# Patient Record
Sex: Female | Born: 1980 | Race: Black or African American | Hispanic: No | Smoking: Never smoker
Health system: Southern US, Community
[De-identification: ages and names within clinical notes are randomized; demographics above are authoritative.]

## PROBLEM LIST (undated history)

## (undated) DIAGNOSIS — E0590 Thyrotoxicosis, unspecified without thyrotoxic crisis or storm: Secondary | ICD-10-CM

## (undated) DIAGNOSIS — K219 Gastro-esophageal reflux disease without esophagitis: Secondary | ICD-10-CM

## (undated) DIAGNOSIS — M1990 Unspecified osteoarthritis, unspecified site: Secondary | ICD-10-CM

## (undated) DIAGNOSIS — Z202 Contact with and (suspected) exposure to infections with a predominantly sexual mode of transmission: Secondary | ICD-10-CM

## (undated) DIAGNOSIS — Z789 Other specified health status: Secondary | ICD-10-CM

## (undated) DIAGNOSIS — Z9221 Personal history of antineoplastic chemotherapy: Secondary | ICD-10-CM

## (undated) DIAGNOSIS — F419 Anxiety disorder, unspecified: Secondary | ICD-10-CM

## (undated) DIAGNOSIS — C50919 Malignant neoplasm of unspecified site of unspecified female breast: Secondary | ICD-10-CM

## (undated) DIAGNOSIS — T7840XA Allergy, unspecified, initial encounter: Secondary | ICD-10-CM

## (undated) DIAGNOSIS — Z923 Personal history of irradiation: Secondary | ICD-10-CM

## (undated) HISTORY — DX: Unspecified osteoarthritis, unspecified site: M19.90

## (undated) HISTORY — DX: Allergy, unspecified, initial encounter: T78.40XA

## (undated) HISTORY — DX: Malignant neoplasm of unspecified site of unspecified female breast: C50.919

## (undated) HISTORY — DX: Thyrotoxicosis, unspecified without thyrotoxic crisis or storm: E05.90

## (undated) HISTORY — DX: Anxiety disorder, unspecified: F41.9

## (undated) HISTORY — DX: Gastro-esophageal reflux disease without esophagitis: K21.9

## (undated) HISTORY — PX: TUBAL LIGATION: SHX77

---
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
## 1999-02-06 ENCOUNTER — Emergency Department (HOSPITAL_COMMUNITY): Admission: EM | Admit: 1999-02-06 | Discharge: 1999-02-07 | Payer: Self-pay | Admitting: *Deleted

## 1999-03-28 ENCOUNTER — Emergency Department (HOSPITAL_COMMUNITY): Admission: EM | Admit: 1999-03-28 | Discharge: 1999-03-28 | Payer: Self-pay | Admitting: Emergency Medicine

## 1999-05-09 ENCOUNTER — Emergency Department (HOSPITAL_COMMUNITY): Admission: EM | Admit: 1999-05-09 | Discharge: 1999-05-09 | Payer: Self-pay | Admitting: Emergency Medicine

## 1999-10-31 ENCOUNTER — Inpatient Hospital Stay (HOSPITAL_COMMUNITY): Admission: AD | Admit: 1999-10-31 | Discharge: 1999-10-31 | Payer: Self-pay | Admitting: Obstetrics

## 1999-11-27 ENCOUNTER — Inpatient Hospital Stay (HOSPITAL_COMMUNITY): Admission: AD | Admit: 1999-11-27 | Discharge: 1999-11-27 | Payer: Self-pay | Admitting: *Deleted

## 1999-12-09 ENCOUNTER — Ambulatory Visit (HOSPITAL_COMMUNITY): Admission: RE | Admit: 1999-12-09 | Discharge: 1999-12-09 | Payer: Self-pay | Admitting: *Deleted

## 1999-12-09 ENCOUNTER — Encounter: Payer: Self-pay | Admitting: *Deleted

## 2000-01-06 ENCOUNTER — Inpatient Hospital Stay (HOSPITAL_COMMUNITY): Admission: AD | Admit: 2000-01-06 | Discharge: 2000-01-06 | Payer: Self-pay | Admitting: *Deleted

## 2000-01-10 ENCOUNTER — Inpatient Hospital Stay (HOSPITAL_COMMUNITY): Admission: AD | Admit: 2000-01-10 | Discharge: 2000-01-10 | Payer: Self-pay | Admitting: *Deleted

## 2000-01-20 ENCOUNTER — Observation Stay (HOSPITAL_COMMUNITY): Admission: AD | Admit: 2000-01-20 | Discharge: 2000-01-21 | Payer: Self-pay | Admitting: Obstetrics & Gynecology

## 2000-02-22 ENCOUNTER — Inpatient Hospital Stay (HOSPITAL_COMMUNITY): Admission: AD | Admit: 2000-02-22 | Discharge: 2000-02-22 | Payer: Self-pay | Admitting: *Deleted

## 2000-03-26 ENCOUNTER — Inpatient Hospital Stay (HOSPITAL_COMMUNITY): Admission: AD | Admit: 2000-03-26 | Discharge: 2000-03-28 | Payer: Self-pay | Admitting: *Deleted

## 2000-08-21 ENCOUNTER — Emergency Department (HOSPITAL_COMMUNITY): Admission: EM | Admit: 2000-08-21 | Discharge: 2000-08-21 | Payer: Self-pay | Admitting: Emergency Medicine

## 2000-12-11 ENCOUNTER — Emergency Department (HOSPITAL_COMMUNITY): Admission: EM | Admit: 2000-12-11 | Discharge: 2000-12-12 | Payer: Self-pay | Admitting: Emergency Medicine

## 2001-03-06 ENCOUNTER — Encounter: Payer: Self-pay | Admitting: Emergency Medicine

## 2001-03-06 ENCOUNTER — Emergency Department (HOSPITAL_COMMUNITY): Admission: EM | Admit: 2001-03-06 | Discharge: 2001-03-06 | Payer: Self-pay | Admitting: Emergency Medicine

## 2001-06-12 HISTORY — PX: TUBAL LIGATION: SHX77

## 2001-07-10 ENCOUNTER — Inpatient Hospital Stay (HOSPITAL_COMMUNITY): Admission: AD | Admit: 2001-07-10 | Discharge: 2001-07-10 | Payer: Self-pay | Admitting: *Deleted

## 2001-08-14 ENCOUNTER — Inpatient Hospital Stay (HOSPITAL_COMMUNITY): Admission: AD | Admit: 2001-08-14 | Discharge: 2001-08-14 | Payer: Self-pay | Admitting: *Deleted

## 2001-09-11 ENCOUNTER — Ambulatory Visit (HOSPITAL_COMMUNITY): Admission: RE | Admit: 2001-09-11 | Discharge: 2001-09-11 | Payer: Self-pay | Admitting: *Deleted

## 2001-09-18 ENCOUNTER — Inpatient Hospital Stay (HOSPITAL_COMMUNITY): Admission: AD | Admit: 2001-09-18 | Discharge: 2001-09-18 | Payer: Self-pay | Admitting: Obstetrics and Gynecology

## 2001-10-29 ENCOUNTER — Encounter: Payer: Self-pay | Admitting: *Deleted

## 2001-10-29 ENCOUNTER — Observation Stay (HOSPITAL_COMMUNITY): Admission: AD | Admit: 2001-10-29 | Discharge: 2001-10-30 | Payer: Self-pay | Admitting: *Deleted

## 2001-10-30 ENCOUNTER — Encounter: Payer: Self-pay | Admitting: Family Medicine

## 2001-11-19 ENCOUNTER — Inpatient Hospital Stay (HOSPITAL_COMMUNITY): Admission: AD | Admit: 2001-11-19 | Discharge: 2001-11-19 | Payer: Self-pay | Admitting: *Deleted

## 2001-11-28 ENCOUNTER — Inpatient Hospital Stay (HOSPITAL_COMMUNITY): Admission: AD | Admit: 2001-11-28 | Discharge: 2001-11-28 | Payer: Self-pay | Admitting: *Deleted

## 2001-12-09 ENCOUNTER — Inpatient Hospital Stay (HOSPITAL_COMMUNITY): Admission: AD | Admit: 2001-12-09 | Discharge: 2001-12-09 | Payer: Self-pay | Admitting: Obstetrics and Gynecology

## 2002-01-02 ENCOUNTER — Inpatient Hospital Stay (HOSPITAL_COMMUNITY): Admission: AD | Admit: 2002-01-02 | Discharge: 2002-01-02 | Payer: Self-pay | Admitting: Obstetrics and Gynecology

## 2002-01-12 ENCOUNTER — Inpatient Hospital Stay (HOSPITAL_COMMUNITY): Admission: AD | Admit: 2002-01-12 | Discharge: 2002-01-12 | Payer: Self-pay | Admitting: *Deleted

## 2002-01-24 ENCOUNTER — Inpatient Hospital Stay (HOSPITAL_COMMUNITY): Admission: AD | Admit: 2002-01-24 | Discharge: 2002-01-24 | Payer: Self-pay | Admitting: *Deleted

## 2002-01-28 ENCOUNTER — Inpatient Hospital Stay (HOSPITAL_COMMUNITY): Admission: AD | Admit: 2002-01-28 | Discharge: 2002-01-30 | Payer: Self-pay | Admitting: *Deleted

## 2002-01-28 ENCOUNTER — Encounter (INDEPENDENT_AMBULATORY_CARE_PROVIDER_SITE_OTHER): Payer: Self-pay | Admitting: Specialist

## 2002-06-10 ENCOUNTER — Emergency Department (HOSPITAL_COMMUNITY): Admission: EM | Admit: 2002-06-10 | Discharge: 2002-06-10 | Payer: Self-pay | Admitting: Emergency Medicine

## 2002-06-10 ENCOUNTER — Encounter: Payer: Self-pay | Admitting: Emergency Medicine

## 2002-06-27 ENCOUNTER — Emergency Department (HOSPITAL_COMMUNITY): Admission: EM | Admit: 2002-06-27 | Discharge: 2002-06-27 | Payer: Self-pay | Admitting: Emergency Medicine

## 2003-01-05 ENCOUNTER — Inpatient Hospital Stay (HOSPITAL_COMMUNITY): Admission: AD | Admit: 2003-01-05 | Discharge: 2003-01-05 | Payer: Self-pay | Admitting: Obstetrics & Gynecology

## 2003-05-28 ENCOUNTER — Inpatient Hospital Stay (HOSPITAL_COMMUNITY): Admission: AD | Admit: 2003-05-28 | Discharge: 2003-05-28 | Payer: Self-pay | Admitting: Nephrology

## 2003-06-29 ENCOUNTER — Emergency Department (HOSPITAL_COMMUNITY): Admission: EM | Admit: 2003-06-29 | Discharge: 2003-06-29 | Payer: Self-pay | Admitting: Emergency Medicine

## 2003-07-31 ENCOUNTER — Emergency Department (HOSPITAL_COMMUNITY): Admission: EM | Admit: 2003-07-31 | Discharge: 2003-07-31 | Payer: Self-pay | Admitting: Emergency Medicine

## 2003-09-27 ENCOUNTER — Emergency Department (HOSPITAL_COMMUNITY): Admission: EM | Admit: 2003-09-27 | Discharge: 2003-09-27 | Payer: Self-pay | Admitting: Emergency Medicine

## 2003-12-16 ENCOUNTER — Emergency Department (HOSPITAL_COMMUNITY): Admission: EM | Admit: 2003-12-16 | Discharge: 2003-12-16 | Payer: Self-pay | Admitting: Emergency Medicine

## 2004-03-15 ENCOUNTER — Emergency Department (HOSPITAL_COMMUNITY): Admission: EM | Admit: 2004-03-15 | Discharge: 2004-03-15 | Payer: Self-pay | Admitting: Emergency Medicine

## 2004-07-10 ENCOUNTER — Emergency Department (HOSPITAL_COMMUNITY): Admission: EM | Admit: 2004-07-10 | Discharge: 2004-07-10 | Payer: Self-pay | Admitting: Family Medicine

## 2004-08-06 ENCOUNTER — Emergency Department (HOSPITAL_COMMUNITY): Admission: EM | Admit: 2004-08-06 | Discharge: 2004-08-06 | Payer: Self-pay | Admitting: Emergency Medicine

## 2005-01-10 ENCOUNTER — Emergency Department (HOSPITAL_COMMUNITY): Admission: EM | Admit: 2005-01-10 | Discharge: 2005-01-10 | Payer: Self-pay | Admitting: Emergency Medicine

## 2005-05-31 ENCOUNTER — Emergency Department (HOSPITAL_COMMUNITY): Admission: EM | Admit: 2005-05-31 | Discharge: 2005-05-31 | Payer: Self-pay | Admitting: Emergency Medicine

## 2006-01-20 ENCOUNTER — Emergency Department (HOSPITAL_COMMUNITY): Admission: EM | Admit: 2006-01-20 | Discharge: 2006-01-20 | Payer: Self-pay | Admitting: Emergency Medicine

## 2006-06-25 ENCOUNTER — Emergency Department (HOSPITAL_COMMUNITY): Admission: EM | Admit: 2006-06-25 | Discharge: 2006-06-25 | Payer: Self-pay | Admitting: Family Medicine

## 2006-10-03 ENCOUNTER — Emergency Department (HOSPITAL_COMMUNITY): Admission: EM | Admit: 2006-10-03 | Discharge: 2006-10-03 | Payer: Self-pay | Admitting: Family Medicine

## 2007-09-30 ENCOUNTER — Emergency Department (HOSPITAL_COMMUNITY): Admission: EM | Admit: 2007-09-30 | Discharge: 2007-09-30 | Payer: Self-pay | Admitting: Emergency Medicine

## 2007-10-14 ENCOUNTER — Emergency Department (HOSPITAL_COMMUNITY): Admission: EM | Admit: 2007-10-14 | Discharge: 2007-10-14 | Payer: Self-pay | Admitting: Emergency Medicine

## 2007-12-01 ENCOUNTER — Emergency Department (HOSPITAL_COMMUNITY): Admission: EM | Admit: 2007-12-01 | Discharge: 2007-12-01 | Payer: Self-pay | Admitting: Emergency Medicine

## 2007-12-23 ENCOUNTER — Inpatient Hospital Stay (HOSPITAL_COMMUNITY): Admission: EM | Admit: 2007-12-23 | Discharge: 2007-12-26 | Payer: Self-pay | Admitting: Emergency Medicine

## 2008-03-02 ENCOUNTER — Emergency Department (HOSPITAL_COMMUNITY): Admission: EM | Admit: 2008-03-02 | Discharge: 2008-03-02 | Payer: Self-pay | Admitting: Emergency Medicine

## 2008-07-17 ENCOUNTER — Emergency Department (HOSPITAL_COMMUNITY): Admission: EM | Admit: 2008-07-17 | Discharge: 2008-07-17 | Payer: Self-pay | Admitting: Emergency Medicine

## 2008-08-05 ENCOUNTER — Emergency Department (HOSPITAL_COMMUNITY): Admission: EM | Admit: 2008-08-05 | Discharge: 2008-08-05 | Payer: Self-pay | Admitting: Family Medicine

## 2008-08-10 ENCOUNTER — Emergency Department (HOSPITAL_COMMUNITY): Admission: EM | Admit: 2008-08-10 | Discharge: 2008-08-10 | Payer: Self-pay | Admitting: Emergency Medicine

## 2008-11-12 ENCOUNTER — Emergency Department (HOSPITAL_COMMUNITY): Admission: EM | Admit: 2008-11-12 | Discharge: 2008-11-12 | Payer: Self-pay | Admitting: Emergency Medicine

## 2008-12-22 ENCOUNTER — Emergency Department (HOSPITAL_COMMUNITY): Admission: EM | Admit: 2008-12-22 | Discharge: 2008-12-22 | Payer: Self-pay | Admitting: Emergency Medicine

## 2009-03-16 ENCOUNTER — Emergency Department (HOSPITAL_COMMUNITY): Admission: EM | Admit: 2009-03-16 | Discharge: 2009-03-16 | Payer: Self-pay | Admitting: Family Medicine

## 2009-05-14 ENCOUNTER — Emergency Department (HOSPITAL_COMMUNITY): Admission: EM | Admit: 2009-05-14 | Discharge: 2009-05-14 | Payer: Self-pay | Admitting: Emergency Medicine

## 2009-05-17 ENCOUNTER — Emergency Department (HOSPITAL_COMMUNITY): Admission: EM | Admit: 2009-05-17 | Discharge: 2009-05-17 | Payer: Self-pay | Admitting: Emergency Medicine

## 2009-06-02 ENCOUNTER — Emergency Department (HOSPITAL_COMMUNITY): Admission: EM | Admit: 2009-06-02 | Discharge: 2009-06-02 | Payer: Self-pay | Admitting: Emergency Medicine

## 2009-06-21 ENCOUNTER — Emergency Department (HOSPITAL_COMMUNITY): Admission: EM | Admit: 2009-06-21 | Discharge: 2009-06-21 | Payer: Self-pay | Admitting: Emergency Medicine

## 2009-07-10 ENCOUNTER — Emergency Department (HOSPITAL_COMMUNITY): Admission: EM | Admit: 2009-07-10 | Discharge: 2009-07-11 | Payer: Self-pay | Admitting: Emergency Medicine

## 2009-10-05 ENCOUNTER — Emergency Department (HOSPITAL_COMMUNITY): Admission: EM | Admit: 2009-10-05 | Discharge: 2009-10-05 | Payer: Self-pay | Admitting: Family Medicine

## 2009-10-18 ENCOUNTER — Emergency Department (HOSPITAL_COMMUNITY): Admission: EM | Admit: 2009-10-18 | Discharge: 2009-10-18 | Payer: Self-pay | Admitting: Emergency Medicine

## 2010-02-27 ENCOUNTER — Emergency Department (HOSPITAL_COMMUNITY): Admission: EM | Admit: 2010-02-27 | Discharge: 2010-02-28 | Payer: Self-pay | Admitting: Emergency Medicine

## 2010-06-16 ENCOUNTER — Emergency Department (HOSPITAL_COMMUNITY)
Admission: EM | Admit: 2010-06-16 | Discharge: 2010-06-16 | Payer: Self-pay | Source: Home / Self Care | Admitting: Family Medicine

## 2010-08-02 ENCOUNTER — Emergency Department (HOSPITAL_COMMUNITY)
Admission: EM | Admit: 2010-08-02 | Discharge: 2010-08-02 | Disposition: A | Discharge status: Home / Self Care | Payer: Medicaid Other | Attending: Emergency Medicine | Admitting: Emergency Medicine

## 2010-08-02 ENCOUNTER — Emergency Department (HOSPITAL_COMMUNITY): Payer: Medicaid Other

## 2010-08-02 DIAGNOSIS — K5289 Other specified noninfective gastroenteritis and colitis: Secondary | ICD-10-CM | POA: Insufficient documentation

## 2010-08-02 DIAGNOSIS — R0682 Tachypnea, not elsewhere classified: Secondary | ICD-10-CM | POA: Insufficient documentation

## 2010-08-02 DIAGNOSIS — R109 Unspecified abdominal pain: Secondary | ICD-10-CM | POA: Insufficient documentation

## 2010-08-02 LAB — COMPREHENSIVE METABOLIC PANEL
ALT: 13 U/L (ref 0–35)
AST: 24 U/L (ref 0–37)
Albumin: 4 g/dL (ref 3.5–5.2)
Alkaline Phosphatase: 93 U/L (ref 39–117)
BUN: 13 mg/dL (ref 6–23)
CO2: 22 mEq/L (ref 19–32)
Calcium: 8.9 mg/dL (ref 8.4–10.5)
Chloride: 106 mEq/L (ref 96–112)
Creatinine, Ser: 0.68 mg/dL (ref 0.4–1.2)
GFR calc Af Amer: >60 mL/min (ref >60)
GFR calc non Af Amer: >60 mL/min (ref >60)
Glucose, Bld: 120 mg/dL — ABNORMAL HIGH (ref 70–99)
Potassium: 3.3 mEq/L — ABNORMAL LOW (ref 3.5–5.1)
Sodium: 134 mEq/L — ABNORMAL LOW (ref 135–145)
Total Bilirubin: 0.9 mg/dL (ref 0.3–1.2)
Total Protein: 7.9 g/dL (ref 6.0–8.3)

## 2010-08-02 LAB — URINALYSIS, ROUTINE W REFLEX MICROSCOPIC
Bilirubin Urine: NEGATIVE
Hgb urine dipstick: NEGATIVE
Ketones, ur: NEGATIVE mg/dL
Nitrite: NEGATIVE
Protein, ur: NEGATIVE mg/dL
Specific Gravity, Urine: 1.023 (ref 1.005–1.030)
Urine Glucose, Fasting: NEGATIVE mg/dL
Urobilinogen, UA: 0.2 mg/dL (ref 0.0–1.0)
pH: 7 (ref 5.0–8.0)

## 2010-08-02 LAB — DIFFERENTIAL
Basophils Absolute: 0 10*3/uL (ref 0.0–0.1)
Basophils Relative: 0 % (ref 0–1)
Eosinophils Absolute: 0 10*3/uL (ref 0.0–0.7)
Eosinophils Relative: 0 % (ref 0–5)
Lymphocytes Relative: 7 % — ABNORMAL LOW (ref 12–46)
Lymphs Abs: 0.7 10*3/uL (ref 0.7–4.0)
Monocytes Absolute: 0.4 10*3/uL (ref 0.1–1.0)
Monocytes Relative: 4 % (ref 3–12)
Neutro Abs: 8 10*3/uL — ABNORMAL HIGH (ref 1.7–7.7)
Neutrophils Relative %: 88 % — ABNORMAL HIGH (ref 43–77)

## 2010-08-02 LAB — CBC
HCT: 41 % (ref 36.0–46.0)
Hemoglobin: 13.8 g/dL (ref 12.0–15.0)
MCH: 28.3 pg (ref 26.0–34.0)
MCHC: 33.7 g/dL (ref 30.0–36.0)
MCV: 84.2 fL (ref 78.0–100.0)
Platelets: 184 10*3/uL (ref 150–400)
RBC: 4.87 MIL/uL (ref 3.87–5.11)
RDW: 13.5 % (ref 11.5–15.5)
WBC: 9.1 10*3/uL (ref 4.0–10.5)

## 2010-08-02 LAB — URINE MICROSCOPIC-ADD ON

## 2010-08-02 LAB — POCT PREGNANCY, URINE: Preg Test, Ur: NEGATIVE

## 2010-08-02 LAB — LIPASE, BLOOD: Lipase: 19 U/L (ref 11–59)

## 2010-08-02 MED ORDER — IOHEXOL 300 MG/ML  SOLN
100.0000 mL | Freq: Once | INTRAMUSCULAR | Status: AC | PRN
  Administered 2010-08-02: 100 mL via INTRAVENOUS

## 2010-08-25 LAB — POCT CARDIAC MARKERS
CKMB, poc: <1 ng/mL — ABNORMAL LOW (ref 1.0–8.0)
Myoglobin, poc: 22.3 ng/mL (ref 12–200)
Troponin i, poc: <0.05 ng/mL (ref 0.00–0.09)

## 2010-09-18 LAB — URINALYSIS, ROUTINE W REFLEX MICROSCOPIC
Bilirubin Urine: NEGATIVE
Glucose, UA: NEGATIVE mg/dL
Ketones, ur: NEGATIVE mg/dL
Leukocytes, UA: NEGATIVE
Nitrite: NEGATIVE
Protein, ur: NEGATIVE mg/dL
Specific Gravity, Urine: 1.028 (ref 1.005–1.030)
Urobilinogen, UA: 1 mg/dL (ref 0.0–1.0)
pH: 6.5 (ref 5.0–8.0)

## 2010-09-18 LAB — URINE MICROSCOPIC-ADD ON

## 2010-09-18 LAB — D-DIMER, QUANTITATIVE: D-Dimer, Quant: 0.37 ug/mL-FEU (ref 0.00–0.48)

## 2010-09-19 LAB — DIFFERENTIAL
Basophils Absolute: 0 10*3/uL (ref 0.0–0.1)
Basophils Relative: 1 % (ref 0–1)
Eosinophils Absolute: 0.3 10*3/uL (ref 0.0–0.7)
Eosinophils Relative: 6 % — ABNORMAL HIGH (ref 0–5)
Lymphocytes Relative: 33 % (ref 12–46)
Lymphs Abs: 1.8 10*3/uL (ref 0.7–4.0)
Monocytes Absolute: 0.5 10*3/uL (ref 0.1–1.0)
Monocytes Relative: 10 % (ref 3–12)
Neutro Abs: 2.7 10*3/uL (ref 1.7–7.7)
Neutrophils Relative %: 50 % (ref 43–77)

## 2010-09-19 LAB — POCT I-STAT, CHEM 8
BUN: 8 mg/dL (ref 6–23)
Calcium, Ion: 1.17 mmol/L (ref 1.12–1.32)
Chloride: 103 mEq/L (ref 96–112)
Creatinine, Ser: 0.9 mg/dL (ref 0.4–1.2)
Glucose, Bld: 80 mg/dL (ref 70–99)
HCT: 39 % (ref 36.0–46.0)
Hemoglobin: 13.3 g/dL (ref 12.0–15.0)
Potassium: 3.2 mEq/L — ABNORMAL LOW (ref 3.5–5.1)
Sodium: 141 mEq/L (ref 135–145)
TCO2: 26 mmol/L (ref 0–100)

## 2010-09-19 LAB — WET PREP, GENITAL
Trich, Wet Prep: NONE SEEN
Yeast Wet Prep HPF POC: NONE SEEN

## 2010-09-19 LAB — GC/CHLAMYDIA PROBE AMP, GENITAL
Chlamydia, DNA Probe: NEGATIVE
GC Probe Amp, Genital: NEGATIVE

## 2010-09-19 LAB — URINALYSIS, ROUTINE W REFLEX MICROSCOPIC
Bilirubin Urine: NEGATIVE
Glucose, UA: NEGATIVE mg/dL
Hgb urine dipstick: NEGATIVE
Ketones, ur: NEGATIVE mg/dL
Nitrite: NEGATIVE
Protein, ur: NEGATIVE mg/dL
Specific Gravity, Urine: 1.016 (ref 1.005–1.030)
Urobilinogen, UA: 1 mg/dL (ref 0.0–1.0)
pH: 8 (ref 5.0–8.0)

## 2010-09-19 LAB — CBC
HCT: 37.4 % (ref 36.0–46.0)
Hemoglobin: 12.5 g/dL (ref 12.0–15.0)
MCHC: 33.5 g/dL (ref 30.0–36.0)
MCV: 89.7 fL (ref 78.0–100.0)
Platelets: 182 10*3/uL (ref 150–400)
RBC: 4.17 MIL/uL (ref 3.87–5.11)
RDW: 13 % (ref 11.5–15.5)
WBC: 5.3 10*3/uL (ref 4.0–10.5)

## 2010-09-22 LAB — DIFFERENTIAL
Basophils Absolute: 0 10*3/uL (ref 0.0–0.1)
Basophils Relative: 1 % (ref 0–1)
Eosinophils Absolute: 0.2 10*3/uL (ref 0.0–0.7)
Eosinophils Relative: 4 % (ref 0–5)
Lymphocytes Relative: 29 % (ref 12–46)
Lymphs Abs: 1.3 10*3/uL (ref 0.7–4.0)
Monocytes Absolute: 0.7 10*3/uL (ref 0.1–1.0)
Monocytes Relative: 16 % — ABNORMAL HIGH (ref 3–12)
Neutro Abs: 2.3 10*3/uL (ref 1.7–7.7)
Neutrophils Relative %: 51 % (ref 43–77)

## 2010-09-22 LAB — BASIC METABOLIC PANEL
BUN: 5 mg/dL — ABNORMAL LOW (ref 6–23)
CO2: 26 mEq/L (ref 19–32)
Calcium: 8.9 mg/dL (ref 8.4–10.5)
Chloride: 106 mEq/L (ref 96–112)
Creatinine, Ser: 0.73 mg/dL (ref 0.4–1.2)
GFR calc Af Amer: >60 mL/min (ref >60)
GFR calc non Af Amer: >60 mL/min (ref >60)
Glucose, Bld: 96 mg/dL (ref 70–99)
Potassium: 3.2 mEq/L — ABNORMAL LOW (ref 3.5–5.1)
Sodium: 138 mEq/L (ref 135–145)

## 2010-09-22 LAB — CBC
HCT: 40.9 % (ref 36.0–46.0)
Hemoglobin: 13.7 g/dL (ref 12.0–15.0)
MCHC: 33.5 g/dL (ref 30.0–36.0)
MCV: 88.9 fL (ref 78.0–100.0)
Platelets: 202 10*3/uL (ref 150–400)
RBC: 4.6 MIL/uL (ref 3.87–5.11)
RDW: 13.3 % (ref 11.5–15.5)
WBC: 4.5 10*3/uL (ref 4.0–10.5)

## 2010-09-22 LAB — RETICULOCYTES
RBC.: 4.58 MIL/uL (ref 3.87–5.11)
Retic Count, Absolute: 41.2 10*3/uL (ref 19.0–186.0)
Retic Ct Pct: 0.9 % (ref 0.4–3.1)

## 2010-09-27 LAB — POCT RAPID STREP A (OFFICE): Streptococcus, Group A Screen (Direct): NEGATIVE

## 2010-10-25 NOTE — Discharge Summary (Signed)
NAMEBAYLEA, Laurie Bell            ACCOUNT NO.:  [id number]   MEDICAL RECORD NO.:  [id number]          PATIENT TYPE:  INP   LOCATION:  1517                         FAC[facility]   PHYSICIAN:  Herbie Saxon, MDDATE OF BIRTH:  07-17-1980   DATE OF ADMISSION:  12/23/2007  DATE OF DISCHARGE:  12/26/2007                               DISCHARGE SUMMARY   DISCHARGE DIAGNOSES:  1. Acute pyelonephritis on the left.  2. Acute gastroenteritis, improved.  3. Hypokalemia, resolved.  4. Sickle cell disease.  5. Leukocytosis, resolved.  6. thrombocytopenia.   RADIOLOGY:  The CT of the abdomen and pelvis of December 24, 2007 shows left  pyelonephritis.  There is no renal abscess.  Tiny nodule in the right  medulla, nonspecific small amount of free pelvic fluid.  Renal  ultrasound of December 24, 2007 shows normal renal parenchyma, no  hydronephrosis.   HOSPITAL COURSE:  This 30 year old African American female presented to  the emergency room on December 23, 2007 complaining of abdominal pain,  diarrhea and vomiting.  The vomiting was intractable.  Her urine  microscopy showed 20-50 white blood cells, large leukocytes, with  leukocytosis on admission.  The patient was started on IV Cipro and  Flagyl, and her diarrhea has resolved.  The patient is no longer  nauseous.  She still has bilateral flank tenderness periflank pain that  is also improving on p.r.n. analgesia.  The patient had hypokalemia,  which has been supplemented.  Blood culture so far is negative.  The  patient  has been assigned to follow up Dr. Lovell Sheehan as an outpatient.   DISPOSITION:  Home.   She will follow up with Dr. Della Goo in 5-7 days to have a  repeat urinalysis and repeat urine cultures if necessary at that time.  The patient has been counseled on need to comply strictly with the  antibiotic coverage for the next 1 week.   MEDICATIONS ON DISCHARGE:  Cipro 500 mg b.i.d. 1 week, Flagyl 500 mg  b.i.d. 1 week,  Oxy IR 5 mg q.6 h p.r.n. for pain, Tylenol 650 mg q.6 h  p.r.n. fever.   On examination today she is a young lady in no acute distress.  Temperature is 98, the pulse is 80, respiratory rate is 20, blood  pressure 104/73.  Pupils equal, reactive to light and accommodation.  Head is atraumatic, normocephalic.  Mucous membranes are moist.  Oropharynx and nasopharynx are clear.  Neck is supple.  No jugular  venous distension.  Her chest is clinically  clear.  Abdomen is soft.  There is mild renal angle tenderness on right instead  of left.  No organomegaly.  She is alert, oriented to self, place, and person.  Cranial nerves 2-12  are intact.  Deep tendon reflexes +2 globally.  Power 5 globally.  Sensation normal.  Peripheral pulses are present.  No pedal edema.   LAB TESTS:  Show the hemoglobin is 36.  Chemistry:  Sodium 140,  potassium 3.8, chloride 108, bicarbonate 26, glucose 96, BUN 3,  creatinine 0.74.  WBC is 8, platelet count 169.  Herbie Saxon, MD  Electronically Signed     MIO/MEDQ  D:  12/26/2007  T:  12/26/2007  Job:  [id number]

## 2010-10-25 NOTE — H&P (Signed)
NAMEANSHU, Laurie Bell            ACCOUNT NO.:  [id number]   MEDICAL RECORD NO.:  [id number]          PATIENT TYPE:  EMS   LOCATION:  ED                           FACILITY[facility]  [facility]   PHYSICIAN:  Hind I Elsaid, MD      DATE OF BIRTH:  July 10, 1980   DATE OF ADMISSION:  12/23/2007  DATE OF DISCHARGE:                              HISTORY & PHYSICAL   CHIEF COMPLAINT:  Abdominal pain, diarrhea, and vomiting.   HISTORY OF PRESENT ILLNESS:  This is a 30 year old African American  female who has a history of sickle cell.  The patient had been in her  regular state of health until 4 days ago when she started having lower  abdominal pain associated with diarrhea.  The diarrhea is uncountable  number, mainly watery.  The patient denies any bloody diarrhea or mucus.  Condition also associated with nausea and vomiting.  Vomiting was  intractable.  The patient denies any bloody vomitus.  Condition  associated with generalized body pain.  The patient also noticed, in  effort to control her urine.  Condition also associated with episodes of  dry cough and sore throat.  The patient admitted also headache and  denies body weakness, muscle pain.   PAST MEDICAL HISTORY:  Significant for sickle cell disease.   ALLERGIES:  No known drug allergies.   MEDICATIONS:  None.   PAST SURGICAL HISTORY:  Status post bilateral tubal ligation.   SOCIAL HISTORY:  The patient now works at [employer] and [employer].  She works as a Lawyer.  She has 2 children.  Denies any  smoking.  Denies any IV drug abuse and denies any alcohol.   REVIEW OF SYSTEMS:  As per HPI.   EXAMINATION:  VITAL SIGNS:  Temperature 101.6, blood pressure 101/72,  pulse rate 122, respiratory rate 24, satting 96% on room air.  HEENT:  The patient laying comfortably in bed.  No respiratory distress  or shortness of breath.  Mucosa moist.  There is no evidence of tonsillar hypertrophy or exudate.  NECK:  Supple.  No JVD.  No  lymphadenopathy.  HEART:  S1, S2.  Tachycardic.  LUNGS:  Normal effort with breathing with equal air entry.  ABDOMEN:  Soft.  Nondistended.  Bowel sounds positive.  Tenderness at  the left and upper right quadrant.  There is evidence of right  costovertebral angle tenderness.  LOWER EXTREMITIES:  No lower edema.  Peripheral pulses intact.   Urine microscopy showed 20-50 white blood cells with large leukocytes,  hemoglobin 15.6, hematocrit 46, platelet 139, potassium 3.3, chloride  103, glucose 105, BUN 10, creatinine 0.9.   ASSESSMENT AND PLAN:  1. Acute gastroenteritis.  2. Flu-like symptoms.  3. Acute pyelonephritis.  4. Sickle cell.  5. Hyponatremia.   PLAN:  Admission to the hospital.  Keep the patient on clear liquid  diet.  Start the patient on Cipro and Flagyl.  Stools for ova and  parasite and clostridium difficile.  Will continue with droplet  precautions.  Will get ultrasound of the kidney and bladder.  DVT and GI  prophylaxis.  Hind Bosie Helper, MD  Electronically Signed     HIE/MEDQ  D:  12/23/2007  T:  12/23/2007  Job:  [id number]

## 2010-10-28 NOTE — Discharge Summary (Signed)
[facility]  Patient:    Laurie Bell, Laurie Bell Visit Number: [id number] MRN: [id number]          Service Type: OBS Location: 910D [id number] [id number] Attending Physician:  Michaelle Copas Dictated by:   Vear Clock, M.D. Admit Date:  10/29/2001 Discharge Date: 10/30/2001   CC:         [facility]   Discharge Summary  DISCHARGE DIAGNOSES: 1. Pregnancy at 24-6/7 weeks. 2. Status post fall.  DISCHARGE MEDICATIONS: 1. Ibuprofen 600 mg p.o. q.8h. x6 doses. 2. Flagyl 500 mg p.o. b.i.d. x6 days.  FOLLOWUP:  The patient is to follow up at her previously scheduled appointment at [facility] on Nov 06, 2001 at 1 p.m.  PROCEDURES AND DIAGNOSTIC STUDIES:  OB ultrasound on 10/29/01 and 10/30/01, both showing a single intrauterine pregnancy with fetal movement negative for fetal breathing, cephalic presentation, anterior placenta with no previa, grade I without abruption seen, normal AFI.  Agrees with prior dating.  Prior LVEIF no longer present.  Maternal cervix 3.7 and 4.1 cm transabdominally.  HISTORY OF PRESENT ILLNESS:  In short, this is a 30 year old, G2, P1 at 24-6/7 weeks who presents status post a fall on day prior to admission complaining of lower abdominal pain with questionable contractions.  The patient had been seen at [facility] on the day of admission, given Diflucan for a yeast infection.  PRENATAL LABORATORY DATA:  O+, antibody negative, rubella immune, hepatitis B surface antigen negative, RPR negative, HIV negative, GC negative, chlamydia negative.  The patient was admitted for a rule out preterm labor and wet prep with clue cells and occasional yeast.  HOSPITAL COURSE:  The patient continued to complain of right hip and right lower quadrant pain which was apparently was musculoskeletal in origin.  Her abdomen remained soft.  She had no vaginal bleeding.  Followup DIC panels and ultrasounds were unchanged.   Consequently, the patient was discharged to home with reassurance that she did not have a placental separation.  Additionally, the patient is O+, so she does not have a need for RhoGAM.  The patient was found to have intermittent contractions which were reassuring, however, secondary to the patients pain and her ongoing contractions of approximately one per hour, the patient was sent home with ibuprofen for a total of six doses.  DISCHARGE LABORATORY DATA:  Platelets 190.  PT 13.3, INR 1.0, PTT 29. Fibrinogen 337.  D-dimer 0.98.  Group B strep negative.  White blood cell count 10.2, hemoglobin 10.2, and platelets 210.  GC and claymidia were pending prior to discharge.  The patient was discharged to home after 23 hour observation without further incident. Dictated by:   Vear Clock, M.D. Attending Physician:  Michaelle Copas DD:  10/30/01 TD:  11/02/01 Job: [id number]

## 2010-10-28 NOTE — Op Note (Signed)
   NAME:  Laurie Bell, Laurie Bell                      ACCOUNT NO.:  [id number]   MEDICAL RECORD NO.:  [id number]                   PATIENT TYPE:  INP   LOCATION:  9142                                 FACILITY:  [facility]   PHYSICIAN:  Phil D. Okey Dupre, M.D.                  DATE OF BIRTH:  20-Oct-1980   DATE OF PROCEDURE:  01/29/2002  DATE OF DISCHARGE:  01/30/2002                                 OPERATIVE REPORT   PREOPERATIVE DIAGNOSIS:  Voluntary sterilization for multiparity.   POSTOPERATIVE DIAGNOSIS:  Voluntary sterilization for multiparity.   PROCEDURE:  Bilateral tubal ligation, modified Pomeroy sterilization.   SURGEON:  Javier Glazier. Rose, M.D.   DESCRIPTION OF PROCEDURE:  Under satisfactory epidural anesthesia with the  addition of general, the patient in the dorsal supine position, the abdomen  was prepped and draped in the usual sterile manner and entered through a  semilunar subumbilical incision situated 1 cm below the umbilicus and  extending for a length of 5 cm.  The abdomen was entered by layers.  The  abdomen was entered by layers and on entering the peritoneal cavity, the  right fallopian tube was grasped in the midpoint and a hemostat placed  through the meso beneath the tube.  A #1 plain chromic suture was drawn  through and tied around the distal and proximal end of the tube forming the  loop above the tie of approximately 2 cm.  A second tie was placed over the  aforementioned, and the section of tube above the ties was excised, and the  ends of the tube coagulated with hot cautery.  The same was carried out with  regard to the left fallopian tube, and the incision closed, with continuous  running alternating locked 0 Vicryl on an atraumatic needle to repair the  fascia, the subcutaneous and then as a subcuticular stitch.  Dry sterile  dressing was applied.  The patient tolerated the procedure well and was  transferred to the recovery room in satisfactory condition.                                          Phil D. Okey Dupre, M.D.    PDR/MEDQ  D:  01/29/2002  T:  01/30/2002  Job:  [id number]

## 2010-11-10 ENCOUNTER — Inpatient Hospital Stay (INDEPENDENT_AMBULATORY_CARE_PROVIDER_SITE_OTHER)
Admission: RE | Admit: 2010-11-10 | Discharge: 2010-11-10 | Disposition: A | Discharge status: Home / Self Care | Payer: Medicaid Other | Source: Ambulatory Visit | Attending: Family Medicine | Admitting: Family Medicine

## 2010-11-10 DIAGNOSIS — N760 Acute vaginitis: Secondary | ICD-10-CM

## 2010-11-10 LAB — POCT URINALYSIS DIP (DEVICE)
Bilirubin Urine: NEGATIVE
Glucose, UA: NEGATIVE mg/dL
Ketones, ur: NEGATIVE mg/dL
Nitrite: NEGATIVE
Protein, ur: 30 mg/dL — AB
Specific Gravity, Urine: 1.025 (ref 1.005–1.030)
Urobilinogen, UA: 1 mg/dL (ref 0.0–1.0)
pH: 7 (ref 5.0–8.0)

## 2010-11-10 LAB — WET PREP, GENITAL: Trich, Wet Prep: NONE SEEN

## 2010-11-10 LAB — POCT PREGNANCY, URINE: Preg Test, Ur: NEGATIVE

## 2010-11-11 LAB — GC/CHLAMYDIA PROBE AMP, GENITAL
Chlamydia, DNA Probe: NEGATIVE
GC Probe Amp, Genital: NEGATIVE

## 2011-02-24 ENCOUNTER — Inpatient Hospital Stay (HOSPITAL_COMMUNITY): Payer: Medicaid Other

## 2011-02-24 ENCOUNTER — Inpatient Hospital Stay (HOSPITAL_COMMUNITY)
Admission: AD | Admit: 2011-02-24 | Discharge: 2011-02-24 | Disposition: A | Discharge status: Home / Self Care | Payer: Medicaid Other | Source: Ambulatory Visit | Attending: Obstetrics and Gynecology | Admitting: Obstetrics and Gynecology

## 2011-02-24 ENCOUNTER — Encounter (HOSPITAL_COMMUNITY): Payer: Self-pay | Admitting: *Deleted

## 2011-02-24 DIAGNOSIS — N925 Other specified irregular menstruation: Secondary | ICD-10-CM

## 2011-02-24 DIAGNOSIS — N949 Unspecified condition associated with female genital organs and menstrual cycle: Secondary | ICD-10-CM

## 2011-02-24 DIAGNOSIS — N938 Other specified abnormal uterine and vaginal bleeding: Secondary | ICD-10-CM

## 2011-02-24 HISTORY — DX: Other specified health status: Z78.9

## 2011-02-24 LAB — URINALYSIS, ROUTINE W REFLEX MICROSCOPIC
Glucose, UA: NEGATIVE mg/dL
Ketones, ur: 15 mg/dL — AB
Nitrite: NEGATIVE
Protein, ur: 100 mg/dL — AB
Specific Gravity, Urine: 1.025 (ref 1.005–1.030)
Urobilinogen, UA: 1 mg/dL (ref 0.0–1.0)
pH: 7.5 (ref 5.0–8.0)

## 2011-02-24 LAB — CBC
HCT: 34.8 % — ABNORMAL LOW (ref 36.0–46.0)
Hemoglobin: 11.7 g/dL — ABNORMAL LOW (ref 12.0–15.0)
MCH: 29 pg (ref 26.0–34.0)
MCHC: 33.6 g/dL (ref 30.0–36.0)
MCV: 86.1 fL (ref 78.0–100.0)
Platelets: 235 10*3/uL (ref 150–400)
RBC: 4.04 MIL/uL (ref 3.87–5.11)
RDW: 13.4 % (ref 11.5–15.5)
WBC: 6.1 10*3/uL (ref 4.0–10.5)

## 2011-02-24 LAB — WET PREP, GENITAL
Clue Cells Wet Prep HPF POC: NONE SEEN
Trich, Wet Prep: NONE SEEN
Yeast Wet Prep HPF POC: NONE SEEN

## 2011-02-24 LAB — URINE MICROSCOPIC-ADD ON

## 2011-02-24 LAB — POCT PREGNANCY, URINE: Preg Test, Ur: NEGATIVE

## 2011-02-24 LAB — TSH: TSH: 0.129 u[IU]/mL — ABNORMAL LOW (ref 0.350–4.500)

## 2011-02-24 NOTE — Progress Notes (Signed)
Pt reports off and on vaginal bleeding x1 month.  Now reports lower abdominal cramping and tightness.  States today she is having dark red bleeding.  Has used 9 pads.  States she has been bleeding through clothes past 6 days.  Was seen at doctors office and was given provera, states it stopped for 4 days, but then it came back.

## 2011-02-24 NOTE — Progress Notes (Signed)
Started bleeding dark brown x 1 months, now heavy passing clots, going through 14 pads a day., lower abdominal cramping, denies recent change in birth control.

## 2011-02-24 NOTE — ED Provider Notes (Addendum)
History     Chief Complaint  Patient presents with  . Vaginal Bleeding   HPI Patient seen for vaginal bleeding since July.  Saw her PCP, Dr Logan Bores, about 3 weeks ago, was given progesterone pills, which she took for a week.  This stopped her period, but returned after stopping the medication.  She reports having heavy bleeding, going through 9-10 pads a day.  She woke up this morning with blood in her bed.  Regular periods, 28 days.  Menses normally 7 days.   Had trichamonas before.  No other STDs.  Denies vaginal or cervical procedures.    Past Medical History  Diagnosis Date  . No pertinent past medical history     Past Surgical History  Procedure Date  . Tubal ligation     No family history on file.  History  Substance Use Topics  . Smoking status: Never Smoker   . Smokeless tobacco: Not on file  . Alcohol Use: No    Allergies: No Known Allergies  Prescriptions prior to admission  Medication Sig Dispense Refill  . ibuprofen (ADVIL,MOTRIN) 200 MG tablet Take 400 mg by mouth every 6 (six) hours as needed. Patient took medication for pain.       . megestrol (MEGACE) 40 MG tablet Take 40 mg by mouth daily.          Review of Systems  Constitutional: Positive for malaise/fatigue. Negative for fever, chills and diaphoresis.  Respiratory: Negative for cough.   Genitourinary: Negative for dysuria, urgency and frequency.  Neurological: Negative for tingling and weakness.  Endo/Heme/Allergies: Does not bruise/bleed easily.   Physical Exam   Blood pressure 111/78, pulse 98, temperature 98.9 F (37.2 C), temperature source Oral, resp. rate 18, height 5\' 6"  (1.676 m), weight 62.143 kg (137 lb), last menstrual period 01/24/2011.  Physical Exam  Constitutional: She appears well-developed and well-nourished.  HENT:  Head: Normocephalic and atraumatic.  Eyes: Conjunctivae are normal. Pupils are equal, round, and reactive to light.  Cardiovascular: Normal rate and regular  rhythm.   Respiratory: Effort normal and breath sounds normal.  GI: Soft. Bowel sounds are normal. She exhibits no distension and no mass. There is tenderness. There is no rebound and no guarding.  Genitourinary: There is no rash, tenderness, lesion or injury on the right labia. There is no rash, tenderness, lesion or injury on the left labia. Cervix exhibits discharge. Cervix exhibits no motion tenderness and no friability. Right adnexum displays tenderness. Right adnexum displays no mass and no fullness. Left adnexum displays tenderness. Left adnexum displays no mass and no fullness. There is bleeding around the vagina. No erythema or tenderness around the vagina. No foreign body around the vagina. No signs of injury around the vagina. No vaginal discharge found.   CBC    Component Value Date/Time   WBC 6.1 02/24/2011 1542   RBC 4.04 02/24/2011 1542   HGB 11.7* 02/24/2011 1542   HCT 34.8* 02/24/2011 1542   PLT 235 02/24/2011 1542   MCV 86.1 02/24/2011 1542   MCH 29.0 02/24/2011 1542   MCHC 33.6 02/24/2011 1542   RDW 13.4 02/24/2011 1542   LYMPHSABS 0.7 08/02/2010 0800   MONOABS 0.4 08/02/2010 0800   EOSABS 0.0 08/02/2010 0800   BASOSABS 0.0 08/02/2010 0800    Urine dipstick shows negative for all components.   Wet prep negative. Gc/Chlamydia pending. TSH pending [place] - structurally normal.   MAU Course  Procedures   Assessment and Plan  1.  Dysfunctional Uterine  Bleeding We have ruled out fibroids and other structural reasons for the dysfunctional uterine bleeding, as well as r/o anemia.  Will have pt follow up with primary provider next week to follow up labs.  If pt becomes more symptomatic, then should return to MAU.  Laurie Bell JEHIEL 02/24/2011, 3:51 PM

## 2011-02-24 NOTE — Discharge Instructions (Signed)
Abnormal Uterine Bleeding Abnormal uterine bleeding can have many causes. Some cases are simply treated, while others are more serious. There are several kinds of bleeding that is considered abnormal, including:  Bleeding between periods.   Bleeding after sexual intercourse.   Spotting anytime in the menstrual cycle.   Bleeding heavier or more than normal.   Bleeding after menopause.  CAUSES There are many causes of abnormal uterine bleeding. It can be present in teenagers, pregnant women, women during their reproductive years, and women who have reached menopause. Your caregiver will look for the more common causes depending on your age, signs, symptoms and your particular circumstance. Most cases are not serious and can be treated. Even the more serious causes, like cancer of the female organs, can be treated adequately if found in the early stages. That is why all types of bleeding should be evaluated and treated as soon as possible. DIAGNOSIS Diagnosing the cause may take several kinds of tests. Your caregiver may:  Take a complete history of the type of bleeding.   Perform a complete physical exam and Pap smear.   Take an ultrasound on the abdomen showing a picture of the female organs and the pelvis.   Inject dye into the uterus and Fallopian tubes and X-ray them (hysterosalpingogram).   Place fluid in the uterus and do an ultrasound (sonohysterogrqphy).   Take a CT scan to examine the female organs and pelvis.   Take an MRI to examine the female organs and pelvis. There is no X-ray involved with this procedure.   Look inside the uterus with a telescope that has a light at the end (hysteroscopy).   Scrap the inside of the uterus to get tissue to examine (Dilatation and Curettage, D&C).   Look into the pelvis with a telescope that has a light at the end (laparoscopy). This is done through a very small cut (incision) in the abdomen.  TREATMENT Treatment will depend on the  cause of the abnormal bleeding. It can include:  Doing nothing to allow the problem to take care of itself over time.   Hormone treatment.   Birth control pills.   Treating the medical condition causing the problem.   Laparoscopy.   Major or minor surgery   Destroying the lining of the uterus with electrical currant, laser, freezing or heat (uterine ablation).  HOME CARE INSTRUCTIONS  Follow your caregiver's recommendation on how to treat your problem.   See your caregiver if you missed a menstrual period and think you may be pregnant.   If you are bleeding heavily, count the number of pads/tampons you use and how often you have to change them. Tell this to your caregiver.   Avoid sexual intercourse until the problem is controlled.  SEEK MEDICAL CARE IF:  You have any kind of abnormal bleeding mentioned above.   You feel dizzy at times.   You are 30 years old and have not had a menstrual period yet.  SEEK IMMEDIATE MEDICAL CARE IF:  You pass out.   You are changing pads/tampons every 15 to 30 minutes.   You have belly (abdominal) pain.   You have a temperature of 100 F (37.8 C) or higher.   You become sweaty or weak.   You are passing large blood clots from the vagina.   You start to feel sick to your stomach (nauseous) and throw up (vomit).  Document Released: 05/29/2005 Document Re-Released: 03/26/2009 [employer] Patient Information 2011 [employer]. 

## 2011-02-25 LAB — GC/CHLAMYDIA PROBE AMP, GENITAL
Chlamydia, DNA Probe: NEGATIVE
GC Probe Amp, Genital: NEGATIVE

## 2011-03-07 LAB — URINE MICROSCOPIC-ADD ON

## 2011-03-07 LAB — COMPREHENSIVE METABOLIC PANEL
ALT: 14
AST: 28
Albumin: 3.9
Alkaline Phosphatase: 62
BUN: 15
CO2: 26
Calcium: 9
Chloride: 106
Creatinine, Ser: 0.72
GFR calc Af Amer: >60
GFR calc non Af Amer: >60
Glucose, Bld: 85
Potassium: 3.7
Sodium: 138
Total Bilirubin: 1.3 — ABNORMAL HIGH
Total Protein: 7.2

## 2011-03-07 LAB — DIFFERENTIAL
Basophils Absolute: 0
Basophils Relative: 0
Eosinophils Absolute: 0.2
Eosinophils Relative: 4
Lymphocytes Relative: 16
Lymphs Abs: 0.7
Monocytes Absolute: 0.5
Monocytes Relative: 11
Neutro Abs: 3.1
Neutrophils Relative %: 69

## 2011-03-07 LAB — CBC
HCT: 41
Hemoglobin: 13.8
MCHC: 33.6
MCV: 87.7
Platelets: 160
RBC: 4.68
RDW: 12.9
WBC: 4.5

## 2011-03-07 LAB — URINALYSIS, ROUTINE W REFLEX MICROSCOPIC
Bilirubin Urine: NEGATIVE
Glucose, UA: NEGATIVE
Ketones, ur: NEGATIVE
Leukocytes, UA: NEGATIVE
Nitrite: NEGATIVE
Protein, ur: NEGATIVE
Specific Gravity, Urine: 1.023
Urobilinogen, UA: 1
pH: 6.5

## 2011-03-07 LAB — PREGNANCY, URINE: Preg Test, Ur: NEGATIVE

## 2011-03-07 LAB — LIPASE, BLOOD: Lipase: 22

## 2011-03-09 LAB — PROTIME-INR
INR: 1.2
Prothrombin Time: 16 — ABNORMAL HIGH

## 2011-03-09 LAB — DIFFERENTIAL
Basophils Absolute: 0
Basophils Relative: 0
Eosinophils Absolute: 0
Eosinophils Relative: 0
Lymphocytes Relative: 6 — ABNORMAL LOW
Lymphs Abs: 0.8
Monocytes Absolute: 1.1 — ABNORMAL HIGH
Monocytes Relative: 8
Neutro Abs: 11.9 — ABNORMAL HIGH
Neutrophils Relative %: 86 — ABNORMAL HIGH

## 2011-03-09 LAB — CBC
HCT: 41.3
HCT: 45
Hemoglobin: 13.6
Hemoglobin: 15.1 — ABNORMAL HIGH
MCHC: 33
MCHC: 33.5
MCV: 90.6
MCV: 90.9
Platelets: 162
Platelets: 99 — ABNORMAL LOW
RBC: 4.54
RBC: 4.96
RDW: 12.4
RDW: 12.4
WBC: 12.2 — ABNORMAL HIGH
WBC: 13.8 — ABNORMAL HIGH

## 2011-03-09 LAB — POCT I-STAT, CHEM 8
BUN: 10
Calcium, Ion: 1.18
Chloride: 103
Creatinine, Ser: 0.9
Glucose, Bld: 105 — ABNORMAL HIGH
HCT: 46
Hemoglobin: 15.6 — ABNORMAL HIGH
Potassium: 3.3 — ABNORMAL LOW
Sodium: 139
TCO2: 24

## 2011-03-09 LAB — URINALYSIS, ROUTINE W REFLEX MICROSCOPIC
Bilirubin Urine: NEGATIVE
Glucose, UA: NEGATIVE
Ketones, ur: NEGATIVE
Nitrite: NEGATIVE
Protein, ur: 30 — AB
Specific Gravity, Urine: 1.013
Urobilinogen, UA: 0.2
pH: 6.5

## 2011-03-09 LAB — CULTURE, BLOOD (ROUTINE X 2)
Culture: NO GROWTH
Culture: NO GROWTH

## 2011-03-09 LAB — COMPREHENSIVE METABOLIC PANEL
ALT: 13
AST: 16
Albumin: 3.6
Alkaline Phosphatase: 73
BUN: 5 — ABNORMAL LOW
CO2: 26
Calcium: 8.7
Chloride: 104
Creatinine, Ser: 0.73
GFR calc Af Amer: >60
GFR calc non Af Amer: >60
Glucose, Bld: 120 — ABNORMAL HIGH
Potassium: 3 — ABNORMAL LOW
Sodium: 136
Total Bilirubin: 1.4 — ABNORMAL HIGH
Total Protein: 7.4

## 2011-03-09 LAB — H1N1 SCREEN (PCR): H1N1 Virus Scrn: NOT DETECTED

## 2011-03-09 LAB — PHOSPHORUS: Phosphorus: 2.8

## 2011-03-09 LAB — HEMOGLOBIN A1C
Hgb A1c MFr Bld: 5.2
Mean Plasma Glucose: 108

## 2011-03-09 LAB — AMYLASE: Amylase: 64

## 2011-03-09 LAB — URINE MICROSCOPIC-ADD ON

## 2011-03-09 LAB — MAGNESIUM: Magnesium: 1.8

## 2011-03-09 LAB — APTT: aPTT: 39 — ABNORMAL HIGH

## 2011-03-09 LAB — LIPASE, BLOOD: Lipase: 15

## 2011-03-09 LAB — PREGNANCY, URINE: Preg Test, Ur: NEGATIVE

## 2011-03-10 LAB — BASIC METABOLIC PANEL WITH GFR
BUN: 2 — ABNORMAL LOW
BUN: 3 — ABNORMAL LOW
CO2: 26
CO2: 26
Calcium: 8.7
Calcium: 9
Chloride: 107
Chloride: 108
Creatinine, Ser: 0.7
Creatinine, Ser: 0.74
GFR calc non Af Amer: >60
GFR calc non Af Amer: >60
Glucose, Bld: 123 — ABNORMAL HIGH
Glucose, Bld: 96
Potassium: 3.2 — ABNORMAL LOW
Potassium: 3.8
Sodium: 139
Sodium: 140

## 2011-03-10 LAB — HEMOGLOBIN AND HEMATOCRIT, BLOOD
HCT: 36.8
Hemoglobin: 12.3

## 2011-03-10 LAB — CBC
HCT: 36.2
Hemoglobin: 12.3
MCHC: 34.1
MCV: 89
Platelets: 139 — ABNORMAL LOW
RBC: 4.07
RDW: 12.2
WBC: 8.9

## 2011-05-17 ENCOUNTER — Encounter: Payer: Medicaid Other | Admitting: Obstetrics and Gynecology

## 2011-07-10 ENCOUNTER — Encounter: Payer: Medicaid Other | Admitting: Family

## 2011-07-27 ENCOUNTER — Encounter: Payer: Medicaid Other | Admitting: Physician Assistant

## 2011-08-01 ENCOUNTER — Emergency Department (HOSPITAL_COMMUNITY): Payer: Self-pay

## 2011-08-01 ENCOUNTER — Emergency Department (HOSPITAL_COMMUNITY)
Admission: EM | Admit: 2011-08-01 | Discharge: 2011-08-01 | Disposition: A | Discharge status: Home Health | Payer: Self-pay | Attending: Emergency Medicine | Admitting: Emergency Medicine

## 2011-08-01 ENCOUNTER — Other Ambulatory Visit: Payer: Self-pay

## 2011-08-01 ENCOUNTER — Encounter (HOSPITAL_COMMUNITY): Payer: Self-pay | Admitting: *Deleted

## 2011-08-01 DIAGNOSIS — R52 Pain, unspecified: Secondary | ICD-10-CM | POA: Insufficient documentation

## 2011-08-01 DIAGNOSIS — M549 Dorsalgia, unspecified: Secondary | ICD-10-CM | POA: Insufficient documentation

## 2011-08-01 DIAGNOSIS — R112 Nausea with vomiting, unspecified: Secondary | ICD-10-CM | POA: Insufficient documentation

## 2011-08-01 DIAGNOSIS — R0602 Shortness of breath: Secondary | ICD-10-CM | POA: Insufficient documentation

## 2011-08-01 DIAGNOSIS — R110 Nausea: Secondary | ICD-10-CM | POA: Insufficient documentation

## 2011-08-01 DIAGNOSIS — R197 Diarrhea, unspecified: Secondary | ICD-10-CM | POA: Insufficient documentation

## 2011-08-01 DIAGNOSIS — IMO0001 Reserved for inherently not codable concepts without codable children: Secondary | ICD-10-CM | POA: Insufficient documentation

## 2011-08-01 DIAGNOSIS — M542 Cervicalgia: Secondary | ICD-10-CM | POA: Insufficient documentation

## 2011-08-01 DIAGNOSIS — N390 Urinary tract infection, site not specified: Secondary | ICD-10-CM | POA: Insufficient documentation

## 2011-08-01 DIAGNOSIS — R10816 Epigastric abdominal tenderness: Secondary | ICD-10-CM | POA: Insufficient documentation

## 2011-08-01 DIAGNOSIS — R109 Unspecified abdominal pain: Secondary | ICD-10-CM | POA: Insufficient documentation

## 2011-08-01 DIAGNOSIS — R079 Chest pain, unspecified: Secondary | ICD-10-CM | POA: Insufficient documentation

## 2011-08-01 DIAGNOSIS — R6883 Chills (without fever): Secondary | ICD-10-CM | POA: Insufficient documentation

## 2011-08-01 DIAGNOSIS — R51 Headache: Secondary | ICD-10-CM | POA: Insufficient documentation

## 2011-08-01 LAB — DIFFERENTIAL
Basophils Absolute: 0 10*3/uL (ref 0.0–0.1)
Basophils Relative: 1 % (ref 0–1)
Eosinophils Absolute: 0.1 10*3/uL (ref 0.0–0.7)
Eosinophils Relative: 2 % (ref 0–5)
Lymphocytes Relative: 39 % (ref 12–46)
Lymphs Abs: 1.5 10*3/uL (ref 0.7–4.0)
Monocytes Absolute: 0.5 10*3/uL (ref 0.1–1.0)
Monocytes Relative: 14 % — ABNORMAL HIGH (ref 3–12)
Neutro Abs: 1.7 10*3/uL (ref 1.7–7.7)
Neutrophils Relative %: 44 % (ref 43–77)

## 2011-08-01 LAB — COMPREHENSIVE METABOLIC PANEL
ALT: 8 U/L (ref 0–35)
AST: 16 U/L (ref 0–37)
Albumin: 4.2 g/dL (ref 3.5–5.2)
Alkaline Phosphatase: 89 U/L (ref 39–117)
BUN: 8 mg/dL (ref 6–23)
CO2: 27 mEq/L (ref 19–32)
Calcium: 9.4 mg/dL (ref 8.4–10.5)
Chloride: 103 mEq/L (ref 96–112)
Creatinine, Ser: 0.69 mg/dL (ref 0.50–1.10)
GFR calc Af Amer: >90 mL/min (ref >90)
GFR calc non Af Amer: >90 mL/min (ref >90)
Glucose, Bld: 89 mg/dL (ref 70–99)
Potassium: 3.3 mEq/L — ABNORMAL LOW (ref 3.5–5.1)
Sodium: 139 mEq/L (ref 135–145)
Total Bilirubin: 0.3 mg/dL (ref 0.3–1.2)
Total Protein: 8 g/dL (ref 6.0–8.3)

## 2011-08-01 LAB — CBC
HCT: 39.2 % (ref 36.0–46.0)
Hemoglobin: 13.4 g/dL (ref 12.0–15.0)
MCH: 29.5 pg (ref 26.0–34.0)
MCHC: 34.2 g/dL (ref 30.0–36.0)
MCV: 86.2 fL (ref 78.0–100.0)
Platelets: 235 10*3/uL (ref 150–400)
RBC: 4.55 MIL/uL (ref 3.87–5.11)
RDW: 13 % (ref 11.5–15.5)
WBC: 3.8 10*3/uL — ABNORMAL LOW (ref 4.0–10.5)

## 2011-08-01 LAB — URINE MICROSCOPIC-ADD ON

## 2011-08-01 LAB — URINALYSIS, ROUTINE W REFLEX MICROSCOPIC
Bilirubin Urine: NEGATIVE
Glucose, UA: NEGATIVE mg/dL
Ketones, ur: NEGATIVE mg/dL
Leukocytes, UA: NEGATIVE
Nitrite: POSITIVE — AB
Protein, ur: NEGATIVE mg/dL
Specific Gravity, Urine: 1.024 (ref 1.005–1.030)
Urobilinogen, UA: 1 mg/dL (ref 0.0–1.0)
pH: 6 (ref 5.0–8.0)

## 2011-08-01 LAB — LIPASE, BLOOD: Lipase: 31 U/L (ref 11–59)

## 2011-08-01 LAB — D-DIMER, QUANTITATIVE: D-Dimer, Quant: 0.42 ug/mL-FEU (ref 0.00–0.48)

## 2011-08-01 LAB — POCT PREGNANCY, URINE: Preg Test, Ur: NEGATIVE

## 2011-08-01 MED ORDER — HYDROCODONE-ACETAMINOPHEN 5-325 MG PO TABS: 1.0000 | ORAL_TABLET | Freq: Four times a day (QID) | ORAL | Status: AC | PRN

## 2011-08-01 MED ORDER — NITROFURANTOIN MONOHYD MACRO 100 MG PO CAPS: 100.0000 mg | ORAL_CAPSULE | Freq: Two times a day (BID) | ORAL | Status: AC

## 2011-08-01 MED ORDER — KETOROLAC TROMETHAMINE 60 MG/2ML IM SOLN
60.0000 mg | Freq: Once | INTRAMUSCULAR | Status: AC
  Administered 2011-08-01: 60 mg via INTRAMUSCULAR
  Filled 2011-08-01: qty 2

## 2011-08-01 NOTE — Discharge Instructions (Signed)

## 2011-08-01 NOTE — ED Provider Notes (Signed)
1:58 PM  HPI Patient reports multiple symptoms for 2 weeks. Symptoms include a posterior headache, neck pain, chills, myalgias, SOB, N/V/D and abdominal pain. Reports symptoms worsened in the last 3 days. Reports headache is a squeezing pain. Also reports pain in her frontal sinuses which feels a pressure. Pain is bilateral muscular tenderness. Associated with bilateral muscular back pain. Reports shortness of breath is worse with exertion. Reports abdominal pain is epigastric. States pain feels like a burning pain. Denies history heavy alcohol abuse or drug abuse. Denies history of abdominal surgeries. Denies fever, chest pain, cough, congestion, urinary symptoms or vaginal symptoms.     Past Medical History  Diagnosis Date  . No pertinent past medical history    History   Social History  . Marital Status: Single    Spouse Name: N/A    Number of Children: N/A  . Years of Education: N/A   Occupational History  . Not on file.   Social History Main Topics  . Smoking status: Never Smoker   . Smokeless tobacco: Not on file  . Alcohol Use: No  . Drug Use: No  . Sexually Active: Yes    Birth Control/ Protection: Surgical   Other Topics Concern  . Not on file   Social History Narrative  . No narrative on file   No current facility-administered medications on file prior to encounter.   Current Outpatient Prescriptions on File Prior to Encounter  Medication Sig Dispense Refill  . ibuprofen (ADVIL,MOTRIN) 200 MG tablet Take 400 mg by mouth every 6 (six) hours as needed. Pain.       No Known Allergies  Review of Systems  Constitutional: Negative for fever and chills.  HENT: Positive for ear pain. Negative for congestion, sore throat and neck pain.   Eyes: Negative for blurred vision and photophobia.  Respiratory: Positive for shortness of breath. Negative for cough and wheezing.   Cardiovascular: Negative for chest pain and palpitations.  Gastrointestinal: Positive for nausea,  vomiting, abdominal pain and diarrhea. Negative for heartburn.  Genitourinary: Negative for dysuria, urgency, frequency, hematuria and flank pain.  Musculoskeletal: Positive for myalgias. Negative for back pain.  Skin: Negative for rash.  Neurological: Positive for headaches. Negative for dizziness, tingling and weakness.  All other systems reviewed and are negative.   Physical Exam  Vitals reviewed. Constitutional: She is oriented to person, place, and time. She appears well-developed and well-nourished. No distress.  HENT:  Head: Normocephalic and atraumatic.  Right Ear: External ear normal.  Left Ear: External ear normal.  Nose: Nose normal.  Mouth/Throat: Oropharynx is clear and moist. No oropharyngeal exudate.       Normal TMs bilateral. Canals normal  Eyes: Conjunctivae and EOM are normal. Pupils are equal, round, and reactive to light.  Neck: Normal range of motion. Neck supple.  Cardiovascular: Normal rate, regular rhythm and normal heart sounds.  Exam reveals no gallop and no friction rub.   No murmur heard. Pulmonary/Chest: She has no wheezes. She has no rales. She exhibits no tenderness.  Abdominal: Soft. Bowel sounds are normal. She exhibits no distension and no mass. There is tenderness (epigastric tenderness). There is no rebound and no guarding.       Upper abdomen tender to palpation. Normal bowel sounds, no rigidity, no guarding, negative Murphy's and McBurney's  Neurological: She is alert and oriented to person, place, and time. No cranial nerve deficit. She exhibits normal muscle tone. Coordination normal.  Skin: Skin is warm and dry. She is  not diaphoretic.  Psychiatric: She has a normal mood and affect.    MDM  Results for orders placed during the hospital encounter of 08/01/11  CBC      Component Value Range   WBC 3.8 (*) 4.0 - 10.5 (K/uL)   RBC 4.55  3.87 - 5.11 (MIL/uL)   Hemoglobin 13.4  12.0 - 15.0 (g/dL)   HCT 21.3  08.6 - 57.8 (%)   MCV 86.2  78.0 -  100.0 (fL)   MCH 29.5  26.0 - 34.0 (pg)   MCHC 34.2  30.0 - 36.0 (g/dL)   RDW 46.9  62.9 - 52.8 (%)   Platelets 235  150 - 400 (K/uL)  DIFFERENTIAL      Component Value Range   Neutrophils Relative 44  43 - 77 (%)   Neutro Abs 1.7  1.7 - 7.7 (K/uL)   Lymphocytes Relative 39  12 - 46 (%)   Lymphs Abs 1.5  0.7 - 4.0 (K/uL)   Monocytes Relative 14 (*) 3 - 12 (%)   Monocytes Absolute 0.5  0.1 - 1.0 (K/uL)   Eosinophils Relative 2  0 - 5 (%)   Eosinophils Absolute 0.1  0.0 - 0.7 (K/uL)   Basophils Relative 1  0 - 1 (%)   Basophils Absolute 0.0  0.0 - 0.1 (K/uL)  URINALYSIS, ROUTINE W REFLEX MICROSCOPIC      Component Value Range   Color, Urine YELLOW  YELLOW    APPearance CLOUDY (*) CLEAR    Specific Gravity, Urine 1.024  1.005 - 1.030    pH 6.0  5.0 - 8.0    Glucose, UA NEGATIVE  NEGATIVE (mg/dL)   Hgb urine dipstick MODERATE (*) NEGATIVE    Bilirubin Urine NEGATIVE  NEGATIVE    Ketones, ur NEGATIVE  NEGATIVE (mg/dL)   Protein, ur NEGATIVE  NEGATIVE (mg/dL)   Urobilinogen, UA 1.0  0.0 - 1.0 (mg/dL)   Nitrite POSITIVE (*) NEGATIVE    Leukocytes, UA NEGATIVE  NEGATIVE   D-DIMER, QUANTITATIVE      Component Value Range   D-Dimer, Quant 0.42  0.00 - 0.48 (ug/mL-FEU)  LIPASE, BLOOD      Component Value Range   Lipase 31  11 - 59 (U/L)  COMPREHENSIVE METABOLIC PANEL      Component Value Range   Sodium 139  135 - 145 (mEq/L)   Potassium 3.3 (*) 3.5 - 5.1 (mEq/L)   Chloride 103  96 - 112 (mEq/L)   CO2 27  19 - 32 (mEq/L)   Glucose, Bld 89  70 - 99 (mg/dL)   BUN 8  6 - 23 (mg/dL)   Creatinine, Ser 4.13  0.50 - 1.10 (mg/dL)   Calcium 9.4  8.4 - 24.4 (mg/dL)   Total Protein 8.0  6.0 - 8.3 (g/dL)   Albumin 4.2  3.5 - 5.2 (g/dL)   AST 16  0 - 37 (U/L)   ALT 8  0 - 35 (U/L)   Alkaline Phosphatase 89  39 - 117 (U/L)   Total Bilirubin 0.3  0.3 - 1.2 (mg/dL)   GFR calc non Af Amer >90  >90 (mL/min)   GFR calc Af Amer >90  >90 (mL/min)  POCT PREGNANCY, URINE      Component Value  Range   Preg Test, Ur NEGATIVE  NEGATIVE   URINE MICROSCOPIC-ADD ON      Component Value Range   Squamous Epithelial / LPF FEW (*) RARE    WBC, UA 0-2  <3 (WBC/hpf)  RBC / HPF 3-6  <3 (RBC/hpf)   Bacteria, UA MANY (*) RARE    Urine-Other MUCOUS PRESENT     Dg Chest 2 View  08/01/2011  *RADIOLOGY REPORT*  Clinical Data: Shortness of breath.  Chest pain.  CHEST - 2 VIEW  Comparison: Two-view chest 02/27/2010.  Findings: The heart size is normal.  The lungs are clear.  The visualized soft tissues and bony thorax are unremarkable.  IMPRESSION: Negative chest.  Original Report Authenticated By: Jamesetta Orleans. MATTERN, M.D.     Plan: Send patient to [facility] for evaluation. Ordered basic labs, CXR. Added D-dimer since patient reports SOB with Exertion.   Thomasene Lot, PA-C 08/01/11 1406  3:44 PM Patient's labs have returned. Patient has a urinary tract infection. Otherwise labs are within normal limits. Patient's abdominal pain is nondescript. Initially described epigastric pain but upon recheck patient states pain moves from right to left side. Potentially this could be due to a urinary tract infection. Will treat infection and pain. Discussed this with patient increased and is ready for discharge.  Thomasene Lot, PA-C 08/01/11 1545  Thomasene Lot, PA-C 08/01/11 1546

## 2011-08-01 NOTE — ED Notes (Signed)
Pt states she started to have a headache about 3 days ago with body aches. Pt denies any blurred vision with her headaches or vomiting/diarherra. Pt state she is having body aches, decreased appetite and sob when ambulating. Pt denies any chest pain

## 2011-08-01 NOTE — ED Notes (Signed)
Patient transported to X-ray 

## 2011-08-03 NOTE — ED Provider Notes (Signed)
Medical screening examination/treatment/procedure(s) were performed by non-physician practitioner and as supervising physician I was immediately available for consultation/collaboration.  Cyndra Numbers, MD 08/03/11 1339

## 2011-08-28 ENCOUNTER — Emergency Department (HOSPITAL_COMMUNITY)
Admission: EM | Admit: 2011-08-28 | Discharge: 2011-08-28 | Disposition: A | Discharge status: Home / Self Care | Payer: Self-pay | Attending: Emergency Medicine | Admitting: Emergency Medicine

## 2011-08-28 ENCOUNTER — Encounter (HOSPITAL_COMMUNITY): Payer: Self-pay | Admitting: *Deleted

## 2011-08-28 DIAGNOSIS — R21 Rash and other nonspecific skin eruption: Secondary | ICD-10-CM

## 2011-08-28 DIAGNOSIS — L988 Other specified disorders of the skin and subcutaneous tissue: Secondary | ICD-10-CM | POA: Insufficient documentation

## 2011-08-28 MED ORDER — FAMOTIDINE 40 MG PO TABS: 40.0000 mg | ORAL_TABLET | Freq: Every day | ORAL | Status: DC

## 2011-08-28 MED ORDER — CEPHALEXIN 500 MG PO CAPS: 500.0000 mg | ORAL_CAPSULE | Freq: Four times a day (QID) | ORAL | Status: AC

## 2011-08-28 MED ORDER — DIPHENHYDRAMINE HCL 25 MG PO TABS: 25.0000 mg | ORAL_TABLET | Freq: Four times a day (QID) | ORAL | Status: DC

## 2011-08-28 NOTE — ED Provider Notes (Signed)
History     CSN: [id number]  Arrival date & time 08/28/11  1247   First MD Initiated Contact with Patient 08/28/11 1412      Chief Complaint  Patient presents with  . Allergic Reaction    tattoo    (Consider location/radiation/quality/duration/timing/severity/associated sxs/prior treatment) HPI  31 year old female presents ED with a chief complaints of rash. Patient states she had a tattoo done 9 days ago on her right forearm. Since the tattoo she has noticed rash to the tattoo, more specifically along the orange ink on her tatoo throughout.  Area is itching and burning. She has been using her A&D ointment and antibiotic ointment without relief. She denies fever, throat swelling, nausea, vomiting, diarrhea. She denies similar reaction in the past. She denies any recent medication change, or other environmental exposure.    Past Medical History  Diagnosis Date  . No pertinent past medical history     Past Surgical History  Procedure Date  . Tubal ligation     No family history on file.  History  Substance Use Topics  . Smoking status: Never Smoker   . Smokeless tobacco: Not on file  . Alcohol Use: No    OB History    Grav Para Term Preterm Abortions TAB SAB Ect Mult Living   2 2 2  0 0 0 0 0 0 2      Review of Systems  All other systems reviewed and are negative.    Allergies  Review of patient's allergies indicates no known allergies.  Home Medications   Current Outpatient Rx  Name Route Sig Dispense Refill  . IBUPROFEN 200 MG PO TABS Oral Take 400 mg by mouth every 6 (six) hours as needed. Pain.    Marland Kitchen OVER THE COUNTER MEDICATION  See admin instructions. CB-1 Weight Gainer. Take 2 capsules 3 times daily.    Marland Kitchen ZOLPIDEM TARTRATE 10 MG PO TABS Oral Take 10 mg by mouth at bedtime.      BP 106/67  Pulse 81  Temp(Src) 97.5 F (36.4 C) (Oral)  Resp 20  SpO2 99%  LMP 08/16/2011  Physical Exam  Nursing note and vitals reviewed. Constitutional: She appears  well-developed and well-nourished. No distress.  HENT:  Head: Atraumatic.  Mouth/Throat: Oropharynx is clear and moist. No oropharyngeal exudate.  Eyes: Conjunctivae are normal.  Neck: Neck supple.  Cardiovascular: Normal rate.   Pulmonary/Chest: Effort normal and breath sounds normal. No respiratory distress. She has no wheezes. She has no rales. She exhibits no tenderness.  Musculoskeletal:       Right forearm: The patient has tattoos of roses and vines to her dorsum of the forearm.  Localized maculopapular rash only presents on the orange ink.  Non pustular, non petechiae.    Neurological: She is alert.  Skin: Skin is warm.    ED Course  Procedures (including critical care time)  Labs Reviewed - No data to display No results found.   No diagnosis found.    MDM  Localized skin reaction to orange dye in her tattoo.  No signs of systemic rxn.  Will prescribe keflex, pepcid, and benadryl with f/u instruction.  Instruct pt to avoid future tattoo due to risk of rxn.          Fayrene Helper, PA-C 08/28/11 1423

## 2011-08-28 NOTE — ED Notes (Signed)
Pt reports having a tattoo done x 9 days ago, reports red raised rash on her R arm with the tattoo.  Pt reports applying A&D ointment and abx ointment on it without relief.

## 2011-08-28 NOTE — Discharge Instructions (Signed)

## 2011-08-30 NOTE — ED Provider Notes (Signed)
Medical screening examination/treatment/procedure(s) were performed by non-physician practitioner and as supervising physician I was immediately available for consultation/collaboration.  Amayah Staheli T Ylianna Almanzar, MD 08/30/11 0930 

## 2012-06-12 ENCOUNTER — Encounter (HOSPITAL_COMMUNITY): Payer: Self-pay | Admitting: *Deleted

## 2012-06-12 ENCOUNTER — Emergency Department (INDEPENDENT_AMBULATORY_CARE_PROVIDER_SITE_OTHER): Payer: Self-pay

## 2012-06-12 ENCOUNTER — Emergency Department (INDEPENDENT_AMBULATORY_CARE_PROVIDER_SITE_OTHER)
Admission: EM | Admit: 2012-06-12 | Discharge: 2012-06-12 | Disposition: A | Discharge status: Home / Self Care | Payer: Self-pay | Source: Home / Self Care | Attending: Family Medicine | Admitting: Family Medicine

## 2012-06-12 DIAGNOSIS — J0100 Acute maxillary sinusitis, unspecified: Secondary | ICD-10-CM

## 2012-06-12 MED ORDER — FLUTICASONE PROPIONATE 50 MCG/ACT NA SUSP: 1.0000 | Freq: Two times a day (BID) | NASAL | Status: DC

## 2012-06-12 MED ORDER — AMOXICILLIN-POT CLAVULANATE 875-125 MG PO TABS: 1.0000 | ORAL_TABLET | Freq: Two times a day (BID) | ORAL | Status: DC

## 2012-06-12 NOTE — ED Provider Notes (Signed)
History     CSN: [id number]  Arrival date & time 06/12/12  1312   First MD Initiated Contact with Patient 06/12/12 1440      Chief Complaint  Patient presents with  . URI    (Consider location/radiation/quality/duration/timing/severity/associated sxs/prior treatment) Patient is a 32 y.o. female presenting with URI. The history is provided by the patient.  URI The primary symptoms include headaches and cough. Primary symptoms do not include fever, ear pain, sore throat, swollen glands, nausea or vomiting. The current episode started more than 1 week ago (2 weeks of progressive sx.). This is a new problem. The problem has been gradually worsening.  The onset of the illness is associated with exposure to sick contacts. Symptoms associated with the illness include facial pain, sinus pressure, congestion and rhinorrhea. The illness is not associated with chills.    Past Medical History  Diagnosis Date  . No pertinent past medical history     Past Surgical History  Procedure Date  . Tubal ligation     No family history on file.  History  Substance Use Topics  . Smoking status: Never Smoker   . Smokeless tobacco: Not on file  . Alcohol Use: No    OB History    Grav Para Term Preterm Abortions TAB SAB Ect Mult Living   2 2 2  0 0 0 0 0 0 2      Review of Systems  Constitutional: Negative for fever and chills.  HENT: Positive for congestion, rhinorrhea and sinus pressure. Negative for ear pain and sore throat.   Respiratory: Positive for cough.   Gastrointestinal: Negative for nausea and vomiting.  Neurological: Positive for headaches.    Allergies  Review of patient's allergies indicates no known allergies.  Home Medications   Current Outpatient Rx  Name  Route  Sig  Dispense  Refill  . AMOXICILLIN-POT CLAVULANATE 875-125 MG PO TABS   Oral   Take 1 tablet by mouth 2 (two) times daily.   30 tablet   0   . FAMOTIDINE 40 MG PO TABS   Oral   Take 1 tablet (40 mg  total) by mouth daily.   20 tablet   0   . FLUTICASONE PROPIONATE 50 MCG/ACT NA SUSP   Nasal   Place 1 spray into the nose 2 (two) times daily.   1 g   2   . ZOLPIDEM TARTRATE 10 MG PO TABS   Oral   Take 10 mg by mouth at bedtime.           BP 101/65  Pulse 84  Temp 98 F (36.7 C) (Oral)  Resp 18  SpO2 100%  LMP 06/04/2012  Physical Exam  Nursing note and vitals reviewed. Constitutional: She is oriented to person, place, and time. She appears well-developed and well-nourished. No distress.  HENT:  Head: Normocephalic.  Right Ear: External ear normal.  Left Ear: External ear normal.  Nose: Nose normal.  Mouth/Throat: Oropharynx is clear and moist.  Eyes: Conjunctivae normal are normal. Pupils are equal, round, and reactive to light.  Neck: Normal range of motion. Neck supple.  Cardiovascular: Normal rate, regular rhythm, normal heart sounds and intact distal pulses.   Pulmonary/Chest: Effort normal and breath sounds normal.  Lymphadenopathy:    She has no cervical adenopathy.  Neurological: She is alert and oriented to person, place, and time.  Skin: Skin is warm and dry.    ED Course  Procedures (including critical care time)  Labs  Reviewed - No data to display Dg Sinuses Complete  06/12/2012  *RADIOLOGY REPORT*  Clinical Data:  Facial pain, congestion  PARANASAL SINUSES - COMPLETE 3 + VIEW  Comparison: None.  Findings: The frontal and ethmoid sinuses are well-aerated. Incomplete aeration of the right greater than left maxillary sinuses suggests underlying paranasal sinus disease.  Mastoid air cells are aerated.  No focal calvarial abnormality.  Poor dentition with fractured right mandibular molar and multiple missing teeth.  IMPRESSION:  Opacification of the right greater than left maxillary sinuses consistent with underlying paranasal sinus disease.   Original Report Authenticated By: Malachy Moan, M.D.      1. Sinusitis, acute, maxillary       MDM    X-rays reviewed and report per radiologist.        Linna Hoff, MD 06/12/12 [phone]

## 2012-06-12 NOTE — ED Notes (Signed)
Pt  Reports  Symptoms  Of  Sinus  Drainage   With  Stuffy  /  Congested         X  2  Weeks  The  Symptoms  Have  Been  persistant  And        The   Pt       Reports  Sinus  Drainage  Is  Setting in her  Chest    She is  Masked  And  In a  Private  Room

## 2012-06-12 NOTE — Discharge Instructions (Signed)
Drink plenty of fluids as discussed, use medicine as prescribed, and mucinex for congestion. Return or see your doctor if further problems

## 2013-01-08 DIAGNOSIS — J3489 Other specified disorders of nose and nasal sinuses: Secondary | ICD-10-CM | POA: Insufficient documentation

## 2013-01-08 DIAGNOSIS — R51 Headache: Secondary | ICD-10-CM | POA: Insufficient documentation

## 2013-01-09 ENCOUNTER — Encounter (HOSPITAL_COMMUNITY): Payer: Self-pay | Admitting: *Deleted

## 2013-01-09 ENCOUNTER — Emergency Department (HOSPITAL_COMMUNITY)
Admission: EM | Admit: 2013-01-09 | Discharge: 2013-01-09 | Disposition: A | Discharge status: Home / Self Care | Payer: Self-pay | Attending: Emergency Medicine | Admitting: Emergency Medicine

## 2013-01-09 DIAGNOSIS — R519 Headache, unspecified: Secondary | ICD-10-CM

## 2013-01-09 MED ORDER — KETOROLAC TROMETHAMINE 30 MG/ML IJ SOLN
30.0000 mg | Freq: Once | INTRAMUSCULAR | Status: AC
  Administered 2013-01-09: 30 mg via INTRAMUSCULAR
  Filled 2013-01-09: qty 1

## 2013-01-09 MED ORDER — METOCLOPRAMIDE HCL 5 MG/ML IJ SOLN
10.0000 mg | Freq: Once | INTRAMUSCULAR | Status: AC
  Administered 2013-01-09: 10 mg via INTRAMUSCULAR
  Filled 2013-01-09: qty 2

## 2013-01-09 MED ORDER — DEXAMETHASONE SODIUM PHOSPHATE 10 MG/ML IJ SOLN
10.0000 mg | Freq: Once | INTRAMUSCULAR | Status: AC
  Administered 2013-01-09: 10 mg via INTRAMUSCULAR
  Filled 2013-01-09: qty 1

## 2013-01-09 MED ORDER — FLUTICASONE PROPIONATE 50 MCG/ACT NA SUSP: 2.0000 | Freq: Every day | NASAL | Status: DC

## 2013-01-09 NOTE — ED Provider Notes (Signed)
CSN: [id number]     Arrival date & time 01/08/13  2358 History     First MD Initiated Contact with Patient 01/09/13 0140     Chief Complaint  Patient presents with  . Headache   HPI  History provided by the patient. Patient is a 32 year old female with no significant PMH who presents with complaints of headache. Patient reports having headaches off and on for the past one month or more. Headache generally affects the right side of the head. Pain is also behind the eye described as a throbbing pressure. She occasionally feels this causes watery or blurred vision the right side. Initially she had some improvement of symptoms with ibuprofen however this has not been helping recently. Headache is most common during the afternoon time and may last 2-3 hours. She denies any specific aggravating or alleviating factors. No other associated symptoms. Denies any fever, chills or sweats. No nausea vomiting. No weakness or numbness in extremities. Patient does state that she has issues with seasonal allergies and sinus pressure at times. However she does not feel her headache is constant sinus pressure at this time. She does take Tylenol allergy sinus with good improvement of her congestion.     Past Medical History  Diagnosis Date  . No pertinent past medical history    Past Surgical History  Procedure Laterality Date  . Tubal ligation     No family history on file. History  Substance Use Topics  . Smoking status: Never Smoker   . Smokeless tobacco: Not on file  . Alcohol Use: No   OB History   Grav Para Term Preterm Abortions TAB SAB Ect Mult Living   2 2 2  0 0 0 0 0 0 2     Review of Systems  Constitutional: Negative for fever, chills and diaphoresis.  HENT: Negative for congestion, rhinorrhea and sinus pressure.   Eyes: Negative for photophobia.  Neurological: Positive for headaches. Negative for weakness and numbness.  All other systems reviewed and are negative.    Allergies   Review of patient's allergies indicates no known allergies.  Home Medications   Current Outpatient Rx  Name  Route  Sig  Dispense  Refill  . Chlorphen-Pseudoephed-APAP (TYLENOL ALLERGY SINUS PO)   Oral   Take 1 tablet by mouth 2 (two) times daily.         Marland Kitchen ibuprofen (ADVIL,MOTRIN) 200 MG tablet   Oral   Take 600 mg by mouth every 6 (six) hours as needed for pain.          BP 112/77  Pulse 85  Temp(Src) 98.5 F (36.9 C) (Oral)  Resp 18  SpO2 100%  LMP 12/26/2012 Physical Exam  Nursing note and vitals reviewed. Constitutional: She is oriented to person, place, and time. She appears well-developed and well-nourished. No distress.  HENT:  Head: Normocephalic and atraumatic.  No significant pains tenderness over the maxillary or frontal sinuses.  Eyes: Conjunctivae and EOM are normal. Pupils are equal, round, and reactive to light.  Neck: Normal range of motion. Neck supple.  No meningeal signs  Cardiovascular: Normal rate and regular rhythm.   No murmur heard. Pulmonary/Chest: Effort normal and breath sounds normal. No respiratory distress. She has no wheezes. She has no rales.  Abdominal: Soft. There is no tenderness. There is no rigidity, no rebound, no guarding, no CVA tenderness and no tenderness at McBurney's point.  Musculoskeletal: Normal range of motion.  Neurological: She is alert and oriented to person, place,  and time. She has normal strength. No cranial nerve deficit or sensory deficit. Gait normal.  Skin: Skin is warm and dry. No rash noted.  Psychiatric: She has a normal mood and affect. Her behavior is normal.    ED Course   Procedures    1. Headache     MDM  Patient seen and evaluated. Patient appears well in no acute distress. She has normal nonfocal neuro exam. She has had waxing and waning headaches for one month usually lasting 2-3 hours. No other concerning or red flag symptoms.  Patient having improvement of headache after medications. She  is ready to return home.  Angus Seller, PA-C 01/09/13 [phone]

## 2013-01-09 NOTE — ED Provider Notes (Signed)
Medical screening examination/treatment/procedure(s) were performed by non-physician practitioner and as supervising physician I was immediately available for consultation/collaboration.  Belisa Eichholz, MD 01/09/13 0432 

## 2013-01-09 NOTE — ED Notes (Signed)
Headaches off and on x 1 month; states makes vision blurry at times; meds not helping

## 2013-01-09 NOTE — Discharge Instructions (Signed)
You were seen and treated for your headache symptoms. Please continue to followup with her primary care provider for continued evaluation and treatment.    General Headache Without Cause A headache is pain or discomfort felt around the head or neck area. The specific cause of a headache may not be found. There are many causes and types of headaches. A few common ones are:  Tension headaches.  Migraine headaches.  Cluster headaches.  Chronic daily headaches. HOME CARE INSTRUCTIONS   Keep all follow-up appointments with your caregiver or any specialist referral.  Only take over-the-counter or prescription medicines for pain or discomfort as directed by your caregiver.  Lie down in a dark, quiet room when you have a headache.  Keep a headache journal to find out what may trigger your migraine headaches. For example, write down:  What you eat and drink.  How much sleep you get.  Any change to your diet or medicines.  Try massage or other relaxation techniques.  Put ice packs or heat on the head and neck. Use these 3 to 4 times per day for 15 to 20 minutes each time, or as needed.  Limit stress.  Sit up straight, and do not tense your muscles.  Quit smoking if you smoke.  Limit alcohol use.  Decrease the amount of caffeine you drink, or stop drinking caffeine.  Eat and sleep on a regular schedule.  Get 7 to 9 hours of sleep, or as recommended by your caregiver.  Keep lights dim if bright lights bother you and make your headaches worse. SEEK MEDICAL CARE IF:   You have problems with the medicines you were prescribed.  Your medicines are not working.  You have a change from the usual headache.  You have nausea or vomiting. SEEK IMMEDIATE MEDICAL CARE IF:   Your headache becomes severe.  You have a fever.  You have a stiff neck.  You have loss of vision.  You have muscular weakness or loss of muscle control.  You start losing your balance or have trouble  walking.  You feel faint or pass out.  You have severe symptoms that are different from your first symptoms. MAKE SURE YOU:   Understand these instructions.  Will watch your condition.  Will get help right away if you are not doing well or get worse. Document Released: 05/29/2005 Document Revised: 08/21/2011 Document Reviewed: 06/14/2011 [facility] Patient Information 2014 [place], [place].

## 2013-08-23 ENCOUNTER — Encounter (HOSPITAL_COMMUNITY): Payer: Self-pay | Admitting: Emergency Medicine

## 2013-08-23 ENCOUNTER — Emergency Department (HOSPITAL_COMMUNITY)
Admission: EM | Admit: 2013-08-23 | Discharge: 2013-08-24 | Disposition: A | Discharge status: Home / Self Care | Payer: PRIVATE HEALTH INSURANCE | Attending: Emergency Medicine | Admitting: Emergency Medicine

## 2013-08-23 DIAGNOSIS — J3489 Other specified disorders of nose and nasal sinuses: Secondary | ICD-10-CM | POA: Insufficient documentation

## 2013-08-23 DIAGNOSIS — Z113 Encounter for screening for infections with a predominantly sexual mode of transmission: Secondary | ICD-10-CM | POA: Insufficient documentation

## 2013-08-23 DIAGNOSIS — A599 Trichomoniasis, unspecified: Secondary | ICD-10-CM

## 2013-08-23 DIAGNOSIS — R0989 Other specified symptoms and signs involving the circulatory and respiratory systems: Secondary | ICD-10-CM | POA: Insufficient documentation

## 2013-08-23 DIAGNOSIS — B9789 Other viral agents as the cause of diseases classified elsewhere: Secondary | ICD-10-CM

## 2013-08-23 DIAGNOSIS — A5900 Urogenital trichomoniasis, unspecified: Secondary | ICD-10-CM | POA: Insufficient documentation

## 2013-08-23 DIAGNOSIS — N898 Other specified noninflammatory disorders of vagina: Secondary | ICD-10-CM | POA: Insufficient documentation

## 2013-08-23 DIAGNOSIS — J069 Acute upper respiratory infection, unspecified: Secondary | ICD-10-CM | POA: Insufficient documentation

## 2013-08-23 DIAGNOSIS — R0982 Postnasal drip: Secondary | ICD-10-CM | POA: Insufficient documentation

## 2013-08-23 DIAGNOSIS — J029 Acute pharyngitis, unspecified: Secondary | ICD-10-CM | POA: Insufficient documentation

## 2013-08-23 DIAGNOSIS — R51 Headache: Secondary | ICD-10-CM | POA: Insufficient documentation

## 2013-08-23 DIAGNOSIS — IMO0001 Reserved for inherently not codable concepts without codable children: Secondary | ICD-10-CM | POA: Insufficient documentation

## 2013-08-23 DIAGNOSIS — Z3202 Encounter for pregnancy test, result negative: Secondary | ICD-10-CM | POA: Insufficient documentation

## 2013-08-23 NOTE — ED Notes (Signed)
Pt came in for flu like symptoms of cough,  Headache , and fever but she said she just got a call from her ex and he said you may want to get checked I have Trich,  She said it just so happened she noticed a discharge today

## 2013-08-24 LAB — WET PREP, GENITAL: Yeast Wet Prep HPF POC: NONE SEEN

## 2013-08-24 LAB — URINALYSIS, ROUTINE W REFLEX MICROSCOPIC
Bilirubin Urine: NEGATIVE
Glucose, UA: NEGATIVE mg/dL
Ketones, ur: NEGATIVE mg/dL
Leukocytes, UA: NEGATIVE
Nitrite: NEGATIVE
Protein, ur: NEGATIVE mg/dL
Specific Gravity, Urine: 1.027 (ref 1.005–1.030)
Urobilinogen, UA: 1 mg/dL (ref 0.0–1.0)
pH: 6 (ref 5.0–8.0)

## 2013-08-24 LAB — URINE MICROSCOPIC-ADD ON

## 2013-08-24 LAB — HIV ANTIBODY (ROUTINE TESTING W REFLEX): HIV: NONREACTIVE

## 2013-08-24 LAB — POC URINE PREG, ED: Preg Test, Ur: NEGATIVE

## 2013-08-24 LAB — RPR: RPR Ser Ql: NONREACTIVE

## 2013-08-24 MED ORDER — BENZONATATE 100 MG PO CAPS: 100.0000 mg | ORAL_CAPSULE | Freq: Three times a day (TID) | ORAL | Status: DC

## 2013-08-24 MED ORDER — METRONIDAZOLE 500 MG PO TABS
2000.0000 mg | ORAL_TABLET | Freq: Once | ORAL | Status: AC
  Administered 2013-08-24: 2000 mg via ORAL
  Filled 2013-08-24: qty 4

## 2013-08-24 NOTE — Discharge Instructions (Signed)
1. Medications: Tessalon Perles cough, Mucinex, Tylenol/ibuprofen for body aches, usual home medications 2. Treatment: rest, drink plenty of fluids,  3. Follow Up: Please followup with your primary doctor for discussion of your diagnoses and further evaluation after today's visit; if you do not have a primary care doctor use the resource guide provided to find one;     Read the instructions below on reasons to return to the emergency department and to learn more about your diagnosis.  Use over the counter medications for symptomatic relief as we discussed (musinex as a decongestant, Tylenol for fever/pain, Motrin/Ibuprofen for muscle aches). If prescribed a cough suppressant during your visit, do not operate heavy machinery with in 5 hours of taking this medication. Followup with your primary care doctor in 4 days if your symptoms persist.  Your more than welcome to return to the emergency department if symptoms worsen or become concerning.  Upper Respiratory Infection, Adult  An upper respiratory infection (URI) is also sometimes known as the common cold. Most people improve within 1 week, but symptoms can last up to 2 weeks. A residual cough may last even longer.   URI is most commonly caused by a virus. Viruses are NOT treated with antibiotics. You can easily spread the virus to others by oral contact. This includes kissing, sharing a glass, coughing, or sneezing. Touching your mouth or nose and then touching a surface, which is then touched by another person, can also spread the virus.   TREATMENT  Treatment is directed at relieving symptoms. There is no cure. Antibiotics are not effective, because the infection is caused by a virus, not by bacteria. Treatment may include:  Increased fluid intake. Sports drinks offer valuable electrolytes, sugars, and fluids.  Breathing heated mist or steam (vaporizer or shower).  Eating chicken soup or other clear broths, and maintaining good nutrition.    Getting plenty of rest.  Using gargles or lozenges for comfort.  Controlling fevers with ibuprofen or acetaminophen as directed by your caregiver.  Increasing usage of your inhaler if you have asthma.  Return to work when your temperature has returned to normal.   SEEK MEDICAL CARE IF:  After the first few days, you feel you are getting worse rather than better.  You develop worsening shortness of breath, or brown or red sputum. These may be signs of pneumonia.  You develop yellow or brown nasal discharge or pain in the face, especially when you bend forward. These may be signs of sinusitis.  You develop a fever, swollen neck glands, pain with swallowing, or white areas in the back of your throat. These may be signs of strep throat.    Trichomoniasis Trichomoniasis is an infection, caused by the Trichomonas organism, that affects both women and men. In women, the outer female genitalia and the vagina are affected. In men, the penis is mainly affected, but the prostate and other reproductive organs can also be involved. Trichomoniasis is a sexually transmitted disease (STD) and is most often passed to another person through sexual contact. The majority of people who get trichomoniasis do so from a sexual encounter and are also at risk for other STDs. CAUSES   Sexual intercourse with an infected partner.  It can be present in swimming pools or hot tubs. SYMPTOMS   Abnormal gray-green frothy vaginal discharge in women.  Vaginal itching and irritation in women.  Itching and irritation of the area outside the vagina in women.  Penile discharge with or without pain in males.  Inflammation of the urethra (urethritis), causing painful urination.  Bleeding after sexual intercourse. RELATED COMPLICATIONS  Pelvic inflammatory disease.  Infection of the uterus (endometritis).  Infertility.  Tubal (ectopic) pregnancy.  It can be associated with other STDs, including gonorrhea and  chlamydia, hepatitis B, and HIV. COMPLICATIONS DURING PREGNANCY  Early (premature) delivery.  Premature rupture of the membranes (PROM).  Low birth weight. DIAGNOSIS   Visualization of Trichomonas under the microscope from the vagina discharge.  Ph of the vagina greater than 4.5, tested with a test tape.  Trich Rapid Test.  Culture of the organism, but this is not usually needed.  It may be found on a Pap test.  Having a "strawberry cervix,"which means the cervix looks very red like a strawberry. TREATMENT   You may be given medication to fight the infection. Inform your caregiver if you could be or are pregnant. Some medications used to treat the infection should not be taken during pregnancy.  Over-the-counter medications or creams to decrease itching or irritation may be recommended.  Your sexual partner will need to be treated if infected. HOME CARE INSTRUCTIONS   Take all medication prescribed by your caregiver.  Take over-the-counter medication for itching or irritation as directed by your caregiver.  Do not have sexual intercourse while you have the infection.  Do not douche or wear tampons.  Discuss your infection with your partner, as your partner may have acquired the infection from you. Or, your partner may have been the person who transmitted the infection to you.  Have your sex partner examined and treated if necessary.  Practice safe, informed, and protected sex.  See your caregiver for other STD testing. SEEK MEDICAL CARE IF:   You still have symptoms after you finish the medication.  You have an oral temperature above 102 F (38.9 C).  You develop belly (abdominal) pain.  You have pain when you urinate.  You have bleeding after sexual intercourse.  You develop a rash.  The medication makes you sick or makes you throw up (vomit). Document Released: 11/22/2000 Document Revised: 08/21/2011 Document Reviewed: 12/18/2008 [facility] Patient  Information 2014 [place], [place].     Emergency Department Resource Guide 1) Find a Doctor and Pay Out of Pocket Although you won't have to find out who is covered by your insurance plan, it is a good idea to ask around and get recommendations. You will then need to call the office and see if the doctor you have chosen will accept you as a new patient and what types of options they offer for patients who are self-pay. Some doctors offer discounts or will set up payment plans for their patients who do not have insurance, but you will need to ask so you aren't surprised when you get to your appointment.  2) Contact Your Local Health Department Not all health departments have doctors that can see patients for sick visits, but many do, so it is worth a call to see if yours does. If you don't know where your local health department is, you can check in your phone book. The CDC also has a tool to help you locate your state's health department, and many state websites also have listings of all of their local health departments.  3) Find a [facility] If your illness is not likely to be very severe or complicated, you may want to try a walk in clinic. These are popping up all over the country in pharmacies, drugstores, and shopping centers. They're usually  staffed by nurse practitioners or physician assistants that have been trained to treat common illnesses and complaints. They're usually fairly quick and inexpensive. However, if you have serious medical issues or chronic medical problems, these are probably not your best option.  No Primary Care Doctor: - Call Health Connect at  [phone] - they can help you locate a primary care doctor that  accepts your insurance, provides certain services, etc. - Physician Referral Service- [phone]  Chronic Pain Problems: Organization         Address  Phone   Notes  [facility]  [phone] Patients need to be referred by their  primary care doctor.   Medication Assistance: Organization         Address  Phone   Notes  [facility] Medication [facility] [address] [place], [place] [address] [phone] --Must be a resident of [facility] -- Must have NO insurance coverage whatsoever (no Medicaid/ Medicare, etc.) -- The pt. MUST have a primary care doctor that directs their care regularly and follows them in the community   MedAssist  [phone]   [employer]  [phone]    Agencies that provide inexpensive medical care: Organization         Address  Phone   Notes  [place]  [phone]   [facility]    [phone]   [facility] [place], [place] [address] [phone]   [place] [address] [place] [phone]   Planned Parenthood    [phone]   [facility]    [phone]   [place] and [place] [address], [place] Phone:  [phone], Fax:  [phone] Hours of Operation:  9 am - 6 pm, M-F.  Also accepts Medicaid/Medicare and self-pay.  [facility] for Susan [place], [address], [place] Phone: [phone], Fax: [phone]. Hours of Operation:  8:30 am - 5:30 pm, M-F.  Also accepts Medicaid and self-pay.  [facility] [address], [place] Phone: [phone]   [place], [place], [place] [phone], Ext. [phone] Mondays & Thursdays: 7-9 AM.  First 15 patients are seen on a first come, first serve basis.    [place] Providers:  Organization         Address  Phone   Notes  [facility] [address], [place] [phone] Also accepts self-pay patients.  [facility] [id number] [place], [place]  [phone]   [place], [address], [place] [phone]   [facility] [address] [place] [phone]   Lucianne Lei [address], [place]   [phone] Only accepts [place] Access [place] patients after they have their name applied to their card.   Self-Pay (no insurance) in [facility]:  Organization         Address  Phone   Notes  Sickle Cell Patients, [facility] [place] [phone]   [facility] [place] [phone]   [facility] [place]  [address], [address], [place] [phone]   [facility]/Dr. Osei-Bonsu  [address] [place] or [address], [place] [phone] Phone number for both [place] and [place] locations is the same.  Urgent Medical and [facility] [address] [place] [phone]   [facility] [address], [place] or [address] [phone]   [facility] [address], [place] [phone], phone; [phone], fax Sees patients 1st and 3rd Saturday of every month.  Must not qualify for public or private insurance (i.e. Medicaid, Medicare, [place] Health Choice, Veterans' Benefits) . Household income should be no more than 200% of the poverty level .The clinic cannot treat you if you are pregnant or think you are pregnant . Sexually transmitted diseases are not treated at the clinic.    Dental Care: Organization         Address  Phone  Notes  [facility] of [facility] [phone] Accepts children up to age 88 who are enrolled in [place] or [place]; pregnant women with a Medicaid card; and children who have applied for Medicaid or Mobile Health Choice, but were declined, whose parents can pay a reduced fee  at time of service.  [facility] Department of [facility]  [address], [place] [phone] Accepts children up to age 41 who are enrolled in [place] or [place]; pregnant women with a Medicaid card; and children who have applied for Medicaid or  Health Choice, but were declined, whose parents can pay a reduced fee at time of service.  [place] Adult Dental Access PROGRAM  [place] [phone] Patients are seen by appointment only. Walk-ins are not accepted. [place] will see patients 7 years of age and older. Monday - Tuesday (8am-5pm) Most Wednesdays (8:30-5pm) $30 per visit, cash only  [facility] Adult Dental Access PROGRAM  [address], [facility] [phone] Patients are seen by appointment only. Walk-ins are not accepted. [place] will see patients 63 years of age and older. One Wednesday Evening (Monthly: Volunteer Based).  $30 per visit, cash only  [place]  [phone] for adults; Children under age 92, call Graduate Pediatric Dentistry at [phone]. Children aged 81-14, please call [phone] to request a pediatric application.  Dental services are provided in all areas of dental care including fillings, crowns and bridges, complete and partial dentures, implants, gum treatment, root canals, and extractions. Preventive care is also provided. Treatment is provided to both adults and children. Patients are selected via a lottery and there is often a waiting list.   [facility] [address] [place]  [phone] [email or web address]   [facility] [address] [place], [place] [phone], Ext. [phone] Second and Fourth Thursday of each month, opens at 6:30 AM; Clinic ends at 9 AM.  Patients are seen on a first-come first-served basis, and a limited number are seen during each clinic.   [facility]  [address] [place], [place] [phone]   Eligibility Requirements You must have lived in [place], [place], or [place] counties for at least the last three months.   You cannot be eligible for state or federal sponsored [employer], including [employer], [place], or [employer].   You generally cannot be eligible for healthcare insurance through your  employer.    How to apply: Eligibility screenings are held every Tuesday and Wednesday afternoon from 1:00 pm until 4:00 pm. You do not need an appointment for the interview!  [facility] [address], [place], [place]   [place]  [place] Department  [place]  [phone]    Behavioral Health Resources in the Community: Intensive Outpatient Programs Organization         Address  Phone  Notes  [place]. [address] [place], [place] [phone]   [facility] Outpatient [address], [place], [place]   ADS: Alcohol & Drug Svcs [address], [place], [place]   [place] [address]  [place], [place] or [phone]   Substance Abuse Resources Organization         Address  Phone  Notes  Alcohol and Drug Services  [phone]   [place]  [phone]   [facility]   Chinita Pester  [phone]   Residential & Outpatient Substance Abuse Program  [phone]   Psychological Services Organization         Address  Phone  Notes  [facility] [place]  [place]  [phone]   [place] [address], [place] or [phone]    Mobile Crisis Teams Organization         Address  Phone  Notes  [facility], Mobile Crisis Care Unit  [phone]   Assertive Psychotherapeutic  Services  [address] [place], [place]   [place] Levels [address] [place] [phone]    Self-Help/Support Groups Organization         Address  Phone             Notes  [place]. of [place] - variety of support groups  [place] Call for more information  Narcotics Anonymous (NA), [facility] [address], [employer] [place]  2 meetings at this location   Special educational needs teacher         Address  Phone  Notes  ASAP Residential Treatment [place],    [place]  [phone]   [facility]  [address], [place] [id number], [place], [place]   [place], [place] [phone] Admissions: 8am-3pm M-F  Incentives Substance [place] [address]    [place], [place] [id number]   [facility] [address] [place], [place], [place]   [facility] [address]  [place], [place]   [facility] - Intensive Outpatient [address] Kristeen [address], [place], [place]   [facility] ([place].,  [place], [place] [phone] or [phone]   [facility] Services (RTS) [address] [place], [place] Accepts Medicaid  [facility] [place] [address],  [place] [phone] Substance Abuse/Addiction Treatment   [facility] Organization         Address  Phone  Notes  [facility]  [phone]   Domenic Schwab, PhD [address] [place], [place]   [phone] or [phone]   [place], [place] [phone]   [facility] [address] [place], [place] [phone] Insurance/Medicaid/sponsorship through [employer] and Families [address], [address] [place], [place] [phone] [place], [place] [phone]    Dr. Adele Schilder  [phone]   [facility] [place] Dept. 1) [address] [place] 2) [place] 3)  [place] [address], [place] [phone]  [phone]   [place] [phone] or [phone] (After Hours)

## 2013-08-24 NOTE — ED Provider Notes (Signed)
CSN: [id number]     Arrival date & time 08/23/13  2238 History   First MD Initiated Contact with Patient 08/23/13 2357     Chief Complaint  Patient presents with  . Cough  . Fever  . Vaginal Discharge  . Headache     (Consider location/radiation/quality/duration/timing/severity/associated sxs/prior Treatment) The history is provided by the patient and medical records. No language interpreter was used.    Laurie Bell is a 33 y.o. female  [phone] with no medical Hx presents to the Emergency Department complaining of gradual, persistent, progressively worsening cough, chest congetstion onset 2 days ago. Associated symptoms include rhinorrhea, sore throat, myalgias.  Her cough is productive of yellow sputum.  She has had sick contacts.  She has not attempted any OTC treatments.  Nothing makes it better or worse.  Pt denies fever, chills, headache, neck pain, chest pain, SOB, abd pain, N/V/D, weakness, dysuria, hematuria.  In addition her ex partner reported to her that he has Trich and wants to be checked. She reports a hx of this but no gonorrhea or chlamydia in the past.  She reports noticing vaginal discharge today as well.  LMP: 08/07/13   Past Medical History  Diagnosis Date  . No pertinent past medical history    Past Surgical History  Procedure Laterality Date  . Tubal ligation     History reviewed. No pertinent family history. History  Substance Use Topics  . Smoking status: Never Smoker   . Smokeless tobacco: Not on file  . Alcohol Use: No   OB History   Grav Para Term Preterm Abortions TAB SAB Ect Mult Living   2 2 2  0 0 0 0 0 0 2     Review of Systems  Constitutional: Positive for fatigue. Negative for fever, chills, diaphoresis, appetite change and unexpected weight change.  HENT: Positive for congestion, postnasal drip, rhinorrhea, sinus pressure and sore throat. Negative for ear discharge, ear pain and mouth sores.   Eyes: Negative for visual disturbance.   Respiratory: Positive for cough. Negative for chest tightness, shortness of breath, wheezing and stridor.   Cardiovascular: Negative for chest pain, palpitations and leg swelling.  Gastrointestinal: Negative for nausea, vomiting, abdominal pain, diarrhea and constipation.  Endocrine: Negative for polydipsia, polyphagia and polyuria.  Genitourinary: Positive for vaginal discharge. Negative for dysuria, urgency, frequency and hematuria.  Musculoskeletal: Negative for arthralgias, back pain, myalgias and neck stiffness.  Skin: Negative for rash.  Allergic/Immunologic: Negative for immunocompromised state.  Neurological: Positive for headaches (generalized). Negative for syncope, light-headedness and numbness.  Hematological: Negative for adenopathy. Does not bruise/bleed easily.  Psychiatric/Behavioral: Negative for sleep disturbance. The patient is not nervous/anxious.   All other systems reviewed and are negative.      Allergies  Review of patient's allergies indicates no known allergies.  Home Medications   Current Outpatient Rx  Name  Route  Sig  Dispense  Refill  . Chlorphen-Pseudoephed-APAP (THERAFLU FLU/COLD PO)   Oral   Take 1 Package by mouth 2 (two) times daily as needed (cold symptoms).         . pseudoephedrine-acetaminophen (TYLENOL SINUS) 30-500 MG TABS   Oral   Take 1 tablet by mouth every 4 (four) hours as needed (cold symptoms).         . benzonatate (TESSALON) 100 MG capsule   Oral   Take 1 capsule (100 mg total) by mouth every 8 (eight) hours.   21 capsule   0    BP 118/76  Pulse 98  Temp(Src) 99.1 F (37.3 C) (Oral)  Resp 19  Ht 5\' 6"  (1.676 m)  Wt 162 lb (73.483 kg)  BMI 26.16 kg/m2  SpO2 98%  LMP 08/06/2013 Physical Exam  Nursing note and vitals reviewed. Constitutional: She is oriented to person, place, and time. She appears well-developed and well-nourished. No distress.  Awake, alert, nontoxic appearance  HENT:  Head: Normocephalic and  atraumatic.  Right Ear: Tympanic membrane, external ear and ear canal normal.  Left Ear: Tympanic membrane, external ear and ear canal normal.  Nose: Mucosal edema and rhinorrhea present. No epistaxis. Right sinus exhibits no maxillary sinus tenderness and no frontal sinus tenderness. Left sinus exhibits no maxillary sinus tenderness and no frontal sinus tenderness.  Mouth/Throat: Uvula is midline, oropharynx is clear and moist and mucous membranes are normal. Mucous membranes are not pale and not cyanotic. No oropharyngeal exudate, posterior oropharyngeal edema, posterior oropharyngeal erythema or tonsillar abscesses.  Eyes: Conjunctivae are normal. Pupils are equal, round, and reactive to light. No scleral icterus.  Neck: Normal range of motion and full passive range of motion without pain. Neck supple.  Cardiovascular: Normal rate, regular rhythm, normal heart sounds and intact distal pulses.   No murmur heard. No tachycardia  Pulmonary/Chest: Effort normal and breath sounds normal. No stridor. No respiratory distress. She has no wheezes.  Clear and equal breath sounds without wheezes, Rales or rhonchi  Abdominal: Soft. Bowel sounds are normal. She exhibits no mass. There is no hepatosplenomegaly. There is no tenderness. There is no rebound, no guarding and no CVA tenderness. Hernia confirmed negative in the right inguinal area and confirmed negative in the left inguinal area.  Abdomen soft and nontender  Genitourinary: Uterus normal. Pelvic exam was performed with patient supine. There is no rash, tenderness, lesion or injury on the right labia. There is no rash, tenderness, lesion or injury on the left labia. Uterus is not deviated, not enlarged, not fixed and not tender. Cervix exhibits friability. Cervix exhibits no motion tenderness and no discharge. Right adnexum displays no mass, no tenderness and no fullness. Left adnexum displays no mass, no tenderness and no fullness. No erythema,  tenderness or bleeding around the vagina. No foreign body around the vagina. No signs of injury around the vagina. Vaginal discharge (frothy, green, malodorous, copious) found.  Frothy vaginal discharge Friable cervix No cervical motion tenderness  Musculoskeletal: Normal range of motion. She exhibits no edema.  Lymphadenopathy:    She has no cervical adenopathy.       Right: No inguinal adenopathy present.       Left: No inguinal adenopathy present.  Neurological: She is alert and oriented to person, place, and time. She exhibits normal muscle tone. Coordination normal.  Speech is clear and goal oriented Moves extremities without ataxia  Skin: Skin is warm and dry. No rash noted. She is not diaphoretic. No erythema.  Psychiatric: She has a normal mood and affect.    ED Course  Procedures (including critical care time) Labs Review Labs Reviewed  WET PREP, GENITAL - Abnormal; Notable for the following:    Trich, Wet Prep FEW (*)    Clue Cells Wet Prep HPF POC FEW (*)    WBC, Wet Prep HPF POC FEW (*)    All other components within normal limits  URINALYSIS, ROUTINE W REFLEX MICROSCOPIC - Abnormal; Notable for the following:    APPearance CLOUDY (*)    Hgb urine dipstick MODERATE (*)    All other components within  normal limits  URINE MICROSCOPIC-ADD ON - Abnormal; Notable for the following:    Squamous Epithelial / LPF FEW (*)    All other components within normal limits  GC/CHLAMYDIA PROBE AMP  RPR  HIV ANTIBODY (ROUTINE TESTING)  POC URINE PREG, ED   Imaging Review No results found.   EKG Interpretation None      MDM   Final diagnoses:  Viral URI with cough  Trichomonas infection  Screen for STD (sexually transmitted disease)   Laurie Bell presents with viral URI symptoms and vaginal discharge. Patient reports recent exposure to trichomonas. Pt will in, afebrile with clear and equal breath sounds. No concern for pneumonia, no x-ray indicated at this time.  Patients symptoms are consistent with URI, likely viral etiology. Discussed that antibiotics are not indicated for viral infections. Pt will be discharged with symptomatic treatment.  Verbalizes understanding and is agreeable with plan.   Patient to be discharged with instructions to follow up with OBGYN. Discussed importance of using protection when sexually active. Pt understands that they have GC/Chlamydia cultures pending and that they will need to inform all sexual partners if results return positive. Pt has been treated for Trichomonas. Few white blood cells, will culture gonorrhea and Chlamydia.  Patient also pending HIV and syphilis. Pt not concerning for PID because hemodynamically stable and no cervical motion tenderness on pelvic exam.   UA without evidence of urinary tract infection.  It has been determined that no acute conditions requiring further emergency intervention are present at this time. The patient/guardian have been advised of the diagnosis and plan. We have discussed signs and symptoms that warrant return to the ED, such as changes or worsening in symptoms.   Vital signs are stable at discharge.   BP 118/76  Pulse 98  Temp(Src) 99.1 F (37.3 C) (Oral)  Resp 19  Ht 5\' 6"  (1.676 m)  Wt 162 lb (73.483 kg)  BMI 26.16 kg/m2  SpO2 98%  LMP 08/06/2013  Patient/guardian has voiced understanding and agreed to follow-up with the PCP or specialist.       Abigail Butts, PA-C 08/24/13 0140

## 2013-08-25 LAB — GC/CHLAMYDIA PROBE AMP
CT Probe RNA: NEGATIVE
GC Probe RNA: POSITIVE — AB

## 2013-08-25 NOTE — ED Provider Notes (Signed)
Medical screening examination/treatment/procedure(s) were performed by non-physician practitioner and as supervising physician I was immediately available for consultation/collaboration.   EKG Interpretation None        Mirna Mires, MD 08/25/13 [phone]

## 2013-12-11 ENCOUNTER — Encounter (HOSPITAL_COMMUNITY): Payer: Self-pay | Admitting: Emergency Medicine

## 2013-12-11 ENCOUNTER — Emergency Department (HOSPITAL_COMMUNITY)
Admission: EM | Admit: 2013-12-11 | Discharge: 2013-12-11 | Disposition: A | Discharge status: Home / Self Care | Payer: PRIVATE HEALTH INSURANCE | Attending: Emergency Medicine | Admitting: Emergency Medicine

## 2013-12-11 DIAGNOSIS — A5901 Trichomonal vulvovaginitis: Secondary | ICD-10-CM | POA: Insufficient documentation

## 2013-12-11 DIAGNOSIS — A499 Bacterial infection, unspecified: Secondary | ICD-10-CM | POA: Insufficient documentation

## 2013-12-11 DIAGNOSIS — Z79899 Other long term (current) drug therapy: Secondary | ICD-10-CM | POA: Insufficient documentation

## 2013-12-11 DIAGNOSIS — Z792 Long term (current) use of antibiotics: Secondary | ICD-10-CM | POA: Insufficient documentation

## 2013-12-11 DIAGNOSIS — N760 Acute vaginitis: Secondary | ICD-10-CM | POA: Insufficient documentation

## 2013-12-11 DIAGNOSIS — B9689 Other specified bacterial agents as the cause of diseases classified elsewhere: Secondary | ICD-10-CM | POA: Insufficient documentation

## 2013-12-11 LAB — URINALYSIS, ROUTINE W REFLEX MICROSCOPIC
Bilirubin Urine: NEGATIVE
Glucose, UA: NEGATIVE mg/dL
Hgb urine dipstick: NEGATIVE
Ketones, ur: NEGATIVE mg/dL
Nitrite: NEGATIVE
Protein, ur: NEGATIVE mg/dL
Specific Gravity, Urine: 1.024 (ref 1.005–1.030)
Urobilinogen, UA: 1 mg/dL (ref 0.0–1.0)
pH: 7 (ref 5.0–8.0)

## 2013-12-11 LAB — WET PREP, GENITAL: Yeast Wet Prep HPF POC: NONE SEEN

## 2013-12-11 LAB — URINE MICROSCOPIC-ADD ON

## 2013-12-11 MED ORDER — METRONIDAZOLE 500 MG PO TABS: 500.0000 mg | ORAL_TABLET | Freq: Two times a day (BID) | ORAL | Status: DC

## 2013-12-11 MED ORDER — CEFTRIAXONE SODIUM 250 MG IJ SOLR
250.0000 mg | Freq: Once | INTRAMUSCULAR | Status: AC
  Administered 2013-12-11: 250 mg via INTRAMUSCULAR
  Filled 2013-12-11: qty 250

## 2013-12-11 MED ORDER — FLUCONAZOLE 150 MG PO TABS: 150.0000 mg | ORAL_TABLET | Freq: Once | ORAL | Status: AC

## 2013-12-11 MED ORDER — DOXYCYCLINE HYCLATE 100 MG PO CAPS: 100.0000 mg | ORAL_CAPSULE | Freq: Two times a day (BID) | ORAL | Status: DC

## 2013-12-11 MED ORDER — LIDOCAINE HCL 1 % IJ SOLN
INTRAMUSCULAR | Status: AC
  Administered 2013-12-11: 0.9 mL
  Filled 2013-12-11: qty 20

## 2013-12-11 NOTE — ED Notes (Signed)
Pelvic supplies at bedside. 

## 2013-12-11 NOTE — ED Provider Notes (Signed)
CSN: [id number]     Arrival date & time 12/11/13  2033 History   First MD Initiated Contact with Patient 12/11/13 2043     Chief Complaint  Patient presents with  . Vaginal Discharge     (Consider location/radiation/quality/duration/timing/severity/associated sxs/prior Treatment) Patient is a 33 y.o. female presenting with vaginal discharge. The history is provided by the patient.  Vaginal Discharge Associated symptoms: no abdominal pain, no nausea and no vomiting    patient presents with vaginal discharge her last few days. She states there is an odor to it. She states is like previous bacterial infection she's had. She also states that her urine smells like medicine. No dysuria. No vaginal pain. She states she has had unprotected sex. She denies possibility of pregnancy since she has had her tubes tied. Her last menses was the middle of June and was normal. No fevers.  Past Medical History  Diagnosis Date  . No pertinent past medical history    Past Surgical History  Procedure Laterality Date  . Tubal ligation     History reviewed. No pertinent family history. History  Substance Use Topics  . Smoking status: Never Smoker   . Smokeless tobacco: Not on file  . Alcohol Use: No   OB History   Grav Para Term Preterm Abortions TAB SAB Ect Mult Living   2 2 2  0 0 0 0 0 0 2     Review of Systems  Constitutional: Negative for activity change and appetite change.  Respiratory: Negative for chest tightness.   Cardiovascular: Negative for chest pain and leg swelling.  Gastrointestinal: Negative for nausea, vomiting, abdominal pain and diarrhea.  Genitourinary: Positive for vaginal discharge. Negative for flank pain and vaginal bleeding.  Musculoskeletal: Negative for back pain and neck stiffness.  Neurological: Negative for weakness, numbness and headaches.  Psychiatric/Behavioral: Negative for behavioral problems.      Allergies  Review of patient's allergies indicates no known  allergies.  Home Medications   Prior to Admission medications   Medication Sig Start Date End Date Taking? Authorizing Provider  benzonatate (TESSALON) 100 MG capsule Take 1 capsule (100 mg total) by mouth every 8 (eight) hours. 08/24/13  Yes Hannah Muthersbaugh, PA-C  Multiple Vitamin (MULTIVITAMIN WITH MINERALS) TABS tablet Take 1 tablet by mouth daily.   Yes Historical Provider, MD  diphenhydrAMINE (BENADRYL) 25 MG tablet Take 1 tablet (25 mg total) by mouth every 6 (six) hours. 08/28/11 09/27/11  Domenic Moras, PA-C  doxycycline (VIBRAMYCIN) 100 MG capsule Take 1 capsule (100 mg total) by mouth 2 (two) times daily. 12/11/13   Jasper Riling. Chantia Amalfitano, MD  fluconazole (DIFLUCAN) 150 MG tablet Take 1 tablet (150 mg total) by mouth once. 12/11/13 12/18/13  Jasper Riling. Dawnita Molner, MD  metroNIDAZOLE (FLAGYL) 500 MG tablet Take 1 tablet (500 mg total) by mouth 2 (two) times daily. 12/11/13   Jasper Riling. Romanda Turrubiates, MD   BP 116/70  Pulse 69  Temp(Src) 98.1 F (36.7 C) (Oral)  Resp 16  Ht 5\' 6"  (1.676 m)  Wt 157 lb (71.215 kg)  BMI 25.35 kg/m2  SpO2 100%  LMP 11/22/2013 Physical Exam  Constitutional: She appears well-developed and well-nourished.  HENT:  Head: Normocephalic.  Eyes: Pupils are equal, round, and reactive to light.  Cardiovascular: Normal rate and regular rhythm.   Pulmonary/Chest: Effort normal.  Abdominal: There is no tenderness.  Genitourinary:  White vaginal discharge/cervical discharge. No cervical motion tenderness. No adnexal mass. Irregular surface of cervix around the os.  Neurological: She  is alert.  Skin: Skin is warm. No erythema.    ED Course  Procedures (including critical care time) Labs Review Labs Reviewed  WET PREP, GENITAL - Abnormal; Notable for the following:    Trich, Wet Prep MANY (*)    Clue Cells Wet Prep HPF POC MANY (*)    WBC, Wet Prep HPF POC MANY (*)    All other components within normal limits  URINALYSIS, ROUTINE W REFLEX MICROSCOPIC - Abnormal; Notable  for the following:    Leukocytes, UA MODERATE (*)    All other components within normal limits  URINE MICROSCOPIC-ADD ON - Abnormal; Notable for the following:    Bacteria, UA FEW (*)    All other components within normal limits  GC/CHLAMYDIA PROBE AMP  URINE CULTURE  RPR  HIV ANTIBODY (ROUTINE TESTING)    Imaging Review No results found.   EKG Interpretation None      MDM   Final diagnoses:  Trichomonal vaginitis  Bacterial vaginosis    Patient with vaginal discharge. Has trich and BV. Will treat for STD.  Jasper Riling. Alvino Chapel, MD 12/12/13 [phone]

## 2013-12-11 NOTE — Discharge Instructions (Signed)
Bacterial Vaginosis Bacterial vaginosis is a vaginal infection that occurs when the normal balance of bacteria in the vagina is disrupted. It results from an overgrowth of certain bacteria. This is the most common vaginal infection in women of childbearing age. Treatment is important to prevent complications, especially in pregnant women, as it can cause a premature delivery. CAUSES  Bacterial vaginosis is caused by an increase in harmful bacteria that are normally present in smaller amounts in the vagina. Several different kinds of bacteria can cause bacterial vaginosis. However, the reason that the condition develops is not fully understood. RISK FACTORS Certain activities or behaviors can put you at an increased risk of developing bacterial vaginosis, including:  Having a new sex partner or multiple sex partners.  Douching.  Using an intrauterine device (IUD) for contraception. Women do not get bacterial vaginosis from toilet seats, bedding, swimming pools, or contact with objects around them. SIGNS AND SYMPTOMS  Some women with bacterial vaginosis have no signs or symptoms. Common symptoms include:  Grey vaginal discharge.  A fishlike odor with discharge, especially after sexual intercourse.  Itching or burning of the vagina and vulva.  Burning or pain with urination. DIAGNOSIS  Your health care provider will take a medical history and examine the vagina for signs of bacterial vaginosis. A sample of vaginal fluid may be taken. Your health care provider will look at this sample under a microscope to check for bacteria and abnormal cells. A vaginal pH test may also be done.  TREATMENT  Bacterial vaginosis may be treated with antibiotic medicines. These may be given in the form of a pill or a vaginal cream. A second round of antibiotics may be prescribed if the condition comes back after treatment.  HOME CARE INSTRUCTIONS   Only take over-the-counter or prescription medicines as  directed by your health care provider.  If antibiotic medicine was prescribed, take it as directed. Make sure you finish it even if you start to feel better.  Do not have sex until treatment is completed.  Tell all sexual partners that you have a vaginal infection. They should see their health care provider and be treated if they have problems, such as a mild rash or itching.  Practice safe sex by using condoms and only having one sex partner. SEEK MEDICAL CARE IF:   Your symptoms are not improving after 3 days of treatment.  You have increased discharge or pain.  You have a fever. MAKE SURE YOU:   Understand these instructions.  Will watch your condition.  Will get help right away if you are not doing well or get worse. FOR MORE INFORMATION  Centers for Disease Control and Prevention, Division of STD Prevention: Appraiser[employer]raud.fi American Sexual Health Association (ASHA): www.ashastd.org  Document Released: 05/29/2005 Document Revised: 03/19/2013 Document Reviewed: 01/08/2013 [facility] Patient Information 2015 [place], [place]. This information is not intended to replace advice given to you by your health care provider. Make sure you discuss any questions you have with your health care provider.  Trichomoniasis Trichomoniasis is an infection caused by an organism called Trichomonas. The infection can affect both women and men. In women, the outer female genitalia and the vagina are affected. In men, the penis is mainly affected, but the prostate and other reproductive organs can also be involved. Trichomoniasis is a sexually transmitted infection (STI) and is most often passed to another person through sexual contact.  RISK FACTORS  Having unprotected sexual intercourse.  Having sexual intercourse with an infected partner. SIGNS AND  SYMPTOMS  Symptoms of trichomoniasis in women include:  Abnormal gray-green frothy vaginal discharge.  Itching and irritation of the  vagina.  Itching and irritation of the area outside the vagina. Symptoms of trichomoniasis in men include:   Penile discharge with or without pain.  Pain during urination. This results from inflammation of the urethra. DIAGNOSIS  Trichomoniasis may be found during a Pap test or physical exam. Your health care provider may use one of the following methods to help diagnose this infection:  Examining vaginal discharge under a microscope. For men, urethral discharge would be examined.  Testing the pH of the vagina with a test tape.  Using a vaginal swab test that checks for the Trichomonas organism. A test is available that provides results within a few minutes.  Doing a culture test for the organism. This is not usually needed. TREATMENT   You may be given medicine to fight the infection. Women should inform their health care provider if they could be or are pregnant. Some medicines used to treat the infection should not be taken during pregnancy.  Your health care provider may recommend over-the-counter medicines or creams to decrease itching or irritation.  Your sexual partner will need to be treated if infected. HOME CARE INSTRUCTIONS   Take all medicine prescribed by your health care provider.  Take over-the-counter medicine for itching or irritation as directed by your health care provider.  Do not have sexual intercourse while you have the infection.  Women should not douche or wear tampons while they have the infection.  Discuss your infection with your partner. Your partner may have gotten the infection from you, or you may have gotten it from your partner.  Have your sex partner get examined and treated if necessary.  Practice safe, informed, and protected sex.  See your health care provider for other STI testing. SEEK MEDICAL CARE IF:   You still have symptoms after you finish your medicine.  You develop abdominal pain.  You have pain when you urinate.  You  have bleeding after sexual intercourse.  You develop a rash.  Your medicine makes you sick or makes you throw up (vomit). Document Released: 11/22/2000 Document Revised: 06/03/2013 Document Reviewed: 03/10/2013 [facility] Patient Information 2015 [place], [place]. This information is not intended to replace advice given to you by your health care provider. Make sure you discuss any questions you have with your health care provider.

## 2013-12-11 NOTE — ED Notes (Signed)
Pt complains of vaginal irritation with an odor for one week

## 2013-12-12 LAB — HIV ANTIBODY (ROUTINE TESTING W REFLEX): HIV 1&2 Ab, 4th Generation: NONREACTIVE

## 2013-12-12 LAB — RPR

## 2013-12-13 LAB — GC/CHLAMYDIA PROBE AMP
CT Probe RNA: NEGATIVE
GC Probe RNA: NEGATIVE

## 2013-12-13 LAB — URINE CULTURE: Colony Count: >=100000

## 2013-12-14 ENCOUNTER — Telehealth (HOSPITAL_BASED_OUTPATIENT_CLINIC_OR_DEPARTMENT_OTHER): Payer: Self-pay | Admitting: Emergency Medicine

## 2013-12-14 NOTE — Progress Notes (Signed)
ED Antimicrobial Stewardship Positive Culture Follow Up   Laurie Bell is an 33 y.o. female who presented to [facility] on 12/11/2013 with a chief complaint of  Chief Complaint  Patient presents with  . Vaginal Discharge    Recent Results (from the past 720 hour(s))  GC/CHLAMYDIA PROBE AMP     Status: None   Collection Time    12/11/13 10:13 PM      Result Value Ref Range Status   CT Probe RNA NEGATIVE  NEGATIVE Final   GC Probe RNA NEGATIVE  NEGATIVE Final   Comment: (NOTE)                                                                                               **Normal Reference Range: Negative**          Assay performed using the Gen-Probe APTIMA COMBO2 (R) Assay.     Acceptable specimen types for this assay include APTIMA Swabs (Unisex,     endocervical, urethral, or vaginal), first void urine, and ThinPrep     liquid based cytology samples.     Performed at [facility], [facility]ITAL     Status: Abnormal   Collection Time    12/11/13 10:13 PM      Result Value Ref Range Status   Yeast Wet Prep HPF POC NONE SEEN  NONE SEEN Final   Trich, Wet Prep MANY (*) NONE SEEN Final   Clue Cells Wet Prep HPF POC MANY (*) NONE SEEN Final   WBC, Wet Prep HPF POC MANY (*) NONE SEEN Final  URINE CULTURE     Status: None   Collection Time    12/11/13 10:51 PM      Result Value Ref Range Status   Specimen Description URINE, CLEAN CATCH   Final   Special Requests NONE   Final   Culture  Setup Time     Final   Value: 12/12/2013 04:08     Performed at [facility]     Final   Value: >=100,000 COLONIES/ML     Performed at [facility]   Culture     Final   Value: ESCHERICHIA COLI     Performed at [facility]   Report Status 12/13/2013 FINAL   Final   Organism ID, Bacteria ESCHERICHIA COLI   Final    [x]  Patient discharged originally without antimicrobial agent and treatment is now indicated 33 yo who presented with  vaginal discharge. She tested positive for BV and trich and was sent home on doxy,flagyl and fluconazole. Her urine came back with e.coli.   New antibiotic prescription:  Septra DS 1 PO BID x3 days  ED Provider: Hyman Bible, PA  Onnie Boer, PharmD Pager: [phone] Infectious Diseases Pharmacist Phone# [phone]

## 2013-12-14 NOTE — Telephone Encounter (Signed)
Post ED Visit - Positive Culture Follow-up: Successful Patient Follow-Up  Culture assessed and recommendations reviewed by: []  Wes Gaines, Pharm.D., BCPS []  Heide Guile, Pharm.D., BCPS []  Alycia Rossetti, Pharm.D., BCPS [x]  [place], Pharm.D., BCPS, AAHIVP []  Legrand Como, Pharm.D., BCPS, AAHIVP  Positive urine culture  [x]  Patient discharged without antimicrobial prescription and treatment is now indicated []  Organism is resistant to prescribed ED discharge antimicrobial []  Patient with positive blood cultures  Changes discussed with ED provider: Hyman Bible PA-C New antibiotic prescription: Septra DS 1 PO BID x 3 days    Myrna Blazer 12/14/2013, 12:48 PM

## 2013-12-14 NOTE — Telephone Encounter (Signed)
Rx for Septra DS 1 tab PO BID x 3 days, prescribed by Hyman Bible PA-C, called in by Georgina Peer PFM.

## 2014-02-12 ENCOUNTER — Emergency Department (HOSPITAL_COMMUNITY)
Admission: EM | Admit: 2014-02-12 | Discharge: 2014-02-12 | Disposition: A | Discharge status: Home / Self Care | Payer: PRIVATE HEALTH INSURANCE | Attending: Emergency Medicine | Admitting: Emergency Medicine

## 2014-02-12 ENCOUNTER — Encounter (HOSPITAL_COMMUNITY): Payer: Self-pay | Admitting: Emergency Medicine

## 2014-02-12 DIAGNOSIS — H66009 Acute suppurative otitis media without spontaneous rupture of ear drum, unspecified ear: Secondary | ICD-10-CM | POA: Diagnosis not present

## 2014-02-12 DIAGNOSIS — IMO0001 Reserved for inherently not codable concepts without codable children: Secondary | ICD-10-CM | POA: Diagnosis not present

## 2014-02-12 DIAGNOSIS — H66002 Acute suppurative otitis media without spontaneous rupture of ear drum, left ear: Secondary | ICD-10-CM

## 2014-02-12 DIAGNOSIS — J069 Acute upper respiratory infection, unspecified: Secondary | ICD-10-CM | POA: Diagnosis not present

## 2014-02-12 DIAGNOSIS — J3489 Other specified disorders of nose and nasal sinuses: Secondary | ICD-10-CM | POA: Diagnosis present

## 2014-02-12 MED ORDER — AMOXICILLIN 500 MG PO CAPS: 500.0000 mg | ORAL_CAPSULE | Freq: Two times a day (BID) | ORAL | Status: DC

## 2014-02-12 NOTE — Discharge Instructions (Signed)
Upper Respiratory Infection, Adult An upper respiratory infection (URI) is also sometimes known as the common cold. The upper respiratory tract includes the nose, sinuses, throat, trachea, and bronchi. Bronchi are the airways leading to the lungs. Most people improve within 1 week, but symptoms can last up to 2 weeks. A residual cough may last even longer.  CAUSES Many different viruses can infect the tissues lining the upper respiratory tract. The tissues become irritated and inflamed and often become very moist. Mucus production is also common. A cold is contagious. You can easily spread the virus to others by oral contact. This includes kissing, sharing a glass, coughing, or sneezing. Touching your mouth or nose and then touching a surface, which is then touched by another person, can also spread the virus. SYMPTOMS  Symptoms typically develop 1 to 3 days after you come in contact with a cold virus. Symptoms vary from person to person. They may include:  Runny nose.  Sneezing.  Nasal congestion.  Sinus irritation.  Sore throat.  Loss of voice (laryngitis).  Cough.  Fatigue.  Muscle aches.  Loss of appetite.  Headache.  Low-grade fever. DIAGNOSIS  You might diagnose your own cold based on familiar symptoms, since most people get a cold 2 to 3 times a year. Your caregiver can confirm this based on your exam. Most importantly, your caregiver can check that your symptoms are not due to another disease such as strep throat, sinusitis, pneumonia, asthma, or epiglottitis. Blood tests, throat tests, and X-rays are not necessary to diagnose a common cold, but they may sometimes be helpful in excluding other more serious diseases. Your caregiver will decide if any further tests are required. RISKS AND COMPLICATIONS  You may be at risk for a more severe case of the common cold if you smoke cigarettes, have chronic heart disease (such as heart failure) or lung disease (such as asthma), or if  you have a weakened immune system. The very young and very old are also at risk for more serious infections. Bacterial sinusitis, middle ear infections, and bacterial pneumonia can complicate the common cold. The common cold can worsen asthma and chronic obstructive pulmonary disease (COPD). Sometimes, these complications can require emergency medical care and may be life-threatening. PREVENTION  The best way to protect against getting a cold is to practice good hygiene. Avoid oral or hand contact with people with cold symptoms. Wash your hands often if contact occurs. There is no clear evidence that vitamin C, vitamin E, echinacea, or exercise reduces the chance of developing a cold. However, it is always recommended to get plenty of rest and practice good nutrition. TREATMENT  Treatment is directed at relieving symptoms. There is no cure. Antibiotics are not effective, because the infection is caused by a virus, not by bacteria. Treatment may include:  Increased fluid intake. Sports drinks offer valuable electrolytes, sugars, and fluids.  Breathing heated mist or steam (vaporizer or shower).  Eating chicken soup or other clear broths, and maintaining good nutrition.  Getting plenty of rest.  Using gargles or lozenges for comfort.  Controlling fevers with ibuprofen or acetaminophen as directed by your caregiver.  Increasing usage of your inhaler if you have asthma. Zinc gel and zinc lozenges, taken in the first 24 hours of the common cold, can shorten the duration and lessen the severity of symptoms. Pain medicines may help with fever, muscle aches, and throat pain. A variety of non-prescription medicines are available to treat congestion and runny nose. Your caregiver   can make recommendations and may suggest nasal or lung inhalers for other symptoms.  °HOME CARE INSTRUCTIONS  °· Only take over-the-counter or prescription medicines for pain, discomfort, or fever as directed by your  caregiver. °· Use a warm mist humidifier or inhale steam from a shower to increase air moisture. This may keep secretions moist and make it easier to breathe. °· Drink enough water and fluids to keep your urine clear or pale yellow. °· Rest as needed. °· Return to work when your temperature has returned to normal or as your caregiver advises. You may need to stay home longer to avoid infecting others. You can also use a face mask and careful hand washing to prevent spread of the virus. °SEEK MEDICAL CARE IF:  °· After the first few days, you feel you are getting worse rather than better. °· You need your caregiver's advice about medicines to control symptoms. °· You develop chills, worsening shortness of breath, or brown or red sputum. These may be signs of pneumonia. °· You develop yellow or brown nasal discharge or pain in the face, especially when you bend forward. These may be signs of sinusitis. °· You develop a fever, swollen neck glands, pain with swallowing, or white areas in the back of your throat. These may be signs of strep throat. °SEEK IMMEDIATE MEDICAL CARE IF:  °· You have a fever. °· You develop severe or persistent headache, ear pain, sinus pain, or chest pain. °· You develop wheezing, a prolonged cough, cough up blood, or have a change in your usual mucus (if you have chronic lung disease). °· You develop sore muscles or a stiff neck. °Document Released: 11/22/2000 Document Revised: 08/21/2011 Document Reviewed: 09/03/2013 °[employer]® Patient Information ©2015 [employer]. This information is not intended to replace advice given to you by your health care provider. Make sure you discuss any questions you have with your health care provider. ° °Otitis Media °Otitis media is redness, soreness, and inflammation of the middle ear. Otitis media may be caused by allergies or, most commonly, by infection. Often it occurs as a complication of the common cold. °SIGNS AND SYMPTOMS °Symptoms of otitis media  may include: °· Earache. °· Fever. °· Ringing in your ear. °· Headache. °· Leakage of fluid from the ear. °DIAGNOSIS °To diagnose otitis media, your health care provider will examine your ear with an otoscope. This is an instrument that allows your health care provider to see into your ear in order to examine your eardrum. Your health care provider also will ask you questions about your symptoms. °TREATMENT  °Typically, otitis media resolves on its own within 3-5 days. Your health care provider may prescribe medicine to ease your symptoms of pain. If otitis media does not resolve within 5 days or is recurrent, your health care provider may prescribe antibiotic medicines if he or she suspects that a bacterial infection is the cause. °HOME CARE INSTRUCTIONS  °· If you were prescribed an antibiotic medicine, finish it all even if you start to feel better. °· Take medicines only as directed by your health care provider. °· Keep all follow-up visits as directed by your health care provider. °SEEK MEDICAL CARE IF: °· You have otitis media only in one ear, or bleeding from your nose, or both. °· You notice a lump on your neck. °· You are not getting better in 3-5 days. °· You feel worse instead of better. °SEEK IMMEDIATE MEDICAL CARE IF:  °· You have pain that is not   controlled with medicine. °· You have swelling, redness, or pain around your ear or stiffness in your neck. °· You notice that part of your face is paralyzed. °· You notice that the bone behind your ear (mastoid) is tender when you touch it. °MAKE SURE YOU:  °· Understand these instructions. °· Will watch your condition. °· Will get help right away if you are not doing well or get worse. °Document Released: 03/03/2004 Document Revised: 10/13/2013 Document Reviewed: 12/24/2012 °[employer]® Patient Information ©2015 [employer]. This information is not intended to replace advice given to you by your health care provider. Make sure you discuss any questions you  have with your health care provider. ° °

## 2014-02-12 NOTE — ED Notes (Signed)
Pt reports hx of allergies. Reports sinus congestion started last night pain 8/10. Denies cough, n/v/d.

## 2014-02-14 NOTE — ED Provider Notes (Signed)
CSN: [id number]     Arrival date & time 02/12/14  1540 History   First MD Initiated Contact with Patient 02/12/14 [phone]     Chief Complaint  Patient presents with  . sinus pain      (Consider location/radiation/quality/duration/timing/severity/associated sxs/prior Treatment) Patient is a 33 y.o. female presenting with general illness.  Illness Location:  Sinus, l ear Quality:  Pain, malaise Severity:  Moderate Onset quality:  Gradual Duration:  2 days Timing:  Constant Progression:  Unchanged Chronicity:  New Context:  URI symptoms, no sore throat Relieved by:  Nothing Worsened by:  Nothing Associated symptoms: cough (nonproductive) and myalgias   Associated symptoms: no abdominal pain, no chest pain, no congestion, no diarrhea, no fever, no nausea, no rash, no rhinorrhea, no shortness of breath, no sore throat and no vomiting     Past Medical History  Diagnosis Date  . No pertinent past medical history    Past Surgical History  Procedure Laterality Date  . Tubal ligation     History reviewed. No pertinent family history. History  Substance Use Topics  . Smoking status: Never Smoker   . Smokeless tobacco: Not on file  . Alcohol Use: No   OB History   Grav Para Term Preterm Abortions TAB SAB Ect Mult Living   2 2 2  0 0 0 0 0 0 2     Review of Systems  Constitutional: Negative for fever and chills.  HENT: Negative for congestion, rhinorrhea and sore throat.   Eyes: Negative for photophobia and visual disturbance.  Respiratory: Positive for cough (nonproductive). Negative for shortness of breath.   Cardiovascular: Negative for chest pain and leg swelling.  Gastrointestinal: Negative for nausea, vomiting, abdominal pain, diarrhea and constipation.  Endocrine: Negative for polyphagia and polyuria.  Genitourinary: Negative for dysuria, flank pain, vaginal bleeding, vaginal discharge and enuresis.  Musculoskeletal: Positive for myalgias. Negative for back pain and gait  problem.  Skin: Negative for color change and rash.  Neurological: Negative for dizziness, syncope, light-headedness and numbness.  Hematological: Negative for adenopathy. Does not bruise/bleed easily.  All other systems reviewed and are negative.     Allergies  Review of patient's allergies indicates no known allergies.  Home Medications   Prior to Admission medications   Medication Sig Start Date End Date Taking? Authorizing Provider  amoxicillin (AMOXIL) 500 MG capsule Take 1 capsule (500 mg total) by mouth 2 (two) times daily. 02/12/14   Debby Freiberg, MD   BP 110/73  Pulse 70  Temp(Src) 97.5 F (36.4 C) (Oral)  Resp 18  SpO2 100%  LMP 02/05/2014 Physical Exam  Vitals reviewed. Constitutional: She is oriented to person, place, and time. She appears well-developed and well-nourished.  HENT:  Head: Normocephalic and atraumatic.  Right Ear: External ear normal.  Left Ear: External ear normal. Tympanic membrane is bulging. A middle ear effusion is present.  Eyes: Conjunctivae and EOM are normal. Pupils are equal, round, and reactive to light.  Neck: Normal range of motion. Neck supple.  Cardiovascular: Normal rate, regular rhythm, normal heart sounds and intact distal pulses.   Pulmonary/Chest: Effort normal and breath sounds normal.  Abdominal: Soft. Bowel sounds are normal. There is no tenderness.  Musculoskeletal: Normal range of motion.  Neurological: She is alert and oriented to person, place, and time.  Skin: Skin is warm and dry.    ED Course  Procedures (including critical care time) Labs Review Labs Reviewed - No data to display  Imaging Review No results  found.   EKG Interpretation None      MDM   Final diagnoses:  Upper respiratory infection  Acute suppurative otitis media of left ear without spontaneous rupture of tympanic membrane, recurrence not specified    33 y.o. female  without pertinent PMH presents with signs symptoms consistent with a  URI, L AOM.  Physical exam as above not concerning for PNA, no cough, afebrile here.  Amox prescribed for AOM.  Pt dc home with standard return precautions.    Labs and imaging as above reviewed.   1. Upper respiratory infection   2. Acute suppurative otitis media of left ear without spontaneous rupture of tympanic membrane, recurrence not specified         Debby Freiberg, MD 02/14/14 1431

## 2014-02-23 ENCOUNTER — Emergency Department (HOSPITAL_COMMUNITY): Payer: PRIVATE HEALTH INSURANCE

## 2014-02-23 ENCOUNTER — Encounter (HOSPITAL_COMMUNITY): Payer: Self-pay | Admitting: Emergency Medicine

## 2014-02-23 ENCOUNTER — Emergency Department (HOSPITAL_COMMUNITY)
Admission: EM | Admit: 2014-02-23 | Discharge: 2014-02-24 | Disposition: A | Discharge status: Home / Self Care | Payer: PRIVATE HEALTH INSURANCE | Attending: Emergency Medicine | Admitting: Emergency Medicine

## 2014-02-23 DIAGNOSIS — R519 Headache, unspecified: Secondary | ICD-10-CM

## 2014-02-23 DIAGNOSIS — R05 Cough: Secondary | ICD-10-CM | POA: Diagnosis present

## 2014-02-23 DIAGNOSIS — R51 Headache: Secondary | ICD-10-CM | POA: Insufficient documentation

## 2014-02-23 DIAGNOSIS — J029 Acute pharyngitis, unspecified: Secondary | ICD-10-CM | POA: Diagnosis not present

## 2014-02-23 DIAGNOSIS — R059 Cough, unspecified: Secondary | ICD-10-CM | POA: Insufficient documentation

## 2014-02-23 DIAGNOSIS — J0100 Acute maxillary sinusitis, unspecified: Secondary | ICD-10-CM | POA: Diagnosis not present

## 2014-02-23 NOTE — ED Notes (Signed)
Patient is alert and oriented x3.  She is complaining of a cough and sinus congestion that started  On the 2nd of this month.  Patient was seen here for the same issue on the 2nd and has not gotten Any better.  Currently she rates her pain 9 of 10.

## 2014-02-23 NOTE — ED Provider Notes (Signed)
CSN: [id number]     Arrival date & time 02/23/14  2031 History   First MD Initiated Contact with Patient 02/23/14 2338     Chief Complaint  Patient presents with  . Cough     (Consider location/radiation/quality/duration/timing/severity/associated sxs/prior Treatment) The history is provided by the patient and medical records. No language interpreter was used.    Laurie Bell is a 33 y.o. female  with no major medical problems presents to the Emergency Department complaining of gradual, persistent, cough onset 3 days ago. Associated symptoms include nasal congestion and sinus pressure beginning 2 weeks ago.  Patient has associated frontal headache without vision changes or meningismus. Pt reports she was treated for a sinus infection and ear infection, but has not improved.  Record review shows that pt was given Augmentin. The patient also reports taking a variety of over-the-counter sinus and cold medications without relief. Nothing makes it better and nothing makes it worse.  Pt denies fevers, neck pain, neck stiffness, chest pain, SOB, abd pain, N/V/D, weakness, dizziness, syncope.     Past Medical History  Diagnosis Date  . No pertinent past medical history    Past Surgical History  Procedure Laterality Date  . Tubal ligation     History reviewed. No pertinent family history. History  Substance Use Topics  . Smoking status: Never Smoker   . Smokeless tobacco: Not on file  . Alcohol Use: No   OB History   Grav Para Term Preterm Abortions TAB SAB Ect Mult Living   2 2 2  0 0 0 0 0 0 2     Review of Systems  Constitutional: Negative for fever, diaphoresis, appetite change, fatigue and unexpected weight change.  HENT: Positive for congestion, postnasal drip, rhinorrhea, sinus pressure and sore throat. Negative for mouth sores.   Eyes: Negative for visual disturbance.  Respiratory: Negative for cough, chest tightness, shortness of breath and wheezing.   Cardiovascular:  Negative for chest pain.  Gastrointestinal: Negative for nausea, vomiting, abdominal pain, diarrhea and constipation.  Endocrine: Negative for polydipsia, polyphagia and polyuria.  Genitourinary: Negative for dysuria, urgency, frequency and hematuria.  Musculoskeletal: Negative for back pain and neck stiffness.  Skin: Negative for rash.  Allergic/Immunologic: Negative for immunocompromised state.  Neurological: Positive for headaches. Negative for syncope and light-headedness.  Hematological: Does not bruise/bleed easily.  Psychiatric/Behavioral: Negative for sleep disturbance. The patient is not nervous/anxious.       Allergies  Review of patient's allergies indicates no known allergies.  Home Medications   Prior to Admission medications   Medication Sig Start Date End Date Taking? Authorizing Provider  fluticasone (FLONASE) 50 MCG/ACT nasal spray Place 2 sprays into both nostrils daily. 02/24/14   Ruthann Angulo, PA-C  guaiFENesin (MUCINEX) 600 MG 12 hr tablet Take 2 tablets (1,200 mg total) by mouth 2 (two) times daily. 02/24/14   Maclovia Uher, PA-C  levofloxacin (LEVAQUIN) 500 MG tablet Take 1 tablet (500 mg total) by mouth daily. 02/24/14   Febe Champa, PA-C   BP 115/68  Pulse 77  Temp(Src) 98.2 F (36.8 C) (Oral)  Resp 18  SpO2 100%  LMP 02/05/2014 Physical Exam  Nursing note and vitals reviewed. Constitutional: She is oriented to person, place, and time. She appears well-developed and well-nourished. No distress.  Awake, alert, nontoxic appearance  HENT:  Head: Normocephalic and atraumatic.  Right Ear: Tympanic membrane, external ear and ear canal normal.  Left Ear: Tympanic membrane, external ear and ear canal normal.  Nose: No epistaxis.  Right sinus exhibits maxillary sinus tenderness. Right sinus exhibits no frontal sinus tenderness. Left sinus exhibits maxillary sinus tenderness. Left sinus exhibits no frontal sinus tenderness.  Mouth/Throat: Uvula  is midline, oropharynx is clear and moist and mucous membranes are normal. Mucous membranes are not pale and not cyanotic. No uvula swelling. No oropharyngeal exudate, posterior oropharyngeal edema, posterior oropharyngeal erythema or tonsillar abscesses.  Significant bilateral maxillary sinus tenderness No purulent drainage noted  Eyes: Conjunctivae and EOM are normal. Pupils are equal, round, and reactive to light. No scleral icterus.  No horizontal, vertical or rotational nystagmus  Neck: Normal range of motion and full passive range of motion without pain. Neck supple.  Full active and passive ROM without pain No midline or paraspinal tenderness No nuchal rigidity or meningeal signs  Cardiovascular: Normal rate, regular rhythm, normal heart sounds and intact distal pulses.   No murmur heard. Pulmonary/Chest: Effort normal and breath sounds normal. No stridor. No respiratory distress. She has no wheezes. She has no rales.  Equal chest expansion  Abdominal: Soft. Bowel sounds are normal. She exhibits no mass. There is no tenderness. There is no rebound and no guarding.  Musculoskeletal: Normal range of motion. She exhibits no edema.  Lymphadenopathy:    She has no cervical adenopathy.  Neurological: She is alert and oriented to person, place, and time. She has normal reflexes. No cranial nerve deficit. She exhibits normal muscle tone. Coordination normal.  Mental Status:  Alert, oriented, thought content appropriate. Speech fluent without evidence of aphasia. Able to follow 2 step commands without difficulty.  Cranial Nerves:  II:  Peripheral visual fields grossly normal, pupils equal, round, reactive to light III,IV, VI: ptosis not present, extra-ocular motions intact bilaterally  V,VII: smile symmetric, facial light touch sensation equal VIII: hearing grossly normal bilaterally  IX,X: gag reflex present  XI: bilateral shoulder shrug equal and strong XII: midline tongue extension   Motor:  5/5 in upper and lower extremities bilaterally including strong and equal grip strength and dorsiflexion/plantar flexion Sensory: Pinprick and light touch normal in all extremities.  Deep Tendon Reflexes: 2+ and symmetric  Cerebellar: normal finger-to-nose with bilateral upper extremities Gait: normal gait and balance CV: distal pulses palpable throughout   Skin: Skin is warm and dry. No rash noted. She is not diaphoretic.  Psychiatric: She has a normal mood and affect. Her behavior is normal. Judgment and thought content normal.    ED Course  Procedures (including critical care time) Labs Review Labs Reviewed - No data to display  Imaging Review Dg Chest 2 View  02/24/2014   CLINICAL DATA:  Cough and shortness of breath.  EXAM: CHEST  2 VIEW  COMPARISON:  Chest radiograph performed 08/01/2011  FINDINGS: The lungs are well-aerated and clear. There is no evidence of focal opacification, pleural effusion or pneumothorax.  The heart is normal in size; the mediastinal contour is within normal limits. No acute osseous abnormalities are seen.  IMPRESSION: No acute cardiopulmonary process seen.   Electronically Signed   By: Garald Balding M.D.   On: 02/24/2014 00:59     EKG Interpretation None      MDM   Final diagnoses:  Acute maxillary sinusitis, recurrence not specified  Sinus headache   Laurie Bell presents with maxillary sinus pain for > 2 weeks after taking augmentin without relief. Bilateral maxillary sinus tenderness on exam; no frontal sinus tenderness.  Patient complaining of symptoms of sinusitis.   Pt HA treated and improved while in ED.  Presentation is non concerning for [facility], [facility], Meningitis, or temporal arteritis. Pt is afebrile with no focal neuro deficits, nuchal rigidity, or change in vision.  Severe symptoms have been present for greater than 10 days with reports of purulent nasal discharge and significant maxillary sinus pain on physical exam.  Concern  for acute bacterial rhinosinusitis; failed initial therapy with augmentin.  Patient discharged with Levaquin, flonase and mucinex.  Instructions given for warm saline nasal wash and recommendations for follow-up with primary care physician and/or ENT.  CXR without evidence of pneumonia, pneumothorax or pulmonary edema.  Patient is afebrile and nontoxic her cardiac. No evidence of frontal sinusitis or sepsis.  BP 115/68  Pulse 77  Temp(Src) 98.2 F (36.8 C) (Oral)  Resp 18  SpO2 100%  LMP 02/05/2014   Abigail Butts, PA-C 02/24/14 2671

## 2014-02-24 MED ORDER — LEVOFLOXACIN 500 MG PO TABS
500.0000 mg | ORAL_TABLET | Freq: Once | ORAL | Status: AC
  Administered 2014-02-24: 500 mg via ORAL
  Filled 2014-02-24: qty 1

## 2014-02-24 MED ORDER — KETOROLAC TROMETHAMINE 10 MG PO TABS
10.0000 mg | ORAL_TABLET | Freq: Once | ORAL | Status: AC
  Administered 2014-02-24: 10 mg via ORAL
  Filled 2014-02-24: qty 1

## 2014-02-24 MED ORDER — GUAIFENESIN ER 600 MG PO TB12: 1200.0000 mg | ORAL_TABLET | Freq: Two times a day (BID) | ORAL | Status: DC

## 2014-02-24 MED ORDER — FLUTICASONE PROPIONATE 50 MCG/ACT NA SUSP: 2.0000 | Freq: Every day | NASAL | Status: DC

## 2014-02-24 MED ORDER — LEVOFLOXACIN 500 MG PO TABS: 500.0000 mg | ORAL_TABLET | Freq: Every day | ORAL | Status: DC

## 2014-02-24 NOTE — Discharge Instructions (Signed)
1. Medications: Levaquin, Flonase, Mucinex, usual home medications 2. Treatment: rest, drink plenty of fluids,  3. Follow Up: Please followup with your primary doctor in 3 days for discussion of your diagnoses and further evaluation after today's visit; if you do not have a primary care doctor use the resource guide provided to find one; is a for followup with ear nose and throat within one week.  Sinusitis Sinusitis is redness, soreness, and inflammation of the paranasal sinuses. Paranasal sinuses are air pockets within the bones of your face (beneath the eyes, the middle of the forehead, or above the eyes). In healthy paranasal sinuses, mucus is able to drain out, and air is able to circulate through them by way of your nose. However, when your paranasal sinuses are inflamed, mucus and air can become trapped. This can allow bacteria and other germs to grow and cause infection. Sinusitis can develop quickly and last only a short time (acute) or continue over a long period (chronic). Sinusitis that lasts for more than 12 weeks is considered chronic.  CAUSES  Causes of sinusitis include:  Allergies.  Structural abnormalities, such as displacement of the cartilage that separates your nostrils (deviated septum), which can decrease the air flow through your nose and sinuses and affect sinus drainage.  Functional abnormalities, such as when the small hairs (cilia) that line your sinuses and help remove mucus do not work properly or are not present. SIGNS AND SYMPTOMS  Symptoms of acute and chronic sinusitis are the same. The primary symptoms are pain and pressure around the affected sinuses. Other symptoms include:  Upper toothache.  Earache.  Headache.  Bad breath.  Decreased sense of smell and taste.  A cough, which worsens when you are lying flat.  Fatigue.  Fever.  Thick drainage from your nose, which often is green and may contain pus (purulent).  Swelling and warmth over the  affected sinuses. DIAGNOSIS  Your health care provider will perform a physical exam. During the exam, your health care provider may:  Look in your nose for signs of abnormal growths in your nostrils (nasal polyps).  Tap over the affected sinus to check for signs of infection.  View the inside of your sinuses (endoscopy) using an imaging device that has a light attached (endoscope). If your health care provider suspects that you have chronic sinusitis, one or more of the following tests may be recommended:  Allergy tests.  Nasal culture. A sample of mucus is taken from your nose, sent to a lab, and screened for bacteria.  Nasal cytology. A sample of mucus is taken from your nose and examined by your health care provider to determine if your sinusitis is related to an allergy. TREATMENT  Most cases of acute sinusitis are related to a viral infection and will resolve on their own within 10 days. Sometimes medicines are prescribed to help relieve symptoms (pain medicine, decongestants, nasal steroid sprays, or saline sprays).  However, for sinusitis related to a bacterial infection, your health care provider will prescribe antibiotic medicines. These are medicines that will help kill the bacteria causing the infection.  Rarely, sinusitis is caused by a fungal infection. In theses cases, your health care provider will prescribe antifungal medicine. For some cases of chronic sinusitis, surgery is needed. Generally, these are cases in which sinusitis recurs more than 3 times per year, despite other treatments. HOME CARE INSTRUCTIONS   Drink plenty of water. Water helps thin the mucus so your sinuses can drain more easily.  Use a humidifier.  Inhale steam 3 to 4 times a day (for example, sit in the bathroom with the shower running).  Apply a warm, moist washcloth to your face 3 to 4 times a day, or as directed by your health care provider.  Use saline nasal sprays to help moisten and clean  your sinuses.  Take medicines only as directed by your health care provider.  If you were prescribed either an antibiotic or antifungal medicine, finish it all even if you start to feel better. SEEK IMMEDIATE MEDICAL CARE IF:  You have increasing pain or severe headaches.  You have nausea, vomiting, or drowsiness.  You have swelling around your face.  You have vision problems.  You have a stiff neck.  You have difficulty breathing. MAKE SURE YOU:   Understand these instructions.  Will watch your condition.  Will get help right away if you are not doing well or get worse. Document Released: 05/29/2005 Document Revised: 10/13/2013 Document Reviewed: 06/13/2011 [facility] Patient Information 2015 [place], [place]. This information is not intended to replace advice given to you by your health care provider. Make sure you discuss any questions you have with your health care provider.   Emergency Department Resource Guide 1) Find a Doctor and Pay Out of Pocket Although you won't have to find out who is covered by your insurance plan, it is a good idea to ask around and get recommendations. You will then need to call the office and see if the doctor you have chosen will accept you as a new patient and what types of options they offer for patients who are self-pay. Some doctors offer discounts or will set up payment plans for their patients who do not have insurance, but you will need to ask so you aren't surprised when you get to your appointment.  2) Contact Your Local Health Department Not all health departments have doctors that can see patients for sick visits, but many do, so it is worth a call to see if yours does. If you don't know where your local health department is, you can check in your phone book. The CDC also has a tool to help you locate your state's health department, and many state websites also have listings of all of their local health departments.  3) Find a [facility] If your illness is not likely to be very severe or complicated, you may want to try a walk in clinic. These are popping up all over the country in pharmacies, drugstores, and shopping centers. They're usually staffed by nurse practitioners or physician assistants that have been trained to treat common illnesses and complaints. They're usually fairly quick and inexpensive. However, if you have serious medical issues or chronic medical problems, these are probably not your best option.  No Primary Care Doctor: - Call Health Connect at  [phone] - they can help you locate a primary care doctor that  accepts your insurance, provides certain services, etc. - Physician Referral Service- [phone]  Chronic Pain Problems: Organization         Address  Phone   Notes  [facility]  [phone] Patients need to be referred by their primary care doctor.   Medication Assistance: Organization         Address  Phone   Notes  [facility] Medication [facility] [address] [place], [place] [address] [phone] --Must be a resident of [facility] -- Must have NO insurance  coverage whatsoever (no Medicaid/ Medicare, etc.) -- The pt. MUST have a primary care doctor that directs their care regularly and follows them in the community   MedAssist  [phone]   [employer]  [phone]    Agencies that provide inexpensive medical care: Organization         Address  Phone   Notes  [place]  [phone]   [facility]    [phone]   [facility] [place], [place] [address] [phone]   [place] [address] [place] [phone]   Planned Parenthood    [phone]   [facility]    [phone]   [place] and [place], [place] Phone:  [phone], Fax:  [phone] Hours  of Operation:  9 am - 6 pm, M-F.  Also accepts Medicaid/Medicare and self-pay.  [facility] for [place] [address], [address], [place] Phone: [phone], Fax: [phone]. Hours of Operation:  8:30 am - 5:30 pm, M-F.  Also accepts Medicaid and self-pay.  [facility] [address] [place] Phone: [phone]   [place], [place], [place] [phone], Ext. [phone] Mondays & Thursdays: 7-9 AM.  First 15 patients are seen on a first come, first serve basis.    [place] Providers:  Organization         Address  Phone   Notes  [facility] [address], [place] [phone] Also accepts self-pay patients.  [facility] [address], [place]  [phone]   [place], [address], [place] [phone]   [facility] [address] [place] [phone]   Lucianne Lei [address], [place]   [phone] Only accepts [place] Access [place] patients after they have their name applied to their card.   Self-Pay (no insurance) in [facility]:  Organization         Address  Phone   Notes  Sickle Cell Patients, [facility] [place] [phone]   [place] [facility] Urgent Care [place] [phone]   [facility] [place]  [place], [place], [place] [phone]   [facility]/Dr. Osei-Bonsu  [address] [place] or [address], [place] [phone] Phone number for both [place] and [place] locations is the same.  Urgent Medical and [facility] [address] [place] [phone]   [facility] [address], [place] or [address] [phone]   [facility] [address] [place] [phone], phone; [phone], fax Sees patients 1st and 3rd Saturday of every month.  Must not qualify for public or private insurance (i.e. Medicaid, Medicare, [place] Health Choice, Veterans' Benefits) . Household income should be no more than 200% of the poverty level .The clinic cannot treat you if you are pregnant or think you are pregnant . Sexually transmitted diseases are not treated at the clinic.    Dental Care: Organization         Address  Phone  Notes  [facility] Department  of [facility] [address] [place] [phone] Accepts children up to age 26 who are enrolled in [place] or [place]; pregnant women with a Medicaid card; and children who have applied for Medicaid or [place] Health Choice, but were declined, whose parents can pay a reduced fee at time of service.  [facility] Department of [facility]  [address], [place] [phone] Accepts children up to age 12 who are enrolled in [place] or [place]; pregnant women with a Medicaid card; and children who have applied for Medicaid or [place] Health Choice, but were declined, whose parents can pay a reduced fee at time of service.  [facility] Adult Dental Access PROGRAM  [place] [phone] Patients are seen by appointment only. Walk-ins are not accepted. [place] will see patients 6 years of age and older. Monday - Tuesday (8am-5pm) Most Wednesdays (8:30-5pm) $30 per visit, cash only  [facility] Adult Dental Access PROGRAM  [address], [facility] [phone] Patients are seen by appointment only. Walk-ins are not accepted. [place] will see patients 15 years of age and older. One Wednesday Evening (Monthly: Volunteer Based).  $30 per visit, cash only  [place]  [phone] for adults; Children under age 83, call Graduate  Pediatric Dentistry at [phone]. Children aged 32-14, please call [phone] to request a pediatric application.  Dental services are provided in all areas of dental care including fillings, crowns and bridges, complete and partial dentures, implants, gum treatment, root canals, and extractions. Preventive care is also provided. Treatment is provided to both adults and children. Patients are selected via a lottery and there is often a waiting list.   [facility] [place] [address] [place]  [phone] [email or web address]   [facility] [address] [place], [place] [phone], Ext. [phone] Second and Fourth Thursday of each month, opens at 6:30 AM; Clinic ends at 9 AM.  Patients are seen on a first-come first-served basis, and a limited number are seen during each clinic.   [facility]  [address] Hillard Danker [place], [place] [phone]   Eligibility Requirements You must have lived in [place], [place], or [place] counties for at least the last three months.   You cannot be eligible for state or federal sponsored [employer], including [employer], [place], or [employer].   You generally cannot be eligible for healthcare insurance through your employer.    How to apply: Eligibility screenings are held every Tuesday and Wednesday afternoon from 1:00 pm until 4:00 pm. You do not need an appointment for the interview!  [facility] [address], [place], [place]   [place]  [place] Department  [place]  [phone]    Behavioral Health Resources in the Community: Intensive Outpatient Programs Organization         Address  Phone  Notes  [place] [employer] [address] [place], [place] [phone]   [facility] Outpatient [address] [place], [place]   ADS: Alcohol & Drug Svcs [address] [place], [place]   [place] [address],  [place], [place] or [phone]   Substance Abuse Resources Organization  Address  Phone  Notes  Alcohol and Drug Services  [phone]   [place]  [phone]   [facility] [place]  [phone]   [place]a Pester  [phone]   Residential & Outpatient Substance Abuse Program  [phone]   Psychological Services Organization         Address  Phone  Notes  [facility] [place]  [place]  [phone]   [place] [address], [place] or [phone]    Mobile Crisis Teams Organization         Address  Phone  Notes  [facility]rapeutic Alternatives, Mobile Crisis Care Unit  [phone]   Assertive Psychotherapeutic Services  [address] [place], [place]   [place] [address] [place] [phone]    Self-Help/Support Groups Organization         Address  Phone             Notes  [place]. of [place] - variety of support groups  [facility] Call for more information  Narcotics Anonymous (NA), Caring Services [address], [employer] [place]  2 meetings at this location   Special educational needs teacher         Address  Phone  Notes  ASAP Residential Treatment [place],    [place]  [phone]   [facility]  [address] [place] [address], [place], [place]   [place], [place] [phone] Admissions: 8am-3pm M-F  Incentives Substance [place] [address]    [place], [place] [phone]   [facility] [address] [place], [place], [place]   [facility] [address]  [place], [place]   Insight Programs - Intensive Outpatient [address] Krist[address], [place], [place]   [facility] ([place].) [place].,  [place], [place] [phone] or [phone]   [facility]idential Treatment Services (RTS) [address] [place], [place] Accepts Medicaid  [facility] [place] [address],  [place] [phone] Substance Abuse/Addiction Treatment   [facility] Organization         Address  Phone  Notes  [facility]  [phone]   Domenic Schwab, PhD [address] Arlis Porta [place], [place]   [phone] or [phone]   [place], [place] [phone]   [facility] [address], [place], [place] [phone] Insurance/Medicaid/sponsorship through [facility] [place] and Families [address], [address]                                    [place], [place] [phone] [place] [address], [place] [phone]    Dr. Adele Schilder  [phone]   [facility] Dept. 1) [address] [place] 2) [place] 3)  [place] [address], [place] [phone]  [phone]   [place] [phone] or [phone] (After Hours)

## 2014-02-25 NOTE — ED Provider Notes (Signed)
Medical screening examination/treatment/procedure(s) were performed by non-physician practitioner and as supervising physician I was immediately available for consultation/collaboration.   EKG Interpretation None       Varney Biles, MD 02/25/14 2257

## 2014-04-13 ENCOUNTER — Encounter (HOSPITAL_COMMUNITY): Payer: Self-pay | Admitting: Emergency Medicine

## 2014-06-01 ENCOUNTER — Inpatient Hospital Stay (HOSPITAL_COMMUNITY)
Admission: AD | Admit: 2014-06-01 | Discharge: 2014-06-01 | Disposition: A | Discharge status: Home / Self Care | Payer: PRIVATE HEALTH INSURANCE | Source: Ambulatory Visit | Attending: Obstetrics & Gynecology | Admitting: Obstetrics & Gynecology

## 2014-06-01 ENCOUNTER — Encounter (HOSPITAL_COMMUNITY): Payer: Self-pay | Admitting: *Deleted

## 2014-06-01 DIAGNOSIS — N72 Inflammatory disease of cervix uteri: Secondary | ICD-10-CM | POA: Diagnosis not present

## 2014-06-01 DIAGNOSIS — B9689 Other specified bacterial agents as the cause of diseases classified elsewhere: Secondary | ICD-10-CM | POA: Insufficient documentation

## 2014-06-01 DIAGNOSIS — A499 Bacterial infection, unspecified: Secondary | ICD-10-CM

## 2014-06-01 DIAGNOSIS — M545 Low back pain: Secondary | ICD-10-CM | POA: Diagnosis present

## 2014-06-01 DIAGNOSIS — N760 Acute vaginitis: Secondary | ICD-10-CM | POA: Diagnosis not present

## 2014-06-01 DIAGNOSIS — R109 Unspecified abdominal pain: Secondary | ICD-10-CM | POA: Diagnosis present

## 2014-06-01 LAB — URINALYSIS, ROUTINE W REFLEX MICROSCOPIC
Bilirubin Urine: NEGATIVE
Glucose, UA: NEGATIVE mg/dL
Ketones, ur: NEGATIVE mg/dL
Nitrite: NEGATIVE
Protein, ur: NEGATIVE mg/dL
Specific Gravity, Urine: 1.015 (ref 1.005–1.030)
Urobilinogen, UA: 0.2 mg/dL (ref 0.0–1.0)
pH: 6.5 (ref 5.0–8.0)

## 2014-06-01 LAB — URINE MICROSCOPIC-ADD ON

## 2014-06-01 LAB — WET PREP, GENITAL
Trich, Wet Prep: NONE SEEN
Yeast Wet Prep HPF POC: NONE SEEN

## 2014-06-01 LAB — POCT PREGNANCY, URINE: Preg Test, Ur: NEGATIVE

## 2014-06-01 MED ORDER — CEFTRIAXONE SODIUM 1 G IJ SOLR: 1.0000 g | Freq: Once | INTRAMUSCULAR | Status: DC

## 2014-06-01 MED ORDER — METRONIDAZOLE 500 MG PO TABS: 500.0000 mg | ORAL_TABLET | Freq: Two times a day (BID) | ORAL | Status: DC

## 2014-06-01 MED ORDER — CEFTRIAXONE SODIUM 250 MG IJ SOLR
250.0000 mg | Freq: Once | INTRAMUSCULAR | Status: AC
  Administered 2014-06-01: 250 mg via INTRAMUSCULAR
  Filled 2014-06-01: qty 250

## 2014-06-01 MED ORDER — IBUPROFEN 600 MG PO TABS
600.0000 mg | ORAL_TABLET | Freq: Once | ORAL | Status: AC
  Administered 2014-06-01: 600 mg via ORAL
  Filled 2014-06-01: qty 1

## 2014-06-01 MED ORDER — AZITHROMYCIN 250 MG PO TABS
1000.0000 mg | ORAL_TABLET | Freq: Once | ORAL | Status: AC
  Administered 2014-06-01: 1000 mg via ORAL
  Filled 2014-06-01: qty 4

## 2014-06-01 NOTE — MAU Note (Signed)
RLQ & R back pain for the last 2-3 days,  Pt stopped drinking sodas, drinking only water & cranberry juice.  Used a different soap 2 days ago, now having vaginal irritation.

## 2014-06-01 NOTE — MAU Provider Note (Signed)
History     CSN: [id number]  Arrival date and time: 06/01/14 1031   First Provider Initiated Contact with Patient 06/01/14 1159      Chief Complaint  Patient presents with  . Abdominal Pain  . Back Pain  . vaginal irritation    Abdominal Pain Pertinent negatives include no dysuria, fever, frequency, nausea or vomiting.  Back Pain Associated symptoms include abdominal pain. Pertinent negatives include no dysuria or fever.     [name] is a 33 y.o.female [id number] who presents with abdominal pain and back pain. The pain started 2-3 days ago; the pain is worse on the right side and radiates to her right lower back.  She has not taken anything for pain; she ate eggs and bacon for breakfast.  She also complains of vaginal irritation and vaginal itching.   OB History    Gravida Para Term Preterm AB TAB SAB Ectopic Multiple Living   2 2 2  0 0 0 0 0 0 2      Past Medical History  Diagnosis Date  . No pertinent past medical history     Past Surgical History  Procedure Laterality Date  . Tubal ligation      History reviewed. No pertinent family history.  History  Substance Use Topics  . Smoking status: Never Smoker   . Smokeless tobacco: Not on file  . Alcohol Use: No    Allergies: No Known Allergies  Prescriptions prior to admission  Medication Sig Dispense Refill Last Dose  . pseudoephedrine-acetaminophen (TYLENOL SINUS) 30-500 MG TABS Take 2 tablets by mouth every 4 (four) hours as needed (allergies and congestion).   05/31/2014 at Unknown time  . fluticasone (FLONASE) 50 MCG/ACT nasal spray Place 2 sprays into both nostrils daily. (Patient not taking: Reported on 06/01/2014) 9.9 g 2   . guaiFENesin (MUCINEX) 600 MG 12 hr tablet Take 2 tablets (1,200 mg total) by mouth 2 (two) times daily. (Patient not taking: Reported on 06/01/2014) 20 tablet 0   . levofloxacin (LEVAQUIN) 500 MG tablet Take 1 tablet (500 mg total) by mouth daily. (Patient not taking:  Reported on 06/01/2014) 10 tablet 0 Completed Course at Unknown time   Results for orders placed or performed during the hospital encounter of 06/01/14 (from the past 48 hour(s))  Urinalysis, Routine w reflex microscopic     Status: Abnormal   Collection Time: 06/01/14 10:50 AM  Result Value Ref Range   Color, Urine YELLOW YELLOW   APPearance CLEAR CLEAR   Specific Gravity, Urine 1.015 1.005 - 1.030   pH 6.5 5.0 - 8.0   Glucose, UA NEGATIVE NEGATIVE mg/dL   Hgb urine dipstick SMALL (A) NEGATIVE   Bilirubin Urine NEGATIVE NEGATIVE   Ketones, ur NEGATIVE NEGATIVE mg/dL   Protein, ur NEGATIVE NEGATIVE mg/dL   Urobilinogen, UA 0.2 0.0 - 1.0 mg/dL   Nitrite NEGATIVE NEGATIVE   Leukocytes, UA SMALL (A) NEGATIVE  Urine microscopic-add on     Status: None   Collection Time: 06/01/14 10:50 AM  Result Value Ref Range   Squamous Epithelial / LPF RARE RARE   WBC, UA 0-2 <3 WBC/hpf  Pregnancy, urine POC     Status: None   Collection Time: 06/01/14 10:57 AM  Result Value Ref Range   Preg Test, Ur NEGATIVE NEGATIVE    Comment:        THE SENSITIVITY OF THIS METHODOLOGY IS >24 mIU/mL    Results for orders placed or performed during the hospital encounter  of 06/01/14 (from the past 48 hour(s))  Urinalysis, Routine w reflex microscopic     Status: Abnormal   Collection Time: 06/01/14 10:50 AM  Result Value Ref Range   Color, Urine YELLOW YELLOW   APPearance CLEAR CLEAR   Specific Gravity, Urine 1.015 1.005 - 1.030   pH 6.5 5.0 - 8.0   Glucose, UA NEGATIVE NEGATIVE mg/dL   Hgb urine dipstick SMALL (A) NEGATIVE   Bilirubin Urine NEGATIVE NEGATIVE   Ketones, ur NEGATIVE NEGATIVE mg/dL   Protein, ur NEGATIVE NEGATIVE mg/dL   Urobilinogen, UA 0.2 0.0 - 1.0 mg/dL   Nitrite NEGATIVE NEGATIVE   Leukocytes, UA SMALL (A) NEGATIVE  Urine microscopic-add on     Status: None   Collection Time: 06/01/14 10:50 AM  Result Value Ref Range   Squamous Epithelial / LPF RARE RARE   WBC, UA 0-2 <3  WBC/hpf  Pregnancy, urine POC     Status: None   Collection Time: 06/01/14 10:57 AM  Result Value Ref Range   Preg Test, Ur NEGATIVE NEGATIVE    Comment:        THE SENSITIVITY OF THIS METHODOLOGY IS >24 mIU/mL   Wet prep, genital     Status: Abnormal   Collection Time: 06/01/14 12:05 PM  Result Value Ref Range   Yeast Wet Prep HPF POC NONE SEEN NONE SEEN   Trich, Wet Prep NONE SEEN NONE SEEN   Clue Cells Wet Prep HPF POC FEW (A) NONE SEEN   WBC, Wet Prep HPF POC FEW (A) NONE SEEN    Comment: FEW BACTERIA SEEN    Review of Systems  Constitutional: Negative for fever and chills.  Gastrointestinal: Positive for abdominal pain. Negative for nausea and vomiting.  Genitourinary: Negative for dysuria, urgency and frequency.       + vaginal irritation.  + vaginal itching   Musculoskeletal: Positive for back pain.   Physical Exam   Blood pressure 125/73, pulse 87, temperature 98.6 F (37 C), temperature source Oral, resp. rate 18, height 5\' 5"  (1.651 m), weight 72.757 kg (160 lb 6.4 oz), last menstrual period 05/19/2014.  Physical Exam  Constitutional: She is oriented to person, place, and time. She appears well-developed and well-nourished.  HENT:  Head: Normocephalic.  Eyes: Pupils are equal, round, and reactive to light.  Neck: Neck supple.  Respiratory: Effort normal.  GI: Soft. Normal appearance. She exhibits no distension. There is generalized tenderness.  Genitourinary: Vaginal discharge found.  Speculum exam: Vagina - copious amount of pale yellow discharge, mild odor  Cervix - + contact bleeding, + friability  Bimanual exam: Cervix closed, + mild CMT  Uterus non tender, normal size Adnexa non tender, no masses bilaterally GC/Chlam, wet prep done Chaperone present for exam.   Musculoskeletal: Normal range of motion.  Neurological: She is alert and oriented to person, place, and time.  Skin: Skin is warm.  Psychiatric: Her behavior is normal.    MAU Course   Procedures  None  MDM Discussed treating the patient for cervicitis. The patient has had Trichomonas twice in the last year. Given her history the patient agreed to the treatment.  Azithromycin 1 gram Rocephin 250 mg IM   Assessment and Plan   A:  1. Cervicitis   2. Bacterial vaginosis    P:  Discharge home in stable condition  Partner needs to be evaluated RX: Flagyl  Condoms always GC- pending   Ronnald Ramp, NP 06/01/2014 4:51 PM

## 2014-06-01 NOTE — Discharge Instructions (Signed)
Bacterial Vaginosis Bacterial vaginosis is a vaginal infection that occurs when the normal balance of bacteria in the vagina is disrupted. It results from an overgrowth of certain bacteria. This is the most common vaginal infection in women of childbearing age. Treatment is important to prevent complications, especially in pregnant women, as it can cause a premature delivery. CAUSES  Bacterial vaginosis is caused by an increase in harmful bacteria that are normally present in smaller amounts in the vagina. Several different kinds of bacteria can cause bacterial vaginosis. However, the reason that the condition develops is not fully understood. RISK FACTORS Certain activities or behaviors can put you at an increased risk of developing bacterial vaginosis, including:  Having a new sex partner or multiple sex partners.  Douching.  Using an intrauterine device (IUD) for contraception. Women do not get bacterial vaginosis from toilet seats, bedding, swimming pools, or contact with objects around them. SIGNS AND SYMPTOMS  Some women with bacterial vaginosis have no signs or symptoms. Common symptoms include:  Grey vaginal discharge.  A fishlike odor with discharge, especially after sexual intercourse.  Itching or burning of the vagina and vulva.  Burning or pain with urination. DIAGNOSIS  Your health care provider will take a medical history and examine the vagina for signs of bacterial vaginosis. A sample of vaginal fluid may be taken. Your health care provider will look at this sample under a microscope to check for bacteria and abnormal cells. A vaginal pH test may also be done.  TREATMENT  Bacterial vaginosis may be treated with antibiotic medicines. These may be given in the form of a pill or a vaginal cream. A second round of antibiotics may be prescribed if the condition comes back after treatment.  HOME CARE INSTRUCTIONS   Only take over-the-counter or prescription medicines as  directed by your health care provider.  If antibiotic medicine was prescribed, take it as directed. Make sure you finish it even if you start to feel better.  Do not have sex until treatment is completed.  Tell all sexual partners that you have a vaginal infection. They should see their health care provider and be treated if they have problems, such as a mild rash or itching.  Practice safe sex by using condoms and only having one sex partner. SEEK MEDICAL CARE IF:   Your symptoms are not improving after 3 days of treatment.  You have increased discharge or pain.  You have a fever. MAKE SURE YOU:   Understand these instructions.  Will watch your condition.  Will get help right away if you are not doing well or get worse. FOR MORE INFORMATION  Centers for Disease Control and Prevention, Division of STD Prevention: www.cdc.gov/std American Sexual Health Association (ASHA): www.ashastd.org  Document Released: 05/29/2005 Document Revised: 03/19/2013 Document Reviewed: 01/08/2013 ExitCare Patient Information 2015 [employer]. This information is not intended to replace advice given to you by your health care provider. Make sure you discuss any questions you have with your health care provider.  

## 2014-06-02 LAB — HIV ANTIBODY (ROUTINE TESTING W REFLEX): HIV 1&2 Ab, 4th Generation: NONREACTIVE

## 2014-06-02 LAB — GC/CHLAMYDIA PROBE AMP
CT Probe RNA: NEGATIVE
GC Probe RNA: NEGATIVE

## 2014-11-06 ENCOUNTER — Emergency Department (HOSPITAL_COMMUNITY): Payer: PRIVATE HEALTH INSURANCE

## 2014-11-06 ENCOUNTER — Encounter (HOSPITAL_COMMUNITY): Payer: Self-pay | Admitting: Emergency Medicine

## 2014-11-06 ENCOUNTER — Emergency Department (HOSPITAL_COMMUNITY)
Admission: EM | Admit: 2014-11-06 | Discharge: 2014-11-06 | Disposition: A | Discharge status: Home / Self Care | Payer: PRIVATE HEALTH INSURANCE | Attending: Emergency Medicine | Admitting: Emergency Medicine

## 2014-11-06 DIAGNOSIS — Z3202 Encounter for pregnancy test, result negative: Secondary | ICD-10-CM | POA: Insufficient documentation

## 2014-11-06 DIAGNOSIS — Z792 Long term (current) use of antibiotics: Secondary | ICD-10-CM | POA: Diagnosis not present

## 2014-11-06 DIAGNOSIS — R079 Chest pain, unspecified: Secondary | ICD-10-CM | POA: Diagnosis present

## 2014-11-06 DIAGNOSIS — N390 Urinary tract infection, site not specified: Secondary | ICD-10-CM | POA: Insufficient documentation

## 2014-11-06 DIAGNOSIS — J069 Acute upper respiratory infection, unspecified: Secondary | ICD-10-CM | POA: Diagnosis not present

## 2014-11-06 LAB — URINALYSIS, ROUTINE W REFLEX MICROSCOPIC
Bilirubin Urine: NEGATIVE
Glucose, UA: NEGATIVE mg/dL
Hgb urine dipstick: NEGATIVE
Ketones, ur: NEGATIVE mg/dL
Nitrite: NEGATIVE
Protein, ur: NEGATIVE mg/dL
Specific Gravity, Urine: 1.016 (ref 1.005–1.030)
Urobilinogen, UA: 1 mg/dL (ref 0.0–1.0)
pH: 7.5 (ref 5.0–8.0)

## 2014-11-06 LAB — I-STAT TROPONIN, ED: Troponin i, poc: 0 ng/mL (ref 0.00–0.08)

## 2014-11-06 LAB — URINE MICROSCOPIC-ADD ON

## 2014-11-06 LAB — BASIC METABOLIC PANEL
Anion gap: 8 (ref 5–15)
BUN: 8 mg/dL (ref 6–20)
CO2: 23 mmol/L (ref 22–32)
Calcium: 8.7 mg/dL — ABNORMAL LOW (ref 8.9–10.3)
Chloride: 107 mmol/L (ref 101–111)
Creatinine, Ser: 0.8 mg/dL (ref 0.44–1.00)
GFR calc Af Amer: >60 mL/min (ref >60)
GFR calc non Af Amer: >60 mL/min (ref >60)
Glucose, Bld: 85 mg/dL (ref 65–99)
Potassium: 3.4 mmol/L — ABNORMAL LOW (ref 3.5–5.1)
Sodium: 138 mmol/L (ref 135–145)

## 2014-11-06 LAB — CBC
HCT: 40.3 % (ref 36.0–46.0)
Hemoglobin: 13.5 g/dL (ref 12.0–15.0)
MCH: 29.5 pg (ref 26.0–34.0)
MCHC: 33.5 g/dL (ref 30.0–36.0)
MCV: 88 fL (ref 78.0–100.0)
Platelets: 218 10*3/uL (ref 150–400)
RBC: 4.58 MIL/uL (ref 3.87–5.11)
RDW: 12.7 % (ref 11.5–15.5)
WBC: 8 10*3/uL (ref 4.0–10.5)

## 2014-11-06 LAB — POC URINE PREG, ED: Preg Test, Ur: NEGATIVE

## 2014-11-06 MED ORDER — SULFAMETHOXAZOLE-TRIMETHOPRIM 800-160 MG PO TABS: 1.0000 | ORAL_TABLET | Freq: Two times a day (BID) | ORAL | Status: AC

## 2014-11-06 MED ORDER — GUAIFENESIN 100 MG/5ML PO LIQD: 100.0000 mg | ORAL | Status: DC | PRN

## 2014-11-06 NOTE — ED Provider Notes (Signed)
CSN: [id number]     Arrival date & time 11/06/14  2006 History   First MD Initiated Contact with Patient 11/06/14 2028     Chief Complaint  Patient presents with  . Chest Pain     (Consider location/radiation/quality/duration/timing/severity/associated sxs/prior Treatment) HPI   34 year old female with no significant past medical history who presents for evaluation of flulike symptoms. Patient reports she has had a sinus issue for the past several days along with allergies symptoms. Since yesterday she has been having body aches, pleuritic chest pain, shortness of breath, sore throat, sneezing, nonproductive cough, posttussive emesis. She tries over-the-counter medication including Tylenol Sinus with minimal improvement. Furthermore she also complaining of increased urinary frequency and pain to her right flank centralization. Patient states in the past she had flulike symptoms but subsequently diagnosed with having a kidney infection therefore she was concerned about her symptoms. She also mentioned she traveled to [place] last week. Reports possible sick contacts because she works as a Quarry manager and also work in [employer].   Past Medical History  Diagnosis Date  . No pertinent past medical history    Past Surgical History  Procedure Laterality Date  . Tubal ligation     History reviewed. No pertinent family history. History  Substance Use Topics  . Smoking status: Never Smoker   . Smokeless tobacco: Not on file  . Alcohol Use: No   OB History    Gravida Para Term Preterm AB TAB SAB Ectopic Multiple Living   2 2 2  0 0 0 0 0 0 2     Review of Systems  All other systems reviewed and are negative.     Allergies  Review of patient's allergies indicates no known allergies.  Home Medications   Prior to Admission medications   Medication Sig Start Date End Date Taking? Authorizing Provider  metroNIDAZOLE (FLAGYL) 500 MG tablet Take 1 tablet (500 mg total) by mouth 2 (two) times  daily. 06/01/14   Lezlie Lye, NP  pseudoephedrine-acetaminophen (TYLENOL SINUS) 30-500 MG TABS Take 2 tablets by mouth every 4 (four) hours as needed (allergies and congestion).    Historical Provider, MD   BP 124/81 mmHg  Pulse 85  Temp(Src) 98.2 F (36.8 C) (Oral)  Resp 22  Ht 5\' 5"  (1.651 m)  Wt 168 lb 6.4 oz (76.386 kg)  BMI 28.02 kg/m2  SpO2 100%  LMP 10/12/2014 (Exact Date) Physical Exam  Constitutional: She appears well-developed and well-nourished. No distress.  HENT:  Head: Atraumatic.  Right Ear: External ear normal.  Left Ear: External ear normal.  Nose: Nose normal.  Mouth/Throat: Oropharynx is clear and moist.  Eyes: Conjunctivae are normal.  Neck: Neck supple.  Cardiovascular: Normal rate, regular rhythm and intact distal pulses.   Pulmonary/Chest: Effort normal and breath sounds normal.  Abdominal: Soft. Bowel sounds are normal. There is no tenderness.  Genitourinary:  Mild right flank pain, no CVA tenderness.  Musculoskeletal: She exhibits no edema.  Lymphadenopathy:    She has no cervical adenopathy.  Neurological: She is alert.  Skin: No rash noted.  Psychiatric: She has a normal mood and affect.  Nursing note and vitals reviewed.   ED Course  Procedures (including critical care time)  Patient here with flulike symptoms. Chest x-ray shows no signs of pneumonia. Symptoms is likely to be cardiac in etiology. Her EKG and oriented is unremarkable.  She also mentioned that she had a kidney infection the last time she has similar symptoms. She does endorse  urinary frequency and having right flank pain. Will check UA.  10:58 PM Chest x-ray shows no acute infiltrates concerning for pneumonia. Her urine however this with evidence to suggest UTI. Urine culture sent. Patient will be treated with antibiotic. She also requesting a work note and medication to help with her congestion.  Labs Review Labs Reviewed  BASIC METABOLIC PANEL - Abnormal; Notable for  the following:    Potassium 3.4 (*)    Calcium 8.7 (*)    All other components within normal limits  URINALYSIS, ROUTINE W REFLEX MICROSCOPIC (NOT AT [facility]) - Abnormal; Notable for the following:    APPearance CLOUDY (*)    Leukocytes, UA TRACE (*)    All other components within normal limits  URINE MICROSCOPIC-ADD ON - Abnormal; Notable for the following:    Squamous Epithelial / LPF FEW (*)    All other components within normal limits  URINE CULTURE  CBC  I-STAT TROPOININ, ED  POC URINE PREG, ED    Imaging Review Dg Chest 2 View  11/06/2014   CLINICAL DATA:  Nonradiating midline chest pain with shortness of breath, coughing and congestion beginning today.  EXAM: CHEST  2 VIEW  COMPARISON:  02/24/2014  FINDINGS: Heart, mediastinum and hila are unremarkable.  Clear lungs.  No pleural effusion or pneumothorax.  Bony thorax is unremarkable.  IMPRESSION: Chest radiographs.   Electronically Signed   By: Lajean Manes M.D.   On: 11/06/2014 20:57     EKG Interpretation None      Date: 11/06/2014  Rate: 83  Rhythm: normal sinus rhythm  QRS Axis: normal  Intervals: normal  ST/T Wave abnormalities: normal  Conduction Disutrbances: none  Narrative Interpretation:   Old EKG Reviewed: No significant changes noted EKG interpreted by me     MDM   Final diagnoses:  URI (upper respiratory infection)  UTI (lower urinary tract infection)    BP 107/74 mmHg  Pulse 76  Temp(Src) 98.2 F (36.8 C) (Oral)  Resp 16  Ht 5\' 5"  (1.651 m)  Wt 168 lb 6.4 oz (76.386 kg)  BMI 28.02 kg/m2  SpO2 100%  LMP 10/12/2014 (Exact Date)  I have reviewed nursing notes and vital signs. I personally reviewed the imaging tests through PACS system  I reviewed available ER/hospitalization records thought the EMR     Domenic Moras, PA-C 11/06/14 [place], MD 11/06/14 2350

## 2014-11-06 NOTE — ED Notes (Signed)
Patient transported to X-ray 

## 2014-11-06 NOTE — Discharge Instructions (Signed)

## 2014-11-06 NOTE — ED Notes (Signed)
PA made aware that pt. Is requesting to see him again for pain med. PA notified.

## 2014-11-06 NOTE — ED Notes (Signed)
Pt arrived to the ED with a complaint of chest pain.  Pt states she has had symptoms starting today.  Pt states pain is middle chest and hurts when she coughs.  Pt also states she is having shortness of breath.  Pt states he has had sinus issues for a couple of days.  Pt is also complaining of back pain.

## 2014-11-08 LAB — URINE CULTURE
Colony Count: NO GROWTH
Culture: NO GROWTH

## 2014-12-24 ENCOUNTER — Encounter (HOSPITAL_COMMUNITY): Payer: Self-pay | Admitting: *Deleted

## 2014-12-24 ENCOUNTER — Inpatient Hospital Stay (HOSPITAL_COMMUNITY)
Admission: AD | Admit: 2014-12-24 | Discharge: 2014-12-24 | Disposition: A | Discharge status: Home / Self Care | Payer: PRIVATE HEALTH INSURANCE | Source: Ambulatory Visit | Attending: Obstetrics & Gynecology | Admitting: Obstetrics & Gynecology

## 2014-12-24 DIAGNOSIS — A499 Bacterial infection, unspecified: Secondary | ICD-10-CM

## 2014-12-24 DIAGNOSIS — N760 Acute vaginitis: Secondary | ICD-10-CM | POA: Insufficient documentation

## 2014-12-24 DIAGNOSIS — N898 Other specified noninflammatory disorders of vagina: Secondary | ICD-10-CM | POA: Diagnosis present

## 2014-12-24 DIAGNOSIS — Z9851 Tubal ligation status: Secondary | ICD-10-CM | POA: Insufficient documentation

## 2014-12-24 DIAGNOSIS — B9689 Other specified bacterial agents as the cause of diseases classified elsewhere: Secondary | ICD-10-CM

## 2014-12-24 HISTORY — DX: Contact with and (suspected) exposure to infections with a predominantly sexual mode of transmission: Z20.2

## 2014-12-24 LAB — URINALYSIS, ROUTINE W REFLEX MICROSCOPIC
Bilirubin Urine: NEGATIVE
Glucose, UA: NEGATIVE mg/dL
Ketones, ur: NEGATIVE mg/dL
Leukocytes, UA: NEGATIVE
Nitrite: NEGATIVE
Protein, ur: NEGATIVE mg/dL
Specific Gravity, Urine: >1.03 — ABNORMAL HIGH (ref 1.005–1.030)
Urobilinogen, UA: 0.2 mg/dL (ref 0.0–1.0)
pH: 6 (ref 5.0–8.0)

## 2014-12-24 LAB — WET PREP, GENITAL
Trich, Wet Prep: NONE SEEN
Yeast Wet Prep HPF POC: NONE SEEN

## 2014-12-24 LAB — URINE MICROSCOPIC-ADD ON

## 2014-12-24 LAB — POCT PREGNANCY, URINE: Preg Test, Ur: NEGATIVE

## 2014-12-24 MED ORDER — METRONIDAZOLE 500 MG PO TABS: 500.0000 mg | ORAL_TABLET | Freq: Two times a day (BID) | ORAL | Status: DC

## 2014-12-24 NOTE — Discharge Instructions (Signed)
Bacterial Vaginosis Bacterial vaginosis is a vaginal infection that occurs when the normal balance of bacteria in the vagina is disrupted. It results from an overgrowth of certain bacteria. This is the most common vaginal infection in women of childbearing age. Treatment is important to prevent complications, especially in pregnant women, as it can cause a premature delivery. CAUSES  Bacterial vaginosis is caused by an increase in harmful bacteria that are normally present in smaller amounts in the vagina. Several different kinds of bacteria can cause bacterial vaginosis. However, the reason that the condition develops is not fully understood. RISK FACTORS Certain activities or behaviors can put you at an increased risk of developing bacterial vaginosis, including:  Having a new sex partner or multiple sex partners.  Douching.  Using an intrauterine device (IUD) for contraception. Women do not get bacterial vaginosis from toilet seats, bedding, swimming pools, or contact with objects around them. SIGNS AND SYMPTOMS  Some women with bacterial vaginosis have no signs or symptoms. Common symptoms include:  Grey vaginal discharge.  A fishlike odor with discharge, especially after sexual intercourse.  Itching or burning of the vagina and vulva.  Burning or pain with urination. DIAGNOSIS  Your health care provider will take a medical history and examine the vagina for signs of bacterial vaginosis. A sample of vaginal fluid may be taken. Your health care provider will look at this sample under a microscope to check for bacteria and abnormal cells. A vaginal pH test may also be done.  TREATMENT  Bacterial vaginosis may be treated with antibiotic medicines. These may be given in the form of a pill or a vaginal cream. A second round of antibiotics may be prescribed if the condition comes back after treatment.  HOME CARE INSTRUCTIONS   Only take over-the-counter or prescription medicines as  directed by your health care provider.  If antibiotic medicine was prescribed, take it as directed. Make sure you finish it even if you start to feel better.  Do not have sex until treatment is completed.  Tell all sexual partners that you have a vaginal infection. They should see their health care provider and be treated if they have problems, such as a mild rash or itching.  Practice safe sex by using condoms and only having one sex partner. SEEK MEDICAL CARE IF:   Your symptoms are not improving after 3 days of treatment.  You have increased discharge or pain.  You have a fever. MAKE SURE YOU:   Understand these instructions.  Will watch your condition.  Will get help right away if you are not doing well or get worse. FOR MORE INFORMATION  Centers for Disease Control and Prevention, Division of STD Prevention: www.cdc.gov/std American Sexual Health Association (ASHA): www.ashastd.org  Document Released: 05/29/2005 Document Revised: 03/19/2013 Document Reviewed: 01/08/2013 ExitCare Patient Information 2015 [employer]. This information is not intended to replace advice given to you by your health care provider. Make sure you discuss any questions you have with your health care provider.  

## 2014-12-24 NOTE — MAU Provider Note (Signed)
History     CSN: [id number]  Arrival date and time: 12/24/14 1035   None     Chief Complaint  Patient presents with  . Vaginal Discharge   HPI   Laurie Bell is a 34 y.o. G32P2002 female who presents to the MAU with a complaint of vaginal discharge. She states that the discharge started about 2 days ago and she describes it as a yellow, odorous discharge. She denies any vaginal burning or itching with the discharge. She also denies any dysuria. She endorses mild bleeding during and after sexual intercourse, which she first noticed two weeks ago. She also endorses vaginal dryness starting about 2 weeks ago. She is currently sexually active and was monogamous with her boyfriend until 2 days ago when they broke up. She does not use protected sex regularly and she last had unprotected sex about a week and a half ago. She states that she does not know how many partners her boyfriend has had in the past. She has been treated for gonorrhea 08/24/13 and she states that was diagnosed with chlamydia several months ago, but the record is not in her chart.  She has a history of BTL    OB History    Gravida Para Term Preterm AB TAB SAB Ectopic Multiple Living   2 2 2  0 0 0 0 0 0 2      Past Medical History  Diagnosis Date  . No pertinent past medical history   . Chlamydia contact, treated     Past Surgical History  Procedure Laterality Date  . Tubal ligation      History reviewed. No pertinent family history.  History  Substance Use Topics  . Smoking status: Never Smoker   . Smokeless tobacco: Not on file  . Alcohol Use: No    Allergies: No Known Allergies  Prescriptions prior to admission  Medication Sig Dispense Refill Last Dose  . cetirizine (ZYRTEC) 10 MG tablet Take 10 mg by mouth daily as needed for allergies.   Past Week at Unknown time  . pseudoephedrine-acetaminophen (TYLENOL SINUS) 30-500 MG TABS Take 2 tablets by mouth every 4 (four) hours as needed (allergies  and congestion).   Past Month at Unknown time  . guaiFENesin (ROBITUSSIN) 100 MG/5ML liquid Take 5-10 mLs (100-200 mg total) by mouth every 4 (four) hours as needed for cough. (Patient not taking: Reported on 12/24/2014) 60 mL 0 Not Taking at Unknown time  . metroNIDAZOLE (FLAGYL) 500 MG tablet Take 1 tablet (500 mg total) by mouth 2 (two) times daily. (Patient not taking: Reported on 11/06/2014) 14 tablet 0 More than a month at Unknown time   Results for orders placed or performed during the hospital encounter of 12/24/14 (from the past 48 hour(s))  Urinalysis, Routine w reflex microscopic (not at [facility])     Status: Abnormal   Collection Time: 12/24/14 10:52 AM  Result Value Ref Range   Color, Urine YELLOW YELLOW   APPearance CLEAR CLEAR   Specific Gravity, Urine >1.030 (H) 1.005 - 1.030   pH 6.0 5.0 - 8.0   Glucose, UA NEGATIVE NEGATIVE mg/dL   Hgb urine dipstick MODERATE (A) NEGATIVE   Bilirubin Urine NEGATIVE NEGATIVE   Ketones, ur NEGATIVE NEGATIVE mg/dL   Protein, ur NEGATIVE NEGATIVE mg/dL   Urobilinogen, UA 0.2 0.0 - 1.0 mg/dL   Nitrite NEGATIVE NEGATIVE   Leukocytes, UA NEGATIVE NEGATIVE  Urine microscopic-add on     Status: Abnormal   Collection Time: 12/24/14 10:52  AM  Result Value Ref Range   Squamous Epithelial / LPF MANY (A) RARE   RBC / HPF 3-6 <3 RBC/hpf   Urine-Other MUCOUS PRESENT   Pregnancy, urine POC     Status: None   Collection Time: 12/24/14 10:59 AM  Result Value Ref Range   Preg Test, Ur NEGATIVE NEGATIVE    Comment:        THE SENSITIVITY OF THIS METHODOLOGY IS >24 mIU/mL   Wet prep, genital     Status: Abnormal   Collection Time: 12/24/14  5:30 PM  Result Value Ref Range   Yeast Wet Prep HPF POC NONE SEEN NONE SEEN   Trich, Wet Prep NONE SEEN NONE SEEN   Clue Cells Wet Prep HPF POC MODERATE (A) NONE SEEN   WBC, Wet Prep HPF POC MODERATE (A) NONE SEEN    Comment: BACTERIA- TOO NUMEROUS TO COUNT    Review of Systems  Constitutional: Negative for  fever.  Gastrointestinal: Negative for abdominal pain.  Genitourinary: Negative for dysuria.   Physical Exam   Blood pressure 116/71, pulse 88, temperature 98.3 F (36.8 C), temperature source Oral, resp. rate 18, height 5\' 5"  (1.651 m), weight 74.481 kg (164 lb 3.2 oz), last menstrual period 12/11/2014.  Physical Exam  Constitutional: She is oriented to person, place, and time. She appears well-developed and well-nourished. No distress.  Respiratory: Effort normal.  GI: Soft.  Genitourinary:  Speculum exam: Vagina - Moderate amount of yellow discharge; + strong odor.  Cervix - + contact blood from swab.  Bimanual exam: Cervix closed, no CMT  Uterus non tender, normal size Bilateral adnexal pain.  GC/Chlam, wet prep done Chaperone present for exam.  Musculoskeletal: Normal range of motion.  Neurological: She is alert and oriented to person, place, and time.  Skin: Skin is warm. She is not diaphoretic.  Psychiatric: Her behavior is normal.    MAU Course  Procedures  None  MDM  Wet prep GC HIV   Assessment and Plan   A:  1. Bacterial vaginosis    P:  Discharge home in stable condition RX: Flagyl- no alcohol  Discussed the importance of a PCP  Condoms always GC pending.    Lezlie Lye, NP 12/24/2014 7:14 PM

## 2014-12-24 NOTE — MAU Note (Signed)
Pt also reports vag and labial irritation and spotting with intercourse.

## 2014-12-24 NOTE — MAU Note (Signed)
Pt presents complaining of yellow vaginal discharge with an odor that started yesterday. Denies pain or vaginal bleeding.

## 2014-12-25 LAB — GC/CHLAMYDIA PROBE AMP (~~LOC~~) NOT AT ARMC
Chlamydia: NEGATIVE
Neisseria Gonorrhea: NEGATIVE

## 2014-12-25 LAB — HIV ANTIBODY (ROUTINE TESTING W REFLEX): HIV Screen 4th Generation wRfx: NONREACTIVE

## 2015-02-21 ENCOUNTER — Encounter (HOSPITAL_COMMUNITY): Payer: Self-pay | Admitting: Emergency Medicine

## 2015-02-21 ENCOUNTER — Emergency Department (HOSPITAL_COMMUNITY)
Admission: EM | Admit: 2015-02-21 | Discharge: 2015-02-21 | Disposition: A | Discharge status: Home / Self Care | Payer: PRIVATE HEALTH INSURANCE | Attending: Emergency Medicine | Admitting: Emergency Medicine

## 2015-02-21 DIAGNOSIS — B9689 Other specified bacterial agents as the cause of diseases classified elsewhere: Secondary | ICD-10-CM

## 2015-02-21 DIAGNOSIS — N898 Other specified noninflammatory disorders of vagina: Secondary | ICD-10-CM | POA: Diagnosis present

## 2015-02-21 DIAGNOSIS — N760 Acute vaginitis: Secondary | ICD-10-CM | POA: Insufficient documentation

## 2015-02-21 DIAGNOSIS — Z3202 Encounter for pregnancy test, result negative: Secondary | ICD-10-CM | POA: Diagnosis not present

## 2015-02-21 DIAGNOSIS — Z793 Long term (current) use of hormonal contraceptives: Secondary | ICD-10-CM | POA: Diagnosis not present

## 2015-02-21 LAB — WET PREP, GENITAL
Trich, Wet Prep: NONE SEEN
Yeast Wet Prep HPF POC: NONE SEEN

## 2015-02-21 LAB — URINALYSIS, ROUTINE W REFLEX MICROSCOPIC
Bilirubin Urine: NEGATIVE
Glucose, UA: NEGATIVE mg/dL
Ketones, ur: NEGATIVE mg/dL
Leukocytes, UA: NEGATIVE
Nitrite: NEGATIVE
Protein, ur: NEGATIVE mg/dL
Specific Gravity, Urine: 1.025 (ref 1.005–1.030)
Urobilinogen, UA: 1 mg/dL (ref 0.0–1.0)
pH: 7 (ref 5.0–8.0)

## 2015-02-21 LAB — POC URINE PREG, ED: Preg Test, Ur: NEGATIVE

## 2015-02-21 LAB — URINE MICROSCOPIC-ADD ON

## 2015-02-21 MED ORDER — METRONIDAZOLE 500 MG PO TABS: 500.0000 mg | ORAL_TABLET | Freq: Two times a day (BID) | ORAL | Status: DC

## 2015-02-21 MED ORDER — FLUCONAZOLE 100 MG PO TABS
150.0000 mg | ORAL_TABLET | Freq: Once | ORAL | Status: AC
  Administered 2015-02-21: 150 mg via ORAL
  Filled 2015-02-21: qty 2

## 2015-02-21 NOTE — ED Notes (Signed)
Vaginal discharge and odor; also left side pain. Thinks she either has UTI or BV.

## 2015-02-21 NOTE — Discharge Instructions (Signed)
Bacterial Vaginosis Bacterial vaginosis is a vaginal infection that occurs when the normal balance of bacteria in the vagina is disrupted. It results from an overgrowth of certain bacteria. This is the most common vaginal infection in women of childbearing age. Treatment is important to prevent complications, especially in pregnant women, as it can cause a premature delivery. CAUSES  Bacterial vaginosis is caused by an increase in harmful bacteria that are normally present in smaller amounts in the vagina. Several different kinds of bacteria can cause bacterial vaginosis. However, the reason that the condition develops is not fully understood. RISK FACTORS Certain activities or behaviors can put you at an increased risk of developing bacterial vaginosis, including:  Having a new sex partner or multiple sex partners.  Douching.  Using an intrauterine device (IUD) for contraception. Women do not get bacterial vaginosis from toilet seats, bedding, swimming pools, or contact with objects around them. SIGNS AND SYMPTOMS  Some women with bacterial vaginosis have no signs or symptoms. Common symptoms include:  Grey vaginal discharge.  A fishlike odor with discharge, especially after sexual intercourse.  Itching or burning of the vagina and vulva.  Burning or pain with urination. DIAGNOSIS  Your health care provider will take a medical history and examine the vagina for signs of bacterial vaginosis. A sample of vaginal fluid may be taken. Your health care provider will look at this sample under a microscope to check for bacteria and abnormal cells. A vaginal pH test may also be done.  TREATMENT  Bacterial vaginosis may be treated with antibiotic medicines. These may be given in the form of a pill or a vaginal cream. A second round of antibiotics may be prescribed if the condition comes back after treatment.  HOME CARE INSTRUCTIONS   Only take over-the-counter or prescription medicines as  directed by your health care provider.  If antibiotic medicine was prescribed, take it as directed. Make sure you finish it even if you start to feel better.  Do not have sex until treatment is completed.  Tell all sexual partners that you have a vaginal infection. They should see their health care provider and be treated if they have problems, such as a mild rash or itching.  Practice safe sex by using condoms and only having one sex partner. SEEK MEDICAL CARE IF:   Your symptoms are not improving after 3 days of treatment.  You have increased discharge or pain.  You have a fever. MAKE SURE YOU:   Understand these instructions.  Will watch your condition.  Will get help right away if you are not doing well or get worse. FOR MORE INFORMATION  Centers for Disease Control and Prevention, Division of STD Prevention: www.cdc.gov/std American Sexual Health Association (ASHA): www.ashastd.org  Document Released: 05/29/2005 Document Revised: 03/19/2013 Document Reviewed: 01/08/2013 ExitCare Patient Information 2015 [employer]. This information is not intended to replace advice given to you by your health care provider. Make sure you discuss any questions you have with your health care provider.  

## 2015-02-21 NOTE — ED Provider Notes (Signed)
CSN: [id number]     Arrival date & time 02/21/15  1821 History   First MD Initiated Contact with Patient 02/21/15 1850     Chief Complaint  Patient presents with  . Vaginal Discharge     (Consider location/radiation/quality/duration/timing/severity/associated sxs/prior Treatment) Patient is a 34 y.o. female presenting with vaginal discharge. The history is provided by the patient.  Vaginal Discharge Quality:  Watery Onset quality:  Gradual Duration:  3 days Timing:  Constant Progression:  Worsening Chronicity:  New Context: spontaneously   Relieved by:  None tried Worsened by:  Nothing tried Ineffective treatments:  None tried Risk factors: unprotected sex    Laurie Bell is G2 P2 with BTL for birth control, current sex partner x 7 years. Hx of BV and trichomonas. She complains of a watery fishy smelling d/c that started 3 days ago. She denies any other symptoms. She reports having started her menses while here in the ED.  Past Medical History  Diagnosis Date  . No pertinent past medical history   . Chlamydia contact, treated    Past Surgical History  Procedure Laterality Date  . Tubal ligation     History reviewed. No pertinent family history. Social History  Substance Use Topics  . Smoking status: Never Smoker   . Smokeless tobacco: None  . Alcohol Use: No   OB History    Gravida Para Term Preterm AB TAB SAB Ectopic Multiple Living   2 2 2  0 0 0 0 0 0 2     Review of Systems  Genitourinary: Positive for vaginal bleeding and vaginal discharge.  all other systems negative    Allergies  Review of patient's allergies indicates no known allergies.  Home Medications   Prior to Admission medications   Medication Sig Start Date End Date Taking? Authorizing Provider  cetirizine (ZYRTEC) 10 MG tablet Take 10 mg by mouth daily as needed for allergies.    Historical Provider, MD  metroNIDAZOLE (FLAGYL) 500 MG tablet Take 1 tablet (500 mg total) by mouth 2 (two)  times daily. 02/21/15   Ottilia Pippenger Bunnie Pion, NP  pseudoephedrine-acetaminophen (TYLENOL SINUS) 30-500 MG TABS Take 2 tablets by mouth every 4 (four) hours as needed (allergies and congestion).    Historical Provider, MD   BP 119/84 mmHg  Pulse 76  Temp(Src) 98.4 F (36.9 C) (Oral)  Resp 16  SpO2 100%  LMP 02/21/2015 Physical Exam  Constitutional: She is oriented to person, place, and time. She appears well-developed and well-nourished. No distress.  HENT:  Head: Normocephalic.  Eyes: EOM are normal.  Neck: Normal range of motion. Neck supple.  Cardiovascular: Normal rate and regular rhythm.   Pulmonary/Chest: Effort normal and breath sounds normal.  Abdominal: Soft. Bowel sounds are normal. There is no tenderness.  Genitourinary:  External genitalia without lesions, moderate blood vaginal vault. No CMT, no adnexal tenderness or mass palpated. Uterus without palpable enlargement.   Musculoskeletal: Normal range of motion.  Neurological: She is alert and oriented to person, place, and time. No cranial nerve deficit.  Skin: Skin is warm and dry.  Psychiatric: She has a normal mood and affect. Her behavior is normal.  Nursing note and vitals reviewed.   ED Course  Procedures (including critical care time) Labs Review Results for orders placed or performed during the hospital encounter of 02/21/15 (from the past 24 hour(s))  Urinalysis, Routine w reflex microscopic (not at [facility])     Status: Abnormal   Collection Time: 02/21/15  6:50 PM  Result Value Ref Range   Color, Urine YELLOW YELLOW   APPearance CLEAR CLEAR   Specific Gravity, Urine 1.025 1.005 - 1.030   pH 7.0 5.0 - 8.0   Glucose, UA NEGATIVE NEGATIVE mg/dL   Hgb urine dipstick LARGE (A) NEGATIVE   Bilirubin Urine NEGATIVE NEGATIVE   Ketones, ur NEGATIVE NEGATIVE mg/dL   Protein, ur NEGATIVE NEGATIVE mg/dL   Urobilinogen, UA 1.0 0.0 - 1.0 mg/dL   Nitrite NEGATIVE NEGATIVE   Leukocytes, UA NEGATIVE NEGATIVE  Urine  microscopic-add on     Status: Abnormal   Collection Time: 02/21/15  6:50 PM  Result Value Ref Range   Squamous Epithelial / LPF FEW (A) RARE   RBC / HPF 3-6 <3 RBC/hpf   Urine-Other MUCOUS PRESENT   POC urine preg, ED (not at [facility])     Status: None   Collection Time: 02/21/15  7:10 PM  Result Value Ref Range   Preg Test, Ur NEGATIVE NEGATIVE  Wet prep, genital     Status: Abnormal   Collection Time: 02/21/15  7:31 PM  Result Value Ref Range   Yeast Wet Prep HPF POC NONE SEEN NONE SEEN   Trich, Wet Prep NONE SEEN NONE SEEN   Clue Cells Wet Prep HPF POC MANY (A) NONE SEEN   WBC, Wet Prep HPF POC FEW (A) NONE SEEN     MDM  34 y.o. female who vaginal d/c with odor. Will treat for BV and she will follow up with her GYN. Discussed with the patient clinical and lab findings and all questioned fully answered. Stable for d/c without pain.   Final diagnoses:  Bacterial vaginosis      Ashley Murrain, NP 02/21/15 2043  Gareth Morgan, MD 02/23/15 2233

## 2015-02-22 LAB — HIV ANTIBODY (ROUTINE TESTING W REFLEX): HIV Screen 4th Generation wRfx: NONREACTIVE

## 2015-02-22 LAB — GC/CHLAMYDIA PROBE AMP (~~LOC~~) NOT AT ARMC
Chlamydia: NEGATIVE
Neisseria Gonorrhea: POSITIVE — AB

## 2015-02-22 LAB — RPR: RPR Ser Ql: NONREACTIVE

## 2015-02-23 ENCOUNTER — Telehealth (HOSPITAL_BASED_OUTPATIENT_CLINIC_OR_DEPARTMENT_OTHER): Payer: Self-pay | Admitting: Emergency Medicine

## 2015-02-23 NOTE — Telephone Encounter (Signed)
Chart handoff to EDP for treatment plan + GC on 02/21/2015

## 2015-06-23 ENCOUNTER — Emergency Department (HOSPITAL_COMMUNITY)
Admission: EM | Admit: 2015-06-23 | Discharge: 2015-06-24 | Disposition: A | Discharge status: Home / Self Care | Payer: PRIVATE HEALTH INSURANCE | Attending: Emergency Medicine | Admitting: Emergency Medicine

## 2015-06-23 ENCOUNTER — Encounter (HOSPITAL_COMMUNITY): Payer: Self-pay | Admitting: Emergency Medicine

## 2015-06-23 DIAGNOSIS — E876 Hypokalemia: Secondary | ICD-10-CM | POA: Insufficient documentation

## 2015-06-23 DIAGNOSIS — Z8619 Personal history of other infectious and parasitic diseases: Secondary | ICD-10-CM | POA: Diagnosis not present

## 2015-06-23 DIAGNOSIS — R079 Chest pain, unspecified: Secondary | ICD-10-CM | POA: Insufficient documentation

## 2015-06-23 DIAGNOSIS — Z3202 Encounter for pregnancy test, result negative: Secondary | ICD-10-CM | POA: Diagnosis not present

## 2015-06-23 DIAGNOSIS — R067 Sneezing: Secondary | ICD-10-CM | POA: Diagnosis not present

## 2015-06-23 LAB — I-STAT CHEM 8, ED
BUN: 11 mg/dL (ref 6–20)
Calcium, Ion: 1.2 mmol/L (ref 1.12–1.23)
Chloride: 102 mmol/L (ref 101–111)
Creatinine, Ser: 0.7 mg/dL (ref 0.44–1.00)
Glucose, Bld: 120 mg/dL — ABNORMAL HIGH (ref 65–99)
HCT: 45 % (ref 36.0–46.0)
Hemoglobin: 15.3 g/dL — ABNORMAL HIGH (ref 12.0–15.0)
Potassium: 3.1 mmol/L — ABNORMAL LOW (ref 3.5–5.1)
Sodium: 141 mmol/L (ref 135–145)
TCO2: 25 mmol/L (ref 0–100)

## 2015-06-23 LAB — CBC WITH DIFFERENTIAL/PLATELET
Basophils Absolute: 0 10*3/uL (ref 0.0–0.1)
Basophils Relative: 0 %
Eosinophils Absolute: 0.5 10*3/uL (ref 0.0–0.7)
Eosinophils Relative: 5 %
HCT: 39.6 % (ref 36.0–46.0)
Hemoglobin: 13.4 g/dL (ref 12.0–15.0)
Lymphocytes Relative: 39 %
Lymphs Abs: 3.6 10*3/uL (ref 0.7–4.0)
MCH: 29.4 pg (ref 26.0–34.0)
MCHC: 33.8 g/dL (ref 30.0–36.0)
MCV: 86.8 fL (ref 78.0–100.0)
Monocytes Absolute: 0.4 10*3/uL (ref 0.1–1.0)
Monocytes Relative: 4 %
Neutro Abs: 4.6 10*3/uL (ref 1.7–7.7)
Neutrophils Relative %: 52 %
Platelets: 233 10*3/uL (ref 150–400)
RBC: 4.56 MIL/uL (ref 3.87–5.11)
RDW: 12.9 % (ref 11.5–15.5)
WBC: 9 10*3/uL (ref 4.0–10.5)

## 2015-06-23 LAB — I-STAT TROPONIN, ED: Troponin i, poc: 0 ng/mL (ref 0.00–0.08)

## 2015-06-23 NOTE — ED Notes (Signed)
Pt states this morning she coughed and it felt like she pulled a muscle in her chest  Pt states the pain goes through to her back  Pt states she feels short of breath when she lays down  Pt also states this evening she started having numbness in her right arm

## 2015-06-24 ENCOUNTER — Emergency Department (HOSPITAL_COMMUNITY): Payer: PRIVATE HEALTH INSURANCE

## 2015-06-24 LAB — POC URINE PREG, ED: Preg Test, Ur: NEGATIVE

## 2015-06-24 MED ORDER — POTASSIUM CHLORIDE CRYS ER 20 MEQ PO TBCR
40.0000 meq | EXTENDED_RELEASE_TABLET | Freq: Once | ORAL | Status: AC
  Administered 2015-06-24: 40 meq via ORAL
  Filled 2015-06-24: qty 2

## 2015-06-24 MED ORDER — NAPROXEN 500 MG PO TABS: 500.0000 mg | ORAL_TABLET | Freq: Two times a day (BID) | ORAL | Status: DC

## 2015-06-24 NOTE — ED Notes (Signed)
Pt described to this nurse that she sneezed this morning and felt like she pulled something. Ever since she has had centralized chest pain that radiates to her back and R shoulder.

## 2015-06-24 NOTE — Discharge Instructions (Signed)
Take the prescribed medication as directed. Follow-up with a primary care physician in the area-- see resource guide. Return to the ED for new or worsening symptoms.   Emergency Department Resource Guide 1) Find a Doctor and Pay Out of Pocket Although you won't have to find out who is covered by your insurance plan, it is a good idea to ask around and get recommendations. You will then need to call the office and see if the doctor you have chosen will accept you as a new patient and what types of options they offer for patients who are self-pay. Some doctors offer discounts or will set up payment plans for their patients who do not have insurance, but you will need to ask so you aren't surprised when you get to your appointment.  2) Contact Your Local Health Department Not all health departments have doctors that can see patients for sick visits, but many do, so it is worth a call to see if yours does. If you don't know where your local health department is, you can check in your phone book. The CDC also has a tool to help you locate your state's health department, and many state websites also have listings of all of their local health departments.  3) Find a [facility] If your illness is not likely to be very severe or complicated, you may want to try a walk in clinic. These are popping up all over the country in pharmacies, drugstores, and shopping centers. They're usually staffed by nurse practitioners or physician assistants that have been trained to treat common illnesses and complaints. They're usually fairly quick and inexpensive. However, if you have serious medical issues or chronic medical problems, these are probably not your best option.  No Primary Care Doctor: - Call Health Connect at  [phone] - they can help you locate a primary care doctor that  accepts your insurance, provides certain services, etc. - Physician Referral Service- [phone]  Chronic Pain  Problems: Organization         Address  Phone   Notes  [facility]  [phone] Patients need to be referred by their primary care doctor.   Medication Assistance: Organization         Address  Phone   Notes  [facility] Medication [facility] [address] [place], [place] [address] [phone] --Must be a resident of [facility] -- Must have NO insurance coverage whatsoever (no Medicaid/ Medicare, etc.) -- The pt. MUST have a primary care doctor that directs their care regularly and follows them in the community   MedAssist  [phone]   [employer]  [phone]    Agencies that provide inexpensive medical care: Organization         Address  Phone   Notes  [place]  [phone]   [facility]    [phone]   [facility] [place],  [phone]   [place] [address] [place] [phone]   Planned Parenthood    [phone]   [facility]    [phone]   [place] and [place] [address], [place] Phone:  [phone], Fax:  [phone] Hours of Operation:  9 am - 6 pm, M-F.  Also accepts Medicaid/Medicare and self-pay.  [facility] for [place] [address], [address], [place] Phone: [phone], Fax: [phone]. Hours of Operation:  8:30 am - 5:30 pm, M-F.  Also accepts Medicaid and self-pay.  [facility] [place] [address] [place] Phone: [phone]   [place], [place], [place] [phone], Ext. [phone] Mondays & Thursdays: 7-9 AM.  First 15 patients are seen on a first come, first serve basis.    [place] Providers:  Organization         Address  Phone   Notes  [facility] [address], [place] [phone] Also  accepts self-pay patients.  [facility] [id number] [place], [place]  [phone]   [place], [address], [place] [phone]   [facility] [address], [place] [phone]   Lucianne Lei [address], [place]   [phone] Only accepts [place] Access [place] patients after they have their name applied to their card.   Self-Pay (no insurance) in [facility]:  Organization         Address  Phone   Notes  Sickle Cell Patients, [facility] [place] [facility] [place] [phone]   [facility] [place] [phone]   [facility] [place]  [place], [place], [place] [phone]   [facility]/Dr. Osei-Bonsu  [address] [place] or [address], [place] [phone] Phone number for both [place] and [place] locations is the same.  [facility] [address] [place] [phone]   [facility] [address] [place] or [address] [phone]   [facility] [address], [place] [phone], phone; [phone], fax Sees patients 1st and 3rd Saturday of every month.  Must not qualify for public or private insurance (i.e. Medicaid, Medicare, [place] Health Choice, Veterans' Benefits) . Household income should be no more than 200% of the poverty level .The clinic cannot treat you if you are pregnant or think you are pregnant . Sexually transmitted diseases are not treated at the clinic.    Dental Care: Organization         Address  Phone  Notes  [facility] Department of [facility] [place] [phone] Accepts children up to age 13 who are enrolled in [place] or [place]; pregnant  women with a Medicaid card; and children who have applied for Medicaid or [place] Health Choice, but were declined, whose parents can pay a reduced fee at time of service.  [facility] Department of [facility]  [address], [place] [phone] Accepts children up to age 46 who are enrolled in [place] or [place]; pregnant women with a Medicaid card; and children who have applied for Medicaid or [place] Health Choice, but were declined, whose parents can pay a reduced fee at time of service.  [place] Adult Dental Access PROGRAM  [place] [phone] Patients are seen by appointment only. Walk-ins are not accepted. [place] will see patients 45 years of age and older. Monday - Tuesday (8am-5pm) Most Wednesdays (8:30-5pm) $30 per visit, cash only  [place]  [address], [facility] [phone] Patients are seen by appointment only. Walk-ins are not accepted. [place] will see patients 73 years of age and older. One Wednesday Evening (Monthly: Volunteer Based).  $30 per visit, cash only  [place]  [phone] for adults; Children under age 70, call Graduate Pediatric Dentistry at [phone]. Children aged 80-14, please call [phone] to request a pediatric application.  Dental services are provided in all areas of dental care including fillings, crowns and bridges, complete and partial dentures, implants, gum treatment, root canals, and extractions. Preventive care is also provided. Treatment is provided to both adults and children. Patients are selected via a lottery and there is often a waiting list.   [facility] [address], [place]  [phone] [email or web address]   [facility] [address] [place], [place] [phone], Ext. [phone] Second and Fourth Thursday of each month, opens at 6:30 AM; Clinic ends at 9 AM.  Patients are  seen on a first-come first-served basis, and a limited number are seen during each clinic.   [facility]  [address] Hillard Danker [place], [place] [phone]   Eligibility Requirements You must have lived in [place], [place], or [place] counties for at least the last three months.   You cannot be eligible for state or federal sponsored [employer], including [employer], [place], or [employer].   You generally cannot be eligible for healthcare insurance through your employer.    How to apply: Eligibility screenings are held every Tuesday and Wednesday afternoon from 1:00 pm until 4:00 pm. You do not need an appointment for the interview!  [facility] [address] [place], [place]   [place]  [place] Department  [place]  [phone]    Behavioral Health Resources in the Community: Intensive Outpatient Programs Organization         Address  Phone  Notes  [place]. [address] [place], [place] [phone]   [facility] Outpatient [address], [place], [place]   ADS: Alcohol & Drug Svcs [address] [place], [place]   [place] [address]  [place], [place] or [phone]   Substance Abuse Resources Organization         Address  Phone  Notes  Alcohol and Drug Services  [phone]   [place]  [phone]   The [place]   Chinita Pester  [phone]   Residential & Outpatient Substance Abuse Program  [phone]   Psychological Services Organization         Address  Phone  Notes  [facility] [place]  [place]  [phone]   [place] [address] [place] or [phone]    Mobile Crisis  Teams Organization         Address  Phone  Notes  [facility], Mobile Crisis Care Unit  [phone]   [facility]ive Psychotherapeutic Services  [address] [place], [place]   [place] [address], [place] [phone]    Self-Help/Support Groups Organization         Address  Phone             Notes  [place]. of [place] -  variety of support groups  [phone] Call for more information  Narcotics Anonymous (NA), [facility] [address], [employer] [place]  2 meetings at this location   Residential Facilities manager         Address  Phone  Notes  [facility]idential [facility] [place],    [place]  [phone]   [facility]  [address], [place] [phone], [place], [place]   [place], [place] [phone] Admissions: 8am-3pm M-F  Incentives Substance [place] [address],    [place], [place] [phone]   [facility] [address] [place], [place], [place]   [facility] [address],  [place], [place]   [facility]ight Programs - Intensive Outpatient [address] Kristeen [address], [place], [place]   [facility] ([place].,  [place], [place] [phone] or [phone]   Residential Treatment Services (RTS) [address], [place], [place] Accepts Medicaid  [facility] [place] [address]  [place] [phone] Substance Abuse/Addiction Treatment   [facility] Organization         Address  Phone  Notes  [facility]  [phone]   Domenic Schwab, PhD [address] Arlis Porta [place], [place]   [phone] or [phone]   [place], [place] [phone]   [facility] [address] [place], [place] [phone]  Insurance/Medicaid/sponsorship through [facility] Families [address] [place]                                    [place], [place] [phone] [place] [address], [place] [phone]    Dr. Adele Schilder  [phone]   [facility] [facility] Dept. 1) [address] [place] 2) [place] 3)  [place] [address], [place] [phone]  [phone]   [place] [phone] or [phone] (After Hours)

## 2015-06-24 NOTE — ED Provider Notes (Signed)
CSN: [id number]     Arrival date & time 06/23/15  2251 History   First MD Initiated Contact with Patient 06/24/15 0348     Chief Complaint  Patient presents with  . Chest Pain     (Consider location/radiation/quality/duration/timing/severity/associated sxs/prior Treatment) Patient is a 35 y.o. female presenting with chest pain. The history is provided by the patient and medical records.  Chest Pain  35 year old female with no significant past medical history presenting to the ED for chest pain. Patient states this morning she sneezed several times and began coughing. States afterwards he felt like she "pulled a muscle" in the right side of her chest. States initially pain was mild, however has increased in severity throughout the day. She states pain is now radiated into her right arm into her right upper back.  States she has some pain with deep breathing but no SOB.  She denies fever, chills, sweats.  No personal or family cardiac hx.  Patient has never been a smoker.  Patient is s/p tubal ligation, no exogenous estrogens.  No hx of DVT or PE.  Patient has taken motrin PTA without relief.  VSS.  Past Medical History  Diagnosis Date  . No pertinent past medical history   . Chlamydia contact, treated    Past Surgical History  Procedure Laterality Date  . Tubal ligation     Family History  Problem Relation Age of Onset  . Hypertension Mother    Social History  Substance Use Topics  . Smoking status: Never Smoker   . Smokeless tobacco: None  . Alcohol Use: No   OB History    Gravida Para Term Preterm AB TAB SAB Ectopic Multiple Living   2 2 2  0 0 0 0 0 0 2     Review of Systems  Cardiovascular: Positive for chest pain.  All other systems reviewed and are negative.     Allergies  Review of patient's allergies indicates no known allergies.  Home Medications   Prior to Admission medications   Medication Sig Start Date End Date Taking? Authorizing Provider  cetirizine  (ZYRTEC) 10 MG tablet Take 10 mg by mouth daily as needed for allergies.   Yes Historical Provider, MD  pseudoephedrine-acetaminophen (TYLENOL SINUS) 30-500 MG TABS Take 2 tablets by mouth every 4 (four) hours as needed (allergies and congestion).   Yes Historical Provider, MD  metroNIDAZOLE (FLAGYL) 500 MG tablet Take 1 tablet (500 mg total) by mouth 2 (two) times daily. Patient not taking: Reported on 06/24/2015 02/21/15   Ashley Murrain, NP   BP 118/82 mmHg  Pulse 90  Temp(Src) 98.8 F (37.1 C) (Oral)  Resp 18  Ht 5\' 5"  (1.651 m)  Wt 72.576 kg  BMI 26.63 kg/m2  SpO2 100%  LMP 06/04/2015 (Exact Date)   Physical Exam  Constitutional: She is oriented to person, place, and time. She appears well-developed and well-nourished. No distress.  HENT:  Head: Normocephalic and atraumatic.  Mouth/Throat: Oropharynx is clear and moist.  Eyes: Conjunctivae and EOM are normal. Pupils are equal, round, and reactive to light.  Neck: Normal range of motion. Neck supple.  Cardiovascular: Normal rate, regular rhythm and normal heart sounds.   Pulmonary/Chest: Effort normal and breath sounds normal. No respiratory distress. She has no wheezes. She has no rhonchi. She has no rales.  Right chest wall TTP without deformities; no bruising or signs of trauma; lungs clear bilaterally  Abdominal: Soft. Bowel sounds are normal. There is no tenderness. There is  no guarding.  Musculoskeletal: Normal range of motion.  Neurological: She is alert and oriented to person, place, and time.  Skin: Skin is warm and dry. She is not diaphoretic.  Psychiatric: She has a normal mood and affect.  Nursing note and vitals reviewed.   ED Course  Procedures (including critical care time) Labs Review Labs Reviewed  I-STAT CHEM 8, ED - Abnormal; Notable for the following:    Potassium 3.1 (*)    Glucose, Bld 120 (*)    Hemoglobin 15.3 (*)    All other components within normal limits  CBC WITH DIFFERENTIAL/PLATELET  Randolm Idol, ED  POC URINE PREG, ED    Imaging Review Dg Chest 2 View  06/24/2015  CLINICAL DATA:  35 year old female with chest pain EXAM: CHEST  2 VIEW COMPARISON:  Radiograph dated 11/06/2014 FINDINGS: The heart size and mediastinal contours are within normal limits. Both lungs are clear. The visualized skeletal structures are unremarkable. IMPRESSION: No active cardiopulmonary disease. Electronically Signed   By: Anner Crete M.D.   On: 06/24/2015 04:54   I have personally reviewed and evaluated these images and lab results as part of my medical decision-making.   EKG Interpretation   Date/Time:  Wednesday June 23 2015 23:12:20 EST Ventricular Rate:  78 PR Interval:  159 QRS Duration: 87 QT Interval:  375 QTC Calculation: 427 R Axis:   78 Text Interpretation:  Normal sinus rhythm PVC Confirmed by [facility]   MD, APRIL ([id number]) on 06/24/2015 3:49:14 AM      MDM   Final diagnoses:  Chest pain, unspecified chest pain type  Hypokalemia   35 year old female here with right-sided chest pain after sneezing today.  Patient's pain is entirely reproducible on exam and i feel is consistent with musculoskeletal pain. Her evaluation is benign aside from a mild hypokalemia at 3.1 which was replaced here in the ED. Patient has no cardiac risk factors, PERC negative.  VSS.  Feel she is stable for discharge. She will be started on anti-inflammatories. She was encouraged follow-up with her primary care physician.  Discussed plan with patient, he/she acknowledged understanding and agreed with plan of care.  Return precautions given for new or worsening symptoms.  Larene Pickett, PA-C 06/24/15 0600  Veatrice Kells, MD 06/24/15 [phone]

## 2015-07-24 ENCOUNTER — Emergency Department (HOSPITAL_COMMUNITY): Payer: No Typology Code available for payment source

## 2015-07-24 ENCOUNTER — Emergency Department (HOSPITAL_COMMUNITY)
Admission: EM | Admit: 2015-07-24 | Discharge: 2015-07-24 | Disposition: A | Discharge status: Home / Self Care | Payer: No Typology Code available for payment source | Attending: Emergency Medicine | Admitting: Emergency Medicine

## 2015-07-24 ENCOUNTER — Encounter (HOSPITAL_COMMUNITY): Payer: Self-pay | Admitting: Emergency Medicine

## 2015-07-24 DIAGNOSIS — Z791 Long term (current) use of non-steroidal anti-inflammatories (NSAID): Secondary | ICD-10-CM | POA: Insufficient documentation

## 2015-07-24 DIAGNOSIS — S299XXA Unspecified injury of thorax, initial encounter: Secondary | ICD-10-CM | POA: Diagnosis not present

## 2015-07-24 DIAGNOSIS — Y9389 Activity, other specified: Secondary | ICD-10-CM | POA: Insufficient documentation

## 2015-07-24 DIAGNOSIS — S8991XA Unspecified injury of right lower leg, initial encounter: Secondary | ICD-10-CM | POA: Insufficient documentation

## 2015-07-24 DIAGNOSIS — Y92410 Unspecified street and highway as the place of occurrence of the external cause: Secondary | ICD-10-CM | POA: Insufficient documentation

## 2015-07-24 DIAGNOSIS — S0990XA Unspecified injury of head, initial encounter: Secondary | ICD-10-CM | POA: Insufficient documentation

## 2015-07-24 DIAGNOSIS — S3991XA Unspecified injury of abdomen, initial encounter: Secondary | ICD-10-CM | POA: Insufficient documentation

## 2015-07-24 DIAGNOSIS — Z8619 Personal history of other infectious and parasitic diseases: Secondary | ICD-10-CM | POA: Diagnosis not present

## 2015-07-24 DIAGNOSIS — Y999 Unspecified external cause status: Secondary | ICD-10-CM | POA: Insufficient documentation

## 2015-07-24 DIAGNOSIS — R1084 Generalized abdominal pain: Secondary | ICD-10-CM

## 2015-07-24 DIAGNOSIS — S39012A Strain of muscle, fascia and tendon of lower back, initial encounter: Secondary | ICD-10-CM | POA: Diagnosis not present

## 2015-07-24 DIAGNOSIS — S3992XA Unspecified injury of lower back, initial encounter: Secondary | ICD-10-CM | POA: Diagnosis present

## 2015-07-24 LAB — BASIC METABOLIC PANEL
Anion gap: 11 (ref 5–15)
BUN: 9 mg/dL (ref 6–20)
CO2: 24 mmol/L (ref 22–32)
Calcium: 9.2 mg/dL (ref 8.9–10.3)
Chloride: 105 mmol/L (ref 101–111)
Creatinine, Ser: 0.71 mg/dL (ref 0.44–1.00)
GFR calc Af Amer: >60 mL/min (ref >60)
GFR calc non Af Amer: >60 mL/min (ref >60)
Glucose, Bld: 97 mg/dL (ref 65–99)
Potassium: 3.3 mmol/L — ABNORMAL LOW (ref 3.5–5.1)
Sodium: 140 mmol/L (ref 135–145)

## 2015-07-24 LAB — CBC WITH DIFFERENTIAL/PLATELET
Basophils Absolute: 0 10*3/uL (ref 0.0–0.1)
Basophils Relative: 0 %
Eosinophils Absolute: 0.2 10*3/uL (ref 0.0–0.7)
Eosinophils Relative: 2 %
HCT: 36.2 % (ref 36.0–46.0)
Hemoglobin: 12.4 g/dL (ref 12.0–15.0)
Lymphocytes Relative: 32 %
Lymphs Abs: 2.8 10*3/uL (ref 0.7–4.0)
MCH: 29.6 pg (ref 26.0–34.0)
MCHC: 34.3 g/dL (ref 30.0–36.0)
MCV: 86.4 fL (ref 78.0–100.0)
Monocytes Absolute: 0.6 10*3/uL (ref 0.1–1.0)
Monocytes Relative: 7 %
Neutro Abs: 5.1 10*3/uL (ref 1.7–7.7)
Neutrophils Relative %: 59 %
Platelets: 245 10*3/uL (ref 150–400)
RBC: 4.19 MIL/uL (ref 3.87–5.11)
RDW: 12.8 % (ref 11.5–15.5)
WBC: 8.7 10*3/uL (ref 4.0–10.5)

## 2015-07-24 LAB — I-STAT BETA HCG BLOOD, ED (MC, WL, AP ONLY): I-stat hCG, quantitative: <5 m[IU]/mL (ref <5)

## 2015-07-24 MED ORDER — HYDROCODONE-ACETAMINOPHEN 5-325 MG PO TABS: 1.0000 | ORAL_TABLET | Freq: Four times a day (QID) | ORAL | Status: DC | PRN

## 2015-07-24 MED ORDER — IOHEXOL 300 MG/ML  SOLN
80.0000 mL | Freq: Once | INTRAMUSCULAR | Status: AC | PRN
  Administered 2015-07-24: 100 mL via INTRAVENOUS

## 2015-07-24 MED ORDER — FENTANYL CITRATE (PF) 100 MCG/2ML IJ SOLN
50.0000 ug | Freq: Once | INTRAMUSCULAR | Status: AC
  Administered 2015-07-24: 50 ug via INTRAVENOUS
  Filled 2015-07-24: qty 2

## 2015-07-24 MED ORDER — NAPROXEN 500 MG PO TABS: 500.0000 mg | ORAL_TABLET | Freq: Two times a day (BID) | ORAL | Status: DC

## 2015-07-24 MED ORDER — SODIUM CHLORIDE 0.9 % IV BOLUS (SEPSIS)
1000.0000 mL | Freq: Once | INTRAVENOUS | Status: AC
Start: 2015-07-24 — End: 2015-07-24
  Administered 2015-07-24: 1000 mL via INTRAVENOUS

## 2015-07-24 MED ORDER — SODIUM CHLORIDE 0.9 % IV SOLN
INTRAVENOUS | Status: DC
  Administered 2015-07-24: 22:00:00 via INTRAVENOUS

## 2015-07-24 MED ORDER — ONDANSETRON HCL 4 MG/2ML IJ SOLN: 4.0000 mg | Freq: Once | INTRAMUSCULAR | Status: DC

## 2015-07-24 MED ORDER — ONDANSETRON HCL 4 MG/2ML IJ SOLN
4.0000 mg | Freq: Once | INTRAMUSCULAR | Status: AC
  Administered 2015-07-24: 4 mg via INTRAVENOUS
  Filled 2015-07-24: qty 2
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
## 2015-07-24 NOTE — ED Notes (Signed)
Pt departed in NAD.  
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
## 2015-07-24 NOTE — Discharge Instructions (Signed)
CT scans of head neck chest abdomen and pelvis without evidence of any acute injury. Also no bony injury to the back. Take pain medication as needed. Take the Naprosyn on a regular basis. Expect to be stiff and sore for the next 2 days. Return for any new or worse symptoms. Work note provided.

## 2015-10-04 ENCOUNTER — Ambulatory Visit: Payer: Self-pay | Admitting: Podiatry

## 2015-11-03 ENCOUNTER — Encounter (HOSPITAL_COMMUNITY): Payer: Self-pay | Admitting: Nurse Practitioner

## 2015-11-03 ENCOUNTER — Emergency Department (HOSPITAL_COMMUNITY)
Admission: EM | Admit: 2015-11-03 | Discharge: 2015-11-03 | Disposition: A | Discharge status: Home / Self Care | Payer: PRIVATE HEALTH INSURANCE | Attending: Emergency Medicine | Admitting: Emergency Medicine

## 2015-11-03 ENCOUNTER — Emergency Department (HOSPITAL_COMMUNITY): Payer: PRIVATE HEALTH INSURANCE

## 2015-11-03 DIAGNOSIS — Z792 Long term (current) use of antibiotics: Secondary | ICD-10-CM | POA: Insufficient documentation

## 2015-11-03 DIAGNOSIS — B9689 Other specified bacterial agents as the cause of diseases classified elsewhere: Secondary | ICD-10-CM | POA: Insufficient documentation

## 2015-11-03 DIAGNOSIS — Z79899 Other long term (current) drug therapy: Secondary | ICD-10-CM | POA: Insufficient documentation

## 2015-11-03 DIAGNOSIS — R52 Pain, unspecified: Secondary | ICD-10-CM

## 2015-11-03 DIAGNOSIS — N939 Abnormal uterine and vaginal bleeding, unspecified: Secondary | ICD-10-CM | POA: Diagnosis present

## 2015-11-03 DIAGNOSIS — N760 Acute vaginitis: Secondary | ICD-10-CM | POA: Diagnosis not present

## 2015-11-03 DIAGNOSIS — N898 Other specified noninflammatory disorders of vagina: Secondary | ICD-10-CM

## 2015-11-03 LAB — URINALYSIS, ROUTINE W REFLEX MICROSCOPIC
Bilirubin Urine: NEGATIVE
Glucose, UA: NEGATIVE mg/dL
Ketones, ur: NEGATIVE mg/dL
Leukocytes, UA: NEGATIVE
Nitrite: NEGATIVE
Protein, ur: NEGATIVE mg/dL
Specific Gravity, Urine: 1.014 (ref 1.005–1.030)
pH: 7.5 (ref 5.0–8.0)

## 2015-11-03 LAB — URINE MICROSCOPIC-ADD ON

## 2015-11-03 LAB — WET PREP, GENITAL
Sperm: NONE SEEN
Trich, Wet Prep: NONE SEEN
Yeast Wet Prep HPF POC: NONE SEEN

## 2015-11-03 LAB — PREGNANCY, URINE: Preg Test, Ur: NEGATIVE

## 2015-11-03 MED ORDER — LIDOCAINE HCL 1 % IJ SOLN
INTRAMUSCULAR | Status: AC
  Administered 2015-11-03: 20 mL
  Filled 2015-11-03: qty 20

## 2015-11-03 MED ORDER — METRONIDAZOLE 500 MG PO TABS: 500.0000 mg | ORAL_TABLET | Freq: Two times a day (BID) | ORAL | Status: DC

## 2015-11-03 MED ORDER — FLUCONAZOLE 200 MG PO TABS: 200.0000 mg | ORAL_TABLET | Freq: Every day | ORAL | Status: AC

## 2015-11-03 MED ORDER — ONDANSETRON 4 MG PO TBDP
4.0000 mg | ORAL_TABLET | Freq: Once | ORAL | Status: AC
  Administered 2015-11-03: 4 mg via ORAL
  Filled 2015-11-03: qty 1

## 2015-11-03 MED ORDER — CEFTRIAXONE SODIUM 250 MG IJ SOLR
250.0000 mg | Freq: Once | INTRAMUSCULAR | Status: AC
  Administered 2015-11-03: 250 mg via INTRAMUSCULAR
  Filled 2015-11-03: qty 250

## 2015-11-03 MED ORDER — AZITHROMYCIN 250 MG PO TABS
1000.0000 mg | ORAL_TABLET | Freq: Once | ORAL | Status: AC
  Administered 2015-11-03: 1000 mg via ORAL
  Filled 2015-11-03: qty 4

## 2015-11-03 NOTE — Discharge Instructions (Signed)
You were seen and evaluated tonight for your vaginal discharge. You were treated for STDs. Cultures to check for STDs percent. These results will not be back for 24-48 hours. Your results tonight also showed that he likely had bacterial vaginosis. Please take the antibiotic prescribed. Follow-up outpatient with her own provider or with the women's clinic as needed.  Bacterial Vaginosis Bacterial vaginosis is a vaginal infection that occurs when the normal balance of bacteria in the vagina is disrupted. It results from an overgrowth of certain bacteria. This is the most common vaginal infection in women of childbearing age. Treatment is important to prevent complications, especially in pregnant women, as it can cause a premature delivery. CAUSES  Bacterial vaginosis is caused by an increase in harmful bacteria that are normally present in smaller amounts in the vagina. Several different kinds of bacteria can cause bacterial vaginosis. However, the reason that the condition develops is not fully understood. RISK FACTORS Certain activities or behaviors can put you at an increased risk of developing bacterial vaginosis, including:  Having a new sex partner or multiple sex partners.  Douching.  Using an intrauterine device (IUD) for contraception. Women do not get bacterial vaginosis from toilet seats, bedding, swimming pools, or contact with objects around them. SIGNS AND SYMPTOMS  Some women with bacterial vaginosis have no signs or symptoms. Common symptoms include:  Grey vaginal discharge.  A fishlike odor with discharge, especially after sexual intercourse.  Itching or burning of the vagina and vulva.  Burning or pain with urination. DIAGNOSIS  Your health care provider will take a medical history and examine the vagina for signs of bacterial vaginosis. A sample of vaginal fluid may be taken. Your health care provider will look at this sample under a microscope to check for bacteria and  abnormal cells. A vaginal pH test may also be done.  TREATMENT  Bacterial vaginosis may be treated with antibiotic medicines. These may be given in the form of a pill or a vaginal cream. A second round of antibiotics may be prescribed if the condition comes back after treatment. Because bacterial vaginosis increases your risk for sexually transmitted diseases, getting treated can help reduce your risk for chlamydia, gonorrhea, HIV, and herpes. HOME CARE INSTRUCTIONS   Only take over-the-counter or prescription medicines as directed by your health care provider.  If antibiotic medicine was prescribed, take it as directed. Make sure you finish it even if you start to feel better.  Tell all sexual partners that you have a vaginal infection. They should see their health care provider and be treated if they have problems, such as a mild rash or itching.  During treatment, it is important that you follow these instructions:  Avoid sexual activity or use condoms correctly.  Do not douche.  Avoid alcohol as directed by your health care provider.  Avoid breastfeeding as directed by your health care provider. SEEK MEDICAL CARE IF:   Your symptoms are not improving after 3 days of treatment.  You have increased discharge or pain.  You have a fever. MAKE SURE YOU:   Understand these instructions.  Will watch your condition.  Will get help right away if you are not doing well or get worse. FOR MORE INFORMATION  Centers for Disease Control and Prevention, Division of STD Prevention: [employer] American Sexual Health Association (ASHA): www.ashastd.org    This information is not intended to replace advice given to you by your health care provider. Make sure you discuss any questions you have  with your health care provider.   Document Released: 05/29/2005 Document Revised: 06/19/2014 Document Reviewed: 01/08/2013 Elsevier Interactive Patient Education 2016 [employer].  Sexually  Transmitted Disease A sexually transmitted disease (STD) is a disease or infection that may be passed (transmitted) from person to person, usually during sexual activity. This may happen by way of saliva, semen, blood, vaginal mucus, or urine. Common STDs include:  Gonorrhea.  Chlamydia.  Syphilis.  HIV and AIDS.  Genital herpes.  Hepatitis B and C.  Trichomonas.  Human papillomavirus (HPV).  Pubic lice.  Scabies.  Mites.  Bacterial vaginosis. WHAT ARE CAUSES OF STDs? An STD may be caused by bacteria, a virus, or parasites. STDs are often transmitted during sexual activity if one person is infected. However, they may also be transmitted through nonsexual means. STDs may be transmitted after:   Sexual intercourse with an infected person.  Sharing sex toys with an infected person.  Sharing needles with an infected person or using unclean piercing or tattoo needles.  Having intimate contact with the genitals, mouth, or rectal areas of an infected person.  Exposure to infected fluids during birth. WHAT ARE THE SIGNS AND SYMPTOMS OF STDs? Different STDs have different symptoms. Some people may not have any symptoms. If symptoms are present, they may include:  Painful or bloody urination.  Pain in the pelvis, abdomen, vagina, anus, throat, or eyes.  A skin rash, itching, or irritation.  Growths, ulcerations, blisters, or sores in the genital and anal areas.  Abnormal vaginal discharge with or without bad odor.  Penile discharge in men.  Fever.  Pain or bleeding during sexual intercourse.  Swollen glands in the groin area.  Yellow skin and eyes (jaundice). This is seen with hepatitis.  Swollen testicles.  Infertility.  Sores and blisters in the mouth. HOW ARE STDs DIAGNOSED? To make a diagnosis, your health care provider may:  Take a medical history.  Perform a physical exam.  Take a sample of any discharge to examine.  Swab the throat, cervix,  opening to the penis, rectum, or vagina for testing.  Test a sample of your first morning urine.  Perform blood tests.  Perform a Pap test, if this applies.  Perform a colposcopy.  Perform a laparoscopy. HOW ARE STDs TREATED? Treatment depends on the STD. Some STDs may be treated but not cured.  Chlamydia, gonorrhea, trichomonas, and syphilis can be cured with antibiotic medicine.  Genital herpes, hepatitis, and HIV can be treated, but not cured, with prescribed medicines. The medicines lessen symptoms.  Genital warts from HPV can be treated with medicine or by freezing, burning (electrocautery), or surgery. Warts may come back.  HPV cannot be cured with medicine or surgery. However, abnormal areas may be removed from the cervix, vagina, or vulva.  If your diagnosis is confirmed, your recent sexual partners need treatment. This is true even if they are symptom-free or have a negative culture or evaluation. They should not have sex until their health care providers say it is okay.  Your health care provider may test you for infection again 3 months after treatment. HOW CAN I REDUCE MY RISK OF GETTING AN STD? Take these steps to reduce your risk of getting an STD:  Use latex condoms, dental dams, and water-soluble lubricants during sexual activity. Do not use petroleum jelly or oils.  Avoid having multiple sex partners.  Do not have sex with someone who has other sex partners  Do not have sex with anyone you do not  know or who is at high risk for an STD.  Avoid risky sex practices that can break your skin.  Do not have sex if you have open sores on your mouth or skin.  Avoid drinking too much alcohol or taking illegal drugs. Alcohol and drugs can affect your judgment and put you in a vulnerable position.  Avoid engaging in oral and anal sex acts.  Get vaccinated for HPV and hepatitis. If you have not received these vaccines in the past, talk to your health care provider  about whether one or both might be right for you.  If you are at risk of being infected with HIV, it is recommended that you take a prescription medicine daily to prevent HIV infection. This is called pre-exposure prophylaxis (PrEP). You are considered at risk if:  You are a man who has sex with other men (MSM).  You are a heterosexual man or woman and are sexually active with more than one partner.  You take drugs by injection.  You are sexually active with a partner who has HIV.  Talk with your health care provider about whether you are at high risk of being infected with HIV. If you choose to begin PrEP, you should first be tested for HIV. You should then be tested every 3 months for as long as you are taking PrEP. WHAT SHOULD I DO IF I THINK I HAVE AN STD?  See your health care provider.  Tell your sexual partner(s). They should be tested and treated for any STDs.  Do not have sex until your health care provider says it is okay. WHEN SHOULD I GET IMMEDIATE MEDICAL CARE? Contact your health care provider right away if:   You have severe abdominal pain.  You are a man and notice swelling or pain in your testicles.  You are a woman and notice swelling or pain in your vagina.   This information is not intended to replace advice given to you by your health care provider. Make sure you discuss any questions you have with your health care provider.   Document Released: 08/19/2002 Document Revised: 06/19/2014 Document Reviewed: 12/17/2012 Elsevier Interactive Patient Education [employer].

## 2015-11-03 NOTE — ED Provider Notes (Signed)
CSN: [id number]     Arrival date & time 11/03/15  1914 History   First MD Initiated Contact with Patient 11/03/15 2033     Chief Complaint  Patient presents with  . Vaginal Discharge  . Vaginal Bleeding     (Consider location/radiation/quality/duration/timing/severity/associated sxs/prior Treatment) HPI Comments: 35 year old female presents for concern for vaginal discharge. The patient states that she just got back into her relationship with her significant other month and half ago and that they had broken up for a period of time before hand. She is unsure if he had any sexual relations with anyone else during this time. She has only been sexually active with him over the last 10 years. She denies fevers or chills. Patient states that she has had vaginal discharge like this before and been told that she has bacterial vaginosis. Over the last 3-4 days she has had brownish discharge from her vagina. Denies any abdominal pain or pelvic tenderness. She does report that she had some mild bleeding after intercourse. Patient had her fallopian tubes tied in the past.   Past Medical History  Diagnosis Date  . No pertinent past medical history   . Chlamydia contact, treated    Past Surgical History  Procedure Laterality Date  . Tubal ligation     Family History  Problem Relation Age of Onset  . Hypertension Mother    Social History  Substance Use Topics  . Smoking status: Never Smoker   . Smokeless tobacco: None  . Alcohol Use: No   OB History    Gravida Para Term Preterm AB TAB SAB Ectopic Multiple Living   2 2 2  0 0 0 0 0 0 2     Review of Systems  Constitutional: Negative for fever, chills and fatigue.  HENT: Negative for congestion, postnasal drip, rhinorrhea and sinus pressure.   Eyes: Negative for visual disturbance.  Respiratory: Negative for cough, chest tightness and shortness of breath.   Cardiovascular: Negative for chest pain and palpitations.  Gastrointestinal:  Negative for nausea, vomiting, abdominal pain, diarrhea and constipation.  Genitourinary: Positive for vaginal bleeding (after intercourse) and vaginal discharge. Negative for dysuria, urgency, frequency and vaginal pain.  Musculoskeletal: Negative for myalgias and back pain.  Skin: Negative for rash.  Neurological: Negative for dizziness, weakness and headaches.  Hematological: Does not bruise/bleed easily.      Allergies  Review of patient's allergies indicates no known allergies.  Home Medications   Prior to Admission medications   Medication Sig Start Date End Date Taking? Authorizing Provider  cetirizine (ZYRTEC) 10 MG tablet Take 10 mg by mouth daily as needed for allergies.   Yes Historical Provider, MD  HYDROcodone-acetaminophen (NORCO/VICODIN) 5-325 MG tablet Take 1-2 tablets by mouth every 6 (six) hours as needed for moderate pain. Patient not taking: Reported on 11/03/2015 07/24/15   Fredia Sorrow, MD  metroNIDAZOLE (FLAGYL) 500 MG tablet Take 1 tablet (500 mg total) by mouth 2 (two) times daily. Patient not taking: Reported on 06/24/2015 02/21/15   Ashley Murrain, NP  naproxen (NAPROSYN) 500 MG tablet Take 1 tablet (500 mg total) by mouth 2 (two) times daily with a meal. Patient not taking: Reported on 11/03/2015 06/24/15   Larene Pickett, PA-C  naproxen (NAPROSYN) 500 MG tablet Take 1 tablet (500 mg total) by mouth 2 (two) times daily. Patient not taking: Reported on 11/03/2015 07/24/15   Fredia Sorrow, MD   BP 109/81 mmHg  Pulse 76  Temp(Src) 98.1 F (36.7 C) (Oral)  Resp 14  SpO2 100%  LMP 10/20/2015 (LMP Unknown) Physical Exam  Constitutional: She is oriented to person, place, and time. She appears well-developed and well-nourished. No distress.  HENT:  Head: Normocephalic and atraumatic.  Right Ear: External ear normal.  Left Ear: External ear normal.  Nose: Nose normal.  Mouth/Throat: Oropharynx is clear and moist. No oropharyngeal exudate.  Eyes: EOM are normal.  Pupils are equal, round, and reactive to light.  Neck: Normal range of motion. Neck supple.  Cardiovascular: Normal rate, regular rhythm, normal heart sounds and intact distal pulses.   No murmur heard. Pulmonary/Chest: Effort normal. No respiratory distress. She has no wheezes. She has no rales.  Abdominal: Soft. She exhibits no distension. There is no tenderness.  Genitourinary: Uterus normal. There is no rash or tenderness on the right labia. There is no rash or tenderness on the left labia. Cervix exhibits discharge (Thin, yellow brownish). Cervix exhibits no motion tenderness and no friability. Right adnexum displays no mass, no tenderness and no fullness. Left adnexum displays tenderness. Left adnexum displays no mass and no fullness. No erythema, tenderness or bleeding in the vagina. No foreign body around the vagina. No signs of injury around the vagina. Vaginal discharge (Thin, yellow brownish) found.  Musculoskeletal: Normal range of motion. She exhibits no edema or tenderness.  Neurological: She is alert and oriented to person, place, and time.  Skin: Skin is warm and dry. No rash noted. She is not diaphoretic.  Vitals reviewed.   ED Course  Procedures (including critical care time) Labs Review Labs Reviewed  URINALYSIS, ROUTINE W REFLEX MICROSCOPIC (NOT AT [facility]) - Abnormal; Notable for the following:    Hgb urine dipstick SMALL (*)    All other components within normal limits  URINE MICROSCOPIC-ADD ON - Abnormal; Notable for the following:    Squamous Epithelial / LPF 0-5 (*)    Bacteria, UA RARE (*)    All other components within normal limits  WET PREP, GENITAL  PREGNANCY, URINE  GC/CHLAMYDIA PROBE AMP ([place]) NOT AT [facility]    Imaging Review No results found. I have personally reviewed and evaluated these images and lab results as part of my medical decision-making.   EKG Interpretation None      MDM  Patient seen and evaluated in stable condition. UA  unremarkable. Wet prep consistent with BV. Patient was treated for STDs with Rocephin and azithromycin. Because of tenderness in the left adnexa ultrasound obtained which was unremarkable. Patient was discharged home in stable condition with a prescription for Flagyl. Final diagnoses:  None    1. Vaginal discharge 2. Evaluation for STDs 3. Bacterial vaginosis    Harvel Quale, MD 11/03/15 [phone]

## 2015-11-03 NOTE — ED Notes (Signed)
Pt reports that she has noticed a blood tinged white discharge, onset 3-4 days ago, endorses hx of BV remaking that she has had similar problem several incidences, denies any new sex partners but does give her partner the benefit of the doubt with regards to STIs. Denies pelvic tenderness or any other associated symptoms.

## 2015-11-03 NOTE — ED Notes (Signed)
Ultrasound at bedside

## 2015-11-04 LAB — GC/CHLAMYDIA PROBE AMP (~~LOC~~) NOT AT ARMC
Chlamydia: NEGATIVE
Neisseria Gonorrhea: NEGATIVE

## 2015-11-12 ENCOUNTER — Encounter (HOSPITAL_COMMUNITY): Payer: Self-pay | Admitting: *Deleted

## 2015-11-12 ENCOUNTER — Inpatient Hospital Stay (HOSPITAL_COMMUNITY)
Admission: AD | Admit: 2015-11-12 | Discharge: 2015-11-12 | Disposition: A | Discharge status: Home / Self Care | Payer: PRIVATE HEALTH INSURANCE | Source: Ambulatory Visit | Attending: Obstetrics & Gynecology | Admitting: Obstetrics & Gynecology

## 2015-11-12 DIAGNOSIS — T192XXA Foreign body in vulva and vagina, initial encounter: Secondary | ICD-10-CM

## 2015-11-12 LAB — WET PREP, GENITAL
Sperm: NONE SEEN
Trich, Wet Prep: NONE SEEN
Yeast Wet Prep HPF POC: NONE SEEN

## 2015-11-12 NOTE — Discharge Instructions (Signed)
Vaginal Foreign Body °A vaginal foreign body is any object that gets stuck or left inside the vagina. Vaginal foreign bodies left in the vagina for a long time can cause irritation and infection. In most cases, symptoms go away once the vaginal foreign body is found and removed. Rarely, a foreign object can break through the walls of the vagina and cause a serious infection inside the abdomen.  °CAUSES  °The most common vaginal foreign bodies are: °· Tampons. °· Contraceptive devices. °· Toilet tissue left in the vagina. °· Small objects that were placed in the vagina out of curiosity and got stuck. °· A result of sexual abuse.  °SIGNS AND SYMPTOMS °· Light vaginal bleeding. °· Blood-tinged vaginal fluid (discharge). °· Vaginal discharge that smells bad. °· Vaginal itching or burning. °· Redness, swelling, or rash near the opening of the vagina. °· Abdominal pain. °· Fever. °· Burning or frequent urination. °DIAGNOSIS  °Your health care provider may be able to diagnose a vaginal foreign body based on the information you provide, your symptoms, and a physical exam. Your health care provider may also perform the following tests to check for infection: °· A swab of the discharge to check under a microscope for bacteria (culture). °· A urine culture. °· An examination of the vagina with a small, lighted scope (vaginoscopy). °· Imaging tests to get a picture of the inside of your vagina, such as: °¨ Ultrasound. °¨ X-ray. °¨ MRI. °TREATMENT  °In most cases, a vaginal foreign body can be easily removed and the symptoms usually go away very quickly. Other treatment may include:  °· If the vaginal foreign body is not easily removed, medicine may be given to make you go to sleep (general anesthesia) to have the object removed. °· Emergency surgery may be necessary if an infection spreads through the walls of the vagina into the abdomen (acute abdomen). This is rare. °· You may need to take antibiotic medicine if you have a  vaginal or urinary tract infection. °HOME CARE INSTRUCTIONS  °· Take medicines only as directed by your health care provider. °· If you were prescribed an antibiotic medicine, finish it all even if you start to feel better. °· Do not have sex or use tampons until your health care provider says it is okay. °· Do not douche or use vaginal rinses unless your health care provider recommends it. °· Keep all follow-up visits as directed by your health care provider. This is important. °SEEK MEDICAL CARE IF: °· You have abdominal pain or burning pain when urinating. °· You have a fever. °SEEK IMMEDIATE MEDICAL CARE IF: °· You have heavy vaginal bleeding or discharge.   °· You have very bad abdominal pain.   °MAKE SURE YOU: °· Understand these instructions. °· Will watch your condition. °· Will get help right away if you are not doing well or get worse. °  °This information is not intended to replace advice given to you by your health care provider. Make sure you discuss any questions you have with your health care provider. °  °Document Released: 10/13/2013 Document Reviewed: 10/13/2013 °Elsevier Interactive Patient Education ©2016 Elsevier Inc. ° °

## 2015-11-12 NOTE — MAU Note (Signed)
Had intercourse Thurs night and think the condom came off and is still in me and starting to cause vag irritation. Denies d/c

## 2015-11-12 NOTE — Progress Notes (Signed)
J Rasch Np in earlier to discuss test results and d/c plan. Written and verbal d/c instructions given and understanding voiced.

## 2015-11-12 NOTE — MAU Provider Note (Signed)
History     CSN: [id number]  Arrival date and time: 11/12/15 2145   None     Chief Complaint  Patient presents with  . Foreign Body in Vagina   HPI   [name] is a 35 y.o. female G2P2002 here with concerns about a retained condom in her vagina. Last intercourse was 0500 and the condom was no where to be found.  She denies fever, chills or abdominal pain.   She is currently being treated for BV  OB History    Gravida Para Term Preterm AB TAB SAB Ectopic Multiple Living   2 2 2  0 0 0 0 0 0 2      Past Medical History  Diagnosis Date  . No pertinent past medical history   . Chlamydia contact, treated     Past Surgical History  Procedure Laterality Date  . Tubal ligation      Family History  Problem Relation Age of Onset  . Hypertension Mother     Social History  Substance Use Topics  . Smoking status: Never Smoker   . Smokeless tobacco: Not on file  . Alcohol Use: No    Allergies: No Known Allergies  Prescriptions prior to admission  Medication Sig Dispense Refill Last Dose  . cetirizine (ZYRTEC) 10 MG tablet Take 10 mg by mouth daily as needed for allergies.   11/03/2015 at Unknown time  . HYDROcodone-acetaminophen (NORCO/VICODIN) 5-325 MG tablet Take 1-2 tablets by mouth every 6 (six) hours as needed for moderate pain. (Patient not taking: Reported on 11/03/2015) 14 tablet 0 Completed Course at Unknown time  . metroNIDAZOLE (FLAGYL) 500 MG tablet Take 1 tablet (500 mg total) by mouth 2 (two) times daily. 14 tablet 0   . naproxen (NAPROSYN) 500 MG tablet Take 1 tablet (500 mg total) by mouth 2 (two) times daily with a meal. (Patient not taking: Reported on 11/03/2015) 30 tablet 0 Completed Course at Unknown time  . naproxen (NAPROSYN) 500 MG tablet Take 1 tablet (500 mg total) by mouth 2 (two) times daily. (Patient not taking: Reported on 11/03/2015) 14 tablet 0 Completed Course at Unknown time   Results for orders placed or performed during the  hospital encounter of 11/12/15 (from the past 48 hour(s))  Wet prep, genital     Status: Abnormal   Collection Time: 11/12/15 10:45 PM  Result Value Ref Range   Yeast Wet Prep HPF POC NONE SEEN NONE SEEN   Trich, Wet Prep NONE SEEN NONE SEEN   Clue Cells Wet Prep HPF POC PRESENT (A) NONE SEEN   WBC, Wet Prep HPF POC MODERATE (A) NONE SEEN    Comment: MODERATE BACTERIA SEEN   Sperm NONE SEEN      Review of Systems  Constitutional: Negative for fever and chills.  Gastrointestinal: Negative for nausea, vomiting, abdominal pain, diarrhea and constipation.   Physical Exam   Blood pressure 120/74, pulse 81, temperature 98.4 F (36.9 C), resp. rate 18, height 5\' 6"  (1.676 m), weight 162 lb 3.2 oz (73.573 kg), last menstrual period 10/24/2015.  Physical Exam  Constitutional: She is oriented to person, place, and time. She appears well-developed and well-nourished. No distress.  Respiratory: Effort normal.  Genitourinary:  Speculum exam: Vagina - Small amount of creamy discharge, no odor Cervix - No contact bleeding Intact Condom extracted without difficulty.  Bimanual exam: Cervix closed, No CMT  Uterus non tender, normal size Adnexa non tender, no masses bilateral  Wet prep done Chaperone present  for exam.  Musculoskeletal: Normal range of motion.  Neurological: She is alert and oriented to person, place, and time.  Skin: She is not diaphoretic.    MAU Course  Procedures  None  MDM  Wet prep   Assessment and Plan   A:  1. Foreign body in vagina, initial encounter     P:  Discharge home in stable condition Return to [facility] for emergencies     Lezlie Lye, NP 11/12/2015 8:18 AM

## 2015-11-12 NOTE — Progress Notes (Signed)
Condom removed intact. Wet prep collected and sent

## 2016-01-23 ENCOUNTER — Inpatient Hospital Stay (HOSPITAL_COMMUNITY)
Admission: AD | Admit: 2016-01-23 | Discharge: 2016-01-23 | Disposition: A | Discharge status: Home / Self Care | Payer: PRIVATE HEALTH INSURANCE | Source: Ambulatory Visit | Attending: Obstetrics & Gynecology | Admitting: Obstetrics & Gynecology

## 2016-01-23 ENCOUNTER — Encounter (HOSPITAL_COMMUNITY): Payer: Self-pay | Admitting: *Deleted

## 2016-01-23 DIAGNOSIS — Z202 Contact with and (suspected) exposure to infections with a predominantly sexual mode of transmission: Secondary | ICD-10-CM | POA: Insufficient documentation

## 2016-01-23 DIAGNOSIS — N760 Acute vaginitis: Secondary | ICD-10-CM | POA: Diagnosis not present

## 2016-01-23 DIAGNOSIS — B9689 Other specified bacterial agents as the cause of diseases classified elsewhere: Secondary | ICD-10-CM

## 2016-01-23 DIAGNOSIS — A499 Bacterial infection, unspecified: Secondary | ICD-10-CM

## 2016-01-23 DIAGNOSIS — N898 Other specified noninflammatory disorders of vagina: Secondary | ICD-10-CM | POA: Diagnosis present

## 2016-01-23 DIAGNOSIS — R109 Unspecified abdominal pain: Secondary | ICD-10-CM | POA: Diagnosis present

## 2016-01-23 LAB — URINALYSIS, ROUTINE W REFLEX MICROSCOPIC
Bilirubin Urine: NEGATIVE
Glucose, UA: NEGATIVE mg/dL
Ketones, ur: NEGATIVE mg/dL
Leukocytes, UA: NEGATIVE
Nitrite: NEGATIVE
Protein, ur: NEGATIVE mg/dL
Specific Gravity, Urine: 1.015 (ref 1.005–1.030)
pH: 6 (ref 5.0–8.0)

## 2016-01-23 LAB — POCT PREGNANCY, URINE: Preg Test, Ur: NEGATIVE

## 2016-01-23 LAB — URINE MICROSCOPIC-ADD ON
Bacteria, UA: NONE SEEN
WBC, UA: NONE SEEN WBC/hpf (ref 0–5)

## 2016-01-23 LAB — WET PREP, GENITAL
Sperm: NONE SEEN
Trich, Wet Prep: NONE SEEN
Yeast Wet Prep HPF POC: NONE SEEN

## 2016-01-23 MED ORDER — METRONIDAZOLE 0.75 % VA GEL: 1.0000 | Freq: Every day | VAGINAL | 0 refills | Status: DC

## 2016-01-23 MED ORDER — FLUCONAZOLE 150 MG PO TABS: 150.0000 mg | ORAL_TABLET | Freq: Every day | ORAL | 0 refills | Status: DC

## 2016-01-23 MED ORDER — ONDANSETRON 4 MG PO TBDP
4.0000 mg | ORAL_TABLET | Freq: Once | ORAL | Status: AC
  Administered 2016-01-23: 4 mg via ORAL
  Filled 2016-01-23: qty 1

## 2016-01-23 MED ORDER — METRONIDAZOLE 500 MG PO TABS: 500.0000 mg | ORAL_TABLET | Freq: Two times a day (BID) | ORAL | 0 refills | Status: DC

## 2016-01-23 MED ORDER — METRONIDAZOLE 500 MG PO TABS
2000.0000 mg | ORAL_TABLET | Freq: Once | ORAL | Status: AC
  Administered 2016-01-23: 2000 mg via ORAL
  Filled 2016-01-23: qty 4

## 2016-01-23 NOTE — Discharge Instructions (Signed)
Bacterial Vaginosis Bacterial vaginosis is a vaginal infection that occurs when the normal balance of bacteria in the vagina is disrupted. It results from an overgrowth of certain bacteria. This is the most common vaginal infection in women of childbearing age. Treatment is important to prevent complications, especially in pregnant women, as it can cause a premature delivery. CAUSES  Bacterial vaginosis is caused by an increase in harmful bacteria that are normally present in smaller amounts in the vagina. Several different kinds of bacteria can cause bacterial vaginosis. However, the reason that the condition develops is not fully understood. RISK FACTORS Certain activities or behaviors can put you at an increased risk of developing bacterial vaginosis, including:  Having a new sex partner or multiple sex partners.  Douching.  Using an intrauterine device (IUD) for contraception. Women do not get bacterial vaginosis from toilet seats, bedding, swimming pools, or contact with objects around them. SIGNS AND SYMPTOMS  Some women with bacterial vaginosis have no signs or symptoms. Common symptoms include:  Grey vaginal discharge.  A fishlike odor with discharge, especially after sexual intercourse.  Itching or burning of the vagina and vulva.  Burning or pain with urination. DIAGNOSIS  Your health care provider will take a medical history and examine the vagina for signs of bacterial vaginosis. A sample of vaginal fluid may be taken. Your health care provider will look at this sample under a microscope to check for bacteria and abnormal cells. A vaginal pH test may also be done.  TREATMENT  Bacterial vaginosis may be treated with antibiotic medicines. These may be given in the form of a pill or a vaginal cream. A second round of antibiotics may be prescribed if the condition comes back after treatment. Because bacterial vaginosis increases your risk for sexually transmitted diseases, getting  treated can help reduce your risk for chlamydia, gonorrhea, HIV, and herpes. HOME CARE INSTRUCTIONS   Only take over-the-counter or prescription medicines as directed by your health care provider.  If antibiotic medicine was prescribed, take it as directed. Make sure you finish it even if you start to feel better.  Tell all sexual partners that you have a vaginal infection. They should see their health care provider and be treated if they have problems, such as a mild rash or itching.  During treatment, it is important that you follow these instructions:  Avoid sexual activity or use condoms correctly.  Do not douche.  Avoid alcohol as directed by your health care provider.  Avoid breastfeeding as directed by your health care provider. SEEK MEDICAL CARE IF:   Your symptoms are not improving after 3 days of treatment.  You have increased discharge or pain.  You have a fever. MAKE SURE YOU:   Understand these instructions.  Will watch your condition.  Will get help right away if you are not doing well or get worse. FOR MORE INFORMATION  Centers for Disease Control and Prevention, Division of STD Prevention: [employer] American Sexual Health Association (ASHA): www.ashastd.org    This information is not intended to replace advice given to you by your health care provider. Make sure you discuss any questions you have with your health care provider.   Document Released: 05/29/2005 Document Revised: 06/19/2014 Document Reviewed: 01/08/2013 Elsevier Interactive Patient Education 2016 [employer]. Trichomoniasis Trichomoniasis is an infection caused by an organism called Trichomonas. The infection can affect both women and men. In women, the outer female genitalia and the vagina are affected. In men, the penis is mainly affected,  but the prostate and other reproductive organs can also be involved. Trichomoniasis is a sexually transmitted infection (STI) and is most often  passed to another person through sexual contact.  RISK FACTORS  Having unprotected sexual intercourse.  Having sexual intercourse with an infected partner. SIGNS AND SYMPTOMS  Symptoms of trichomoniasis in women include:  Abnormal gray-green frothy vaginal discharge.  Itching and irritation of the vagina.  Itching and irritation of the area outside the vagina. Symptoms of trichomoniasis in men include:   Penile discharge with or without pain.  Pain during urination. This results from inflammation of the urethra. DIAGNOSIS  Trichomoniasis may be found during a Pap test or physical exam. Your health care provider may use one of the following methods to help diagnose this infection:  Testing the pH of the vagina with a test tape.  Using a vaginal swab test that checks for the Trichomonas organism. A test is available that provides results within a few minutes.  Examining a urine sample.  Testing vaginal secretions. Your health care provider may test you for other STIs, including HIV. TREATMENT   You may be given medicine to fight the infection. Women should inform their health care provider if they could be or are pregnant. Some medicines used to treat the infection should not be taken during pregnancy.  Your health care provider may recommend over-the-counter medicines or creams to decrease itching or irritation.  Your sexual partner will need to be treated if infected.  Your health care provider may test you for infection again 3 months after treatment. HOME CARE INSTRUCTIONS   Take medicines only as directed by your health care provider.  Take over-the-counter medicine for itching or irritation as directed by your health care provider.  Do not have sexual intercourse while you have the infection.  Women should not douche or wear tampons while they have the infection.  Discuss your infection with your partner. Your partner may have gotten the infection from you, or you  may have gotten it from your partner.  Have your sex partner get examined and treated if necessary.  Practice safe, informed, and protected sex.  See your health care provider for other STI testing. SEEK MEDICAL CARE IF:   You still have symptoms after you finish your medicine.  You develop abdominal pain.  You have pain when you urinate.  You have bleeding after sexual intercourse.  You develop a rash.  Your medicine makes you sick or makes you throw up (vomit). MAKE SURE YOU:  Understand these instructions.  Will watch your condition.  Will get help right away if you are not doing well or get worse.   This information is not intended to replace advice given to you by your health care provider. Make sure you discuss any questions you have with your health care provider.   Document Released: 11/22/2000 Document Revised: 06/19/2014 Document Reviewed: 03/10/2013 Elsevier Interactive Patient Education [employer].

## 2016-01-23 NOTE — MAU Provider Note (Signed)
History     CSN: [id number]  Arrival date and time: 01/23/16 [id number]   First Provider Initiated Contact with Patient 01/23/16 [phone]      Chief Complaint  Patient presents with  . Abdominal Pain  . Vaginal Discharge   HPI   [name] is a 35 y.o. [phone] non-pregnant female here in MAU with vaginal discharge with odor.  The discharge is white with odor "fish smell" . She was diagnosed with trichomonas a few weeks ago and vomited up her flagyl right after she took it.   She has had no new sex partners. Her symptoms have worsened in the last 2 days.   OB History    Gravida Para Term Preterm AB Living   2 2 2  0 0 2   SAB TAB Ectopic Multiple Live Births   0 0 0 0        Past Medical History:  Diagnosis Date  . Chlamydia contact, treated   . No pertinent past medical history     Past Surgical History:  Procedure Laterality Date  . TUBAL LIGATION      Family History  Problem Relation Age of Onset  . Hypertension Mother     Social History  Substance Use Topics  . Smoking status: Never Smoker  . Smokeless tobacco: Never Used  . Alcohol use No    Allergies: No Known Allergies  Prescriptions Prior to Admission  Medication Sig Dispense Refill Last Dose  . cetirizine (ZYRTEC) 10 MG tablet Take 10 mg by mouth daily as needed for allergies.   11/03/2015 at Unknown time  . HYDROcodone-acetaminophen (NORCO/VICODIN) 5-325 MG tablet Take 1-2 tablets by mouth every 6 (six) hours as needed for moderate pain. (Patient not taking: Reported on 11/03/2015) 14 tablet 0 Completed Course at Unknown time  . metroNIDAZOLE (FLAGYL) 500 MG tablet Take 1 tablet (500 mg total) by mouth 2 (two) times daily. 14 tablet 0 11/12/2015 at Unknown time  . naproxen (NAPROSYN) 500 MG tablet Take 1 tablet (500 mg total) by mouth 2 (two) times daily with a meal. (Patient not taking: Reported on 11/03/2015) 30 tablet 0 Completed Course at Unknown time  . naproxen (NAPROSYN) 500 MG tablet Take 1  tablet (500 mg total) by mouth 2 (two) times daily. (Patient not taking: Reported on 11/03/2015) 14 tablet 0 Completed Course at Unknown time   Results for orders placed or performed during the hospital encounter of 01/23/16 (from the past 48 hour(s))  Urinalysis, Routine w reflex microscopic (not at [facility])     Status: Abnormal   Collection Time: 01/23/16  8:35 AM  Result Value Ref Range   Color, Urine YELLOW YELLOW   APPearance CLEAR CLEAR   Specific Gravity, Urine 1.015 1.005 - 1.030   pH 6.0 5.0 - 8.0   Glucose, UA NEGATIVE NEGATIVE mg/dL   Hgb urine dipstick MODERATE (A) NEGATIVE   Bilirubin Urine NEGATIVE NEGATIVE   Ketones, ur NEGATIVE NEGATIVE mg/dL   Protein, ur NEGATIVE NEGATIVE mg/dL   Nitrite NEGATIVE NEGATIVE   Leukocytes, UA NEGATIVE NEGATIVE  Urine microscopic-add on     Status: Abnormal   Collection Time: 01/23/16  8:35 AM  Result Value Ref Range   Squamous Epithelial / LPF 0-5 (A) NONE SEEN   WBC, UA NONE SEEN 0 - 5 WBC/hpf   RBC / HPF 0-5 0 - 5 RBC/hpf   Bacteria, UA NONE SEEN NONE SEEN  Pregnancy, urine POC     Status: None   Collection  Time: 01/23/16  8:50 AM  Result Value Ref Range   Preg Test, Ur NEGATIVE NEGATIVE    Comment:        THE SENSITIVITY OF THIS METHODOLOGY IS >24 mIU/mL   Wet prep, genital     Status: Abnormal   Collection Time: 01/23/16  9:00 AM  Result Value Ref Range   Yeast Wet Prep HPF POC NONE SEEN NONE SEEN   Trich, Wet Prep NONE SEEN NONE SEEN   Clue Cells Wet Prep HPF POC PRESENT (A) NONE SEEN   WBC, Wet Prep HPF POC MANY (A) NONE SEEN   Sperm NONE SEEN     Review of Systems  Constitutional: Negative for chills and fever.  Gastrointestinal: Negative for abdominal pain, nausea and vomiting.  Genitourinary: Negative for dysuria, frequency and urgency.   Physical Exam   Blood pressure 116/77, pulse 95, temperature 98.1 F (36.7 C), temperature source Oral, resp. rate 16, weight 160 lb 12.8 oz (72.9 kg), last menstrual period  12/23/2015.  Physical Exam  Constitutional: She is oriented to person, place, and time. She appears well-developed and well-nourished. No distress.  HENT:  Head: Normocephalic.  Eyes: Pupils are equal, round, and reactive to light.  Neck: Neck supple.  Respiratory: Effort normal.  Genitourinary:  Genitourinary Comments: Speculum exam: Vagina - Small amount of creamy discharge, + odor Cervix - + contact bleeding Bimanual exam: Cervix closed, no CMT  Uterus non tender, normal size Adnexa non tender, no masses bilaterally GC/Chlam, wet prep done Chaperone present for exam.  Musculoskeletal: Normal range of motion.  Neurological: She is alert and oriented to person, place, and time.  Skin: Skin is warm. She is not diaphoretic.  Psychiatric: Her behavior is normal.    MAU Course  Procedures  None  MDM  Zofran  Flagyl 2 grams given PO  Will also RX for 7 days of flagyl however patient is concerned she will not be able to tolerate Flagyl for 7 days. Offered her metrogel and the patient declined.   Assessment and Plan   A:  1. Exposure to STD   2. BV (bacterial vaginosis)     P:  Discharge home in stable condition  Rx: Flagyl X 7 days Start tomorrow. Patient will attempt to keep the flagyl down for 7 days.  Return to MAU for emergencies only .  Condoms always.    Lezlie Lye, NP 01/23/2016 10:55 AM

## 2016-01-24 LAB — GC/CHLAMYDIA PROBE AMP (~~LOC~~) NOT AT ARMC
Chlamydia: NEGATIVE
Neisseria Gonorrhea: NEGATIVE

## 2016-04-03 ENCOUNTER — Ambulatory Visit (HOSPITAL_COMMUNITY)
Admission: EM | Admit: 2016-04-03 | Discharge: 2016-04-03 | Disposition: A | Discharge status: Home / Self Care | Payer: PRIVATE HEALTH INSURANCE | Attending: Family Medicine | Admitting: Family Medicine

## 2016-04-03 ENCOUNTER — Encounter (HOSPITAL_COMMUNITY): Payer: Self-pay | Admitting: Emergency Medicine

## 2016-04-03 ENCOUNTER — Ambulatory Visit (INDEPENDENT_AMBULATORY_CARE_PROVIDER_SITE_OTHER): Payer: PRIVATE HEALTH INSURANCE

## 2016-04-03 DIAGNOSIS — S60221A Contusion of right hand, initial encounter: Secondary | ICD-10-CM

## 2016-04-03 MED ORDER — DICLOFENAC POTASSIUM 50 MG PO TABS: 50.0000 mg | ORAL_TABLET | Freq: Three times a day (TID) | ORAL | 0 refills | Status: DC

## 2016-04-03 NOTE — ED Notes (Signed)
Comfort     Measures     Applied

## 2016-04-03 NOTE — Discharge Instructions (Signed)
Warm soak, splint and medicine as needed. Return as needed.

## 2016-04-03 NOTE — ED Provider Notes (Signed)
[facility]    CSN: [id number] Arrival date & time: 04/03/16  1152     History   Chief Complaint Chief Complaint  Patient presents with  . Hand Injury    HPI Laurie Bell is a 35 y.o. female.   The history is provided by the patient.  Hand Injury  Location:  Hand Hand location:  [place] of R hand Injury: yes   Time since incident:  12 hours Mechanism of injury comment:  Hit a wall last eve, still sore. Pain details:    Quality:  Sharp   Radiates to:  Does not radiate   Onset quality:  Sudden   Progression:  Unchanged Dislocation: no   Prior injury to area:  No Associated symptoms: decreased range of motion, stiffness and swelling     Past Medical History:  Diagnosis Date  . Chlamydia contact, treated   . No pertinent past medical history     There are no active problems to display for this patient.   Past Surgical History:  Procedure Laterality Date  . TUBAL LIGATION      OB History    Gravida Para Term Preterm AB Living   2 2 2  0 0 2   SAB TAB Ectopic Multiple Live Births   0 0 0 0         Home Medications    Prior to Admission medications   Medication Sig Start Date End Date Taking? Authorizing Provider  cetirizine (ZYRTEC) 10 MG tablet Take 10 mg by mouth daily as needed for allergies.    Historical Provider, MD  fluconazole (DIFLUCAN) 150 MG tablet Take 1 tablet (150 mg total) by mouth daily. 01/23/16   Lezlie Lye, NP  metroNIDAZOLE (FLAGYL) 500 MG tablet Take 1 tablet (500 mg total) by mouth 2 (two) times daily. 01/23/16   Lezlie Lye, NP  naproxen (NAPROSYN) 500 MG tablet Take 1 tablet (500 mg total) by mouth 2 (two) times daily with a meal. Patient not taking: Reported on 11/03/2015 06/24/15   Larene Pickett, PA-C  naproxen (NAPROSYN) 500 MG tablet Take 1 tablet (500 mg total) by mouth 2 (two) times daily. Patient not taking: Reported on 11/03/2015 07/24/15   Fredia Sorrow, MD    Family History Family History    Problem Relation Age of Onset  . Hypertension Mother     Social History Social History  Substance Use Topics  . Smoking status: Never Smoker  . Smokeless tobacco: Never Used  . Alcohol use No     Allergies   Review of patient's allergies indicates no known allergies.   Review of Systems Review of Systems  Constitutional: Negative.   Musculoskeletal: Positive for joint swelling and stiffness. Negative for gait problem.  Skin: Positive for wound.  All other systems reviewed and are negative.    Physical Exam Triage Vital Signs ED Triage Vitals  Enc Vitals Group     BP 04/03/16 1157 106/70     Pulse Rate 04/03/16 1157 71     Resp 04/03/16 1157 18     Temp 04/03/16 1157 98.3 F (36.8 C)     Temp Source 04/03/16 1157 Oral     SpO2 04/03/16 1157 100 %     Weight --      Height --      Head Circumference --      Peak Flow --      Pain Score 04/03/16 1202 9     Pain Loc --  Pain Edu? --      Excl. in [place]? --    No data found.   Updated Vital Signs BP 106/70 (BP Location: Left Arm)   Pulse 71   Temp 98.3 F (36.8 C) (Oral)   Resp 18   LMP 03/18/2016 (Exact Date)   SpO2 100%   Visual Acuity Right Eye Distance:   Left Eye Distance:   Bilateral Distance:    Right Eye Near:   Left Eye Near:    Bilateral Near:     Physical Exam  Constitutional: She is oriented to person, place, and time. She appears well-developed and well-nourished. No distress.  Abdominal: Soft. Bowel sounds are normal.  Musculoskeletal: She exhibits tenderness and deformity.  Neurological: She is alert and oriented to person, place, and time.  Skin: Skin is warm and dry.  Nursing note and vitals reviewed.    UC Treatments / Results  Labs (all labs ordered are listed, but only abnormal results are displayed) Labs Reviewed - No data to display  EKG  EKG Interpretation None       Radiology No results found. X-rays reviewed and report per  radiologist.   Procedures Procedures (including critical care time)  Medications Ordered in UC Medications - No data to display   Initial Impression / Assessment and Plan / UC Course  I have reviewed the triage vital signs and the nursing notes.  Pertinent labs & imaging results that were available during my care of the patient were reviewed by me and considered in my medical decision making (see chart for details).  Clinical Course      Final Clinical Impressions(s) / UC Diagnoses   Final diagnoses:  None    New Prescriptions New Prescriptions   No medications on file     Billy Fischer, MD 04/03/16 1243

## 2016-04-03 NOTE — ED Triage Notes (Signed)
The patient presented to the [facility] with a complaint of right hand and arm pain secondary to an injury last night. The patient stated that she was playing with her sone and dog and hit a wall with her right hand.

## 2016-07-02 ENCOUNTER — Encounter (HOSPITAL_COMMUNITY): Payer: Self-pay | Admitting: Emergency Medicine

## 2016-07-02 ENCOUNTER — Emergency Department (HOSPITAL_COMMUNITY)
Admission: EM | Admit: 2016-07-02 | Discharge: 2016-07-02 | Disposition: A | Discharge status: Home / Self Care | Payer: PRIVATE HEALTH INSURANCE | Attending: Emergency Medicine | Admitting: Emergency Medicine

## 2016-07-02 DIAGNOSIS — R05 Cough: Secondary | ICD-10-CM | POA: Diagnosis present

## 2016-07-02 DIAGNOSIS — B349 Viral infection, unspecified: Secondary | ICD-10-CM | POA: Diagnosis not present

## 2016-07-02 DIAGNOSIS — Z79899 Other long term (current) drug therapy: Secondary | ICD-10-CM | POA: Diagnosis not present

## 2016-07-02 LAB — URINALYSIS, ROUTINE W REFLEX MICROSCOPIC
Bilirubin Urine: NEGATIVE
Glucose, UA: NEGATIVE mg/dL
Hgb urine dipstick: NEGATIVE
Ketones, ur: NEGATIVE mg/dL
Leukocytes, UA: NEGATIVE
Nitrite: NEGATIVE
Protein, ur: NEGATIVE mg/dL
Specific Gravity, Urine: 1.018 (ref 1.005–1.030)
pH: 7 (ref 5.0–8.0)

## 2016-07-02 LAB — COMPREHENSIVE METABOLIC PANEL
ALT: 8 U/L — ABNORMAL LOW (ref 14–54)
AST: 14 U/L — ABNORMAL LOW (ref 15–41)
Albumin: 4.1 g/dL (ref 3.5–5.0)
Alkaline Phosphatase: 78 U/L (ref 38–126)
Anion gap: 4 — ABNORMAL LOW (ref 5–15)
BUN: 9 mg/dL (ref 6–20)
CO2: 28 mmol/L (ref 22–32)
Calcium: 9.1 mg/dL (ref 8.9–10.3)
Chloride: 106 mmol/L (ref 101–111)
Creatinine, Ser: 0.76 mg/dL (ref 0.44–1.00)
GFR calc Af Amer: >60 mL/min (ref >60)
GFR calc non Af Amer: >60 mL/min (ref >60)
Glucose, Bld: 94 mg/dL (ref 65–99)
Potassium: 3.5 mmol/L (ref 3.5–5.1)
Sodium: 138 mmol/L (ref 135–145)
Total Bilirubin: 0.5 mg/dL (ref 0.3–1.2)
Total Protein: 7.3 g/dL (ref 6.5–8.1)

## 2016-07-02 LAB — I-STAT BETA HCG BLOOD, ED (MC, WL, AP ONLY): I-stat hCG, quantitative: <5 m[IU]/mL (ref <5)

## 2016-07-02 LAB — CBC
HCT: 38.3 % (ref 36.0–46.0)
Hemoglobin: 12.7 g/dL (ref 12.0–15.0)
MCH: 27.8 pg (ref 26.0–34.0)
MCHC: 33.2 g/dL (ref 30.0–36.0)
MCV: 83.8 fL (ref 78.0–100.0)
Platelets: 241 10*3/uL (ref 150–400)
RBC: 4.57 MIL/uL (ref 3.87–5.11)
RDW: 14.2 % (ref 11.5–15.5)
WBC: 6.5 10*3/uL (ref 4.0–10.5)

## 2016-07-02 LAB — LIPASE, BLOOD: Lipase: 26 U/L (ref 11–51)

## 2016-07-02 MED ORDER — ONDANSETRON 4 MG PO TBDP: 4.0000 mg | ORAL_TABLET | Freq: Three times a day (TID) | ORAL | 0 refills | Status: DC | PRN

## 2016-07-02 MED ORDER — OXYMETAZOLINE HCL 0.05 % NA SOLN: 1.0000 | Freq: Two times a day (BID) | NASAL | 0 refills | Status: DC

## 2016-07-02 MED ORDER — BENZONATATE 100 MG PO CAPS: 200.0000 mg | ORAL_CAPSULE | Freq: Two times a day (BID) | ORAL | 0 refills | Status: DC | PRN

## 2016-07-02 NOTE — ED Triage Notes (Signed)
Pt c/o v/d, body aches, cough with yellow phlegm, chills x couple days.

## 2016-07-02 NOTE — Discharge Instructions (Signed)
Take your medications as prescribed. I also recommend taking Tylenol and ibuprofen as prescribed over-the-counter, oximetry in between doses every 3-4 hours. Continue drinking fluids at home to remain hydrated. I recommend eating a bland diet for the next few days and taper symptoms have improved. Follow-up with your primary care provider in the next 3-4 days if symptoms have not improved. Return to the emergency department if symptoms worsen or new onset of headache, neck stiffness, difficulty breathing, coughing up blood, chest pain, abdominal pain, vomiting, unable to keep fluids down.

## 2016-07-02 NOTE — ED Provider Notes (Signed)
[place] DEPT Provider Note   CSN: [id number] Arrival date & time: 07/02/16  1631     History   Chief Complaint Chief Complaint  Patient presents with  . Emesis  . Diarrhea  . Cough  . Chills    HPI Laurie Bell is a 36 y.o. female.  HPI   Patient is a 36 year old female with no pertinent past medical history presents the ED with multiple complaints. Patient states over the past 4 days she has had chills, subjective fever, generalized body aches, nausea, productive cough (yellow/green sputum), sinus pressure, nasal congestion, sore throat. Patient reports having multiple episodes of NBNB vomiting and nonbloody diarrhea yesterday which she notes have since resolved. Patient reports multiple people have been sick at her work with similar symptoms. She states she has been taking over-the-counter Tylenol Cold and flu medication without relief. She also reports today she had worsening cough with associated shortness of breath, wheezing and chest tightness. Denies headache, neck stiffness, rash, hemoptysis, chest pain, abdominal pain, urinary symptoms, vaginal bleeding, vaginal discharge.  Past Medical History:  Diagnosis Date  . Chlamydia contact, treated   . No pertinent past medical history     There are no active problems to display for this patient.   Past Surgical History:  Procedure Laterality Date  . TUBAL LIGATION      OB History    Gravida Para Term Preterm AB Living   2 2 2  0 0 2   SAB TAB Ectopic Multiple Live Births   0 0 0 0         Home Medications    Prior to Admission medications   Medication Sig Start Date End Date Taking? Authorizing Provider  Phenylephrine-DM-GG-APAP (TYLENOL COLD/FLU SEVERE) 5-10-200-325 MG TABS Take 2 tablets by mouth every 8 (eight) hours as needed (cold symptoms).   Yes Historical Provider, MD  benzonatate (TESSALON) 100 MG capsule Take 2 capsules (200 mg total) by mouth 2 (two) times daily as needed for cough.  07/02/16   Nona Dell, PA-C  diclofenac (CATAFLAM) 50 MG tablet Take 1 tablet (50 mg total) by mouth 3 (three) times daily. Patient not taking: Reported on 07/02/2016 04/03/16   Billy Fischer, MD  metroNIDAZOLE (FLAGYL) 500 MG tablet Take 1 tablet (500 mg total) by mouth 2 (two) times daily. Patient not taking: Reported on 07/02/2016 01/23/16   Lezlie Lye, NP  naproxen (NAPROSYN) 500 MG tablet Take 1 tablet (500 mg total) by mouth 2 (two) times daily with a meal. Patient not taking: Reported on 11/03/2015 06/24/15   Larene Pickett, PA-C  naproxen (NAPROSYN) 500 MG tablet Take 1 tablet (500 mg total) by mouth 2 (two) times daily. Patient not taking: Reported on 11/03/2015 07/24/15   Fredia Sorrow, MD  ondansetron (ZOFRAN ODT) 4 MG disintegrating tablet Take 1 tablet (4 mg total) by mouth every 8 (eight) hours as needed for nausea or vomiting. 07/02/16   Nona Dell, PA-C  oxymetazoline (AFRIN NASAL SPRAY) 0.05 % nasal spray Place 1 spray into both nostrils 2 (two) times daily. Spray once into each nostril twice daily for up to the next 3 days. Do not use for more than 3 days to prevent rebound rhinorrhea. 07/02/16   Nona Dell, PA-C    Family History Family History  Problem Relation Age of Onset  . Hypertension Mother     Social History Social History  Substance Use Topics  . Smoking status: Never Smoker  . Smokeless tobacco:  Never Used  . Alcohol use No     Allergies   Patient has no known allergies.   Review of Systems Review of Systems  Constitutional: Positive for chills and fever (subjective).  HENT: Positive for congestion, sinus pressure and sore throat.   Respiratory: Positive for cough, chest tightness, shortness of breath and wheezing.   Gastrointestinal: Positive for diarrhea, nausea and vomiting.  Musculoskeletal: Positive for myalgias (generalized).  All other systems reviewed and are negative.    Physical Exam Updated Vital  Signs BP 110/76   Pulse 85   Temp 98.2 F (36.8 C) (Oral)   Resp 16   LMP 06/19/2016   SpO2 99%   Physical Exam  Constitutional: She is oriented to person, place, and time. She appears well-developed and well-nourished. No distress.  HENT:  Head: Normocephalic and atraumatic.  Right Ear: Tympanic membrane is not erythematous and not bulging. A middle ear effusion is present.  Left Ear: Tympanic membrane is not erythematous and not bulging. A middle ear effusion is present.  Nose: Rhinorrhea present. Right sinus exhibits maxillary sinus tenderness and frontal sinus tenderness. Left sinus exhibits maxillary sinus tenderness and frontal sinus tenderness.  Mouth/Throat: Uvula is midline, oropharynx is clear and moist and mucous membranes are normal. No oropharyngeal exudate, posterior oropharyngeal edema, posterior oropharyngeal erythema or tonsillar abscesses. No tonsillar exudate.  Eyes: Conjunctivae and EOM are normal. Pupils are equal, round, and reactive to light. Right eye exhibits no discharge. Left eye exhibits no discharge. No scleral icterus.  Neck: Normal range of motion. Neck supple.  Cardiovascular: Normal rate, regular rhythm, normal heart sounds and intact distal pulses.   Pulmonary/Chest: Effort normal and breath sounds normal. No respiratory distress. She has no wheezes. She has no rales. She exhibits no tenderness.  Abdominal: Soft. Bowel sounds are normal. She exhibits no distension and no mass. There is no tenderness. There is no rebound and no guarding. No hernia.  Musculoskeletal: She exhibits no edema.  Lymphadenopathy:    She has no cervical adenopathy.  Neurological: She is alert and oriented to person, place, and time.  Skin: Skin is warm and dry. She is not diaphoretic.  Nursing note and vitals reviewed.    ED Treatments / Results  Labs (all labs ordered are listed, but only abnormal results are displayed) Labs Reviewed  COMPREHENSIVE METABOLIC PANEL -  Abnormal; Notable for the following:       Result Value   AST 14 (*)    ALT 8 (*)    Anion gap 4 (*)    All other components within normal limits  URINALYSIS, ROUTINE W REFLEX MICROSCOPIC - Abnormal; Notable for the following:    APPearance HAZY (*)    All other components within normal limits  LIPASE, BLOOD  CBC  I-STAT BETA HCG BLOOD, ED ([facility], [facility], [facility] ONLY)    EKG  EKG Interpretation None       Radiology No results found.  Procedures Procedures (including critical care time)  Medications Ordered in ED Medications - No data to display   Initial Impression / Assessment and Plan / ED Course  I have reviewed the triage vital signs and the nursing notes.  Pertinent labs & imaging results that were available during my care of the patient were reviewed by me and considered in my medical decision making (see chart for details).     Patient with symptoms consistent with influenza vs viral URI.  Vitals are stable, afebrile.  No signs of dehydration, tolerating  PO's.  Lungs are clear. Due to patient's presentation and physical exam a chest x-ray was not ordered bc likely diagnosis of flu.  Discussed the cost versus benefit of Tamiflu treatment with the patient.  The patient understands that symptoms are greater than the recommended 24-48 hour window of treatment.  Patient will be discharged with instructions to orally hydrate, rest, and use over-the-counter medications such as anti-inflammatories ibuprofen and Aleve for muscle aches and Tylenol for fever.  Patient will also be given a cough suppressant.    Final Clinical Impressions(s) / ED Diagnoses   Final diagnoses:  Viral illness    New Prescriptions New Prescriptions   BENZONATATE (TESSALON) 100 MG CAPSULE    Take 2 capsules (200 mg total) by mouth 2 (two) times daily as needed for cough.   ONDANSETRON (ZOFRAN ODT) 4 MG DISINTEGRATING TABLET    Take 1 tablet (4 mg total) by mouth every 8 (eight) hours as needed for nausea  or vomiting.   OXYMETAZOLINE (AFRIN NASAL SPRAY) 0.05 % NASAL SPRAY    Place 1 spray into both nostrils 2 (two) times daily. Spray once into each nostril twice daily for up to the next 3 days. Do not use for more than 3 days to prevent rebound rhinorrhea.     [place], [place] 07/02/16 2119    Forde Dandy, MD 07/04/16 [phone]

## 2016-10-08 ENCOUNTER — Emergency Department (HOSPITAL_COMMUNITY): Payer: PRIVATE HEALTH INSURANCE

## 2016-10-08 ENCOUNTER — Encounter (HOSPITAL_COMMUNITY): Payer: Self-pay | Admitting: Emergency Medicine

## 2016-10-08 ENCOUNTER — Emergency Department (HOSPITAL_COMMUNITY)
Admission: EM | Admit: 2016-10-08 | Discharge: 2016-10-08 | Disposition: A | Discharge status: Home / Self Care | Payer: PRIVATE HEALTH INSURANCE | Attending: Emergency Medicine | Admitting: Emergency Medicine

## 2016-10-08 DIAGNOSIS — Z79899 Other long term (current) drug therapy: Secondary | ICD-10-CM | POA: Insufficient documentation

## 2016-10-08 DIAGNOSIS — N739 Female pelvic inflammatory disease, unspecified: Secondary | ICD-10-CM | POA: Insufficient documentation

## 2016-10-08 DIAGNOSIS — E876 Hypokalemia: Secondary | ICD-10-CM

## 2016-10-08 DIAGNOSIS — N730 Acute parametritis and pelvic cellulitis: Secondary | ICD-10-CM

## 2016-10-08 DIAGNOSIS — R102 Pelvic and perineal pain: Secondary | ICD-10-CM | POA: Diagnosis present

## 2016-10-08 LAB — WET PREP, GENITAL
Clue Cells Wet Prep HPF POC: NONE SEEN
Sperm: NONE SEEN
Trich, Wet Prep: NONE SEEN
Yeast Wet Prep HPF POC: NONE SEEN

## 2016-10-08 LAB — CBC
HCT: 36.7 % (ref 36.0–46.0)
Hemoglobin: 12.2 g/dL (ref 12.0–15.0)
MCH: 28.6 pg (ref 26.0–34.0)
MCHC: 33.2 g/dL (ref 30.0–36.0)
MCV: 86.2 fL (ref 78.0–100.0)
Platelets: 249 10*3/uL (ref 150–400)
RBC: 4.26 MIL/uL (ref 3.87–5.11)
RDW: 13.5 % (ref 11.5–15.5)
WBC: 8.5 10*3/uL (ref 4.0–10.5)

## 2016-10-08 LAB — URINALYSIS, ROUTINE W REFLEX MICROSCOPIC
Bilirubin Urine: NEGATIVE
Glucose, UA: NEGATIVE mg/dL
Ketones, ur: NEGATIVE mg/dL
Leukocytes, UA: NEGATIVE
Nitrite: NEGATIVE
Protein, ur: NEGATIVE mg/dL
Specific Gravity, Urine: 1.026 (ref 1.005–1.030)
pH: 5 (ref 5.0–8.0)

## 2016-10-08 LAB — COMPREHENSIVE METABOLIC PANEL
ALT: 9 U/L — ABNORMAL LOW (ref 14–54)
AST: 15 U/L (ref 15–41)
Albumin: 4.1 g/dL (ref 3.5–5.0)
Alkaline Phosphatase: 69 U/L (ref 38–126)
Anion gap: 6 (ref 5–15)
BUN: 11 mg/dL (ref 6–20)
CO2: 28 mmol/L (ref 22–32)
Calcium: 9 mg/dL (ref 8.9–10.3)
Chloride: 104 mmol/L (ref 101–111)
Creatinine, Ser: 0.68 mg/dL (ref 0.44–1.00)
GFR calc Af Amer: >60 mL/min (ref >60)
GFR calc non Af Amer: >60 mL/min (ref >60)
Glucose, Bld: 101 mg/dL — ABNORMAL HIGH (ref 65–99)
Potassium: 3 mmol/L — ABNORMAL LOW (ref 3.5–5.1)
Sodium: 138 mmol/L (ref 135–145)
Total Bilirubin: 0.6 mg/dL (ref 0.3–1.2)
Total Protein: 7.6 g/dL (ref 6.5–8.1)

## 2016-10-08 LAB — POC URINE PREG, ED: Preg Test, Ur: NEGATIVE

## 2016-10-08 LAB — LIPASE, BLOOD: Lipase: 33 U/L (ref 11–51)

## 2016-10-08 MED ORDER — POTASSIUM CHLORIDE CRYS ER 20 MEQ PO TBCR
40.0000 meq | EXTENDED_RELEASE_TABLET | Freq: Once | ORAL | Status: AC
  Administered 2016-10-08: 40 meq via ORAL
  Filled 2016-10-08: qty 2

## 2016-10-08 MED ORDER — NAPROXEN 500 MG PO TABS: 500.0000 mg | ORAL_TABLET | Freq: Two times a day (BID) | ORAL | 0 refills | Status: DC

## 2016-10-08 MED ORDER — MORPHINE SULFATE (PF) 4 MG/ML IV SOLN
4.0000 mg | Freq: Once | INTRAVENOUS | Status: AC
  Administered 2016-10-08: 4 mg via INTRAVENOUS
  Filled 2016-10-08: qty 1

## 2016-10-08 MED ORDER — DOXYCYCLINE HYCLATE 100 MG PO CAPS: 100.0000 mg | ORAL_CAPSULE | Freq: Two times a day (BID) | ORAL | 0 refills | Status: AC

## 2016-10-08 MED ORDER — STERILE WATER FOR INJECTION IJ SOLN
INTRAMUSCULAR | Status: AC
  Administered 2016-10-08: 08:00:00
  Filled 2016-10-08: qty 10

## 2016-10-08 MED ORDER — DOXYCYCLINE HYCLATE 100 MG PO TABS
100.0000 mg | ORAL_TABLET | Freq: Once | ORAL | Status: AC
  Administered 2016-10-08: 100 mg via ORAL
  Filled 2016-10-08: qty 1

## 2016-10-08 MED ORDER — CEFTRIAXONE SODIUM 250 MG IJ SOLR
250.0000 mg | Freq: Once | INTRAMUSCULAR | Status: AC
  Administered 2016-10-08: 250 mg via INTRAMUSCULAR
  Filled 2016-10-08: qty 250

## 2016-10-08 NOTE — Discharge Instructions (Addendum)
It was our pleasure to provide your ER care today - we hope that you feel better.  Take medication as prescribed.  From today's labs, your potassium is low (3) - eat plenty of fruits and vegetables, and follow up with primary care doctor in 1 week for recheck.   Your ultrasound was read as showing no acute process....incidental note was made of small uterine fibroid.   Follow up with primary care doctor in 1 week if symptoms fail to improve/resolve.  Return to ER if worse, new symptoms, fevers, worsening or severe pain, other concern.

## 2016-10-08 NOTE — ED Triage Notes (Signed)
Pt reports having right lower abd pain and pelvic pain. Pt states that she is still having spotting after period stopped 2 weeks ago. Pt reports prior hx of UTI.

## 2016-10-08 NOTE — ED Provider Notes (Signed)
Signed out by Dr Regenia Skeeter to d/c to home after u/s resulted.  u/s neg acute.  On recheck pt comfortable. abd soft nt.   Patient appears stable for d/c.      Lajean Saver, MD 10/08/16 [phone]

## 2016-10-08 NOTE — ED Notes (Signed)
Patient transported to Ultrasound 

## 2016-10-08 NOTE — ED Provider Notes (Signed)
[facility] DEPT Provider Note   CSN: [id number] Arrival date & time: 10/08/16  8341     History   Chief Complaint Chief Complaint  Patient presents with  . Pelvic Pain    HPI Laurie Bell is a 36 y.o. female.  HPI  36 year old female presents with a chief complaint of abdominal pain and vaginal bleeding. Patient states she has been having spotting for the last 3 weeks. She had her normal menstrual cycle about one week ago and then resumed spotting. About one week ago she started having some lower abdominal pain, worst in the right side. However tonight at work around 7:30 it seemed to get significantly worse. The pain is now also on her left side. Denies any vaginal discharge. No vomiting or urinary symptoms. Has been taking ibuprofen for the pain with no relief. Pain is currently 8/10  Past Medical History:  Diagnosis Date  . Chlamydia contact, treated   . No pertinent past medical history     There are no active problems to display for this patient.   Past Surgical History:  Procedure Laterality Date  . TUBAL LIGATION      OB History    Gravida Para Term Preterm AB Living   2 2 2  0 0 2   SAB TAB Ectopic Multiple Live Births   0 0 0 0         Home Medications    Prior to Admission medications   Medication Sig Start Date End Date Taking? Authorizing Provider  doxycycline (VIBRAMYCIN) 100 MG capsule Take 1 capsule (100 mg total) by mouth 2 (two) times daily. 10/08/16 10/22/16  Sherwood Gambler, MD  naproxen (NAPROSYN) 500 MG tablet Take 1 tablet (500 mg total) by mouth 2 (two) times daily with a meal. 10/08/16   Sherwood Gambler, MD    Family History Family History  Problem Relation Age of Onset  . Hypertension Mother     Social History Social History  Substance Use Topics  . Smoking status: Never Smoker  . Smokeless tobacco: Never Used  . Alcohol use No     Allergies   Patient has no known allergies.   Review of Systems Review of Systems    Constitutional: Negative for fever.  Gastrointestinal: Positive for abdominal pain. Negative for nausea and vomiting.  Genitourinary: Positive for vaginal bleeding. Negative for dysuria and vaginal discharge.  Musculoskeletal: Positive for back pain (low back).  All other systems reviewed and are negative.    Physical Exam Updated Vital Signs BP 109/82 (BP Location: Right Arm)   Pulse 88   Temp 97.8 F (36.6 C) (Oral)   Resp 18   Ht 5\' 7"  (1.702 m)   Wt 145 lb (65.8 kg)   LMP 09/22/2016   SpO2 100%   BMI 22.71 kg/m   Physical Exam  Constitutional: She is oriented to person, place, and time. She appears well-developed and well-nourished. No distress.  HENT:  Head: Normocephalic and atraumatic.  Right Ear: External ear normal.  Left Ear: External ear normal.  Nose: Nose normal.  Eyes: Right eye exhibits no discharge. Left eye exhibits no discharge.  Cardiovascular: Normal rate, regular rhythm and normal heart sounds.   Pulmonary/Chest: Effort normal and breath sounds normal.  Abdominal: Soft. There is tenderness (mild) in the right lower quadrant, suprapubic area and left lower quadrant.  Genitourinary: Cervix exhibits motion tenderness (mild) and discharge. Right adnexum displays no mass. Left adnexum displays no mass. Vaginal discharge (copius) found.  Neurological: She  is alert and oriented to person, place, and time.  Skin: Skin is warm and dry. She is not diaphoretic.  Nursing note and vitals reviewed.    ED Treatments / Results  Labs (all labs ordered are listed, but only abnormal results are displayed) Labs Reviewed  COMPREHENSIVE METABOLIC PANEL - Abnormal; Notable for the following:       Result Value   Potassium 3.0 (*)    Glucose, Bld 101 (*)    ALT 9 (*)    All other components within normal limits  URINALYSIS, ROUTINE W REFLEX MICROSCOPIC - Abnormal; Notable for the following:    APPearance HAZY (*)    Hgb urine dipstick SMALL (*)    Bacteria, UA RARE  (*)    Squamous Epithelial / LPF 6-30 (*)    All other components within normal limits  WET PREP, GENITAL  LIPASE, BLOOD  CBC  POC URINE PREG, ED  GC/CHLAMYDIA PROBE AMP ([place]) NOT AT [facility]    EKG  EKG Interpretation None       Radiology No results found.  Procedures Procedures (including critical care time)  Medications Ordered in ED Medications  cefTRIAXone (ROCEPHIN) injection 250 mg (not administered)  doxycycline (VIBRA-TABS) tablet 100 mg (not administered)  potassium chloride SA (K-DUR,KLOR-CON) CR tablet 40 mEq (40 mEq Oral Given 10/08/16 0559)  morphine 4 MG/ML injection 4 mg (4 mg Intravenous Given 10/08/16 0603)     Initial Impression / Assessment and Plan / ED Course  I have reviewed the triage vital signs and the nursing notes.  Pertinent labs & imaging results that were available during my care of the patient were reviewed by me and considered in my medical decision making (see chart for details).     Patient's pain has improved. I believe this is pelvic in origin. No vomiting or fever. Doubt appendicitis given benign presentation and no other findings (normal WBC, no fever, vomiting, pain is more diffuse and pelvic). Exam c/w cervicitis, probably mild PID. Given this and prior STI history, will treat with rocephin/doxycycline. U/s obtained given lateral pain to r/o torsion or TOA. Care to Dr. Ashok Cordia, d/c if u/s benign, add flagyl if wet prep positive for trich or clue cells. Discussed need for f/u with OBGYN.   Final Clinical Impressions(s) / ED Diagnoses   Final diagnoses:  PID (acute pelvic inflammatory disease)    New Prescriptions New Prescriptions   DOXYCYCLINE (VIBRAMYCIN) 100 MG CAPSULE    Take 1 capsule (100 mg total) by mouth 2 (two) times daily.   NAPROXEN (NAPROSYN) 500 MG TABLET    Take 1 tablet (500 mg total) by mouth 2 (two) times daily with a meal.     Sherwood Gambler, MD 10/08/16 [phone]

## 2016-10-09 LAB — GC/CHLAMYDIA PROBE AMP (~~LOC~~) NOT AT ARMC
Chlamydia: NEGATIVE
Neisseria Gonorrhea: NEGATIVE

## 2016-11-07 ENCOUNTER — Ambulatory Visit (INDEPENDENT_AMBULATORY_CARE_PROVIDER_SITE_OTHER): Payer: Self-pay | Admitting: Obstetrics and Gynecology

## 2016-11-07 ENCOUNTER — Encounter: Payer: Self-pay | Admitting: Obstetrics and Gynecology

## 2016-11-07 ENCOUNTER — Other Ambulatory Visit (HOSPITAL_COMMUNITY)
Admission: RE | Admit: 2016-11-07 | Discharge: 2016-11-07 | Disposition: A | Discharge status: Home / Self Care | Payer: Self-pay | Source: Ambulatory Visit | Attending: Obstetrics and Gynecology | Admitting: Obstetrics and Gynecology

## 2016-11-07 VITALS — BP 117/75 | HR 71 | Ht 66.0 in | Wt 147.0 lb

## 2016-11-07 DIAGNOSIS — N938 Other specified abnormal uterine and vaginal bleeding: Secondary | ICD-10-CM

## 2016-11-07 DIAGNOSIS — Z01812 Encounter for preprocedural laboratory examination: Secondary | ICD-10-CM

## 2016-11-07 DIAGNOSIS — Z3202 Encounter for pregnancy test, result negative: Secondary | ICD-10-CM

## 2016-11-07 LAB — POCT PREGNANCY, URINE: Preg Test, Ur: NEGATIVE

## 2016-11-07 MED ORDER — IBUPROFEN 200 MG PO TABS
800.0000 mg | ORAL_TABLET | Freq: Once | ORAL | Status: AC
  Administered 2016-11-07: 800 mg via ORAL

## 2016-11-07 MED ORDER — MEDROXYPROGESTERONE ACETATE 10 MG PO TABS: 20.0000 mg | ORAL_TABLET | Freq: Every day | ORAL | 2 refills | Status: DC

## 2016-11-07 MED ORDER — FLUCONAZOLE 150 MG PO TABS: 150.0000 mg | ORAL_TABLET | Freq: Once | ORAL | 0 refills | Status: AC

## 2016-11-07 NOTE — Patient Instructions (Signed)
Contraception Choices Contraception (birth control) is the use of any methods or devices to prevent pregnancy. Below are some methods to help avoid pregnancy. Hormonal methods  Contraceptive implant. This is a thin, plastic tube containing progesterone hormone. It does not contain estrogen hormone. Your health care provider inserts the tube in the inner part of the upper arm. The tube can remain in place for up to 3 years. After 3 years, the implant must be removed. The implant prevents the ovaries from releasing an egg (ovulation), thickens the cervical mucus to prevent sperm from entering the uterus, and thins the lining of the inside of the uterus.  Progesterone-only injections. These injections are given every 3 months by your health care provider to prevent pregnancy. This synthetic progesterone hormone stops the ovaries from releasing eggs. It also thickens cervical mucus and changes the uterine lining. This makes it harder for sperm to survive in the uterus.  Birth control pills. These pills contain estrogen and progesterone hormone. They work by preventing the ovaries from releasing eggs (ovulation). They also cause the cervical mucus to thicken, preventing the sperm from entering the uterus. Birth control pills are prescribed by a health care provider.Birth control pills can also be used to treat heavy periods.  Minipill. This type of birth control pill contains only the progesterone hormone. They are taken every day of each month and must be prescribed by your health care provider.  Birth control patch. The patch contains hormones similar to those in birth control pills. It must be changed once a week and is prescribed by a health care provider.  Vaginal ring. The ring contains hormones similar to those in birth control pills. It is left in the vagina for 3 weeks, removed for 1 week, and then a new one is put back in place. The patient must be comfortable inserting and removing the ring from  the vagina.A health care provider's prescription is necessary.  Emergency contraception. Emergency contraceptives prevent pregnancy after unprotected sexual intercourse. This pill can be taken right after sex or up to 5 days after unprotected sex. It is most effective the sooner you take the pills after having sexual intercourse. Most emergency contraceptive pills are available without a prescription. Check with your pharmacist. Do not use emergency contraception as your only form of birth control. Barrier methods  Female condom. This is a thin sheath (latex or rubber) that is worn over the penis during sexual intercourse. It can be used with spermicide to increase effectiveness.  Female condom. This is a soft, loose-fitting sheath that is put into the vagina before sexual intercourse.  Diaphragm. This is a soft, latex, dome-shaped barrier that must be fitted by a health care provider. It is inserted into the vagina, along with a spermicidal jelly. It is inserted before intercourse. The diaphragm should be left in the vagina for 6 to 8 hours after intercourse.  Cervical cap. This is a round, soft, latex or plastic cup that fits over the cervix and must be fitted by a health care provider. The cap can be left in place for up to 48 hours after intercourse.  Sponge. This is a soft, circular piece of polyurethane foam. The sponge has spermicide in it. It is inserted into the vagina after wetting it and before sexual intercourse.  Spermicides. These are chemicals that kill or block sperm from entering the cervix and uterus. They come in the form of creams, jellies, suppositories, foam, or tablets. They do not require a prescription. They   are inserted into the vagina with an applicator before having sexual intercourse. The process must be repeated every time you have sexual intercourse. Intrauterine contraception  Intrauterine device (IUD). This is a T-shaped device that is put in a woman's uterus during  a menstrual period to prevent pregnancy. There are 2 types:  Copper IUD. This type of IUD is wrapped in copper wire and is placed inside the uterus. Copper makes the uterus and fallopian tubes produce a fluid that kills sperm. It can stay in place for 10 years.  Hormone IUD. This type of IUD contains the hormone progestin (synthetic progesterone). The hormone thickens the cervical mucus and prevents sperm from entering the uterus, and it also thins the uterine lining to prevent implantation of a fertilized egg. The hormone can weaken or kill the sperm that get into the uterus. It can stay in place for 3-5 years, depending on which type of IUD is used. Permanent methods of contraception  Female tubal ligation. This is when the woman's fallopian tubes are surgically sealed, tied, or blocked to prevent the egg from traveling to the uterus.  Hysteroscopic sterilization. This involves placing a small coil or insert into each fallopian tube. Your doctor uses a technique called hysteroscopy to do the procedure. The device causes scar tissue to form. This results in permanent blockage of the fallopian tubes, so the sperm cannot fertilize the egg. It takes about 3 months after the procedure for the tubes to become blocked. You must use another form of birth control for these 3 months.  Female sterilization. This is when the female has the tubes that carry sperm tied off (vasectomy).This blocks sperm from entering the vagina during sexual intercourse. After the procedure, the man can still ejaculate fluid (semen). Natural planning methods  Natural family planning. This is not having sexual intercourse or using a barrier method (condom, diaphragm, cervical cap) on days the woman could become pregnant.  Calendar method. This is keeping track of the length of each menstrual cycle and identifying when you are fertile.  Ovulation method. This is avoiding sexual intercourse during ovulation.  Symptothermal method.  This is avoiding sexual intercourse during ovulation, using a thermometer and ovulation symptoms.  Post-ovulation method. This is timing sexual intercourse after you have ovulated. Regardless of which type or method of contraception you choose, it is important that you use condoms to protect against the transmission of sexually transmitted infections (STIs). Talk with your health care provider about which form of contraception is most appropriate for you. This information is not intended to replace advice given to you by your health care provider. Make sure you discuss any questions you have with your health care provider. Document Released: 05/29/2005 Document Revised: 11/04/2015 Document Reviewed: 11/21/2012 Elsevier Interactive Patient Education  2017 [employer].   Endometrial Ablation Endometrial ablation is a procedure that destroys the thin inner layer of the lining of the uterus (endometrium). This procedure may be done:  To stop heavy periods.  To stop bleeding that is causing anemia.  To control irregular bleeding.  To treat bleeding caused by small tumors (fibroids) in the endometrium. This procedure is often an alternative to major surgery, such as removal of the uterus and cervix (hysterectomy). As a result of this procedure:  You may not be able to have children. However, if you are premenopausal (you have not gone through menopause):  You may still have a small chance of getting pregnant.  You will need to use a reliable method  of birth control after the procedure to prevent pregnancy.  You may stop having a menstrual period, or you may have only a small amount of bleeding during your period. Menstruation may return several years after the procedure. Tell a health care provider about:  Any allergies you have.  All medicines you are taking, including vitamins, herbs, eye drops, creams, and over-the-counter medicines.  Any problems you or family members have had with  the use of anesthetic medicines.  Any blood disorders you have.  Any surgeries you have had.  Any medical conditions you have. What are the risks? Generally, this is a safe procedure. However, problems may occur, including:  A hole (perforation) in the uterus or bowel.  Infection of the uterus, bladder, or vagina.  Bleeding.  Damage to other structures or organs.  An air bubble in the lung (air embolus).  Problems with pregnancy after the procedure.  Failure of the procedure.  Decreased ability to diagnose cancer in the endometrium. What happens before the procedure?  You will have tests of your endometrium to make sure there are no pre-cancerous cells or cancer cells present.  You may have an ultrasound of the uterus.  You may be given medicines to thin the endometrium.  Ask your health care provider about:  Changing or stopping your regular medicines. This is especially important if you take diabetes medicines or blood thinners.  Taking medicines such as aspirin and ibuprofen. These medicines can thin your blood. Do not take these medicines before your procedure if your doctor tells you not to.  Plan to have someone take you home from the hospital or clinic. What happens during the procedure?  You will lie on an exam table with your feet and legs supported as in a pelvic exam.  To lower your risk of infection:  Your health care team will wash or sanitize their hands and put on germ-free (sterile) gloves.  Your genital area will be washed with soap.  An IV tube will be inserted into one of your veins.  You will be given a medicine to help you relax (sedative).  A surgical instrument with a light and camera (resectoscope) will be inserted into your vagina and moved into your uterus. This allows your surgeon to see inside your uterus.  Endometrial tissue will be removed using one of the following methods:  Radiofrequency. This method uses a  radiofrequency-alternating electric current to remove the endometrium.  Cryotherapy. This method uses extreme cold to freeze the endometrium.  Heated-free liquid. This method uses a heated saltwater (saline) solution to remove the endometrium.  Microwave. This method uses high-energy microwaves to heat up the endometrium and remove it.  Thermal balloon. This method involves inserting a catheter with a balloon tip into the uterus. The balloon tip is filled with heated fluid to remove the endometrium. The procedure may vary among health care providers and hospitals. What happens after the procedure?  Your blood pressure, heart rate, breathing rate, and blood oxygen level will be monitored until the medicines you were given have worn off.  As tissue healing occurs, you may notice vaginal bleeding for 4-6 weeks after the procedure. You may also experience:  Cramps.  Thin, watery vaginal discharge that is light pink or brown in color.  A need to urinate more frequently than usual.  Nausea.  Do not drive for 24 hours if you were given a sedative.  Do not have sex or insert anything into your vagina until your health care  provider approves. Summary  Endometrial ablation is done to treat the many causes of heavy menstrual bleeding.  The procedure may be done only after medications have been tried to control the bleeding.  Plan to have someone take you home from the hospital or clinic. This information is not intended to replace advice given to you by your health care provider. Make sure you discuss any questions you have with your health care provider. Document Released: 04/07/2004 Document Revised: 06/15/2016 Document Reviewed: 06/15/2016 Elsevier Interactive Patient Education  2017 [employer].

## 2016-11-07 NOTE — Addendum Note (Signed)
Addended by: Shelly Coss on: 11/07/2016 02:07 PM   Modules accepted: Orders

## 2016-11-07 NOTE — Progress Notes (Signed)
36 yo G2P2 presenting today as an ED follow up for the evaluation of RLQ pain. Patient was treated for PID on 4/29. She reports resolution of her pain but continues to experience irregular bleeding. She describes a normal 7-day period followed by intermittent spotting the rest of the month. She denies any abnormal discharge or pelvic pain. She is sexually active with the same partner using BTL for contraception. She had a recent neg GC/Cl on 4/29  Past Medical History:  Diagnosis Date  . Chlamydia contact, treated   . No pertinent past medical history    Past Surgical History:  Procedure Laterality Date  . TUBAL LIGATION     Family History  Problem Relation Age of Onset  . Hypertension Mother    Social History  Substance Use Topics  . Smoking status: Never Smoker  . Smokeless tobacco: Never Used  . Alcohol use No   ROS See pertinent in hPI Blood pressure 117/75, pulse 71, height 5\' 6"  (1.676 m), weight 147 lb (66.7 kg).  GENERAL: Well-developed, well-nourished female in no acute distress.  HEENT: Normocephalic, atraumatic. Sclerae anicteric.  NECK: Supple. Normal thyroid.  LUNGS: Clear to auscultation bilaterally.  HEART: Regular rate and rhythm. BREASTS: Symmetric in size. No palpable masses or lymphadenopathy, skin changes, or nipple drainage. ABDOMEN: Soft, nontender, nondistended. No organomegaly. PELVIC: Normal external female genitalia. Vagina is pink and rugated.  Normal discharge. Normal appearing cervix. Uterus is 10-weeksl in size. No adnexal mass or tenderness. EXTREMITIES: No cyanosis, clubbing, or edema, 2+ distal pulses.  FINDINGS: Uterus  Measurements: 10.0 x 6.0 x 6.4 cm. A small intramural fibroid is seen in the right posterior fundus measuring 2.3 cm in maximum diameter. This has a shallow submucosal component indenting the endometrium.  Endometrium  Thickness: 13 mm. No focal abnormality visualized, except for small partially submucosal fibroid  described above.  Right ovary  Measurements: 3.6 x 2.1 x 2.6 cm. Normal appearance/no adnexal mass.  Left ovary  Measurements: 4.0 x 1.7 x 2.8 cm. Normal appearance/no adnexal mass.  Pulsed Doppler evaluation of both ovaries demonstrates normal low-resistance arterial and venous waveforms.  Other findings  No abnormal free fluid.  IMPRESSION: Stable 2.3 cm right posterior fundal fibroid, with mild submucosal component.  Normal appearance of both ovaries. No sonographic evidence for ovarian torsion.   Electronically Signed   By: Earle Gell M.D.   On: 10/08/2016 07:51  A/P 36 yo G2P2 with DUB - Discussed the benefits of an endometrial biopsy ENDOMETRIAL BIOPSY     The indications for endometrial biopsy were reviewed.   Risks of the biopsy including cramping, bleeding, infection, uterine perforation, inadequate specimen and need for additional procedures  were discussed. The patient states she understands and agrees to undergo procedure today. Consent was signed. Time out was performed. Urine HCG was negative. A sterile speculum was placed in the patient's vagina and the cervix was prepped with Betadine. A single-toothed tenaculum was placed on the anterior lip of the cervix to stabilize it. The uterine cavity was sounded to a depth of 9 cm using the uterine sound. The 3 mm pipelle was introduced into the endometrial cavity without difficulty, 2 passes were made.  A  moderate amount of tissue was  sent to pathology. The instruments were removed from the patient's vagina. Minimal bleeding from the cervix was noted. The patient tolerated the procedure well.  Routine post-procedure instructions were given to the patient. The patient will follow up in two weeks to review the results  and for further management.   - Discussed medical management with contraception and surgical management with endometrial ablation pending biopsy results. Information was provided on both options.   - Rx provera provided - Patient will be contacted with abnormal results

## 2016-11-07 NOTE — Addendum Note (Signed)
Addended by: Mora Bellman on: 11/07/2016 02:01 PM   Modules accepted: Orders

## 2016-11-10 ENCOUNTER — Telehealth: Payer: Self-pay | Admitting: *Deleted

## 2016-11-10 NOTE — Telephone Encounter (Signed)
Called patient per message from Dr Elly Modena and notified her that her endometrial biopsy was negative and she should manage her bleeding with options as discussed in the office. She states she is still bleeding , provera not really helping. Would like to try other medication or bcp.  Per Dr. Elly Modena note needs to be seen in office to discuss. Will send message to Dr. Elly Modena to verify- and will have front office to schedule appt. And call patient.

## 2016-11-15 ENCOUNTER — Ambulatory Visit (INDEPENDENT_AMBULATORY_CARE_PROVIDER_SITE_OTHER): Payer: Self-pay | Admitting: Obstetrics and Gynecology

## 2016-11-15 ENCOUNTER — Encounter: Payer: Self-pay | Admitting: Obstetrics and Gynecology

## 2016-11-15 VITALS — BP 118/80 | HR 79 | Wt 147.6 lb

## 2016-11-15 DIAGNOSIS — N938 Other specified abnormal uterine and vaginal bleeding: Secondary | ICD-10-CM

## 2016-11-15 MED ORDER — NORETHINDRONE-ETH ESTRADIOL 0.4-35 MG-MCG PO TABS: ORAL_TABLET | ORAL | 0 refills | Status: DC

## 2016-11-15 MED ORDER — NORGESTIMATE-ETH ESTRADIOL 0.25-35 MG-MCG PO TABS: 1.0000 | ORAL_TABLET | Freq: Every day | ORAL | 11 refills | Status: DC

## 2016-11-15 MED ORDER — NORETHINDRONE-ETH ESTRADIOL 0.4-35 MG-MCG PO TABS
ORAL_TABLET | ORAL | 0 refills | Status: DC
Start: 2016-11-15 — End: 2016-11-15

## 2016-11-15 NOTE — Progress Notes (Signed)
36 yo G2P2 here reporting worsening heavy vaginal bleeding since starting provera. She is interested in a different medical options in controlling her DUB. She reports irregular bleeding with depo-provera  Past Medical History:  Diagnosis Date  . Chlamydia contact, treated   . No pertinent past medical history    Past Surgical History:  Procedure Laterality Date  . TUBAL LIGATION     Family History  Problem Relation Age of Onset  . Hypertension Mother    Social History  Substance Use Topics  . Smoking status: Never Smoker  . Smokeless tobacco: Never Used  . Alcohol use No   ROS See pertinent in HPI  Blood pressure 118/80, pulse 79, weight 147 lb 9.6 oz (67 kg). GENERAL: Well-developed, well-nourished female in no acute distress.  NEURO: alert and oriented x 3  A/P 36 yo with DUB - Discussed various contraception options. Patient opted for COC.  - Taper dose provided followed by Rx Sprintec. Patient instructed to try birthcontrol for 3 months - Discussed that endometrial ablation would be another option if COC failed - Ultrasound and biopsy results were reviewed with the patient - RTC prn

## 2016-12-20 ENCOUNTER — Emergency Department (HOSPITAL_COMMUNITY): Payer: Self-pay

## 2016-12-20 ENCOUNTER — Encounter (HOSPITAL_COMMUNITY): Payer: Self-pay | Admitting: Emergency Medicine

## 2016-12-20 ENCOUNTER — Emergency Department (HOSPITAL_COMMUNITY)
Admission: EM | Admit: 2016-12-20 | Discharge: 2016-12-21 | Disposition: A | Discharge status: Home / Self Care | Payer: Self-pay | Attending: Emergency Medicine | Admitting: Emergency Medicine

## 2016-12-20 DIAGNOSIS — R109 Unspecified abdominal pain: Secondary | ICD-10-CM | POA: Insufficient documentation

## 2016-12-20 DIAGNOSIS — N3001 Acute cystitis with hematuria: Secondary | ICD-10-CM | POA: Insufficient documentation

## 2016-12-20 DIAGNOSIS — N938 Other specified abnormal uterine and vaginal bleeding: Secondary | ICD-10-CM | POA: Insufficient documentation

## 2016-12-20 DIAGNOSIS — R1030 Lower abdominal pain, unspecified: Secondary | ICD-10-CM | POA: Insufficient documentation

## 2016-12-20 LAB — URINALYSIS, ROUTINE W REFLEX MICROSCOPIC
Bacteria, UA: NONE SEEN
Bilirubin Urine: NEGATIVE
Glucose, UA: NEGATIVE mg/dL
Ketones, ur: NEGATIVE mg/dL
Nitrite: NEGATIVE
Protein, ur: NEGATIVE mg/dL
Specific Gravity, Urine: 1.02 (ref 1.005–1.030)
pH: 6 (ref 5.0–8.0)

## 2016-12-20 LAB — COMPREHENSIVE METABOLIC PANEL
ALT: 10 U/L — ABNORMAL LOW (ref 14–54)
AST: 15 U/L (ref 15–41)
Albumin: 4.1 g/dL (ref 3.5–5.0)
Alkaline Phosphatase: 74 U/L (ref 38–126)
Anion gap: 7 (ref 5–15)
BUN: 11 mg/dL (ref 6–20)
CO2: 26 mmol/L (ref 22–32)
Calcium: 9 mg/dL (ref 8.9–10.3)
Chloride: 104 mmol/L (ref 101–111)
Creatinine, Ser: 0.81 mg/dL (ref 0.44–1.00)
GFR calc Af Amer: >60 mL/min (ref >60)
GFR calc non Af Amer: >60 mL/min (ref >60)
Glucose, Bld: 80 mg/dL (ref 65–99)
Potassium: 3.5 mmol/L (ref 3.5–5.1)
Sodium: 137 mmol/L (ref 135–145)
Total Bilirubin: 0.7 mg/dL (ref 0.3–1.2)
Total Protein: 7.7 g/dL (ref 6.5–8.1)

## 2016-12-20 LAB — CBC
HCT: 37.2 % (ref 36.0–46.0)
Hemoglobin: 12.8 g/dL (ref 12.0–15.0)
MCH: 28.6 pg (ref 26.0–34.0)
MCHC: 34.4 g/dL (ref 30.0–36.0)
MCV: 83 fL (ref 78.0–100.0)
Platelets: 215 10*3/uL (ref 150–400)
RBC: 4.48 MIL/uL (ref 3.87–5.11)
RDW: 13.7 % (ref 11.5–15.5)
WBC: 8.1 10*3/uL (ref 4.0–10.5)

## 2016-12-20 LAB — WET PREP, GENITAL
Sperm: NONE SEEN
Yeast Wet Prep HPF POC: NONE SEEN

## 2016-12-20 LAB — I-STAT BETA HCG BLOOD, ED (MC, WL, AP ONLY): I-stat hCG, quantitative: <5 m[IU]/mL (ref <5)

## 2016-12-20 LAB — LIPASE, BLOOD: Lipase: 34 U/L (ref 11–51)

## 2016-12-20 MED ORDER — ACETAMINOPHEN 500 MG PO TABS
1000.0000 mg | ORAL_TABLET | Freq: Once | ORAL | Status: AC
  Administered 2016-12-20: 1000 mg via ORAL
  Filled 2016-12-20: qty 2

## 2016-12-20 MED ORDER — SODIUM CHLORIDE 0.9 % IV BOLUS (SEPSIS)
1000.0000 mL | Freq: Once | INTRAVENOUS | Status: AC
  Administered 2016-12-20: 1000 mL via INTRAVENOUS

## 2016-12-20 MED ORDER — HYDROCODONE-ACETAMINOPHEN 5-325 MG PO TABS: 2.0000 | ORAL_TABLET | ORAL | 0 refills | Status: DC | PRN

## 2016-12-20 MED ORDER — CEPHALEXIN 500 MG PO CAPS: 500.0000 mg | ORAL_CAPSULE | Freq: Four times a day (QID) | ORAL | 0 refills | Status: DC

## 2016-12-20 MED ORDER — KETOROLAC TROMETHAMINE 30 MG/ML IJ SOLN
30.0000 mg | Freq: Once | INTRAMUSCULAR | Status: AC
  Administered 2016-12-20: 30 mg via INTRAVENOUS
  Filled 2016-12-20: qty 1

## 2016-12-20 NOTE — ED Triage Notes (Signed)
Pt c/o bilateral flank pain and lower abdominal pain, nausea, hematuria, chills, oliguria, urinary frequency, white discharge seen in toilet onset 1 week ago. No emesis.

## 2016-12-20 NOTE — Discharge Instructions (Signed)
You can take Tylenol or Ibuprofen as directed for pain. Take pain medication for break through pain.   Take antibiotics as prescribed.   Make sure you drinking plenty of fluids.   Follow-up with your [facility] doctor in the next 24-48 hours for further evaluation.   Return to the Emergency Department for any worsening pain, fever, nausea/vomiting, dizziness, chest pain, shortness or breath or any other worsening or concerning symtoms.

## 2016-12-20 NOTE — ED Provider Notes (Signed)
[facility] DEPT Provider Note   CSN: [id number] Arrival date & time: 12/20/16  1720     History   Chief Complaint Chief Complaint  Patient presents with  . Flank Pain  . Abdominal Pain    HPI Laurie Bell is a 36 y.o. female reports 1 week of bilateral flank pain, left greater than right and lower abdominal pain. She describes pain as a "crampy, ache" in states that it is worse with movement. She denies any alleviating factors. She has not taken any other medication. She reports she has had some associated nausea, decreased appetite, hematuria, chills. She also reports that she is experiencing some urinary frequency and decreased urine output. Patient did note some white vaginal discharge one week ago but none since. She is experiencing some vaginal bleeding. She reports that this has been an intermittent problem for the last few months. She follows-up with the [facility] and had a recent U/S that showed a 2.3 cm fibroid and was diagnosed with dysfunctional uterine bleeding. She had been started on Depo Provera but had not had any improvement. She was seen by her OB/GYN on 11/17/16 for follow-up and was transitioned to [place], which patient states has not provided much improvement.  Patient reports her last bowel movement was yesterday and was normal. No presence of blood. Patient reports that she has had a tubal ligation. Patient has a history of kidney stones and pyelonephritis. Patient denies any fever, chest pain, difficulty breathing.   The history is provided by the patient.    Past Medical History:  Diagnosis Date  . Chlamydia contact, treated   . No pertinent past medical history     There are no active problems to display for this patient.   Past Surgical History:  Procedure Laterality Date  . TUBAL LIGATION      OB History    Gravida Para Term Preterm AB Living   2 2 2  0 0 2   SAB TAB Ectopic Multiple Live Births   0 0 0 0         Home  Medications    Prior to Admission medications   Medication Sig Start Date End Date Taking? Authorizing Provider  Chlorphen-PE-Acetaminophen (TYLENOL SINUS CONGESTION/PAIN PO) Take 2 tablets by mouth 2 (two) times daily.   Yes [provider]  cephALEXin (KEFLEX) 500 MG capsule Take 1 capsule (500 mg total) by mouth 4 (four) times daily. 12/20/16   Volanda Napoleon, PA-C  HYDROcodone-acetaminophen (NORCO/VICODIN) 5-325 MG tablet Take 2 tablets by mouth every 4 (four) hours as needed. 12/20/16   Volanda Napoleon, PA-C  medroxyPROGESTERone (PROVERA) 10 MG tablet Take 2 tablets (20 mg total) by mouth daily. Patient not taking: Reported on 12/20/2016 11/07/16   Constant, Peggy, MD  norethindrone-ethinyl estradiol (OVCON-35, 28,) 0.4-35 MG-MCG tablet Take 3 pills per day for 3 days, 2 pills per day for 3 days, 1 pill daily skipping placebo pills Patient not taking: Reported on 12/20/2016 11/15/16   Constant, Peggy, MD  norgestimate-ethinyl estradiol (ORTHO-CYCLEN,SPRINTEC,PREVIFEM) 0.25-35 MG-MCG tablet Take 1 tablet by mouth daily. Patient not taking: Reported on 12/20/2016 11/15/16   Constant, Peggy, MD    Family History Family History  Problem Relation Age of Onset  . Hypertension Mother     Social History Social History  Substance Use Topics  . Smoking status: Never Smoker  . Smokeless tobacco: Never Used  . Alcohol use No     Allergies   Patient has no known allergies.  Review of Systems Review of Systems  Constitutional: Positive for appetite change and chills. Negative for fever.  Respiratory: Negative for cough and shortness of breath.   Cardiovascular: Negative for chest pain.  Gastrointestinal: Positive for abdominal pain and nausea. Negative for blood in stool, diarrhea and vomiting.  Genitourinary: Positive for dysuria, flank pain, frequency, hematuria, vaginal bleeding and vaginal discharge.  Musculoskeletal: Negative for back pain and neck pain.  Skin: Negative for  rash.     Physical Exam Updated Vital Signs BP 100/80 (BP Location: Right Arm)   Pulse 74   Temp 98.8 F (37.1 C) (Oral)   Resp 20   Ht 5\' 6"  (1.676 m)   Wt 63.5 kg (140 lb)   SpO2 100%   BMI 22.60 kg/m   Physical Exam  Constitutional: She is oriented to person, place, and time. She appears well-developed and well-nourished.  Appears uncomfortable but no acute distress  HENT:  Head: Normocephalic and atraumatic.  Mouth/Throat: Oropharynx is clear and moist. Mucous membranes are dry.  Eyes: Conjunctivae, EOM and lids are normal. Pupils are equal, round, and reactive to light.  Neck: Full passive range of motion without pain.  Cardiovascular: Normal rate, regular rhythm, normal heart sounds and normal pulses.  Exam reveals no gallop and no friction rub.   No murmur heard. Pulmonary/Chest: Effort normal and breath sounds normal.  Abdominal: Soft. Normal appearance and bowel sounds are normal. She exhibits no distension. There is tenderness. There is CVA tenderness. There is no rigidity and no guarding.  Abdomen is soft, nondistended. Diffuse abdominal tenderness to the left lateral abdomen that extends the left lower quadrant and across the abdomen bilaterally. No McBurney's point tenderness. Bilateral CVA tenderness, right greater than left.  Genitourinary: Vagina normal and uterus normal. Cervix exhibits no motion tenderness and no friability. Right adnexum displays no mass and no tenderness. Left adnexum displays no mass and no tenderness.  Genitourinary Comments: The exam was performed with a chaperone present. Normal external female genitalia. No external lesions. Cervix with no friability. No CMT tenderness. No adnexal mass or tenderness. No uterine tenderness. Presence of blood in the vaginal canal.   Musculoskeletal: Normal range of motion.  Neurological: She is alert and oriented to person, place, and time.  Skin: Skin is warm and dry. Capillary refill takes less than 2  seconds.  Psychiatric: She has a normal mood and affect. Her speech is normal.  Nursing note and vitals reviewed.    ED Treatments / Results  Labs (all labs ordered are listed, but only abnormal results are displayed) Labs Reviewed  WET PREP, GENITAL - Abnormal; Notable for the following:       Result Value   Trich, Wet Prep PRESENT (*)    Clue Cells Wet Prep HPF POC PRESENT (*)    WBC, Wet Prep HPF POC MANY (*)    All other components within normal limits  COMPREHENSIVE METABOLIC PANEL - Abnormal; Notable for the following:    ALT 10 (*)    All other components within normal limits  URINALYSIS, ROUTINE W REFLEX MICROSCOPIC - Abnormal; Notable for the following:    APPearance HAZY (*)    Hgb urine dipstick MODERATE (*)    Leukocytes, UA LARGE (*)    Squamous Epithelial / LPF 6-30 (*)    All other components within normal limits  LIPASE, BLOOD  CBC  I-STAT BETA HCG BLOOD, ED ([facility], [facility], [facility] ONLY)  GC/CHLAMYDIA PROBE AMP ([place]) NOT AT [facility]  EKG  EKG Interpretation None       Radiology Ct Renal Stone Study  Result Date: 12/20/2016 CLINICAL DATA:  36 year old female with lower abdominal/ flank pain and hematuria. EXAM: CT ABDOMEN AND PELVIS WITHOUT CONTRAST TECHNIQUE: Multidetector CT imaging of the abdomen and pelvis was performed following the standard protocol without IV contrast. COMPARISON:  Pelvic ultrasound dated 10/08/2016 and CT of the abdomen pelvis dated 08/02/2010 FINDINGS: Evaluation of this exam is limited in the absence of intravenous contrast. Lower chest: The visualized lung bases are clear. No intra-abdominal free air.  Small free fluid within the pelvis. Hepatobiliary: No focal liver abnormality is seen. No gallstones, gallbladder wall thickening, or biliary dilatation. Pancreas: Unremarkable. No pancreatic ductal dilatation or surrounding inflammatory changes. Spleen: Normal in size without focal abnormality. Adrenals/Urinary Tract: Adrenal glands are  unremarkable. Kidneys are normal, without renal calculi, focal lesion, or hydronephrosis. Bladder is unremarkable. Stomach/Bowel: There is moderate stool within the colon. No evidence of bowel obstruction or active inflammation. Normal appendix. Vascular/Lymphatic: The abdominal aorta and IVC are grossly unremarkable on this noncontrast study. There is a retroaortic left renal vein anatomy. No portal venous gas noted. There is no adenopathy. Reproductive: The uterus is anteverted appears somewhat bulky. A faintly seen density in the right fundus likely corresponds to the fibroid seen on the ultrasound. The ovaries are grossly unremarkable. Other: There is diastases of anterior abdominal wall musculature at the level of the umbilicus. Musculoskeletal: No acute or significant osseous findings. IMPRESSION: 1. No acute intra-abdominal or pelvic pathology. Specifically there is no hydronephrosis or nephrolithiasis. 2. Moderate colonic stool burden. No bowel obstruction or active inflammation. Normal appendix. 3. Prominent uterus with poorly visualized fibroid. Electronically Signed   By: Anner Crete M.D.   On: 12/20/2016 22:44    Procedures Procedures (including critical care time)  Medications Ordered in ED Medications  sodium chloride 0.9 % bolus 1,000 mL (0 mLs Intravenous Stopped 12/21/16 0027)  ketorolac (TORADOL) 30 MG/ML injection 30 mg (30 mg Intravenous Given 12/20/16 2223)  acetaminophen (TYLENOL) tablet 1,000 mg (1,000 mg Oral Given 12/20/16 2334)     Initial Impression / Assessment and Plan / ED Course  I have reviewed the triage vital signs and the nursing notes.  Pertinent labs & imaging results that were available during my care of the patient were reviewed by me and considered in my medical decision making (see chart for details).     36 year old female who presents with 1 week of bilateral flank pain, urinary frequency, hematuria, nausea. Patient is afebrile, non-toxic appearing,  sitting comfortably on examination table. Vital signs reviewed and stable. Consider UTI versus kidney stone vs pyelonephritis versus acute infectious etiology. Also consider continuation of uterine fibroid and dysfunctional uterine bleeding vs endometriosis. Given duration of symptoms and history/physical exam do not consider ovarian torsion. Initial labs ordered at triage, including using a, CBC, CMP, i-STAT beta. Will provide analgesics in the department. IVF given for fluid resuscitation.  Labs reviewed. I-STAT beta is negative. CMP unremarkable. Lipase is unremarkable. CBC unremarkable. UA shows hemoglobin, leukocytes, pyuria. Discussed results with patient. Given patient's history, CVA pain and hgb on UA will obtain CT renal stone scan for evaluation of kidney stone. Patient reports mild improvement in pain after pain medication. Patient drove herself here there will hold on any sedative medications. Additional analgesics given.   CT renal stone scan reviewed. Negative for presence of kidney stone or hydronephrosis. Discussed results with patient.   Pelvic as documented above. No evidence  of PID. She does have some vaginal bleeding. Suspect this is a result of patient's dysfunctional uterine bleeding. Given that patient has known history of uterine fibroids and dysfunctional uterine bleeding and patient's current presentation, do not need a repeat U/S at this time. Discussed results with patient. She is scheduled to see her OB/GYN next week for re-evaluation but states that she will call them tomorrow to arrange for an earlier appointment. Will provide short course of pain medication until patient is able to be seen by her OB/GYN. Will plan to treat for UTI given UA findings and since patient is symptomatic. Patient instructed that she will be contacted for any abnormal results on pending pelvic swab samples. Instructed patient to follow-up with OB/GYN in 2 days. Return precautions discussed. Patient  expresses understanding and agreement to plan.     Final Clinical Impressions(s) / ED Diagnoses   Final diagnoses:  Flank pain  Lower abdominal pain  Dysfunctional uterine bleeding  Acute cystitis with hematuria     New Prescriptions Discharge Medication List as of 12/20/2016 11:49 PM    START taking these medications   Details  cephALEXin (KEFLEX) 500 MG capsule Take 1 capsule (500 mg total) by mouth 4 (four) times daily., Starting Wed 12/20/2016, Print    HYDROcodone-acetaminophen (NORCO/VICODIN) 5-325 MG tablet Take 2 tablets by mouth every 4 (four) hours as needed., Starting Wed 12/20/2016, Print         Desma Mcgregor 12/21/16 2137    Lacretia Leigh, MD 12/22/16 2259

## 2016-12-21 ENCOUNTER — Telehealth: Payer: Self-pay | Admitting: Obstetrics & Gynecology

## 2016-12-21 LAB — GC/CHLAMYDIA PROBE AMP (~~LOC~~) NOT AT ARMC
Chlamydia: NEGATIVE
Neisseria Gonorrhea: NEGATIVE

## 2016-12-21 NOTE — Telephone Encounter (Signed)
Patient would like a Call she said she's having a lot of bleeding I made her an appt aor 8-3 that was our first appt

## 2017-01-12 ENCOUNTER — Encounter: Payer: Self-pay | Admitting: Obstetrics & Gynecology

## 2017-01-12 ENCOUNTER — Ambulatory Visit (INDEPENDENT_AMBULATORY_CARE_PROVIDER_SITE_OTHER): Payer: Self-pay | Admitting: Obstetrics & Gynecology

## 2017-01-12 DIAGNOSIS — D259 Leiomyoma of uterus, unspecified: Secondary | ICD-10-CM | POA: Insufficient documentation

## 2017-01-12 DIAGNOSIS — N938 Other specified abnormal uterine and vaginal bleeding: Secondary | ICD-10-CM

## 2017-01-12 MED ORDER — LEUPROLIDE ACETATE (3 MONTH) 11.25 MG IM KIT: 11.2500 mg | PACK | Freq: Once | INTRAMUSCULAR | Status: DC

## 2017-01-12 MED ORDER — MEGESTROL ACETATE 40 MG PO TABS: 40.0000 mg | ORAL_TABLET | Freq: Two times a day (BID) | ORAL | 3 refills | Status: DC

## 2017-01-12 NOTE — Progress Notes (Signed)
Patient ID: Laurie Bell, female   DOB: Apr 19, 1981, 36 y.o.   MRN: [id number]  Chief Complaint  Patient presents with  . Gynecologic Exam  DUB daily  HPI Laurie Bell is a 36 y.o. female.  [id number] No LMP recorded. She bleeds daily with heavy flow despite hormone therapy. S/P BTL HPI  Past Medical History:  Diagnosis Date  . Chlamydia contact, treated   . No pertinent past medical history     Past Surgical History:  Procedure Laterality Date  . TUBAL LIGATION      Family History  Problem Relation Age of Onset  . Hypertension Mother     Social History Social History  Substance Use Topics  . Smoking status: Never Smoker  . Smokeless tobacco: Never Used  . Alcohol use No    No Known Allergies  Current Outpatient Prescriptions  Medication Sig Dispense Refill  . cephALEXin (KEFLEX) 500 MG capsule Take 1 capsule (500 mg total) by mouth 4 (four) times daily. 28 capsule 0  . Chlorphen-PE-Acetaminophen (TYLENOL SINUS CONGESTION/PAIN PO) Take 2 tablets by mouth 2 (two) times daily.    Marland Kitchen HYDROcodone-acetaminophen (NORCO/VICODIN) 5-325 MG tablet Take 2 tablets by mouth every 4 (four) hours as needed. 5 tablet 0  . megestrol (MEGACE) 40 MG tablet Take 1 tablet (40 mg total) by mouth 2 (two) times daily. 30 tablet 3   Current Facility-Administered Medications  Medication Dose Route Frequency Provider Last Rate Last Dose  . leuprolide (LUPRON) injection 11.25 mg  11.25 mg Intramuscular Once Woodroe Mode, MD        Review of Systems Review of Systems  Constitutional: Positive for appetite change and unexpected weight change (weight loss).  Respiratory: Negative.   Gastrointestinal: Negative.   Genitourinary: Positive for menstrual problem, pelvic pain (cramps) and vaginal bleeding.  Psychiatric/Behavioral: Positive for sleep disturbance.    Blood pressure 110/78, pulse 75, height 5\' 6"  (1.676 m), weight 152 lb 8 oz (69.2 kg).  Physical Exam Physical Exam   Constitutional: She is oriented to person, place, and time. She appears well-developed. No distress.  Cardiovascular: Normal rate.   Pulmonary/Chest: Effort normal.  Neurological: She is alert and oriented to person, place, and time.  Psychiatric: She has a normal mood and affect. Her behavior is normal.  Vitals reviewed.   Data Reviewed CLINICAL DATA:  Pelvic pain, right side greater than left, for 2 weeks. Vaginal bleeding for 1 month.  EXAM: TRANSABDOMINAL AND TRANSVAGINAL ULTRASOUND OF PELVIS  DOPPLER ULTRASOUND OF OVARIES  TECHNIQUE: Both transabdominal and transvaginal ultrasound examinations of the pelvis were performed. Transabdominal technique was performed for global imaging of the pelvis including uterus, ovaries, adnexal regions, and pelvic cul-de-sac.  It was necessary to proceed with endovaginal exam following the transabdominal exam to visualize the small fibroids. Color and duplex Doppler ultrasound was utilized to evaluate blood flow to the ovaries.  COMPARISON:  11/03/2015  FINDINGS: Uterus  Measurements: 10.0 x 6.0 x 6.4 cm. A small intramural fibroid is seen in the right posterior fundus measuring 2.3 cm in maximum diameter. This has a shallow submucosal component indenting the endometrium.  Endometrium  Thickness: 13 mm. No focal abnormality visualized, except for small partially submucosal fibroid described above.  Right ovary  Measurements: 3.6 x 2.1 x 2.6 cm. Normal appearance/no adnexal mass.  Left ovary  Measurements: 4.0 x 1.7 x 2.8 cm. Normal appearance/no adnexal mass.  Pulsed Doppler evaluation of both ovaries demonstrates normal low-resistance arterial and venous waveforms.  Other findings  No abnormal free fluid.  IMPRESSION: Stable 2.3 cm right posterior fundal fibroid, with mild submucosal component.  Normal appearance of both ovaries. No sonographic evidence for ovarian torsion.   Electronically  Signed   By: Earle Gell M.D.   On: 10/08/2016 07:51 CBC    Component Value Date/Time   WBC 8.1 12/20/2016 1834   RBC 4.48 12/20/2016 1834   HGB 12.8 12/20/2016 1834   HCT 37.2 12/20/2016 1834   PLT 215 12/20/2016 1834   MCV 83.0 12/20/2016 1834   MCH 28.6 12/20/2016 1834   MCHC 34.4 12/20/2016 1834   RDW 13.7 12/20/2016 1834   LYMPHSABS 2.8 07/24/2015 2027   MONOABS 0.6 07/24/2015 2027   EOSABS 0.2 07/24/2015 2027   BASOSABS 0.0 07/24/2015 2027    Assessment    Patient Active Problem List   Diagnosis Date Noted  . DUB (dysfunctional uterine bleeding) 01/12/2017  . Fibroid, uterine 01/12/2017       Plan    Lupron 11.25 mg IM when available Change to Megace and d/c OCP sonhysterogram ordered and f/u in 4 weeks       Laurie Bell 01/12/2017, 9:55 AM

## 2017-01-12 NOTE — Patient Instructions (Signed)
Uterine Fibroids Uterine fibroids are tissue masses (tumors) that can develop in the womb (uterus). They are also called leiomyomas. This type of tumor is not cancerous (benign) and does not spread to other parts of the body outside of the pelvic area, which is between the hip bones. Occasionally, fibroids may develop in the fallopian tubes, in the cervix, or on the support structures (ligaments) that surround the uterus. You can have one or many fibroids. Fibroids can vary in size, weight, and where they grow in the uterus. Some can become quite large. Most fibroids do not require medical treatment. What are the causes? A fibroid can develop when a single uterine cell keeps growing (replicating). Most cells in the human body have a control mechanism that keeps them from replicating without control. What are the signs or symptoms? Symptoms may include:  Heavy bleeding during your period.  Bleeding or spotting between periods.  Pelvic pain and pressure.  Bladder problems, such as needing to urinate more often (urinary frequency) or urgently.  Inability to reproduce offspring (infertility).  Miscarriages.  How is this diagnosed? Uterine fibroids are diagnosed through a physical exam. Your health care provider may feel the lumpy tumors during a pelvic exam. Ultrasonography and an MRI may be done to determine the size, location, and number of fibroids. How is this treated? Treatment may include:  Watchful waiting. This involves getting the fibroid checked by your health care provider to see if it grows or shrinks. Follow your health care provider's recommendations for how often to have this checked.  Hormone medicines. These can be taken by mouth or given through an intrauterine device (IUD).  Surgery. ? Removing the fibroids (myomectomy) or the uterus (hysterectomy). ? Removing blood supply to the fibroids (uterine artery embolization).  If fibroids interfere with your fertility and you  want to become pregnant, your health care provider may recommend having the fibroids removed. Follow these instructions at home:  Keep all follow-up visits as directed by your health care provider. This is important.  Take over-the-counter and prescription medicines only as told by your health care provider. ? If you were prescribed a hormone treatment, take the hormone medicines exactly as directed.  Ask your health care provider about taking iron pills and increasing the amount of dark green, leafy vegetables in your diet. These actions can help to boost your blood iron levels, which may be affected by heavy menstrual bleeding.  Pay close attention to your period and tell your health care provider about any changes, such as: ? Increased blood flow that requires you to use more pads or tampons than usual per month. ? A change in the number of days that your period lasts per month. ? A change in symptoms that are associated with your period, such as abdominal cramping or back pain. Contact a health care provider if:  You have pelvic pain, back pain, or abdominal cramps that cannot be controlled with medicines.  You have an increase in bleeding between and during periods.  You soak tampons or pads in a half hour or less.  You feel lightheaded, extra tired, or weak. Get help right away if:  You faint.  You have a sudden increase in pelvic pain. This information is not intended to replace advice given to you by your health care provider. Make sure you discuss any questions you have with your health care provider. Document Released: 05/26/2000 Document Revised: 01/27/2016 Document Reviewed: 11/25/2013 Elsevier Interactive Patient Education  2018 [employer]  

## 2017-01-12 NOTE — Progress Notes (Signed)
Sonohysterogram [place] scheduled for August 8th @ 0830. Pt notified.  Pt given Depo Lupron application and advised to bring back with income information.  I informed pt that we will not receive Lupron without her turning in the information required.  Pt stated understanding.

## 2017-01-17 ENCOUNTER — Ambulatory Visit (HOSPITAL_COMMUNITY)
Admission: RE | Admit: 2017-01-17 | Discharge: 2017-01-17 | Disposition: A | Discharge status: Home / Self Care | Payer: Self-pay | Source: Ambulatory Visit | Attending: Obstetrics & Gynecology | Admitting: Obstetrics & Gynecology

## 2017-01-17 DIAGNOSIS — N840 Polyp of corpus uteri: Secondary | ICD-10-CM | POA: Insufficient documentation

## 2017-01-17 DIAGNOSIS — D250 Submucous leiomyoma of uterus: Secondary | ICD-10-CM | POA: Insufficient documentation

## 2017-01-17 DIAGNOSIS — N938 Other specified abnormal uterine and vaginal bleeding: Secondary | ICD-10-CM | POA: Insufficient documentation

## 2017-02-08 ENCOUNTER — Ambulatory Visit: Payer: Self-pay | Admitting: Obstetrics & Gynecology

## 2017-02-08 NOTE — Progress Notes (Addendum)
Per Dr. Roselie Awkward pt does not need to be contacted in regards to missed appointment.

## 2017-02-26 ENCOUNTER — Ambulatory Visit: Payer: Self-pay | Admitting: Obstetrics & Gynecology

## 2017-03-15 ENCOUNTER — Ambulatory Visit (INDEPENDENT_AMBULATORY_CARE_PROVIDER_SITE_OTHER): Payer: Self-pay | Admitting: Obstetrics & Gynecology

## 2017-03-15 DIAGNOSIS — N938 Other specified abnormal uterine and vaginal bleeding: Secondary | ICD-10-CM

## 2017-03-15 MED ORDER — MEGESTROL ACETATE 40 MG PO TABS: 40.0000 mg | ORAL_TABLET | Freq: Two times a day (BID) | ORAL | 3 refills | Status: DC

## 2017-03-15 NOTE — Patient Instructions (Signed)
Endometrial Ablation Endometrial ablation is a procedure that destroys the thin inner layer of the lining of the uterus (endometrium). This procedure may be done:  To stop heavy periods.  To stop bleeding that is causing anemia.  To control irregular bleeding.  To treat bleeding caused by small tumors (fibroids) in the endometrium.  This procedure is often an alternative to major surgery, such as removal of the uterus and cervix (hysterectomy). As a result of this procedure:  You may not be able to have children. However, if you are premenopausal (you have not gone through menopause): ? You may still have a small chance of getting pregnant. ? You will need to use a reliable method of birth control after the procedure to prevent pregnancy.  You may stop having a menstrual period, or you may have only a small amount of bleeding during your period. Menstruation may return several years after the procedure.  Tell a health care provider about:  Any allergies you have.  All medicines you are taking, including vitamins, herbs, eye drops, creams, and over-the-counter medicines.  Any problems you or family members have had with the use of anesthetic medicines.  Any blood disorders you have.  Any surgeries you have had.  Any medical conditions you have. What are the risks? Generally, this is a safe procedure. However, problems may occur, including:  A hole (perforation) in the uterus or bowel.  Infection of the uterus, bladder, or vagina.  Bleeding.  Damage to other structures or organs.  An air bubble in the lung (air embolus).  Problems with pregnancy after the procedure.  Failure of the procedure.  Decreased ability to diagnose cancer in the endometrium.  What happens before the procedure?  You will have tests of your endometrium to make sure there are no pre-cancerous cells or cancer cells present.  You may have an ultrasound of the uterus.  You may be given  medicines to thin the endometrium.  Ask your health care provider about: ? Changing or stopping your regular medicines. This is especially important if you take diabetes medicines or blood thinners. ? Taking medicines such as aspirin and ibuprofen. These medicines can thin your blood. Do not take these medicines before your procedure if your doctor tells you not to.  Plan to have someone take you home from the hospital or clinic. What happens during the procedure?  You will lie on an exam table with your feet and legs supported as in a pelvic exam.  To lower your risk of infection: ? Your health care team will wash or sanitize their hands and put on germ-free (sterile) gloves. ? Your genital area will be washed with soap.  An IV tube will be inserted into one of your veins.  You will be given a medicine to help you relax (sedative).  A surgical instrument with a light and camera (resectoscope) will be inserted into your vagina and moved into your uterus. This allows your surgeon to see inside your uterus.  Endometrial tissue will be removed using one of the following methods: ? Radiofrequency. This method uses a radiofrequency-alternating electric current to remove the endometrium. ? Cryotherapy. This method uses extreme cold to freeze the endometrium. ? Heated-free liquid. This method uses a heated saltwater (saline) solution to remove the endometrium. ? Microwave. This method uses high-energy microwaves to heat up the endometrium and remove it. ? Thermal balloon. This method involves inserting a catheter with a balloon tip into the uterus. The balloon tip is   filled with heated fluid to remove the endometrium. The procedure may vary among health care providers and hospitals. What happens after the procedure?  Your blood pressure, heart rate, breathing rate, and blood oxygen level will be monitored until the medicines you were given have worn off.  As tissue healing occurs, you may  notice vaginal bleeding for 4-6 weeks after the procedure. You may also experience: ? Cramps. ? Thin, watery vaginal discharge that is light pink or brown in color. ? A need to urinate more frequently than usual. ? Nausea.  Do not drive for 24 hours if you were given a sedative.  Do not have sex or insert anything into your vagina until your health care provider approves. Summary  Endometrial ablation is done to treat the many causes of heavy menstrual bleeding.  The procedure may be done only after medications have been tried to control the bleeding.  Plan to have someone take you home from the hospital or clinic. This information is not intended to replace advice given to you by your health care provider. Make sure you discuss any questions you have with your health care provider. Document Released: 04/07/2004 Document Revised: 06/15/2016 Document Reviewed: 06/15/2016 Elsevier Interactive Patient Education  2017 [employer]  

## 2017-03-15 NOTE — Progress Notes (Signed)
Here for follow up , states was bleeding and hurting yesterday, today only bleeding when wipes.

## 2017-03-15 NOTE — Progress Notes (Signed)
Patient ID: Laurie Bell, female   DOB: 19-Jan-1981, 36 y.o.   MRN: [id number]  Chief Complaint  Patient presents with  . Follow-up    HPI Laurie Bell is a 36 y.o. female.  [id number] No LMP recorded. No vaginal bleeding currently after Lupron and megace. She has SHG and wants to discuss endometrial ablation. Nexplanon and Mirena offered and she declines.  HPI  Past Medical History:  Diagnosis Date  . Chlamydia contact, treated   . No pertinent past medical history     Past Surgical History:  Procedure Laterality Date  . TUBAL LIGATION      Family History  Problem Relation Age of Onset  . Hypertension Mother     Social History Social History  Substance Use Topics  . Smoking status: Never Smoker  . Smokeless tobacco: Never Used  . Alcohol use No    No Known Allergies  Current Outpatient Prescriptions  Medication Sig Dispense Refill  . Chlorphen-PE-Acetaminophen (TYLENOL SINUS CONGESTION/PAIN PO) Take 2 tablets by mouth 2 (two) times daily.    . megestrol (MEGACE) 40 MG tablet Take 1 tablet (40 mg total) by mouth 2 (two) times daily. 30 tablet 3   Current Facility-Administered Medications  Medication Dose Route Frequency Provider Last Rate Last Dose  . leuprolide (LUPRON) injection 11.25 mg  11.25 mg Intramuscular Once Woodroe Mode, MD        Review of Systems Review of Systems  Genitourinary: Positive for pelvic pain (cramps) and vaginal discharge (clear no odor or itching). Negative for vaginal bleeding.    Blood pressure 112/77, pulse 74, weight 149 lb 1.6 oz (67.6 kg).  Physical Exam Physical Exam  Constitutional: She is oriented to person, place, and time. She appears well-developed. No distress.  Cardiovascular: Normal rate.   Pulmonary/Chest: Effort normal. No respiratory distress.  Neurological: She is alert and oriented to person, place, and time.  Psychiatric: She has a normal mood and affect. Her behavior is normal.    Data  Reviewed CLINICAL DATA:  Dysfunctional uterine bleeding. Possible submucosal fibroid.  EXAM: [place] SONOHYSTEROGRAM  TECHNIQUE: Following cleansing of the cervix and vagina with Betadine, a hysterosalpingogram catheter was placed within the endocervical canal. Sonohysterogram was then performed with transvaginal sonography during infusion of sterile saline solution into the endometrial cavity.  COMPARISON:  10/08/2016 pelvic sonogram.  FINDINGS: There is a 2.6 x 2.0 x 2.0 cm right posterior upper uterine body, which demonstrates an approximately 30% submucosal component at sonohysterography. No additional uterine fibroids .  There are 2 tiny endometrial polyps measuring 0.4 cm in the anterior mid uterine cavity and 0.4 cm in the posterior mid fundal uterine cavity. There is a small synechiae in the posterior fundal uterine cavity adjacent to the fibroid. Bilayer endometrial thickness 6 mm at sonohysterography.  IMPRESSION: 1. Right posterior upper uterine body 2.6 cm fibroid with 30% submucosal component. No additional uterine fibroids. 2. Two tiny 4 mm endometrial polyps. 3. 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   Electronically Signed   By: Ilona Sorrel M.D.   On: 01/17/2017 09:47  Assessment    DUB, partial submucosal fibroid S/P BTL, benign endometrial biopsy Candidate for ablation     Plan    Schedule endometrial ablation  The risks of surgery were discussed in detail with the patient including but not limited to: bleeding which may require transfusion or reoperation; infection which may require prolonged hospitalization or re-hospitalization and antibiotic therapy; injury to  bowel, bladder, ureters and major vessels or other surrounding organs; need for additional procedures including laparotomy; thromboembolic phenomenon, incisional problems and other postoperative or anesthesia complications.  Patient was told that  the likelihood that her condition and symptoms will be treated effectively with this surgical management was very high; the postoperative expectations were also discussed in detail. The patient also understands the alternative treatment options which were discussed in full. All questions were answered.  She was told that she will be contacted by our surgical scheduler regarding the time and date of her surgery; routine preoperative instructions of having nothing to eat or drink after midnight on the day prior to surgery and also coming to the hospital 1.5 hours prior to her time of surgery were also emphasized.  She was told she may be called for a preoperative appointment about a week prior to surgery and will be given further preoperative instructions at that visit. Printed patient education handouts about the procedure were given to the patient to review at home.         Emeterio Reeve 03/15/2017, 2:53 PM

## 2017-03-16 ENCOUNTER — Encounter (HOSPITAL_COMMUNITY): Payer: Self-pay

## 2017-03-21 ENCOUNTER — Other Ambulatory Visit: Payer: Self-pay | Admitting: Obstetrics & Gynecology

## 2017-04-18 ENCOUNTER — Other Ambulatory Visit: Payer: Self-pay

## 2017-04-18 ENCOUNTER — Encounter (HOSPITAL_BASED_OUTPATIENT_CLINIC_OR_DEPARTMENT_OTHER): Payer: Self-pay | Admitting: *Deleted

## 2017-04-25 ENCOUNTER — Ambulatory Visit (HOSPITAL_BASED_OUTPATIENT_CLINIC_OR_DEPARTMENT_OTHER): Payer: Self-pay | Admitting: Anesthesiology

## 2017-04-25 ENCOUNTER — Ambulatory Visit (HOSPITAL_BASED_OUTPATIENT_CLINIC_OR_DEPARTMENT_OTHER)
Admission: RE | Admit: 2017-04-25 | Discharge: 2017-04-25 | Disposition: A | Discharge status: Home / Self Care | Payer: Self-pay | Source: Ambulatory Visit | Attending: Obstetrics & Gynecology | Admitting: Obstetrics & Gynecology

## 2017-04-25 ENCOUNTER — Encounter (HOSPITAL_BASED_OUTPATIENT_CLINIC_OR_DEPARTMENT_OTHER): Payer: Self-pay | Admitting: Certified Registered"

## 2017-04-25 ENCOUNTER — Encounter (HOSPITAL_BASED_OUTPATIENT_CLINIC_OR_DEPARTMENT_OTHER)
Admission: RE | Disposition: A | Discharge status: Home / Self Care | Payer: Self-pay | Source: Ambulatory Visit | Attending: Obstetrics & Gynecology

## 2017-04-25 DIAGNOSIS — Z87891 Personal history of nicotine dependence: Secondary | ICD-10-CM | POA: Insufficient documentation

## 2017-04-25 DIAGNOSIS — Z79818 Long term (current) use of other agents affecting estrogen receptors and estrogen levels: Secondary | ICD-10-CM | POA: Insufficient documentation

## 2017-04-25 DIAGNOSIS — Z9851 Tubal ligation status: Secondary | ICD-10-CM | POA: Insufficient documentation

## 2017-04-25 DIAGNOSIS — Z8249 Family history of ischemic heart disease and other diseases of the circulatory system: Secondary | ICD-10-CM | POA: Insufficient documentation

## 2017-04-25 DIAGNOSIS — Z8619 Personal history of other infectious and parasitic diseases: Secondary | ICD-10-CM | POA: Insufficient documentation

## 2017-04-25 DIAGNOSIS — N920 Excessive and frequent menstruation with regular cycle: Secondary | ICD-10-CM | POA: Insufficient documentation

## 2017-04-25 DIAGNOSIS — N938 Other specified abnormal uterine and vaginal bleeding: Secondary | ICD-10-CM | POA: Insufficient documentation

## 2017-04-25 DIAGNOSIS — D250 Submucous leiomyoma of uterus: Secondary | ICD-10-CM | POA: Insufficient documentation

## 2017-04-25 DIAGNOSIS — N840 Polyp of corpus uteri: Secondary | ICD-10-CM | POA: Insufficient documentation

## 2017-04-25 HISTORY — PX: ENDOMETRIAL ABLATION: SHX621

## 2017-04-25 LAB — POCT PREGNANCY, URINE: Preg Test, Ur: NEGATIVE

## 2017-04-25 SURGERY — ABLATION, ENDOMETRIUM
Anesthesia: Monitor Anesthesia Care | Site: Uterus

## 2017-04-25 MED ORDER — PROPOFOL 10 MG/ML IV BOLUS
INTRAVENOUS | Status: DC | PRN
  Administered 2017-04-25: 30 mg via INTRAVENOUS

## 2017-04-25 MED ORDER — FENTANYL CITRATE (PF) 100 MCG/2ML IJ SOLN
INTRAMUSCULAR | Status: AC
  Filled 2017-04-25: qty 2

## 2017-04-25 MED ORDER — BUPIVACAINE HCL (PF) 0.25 % IJ SOLN
INTRAMUSCULAR | Status: DC | PRN
  Administered 2017-04-25: 3 mL

## 2017-04-25 MED ORDER — MIDAZOLAM HCL 2 MG/2ML IJ SOLN
1.0000 mg | INTRAMUSCULAR | Status: DC | PRN
  Administered 2017-04-25: 2 mg via INTRAVENOUS

## 2017-04-25 MED ORDER — HYDROMORPHONE HCL 1 MG/ML IJ SOLN
INTRAMUSCULAR | Status: AC
  Filled 2017-04-25: qty 0.5

## 2017-04-25 MED ORDER — KETOROLAC TROMETHAMINE 30 MG/ML IJ SOLN
INTRAMUSCULAR | Status: AC
  Filled 2017-04-25: qty 1

## 2017-04-25 MED ORDER — PROPOFOL 10 MG/ML IV BOLUS
INTRAVENOUS | Status: AC
  Filled 2017-04-25: qty 40

## 2017-04-25 MED ORDER — ONDANSETRON HCL 4 MG/2ML IJ SOLN
INTRAMUSCULAR | Status: AC
  Filled 2017-04-25: qty 2

## 2017-04-25 MED ORDER — LACTATED RINGERS IV SOLN
INTRAVENOUS | Status: DC
  Administered 2017-04-25 (×2): via INTRAVENOUS

## 2017-04-25 MED ORDER — DEXAMETHASONE SODIUM PHOSPHATE 10 MG/ML IJ SOLN
INTRAMUSCULAR | Status: AC
  Filled 2017-04-25: qty 1

## 2017-04-25 MED ORDER — HYDROMORPHONE HCL 1 MG/ML IJ SOLN
0.2500 mg | INTRAMUSCULAR | Status: DC | PRN
  Administered 2017-04-25: 0.5 mg via INTRAVENOUS
  Administered 2017-04-25: 0.25 mg via INTRAVENOUS
  Administered 2017-04-25: 0.5 mg via INTRAVENOUS
  Administered 2017-04-25: 0.25 mg via INTRAVENOUS

## 2017-04-25 MED ORDER — LACTATED RINGERS IV SOLN: INTRAVENOUS | Status: DC

## 2017-04-25 MED ORDER — PROPOFOL 500 MG/50ML IV EMUL
INTRAVENOUS | Status: DC | PRN
  Administered 2017-04-25: 200 ug/kg/min via INTRAVENOUS

## 2017-04-25 MED ORDER — ONDANSETRON HCL 4 MG/2ML IJ SOLN
INTRAMUSCULAR | Status: DC | PRN
  Administered 2017-04-25: 4 mg via INTRAVENOUS

## 2017-04-25 MED ORDER — OXYCODONE HCL 5 MG/5ML PO SOLN: 5.0000 mg | Freq: Once | ORAL | Status: DC | PRN

## 2017-04-25 MED ORDER — LIDOCAINE 2% (20 MG/ML) 5 ML SYRINGE
INTRAMUSCULAR | Status: AC
  Filled 2017-04-25: qty 5

## 2017-04-25 MED ORDER — SCOPOLAMINE 1 MG/3DAYS TD PT72
1.0000 | MEDICATED_PATCH | Freq: Once | TRANSDERMAL | Status: DC | PRN
  Administered 2017-04-25: 1.5 mg via TRANSDERMAL

## 2017-04-25 MED ORDER — KETOROLAC TROMETHAMINE 30 MG/ML IJ SOLN
30.0000 mg | Freq: Once | INTRAMUSCULAR | Status: AC
  Administered 2017-04-25: 30 mg via INTRAVENOUS

## 2017-04-25 MED ORDER — MIDAZOLAM HCL 2 MG/2ML IJ SOLN
INTRAMUSCULAR | Status: AC
  Filled 2017-04-25: qty 2

## 2017-04-25 MED ORDER — OXYCODONE HCL 5 MG PO TABS: 5.0000 mg | ORAL_TABLET | Freq: Once | ORAL | Status: DC | PRN

## 2017-04-25 MED ORDER — SCOPOLAMINE 1 MG/3DAYS TD PT72
MEDICATED_PATCH | TRANSDERMAL | Status: AC
  Filled 2017-04-25: qty 1

## 2017-04-25 MED ORDER — IBUPROFEN 600 MG PO TABS: 600.0000 mg | ORAL_TABLET | Freq: Four times a day (QID) | ORAL | 1 refills | Status: DC | PRN

## 2017-04-25 MED ORDER — FENTANYL CITRATE (PF) 100 MCG/2ML IJ SOLN
50.0000 ug | INTRAMUSCULAR | Status: AC | PRN
  Administered 2017-04-25: 25 ug via INTRAVENOUS
  Administered 2017-04-25: 50 ug via INTRAVENOUS
  Administered 2017-04-25: 25 ug via INTRAVENOUS

## 2017-04-25 MED ORDER — HYDROMORPHONE HCL 1 MG/ML IJ SOLN
INTRAMUSCULAR | Status: AC
Start: 2017-04-25 — End: ?
  Filled 2017-04-25: qty 0.5

## 2017-04-25 SURGICAL SUPPLY — 16 items
ABLATOR ENDOMETRIAL BIPOLAR (ABLATOR) ×4 IMPLANT
CANISTER SUCT 3000ML PPV (MISCELLANEOUS) ×4 IMPLANT
CATH ROBINSON RED A/P 16FR (CATHETERS) ×1 IMPLANT
CLOTH BEACON ORANGE TIMEOUT ST (SAFETY) ×4 IMPLANT
CONTAINER PREFILL 10% NBF 60ML (FORM) ×8 IMPLANT
GLOVE BIO SURGEON STRL SZ 6.5 (GLOVE) ×1 IMPLANT
GLOVE BIO SURGEONS STRL SZ 6.5 (GLOVE)
GLOVE BIOGEL PI IND STRL 7.0 (GLOVE) ×4 IMPLANT
GLOVE BIOGEL PI INDICATOR 7.0 (GLOVE) ×4
GLOVE SURG SS PI 6.5 STRL IVOR (GLOVE) ×3 IMPLANT
GLOVE SURG SS PI 7.0 STRL IVOR (GLOVE) ×3 IMPLANT
GOWN STRL REUS W/TWL LRG LVL3 (GOWN DISPOSABLE) ×8 IMPLANT
PACK VAGINAL MINOR WOMEN LF (CUSTOM PROCEDURE TRAY) ×4 IMPLANT
PAD OB MATERNITY 4.3X12.25 (PERSONAL CARE ITEMS) ×4 IMPLANT
TOWEL OR 17X24 6PK STRL BLUE (TOWEL DISPOSABLE) ×8 IMPLANT
TUBING AQUILEX INFLOW (TUBING) ×4 IMPLANT

## 2017-04-25 NOTE — Discharge Instructions (Signed)
Endometrial Ablation Endometrial ablation is a procedure that destroys the thin inner layer of the lining of the uterus (endometrium). This procedure may be done:  To stop heavy periods.  To stop bleeding that is causing anemia.  To control irregular bleeding.  To treat bleeding caused by small tumors (fibroids) in the endometrium.  This procedure is often an alternative to major surgery, such as removal of the uterus and cervix (hysterectomy). As a result of this procedure:  You may not be able to have children. However, if you are premenopausal (you have not gone through menopause): ? You may still have a small chance of getting pregnant. ? You will need to use a reliable method of birth control after the procedure to prevent pregnancy.  You may stop having a menstrual period, or you may have only a small amount of bleeding during your period. Menstruation may return several years after the procedure.  Tell a health care provider about:  Any allergies you have.  All medicines you are taking, including vitamins, herbs, eye drops, creams, and over-the-counter medicines.  Any problems you or family members have had with the use of anesthetic medicines.  Any blood disorders you have.  Any surgeries you have had.  Any medical conditions you have. What are the risks? Generally, this is a safe procedure. However, problems may occur, including:  A hole (perforation) in the uterus or bowel.  Infection of the uterus, bladder, or vagina.  Bleeding.  Damage to other structures or organs.  An air bubble in the lung (air embolus).  Problems with pregnancy after the procedure.  Failure of the procedure.  Decreased ability to diagnose cancer in the endometrium.  What happens before the procedure?  You will have tests of your endometrium to make sure there are no pre-cancerous cells or cancer cells present.  You may have an ultrasound of the uterus.  You may be given  medicines to thin the endometrium.  Ask your health care provider about: ? Changing or stopping your regular medicines. This is especially important if you take diabetes medicines or blood thinners. ? Taking medicines such as aspirin and ibuprofen. These medicines can thin your blood. Do not take these medicines before your procedure if your doctor tells you not to.  Plan to have someone take you home from the hospital or clinic. What happens during the procedure?  You will lie on an exam table with your feet and legs supported as in a pelvic exam.  To lower your risk of infection: ? Your health care team will wash or sanitize their hands and put on germ-free (sterile) gloves. ? Your genital area will be washed with soap.  An IV tube will be inserted into one of your veins.  You will be given a medicine to help you relax (sedative).  A surgical instrument with a light and camera (resectoscope) will be inserted into your vagina and moved into your uterus. This allows your surgeon to see inside your uterus.  Endometrial tissue will be removed using one of the following methods: ? Radiofrequency. This method uses a radiofrequency-alternating electric current to remove the endometrium. ? Cryotherapy. This method uses extreme cold to freeze the endometrium. ? Heated-free liquid. This method uses a heated saltwater (saline) solution to remove the endometrium. ? Microwave. This method uses high-energy microwaves to heat up the endometrium and remove it. ? Thermal balloon. This method involves inserting a catheter with a balloon tip into the uterus. The balloon tip is   filled with heated fluid to remove the endometrium. The procedure may vary among health care providers and hospitals. What happens after the procedure?  Your blood pressure, heart rate, breathing rate, and blood oxygen level will be monitored until the medicines you were given have worn off.  As tissue healing occurs, you may  notice vaginal bleeding for 4-6 weeks after the procedure. You may also experience: ? Cramps. ? Thin, watery vaginal discharge that is light pink or brown in color. ? A need to urinate more frequently than usual. ? Nausea.  Do not drive for 24 hours if you were given a sedative.  Do not have sex or insert anything into your vagina until your health care provider approves. Summary  Endometrial ablation is done to treat the many causes of heavy menstrual bleeding.  The procedure may be done only after medications have been tried to control the bleeding.  Plan to have someone take you home from the hospital or clinic. This information is not intended to replace advice given to you by your health care provider. Make sure you discuss any questions you have with your health care provider. Document Released: 04/07/2004 Document Revised: 06/15/2016 Document Reviewed: 06/15/2016 Elsevier Interactive Patient Education  2017 [employer].    Call your surgeon if you experience:   1.  Fever over 101.0. 2.  Inability to urinate. 3.  Nausea and/or vomiting. 4.  Extreme swelling or bruising at the surgical site. 5.  Continued bleeding from the incision. 6.  Increased pain, redness or drainage from the incision. 7.  Problems related to your pain medication. 8.  Any problems and/or concerns    Post Anesthesia Home Care Instructions  Activity: Get plenty of rest for the remainder of the day. A responsible individual must stay with you for 24 hours following the procedure.  For the next 24 hours, DO NOT: -Drive a car -Paediatric nurse -Drink alcoholic beverages -Take any medication unless instructed by your physician -Make any legal decisions or sign important papers.  Meals: Start with liquid foods such as gelatin or soup. Progress to regular foods as tolerated. Avoid greasy, spicy, heavy foods. If nausea and/or vomiting occur, drink only clear liquids until the nausea and/or vomiting  subsides. Call your physician if vomiting continues.  Special Instructions/Symptoms: Your throat may feel dry or sore from the anesthesia or the breathing tube placed in your throat during surgery. If this causes discomfort, gargle with warm salt water. The discomfort should disappear within 24 hours.  If you had a scopolamine patch placed behind your ear for the management of post- operative nausea and/or vomiting:  1. The medication in the patch is effective for 72 hours, after which it should be removed.  Wrap patch in a tissue and discard in the trash. Wash hands thoroughly with soap and water. 2. You may remove the patch earlier than 72 hours if you experience unpleasant side effects which may include dry mouth, dizziness or visual disturbances. 3. Avoid touching the patch. Wash your hands with soap and water after contact with the patch.

## 2017-04-25 NOTE — Anesthesia Preprocedure Evaluation (Addendum)
Anesthesia Evaluation  Patient identified by MRN, date of birth, ID band Patient awake    Reviewed: Allergy & Precautions, NPO status , Patient's Chart, lab work & pertinent test results  Airway Mallampati: I  TM Distance: >3 FB Neck ROM: Full    Dental no notable dental hx.    Pulmonary neg pulmonary ROS, former smoker,    breath sounds clear to auscultation       Cardiovascular negative cardio ROS   Rhythm:Regular Rate:Normal     Neuro/Psych negative neurological ROS  negative psych ROS   GI/Hepatic negative GI ROS, Neg liver ROS,   Endo/Other  negative endocrine ROS  Renal/GU negative Renal ROS  Female GU complaint     Musculoskeletal negative musculoskeletal ROS (+)   Abdominal   Peds negative pediatric ROS (+)  Hematology negative hematology ROS (+)   Anesthesia Other Findings   Reproductive/Obstetrics negative OB ROS                             Anesthesia Physical Anesthesia Plan  ASA: I  Anesthesia Plan: MAC   Post-op Pain Management:    Induction: Intravenous  PONV Risk Score and Plan: 3 and Treatment may vary due to age or medical condition, Ondansetron, Dexamethasone and Midazolam  Airway Management Planned: Natural Airway and Simple Face Mask  Additional Equipment:   Intra-op Plan:   Post-operative Plan:   Informed Consent: I have reviewed the patients History and Physical, chart, labs and discussed the procedure including the risks, benefits and alternatives for the proposed anesthesia with the patient or authorized representative who has indicated his/her understanding and acceptance.   Dental advisory given  Plan Discussed with: CRNA  Anesthesia Plan Comments:       Anesthesia Quick Evaluation

## 2017-04-25 NOTE — Anesthesia Postprocedure Evaluation (Signed)
Anesthesia Post Note  Patient: Laurie Bell  Procedure(s) Performed: ENDOMETRIAL ABLATION With NOVASURE (N/A Uterus)     Patient location during evaluation: PACU Anesthesia Type: MAC Level of consciousness: awake and sedated Pain management: pain level controlled Vital Signs Assessment: post-procedure vital signs reviewed and stable Respiratory status: spontaneous breathing, nonlabored ventilation, respiratory function stable and patient connected to nasal cannula oxygen Cardiovascular status: stable and blood pressure returned to baseline Postop Assessment: no apparent nausea or vomiting Anesthetic complications: no    Last Vitals:  Vitals:   04/25/17 1045 04/25/17 1100  BP: 108/82 108/83  Pulse: 78 66  Resp: 14 12  Temp:    SpO2: 100% 100%    Last Pain:  Vitals:   04/25/17 1115  TempSrc:   PainSc: 6                  Taivon Haroon,JAMES TERRILL

## 2017-04-25 NOTE — H&P (Signed)
Cc: heavy prolonged periods, for endometrial ablation HPI Laurie Bell is a 36 y.o. female.  [id number] No LMP recorded. No vaginal bleeding currently after Lupron and megace. She has SHG and wants to discuss endometrial ablation. Nexplanon and Mirena offered and she declines. Scheduled for NovaSure ablation today HPI      Past Medical History:  Diagnosis Date  . Chlamydia contact, treated   . No pertinent past medical history          Past Surgical History:  Procedure Laterality Date  . TUBAL LIGATION           Family History  Problem Relation Age of Onset  . Hypertension Mother     Social History     Social History  Substance Use Topics  . Smoking status: Never Smoker  . Smokeless tobacco: Never Used  . Alcohol use No    No Known Allergies        Current Outpatient Prescriptions  Medication Sig Dispense Refill  . Chlorphen-PE-Acetaminophen (TYLENOL SINUS CONGESTION/PAIN PO) Take 2 tablets by mouth 2 (two) times daily.    . megestrol (MEGACE) 40 MG tablet Take 1 tablet (40 mg total) by mouth 2 (two) times daily. 30 tablet 3            Current Facility-Administered Medications  Medication Dose Route Frequency Provider Last Rate Last Dose  . leuprolide (LUPRON) injection 11.25 mg  11.25 mg Intramuscular Once Woodroe Mode, MD        Review of Systems Review of Systems  Genitourinary: Positive for pelvic pain (cramps) and vaginal discharge (clear no odor or itching). Negative for vaginal bleeding.    Blood pressure 108/80, pulse 85, temperature 98.6 F (37 C), temperature source Oral, height 5\' 6"  (1.676 m), weight 150 lb (68 kg), SpO2 100 %.   Physical Exam Physical Exam  Constitutional: She is oriented to person, place, and time. She appears well-developed. No distress.  Cardiovascular: Normal rate.   Pulmonary/Chest: Effort normal. No respiratory distress.  Neurological: She is alert and oriented to person, place, and time.   Psychiatric: She has a normal mood and affect. Her behavior is normal.    Data Reviewed CLINICAL DATA: Dysfunctional uterine bleeding. Possible submucosal fibroid.  EXAM: [place] SONOHYSTEROGRAM  TECHNIQUE: Following cleansing of the cervix and vagina with Betadine, a hysterosalpingogram catheter was placed within the endocervical canal. Sonohysterogram was then performed with transvaginal sonography during infusion of sterile saline solution into the endometrial cavity.  COMPARISON: 10/08/2016 pelvic sonogram.  FINDINGS: There is a 2.6 x 2.0 x 2.0 cm right posterior upper uterine body, which demonstrates an approximately 30% submucosal component at sonohysterography. No additional uterine fibroids .  There are 2 tiny endometrial polyps measuring 0.4 cm in the anterior mid uterine cavity and 0.4 cm in the posterior mid fundal uterine cavity. There is a small synechiae in the posterior fundal uterine cavity adjacent to the fibroid. Bilayer endometrial thickness 6 mm at sonohysterography.  IMPRESSION: 1. Right posterior upper uterine body 2.6 cm fibroid with 30% submucosal component. No additional uterine fibroids. 2. Two tiny 4 mm endometrial polyps. 3. 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   Electronically Signed By: Ilona Sorrel M.D. On: 01/17/2017 09:47  Assessment    DUB, partial submucosal fibroid S/P BTL, benign endometrial biopsy Candidate for ablation   Plan    Schedule endometrial ablation  The risks of surgery were discussed in detail with the patient including but not  limited to: bleeding which may require transfusion or reoperation; infection which may require prolonged hospitalization or re-hospitalization and antibiotic therapy; injury to bowel, bladder, ureters and major vessels or other surrounding organs; need for additional procedures including laparotomy; thromboembolic phenomenon, incisional  problems and other postoperative or anesthesia complications.  Patient was told that the likelihood that her condition and symptoms will be treated effectively with this surgical management was very high; the postoperative expectations were also discussed in detail. The patient also understands the alternative treatment options which were discussed in full. All questions were answered  Woodroe Mode, MD 04/25/2017

## 2017-04-25 NOTE — Op Note (Signed)
PREOPERATIVE DIAGNOSIS:  Irregular uterine bleeding POSTOPERATIVE DIAGNOSIS: The same PROCEDURE:  Hysteroscopy,  Dilation and Curettage, NovaSure Endometrial Ablation SURGEON:  Woodroe Mode, MD   INDICATIONS: 36 y.o. [phone]  here for NovaSure Endometrial Ablation.  Risks of surgery were discussed with the patient including but not limited to: bleeding which may require transfusion; infection which may require antibiotics; injury to uterus leading to risk of injury to surrounding intraperitoneal organs, need for additional procedures including laparoscopy or laparotomy, and other postoperative/anesthesia complications. Written informed consent was obtained.   FINDINGS:  A 6 week size uterus.    ANESTHESIA:   General and paracervical block with 10 ml of 0.25% Marcaine INTRAVENOUS FLUIDS:  1000 ml of LR ESTIMATED BLOOD LOSS:  Less than 20 ml SPECIMENS: none COMPLICATIONS:  None immediate  PROCEDURE DETAILS:  The patient was taken to the operating room where MAC anesthesia was administered and was found to be adequate.  After an adequate timeout was performed, she was placed in the dorsal lithotomy position and examined; then prepped and draped in the sterile manner. A speculum was then placed in the patient's vagina and a single tooth tenaculum was applied to the anterior lip of the cervix.  A paracervical block using 0.25% Marcaine was administered. The sound was used to obtain the cervical and uterine cavity length measurements at 5 cm and 5 cm respectively; total sounding length 10 cm.  The cervix was dilated manually with Hegar dilators to accommodate the NovaSure.  The NovaSure device was inserted, and a cavity width of 4.5 cm was determined.The endometrial ablation was performed.  The tenaculum was removed from the anterior lip of the cervix, and the vaginal speculum was removed after noting good hemostasis.  The patient tolerated the procedure well and was taken to the recovery area awake,  extubated and in stable condition.  The patient will be discharged to home as per PACU criteria.  Routine postoperative instructions given.  She was prescribed  Ibuprofen.  She will follow up in the clinic in 4 weeks for postoperative evaluation.  Woodroe Mode, MD 04/25/2017 10:19 AM

## 2017-04-25 NOTE — Transfer of Care (Signed)
Immediate Anesthesia Transfer of Care Note  Patient: Laurie Bell  Procedure(s) Performed: ENDOMETRIAL ABLATION With NOVASURE (N/A Uterus)  Patient Location: PACU  Anesthesia Type:MAC  Level of Consciousness: awake, alert  and oriented  Airway & Oxygen Therapy: Patient Spontanous Breathing and Patient connected to face mask oxygen  Post-op Assessment: Report given to RN and Post -op Vital signs reviewed and unstable, Anesthesiologist notified  Post vital signs: Reviewed and stable  Last Vitals:  Vitals:   04/25/17 0915  BP: 108/80  Pulse: 85  Temp: 37 C  SpO2: 100%    Last Pain:  Vitals:   04/25/17 0915  TempSrc: Oral  PainSc: 0-No pain         Complications: No apparent anesthesia complications

## 2017-04-26 ENCOUNTER — Encounter (HOSPITAL_BASED_OUTPATIENT_CLINIC_OR_DEPARTMENT_OTHER): Payer: Self-pay | Admitting: Obstetrics & Gynecology

## 2017-05-30 DIAGNOSIS — Z79899 Other long term (current) drug therapy: Secondary | ICD-10-CM | POA: Insufficient documentation

## 2017-05-30 DIAGNOSIS — N3000 Acute cystitis without hematuria: Secondary | ICD-10-CM | POA: Insufficient documentation

## 2017-05-30 DIAGNOSIS — N898 Other specified noninflammatory disorders of vagina: Secondary | ICD-10-CM | POA: Insufficient documentation

## 2017-05-31 ENCOUNTER — Other Ambulatory Visit: Payer: Self-pay

## 2017-05-31 ENCOUNTER — Emergency Department (HOSPITAL_COMMUNITY)
Admission: EM | Admit: 2017-05-31 | Discharge: 2017-05-31 | Disposition: A | Discharge status: Home / Self Care | Payer: Self-pay | Attending: Emergency Medicine | Admitting: Emergency Medicine

## 2017-05-31 ENCOUNTER — Encounter (HOSPITAL_COMMUNITY): Payer: Self-pay

## 2017-05-31 DIAGNOSIS — N3000 Acute cystitis without hematuria: Secondary | ICD-10-CM

## 2017-05-31 LAB — WET PREP, GENITAL
Clue Cells Wet Prep HPF POC: NONE SEEN
Sperm: NONE SEEN
Trich, Wet Prep: NONE SEEN
Yeast Wet Prep HPF POC: NONE SEEN

## 2017-05-31 LAB — URINALYSIS, ROUTINE W REFLEX MICROSCOPIC
Bilirubin Urine: NEGATIVE
Glucose, UA: NEGATIVE mg/dL
Ketones, ur: NEGATIVE mg/dL
Nitrite: NEGATIVE
Protein, ur: NEGATIVE mg/dL
Specific Gravity, Urine: 1.021 (ref 1.005–1.030)
pH: 6 (ref 5.0–8.0)

## 2017-05-31 LAB — POC URINE PREG, ED: Preg Test, Ur: NEGATIVE

## 2017-05-31 LAB — GC/CHLAMYDIA PROBE AMP (~~LOC~~) NOT AT ARMC
Chlamydia: NEGATIVE
Neisseria Gonorrhea: NEGATIVE

## 2017-05-31 MED ORDER — CEPHALEXIN 500 MG PO CAPS: 500.0000 mg | ORAL_CAPSULE | Freq: Two times a day (BID) | ORAL | 0 refills | Status: DC

## 2017-05-31 MED ORDER — CEPHALEXIN 250 MG PO CAPS
250.0000 mg | ORAL_CAPSULE | Freq: Once | ORAL | Status: AC
  Administered 2017-05-31: 250 mg via ORAL
  Filled 2017-05-31: qty 1

## 2017-05-31 NOTE — ED Triage Notes (Signed)
Pt reports urinary frequency and vaginal odor x3 days. She is also complaining of lower abdominal pain. She reports brown discharge. She had outpatient surgery for a fibroid tumor less than a month ago. A&Ox4. Ambulatory.

## 2017-05-31 NOTE — ED Provider Notes (Signed)
[facility] DEPT Provider Note   CSN: [id number] Arrival date & time: 05/30/17  2310     History   Chief Complaint Chief Complaint  Patient presents with  . Abdominal Pain  . Urinary Frequency    HPI Laurie Bell is a 36 y.o. female.  Patient presents to the ED with a chief complaint of dysuria and vaginal discharge.  Onset of the dysuria was a little over a week ago.  She reports that she has had vaginal discharge since having endometrial ablation and was told this would be normal.  She states that it feels like she has a UTI, but she wants to be checked for discharge as well.  She denies any sexual contacts.  Denies any fevers, chills, nausea, vomiting.  She has not taken anything for the symptoms.  She reports associated urinary frequency.   The history is provided by the patient. No language interpreter was used.    Past Medical History:  Diagnosis Date  . Chlamydia contact, treated   . No pertinent past medical history     Patient Active Problem List   Diagnosis Date Noted  . DUB (dysfunctional uterine bleeding) 01/12/2017  . 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 ENDOMETRIAL ABLATION N/A 04/25/2017   Procedure: ENDOMETRIAL ABLATION With NOVASURE;  Surgeon: Woodroe Mode, MD;  Location: [facility];  Service: Gynecology;  Laterality: N/A;  . TUBAL LIGATION      OB History    Gravida Para Term Preterm AB Living   2 2 2  0 0 2   SAB TAB Ectopic Multiple Live Births   0 0 0 0         Home Medications    Prior to Admission medications   Medication Sig Start Date End Date Taking? Authorizing Provider  Chlorphen-PE-Acetaminophen (TYLENOL SINUS CONGESTION/PAIN PO) Take 2 tablets by mouth 2 (two) times daily.    [provider]  ibuprofen (ADVIL,MOTRIN) 600 MG tablet Take 1 tablet (600 mg total) every 6 (six) hours as needed by mouth. 04/25/17   Woodroe Mode, MD  Multiple Vitamin (MULTIVITAMIN) tablet Take 1 tablet daily by mouth.    [provider]    Family History Family History  Problem Relation Age of Onset  . Hypertension Mother     Social History Social History   Tobacco Use  . Smoking status: Former Research scientist (life sciences)  . Smokeless tobacco: Never Used  . Tobacco comment: Quit  7-8 years ago  Substance Use Topics  . Alcohol use: No  . Drug use: No     Allergies   Latex   Review of Systems Review of Systems  All other systems reviewed and are negative.    Physical Exam Updated Vital Signs BP 121/80   Pulse 78   Temp 98.5 F (36.9 C) (Oral)   Resp 18   SpO2 100%   Physical Exam  Constitutional: She is oriented to person, place, and time. She appears well-developed and well-nourished.  HENT:  Head: Normocephalic and atraumatic.  Eyes: Conjunctivae and EOM are normal. Pupils are equal, round, and reactive to light.  Neck: Normal range of motion. Neck supple.  Cardiovascular: Normal rate and regular rhythm. Exam reveals no gallop and no friction rub.  No murmur heard. Pulmonary/Chest: Effort normal and breath sounds normal. No respiratory distress. She has no wheezes. She has no rales. She exhibits no tenderness.  Abdominal: Soft. Bowel sounds  are normal. She exhibits no distension and no mass. There is no tenderness. There is no rebound and no guarding.  Genitourinary:  Genitourinary Comments: Pelvic exam chaperoned by female ER tech, bimanual deferred, dark discharge, no bleeding, no CMT or friability, no foreign body, no injury to the external genitalia, no other significant findings   Musculoskeletal: Normal range of motion. She exhibits no edema or tenderness.  Neurological: She is alert and oriented to person, place, and time.  Skin: Skin is warm and dry.  Psychiatric: She has a normal mood and affect. Her behavior is normal. Judgment and thought content normal.  Nursing note and vitals  reviewed.    ED Treatments / Results  Labs (all labs ordered are listed, but only abnormal results are displayed) Labs Reviewed  URINALYSIS, ROUTINE W REFLEX MICROSCOPIC - Abnormal; Notable for the following components:      Result Value   Hgb urine dipstick MODERATE (*)    Leukocytes, UA TRACE (*)    Bacteria, UA FEW (*)    Squamous Epithelial / LPF 0-5 (*)    All other components within normal limits  WET PREP, GENITAL  POC URINE PREG, ED  GC/CHLAMYDIA PROBE AMP () NOT AT [facility]    EKG  EKG Interpretation None       Radiology No results found.  Procedures Procedures (including critical care time)  Medications Ordered in ED Medications - No data to display   Initial Impression / Assessment and Plan / ED Course  I have reviewed the triage vital signs and the nursing notes.  Pertinent labs & imaging results that were available during my care of the patient were reviewed by me and considered in my medical decision making (see chart for details).     Patient with dysuria and urinary frequency.  Also reports having vaginal discharge since endometrial ablation, but states that she was told this would be normal.  She believes she has a UTI, but wants to see if the discharge is infected.    VSS.  Abdomen is soft.  Doubt surgical or acute abdomen.  UA in conjunction with symptoms is consistent with UTI.  Will treat with keflex.  Recommend OBGYN follow-up.  Discharge from vagina is presumably normal from her recent procedure.  There is no evidence of abscess or acute abdomen on exam.  DC to home.  Final Clinical Impressions(s) / ED Diagnoses   Final diagnoses:  Acute cystitis without hematuria    ED Discharge Orders        Ordered    cephALEXin (KEFLEX) 500 MG capsule  2 times daily     05/31/17 0451       Montine Circle, PA-C 05/31/17 0451    Molpus, Jenny Reichmann, MD 05/31/17 [phone]

## 2017-06-16 ENCOUNTER — Other Ambulatory Visit: Payer: Self-pay | Admitting: Obstetrics & Gynecology

## 2017-06-16 DIAGNOSIS — N938 Other specified abnormal uterine and vaginal bleeding: Secondary | ICD-10-CM

## 2017-06-22 ENCOUNTER — Ambulatory Visit (INDEPENDENT_AMBULATORY_CARE_PROVIDER_SITE_OTHER): Payer: Self-pay | Admitting: Obstetrics & Gynecology

## 2017-06-22 ENCOUNTER — Encounter: Payer: Self-pay | Admitting: Obstetrics & Gynecology

## 2017-06-22 VITALS — BP 113/82 | HR 77 | Ht 66.0 in | Wt 147.8 lb

## 2017-06-22 DIAGNOSIS — Z9889 Other specified postprocedural states: Secondary | ICD-10-CM

## 2017-06-22 DIAGNOSIS — N938 Other specified abnormal uterine and vaginal bleeding: Secondary | ICD-10-CM

## 2017-06-22 MED ORDER — MEDROXYPROGESTERONE ACETATE 10 MG PO TABS: 20.0000 mg | ORAL_TABLET | Freq: Every day | ORAL | 2 refills | Status: DC

## 2017-06-22 NOTE — Progress Notes (Signed)
Subjective:     Laurie Bell is a 37 y.o. female who presents to the clinic 8 weeks status post endometrial ablation for abnormal uterine bleeding. Eating a regular diet decreased appetite difficulty. Bowel movements are normal. she had some cramps yesterday when bleeding began  The following portions of the patient's history were reviewed and updated as appropriate: allergies, current medications, past family history, past medical history, past social history, past surgical history and problem list.  Review of Systems She had brown old blood discharge for several weeks after surgery    Objective:    BP 113/82   Pulse 77   Ht 5\' 6"  (1.676 m)   Wt 147 lb 12.8 oz (67 kg)   BMI 23.86 kg/m  General:  alert, cooperative and no distress  Abdomen: Not distended        Assessment:    Postoperative course complicated by vaginal bleeding Operative findings again reviewed. Pathology report discussed.    Plan:    1. Continue any current medications. 2. Wound care discussed. 3. Activity restrictions: none 4. Anticipated return to work: now. 5. Follow up: 8 weeks to see how she does with Provera 20 mg daily  Woodroe Mode, MD 06/22/2017

## 2017-06-22 NOTE — Patient Instructions (Signed)
Endometrial Ablation Endometrial ablation is a procedure that destroys the thin inner layer of the lining of the uterus (endometrium). This procedure may be done:  To stop heavy periods.  To stop bleeding that is causing anemia.  To control irregular bleeding.  To treat bleeding caused by small tumors (fibroids) in the endometrium.  This procedure is often an alternative to major surgery, such as removal of the uterus and cervix (hysterectomy). As a result of this procedure:  You may not be able to have children. However, if you are premenopausal (you have not gone through menopause): ? You may still have a small chance of getting pregnant. ? You will need to use a reliable method of birth control after the procedure to prevent pregnancy.  You may stop having a menstrual period, or you may have only a small amount of bleeding during your period. Menstruation may return several years after the procedure.  Tell a health care provider about:  Any allergies you have.  All medicines you are taking, including vitamins, herbs, eye drops, creams, and over-the-counter medicines.  Any problems you or family members have had with the use of anesthetic medicines.  Any blood disorders you have.  Any surgeries you have had.  Any medical conditions you have. What are the risks? Generally, this is a safe procedure. However, problems may occur, including:  A hole (perforation) in the uterus or bowel.  Infection of the uterus, bladder, or vagina.  Bleeding.  Damage to other structures or organs.  An air bubble in the lung (air embolus).  Problems with pregnancy after the procedure.  Failure of the procedure.  Decreased ability to diagnose cancer in the endometrium.  What happens before the procedure?  You will have tests of your endometrium to make sure there are no pre-cancerous cells or cancer cells present.  You may have an ultrasound of the uterus.  You may be given  medicines to thin the endometrium.  Ask your health care provider about: ? Changing or stopping your regular medicines. This is especially important if you take diabetes medicines or blood thinners. ? Taking medicines such as aspirin and ibuprofen. These medicines can thin your blood. Do not take these medicines before your procedure if your doctor tells you not to.  Plan to have someone take you home from the hospital or clinic. What happens during the procedure?  You will lie on an exam table with your feet and legs supported as in a pelvic exam.  To lower your risk of infection: ? Your health care team will wash or sanitize their hands and put on germ-free (sterile) gloves. ? Your genital area will be washed with soap.  An IV tube will be inserted into one of your veins.  You will be given a medicine to help you relax (sedative).  A surgical instrument with a light and camera (resectoscope) will be inserted into your vagina and moved into your uterus. This allows your surgeon to see inside your uterus.  Endometrial tissue will be removed using one of the following methods: ? Radiofrequency. This method uses a radiofrequency-alternating electric current to remove the endometrium. ? Cryotherapy. This method uses extreme cold to freeze the endometrium. ? Heated-free liquid. This method uses a heated saltwater (saline) solution to remove the endometrium. ? Microwave. This method uses high-energy microwaves to heat up the endometrium and remove it. ? Thermal balloon. This method involves inserting a catheter with a balloon tip into the uterus. The balloon tip is   filled with heated fluid to remove the endometrium. The procedure may vary among health care providers and hospitals. What happens after the procedure?  Your blood pressure, heart rate, breathing rate, and blood oxygen level will be monitored until the medicines you were given have worn off.  As tissue healing occurs, you may  notice vaginal bleeding for 4-6 weeks after the procedure. You may also experience: ? Cramps. ? Thin, watery vaginal discharge that is light pink or brown in color. ? A need to urinate more frequently than usual. ? Nausea.  Do not drive for 24 hours if you were given a sedative.  Do not have sex or insert anything into your vagina until your health care provider approves. Summary  Endometrial ablation is done to treat the many causes of heavy menstrual bleeding.  The procedure may be done only after medications have been tried to control the bleeding.  Plan to have someone take you home from the hospital or clinic. This information is not intended to replace advice given to you by your health care provider. Make sure you discuss any questions you have with your health care provider. Document Released: 04/07/2004 Document Revised: 06/15/2016 Document Reviewed: 06/15/2016 Elsevier Interactive Patient Education  2017 [employer]  

## 2017-08-09 ENCOUNTER — Ambulatory Visit (HOSPITAL_COMMUNITY)
Admission: EM | Admit: 2017-08-09 | Discharge: 2017-08-09 | Disposition: A | Discharge status: Home / Self Care | Payer: Self-pay | Attending: Emergency Medicine | Admitting: Emergency Medicine

## 2017-08-09 ENCOUNTER — Other Ambulatory Visit: Payer: Self-pay

## 2017-08-09 ENCOUNTER — Encounter (HOSPITAL_COMMUNITY): Payer: Self-pay | Admitting: Emergency Medicine

## 2017-08-09 DIAGNOSIS — K0889 Other specified disorders of teeth and supporting structures: Secondary | ICD-10-CM

## 2017-08-09 DIAGNOSIS — R591 Generalized enlarged lymph nodes: Secondary | ICD-10-CM

## 2017-08-09 MED ORDER — AMOXICILLIN-POT CLAVULANATE 875-125 MG PO TABS: 1.0000 | ORAL_TABLET | Freq: Two times a day (BID) | ORAL | 0 refills | Status: AC

## 2017-08-09 NOTE — ED Triage Notes (Signed)
Pt first noticed a bump on her L ear and L side of her jaw. Bump is not red. Tender to palpation.

## 2017-08-09 NOTE — ED Provider Notes (Signed)
[facility]    CSN: [id number] Arrival date & time: 08/09/17  1629     History   Chief Complaint Chief Complaint  Patient presents with  . Mass    HPI Laurie Bell is a 37 y.o. female.   Laurie presents with complaints of swollen lymph nodes to right ear which she notice yesterday. She states she has right sided head pain and jaw pain. Denies  Congestion, cough, fevers, sore throat. Mild right ear pain. Denies any previous similar. She has a broken molar to right lower jaw which is painful over the past few days. Pain is 8/10. Worse if touched. Does not take any medications regularly.    ROS per HPI.       Past Medical History:  Diagnosis Date  . Chlamydia contact, treated   . No pertinent past medical history     Patient Active Problem List   Diagnosis Date Noted  . DUB (dysfunctional uterine bleeding) 01/12/2017  . 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 ENDOMETRIAL ABLATION N/A 04/25/2017   Procedure: ENDOMETRIAL ABLATION With NOVASURE;  Surgeon: Woodroe Mode, MD;  Location: [facility];  Service: Gynecology;  Laterality: N/A;  . TUBAL LIGATION      OB History    Gravida Para Term Preterm AB Living   2 2 2  0 0 2   SAB TAB Ectopic Multiple Live Births   0 0 0 0         Home Medications    Prior to Admission medications   Medication Sig Start Date End Date Taking? Authorizing Provider  amoxicillin-clavulanate (AUGMENTIN) 875-125 MG tablet Take 1 tablet by mouth every 12 (twelve) hours for 10 days. 08/09/17 08/19/17  Zigmund Gottron, NP    Family History Family History  Problem Relation Age of Onset  . Hypertension Mother     Social History Social History   Tobacco Use  . Smoking status: Former Research scientist (life sciences)  . Smokeless tobacco: Never Used  . Tobacco comment: Quit  7-8 years ago  Substance Use Topics  . Alcohol use: No  . Drug use: No     Allergies    Latex   Review of Systems Review of Systems   Physical Exam Triage Vital Signs ED Triage Vitals [08/09/17 1714]  Enc Vitals Group     BP 112/73     Pulse Rate 86     Resp 18     Temp 98 F (36.7 C)     Temp src      SpO2 100 %     Weight      Height      Head Circumference      Peak Flow      Pain Score 8     Pain Loc      Pain Edu?      Excl. in [place]?    No data found.  Updated Vital Signs BP 112/73   Pulse 86   Temp 98 F (36.7 C)   Resp 18   SpO2 100%   Visual Acuity Right Eye Distance:   Left Eye Distance:   Bilateral Distance:    Right Eye Near:   Left Eye Near:    Bilateral Near:     Physical Exam  Constitutional: She is oriented to person, place, and time. She appears well-developed and well-nourished. No distress.  HENT:  Head: Normocephalic and atraumatic.  Right Ear: Tympanic membrane,  external ear and ear canal normal.  Left Ear: Tympanic membrane, external ear and ear canal normal.  Nose: Nose normal.  Mouth/Throat: Uvula is midline, oropharynx is clear and moist and mucous membranes are normal. No tonsillar exudate.    Right lower jaw last molar completely broken off to gumline with tenderness at jaw line on palpation   Eyes: Conjunctivae and EOM are normal. Pupils are equal, round, and reactive to light.  Cardiovascular: Normal rate, regular rhythm and normal heart sounds.  Pulmonary/Chest: Effort normal and breath sounds normal.  Lymphadenopathy:       Head (right side): Submandibular and preauricular adenopathy present.  Neurological: She is alert and oriented to person, place, and time.  Skin: Skin is warm and dry.     UC Treatments / Results  Labs (all labs ordered are listed, but only abnormal results are displayed) Labs Reviewed - No data to display  EKG  EKG Interpretation None       Radiology No results found.  Procedures Procedures (including critical care time)  Medications Ordered in UC Medications - No  data to display   Initial Impression / Assessment and Plan / UC Course  I have reviewed the triage vital signs and the nursing notes.  Pertinent labs & imaging results that were available during my care of the patient were reviewed by me and considered in my medical decision making (see chart for details).     Lymphadenopathy in presence of increased dental pain. Will treat for concern for dental infection at this time. Encouraged follow up with dentist for definitive treatment. Patient verbalized understanding and agreeable to plan.    Final Clinical Impressions(s) / UC Diagnoses   Final diagnoses:  Pain, dental  Lymphadenopathy    ED Discharge Orders        Ordered    amoxicillin-clavulanate (AUGMENTIN) 875-125 MG tablet  Every 12 hours     08/09/17 1830       Controlled Substance Prescriptions [place] Controlled Substance Registry consulted? Not Applicable   Zigmund Gottron, NP 08/09/17 1836

## 2017-08-09 NOTE — Discharge Instructions (Signed)
Complete course of antibiotics.  Tylenol and/or ibuprofen as needed for pain or fevers.  Please follow up with a dentist for definitive treatment of right lower molar.

## 2017-08-20 ENCOUNTER — Ambulatory Visit: Payer: Self-pay | Admitting: Obstetrics & Gynecology

## 2018-01-09 ENCOUNTER — Other Ambulatory Visit: Payer: Self-pay

## 2018-01-09 ENCOUNTER — Emergency Department (HOSPITAL_COMMUNITY)
Admission: EM | Admit: 2018-01-09 | Discharge: 2018-01-09 | Disposition: A | Discharge status: Home / Self Care | Payer: Self-pay | Attending: Emergency Medicine | Admitting: Emergency Medicine

## 2018-01-09 ENCOUNTER — Encounter (HOSPITAL_COMMUNITY): Payer: Self-pay | Admitting: Emergency Medicine

## 2018-01-09 ENCOUNTER — Emergency Department (HOSPITAL_COMMUNITY): Payer: Self-pay

## 2018-01-09 DIAGNOSIS — Z87891 Personal history of nicotine dependence: Secondary | ICD-10-CM | POA: Insufficient documentation

## 2018-01-09 DIAGNOSIS — R079 Chest pain, unspecified: Secondary | ICD-10-CM

## 2018-01-09 DIAGNOSIS — Z91040 Latex allergy status: Secondary | ICD-10-CM | POA: Insufficient documentation

## 2018-01-09 DIAGNOSIS — R0789 Other chest pain: Secondary | ICD-10-CM | POA: Insufficient documentation

## 2018-01-09 DIAGNOSIS — R112 Nausea with vomiting, unspecified: Secondary | ICD-10-CM | POA: Insufficient documentation

## 2018-01-09 LAB — CBC WITH DIFFERENTIAL/PLATELET
Basophils Absolute: 0 10*3/uL (ref 0.0–0.1)
Basophils Relative: 1 %
Eosinophils Absolute: 0.2 10*3/uL (ref 0.0–0.7)
Eosinophils Relative: 3 %
HCT: 39.3 % (ref 36.0–46.0)
Hemoglobin: 13.3 g/dL (ref 12.0–15.0)
Lymphocytes Relative: 36 %
Lymphs Abs: 2.9 10*3/uL (ref 0.7–4.0)
MCH: 30.3 pg (ref 26.0–34.0)
MCHC: 33.8 g/dL (ref 30.0–36.0)
MCV: 89.5 fL (ref 78.0–100.0)
Monocytes Absolute: 0.6 10*3/uL (ref 0.1–1.0)
Monocytes Relative: 8 %
Neutro Abs: 4.1 10*3/uL (ref 1.7–7.7)
Neutrophils Relative %: 52 %
Platelets: 239 10*3/uL (ref 150–400)
RBC: 4.39 MIL/uL (ref 3.87–5.11)
RDW: 12.9 % (ref 11.5–15.5)
WBC: 7.8 10*3/uL (ref 4.0–10.5)

## 2018-01-09 LAB — I-STAT BETA HCG BLOOD, ED (MC, WL, AP ONLY): I-stat hCG, quantitative: <5 m[IU]/mL (ref <5)

## 2018-01-09 LAB — BASIC METABOLIC PANEL
Anion gap: 8 (ref 5–15)
BUN: 10 mg/dL (ref 6–20)
CO2: 27 mmol/L (ref 22–32)
Calcium: 9.1 mg/dL (ref 8.9–10.3)
Chloride: 106 mmol/L (ref 98–111)
Creatinine, Ser: 0.72 mg/dL (ref 0.44–1.00)
GFR calc Af Amer: >60 mL/min (ref >60)
GFR calc non Af Amer: >60 mL/min (ref >60)
Glucose, Bld: 99 mg/dL (ref 70–99)
Potassium: 3.4 mmol/L — ABNORMAL LOW (ref 3.5–5.1)
Sodium: 141 mmol/L (ref 135–145)

## 2018-01-09 LAB — D-DIMER, QUANTITATIVE: D-Dimer, Quant: 0.33 ug/mL-FEU (ref 0.00–0.50)

## 2018-01-09 LAB — I-STAT TROPONIN, ED: Troponin i, poc: 0 ng/mL (ref 0.00–0.08)

## 2018-01-09 MED ORDER — KETOROLAC TROMETHAMINE 30 MG/ML IJ SOLN
15.0000 mg | Freq: Once | INTRAMUSCULAR | Status: AC
  Administered 2018-01-09: 15 mg via INTRAVENOUS
  Filled 2018-01-09: qty 1

## 2018-01-09 MED ORDER — GI COCKTAIL ~~LOC~~
30.0000 mL | Freq: Once | ORAL | Status: AC
  Administered 2018-01-09: 30 mL via ORAL
  Filled 2018-01-09: qty 30

## 2018-01-09 MED ORDER — ONDANSETRON HCL 4 MG/2ML IJ SOLN
4.0000 mg | Freq: Once | INTRAMUSCULAR | Status: AC
  Administered 2018-01-09: 4 mg via INTRAVENOUS
  Filled 2018-01-09: qty 2

## 2018-01-09 NOTE — ED Provider Notes (Signed)
[facility] Provider Note   CSN: [id number] Arrival date & time: 01/09/18  0555     History   Chief Complaint Chief Complaint  Patient presents with  . Chest Pain    HPI Laurie Bell is a 37 y.o. female.  37 year old female with prior medical history is below presents with complaint of left-sided chest discomfort.  Patient reports that she was awakened from sleep this morning with left-sided chest pain.  The pain radiated to the left shoulder and left upper back.  She reports associated nausea and vomiting.  She reports that the pain is worse with deep breath.  She denies associated fever or shortness of breath.  She denies prior episodes of similar symptoms.  She took some Alka-Seltzer at home without improvement in her symptoms prior to arrival.   The history is provided by the patient and medical records.  Chest Pain   This is a new problem. The current episode started 1 to 2 hours ago. The problem occurs rarely. The problem has been gradually improving. The pain is associated with breathing. The pain is present in the lateral region. The pain is mild. The quality of the pain is described as sharp and stabbing. The pain does not radiate. Duration of episode(s) is 1 hour. Associated symptoms include back pain. Pertinent negatives include no fever and no shortness of breath. She has tried nothing for the symptoms. The treatment provided no relief.    Past Medical History:  Diagnosis Date  . Chlamydia contact, treated   . No pertinent past medical history     Patient Active Problem List   Diagnosis Date Noted  . DUB (dysfunctional uterine bleeding) 01/12/2017  . 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 ENDOMETRIAL ABLATION N/A 04/25/2017   Procedure: ENDOMETRIAL ABLATION With NOVASURE;  Surgeon: Woodroe Mode, MD;  Location: [facility];  Service: Gynecology;  Laterality: N/A;   . TUBAL LIGATION       OB History    Gravida  2   Para  2   Term  2   Preterm  0   AB  0   Living  2     SAB  0   TAB  0   Ectopic  0   Multiple  0   Live Births               Home Medications    Prior to Admission medications   Not on File    Family History Family History  Problem Relation Age of Onset  . Hypertension Mother     Social History Social History   Tobacco Use  . Smoking status: Former Research scientist (life sciences)  . Smokeless tobacco: Never Used  . Tobacco comment: Quit  7-8 years ago  Substance Use Topics  . Alcohol use: No  . Drug use: No     Allergies   Latex   Review of Systems Review of Systems  Constitutional: Negative for fever.  Respiratory: Negative for shortness of breath.   Cardiovascular: Positive for chest pain.  Musculoskeletal: Positive for back pain.  All other systems reviewed and are negative.    Physical Exam Updated Vital Signs BP (!) 113/92   Pulse 79   Temp 98.9 F (37.2 C) (Oral)   Resp 15   Ht 5\' 6"  (1.676 m)   Wt 68 kg (150 lb)   LMP 01/01/2018 (Approximate)   SpO2 100%  BMI 24.21 kg/m   Physical Exam  Constitutional: She is oriented to person, place, and time. She appears well-developed and well-nourished. No distress.  HENT:  Head: Normocephalic and atraumatic.  Mouth/Throat: Oropharynx is clear and moist.  Eyes: Pupils are equal, round, and reactive to light. Conjunctivae and EOM are normal.  Neck: Normal range of motion. Neck supple.  Cardiovascular: Normal rate, regular rhythm and normal heart sounds.  Mild tenderness with palpation to anterior left chest wall - does not fully reproduce patients reported pain   Pulmonary/Chest: Effort normal and breath sounds normal. No respiratory distress.  Abdominal: Soft. She exhibits no distension. There is no tenderness.  Musculoskeletal: Normal range of motion. She exhibits no edema or deformity.  Neurological: She is alert and oriented to person, place,  and time.  Skin: Skin is warm and dry.  Psychiatric: She has a normal mood and affect.  Nursing note and vitals reviewed.    ED Treatments / Results  Labs (all labs ordered are listed, but only abnormal results are displayed) Labs Reviewed  BASIC METABOLIC PANEL - Abnormal; Notable for the following components:      Result Value   Potassium 3.4 (*)    All other components within normal limits  CBC WITH DIFFERENTIAL/PLATELET  D-DIMER, QUANTITATIVE (NOT AT [facility])  I-STAT TROPONIN, ED  I-STAT BETA HCG BLOOD, ED ([facility], [facility], [facility] ONLY)    EKG EKG Interpretation  Date/Time:  Wednesday January 09 2018 06:03:31 EDT Ventricular Rate:  66 PR Interval:    QRS Duration: 82 QT Interval:  392 QTC Calculation: 411 R Axis:   67 Text Interpretation:  Sinus rhythm Probable anteroseptal infarct, old Confirmed by Dene Gentry [phone]) on 01/09/2018 6:50:16 AM   Radiology No results found.  Procedures Procedures (including critical care time)  Medications Ordered in ED Medications  ondansetron (ZOFRAN) injection 4 mg (has no administration in time range)  ketorolac (TORADOL) 30 MG/ML injection 15 mg (has no administration in time range)     Initial Impression / Assessment and Plan / ED Course  I have reviewed the triage vital signs and the nursing notes.  Pertinent labs & imaging results that were available during my care of the patient were reviewed by me and considered in my medical decision making (see chart for details).     MDM   Screen complete  Patient is presenting for evaluation of atypical chest pain with associated nausea and vomiting.  Patient's symptoms are most likely reflective of a degree of gastritis.  Symptoms are significantly improved following medication in the ED.  Screening labs obtained thus far are without evidence of significant acute abnormality.  Chest x-ray was without abnormality.  EKG does not show acute ischemia.  Troponin and d-dimer were  negative.  Patient desires discharge home.  Strict return precautions given and understood.  Importance of close follow-up stressed.  Final Clinical Impressions(s) / ED Diagnoses   Final diagnoses:  Nausea and vomiting, intractability of vomiting not specified, unspecified vomiting type  Nonspecific chest pain    ED Discharge Orders    None       Valarie Merino, MD 01/09/18 [phone]

## 2018-01-09 NOTE — Discharge Instructions (Addendum)
Please return for any problem.  Follow-up with your regular doctor as instructed. °

## 2018-01-09 NOTE — ED Triage Notes (Signed)
Pt complaining of left sided chest pain that radiates to left arm and back. Pt reports pain has been present x1 hour

## 2018-02-04 ENCOUNTER — Telehealth: Payer: Self-pay | Admitting: *Deleted

## 2018-02-04 MED ORDER — MEGESTROL ACETATE 40 MG PO TABS: ORAL_TABLET | ORAL | 1 refills | Status: DC

## 2018-02-04 NOTE — Telephone Encounter (Addendum)
Called pt due to message received from [place] stating that pt desired Rx to keep her from bleeding until her scheduled office appt 9/26. Per consult w/Marie Jimmye Norman, CNM Rx for Megace sent to pharmacy on file. Attempted to call pt and no answer - unable to leave voice mail.    8/28  1415  Called pt and informed her of Rx sent to her pharmacy.  Pt stated that she has had this medication previously and it does not stop her bleeding. She requests the same medication that Dr. Roselie Awkward gave her @ last visit (Provera). Pt was advised that I will call her back once I have talked with the doctor. I consulted with Dr. Rosana Hoes and Rx for 1 month supply of Provera was approved.  I called pt back and left message on her voice mail stating Rx has been sent as requested. She must keep appt as scheduled on 9/26 for Gyn follow up as no additional refills can be given until she has office appt.

## 2018-02-06 MED ORDER — MEDROXYPROGESTERONE ACETATE 10 MG PO TABS: ORAL_TABLET | ORAL | 0 refills | Status: DC

## 2018-03-07 ENCOUNTER — Ambulatory Visit: Payer: Self-pay | Admitting: Obstetrics & Gynecology

## 2018-06-07 ENCOUNTER — Emergency Department (HOSPITAL_COMMUNITY)
Admission: EM | Admit: 2018-06-07 | Discharge: 2018-06-07 | Disposition: A | Discharge status: Home / Self Care | Payer: Self-pay | Attending: Emergency Medicine | Admitting: Emergency Medicine

## 2018-06-07 ENCOUNTER — Encounter (HOSPITAL_COMMUNITY): Payer: Self-pay | Admitting: Emergency Medicine

## 2018-06-07 DIAGNOSIS — R6889 Other general symptoms and signs: Secondary | ICD-10-CM

## 2018-06-07 DIAGNOSIS — J069 Acute upper respiratory infection, unspecified: Secondary | ICD-10-CM | POA: Insufficient documentation

## 2018-06-07 DIAGNOSIS — Z91040 Latex allergy status: Secondary | ICD-10-CM | POA: Insufficient documentation

## 2018-06-07 DIAGNOSIS — Z79899 Other long term (current) drug therapy: Secondary | ICD-10-CM | POA: Insufficient documentation

## 2018-06-07 DIAGNOSIS — Z87891 Personal history of nicotine dependence: Secondary | ICD-10-CM | POA: Insufficient documentation

## 2018-06-07 NOTE — ED Notes (Signed)
Bed: WTR5 Expected date:  Expected time:  Means of arrival:  Comments: 

## 2018-06-07 NOTE — ED Triage Notes (Signed)
Pt c/o cough, body aches, ear pain for 2-3 days. Reports OTC medications arent helping.

## 2018-06-07 NOTE — ED Provider Notes (Signed)
[facility] DEPT Provider Note   CSN: [id number] Arrival date & time: 06/07/18  1023     History   Chief Complaint Chief Complaint  Patient presents with  . Cough  . Generalized Body Aches    HPI Laurie Bell is a 37 y.o. female with a PMHx of uterine fibroids, who presents to the ED with complaints of URI symptoms that began 2 to 3 days ago.  Her symptoms include cough with clear sputum production, body aches, chills, rhinorrhea, and left otalgia.  She has tried over-the-counter cold and flu medicines without relief of her symptoms, no known aggravating factors.  She reports multiple sick contacts at work.  She did not receive a flu shot this year.  She denies any fevers, sore throat, CP, SOB, abd pain, N/V/D/C, hematuria, dysuria, arthralgias, numbness, tingling, focal weakness, or any other complaints at this time.   The history is provided by the patient and medical records. No language interpreter was used.  Cough  Associated symptoms include chills, ear pain, rhinorrhea and myalgias. Pertinent negatives include no chest pain, no sore throat and no shortness of breath.    Past Medical History:  Diagnosis Date  . Chlamydia contact, treated   . No pertinent past medical history     Patient Active Problem List   Diagnosis Date Noted  . DUB (dysfunctional uterine bleeding) 01/12/2017  . 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 ENDOMETRIAL ABLATION N/A 04/25/2017   Procedure: ENDOMETRIAL ABLATION With NOVASURE;  Surgeon: Woodroe Mode, MD;  Location: [facility];  Service: Gynecology;  Laterality: N/A;  . TUBAL LIGATION       OB History    Gravida  2   Para  2   Term  2   Preterm  0   AB  0   Living  2     SAB  0   TAB  0   Ectopic  0   Multiple  0   Live Births               Home Medications    Prior to Admission medications   Medication Sig  Start Date End Date Taking? Authorizing Provider  acetaminophen (TYLENOL) 500 MG tablet Take 1,000 mg by mouth every 6 (six) hours as needed for moderate pain.    [provider]  medroxyPROGESTERone (PROVERA) 10 MG tablet Take 2 tablets by mouth daily 02/06/18   Sloan Leiter, MD    Family History Family History  Problem Relation Age of Onset  . Hypertension Mother     Social History Social History   Tobacco Use  . Smoking status: Former Research scientist (life sciences)  . Smokeless tobacco: Never Used  . Tobacco comment: Quit  7-8 years ago  Substance Use Topics  . Alcohol use: No  . Drug use: No     Allergies   Latex   Review of Systems Review of Systems  Constitutional: Positive for chills. Negative for fever.  HENT: Positive for ear pain and rhinorrhea. Negative for sore throat.   Respiratory: Positive for cough. Negative for shortness of breath.   Cardiovascular: Negative for chest pain.  Gastrointestinal: Negative for abdominal pain, constipation, diarrhea, nausea and vomiting.  Genitourinary: Negative for dysuria and hematuria.  Musculoskeletal: Positive for myalgias. Negative for arthralgias.  Skin: Negative for color change.  Allergic/Immunologic: Negative for immunocompromised state.  Neurological: Negative for weakness and numbness.  Psychiatric/Behavioral: Negative  for confusion.   All other systems reviewed and are negative for acute change except as noted in the HPI.    Physical Exam Updated Vital Signs BP 123/88 (BP Location: Left Arm)   Pulse 92   Temp 98.9 F (37.2 C) (Oral)   Resp 18   SpO2 100%   Physical Exam Vitals signs and nursing note reviewed.  Constitutional:      General: She is not in acute distress.    Appearance: Normal appearance. She is well-developed. She is not toxic-appearing.     Comments: Afebrile, nontoxic, NAD  HENT:     Head: Normocephalic and atraumatic.     Right Ear: Hearing, tympanic membrane, ear canal and external ear normal.       Left Ear: Hearing, tympanic membrane, ear canal and external ear normal.     Nose: Congestion present.     Mouth/Throat:     Mouth: Mucous membranes are moist.     Pharynx: Oropharynx is clear. Uvula midline. No pharyngeal swelling or oropharyngeal exudate.     Tonsils: No tonsillar exudate or tonsillar abscesses.     Comments: Ears are clear bilaterally. Nose congested. Oropharynx clear and moist, without uvular swelling or deviation, no trismus or drooling, no tonsillar swelling or erythema, no exudates.   Eyes:     General:        Right eye: No discharge.        Left eye: No discharge.     Conjunctiva/sclera: Conjunctivae normal.  Neck:     Musculoskeletal: Normal range of motion and neck supple.  Cardiovascular:     Rate and Rhythm: Normal rate and regular rhythm.     Heart sounds: Normal heart sounds, S1 normal and S2 normal. No murmur. No friction rub. No gallop.   Pulmonary:     Effort: Pulmonary effort is normal. No respiratory distress.     Breath sounds: Normal breath sounds. No decreased breath sounds, wheezing, rhonchi or rales.     Comments: CTAB in all lung fields, no w/r/r, no hypoxia or increased WOB, speaking in full sentences, SpO2 100% on RA  Abdominal:     General: Bowel sounds are normal. There is no distension.     Palpations: Abdomen is soft. Abdomen is not rigid.     Tenderness: There is no abdominal tenderness. There is no right CVA tenderness, left CVA tenderness, guarding or rebound. Negative signs include Murphy's sign and McBurney's sign.  Musculoskeletal: Normal range of motion.  Skin:    General: Skin is warm and dry.     Findings: No rash.  Neurological:     Mental Status: She is alert and oriented to person, place, and time.     Sensory: Sensation is intact. No sensory deficit.     Motor: Motor function is intact.  Psychiatric:        Mood and Affect: Mood and affect normal.        Behavior: Behavior normal.      ED Treatments / Results   Labs (all labs ordered are listed, but only abnormal results are displayed) Labs Reviewed - No data to display  EKG None  Radiology No results found.  Procedures Procedures (including critical care time)  Medications Ordered in ED Medications - No data to display   Initial Impression / Assessment and Plan / ED Course  I have reviewed the triage vital signs and the nursing notes.  Pertinent labs & imaging results that were available during my care of  the patient were reviewed by me and considered in my medical decision making (see chart for details).     37 y.o. female here with URI symptoms. Pt is afebrile with a clear lung exam. Mild nasal congestion; ears clear, throat clear. Likely viral URI. Doubt need for labs/imaging. Too late for tamiflu. Pt is agreeable to symptomatic treatment with close follow up with PCP as needed but spoke at length about emergent changing or worsening of symptoms that should prompt return to ER. Pt voices understanding and is agreeable to plan. Stable at time of discharge.    Final Clinical Impressions(s) / ED Diagnoses   Final diagnoses:  Viral upper respiratory tract infection  Flu-like symptoms    ED Discharge Orders    [address] Reckner, [place], [place] 06/07/18 [place], Kevin, MD 06/07/18 1627

## 2018-06-07 NOTE — Discharge Instructions (Signed)
Continue to stay well-hydrated. Gargle warm salt water and spit it out and use chloraseptic spray as needed for sore throat. Continue to alternate between Tylenol and Ibuprofen for pain or fever. Use Mucinex/Robitussin/etc for cough suppression/expectoration of mucus. Use over the counter flonase and the netipot to help with nasal congestion. May consider over-the-counter Benadryl or other antihistamine like Claritin/Zyrtec/etc to decrease secretions and for help with your symptoms. Follow up with your primary care doctor in 5-7 days for recheck of ongoing symptoms. Return to emergency department for emergent changing or worsening of symptoms. °

## 2018-06-07 NOTE — ED Notes (Signed)
Pt d/c home per MD order. Discharge summary reviewed with pt. Pt verbalizes understanding. Ambulatory off unit.

## 2018-12-02 ENCOUNTER — Emergency Department (HOSPITAL_BASED_OUTPATIENT_CLINIC_OR_DEPARTMENT_OTHER)
Admission: EM | Admit: 2018-12-02 | Discharge: 2018-12-03 | Disposition: A | Discharge status: Home / Self Care | Payer: Self-pay | Attending: Emergency Medicine | Admitting: Emergency Medicine

## 2018-12-02 ENCOUNTER — Encounter (HOSPITAL_BASED_OUTPATIENT_CLINIC_OR_DEPARTMENT_OTHER): Payer: Self-pay

## 2018-12-02 ENCOUNTER — Other Ambulatory Visit: Payer: Self-pay

## 2018-12-02 DIAGNOSIS — Z87891 Personal history of nicotine dependence: Secondary | ICD-10-CM | POA: Insufficient documentation

## 2018-12-02 DIAGNOSIS — N10 Acute tubulo-interstitial nephritis: Secondary | ICD-10-CM | POA: Insufficient documentation

## 2018-12-02 DIAGNOSIS — Z79899 Other long term (current) drug therapy: Secondary | ICD-10-CM | POA: Insufficient documentation

## 2018-12-02 DIAGNOSIS — Z91040 Latex allergy status: Secondary | ICD-10-CM | POA: Insufficient documentation

## 2018-12-02 LAB — URINALYSIS, ROUTINE W REFLEX MICROSCOPIC
Bilirubin Urine: NEGATIVE
Glucose, UA: NEGATIVE mg/dL
Ketones, ur: NEGATIVE mg/dL
Nitrite: POSITIVE — AB
Protein, ur: 100 mg/dL — AB
Specific Gravity, Urine: 1.025 (ref 1.005–1.030)
pH: 6 (ref 5.0–8.0)

## 2018-12-02 LAB — URINALYSIS, MICROSCOPIC (REFLEX): WBC, UA: >50 WBC/hpf (ref 0–5)

## 2018-12-02 LAB — PREGNANCY, URINE: Preg Test, Ur: NEGATIVE

## 2018-12-02 MED ORDER — SODIUM CHLORIDE 0.9 % IV BOLUS
1000.0000 mL | Freq: Once | INTRAVENOUS | Status: AC
  Administered 2018-12-02: 1000 mL via INTRAVENOUS

## 2018-12-02 MED ORDER — ACETAMINOPHEN 325 MG PO TABS
650.0000 mg | ORAL_TABLET | Freq: Once | ORAL | Status: AC
  Administered 2018-12-02: 650 mg via ORAL
  Filled 2018-12-02: qty 2

## 2018-12-02 MED ORDER — ACETAMINOPHEN 500 MG PO TABS
1000.0000 mg | ORAL_TABLET | Freq: Once | ORAL | Status: AC
  Administered 2018-12-02: 1000 mg via ORAL
  Filled 2018-12-02: qty 2

## 2018-12-02 MED ORDER — SODIUM CHLORIDE 0.9 % IV SOLN
1.0000 g | Freq: Once | INTRAVENOUS | Status: AC
  Administered 2018-12-02: 1 g via INTRAVENOUS
  Filled 2018-12-02: qty 10

## 2018-12-02 MED ORDER — KETOROLAC TROMETHAMINE 30 MG/ML IJ SOLN
30.0000 mg | Freq: Once | INTRAMUSCULAR | Status: AC
  Administered 2018-12-02: 30 mg via INTRAVENOUS
  Filled 2018-12-02: qty 1

## 2018-12-02 NOTE — ED Provider Notes (Signed)
[facility] EMERGENCY DEPARTMENT Provider Note   CSN: [id number] Arrival date & time: 12/02/18  1854     History   Chief Complaint Chief Complaint  Patient presents with  . Abdominal Pain    HPI Laurie Bell is a 38 y.o. female.     Patient c/o left flank pain for the past couple of days. Symptoms gradual onset, mild at onset, constant, slowly worse, non radiating, now moderate. C/o urinary urgency, frequency at onset. Hx uti/pyelo - no recent abx rx. Fever onset today. Denies chills/sweats. No nv. Pt states does not feel sick or ill. Normal appetite. lnmp 1 month ago. Pt denies any unusual vaginal discharge or bleeding. No pelvic pain. C/o left flank pain posteriorly.   The history is provided by the patient.  Abdominal Pain Associated symptoms: fever   Associated symptoms: no chest pain, no chills, no cough, no shortness of breath, no sore throat, no vaginal bleeding, no vaginal discharge and no vomiting     Past Medical History:  Diagnosis Date  . Chlamydia contact, treated   . No pertinent past medical history     Patient Active Problem List   Diagnosis Date Noted  . DUB (dysfunctional uterine bleeding) 01/12/2017  . 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 ENDOMETRIAL ABLATION N/A 04/25/2017   Procedure: ENDOMETRIAL ABLATION With NOVASURE;  Surgeon: Woodroe Mode, MD;  Location: [facility];  Service: Gynecology;  Laterality: N/A;  . TUBAL LIGATION       OB History    Gravida  2   Para  2   Term  2   Preterm  0   AB  0   Living  2     SAB  0   TAB  0   Ectopic  0   Multiple  0   Live Births               Home Medications    Prior to Admission medications   Medication Sig Start Date End Date Taking? Authorizing Provider  acetaminophen (TYLENOL) 500 MG tablet Take 1,000 mg by mouth every 6 (six) hours as needed for moderate pain.    [provider]    medroxyPROGESTERone (PROVERA) 10 MG tablet Take 2 tablets by mouth daily 02/06/18   Sloan Leiter, MD    Family History Family History  Problem Relation Age of Onset  . Hypertension Mother     Social History Social History   Tobacco Use  . Smoking status: Former Research scientist (life sciences)  . Smokeless tobacco: Never Used  Substance Use Topics  . Alcohol use: No  . Drug use: No     Allergies   Latex   Review of Systems Review of Systems  Constitutional: Positive for fever. Negative for chills.  HENT: Negative for sore throat.   Eyes: Negative for redness.  Respiratory: Negative for cough and shortness of breath.   Cardiovascular: Negative for chest pain.  Gastrointestinal: Negative for vomiting.  Genitourinary: Positive for flank pain and urgency. Negative for vaginal bleeding and vaginal discharge.  Musculoskeletal: Positive for back pain. Negative for neck pain.  Skin: Negative for rash.  Neurological: Negative for headaches.  Hematological: Does not bruise/bleed easily.  Psychiatric/Behavioral: Negative for confusion.     Physical Exam Updated Vital Signs BP 113/78 (BP Location: Left Arm)   Pulse (!) 129   Temp (!) 103 F (39.4 C) (Oral)   Resp 20  SpO2 98%   Physical Exam Vitals signs and nursing note reviewed.  Constitutional:      Appearance: Normal appearance. She is well-developed.  HENT:     Head: Atraumatic.     Nose: Nose normal.     Mouth/Throat:     Mouth: Mucous membranes are moist.  Eyes:     General: No scleral icterus.    Conjunctiva/sclera: Conjunctivae normal.  Neck:     Musculoskeletal: Normal range of motion and neck supple. No neck rigidity or muscular tenderness.     Trachea: No tracheal deviation.  Cardiovascular:     Rate and Rhythm: Regular rhythm. Tachycardia present.     Pulses: Normal pulses.     Heart sounds: Normal heart sounds. No murmur. No friction rub. No gallop.   Pulmonary:     Effort: Pulmonary effort is normal. No respiratory  distress.     Breath sounds: Normal breath sounds.  Abdominal:     General: Bowel sounds are normal. There is no distension.     Palpations: Abdomen is soft. There is no mass.     Tenderness: There is no abdominal tenderness. There is left CVA tenderness. There is no guarding or rebound.     Hernia: No hernia is present.  Genitourinary:    Comments: Left cva tenderness.   Musculoskeletal:        General: No swelling.     Comments: T/L spine non tender, aligned.   Skin:    General: Skin is warm and dry.     Findings: No rash.  Neurological:     Mental Status: She is alert.     Comments: Alert, speech normal.   Psychiatric:        Mood and Affect: Mood normal.      ED Treatments / Results  Labs (all labs ordered are listed, but only abnormal results are displayed) Results for orders placed or performed during the hospital encounter of 12/02/18  Urinalysis, Routine w reflex microscopic  Result Value Ref Range   Color, Urine YELLOW YELLOW   APPearance CLOUDY (A) CLEAR   Specific Gravity, Urine 1.025 1.005 - 1.030   pH 6.0 5.0 - 8.0   Glucose, UA NEGATIVE NEGATIVE mg/dL   Hgb urine dipstick LARGE (A) NEGATIVE   Bilirubin Urine NEGATIVE NEGATIVE   Ketones, ur NEGATIVE NEGATIVE mg/dL   Protein, ur 100 (A) NEGATIVE mg/dL   Nitrite POSITIVE (A) NEGATIVE   Leukocytes,Ua MODERATE (A) NEGATIVE  Pregnancy, urine  Result Value Ref Range   Preg Test, Ur NEGATIVE NEGATIVE  Urinalysis, Microscopic (reflex)  Result Value Ref Range   RBC / HPF 21-50 0 - 5 RBC/hpf   WBC, UA >50 0 - 5 WBC/hpf   Bacteria, UA MANY (A) NONE SEEN   Squamous Epithelial / LPF 6-10 0 - 5   Mucus PRESENT    EKG    Radiology No results found.  Procedures Procedures (including critical care time)  Medications Ordered in ED Medications  cefTRIAXone (ROCEPHIN) 1 g in sodium chloride 0.9 % 100 mL IVPB (has no administration in time range)  ketorolac (TORADOL) 30 MG/ML injection 30 mg (has no  administration in time range)  sodium chloride 0.9 % bolus 1,000 mL (has no administration in time range)  acetaminophen (TYLENOL) tablet 1,000 mg (has no administration in time range)  acetaminophen (TYLENOL) tablet 650 mg (650 mg Oral Given 12/02/18 1911)     Initial Impression / Assessment and Plan / ED Course  I have reviewed  the triage vital signs and the nursing notes.  Pertinent labs & imaging results that were available during my care of the patient were reviewed by me and considered in my medical decision making (see chart for details).  Labs sent.  Iv ns bolus. Acetaminophen po.   Labs reviewed by me - c/w uti with many bacteria, nitrite and le positive, many wbc.   Rocephin iv.   Reviewed nursing notes and prior charts for additional history.   toradol iv.  Trial of po fluids.   Recheck vitals.   Pt reports feeling much improved and ready for discharge.   Tolerating po well. No faintness or dizziness. No nv.     Final Clinical Impressions(s) / ED Diagnoses   Final diagnoses:  None    ED Discharge Orders    None       Lajean Saver, MD 12/03/18 0010

## 2018-12-02 NOTE — ED Triage Notes (Signed)
C/o abd pain x 3 days-+vaginal d/c and urinary freq-denies n/v/d-NAD-steady gait

## 2018-12-02 NOTE — ED Notes (Signed)
Pt resting on stretcher comfortably.

## 2018-12-03 MED ORDER — CEPHALEXIN 500 MG PO CAPS: 1000.0000 mg | ORAL_CAPSULE | Freq: Two times a day (BID) | ORAL | 0 refills | Status: DC

## 2018-12-03 MED ORDER — ONDANSETRON 8 MG PO TBDP: 8.0000 mg | ORAL_TABLET | Freq: Three times a day (TID) | ORAL | 0 refills | Status: DC | PRN

## 2018-12-03 MED ORDER — TRAMADOL HCL 50 MG PO TABS: 50.0000 mg | ORAL_TABLET | Freq: Four times a day (QID) | ORAL | 0 refills | Status: DC | PRN

## 2018-12-03 NOTE — Discharge Instructions (Addendum)
It was our pleasure to provide your ER care today - we hope that you feel better.  Take antibiotic (keflex) as prescribed.   You may take ultram as need for pain - no driving when taking.   Take zofran as need for nausea.  Follow up with primary care doctor in the coming week.  Return to ER right away if worse, new symptoms, worsening or intractable pain, persistent vomiting, weak/faint, other concern.

## 2018-12-05 LAB — URINE CULTURE: Culture: >=100000 — AB

## 2018-12-06 ENCOUNTER — Telehealth: Payer: Self-pay | Admitting: *Deleted

## 2018-12-06 NOTE — Telephone Encounter (Signed)
Post ED Visit - Positive Culture Follow-up  Culture report reviewed by antimicrobial stewardship pharmacist: [facility] Team []  Elenor Quinones, Pharm.D. []  Heide Guile, Pharm.D., BCPS AQ-ID []  Parks Neptune, Pharm.D., BCPS []  Alycia Rossetti, Pharm.D., BCPS []  [place], Pharm.D., BCPS, AAHIVP []  Legrand Como, Pharm.D., BCPS, AAHIVP [x]  Salome Arnt, PharmD, BCPS []  Johnnette Gourd, PharmD, BCPS []  Hughes Better, PharmD, BCPS []  Leeroy Cha, PharmD []  Laqueta Linden, PharmD, BCPS []  Albertina Parr, PharmD  [facility] Team []  Leodis Sias, PharmD []  Lindell Spar, PharmD []  Royetta Asal, PharmD []  Graylin Shiver, Rph []  Rema Fendt) Glennon Mac, PharmD []  Arlyn Dunning, PharmD []  Netta Cedars, PharmD []  Dia Sitter, PharmD []  Leone Haven, PharmD []  Gretta Arab, PharmD []  Theodis Shove, PharmD []  Peggyann Juba, PharmD []  Reuel Boom, PharmD   Positive urine culture Treated with Cephalexin, organism sensitive to the same and no further patient follow-up is required at this time.  Harlon Flor [facility] 12/06/2018, 9:29 AM

## 2019-02-06 ENCOUNTER — Ambulatory Visit (HOSPITAL_COMMUNITY)
Admission: EM | Admit: 2019-02-06 | Discharge: 2019-02-06 | Disposition: A | Discharge status: Home / Self Care | Payer: Self-pay | Attending: Family Medicine | Admitting: Family Medicine

## 2019-02-06 ENCOUNTER — Encounter (HOSPITAL_COMMUNITY): Payer: Self-pay | Admitting: Emergency Medicine

## 2019-02-06 ENCOUNTER — Other Ambulatory Visit: Payer: Self-pay

## 2019-02-06 DIAGNOSIS — Z3202 Encounter for pregnancy test, result negative: Secondary | ICD-10-CM

## 2019-02-06 DIAGNOSIS — R109 Unspecified abdominal pain: Secondary | ICD-10-CM | POA: Insufficient documentation

## 2019-02-06 DIAGNOSIS — R319 Hematuria, unspecified: Secondary | ICD-10-CM | POA: Insufficient documentation

## 2019-02-06 LAB — POCT PREGNANCY, URINE: Preg Test, Ur: NEGATIVE

## 2019-02-06 LAB — POCT URINALYSIS DIP (DEVICE)
Bilirubin Urine: NEGATIVE
Glucose, UA: NEGATIVE mg/dL
Ketones, ur: NEGATIVE mg/dL
Leukocytes,Ua: NEGATIVE
Nitrite: NEGATIVE
Protein, ur: NEGATIVE mg/dL
Specific Gravity, Urine: >=1.03 (ref 1.005–1.030)
Urobilinogen, UA: 0.2 mg/dL (ref 0.0–1.0)
pH: 7 (ref 5.0–8.0)

## 2019-02-06 MED ORDER — CEFUROXIME AXETIL 250 MG PO TABS: 250.0000 mg | ORAL_TABLET | Freq: Two times a day (BID) | ORAL | 0 refills | Status: DC

## 2019-02-06 MED ORDER — TRAMADOL HCL 50 MG PO TABS: 50.0000 mg | ORAL_TABLET | Freq: Four times a day (QID) | ORAL | 0 refills | Status: DC | PRN

## 2019-02-06 MED ORDER — CEFTRIAXONE SODIUM 1 G IJ SOLR
1.0000 g | Freq: Once | INTRAMUSCULAR | Status: AC
  Administered 2019-02-06: 1 g via INTRAMUSCULAR

## 2019-02-06 MED ORDER — CEFTRIAXONE SODIUM 1 G IJ SOLR
INTRAMUSCULAR | Status: AC
  Filled 2019-02-06: qty 10

## 2019-02-06 NOTE — ED Provider Notes (Signed)
[facility]    CSN: [id number] Arrival date & time: 02/06/19  [id number]      History   Chief Complaint Chief Complaint  Patient presents with  . Back Pain    HPI Laurie Bell is a 38 y.o. female.   HPI  Patient had a recent emergency room visit for flank pain and urinary symptoms.  She was found to have acute pyelonephritis.  Her culture grew E. coli.  She was treated with a shot of Rocephin and then Keflex.  She states she felt completely better.  She states she is bad about drinking of water.  She unfortunately is having similar symptoms.  Flank pain on both sides.  Nausea.  Some sweats and chills.  Burning when she urinates.  Has not noticed any hematuria.  Is never been diagnosed with kidney stones.  She is had her tubes tied and is certain that she is not pregnant.  Past Medical History:  Diagnosis Date  . Chlamydia contact, treated   . No pertinent past medical history     Patient Active Problem List   Diagnosis Date Noted  . DUB (dysfunctional uterine bleeding) 01/12/2017  . 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 ENDOMETRIAL ABLATION N/A 04/25/2017   Procedure: ENDOMETRIAL ABLATION With NOVASURE;  Surgeon: Woodroe Mode, MD;  Location: [facility];  Service: Gynecology;  Laterality: N/A;  . TUBAL LIGATION      OB History    Gravida  2   Para  2   Term  2   Preterm  0   AB  0   Living  2     SAB  0   TAB  0   Ectopic  0   Multiple  0   Live Births               Home Medications    Prior to Admission medications   Medication Sig Start Date End Date Taking? Authorizing Provider  acetaminophen (TYLENOL) 500 MG tablet Take 1,000 mg by mouth every 6 (six) hours as needed for moderate pain.    [provider]  cefUROXime (CEFTIN) 250 MG tablet Take 1 tablet (250 mg total) by mouth 2 (two) times daily with a meal. 02/06/19   Raylene Everts, MD    medroxyPROGESTERone (PROVERA) 10 MG tablet Take 2 tablets by mouth daily 02/06/18   Sloan Leiter, MD  ondansetron (ZOFRAN ODT) 8 MG disintegrating tablet Take 1 tablet (8 mg total) by mouth every 8 (eight) hours as needed for nausea or vomiting. 12/03/18   Lajean Saver, MD  traMADol (ULTRAM) 50 MG tablet Take 1 tablet (50 mg total) by mouth every 6 (six) hours as needed. 02/06/19   Raylene Everts, MD    Family History Family History  Problem Relation Age of Onset  . Hypertension Mother     Social History Social History   Tobacco Use  . Smoking status: Former Research scientist (life sciences)  . Smokeless tobacco: Never Used  Substance Use Topics  . Alcohol use: No  . Drug use: No     Allergies   Latex   Review of Systems Review of Systems  Constitutional: Negative for chills and fever.  HENT: Negative for ear pain and sore throat.   Eyes: Negative for pain and visual disturbance.  Respiratory: Negative for cough and shortness of breath.   Cardiovascular: Negative for chest pain and palpitations.  Gastrointestinal: Negative  for abdominal pain and vomiting.  Genitourinary: Positive for dysuria, flank pain and frequency. Negative for hematuria.  Musculoskeletal: Negative for arthralgias and back pain.  Skin: Negative for color change and rash.  Neurological: Negative for seizures and syncope.  All other systems reviewed and are negative.    Physical Exam Triage Vital Signs ED Triage Vitals [02/06/19 1917]  Enc Vitals Group     BP 138/85     Pulse Rate 94     Resp 18     Temp 99 F (37.2 C)     Temp Source Oral     SpO2 97 %     Weight      Height      Head Circumference      Peak Flow      Pain Score 8     Pain Loc      Pain Edu?      Excl. in [place]?    No data found.  Updated Vital Signs BP 138/85 (BP Location: Right Arm)   Pulse 94   Temp 99 F (37.2 C) (Oral)   Resp 18   SpO2 97%      Physical Exam Constitutional:      General: She is not in acute distress.     Appearance: She is well-developed and normal weight.     Comments: Appears uncomfortable.  Moves slowly  HENT:     Head: Normocephalic and atraumatic.  Eyes:     Conjunctiva/sclera: Conjunctivae normal.     Pupils: Pupils are equal, round, and reactive to light.  Neck:     Musculoskeletal: Normal range of motion and neck supple.  Cardiovascular:     Rate and Rhythm: Normal rate and regular rhythm.     Heart sounds: Normal heart sounds.  Pulmonary:     Effort: Pulmonary effort is normal. No respiratory distress.     Breath sounds: Normal breath sounds.  Abdominal:     General: There is no distension.     Palpations: Abdomen is soft.     Tenderness: There is abdominal tenderness. There is right CVA tenderness and left CVA tenderness.     Comments: Tender in both CVA areas.  Tenderness to deep palpation in both mid abdominal regions over the kidneys.  No guarding or rebound.  Musculoskeletal: Normal range of motion.  Skin:    General: Skin is warm and dry.  Neurological:     Mental Status: She is alert.      UC Treatments / Results  Labs (all labs ordered are listed, but only abnormal results are displayed) Labs Reviewed  POCT URINALYSIS DIP (DEVICE) - Abnormal; Notable for the following components:      Result Value   Hgb urine dipstick MODERATE (*)    All other components within normal limits  URINE CULTURE  POCT PREGNANCY, URINE   Urine has hematuria but no leukocytes or nitrates. EKG   Radiology No results found.  Procedures Procedures (including critical care time)  Medications Ordered in UC Medications  cefTRIAXone (ROCEPHIN) injection 1 g (has no administration in time range)    Initial Impression / Assessment and Plan / UC Course  I have reviewed the triage vital signs and the nursing notes.  Pertinent labs & imaging results that were available during my care of the patient were reviewed by me and considered in my medical decision making (see chart for  details).     I told the patient she could either be having a  kidney stone or another kidney infection.  Difficult to tell by her physical examination, and urinary findings.  I am going to cover her with antibiotics.  Medicine for pain.  She is instructed to go directly to the emergency room if worse instead of better Final Clinical Impressions(s) / UC Diagnoses   Final diagnoses:  Flank pain  Hematuria, unspecified type     Discharge Instructions     You have pain in the area of your kidneys.  You have blood in your urine.  This means you either have a kidney infection or kidney stones.  I am going to treat you for kidney infection with antibiotics and pain medication.  You must increase the water that you are drinking.  Get plenty of rest.  If you get worse instead of better at any time go to the emergency room.   ED Prescriptions    Medication Sig Dispense Auth. Provider   traMADol (ULTRAM) 50 MG tablet Take 1 tablet (50 mg total) by mouth every 6 (six) hours as needed. 20 tablet Raylene Everts, MD   cefUROXime (CEFTIN) 250 MG tablet Take 1 tablet (250 mg total) by mouth 2 (two) times daily with a meal. 20 tablet Raylene Everts, MD     Controlled Substance Prescriptions [place] Controlled Substance Registry consulted? Yes, I have consulted the [place] Controlled Substances Registry for this patient, and feel the risk/benefit ratio today is favorable for proceeding with this prescription for a controlled substance.   Raylene Everts, MD 02/06/19 [phone]

## 2019-02-06 NOTE — ED Triage Notes (Signed)
Pt sts back and flank pain with hx of kidney infection

## 2019-02-06 NOTE — Discharge Instructions (Signed)
You have pain in the area of your kidneys.  You have blood in your urine.  This means you either have a kidney infection or kidney stones.  I am going to treat you for kidney infection with antibiotics and pain medication.  You must increase the water that you are drinking.  Get plenty of rest.  If you get worse instead of better at any time go to the emergency room.

## 2019-02-07 LAB — URINE CULTURE: Culture: <10000 — AB

## 2019-02-18 ENCOUNTER — Other Ambulatory Visit: Payer: Self-pay

## 2019-02-18 DIAGNOSIS — Z20822 Contact with and (suspected) exposure to covid-19: Secondary | ICD-10-CM

## 2019-02-20 LAB — NOVEL CORONAVIRUS, NAA: SARS-CoV-2, NAA: DETECTED — AB

## 2019-04-21 ENCOUNTER — Other Ambulatory Visit: Payer: Self-pay

## 2019-04-21 DIAGNOSIS — Z20822 Contact with and (suspected) exposure to covid-19: Secondary | ICD-10-CM

## 2019-04-22 LAB — NOVEL CORONAVIRUS, NAA: SARS-CoV-2, NAA: NOT DETECTED

## 2019-06-05 ENCOUNTER — Other Ambulatory Visit: Payer: Self-pay

## 2019-06-05 ENCOUNTER — Encounter (HOSPITAL_COMMUNITY): Payer: Self-pay

## 2019-06-05 ENCOUNTER — Emergency Department (HOSPITAL_COMMUNITY)
Admission: EM | Admit: 2019-06-05 | Discharge: 2019-06-05 | Disposition: A | Discharge status: Home / Self Care | Payer: Self-pay | Attending: Emergency Medicine | Admitting: Emergency Medicine

## 2019-06-05 ENCOUNTER — Emergency Department (HOSPITAL_COMMUNITY): Payer: Self-pay

## 2019-06-05 DIAGNOSIS — Z91040 Latex allergy status: Secondary | ICD-10-CM | POA: Insufficient documentation

## 2019-06-05 DIAGNOSIS — W109XXA Fall (on) (from) unspecified stairs and steps, initial encounter: Secondary | ICD-10-CM | POA: Insufficient documentation

## 2019-06-05 DIAGNOSIS — Z87891 Personal history of nicotine dependence: Secondary | ICD-10-CM | POA: Insufficient documentation

## 2019-06-05 DIAGNOSIS — S7002XA Contusion of left hip, initial encounter: Secondary | ICD-10-CM | POA: Insufficient documentation

## 2019-06-05 DIAGNOSIS — Y999 Unspecified external cause status: Secondary | ICD-10-CM | POA: Insufficient documentation

## 2019-06-05 DIAGNOSIS — Y939 Activity, unspecified: Secondary | ICD-10-CM | POA: Insufficient documentation

## 2019-06-05 DIAGNOSIS — Y929 Unspecified place or not applicable: Secondary | ICD-10-CM | POA: Insufficient documentation

## 2019-06-05 MED ORDER — IBUPROFEN 200 MG PO TABS
600.0000 mg | ORAL_TABLET | Freq: Once | ORAL | Status: AC
  Administered 2019-06-05: 600 mg via ORAL
  Filled 2019-06-05: qty 3

## 2019-06-05 MED ORDER — TRAMADOL HCL 50 MG PO TABS: 50.0000 mg | ORAL_TABLET | Freq: Four times a day (QID) | ORAL | 0 refills | Status: DC | PRN

## 2019-06-05 MED ORDER — IBUPROFEN 600 MG PO TABS: 600.0000 mg | ORAL_TABLET | Freq: Four times a day (QID) | ORAL | 0 refills | Status: DC | PRN

## 2019-06-05 MED ORDER — ACETAMINOPHEN 500 MG PO TABS
1000.0000 mg | ORAL_TABLET | Freq: Once | ORAL | Status: AC
  Administered 2019-06-05: 1000 mg via ORAL
  Filled 2019-06-05: qty 2

## 2019-06-05 NOTE — ED Provider Notes (Signed)
[facility] DEPT Provider Note   CSN: [id number] Arrival date & time: 06/05/19  1022     History Chief Complaint  Patient presents with  . Fall    Patricka N Howcroft is a 38 y.o. female.  HPI    Patient states she slipped down several stairs landing on her left buttocks.  She complains of low back pain and left buttock pain.  No head injury.  No loss of consciousness.  No focal weakness or numbness. Past Medical History:  Diagnosis Date  . Chlamydia contact, treated   . No pertinent past medical history     Patient Active Problem List   Diagnosis Date Noted  . DUB (dysfunctional uterine bleeding) 01/12/2017  . 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 ENDOMETRIAL ABLATION N/A 04/25/2017   Procedure: ENDOMETRIAL ABLATION With NOVASURE;  Surgeon: Woodroe Mode, MD;  Location: [facility];  Service: Gynecology;  Laterality: N/A;  . TUBAL LIGATION       OB History    Gravida  2   Para  2   Term  2   Preterm  0   AB  0   Living  2     SAB  0   TAB  0   Ectopic  0   Multiple  0   Live Births              Family History  Problem Relation Age of Onset  . Hypertension Mother     Social History   Tobacco Use  . Smoking status: Former Research scientist (life sciences)  . Smokeless tobacco: Never Used  Substance Use Topics  . Alcohol use: No  . Drug use: No    Home Medications Prior to Admission medications   Medication Sig Start Date End Date Taking? Authorizing Provider  pseudoephedrine-acetaminophen (TYLENOL SINUS) 30-500 MG TABS tablet Take 2 tablets by mouth every 4 (four) hours as needed (sinus pain).   Yes [provider]  cefUROXime (CEFTIN) 250 MG tablet Take 1 tablet (250 mg total) by mouth 2 (two) times daily with a meal. Patient not taking: Reported on 06/05/2019 02/06/19   Raylene Everts, MD  ibuprofen (ADVIL) 600 MG tablet Take 1 tablet (600 mg total) by  mouth every 6 (six) hours as needed. 06/05/19   Julianne Rice, MD  medroxyPROGESTERone (PROVERA) 10 MG tablet Take 2 tablets by mouth daily Patient not taking: Reported on 06/05/2019 02/06/18   Sloan Leiter, MD  ondansetron (ZOFRAN ODT) 8 MG disintegrating tablet Take 1 tablet (8 mg total) by mouth every 8 (eight) hours as needed for nausea or vomiting. Patient not taking: Reported on 06/05/2019 12/03/18   Lajean Saver, MD  traMADol (ULTRAM) 50 MG tablet Take 1 tablet (50 mg total) by mouth every 6 (six) hours as needed. 06/05/19   Julianne Rice, MD    Allergies    Latex  Review of Systems   Review of Systems  Gastrointestinal: Negative for abdominal pain.  Musculoskeletal: Positive for arthralgias, back pain and myalgias. Negative for neck pain.  Skin: Negative for rash and wound.  Neurological: Negative for dizziness, weakness, light-headedness, numbness and headaches.  All other systems reviewed and are negative.   Physical Exam Updated Vital Signs BP 123/81 (BP Location: Left Arm)   Pulse 79   Temp 98.8 F (37.1 C) (Oral)   Resp 18   SpO2 99%   Physical Exam Vitals and nursing note  reviewed.  Constitutional:      Appearance: Normal appearance. She is well-developed. She is not ill-appearing.  HENT:     Head: Normocephalic and atraumatic.  Eyes:     Pupils: Pupils are equal, round, and reactive to light.  Cardiovascular:     Rate and Rhythm: Normal rate and regular rhythm.  Pulmonary:     Effort: Pulmonary effort is normal.     Breath sounds: Normal breath sounds.  Abdominal:     General: Bowel sounds are normal.     Palpations: Abdomen is soft.     Tenderness: There is no abdominal tenderness. There is no guarding or rebound.  Musculoskeletal:        General: Tenderness present. No swelling, deformity or signs of injury. Normal range of motion.     Cervical back: Normal range of motion and neck supple. No rigidity or tenderness.     Right lower leg: No  edema.     Left lower leg: No edema.     Comments: Patient with midline lumbar tenderness diffusely as well as left paralumbar muscular tenderness and tenderness over the left buttocks.  No obvious signs of deformity, swelling or injury.  Patient with full range of motion of the left knee and hip.  Distal pulses intact.  Lymphadenopathy:     Cervical: No cervical adenopathy.  Skin:    General: Skin is warm and dry.     Findings: No erythema or rash.  Neurological:     General: No focal deficit present.     Mental Status: She is alert and oriented to person, place, and time.     Comments: 5/5 motor in all extremities.  Sensation intact.  Psychiatric:        Behavior: Behavior normal.     ED Results / Procedures / Treatments   Labs (all labs ordered are listed, but only abnormal results are displayed) Labs Reviewed - No data to display  EKG None  Radiology DG Lumbar Spine 2-3 Views  Result Date: 06/05/2019 CLINICAL DATA:  Severe low back and left hip pain following a fall today. EXAM: LUMBAR SPINE - 2-3 VIEW COMPARISON:  07/17/2008. FINDINGS: Five non-rib-bearing lumbar vertebrae. These have normal appearances with no fracture or subluxation. Sclerosis at both sacroiliac joints without significant change. There is also an oval area of sclerosis within or overlying the right sacral ala, not previously seen. IMPRESSION: 1. No fracture or subluxation. 2. Stable bilateral sacroiliac joint degenerative changes. 3. New oval area of sclerosis within or overlying the right sacral ala. This may represent a developing bone island. A sclerotic metastasis is unlikely in the absence of known malignancy and no other visible sclerotic lesions. Electronically Signed   By: Claudie Revering M.D.   On: 06/05/2019 11:48   DG Hip Unilat W or Wo Pelvis 2-3 Views Left  Result Date: 06/05/2019 CLINICAL DATA:  Severe low back and left hip pain following a fall this morning. EXAM: DG HIP (WITH OR WITHOUT PELVIS)  2-3V LEFT COMPARISON:  Lumbar spine radiographs obtained today and on 07/18/2008. Abdomen pelvis CT dated 09/30/2007. FINDINGS: Normal appearing left hip without fracture or dislocation. Previously described sclerosis in the region of both sacroiliac joints and new oval area of sclerosis within or overlying the right sacral ala. IMPRESSION: 1. Normal appearing left hip. 2. New oval area of sclerosis within or overlying the right sacral ala. This may represent a developing bone island or other nonaggressive lesion. A sclerotic metastasis is unlikely in the  absence of known malignancy and no other visible sclerotic lesions. Electronically Signed   By: Claudie Revering M.D.   On: 06/05/2019 11:50    Procedures Procedures (including critical care time)  Medications Ordered in ED Medications  ibuprofen (ADVIL) tablet 600 mg (600 mg Oral Given 06/05/19 1216)  acetaminophen (TYLENOL) tablet 1,000 mg (1,000 mg Oral Given 06/05/19 1217)    ED Course  I have reviewed the triage vital signs and the nursing notes.  Pertinent labs & imaging results that were available during my care of the patient were reviewed by me and considered in my medical decision making (see chart for details).    MDM Rules/Calculators/A&P                      X-rays without acute findings.  Oval area of sclerosis on the right sacral alla.  Patient is having no symptoms that sized.  Low suspicion for metastatic process.  Patient is ambulating.  Have advised ice and medication.  Return precautions given. Final Clinical Impression(s) / ED Diagnoses Final diagnoses:  Contusion of left hip, initial encounter    Rx / DC Orders ED Discharge Orders         Ordered    ibuprofen (ADVIL) 600 MG tablet  Every 6 hours PRN     06/05/19 1315    traMADol (ULTRAM) 50 MG tablet  Every 6 hours PRN     06/05/19 1315           Julianne Rice, MD 06/05/19 1318

## 2019-06-05 NOTE — ED Triage Notes (Addendum)
Pt BIB EMS from home. Pt reports walked down wooden steps and slipped and fell down 4-5 stairs. Pt reports left buttock pain. No obvious deformity. No LOC.  118/76 HR 70 RR 18 99% RA

## 2020-02-01 ENCOUNTER — Emergency Department (HOSPITAL_COMMUNITY): Payer: Self-pay

## 2020-02-01 ENCOUNTER — Emergency Department (HOSPITAL_COMMUNITY)
Admission: EM | Admit: 2020-02-01 | Discharge: 2020-02-02 | Disposition: A | Discharge status: Home / Self Care | Payer: Self-pay | Attending: Emergency Medicine | Admitting: Emergency Medicine

## 2020-02-01 ENCOUNTER — Encounter (HOSPITAL_COMMUNITY): Payer: Self-pay | Admitting: Emergency Medicine

## 2020-02-01 ENCOUNTER — Other Ambulatory Visit: Payer: Self-pay

## 2020-02-01 DIAGNOSIS — R0602 Shortness of breath: Secondary | ICD-10-CM | POA: Insufficient documentation

## 2020-02-01 DIAGNOSIS — Z87891 Personal history of nicotine dependence: Secondary | ICD-10-CM | POA: Insufficient documentation

## 2020-02-01 DIAGNOSIS — J3489 Other specified disorders of nose and nasal sinuses: Secondary | ICD-10-CM | POA: Insufficient documentation

## 2020-02-01 DIAGNOSIS — Z91040 Latex allergy status: Secondary | ICD-10-CM | POA: Insufficient documentation

## 2020-02-01 DIAGNOSIS — R0789 Other chest pain: Secondary | ICD-10-CM | POA: Insufficient documentation

## 2020-02-01 DIAGNOSIS — Z79899 Other long term (current) drug therapy: Secondary | ICD-10-CM | POA: Insufficient documentation

## 2020-02-01 DIAGNOSIS — R079 Chest pain, unspecified: Secondary | ICD-10-CM

## 2020-02-01 DIAGNOSIS — R0981 Nasal congestion: Secondary | ICD-10-CM | POA: Insufficient documentation

## 2020-02-01 NOTE — ED Triage Notes (Signed)
Patient arrived with EMS from home reports central chest pain radiating to left jaw and left arm onset yesterday , she received ASA 324 mg and 1 NTG sl prior to arrival , respirations unlabored , no emesis or diaphoresis .

## 2020-02-02 LAB — CBC
HCT: 40.4 % (ref 36.0–46.0)
Hemoglobin: 13.3 g/dL (ref 12.0–15.0)
MCH: 28.5 pg (ref 26.0–34.0)
MCHC: 32.9 g/dL (ref 30.0–36.0)
MCV: 86.5 fL (ref 80.0–100.0)
Platelets: 226 10*3/uL (ref 150–400)
RBC: 4.67 MIL/uL (ref 3.87–5.11)
RDW: 12.7 % (ref 11.5–15.5)
WBC: 10.4 10*3/uL (ref 4.0–10.5)
nRBC: 0 % (ref 0.0–0.2)

## 2020-02-02 LAB — BASIC METABOLIC PANEL
Anion gap: 9 (ref 5–15)
BUN: 8 mg/dL (ref 6–20)
CO2: 22 mmol/L (ref 22–32)
Calcium: 8.9 mg/dL (ref 8.9–10.3)
Chloride: 106 mmol/L (ref 98–111)
Creatinine, Ser: 0.64 mg/dL (ref 0.44–1.00)
GFR calc Af Amer: >60 mL/min (ref >60)
GFR calc non Af Amer: >60 mL/min (ref >60)
Glucose, Bld: 111 mg/dL — ABNORMAL HIGH (ref 70–99)
Potassium: 3.2 mmol/L — ABNORMAL LOW (ref 3.5–5.1)
Sodium: 137 mmol/L (ref 135–145)

## 2020-02-02 LAB — I-STAT BETA HCG BLOOD, ED (MC, WL, AP ONLY): I-stat hCG, quantitative: <5 m[IU]/mL (ref <5)

## 2020-02-02 LAB — TROPONIN I (HIGH SENSITIVITY)
Troponin I (High Sensitivity): <2 ng/L (ref <18)
Troponin I (High Sensitivity): 2 ng/L (ref <18)

## 2020-02-02 MED ORDER — POTASSIUM CHLORIDE CRYS ER 20 MEQ PO TBCR: 20.0000 meq | EXTENDED_RELEASE_TABLET | Freq: Every day | ORAL | 0 refills | Status: DC

## 2020-02-02 MED ORDER — NAPROXEN 500 MG PO TABS: 500.0000 mg | ORAL_TABLET | Freq: Two times a day (BID) | ORAL | 0 refills | Status: DC | PRN

## 2020-02-02 MED ORDER — POTASSIUM CHLORIDE CRYS ER 20 MEQ PO TBCR
40.0000 meq | EXTENDED_RELEASE_TABLET | Freq: Once | ORAL | Status: AC
  Administered 2020-02-02: 40 meq via ORAL
  Filled 2020-02-02: qty 2

## 2020-02-02 MED ORDER — HYDROXYZINE HCL 10 MG PO TABS: 10.0000 mg | ORAL_TABLET | Freq: Three times a day (TID) | ORAL | 0 refills | Status: DC | PRN

## 2020-02-02 MED ORDER — HYDROXYZINE HCL 10 MG PO TABS
10.0000 mg | ORAL_TABLET | Freq: Once | ORAL | Status: AC
  Administered 2020-02-02: 10 mg via ORAL
  Filled 2020-02-02: qty 1

## 2020-02-02 MED ORDER — NAPROXEN 250 MG PO TABS
500.0000 mg | ORAL_TABLET | Freq: Once | ORAL | Status: AC
  Administered 2020-02-02: 500 mg via ORAL
  Filled 2020-02-02: qty 2

## 2020-02-02 NOTE — ED Notes (Signed)
Iv removed from [facility]

## 2020-02-02 NOTE — ED Provider Notes (Signed)
[facility] EMERGENCY DEPARTMENT Provider Note   CSN: [id number] Arrival date & time: 02/01/20  2151     History Chief Complaint  Patient presents with  . Chest Pain    Laurie Bell is a 39 y.o. female with a history of fibroids & prior tubal ligation who presents to the ED with complaints of chest pain that started yesterday. Patient states she woke from sleep yesterday with pain to the L chest, L shoulder, & L side of her jaw/face, this lasted about 45 minutes and she became very upset/stressed regarding it prompting ED visit. Pain to jaw/face/LUE has resolved but continues to occur intermittently in the L chest, feels like an ache, worse with stress (such as hearing individuals talk about covid in the lobby or thinking about work) as well as with LUE movements or palpation. No specific alleviating factors. She has been having intermittent panic attacks and pain with dyspnea, states she has had problems with panic attacks since having COVID last year. She has had increased stress at work. She also mentions sinus pain/pressure & congestion x 2 weeks similar to prior sinus infections which have required abx. She denies leg pain/swelling, hemoptysis, recent surgery/trauma, recent long travel, hormone use, personal hx of cancer, or hx of DVT/PE. Denies tobacco or drug use. Denies family history of early CAD.   HPI     Past Medical History:  Diagnosis Date  . Chlamydia contact, treated   . No pertinent past medical history     Patient Active Problem List   Diagnosis Date Noted  . DUB (dysfunctional uterine bleeding) 01/12/2017  . 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 ENDOMETRIAL ABLATION N/A 04/25/2017   Procedure: ENDOMETRIAL ABLATION With NOVASURE;  Surgeon: Woodroe Mode, MD;  Location: [place];  Service: Gynecology;  Laterality: N/A;  . TUBAL LIGATION       OB History    Gravida  2    Para  2   Term  2   Preterm  0   AB  0   Living  2     SAB  0   TAB  0   Ectopic  0   Multiple  0   Live Births              Family History  Problem Relation Age of Onset  . Hypertension Mother     Social History   Tobacco Use  . Smoking status: Former Research scientist (life sciences)  . Smokeless tobacco: Never Used  Vaping Use  . Vaping Use: Never used  Substance Use Topics  . Alcohol use: No  . Drug use: No    Home Medications Prior to Admission medications   Medication Sig Start Date End Date Taking? Authorizing Provider  cefUROXime (CEFTIN) 250 MG tablet Take 1 tablet (250 mg total) by mouth 2 (two) times daily with a meal. Patient not taking: Reported on 06/05/2019 02/06/19   Raylene Everts, MD  ibuprofen (ADVIL) 600 MG tablet Take 1 tablet (600 mg total) by mouth every 6 (six) hours as needed. 06/05/19   Julianne Rice, MD  medroxyPROGESTERone (PROVERA) 10 MG tablet Take 2 tablets by mouth daily Patient not taking: Reported on 06/05/2019 02/06/18   Sloan Leiter, MD  ondansetron (ZOFRAN ODT) 8 MG disintegrating tablet Take 1 tablet (8 mg total) by mouth every 8 (eight) hours as needed for nausea or vomiting. Patient not taking: Reported on 06/05/2019  12/03/18   Lajean Saver, MD  pseudoephedrine-acetaminophen (TYLENOL SINUS) 30-500 MG TABS tablet Take 2 tablets by mouth every 4 (four) hours as needed (sinus pain).    [provider]  traMADol (ULTRAM) 50 MG tablet Take 1 tablet (50 mg total) by mouth every 6 (six) hours as needed. 06/05/19   Julianne Rice, MD    Allergies    Latex  Review of Systems   Review of Systems  Constitutional: Negative for chills, diaphoresis and fever.  HENT: Positive for congestion, sinus pressure and sinus pain.   Respiratory: Positive for shortness of breath. Negative for cough.   Cardiovascular: Positive for chest pain. Negative for leg swelling.  Gastrointestinal: Negative for abdominal pain and nausea.  Genitourinary:  Negative for dysuria.  Neurological: Negative for dizziness, syncope and facial asymmetry.  Psychiatric/Behavioral: Negative for suicidal ideas. The patient is nervous/anxious.   All other systems reviewed and are negative.   Physical Exam Updated Vital Signs BP 118/78 (BP Location: Left Arm)   Pulse 71   Temp 98.3 F (36.8 C)   Resp 14   Ht 5\' 6"  (1.676 m)   Wt 85 kg   SpO2 100%   BMI 30.25 kg/m   Physical Exam Vitals and nursing note reviewed.  Constitutional:      General: She is not in acute distress.    Appearance: She is well-developed.  HENT:     Head: Normocephalic and atraumatic.     Right Ear: Ear canal normal. Tympanic membrane is not perforated, erythematous, retracted or bulging.     Left Ear: Ear canal normal. Tympanic membrane is not perforated, erythematous, retracted or bulging.     Ears:     Comments: No mastoid erythema/swelling/tenderness.     Nose: Congestion present.     Right Sinus: Maxillary sinus tenderness present.     Left Sinus: Maxillary sinus tenderness present. No frontal sinus tenderness.     Mouth/Throat:     Pharynx: Uvula midline. No oropharyngeal exudate or posterior oropharyngeal erythema.     Comments: Posterior oropharynx is symmetric appearing. Patient tolerating own secretions without difficulty. No trismus. No drooling. No hot potato voice. No swelling beneath the tongue, submandibular compartment is soft.  Eyes:     General:        Right eye: No discharge.        Left eye: No discharge.     Conjunctiva/sclera: Conjunctivae normal.     Pupils: Pupils are equal, round, and reactive to light.  Cardiovascular:     Rate and Rhythm: Normal rate and regular rhythm.     Pulses:          Radial pulses are 2+ on the right side and 2+ on the left side.       Dorsalis pedis pulses are 2+ on the right side and 2+ on the left side.     Heart sounds: No murmur heard.   Pulmonary:     Effort: Pulmonary effort is normal. No respiratory  distress.     Breath sounds: Normal breath sounds. No wheezing, rhonchi or rales.  Chest:     Chest wall: Tenderness (left lower chest wall which reproduces patients pain, also reproduces with LUE shoulder flexion, no overlying skin changes) present.  Abdominal:     General: There is no distension.     Palpations: Abdomen is soft.     Tenderness: There is no abdominal tenderness.  Musculoskeletal:     Cervical back: Normal range of motion and  neck supple. No edema or rigidity.     Right lower leg: No tenderness. No edema.     Left lower leg: No tenderness. No edema.  Lymphadenopathy:     Cervical: No cervical adenopathy.  Skin:    General: Skin is warm and dry.     Findings: No rash.  Neurological:     Mental Status: She is alert.  Psychiatric:        Behavior: Behavior normal.     ED Results / Procedures / Treatments   Labs (all labs ordered are listed, but only abnormal results are displayed) Labs Reviewed  BASIC METABOLIC PANEL - Abnormal; Notable for the following components:      Result Value   Potassium 3.2 (*)    Glucose, Bld 111 (*)    All other components within normal limits  CBC  I-STAT BETA HCG BLOOD, ED ([facility], [facility], [facility] ONLY)  TROPONIN I (HIGH SENSITIVITY)  TROPONIN I (HIGH SENSITIVITY)    EKG EKG Interpretation  Date/Time:  Sunday February 01 2020 22:07:10 EDT Ventricular Rate:  90 PR Interval:  144 QRS Duration: 72 QT Interval:  376 QTC Calculation: 459 R Axis:   48 Text Interpretation: sinus rhythm with artifact Low voltage QRS Septal infarct , age undetermined Abnormal ECG nonspecific T wave changes Reconfirmed by Sherwood Gambler [phone]) on 02/02/2020 12:06:28 PM   Radiology DG Chest 2 View  Result Date: 02/01/2020 CLINICAL DATA:  Chest pain EXAM: CHEST - 2 VIEW COMPARISON:  01/09/2018 FINDINGS: The heart size and mediastinal contours are within normal limits. Both lungs are clear. The visualized skeletal structures are unremarkable. IMPRESSION: No  active cardiopulmonary disease. Electronically Signed   By: Ulyses Jarred M.D.   On: 02/01/2020 22:59    Procedures Procedures (including critical care time)  Medications Ordered in ED Medications - No data to display  ED Course  I have reviewed the triage vital signs and the nursing notes.  Pertinent labs & imaging results that were available during my care of the patient were reviewed by me and considered in my medical decision making (see chart for details).    MDM Rules/Calculators/A&P                           Patient presents to the ED with complaints of chest pain.  Patient is nontoxic, resting comfortably, vitals without significant abnormality. On exam her chest pain is reproducible with chest wall palpation and movement of the LUE.  She has had some nasal congestion and sinus pain/pressure for the past 2 weeks and does have nasal congestion and sinus tenderness on exam.  Otherwise benign physical exam.   DDX: ACS, pulmonary embolism, dissection, pneumothorax, pneumonia, asthma, musculoskeletal pain, anxiety.  Also considering a bacterial sinusitis given her symptoms have been ongoing for greater than 10 days she does have sinus tenderness to palpation.  Additional history obtained:  Additional history obtained from chart and nursing note reviewed..  EKG: No STEMI Lab Tests:  I reviewed and interpreted labs, which included:  CBC: No anemia BMP: Mild hypokalemia, no other electrolyte derangement. Troponin: Flat, no significant elevation Pregnancy test: Negative  Imaging Studies ordered:  Chest x-ray was ordered per triage, I independently visualized and interpreted imaging which showed no acute process.  Patient has a low risk heart pathway score with 2 - troponins throughout her wait in the emergency department, EKG without STEMI, low suspicion for ACS.  She is low risk Wells, Elliott  negative, doubt pulmonary embolism.  No widened mediastinum on chest x-ray, symmetric  pulses, doubt dissection.  Her chest x-ray is also without pneumonia, fluid overload, or pneumothorax.  She has no significant laboratory abnormalities, oral potassium has been ordered for her mild hypokalemia.  She was given naproxen as well as Atarax in the emergency department with improvement in her symptoms. Unclear definitive etiology to her chest discomfort, however with some reproducibility with chest wall palpation and that she woke from sleep with this this may be a musculoskeletal issue, I suspect there is also a degree of anxiety/stress contributing.  We will discharge her home with naproxen and Atarax with PCP as well as cardiology follow-up and psychiatric resources.  Will provide a note for work for a few days at this is been significantly contributing to her stress. I discussed results, treatment plan, need for follow-up, and return precautions with the patient. Provided opportunity for questions, patient confirmed understanding and is in agreement with plan.   Portions of this note were generated with [employer]. Dictation errors may occur despite best attempts at proofreading.  Final Clinical Impression(s) / ED Diagnoses Final diagnoses:  Chest pain, unspecified type    Rx / DC Orders ED Discharge Orders         Ordered    naproxen (NAPROSYN) 500 MG tablet  2 times daily PRN        02/02/20 1428    hydrOXYzine (ATARAX/VISTARIL) 10 MG tablet  3 times daily PRN        02/02/20 1428    potassium chloride SA (KLOR-CON) 20 MEQ tablet  Daily        02/02/20 [address] [place], PA-C 02/02/20 1436    Sherwood Gambler, MD 02/02/20 1519

## 2020-02-02 NOTE — Discharge Instructions (Signed)
You were seen in the emergency department today for chest pain. Your work-up in the emergency department has been overall reassuring. Your labs have been fairly normal and or similar to previous blood work you have had done-your potassium was a bit low, we are sending you home with a supplement as well as some diet recommendations. Your enzymes we use to check your heart did not show an acute heart attack at this time. Your chest x-ray was normal.   Sending home with naproxen and Atarax to help with your symptoms. - Naproxen is a nonsteroidal anti-inflammatory medication that will help with pain and swelling. Be sure to take this medication as prescribed with food, 1 pill every 12 hours,  It should be taken with food, as it can cause stomach upset, and more seriously, stomach bleeding. Do not take other nonsteroidal anti-inflammatory medications with this such as Advil, Motrin, Aleve, Mobic, Goodie Powder, or Motrin.    -Atarax is a medication to help with anxiety/stress, please take every 8 hours as needed for this. You make take Tylenol per over the counter dosing with these medications.   We have prescribed you new medication(s) today. Discuss the medications prescribed today with your pharmacist as they can have adverse effects and interactions with your other medicines including over the counter and prescribed medications. Seek medical evaluation if you start to experience new or abnormal symptoms after taking one of these medicines, seek care immediately if you start to experience difficulty breathing, feeling of your throat closing, facial swelling, or rash as these could be indications of a more serious allergic reaction   We would like you to follow up closely with your primary care provider and/or the cardiologist provided in your discharge instructions within 1-3 days.  We have also provided some resources in terms of behavioral health follow-up to help with stress/anxiety.  Return to the ER  immediately should you experience any new or worsening symptoms including but not limited to return of pain, worsened pain, vomiting, shortness of breath, dizziness, lightheadedness, passing out, or any other concerns that you may have.

## 2020-02-02 NOTE — ED Notes (Signed)
Patient verbalizes understanding of discharge instructions. Opportunity for questioning and answers were provided. Pt discharged from ED. 

## 2020-08-24 ENCOUNTER — Inpatient Hospital Stay (HOSPITAL_COMMUNITY)
Admission: EM | Admit: 2020-08-24 | Discharge: 2020-08-27 | DRG: 641 | Disposition: A | Discharge status: Home / Self Care | Payer: Self-pay | Attending: Family Medicine | Admitting: Family Medicine

## 2020-08-24 ENCOUNTER — Encounter (HOSPITAL_COMMUNITY): Payer: Self-pay | Admitting: Emergency Medicine

## 2020-08-24 ENCOUNTER — Other Ambulatory Visit: Payer: Self-pay

## 2020-08-24 ENCOUNTER — Emergency Department (HOSPITAL_COMMUNITY): Payer: Self-pay

## 2020-08-24 DIAGNOSIS — Z8616 Personal history of COVID-19: Secondary | ICD-10-CM

## 2020-08-24 DIAGNOSIS — R079 Chest pain, unspecified: Secondary | ICD-10-CM

## 2020-08-24 DIAGNOSIS — R112 Nausea with vomiting, unspecified: Secondary | ICD-10-CM

## 2020-08-24 DIAGNOSIS — Z79899 Other long term (current) drug therapy: Secondary | ICD-10-CM

## 2020-08-24 DIAGNOSIS — Z8249 Family history of ischemic heart disease and other diseases of the circulatory system: Secondary | ICD-10-CM

## 2020-08-24 DIAGNOSIS — K219 Gastro-esophageal reflux disease without esophagitis: Secondary | ICD-10-CM

## 2020-08-24 DIAGNOSIS — M940 Chondrocostal junction syndrome [Tietze]: Secondary | ICD-10-CM | POA: Diagnosis present

## 2020-08-24 DIAGNOSIS — R1110 Vomiting, unspecified: Secondary | ICD-10-CM

## 2020-08-24 DIAGNOSIS — E876 Hypokalemia: Principal | ICD-10-CM | POA: Diagnosis present

## 2020-08-24 DIAGNOSIS — R1013 Epigastric pain: Secondary | ICD-10-CM

## 2020-08-24 DIAGNOSIS — I10 Essential (primary) hypertension: Secondary | ICD-10-CM | POA: Diagnosis present

## 2020-08-24 DIAGNOSIS — Z87891 Personal history of nicotine dependence: Secondary | ICD-10-CM

## 2020-08-24 DIAGNOSIS — R0789 Other chest pain: Secondary | ICD-10-CM

## 2020-08-24 DIAGNOSIS — K449 Diaphragmatic hernia without obstruction or gangrene: Secondary | ICD-10-CM | POA: Diagnosis present

## 2020-08-24 DIAGNOSIS — Z20822 Contact with and (suspected) exposure to covid-19: Secondary | ICD-10-CM | POA: Diagnosis present

## 2020-08-24 DIAGNOSIS — Z91040 Latex allergy status: Secondary | ICD-10-CM

## 2020-08-24 LAB — CBC
HCT: 41.1 % (ref 36.0–46.0)
Hemoglobin: 13.9 g/dL (ref 12.0–15.0)
MCH: 29 pg (ref 26.0–34.0)
MCHC: 33.8 g/dL (ref 30.0–36.0)
MCV: 85.8 fL (ref 80.0–100.0)
Platelets: 202 10*3/uL (ref 150–400)
RBC: 4.79 MIL/uL (ref 3.87–5.11)
RDW: 12.3 % (ref 11.5–15.5)
WBC: 9.9 10*3/uL (ref 4.0–10.5)
nRBC: 0.2 % (ref 0.0–0.2)

## 2020-08-24 LAB — BASIC METABOLIC PANEL
Anion gap: 8 (ref 5–15)
BUN: 7 mg/dL (ref 6–20)
CO2: 18 mmol/L — ABNORMAL LOW (ref 22–32)
Calcium: 6.8 mg/dL — ABNORMAL LOW (ref 8.9–10.3)
Chloride: 113 mmol/L — ABNORMAL HIGH (ref 98–111)
Creatinine, Ser: 0.75 mg/dL (ref 0.44–1.00)
GFR, Estimated: >60 mL/min (ref >60)
Glucose, Bld: 90 mg/dL (ref 70–99)
Potassium: 2.6 mmol/L — CL (ref 3.5–5.1)
Sodium: 139 mmol/L (ref 135–145)

## 2020-08-24 LAB — TROPONIN I (HIGH SENSITIVITY): Troponin I (High Sensitivity): <2 ng/L (ref <18)

## 2020-08-24 LAB — MAGNESIUM: Magnesium: 1.4 mg/dL — ABNORMAL LOW (ref 1.7–2.4)

## 2020-08-24 MED ORDER — DICLOFENAC SODIUM 1 % EX GEL
2.0000 g | Freq: Four times a day (QID) | CUTANEOUS | Status: DC
  Administered 2020-08-25 – 2020-08-26 (×7): 2 g via TOPICAL
  Filled 2020-08-24 (×2): qty 100

## 2020-08-24 MED ORDER — KETOROLAC TROMETHAMINE 30 MG/ML IJ SOLN
15.0000 mg | Freq: Once | INTRAMUSCULAR | Status: AC
  Administered 2020-08-24: 15 mg via INTRAVENOUS
  Filled 2020-08-24: qty 1

## 2020-08-24 MED ORDER — ACETAMINOPHEN 325 MG PO TABS
650.0000 mg | ORAL_TABLET | Freq: Four times a day (QID) | ORAL | Status: DC | PRN
  Administered 2020-08-25 – 2020-08-26 (×3): 650 mg via ORAL
  Filled 2020-08-24 (×3): qty 2

## 2020-08-24 MED ORDER — POTASSIUM CHLORIDE 10 MEQ/100ML IV SOLN
10.0000 meq | Freq: Once | INTRAVENOUS | Status: AC
  Administered 2020-08-24: 10 meq via INTRAVENOUS
  Filled 2020-08-24: qty 100

## 2020-08-24 MED ORDER — POTASSIUM CHLORIDE 10 MEQ/100ML IV SOLN
10.0000 meq | INTRAVENOUS | Status: AC
  Administered 2020-08-25 (×4): 10 meq via INTRAVENOUS
  Filled 2020-08-24 (×4): qty 100

## 2020-08-24 MED ORDER — POTASSIUM CHLORIDE CRYS ER 20 MEQ PO TBCR
40.0000 meq | EXTENDED_RELEASE_TABLET | Freq: Once | ORAL | Status: DC
  Filled 2020-08-24: qty 2

## 2020-08-24 MED ORDER — PANTOPRAZOLE SODIUM 40 MG PO TBEC
40.0000 mg | DELAYED_RELEASE_TABLET | Freq: Every day | ORAL | Status: DC
  Administered 2020-08-25 – 2020-08-27 (×4): 40 mg via ORAL
  Filled 2020-08-24 (×4): qty 1

## 2020-08-24 MED ORDER — ALUM & MAG HYDROXIDE-SIMETH 200-200-20 MG/5ML PO SUSP
30.0000 mL | Freq: Once | ORAL | Status: AC
  Administered 2020-08-25: 30 mL via ORAL
  Filled 2020-08-24: qty 30

## 2020-08-24 MED ORDER — ENOXAPARIN SODIUM 40 MG/0.4ML ~~LOC~~ SOLN
40.0000 mg | SUBCUTANEOUS | Status: DC
  Administered 2020-08-25: 40 mg via SUBCUTANEOUS
  Filled 2020-08-24 (×2): qty 0.4

## 2020-08-24 MED ORDER — SODIUM CHLORIDE 0.9 % IV SOLN: INTRAVENOUS | Status: DC

## 2020-08-24 MED ORDER — ACETAMINOPHEN 650 MG RE SUPP: 650.0000 mg | Freq: Four times a day (QID) | RECTAL | Status: DC | PRN

## 2020-08-24 MED ORDER — SODIUM CHLORIDE 0.9% FLUSH
3.0000 mL | Freq: Two times a day (BID) | INTRAVENOUS | Status: DC
  Administered 2020-08-25 – 2020-08-26 (×5): 3 mL via INTRAVENOUS

## 2020-08-24 NOTE — Discharge Instructions (Addendum)
Laurie Bell,  You were in the hospital because of chest pain which appears to be secondary to reflux. Please follow-up with the GI physician. Please also find a PCP.

## 2020-08-24 NOTE — ED Triage Notes (Signed)
Patient is complaining of left chest pain that is going down left arm and into her back. Patient states this started two days ago.

## 2020-08-24 NOTE — H&P (Signed)
History and Physical   [name] GXQ:[id number] DOB: [birth year] DOA: 08/24/2020  PCP: Patient, No Pcp Per   Patient coming from: Home  Chief Complaint: Chest pain  HPI: Laurie Bell is a 40 y.o. female with medical history significant of fibroids, hypertension who presents with chest pain.  Report ongoing chest pain for last couple days.  She states the pain has been constant and is rated as a  3-5 out of 10.  The pain is described as a dull pain that radiates to her back and left arm.  She states the pain is not associated with exertion or rest.  Upon further questioning, he appears to have had 2 different chest pains.  1 chest pain feels deeper and was relieved with belching at home.  She was getting nauseous with her belching for relief of the pain and so in order to get relief yesterday she made herself throw up.  This did help that pain temporarily.  Her other chest pain is more of a chest wall tenderness both at her costochondral joints and at the posterior angle of her ribs on the left.  This location is tender to palpation.  Patient states that she does not have a PCP and is interested in referral.  She denies fever, shortness of breath, abdominal pain, constipation, diarrhea.  ED Course: Vital signs in ED were stable.  Lab work-up showed BMP with potassium of 2.6, bicarb 18, chloride 113, calcium 6.8.  CBC within normal limits.  Troponin negative with repeat pending.  Respiratory panel for flu and COVID's pending.  Urinalysis is pending as well.  Magnesium also has been added on and is pending.  Chest x-ray was within normal limits.  Patient was ordered 10 mg of IV potassium in the ED.  Review of Systems: As per HPI otherwise all other systems reviewed and are negative.  Past Medical History:  Diagnosis Date  . Chlamydia contact, treated   . No pertinent past medical history     Past Surgical History:  Procedure Laterality Date  . ENDOMETRIAL ABLATION N/A 04/25/2017    Procedure: ENDOMETRIAL ABLATION With NOVASURE;  Surgeon: Woodroe Mode, MD;  Location: [facility];  Service: Gynecology;  Laterality: N/A;  . TUBAL LIGATION      Social History  reports that she has quit smoking. She has never used smokeless tobacco. She reports that she does not drink alcohol and does not use drugs.  Allergies  Allergen Reactions  . Latex Other (See Comments)    Burn in vaginal area with latex condoms    Family History  Problem Relation Age of Onset  . Hypertension Mother   Reviewed on admission  Prior to Admission medications   Medication Sig Start Date End Date Taking? Authorizing Provider  hydrOXYzine (ATARAX/VISTARIL) 10 MG tablet Take 1 tablet (10 mg total) by mouth 3 (three) times daily as needed for anxiety. 02/02/20   Petrucelli, Samantha R, PA-C  naproxen (NAPROSYN) 500 MG tablet Take 1 tablet (500 mg total) by mouth 2 (two) times daily as needed for moderate pain. 02/02/20   Petrucelli, Samantha R, PA-C  potassium chloride SA (KLOR-CON) 20 MEQ tablet Take 1 tablet (20 mEq total) by mouth daily. 02/02/20   Petrucelli, Samantha R, PA-C  pseudoephedrine-acetaminophen (TYLENOL SINUS) 30-500 MG TABS tablet Take 2 tablets by mouth every 4 (four) hours as needed (sinus pain).    [provider]  traMADol (ULTRAM) 50 MG tablet Take 1 tablet (50 mg total)  by mouth every 6 (six) hours as needed. 06/05/19   Julianne Rice, MD  medroxyPROGESTERone (PROVERA) 10 MG tablet Take 2 tablets by mouth daily Patient not taking: Reported on 06/05/2019 02/06/18 02/02/20  Sloan Leiter, MD    Physical Exam: Vitals:   08/24/20 2123 08/24/20 2124 08/24/20 2250 08/24/20 2321  BP: (!) 143/98  109/80 107/80  Pulse: 96  78 75  Resp: 18  18 18   Temp: (!) 97.5 F (36.4 C)     TempSrc: Oral     SpO2: 97%  100% 99%  Weight:  86.2 kg    Height:  5\' 6"  (1.676 m)     Physical Exam Constitutional:      General: She is not in acute distress.    Appearance:  Normal appearance.  HENT:     Head: Normocephalic and atraumatic.     Mouth/Throat:     Mouth: Mucous membranes are moist.     Pharynx: Oropharynx is clear.  Eyes:     Extraocular Movements: Extraocular movements intact.     Pupils: Pupils are equal, round, and reactive to light.  Cardiovascular:     Rate and Rhythm: Normal rate and regular rhythm.     Pulses: Normal pulses.     Heart sounds: Normal heart sounds.  Pulmonary:     Effort: Pulmonary effort is normal. No respiratory distress.     Breath sounds: Normal breath sounds.  Abdominal:     General: Bowel sounds are normal. There is no distension.     Palpations: Abdomen is soft.     Tenderness: There is no abdominal tenderness.  Musculoskeletal:        General: Tenderness (  Tenderness along left costochondral joints and left posterior rib angle) present. No swelling or deformity.  Skin:    General: Skin is warm and dry.  Neurological:     General: No focal deficit present.     Mental Status: Mental status is at baseline.    Labs on Admission: I have personally reviewed following labs and imaging studies  CBC: Recent Labs  Lab 08/24/20 2150  WBC 9.9  HGB 13.9  HCT 41.1  MCV 85.8  PLT 409    Basic Metabolic Panel: Recent Labs  Lab 08/24/20 2150 08/24/20 2254  NA 139  --   K 2.6*  --   CL 113*  --   CO2 18*  --   GLUCOSE 90  --   BUN 7  --   CREATININE 0.75  --   CALCIUM 6.8*  --   MG  --  1.4*    GFR: Estimated Creatinine Clearance: 103.4 mL/min (by C-G formula based on SCr of 0.75 mg/dL).  Liver Function Tests: No results for input(s): AST, ALT, ALKPHOS, BILITOT, PROT, ALBUMIN in the last 168 hours.  Urine analysis:    Component Value Date/Time   COLORURINE YELLOW 12/02/2018 1907   APPEARANCEUR CLOUDY (A) 12/02/2018 1907   LABSPEC >=1.030 02/06/2019 1925   PHURINE 7.0 02/06/2019 1925   GLUCOSEU NEGATIVE 02/06/2019 1925   HGBUR MODERATE (A) 02/06/2019 1925   BILIRUBINUR NEGATIVE 02/06/2019  1925   KETONESUR NEGATIVE 02/06/2019 1925   PROTEINUR NEGATIVE 02/06/2019 1925   UROBILINOGEN 0.2 02/06/2019 1925   NITRITE NEGATIVE 02/06/2019 1925   LEUKOCYTESUR NEGATIVE 02/06/2019 1925    Radiological Exams on Admission: DG Chest 2 View  Result Date: 08/24/2020 CLINICAL DATA:  Chest pain EXAM: CHEST - 2 VIEW COMPARISON:  02/01/2020 FINDINGS: The heart size and mediastinal  contours are within normal limits. Both lungs are clear. The visualized skeletal structures are unremarkable. IMPRESSION: No active cardiopulmonary disease. Electronically Signed   By: Donavan Foil M.D.   On: 08/24/2020 22:05   EKG: EKG reviewed due to technical difficulties with EMR.  Assessment/Plan Principal Problem:   Hypokalemia Active Problems:   Chest pain   Nausea and vomiting   Hypocalcemia  Hypokalemia Hypocalcemia > Patient found to have potassium of 2.6 lab work-up.  She has a history of hypokalemia with potassium of 3 is typically.  She made herself throw up yesterday which may contributed. > She states that she had been prescribed potassium the past during urgent care or ED visit but she does not have a PCP for regular follow-up.  She is interested in a PCP. > Calcium also noted to be 6.8, albumin not known. - Monitor overnight on telemetry - Give total of 5 rounds of IV potassium and 40 mEq of p.o. potassium - Follow-up magnesium, replete as needed - Check LFTs to evaluate albumin - Trend renal and electrolytes - Social work consult for PCP  Chest pain > Patient initially presented with left upper chest pain.  Troponin negative with repeat pending.  I was unable to review EKG but ED provider did not see ischemic.  Heart score of 3.  Low suspicion for any ischemic etiology. > She definitely has chest wall pain of a musculoskeletal etiology likely costochondritis.  She also may have a component of reflux contributing to her deeper pain which was relieved with belching. - Follow-up repeat  troponin - Voltaren gel for chest wall pain - Maalox and PPI for possible reflux  DVT prophylaxis: Lovenox Code Status:   Full  Family Communication:  None on admission Disposition Plan:   Patient is from:  Home  Anticipated DC to:  Home  Anticipated DC date:  1 day  Anticipated DC barriers: None  Consults called:  None  Admission status:  Observation, telemetry  Severity of Illness: The appropriate patient status for this patient is OBSERVATION. Observation status is judged to be reasonable and necessary in order to provide the required intensity of service to ensure the patient's safety. The patient's presenting symptoms, physical exam findings, and initial radiographic and laboratory data in the context of their medical condition is felt to place them at decreased risk for further clinical deterioration. Furthermore, it is anticipated that the patient will be medically stable for discharge from the hospital within 2 midnights of admission. The following factors support the patient status of observation.   " The patient's presenting symptoms include chest pain, nausea, vomiting. " The physical exam findings include chest wall tenderness. " The initial radiographic and laboratory data are potassium 2.6, bicarb 18.Marcelyn Bruins MD [facility]  How to contact the [facility] Attending or Consulting provider [place] or covering provider during after hours [place], for this patient?   1. Check the care team in [facility] and look for a) attending/consulting [facility] provider listed and b) the [facility] team listed 2. Log into [email or web address] and use [address]'s universal password to access. If you do not have the password, please contact the hospital operator. 3. Locate the [facility] provider you are looking for under [facility] and page to a number that you can be directly reached. 4. If you still have difficulty reaching the provider, please page the [facility] (Director on Call) for the Hospitalists  listed on amion for assistance.  08/24/2020, 11:38 PM

## 2020-08-24 NOTE — ED Provider Notes (Addendum)
[facility] DEPT Provider Note   CSN: [id number] Arrival date & time: 08/24/20  2119     History Chief Complaint  Patient presents with  . Chest Pain    Laurie Bell is a 40 y.o. female.  HPI Patient presents with chest pain.  Left upper chest.  Has had for the last few days.  Has been constant.  Not worse with exertion.  States she has limited her exertion somewhat though because the pain is been there constantly.  It is dull.  Upper chest and goes to the back and left arm.  No cough.  No shortness of breath.  No swelling her legs.  No known sick contacts.  Does not smoke and has no known cardiac history.  No family history of early coronary artery disease.  No history of hypertension.    Past Medical History:  Diagnosis Date  . Chlamydia contact, treated   . No pertinent past medical history     Patient Active Problem List   Diagnosis Date Noted  . DUB (dysfunctional uterine bleeding) 01/12/2017  . 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 ENDOMETRIAL ABLATION N/A 04/25/2017   Procedure: ENDOMETRIAL ABLATION With NOVASURE;  Surgeon: Woodroe Mode, MD;  Location: [facility];  Service: Gynecology;  Laterality: N/A;  . TUBAL LIGATION       OB History    Gravida  2   Para  2   Term  2   Preterm  0   AB  0   Living  2     SAB  0   IAB  0   Ectopic  0   Multiple  0   Live Births              Family History  Problem Relation Age of Onset  . Hypertension Mother     Social History   Tobacco Use  . Smoking status: Former Research scientist (life sciences)  . Smokeless tobacco: Never Used  Vaping Use  . Vaping Use: Never used  Substance Use Topics  . Alcohol use: No  . Drug use: No    Home Medications Prior to Admission medications   Medication Sig Start Date End Date Taking? Authorizing Provider  hydrOXYzine (ATARAX/VISTARIL) 10 MG tablet Take 1 tablet (10 mg total) by  mouth 3 (three) times daily as needed for anxiety. 02/02/20   Petrucelli, Samantha R, PA-C  naproxen (NAPROSYN) 500 MG tablet Take 1 tablet (500 mg total) by mouth 2 (two) times daily as needed for moderate pain. 02/02/20   Petrucelli, Samantha R, PA-C  potassium chloride SA (KLOR-CON) 20 MEQ tablet Take 1 tablet (20 mEq total) by mouth daily. 02/02/20   Petrucelli, Samantha R, PA-C  pseudoephedrine-acetaminophen (TYLENOL SINUS) 30-500 MG TABS tablet Take 2 tablets by mouth every 4 (four) hours as needed (sinus pain).    [provider]  traMADol (ULTRAM) 50 MG tablet Take 1 tablet (50 mg total) by mouth every 6 (six) hours as needed. 06/05/19   Julianne Rice, MD  medroxyPROGESTERone (PROVERA) 10 MG tablet Take 2 tablets by mouth daily Patient not taking: Reported on 06/05/2019 02/06/18 02/02/20  Sloan Leiter, MD    Allergies    Latex  Review of Systems   Review of Systems  Constitutional: Negative for appetite change.  HENT: Negative for congestion.   Respiratory: Negative for shortness of breath.   Cardiovascular: Positive for chest pain. Negative for leg  swelling.  Gastrointestinal: Negative for abdominal pain.  Genitourinary: Negative for flank pain.  Musculoskeletal: Positive for back pain.  Skin: Negative for rash.  Neurological: Negative for weakness and numbness.  Psychiatric/Behavioral: Negative for confusion.    Physical Exam Updated Vital Signs BP 109/80 (BP Location: Left Arm)   Pulse 78   Temp (!) 97.5 F (36.4 C) (Oral)   Resp 18   Ht 5\' 6"  (1.676 m)   Wt 86.2 kg   SpO2 100%   BMI 30.67 kg/m   Physical Exam Vitals and nursing note reviewed.  Constitutional:      Appearance: She is well-developed.  HENT:     Head: Normocephalic.  Cardiovascular:     Rate and Rhythm: Normal rate and regular rhythm.  Pulmonary:     Breath sounds: No wheezing or rhonchi.  Chest:     Chest wall: Tenderness present.     Comments: Tenderness to left upper anterior  chest wall.  No deformity.  No rash. Abdominal:     Tenderness: There is no abdominal tenderness.  Musculoskeletal:     Cervical back: Neck supple.     Right lower leg: No tenderness. No edema.     Left lower leg: No tenderness. No edema.  Skin:    General: Skin is warm.     Capillary Refill: Capillary refill takes less than 2 seconds.  Neurological:     Mental Status: She is alert and oriented to person, place, and time.     ED Results / Procedures / Treatments   Labs (all labs ordered are listed, but only abnormal results are displayed) Labs Reviewed  BASIC METABOLIC PANEL - Abnormal; Notable for the following components:      Result Value   Potassium 2.6 (*)    Chloride 113 (*)    CO2 18 (*)    Calcium 6.8 (*)    All other components within normal limits  CBC  MAGNESIUM  I-STAT BETA HCG BLOOD, ED ([facility], [facility], [facility] ONLY)  TROPONIN I (HIGH SENSITIVITY)  TROPONIN I (HIGH SENSITIVITY)    EKG EKG Interpretation  Date/Time:  Tuesday August 24 2020 21:24:47 EDT Ventricular Rate:  93 PR Interval:    QRS Duration: 67 QT Interval:  390 QTC Calculation: 486 R Axis:   63 Text Interpretation: Sinus rhythm Multiform ventricular premature complexes Anterior infarct, old Borderline T abnormalities, inferior leads 12 Lead; [place] Confirmed by Davonna Belling [phone]) on 08/24/2020 9:31:08 PM   Radiology DG Chest 2 View  Result Date: 08/24/2020 CLINICAL DATA:  Chest pain EXAM: CHEST - 2 VIEW COMPARISON:  02/01/2020 FINDINGS: The heart size and mediastinal contours are within normal limits. Both lungs are clear. The visualized skeletal structures are unremarkable. IMPRESSION: No active cardiopulmonary disease. Electronically Signed   By: Donavan Foil M.D.   On: 08/24/2020 22:05    Procedures Procedures   Medications Ordered in ED Medications  ketorolac (TORADOL) 30 MG/ML injection 15 mg (has no administration in time range)  potassium chloride 10 mEq in 100 mL IVPB (has no  administration in time range)    ED Course  I have reviewed the triage vital signs and the nursing notes.  Pertinent labs & imaging results that were available during my care of the patient were reviewed by me and considered in my medical decision making (see chart for details).    MDM Rules/Calculators/A&P  Patient presents with chest pain.  Has had for the last few days.  Left upper chest.  Some radiation to the jaw back and arm.  EKG reassuring overall.Chest x-ray reassuring.  Heart pathway score of 3.  Troponin still pending but if negative should be able to discharge home.  Reproducible anterior pain.  Doubt pulmonary embolism or aortic dissection. Patient found to be hypokalemic and hypocalcemic.  Has really not had much oral intake due to the nausea and chest pain.  With this decreased oral intake and no follow-up I feels the patient benefit for admission the hospital for adjustment of the electrolyte abnormalities.  Final Clinical Impression(s) / ED Diagnoses Final diagnoses:  Nonspecific chest pain  Hypokalemia  Hypocalcemia    Rx / DC Orders ED Discharge Orders    None       Davonna Belling, MD 08/24/20 2248    Davonna Belling, MD 08/24/20 2253

## 2020-08-25 ENCOUNTER — Inpatient Hospital Stay (HOSPITAL_COMMUNITY): Payer: Self-pay

## 2020-08-25 DIAGNOSIS — R112 Nausea with vomiting, unspecified: Secondary | ICD-10-CM

## 2020-08-25 DIAGNOSIS — E876 Hypokalemia: Principal | ICD-10-CM

## 2020-08-25 LAB — COMPREHENSIVE METABOLIC PANEL
ALT: 14 U/L (ref 0–44)
ALT: 14 U/L (ref 0–44)
AST: 19 U/L (ref 15–41)
AST: 18 U/L (ref 15–41)
Albumin: 3.8 g/dL (ref 3.5–5.0)
Albumin: 3.6 g/dL (ref 3.5–5.0)
Alkaline Phosphatase: 95 U/L (ref 38–126)
Alkaline Phosphatase: 89 U/L (ref 38–126)
Anion gap: 8 (ref 5–15)
Anion gap: 11 (ref 5–15)
BUN: 8 mg/dL (ref 6–20)
BUN: 8 mg/dL (ref 6–20)
CO2: 24 mmol/L (ref 22–32)
CO2: 22 mmol/L (ref 22–32)
Calcium: 8.7 mg/dL — ABNORMAL LOW (ref 8.9–10.3)
Calcium: 8.5 mg/dL — ABNORMAL LOW (ref 8.9–10.3)
Chloride: 105 mmol/L (ref 98–111)
Chloride: 105 mmol/L (ref 98–111)
Creatinine, Ser: 0.74 mg/dL (ref 0.44–1.00)
Creatinine, Ser: 0.89 mg/dL (ref 0.44–1.00)
GFR, Estimated: >60 mL/min (ref >60)
GFR, Estimated: >60 mL/min (ref >60)
Glucose, Bld: 100 mg/dL — ABNORMAL HIGH (ref 70–99)
Glucose, Bld: 108 mg/dL — ABNORMAL HIGH (ref 70–99)
Potassium: 3.3 mmol/L — ABNORMAL LOW (ref 3.5–5.1)
Potassium: 3.6 mmol/L (ref 3.5–5.1)
Sodium: 137 mmol/L (ref 135–145)
Sodium: 138 mmol/L (ref 135–145)
Total Bilirubin: 0.8 mg/dL (ref 0.3–1.2)
Total Bilirubin: 0.7 mg/dL (ref 0.3–1.2)
Total Protein: 7.2 g/dL (ref 6.5–8.1)
Total Protein: 6.9 g/dL (ref 6.5–8.1)

## 2020-08-25 LAB — URINALYSIS, ROUTINE W REFLEX MICROSCOPIC
Bacteria, UA: NONE SEEN
Bilirubin Urine: NEGATIVE
Glucose, UA: NEGATIVE mg/dL
Ketones, ur: NEGATIVE mg/dL
Leukocytes,Ua: NEGATIVE
Nitrite: NEGATIVE
Protein, ur: NEGATIVE mg/dL
Specific Gravity, Urine: 1.013 (ref 1.005–1.030)
pH: 6 (ref 5.0–8.0)

## 2020-08-25 LAB — CBC
HCT: 37 % (ref 36.0–46.0)
HCT: 37.9 % (ref 36.0–46.0)
Hemoglobin: 12.5 g/dL (ref 12.0–15.0)
Hemoglobin: 13 g/dL (ref 12.0–15.0)
MCH: 29.4 pg (ref 26.0–34.0)
MCH: 29.5 pg (ref 26.0–34.0)
MCHC: 33.8 g/dL (ref 30.0–36.0)
MCHC: 34.3 g/dL (ref 30.0–36.0)
MCV: 85.9 fL (ref 80.0–100.0)
MCV: 87.1 fL (ref 80.0–100.0)
Platelets: 207 10*3/uL (ref 150–400)
Platelets: 221 10*3/uL (ref 150–400)
RBC: 4.25 MIL/uL (ref 3.87–5.11)
RBC: 4.41 MIL/uL (ref 3.87–5.11)
RDW: 12.4 % (ref 11.5–15.5)
RDW: 12.6 % (ref 11.5–15.5)
WBC: 7.4 10*3/uL (ref 4.0–10.5)
WBC: 9.3 10*3/uL (ref 4.0–10.5)
nRBC: 0 % (ref 0.0–0.2)
nRBC: 0 % (ref 0.0–0.2)

## 2020-08-25 LAB — MAGNESIUM: Magnesium: 1.9 mg/dL (ref 1.7–2.4)

## 2020-08-25 LAB — HEPATIC FUNCTION PANEL
ALT: 14 U/L (ref 0–44)
AST: 47 U/L — ABNORMAL HIGH (ref 15–41)
Albumin: 3.8 g/dL (ref 3.5–5.0)
Alkaline Phosphatase: 95 U/L (ref 38–126)
Bilirubin, Direct: 0.1 mg/dL (ref 0.0–0.2)
Indirect Bilirubin: 0.5 mg/dL (ref 0.3–0.9)
Total Bilirubin: 0.6 mg/dL (ref 0.3–1.2)
Total Protein: 7.1 g/dL (ref 6.5–8.1)

## 2020-08-25 LAB — TROPONIN I (HIGH SENSITIVITY): Troponin I (High Sensitivity): 3 ng/L (ref <18)

## 2020-08-25 LAB — PREGNANCY, URINE: Preg Test, Ur: NEGATIVE

## 2020-08-25 LAB — I-STAT BETA HCG BLOOD, ED (MC, WL, AP ONLY): I-stat hCG, quantitative: <5 m[IU]/mL (ref <5)

## 2020-08-25 LAB — SARS CORONAVIRUS 2 (TAT 6-24 HRS): SARS Coronavirus 2: NEGATIVE

## 2020-08-25 LAB — HIV ANTIBODY (ROUTINE TESTING W REFLEX): HIV Screen 4th Generation wRfx: NONREACTIVE

## 2020-08-25 LAB — SEDIMENTATION RATE: Sed Rate: 10 mm/h (ref 0–22)

## 2020-08-25 LAB — C-REACTIVE PROTEIN: CRP: 0.6 mg/dL

## 2020-08-25 MED ORDER — SUCRALFATE 1 GM/10ML PO SUSP
1.0000 g | Freq: Three times a day (TID) | ORAL | Status: DC
  Administered 2020-08-25 – 2020-08-27 (×9): 1 g via ORAL
  Filled 2020-08-25 (×8): qty 10

## 2020-08-25 MED ORDER — POLYETHYLENE GLYCOL 3350 17 G PO PACK
17.0000 g | PACK | Freq: Every day | ORAL | Status: DC
  Administered 2020-08-25 – 2020-08-26 (×2): 17 g via ORAL
  Filled 2020-08-25 (×2): qty 1

## 2020-08-25 MED ORDER — MELATONIN 5 MG PO TABS
5.0000 mg | ORAL_TABLET | Freq: Every day | ORAL | Status: DC
  Administered 2020-08-25 – 2020-08-26 (×2): 5 mg via ORAL
  Filled 2020-08-25 (×2): qty 1

## 2020-08-25 MED ORDER — SUCRALFATE 1 GM/10ML PO SUSP: 1.0000 g | Freq: Three times a day (TID) | ORAL | Status: DC

## 2020-08-25 MED ORDER — MORPHINE SULFATE (PF) 2 MG/ML IV SOLN: 2.0000 mg | INTRAVENOUS | Status: DC | PRN

## 2020-08-25 NOTE — Progress Notes (Signed)
PROGRESS NOTE    Laurie Bell  JOA:[id number] DOB: 01/18/1981 DOA: 08/24/2020 PCP: Patient, No Pcp Per   Brief Narrative: Laurie Bell is a 39 y.o. female with a history of fibroids. She presented secondary to worsening substernal chest pain. Unsure of etiology at this time. Workup ongoing.   Assessment & Plan:   Principal Problem:   Hypokalemia Active Problems:   Chest pain   Nausea and vomiting   Hypocalcemia   Chest pain Seems atypical. Unknown etiology. Cardiac workup so far is negative. EKG is non-ischemic. Troponin negative. No associated dyspnea or tachycardia to suggest PE. Patient with associated left shoulder/arm radiation. Also appears to have a combination of chest-wall pain. -Transthoracic Echocardiogram ordered and pending -Upper GI ordered and pending -Continue Protonix, add Carafate -Morphine IV prn -Voltaren gel for chest wall pain  Nausea/vomiting Unknown etiology. Appears to have resolved.  Hypokalemia Likely contributed to by nausea/vomiting. Supplementation given with resolution.  Hypocalcemia Transient. Slightly low but minimal. Outpatient follow-up.   DVT prophylaxis: Lovenox Code Status:   Code Status: Full Code Family Communication: None at bedside Disposition Plan: Discharge likely in 24 hours pending Transthoracic Echocardiogram / upper GI   Consultants:   None  Procedures:   None  Antimicrobials:  None    Subjective: Continued substernal chest pain that radiates to left shoulder. Pain feels like pressure. It is slightly better but not resolved and still concerning to patient.  Objective: Vitals:   08/25/20 0600 08/25/20 0700 08/25/20 0753 08/25/20 1146  BP: 105/82 116/82 124/81 104/74  Pulse: 77 69 77 79  Resp: 12 16 16 18   Temp:   98.2 F (36.8 C) 98.2 F (36.8 C)  TempSrc:   Oral Oral  SpO2: 97% 98% 99% 100%  Weight:      Height:        Intake/Output Summary (Last 24 hours) at 08/25/2020 1635 Last  data filed at 08/25/2020 0253 Gross per 24 hour  Intake 187.16 ml  Output --  Net 187.16 ml   Filed Weights   08/24/20 2124  Weight: 86.2 kg    Examination:  General exam: Appears calm but slightly uncomfortable and in no acute distress. Conversant Respiratory: Clear to auscultation. Respiratory effort normal with no intercostal retractions or use of accessory muscles Cardiovascular: S1 & S2 heard, RRR. No murmurs, rubs, gallops or clicks. No edema Gastrointestinal: Abdomen is nondistended, soft and tender in epigastric area. No masses felt. Normal bowel sounds heard Neurologic: No focal neurological deficits Musculoskeletal: No calf tenderness. Reproducible chest wall pain Skin: No cyanosis. No new rashes Psychiatry: Alert and oriented. Memory intact. Mood & affect appropriate    Data Reviewed: I have personally reviewed following labs and imaging studies  CBC Lab Results  Component Value Date   WBC 7.4 08/25/2020   RBC 4.25 08/25/2020   HGB 12.5 08/25/2020   HCT 37.0 08/25/2020   MCV 87.1 08/25/2020   MCH 29.4 08/25/2020   PLT 207 08/25/2020   MCHC 33.8 08/25/2020   RDW 12.6 08/25/2020   LYMPHSABS 2.9 01/09/2018   MONOABS 0.6 01/09/2018   EOSABS 0.2 01/09/2018   BASOSABS 0.0 60/03/9322     Last metabolic panel Lab Results  Component Value Date   NA 138 08/25/2020   K 3.6 08/25/2020   CL 105 08/25/2020   CO2 22 08/25/2020   BUN 8 08/25/2020   CREATININE 0.89 08/25/2020   GLUCOSE 108 (H) 08/25/2020   GFRNONAA >60 08/25/2020   GFRAA >60 02/01/2020  CALCIUM 8.5 (L) 08/25/2020   PHOS 2.8 12/24/2007   PROT 6.9 08/25/2020   ALBUMIN 3.6 08/25/2020   BILITOT 0.7 08/25/2020   ALKPHOS 89 08/25/2020   AST 18 08/25/2020   ALT 14 08/25/2020   ANIONGAP 11 08/25/2020    CBG (last 3)  No results for input(s): GLUCAP in the last 72 hours.   GFR: Estimated Creatinine Clearance: 93 mL/min (by C-G formula based on SCr of 0.89 mg/dL).  Coagulation Profile: No  results for input(s): INR, PROTIME in the last 168 hours.  Recent Results (from the past 240 hour(s))  SARS CORONAVIRUS 2 (TAT 6-24 HRS) Nasopharyngeal Nasopharyngeal Swab     Status: None   Collection Time: 08/24/20 11:15 PM   Specimen: Nasopharyngeal Swab  Result Value Ref Range Status   SARS Coronavirus 2 NEGATIVE NEGATIVE Final    Comment: (NOTE) SARS-CoV-2 target nucleic acids are NOT DETECTED.  The SARS-CoV-2 RNA is generally detectable in upper and lower respiratory specimens during the acute phase of infection. Negative results do not preclude SARS-CoV-2 infection, do not rule out co-infections with other pathogens, and should not be used as the sole basis for treatment or other patient management decisions. Negative results must be combined with clinical observations, patient history, and epidemiological information. The expected result is Negative.  Fact Sheet for Patients: SugarRoll.be  Fact Sheet for Healthcare Providers: https://www.woods-mathews.com/  This test is not yet approved or cleared by the [place] FDA and  has been authorized for detection and/or diagnosis of SARS-CoV-2 by FDA under an Emergency Use Authorization (EUA). This EUA will remain  in effect (meaning this test can be used) for the duration of the COVID-19 declaration under Se ction 564(b)(1) of the Act, 21 U.S.C. section 360bbb-3(b)(1), unless the authorization is terminated or revoked sooner.  Performed at [facility], [address], [place], [place] [address]         Radiology Studies: DG Chest 2 View  Result Date: 08/24/2020 CLINICAL DATA:  Chest pain EXAM: CHEST - 2 VIEW COMPARISON:  02/01/2020 FINDINGS: The heart size and mediastinal contours are within normal limits. Both lungs are clear. The visualized skeletal structures are unremarkable. IMPRESSION: No active cardiopulmonary disease. Electronically Signed   By: Donavan Foil  M.D.   On: 08/24/2020 22:05   DG Abd 1 View  Result Date: 08/25/2020 CLINICAL DATA:  Epigastric pain. Upper GI requested. Patient not NPO for required 6-8 hours. EXAM: ABDOMEN - 1 VIEW COMPARISON:  CT of 12/21/2016 FINDINGS: Two supine views of the abdomen and pelvis. There is food within the stomach, precluding upper GI. No free intraperitoneal air. Nonobstructive bowel gas pattern. No abnormal abdominal calcifications. No appendicolith. Presumed phleboliths in the pelvis. IMPRESSION: The patient was not NPO for the required 6-8 hours and food is seen within the stomach, precluding upper GI. No acute findings. Electronically Signed   By: Abigail Miyamoto M.D.   On: 08/25/2020 16:05        Scheduled Meds: . diclofenac Sodium  2 g Topical QID  . enoxaparin (LOVENOX) injection  40 mg Subcutaneous Q24H  . pantoprazole  40 mg Oral Daily  . polyethylene glycol  17 g Oral Daily  . potassium chloride  40 mEq Oral Once  . sodium chloride flush  3 mL Intravenous Q12H  . sucralfate  1 g Oral TID WC & HS   Continuous Infusions:   LOS: 0 days     Cordelia Poche, MD [facility] 08/25/2020, 4:35 PM  If 7PM-7AM, please contact night-coverage [email or web address]
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
## 2020-08-26 ENCOUNTER — Inpatient Hospital Stay (HOSPITAL_COMMUNITY): Payer: Self-pay

## 2020-08-26 DIAGNOSIS — K219 Gastro-esophageal reflux disease without esophagitis: Secondary | ICD-10-CM

## 2020-08-26 DIAGNOSIS — R079 Chest pain, unspecified: Secondary | ICD-10-CM

## 2020-08-26 LAB — ECHOCARDIOGRAM COMPLETE
Area-P 1/2: 4.49 cm2
Height: 66 in
S' Lateral: 2.71 cm
Weight: 3040 oz

## 2020-08-26 MED ORDER — BISACODYL 10 MG RE SUPP
10.0000 mg | Freq: Once | RECTAL | Status: AC
  Administered 2020-08-26: 10 mg via RECTAL
  Filled 2020-08-26: qty 1

## 2020-08-26 MED ORDER — POLYETHYLENE GLYCOL 3350 17 G PO PACK
17.0000 g | PACK | Freq: Two times a day (BID) | ORAL | Status: DC
  Filled 2020-08-26 (×2): qty 1

## 2020-08-26 NOTE — Consult Note (Signed)
Referring Provider: No ref. provider found Primary Care Physician:  Patient, No Pcp Per Primary Gastroenterologist:  Unassigned   Reason for Consultation:  Chest pain, LUQ pain   HPI: Laurie Bell is a 40 y.o. female with a history of uterine fibroids s/p endometrial ablation in 2018.  Past tubal ligation.  COVID-19 positive 05/2020.  She developed chest and left upper quadrant pressure type pain 3 days ago.  She felt as if she needed to burp but could not.  She developed a foul acid taste in her mouth 3 days ago with worsening chest pressure.  She self induced vomiting on Monday 3/14 to alleviate the gas pressure.  Her chest and left upper quadrant abdominal pain persisted and the next morning she had left upper back pain with numbness to her left arm.  She presented to  [facility] ED 08/24/2020 for further evaluation.  Labs in the ED showed a sodium level 139.  Potassium 2.6.  BUN 7.  Creatinine 0.75.  Magnesium 1.4.  Troponin less than 2.  WBC 9.9.  Hemoglobin 13.9.  Hematocrit 41.1.  Platelet 202.  Twelve-lead EKG without evidence of acute ischemia.  Chest x-ray was negative.  She received IV KCl, Pantoprazole and Carafate.  An upper GI series today showed moderate gastroesophageal reflux and a small hiatal hernia.  An echo showed LVEF 65 to 70%.  She denies having a prior history of GERD.  No dysphagia.  She reported taking aspirin 1 tablet daily for 3 or 4 days when she had Covid 05/2020.  She did not take any further aspirin until Monday the/14/2022, she took 1 aspirin tablet because she thought it was good for her heart.  No other NSAIDs.  No alcohol use.  No drug use.  No cough or shortness of breath.  No nausea or vomiting.  She often eats a larger dinner meal and goes to bed approximately 2 hours later.  She is passing a normal formed brown bowel movement once or twice daily, however, she has not had a bowel movement in 3 to 4 days.  No rectal bleeding or melena.  No family history of esophageal  or gastric cancer.   Upper GI series 08/26/2020: Moderate gastroesophageal reflux Negative for peptic ulcer disease. 3 mm calcification to the left of L2-3 disc space is new since yesterday. Question symptoms of left renal colic.  Echo 08/26/2020: 1. Left ventricular ejection fraction, by estimation, is 65 to 70%. The left ventricle has normal function. The left ventricle has no regional wall motion abnormalities. Left ventricular diastolic parameters were normal. 2. Right ventricular systolic function is normal. The right ventricular size is normal. 3. The mitral valve is normal in structure. Trivial mitral valve regurgitation. 4. The aortic valve is normal in structure. Aortic valve regurgitation is not visualized.   Past Medical History:  Diagnosis Date  . Chlamydia contact, treated   . No pertinent past medical history     Past Surgical History:  Procedure Laterality Date  . ENDOMETRIAL ABLATION N/A 04/25/2017   Procedure: ENDOMETRIAL ABLATION With NOVASURE;  Surgeon: Woodroe Mode, MD;  Location: [place];  Service: Gynecology;  Laterality: N/A;  . TUBAL LIGATION      Prior to Admission medications   Medication Sig Start Date End Date Taking? Authorizing Provider  hydrOXYzine (ATARAX/VISTARIL) 10 MG tablet Take 1 tablet (10 mg total) by mouth 3 (three) times daily as needed for anxiety. 02/02/20   Petrucelli, Glynda Jaeger, PA-C  medroxyPROGESTERone (PROVERA)  10 MG tablet Take 2 tablets by mouth daily Patient not taking: Reported on 06/05/2019 02/06/18 02/02/20  Sloan Leiter, MD    Current Facility-Administered Medications  Medication Dose Route Frequency Provider Last Rate Last Admin  . acetaminophen (TYLENOL) tablet 650 mg  650 mg Oral Q6H PRN Marcelyn Bruins, MD   650 mg at 08/26/20 1304   Or  . acetaminophen (TYLENOL) suppository 650 mg  650 mg Rectal Q6H PRN Marcelyn Bruins, MD      . diclofenac Sodium (VOLTAREN) 1 % topical gel 2 g  2 g  Topical QID Marcelyn Bruins, MD   2 g at 08/26/20 [phone]  . enoxaparin (LOVENOX) injection 40 mg  40 mg Subcutaneous Q24H Marcelyn Bruins, MD   40 mg at 08/25/20 1003  . melatonin tablet 5 mg  5 mg Oral QHS Zierle-Ghosh, Asia B, DO   5 mg at 08/25/20 2334  . morphine 2 MG/ML injection 2 mg  2 mg Intravenous Q4H PRN Mariel Aloe, MD      . pantoprazole (PROTONIX) EC tablet 40 mg  40 mg Oral Daily Marcelyn Bruins, MD   40 mg at 08/26/20 4128  . polyethylene glycol (MIRALAX / GLYCOLAX) packet 17 g  17 g Oral Daily Mariel Aloe, MD   17 g at 08/26/20 0939  . potassium chloride SA (KLOR-CON) CR tablet 40 mEq  40 mEq Oral Once Marcelyn Bruins, MD      . sodium chloride flush (NS) 0.9 % injection 3 mL  3 mL Intravenous Q12H Marcelyn Bruins, MD   3 mL at 08/26/20 0939  . sucralfate (CARAFATE) 1 GM/10ML suspension 1 g  1 g Oral TID WC & HS Mariel Aloe, MD   1 g at 08/26/20 1305    Allergies as of 08/24/2020 - Review Complete 08/24/2020  Allergen Reaction Noted  . Latex Other (See Comments) 04/18/2017    Family History  Problem Relation Age of Onset  . Hypertension Mother     Social History   Socioeconomic History  . Marital status: Single    Spouse name: Not on file  . Number of children: Not on file  . Years of education: Not on file  . Highest education level: Not on file  Occupational History  . Not on file  Tobacco Use  . Smoking status: Former Research scientist (life sciences)  . Smokeless tobacco: Never Used  Vaping Use  . Vaping Use: Never used  Substance and Sexual Activity  . Alcohol use: No  . Drug use: No  . Sexual activity: Not on file  Other Topics Concern  . Not on file  Social History Narrative  . Not on file   Social Determinants of Health   Financial Resource Strain: Not on file  Food Insecurity: Not on file  Transportation Needs: Not on file  Physical Activity: Not on file  Stress: Not on file  Social Connections: Not on file  Intimate Partner Violence:  Not on file    Review of Systems: Gen: Denies fever, sweats or chills. No weight loss.  CV: See HPI. Resp: Denies cough, shortness of breath of hemoptysis.  GI: See HPI. GU : Denies urinary burning, blood in urine, increased urinary frequency or incontinence. MS: Denies joint pain, muscles aches or weakness. Derm: Denies rash, itchiness, skin lesions or unhealing ulcers. Psych: + Anxiety.  Heme: Denies easy bruising, bleeding. Neuro:  Denies headaches, dizziness or paresthesias. Endo:  Denies any problems with DM,  thyroid or adrenal function.  Physical Exam: Vital signs in last 24 hours: Temp:  [97.5 F (36.4 C)-98.6 F (37 C)] 98 F (36.7 C) (03/17 1409) Pulse Rate:  [65-81] 79 (03/17 1409) Resp:  [16-17] 17 (03/17 1409) BP: (92-118)/(57-83) 118/82 (03/17 1409) SpO2:  [98 %-100 %] 99 % (03/17 1409) Last BM Date: 08/25/20 General:  Alert 40 year old female in no acute distress. Head:  Normocephalic and atraumatic. Eyes:  No scleral icterus. Conjunctiva pink. Ears:  Normal auditory acuity. Nose:  No deformity, discharge or lesions. Mouth: Upper dentures.  No ulcers or lesions.  Neck:  Supple. No lymphadenopathy or thyromegaly.  Lungs: Breath sounds clear throughout. Heart: Regular rate and rhythm, no murmurs. Abdomen: Mild gastric and left upper quadrant tenderness without rebound or guarding. RUQ nontender.  Positive bowel sounds all 4 quadrants.  No HSM. Rectal: Deferred. Musculoskeletal:  Symmetrical without gross deformities.  Pulses:  Normal pulses noted. Extremities:  Without clubbing or edema. Neurologic:  Alert and  oriented x4. No focal deficits.  Skin:  Intact without significant lesions or rashes. Psych:  Alert and cooperative. Normal mood and affect.  Intake/Output from previous day: No intake/output data recorded. Intake/Output this shift: No intake/output data recorded.  Lab Results: Recent Labs    08/24/20 2150 08/24/20 2330 08/25/20 0622  WBC 9.9  9.3 7.4  HGB 13.9 13.0 12.5  HCT 41.1 37.9 37.0  PLT 202 221 207   BMET Recent Labs    08/24/20 2150 08/24/20 2330 08/25/20 0622  NA 139 137 138  K 2.6* 3.3* 3.6  CL 113* 105 105  CO2 18* 24 22  GLUCOSE 90 100* 108*  BUN 7 8 8   CREATININE 0.75 0.74 0.89  CALCIUM 6.8* 8.7* 8.5*   LFT Recent Labs    08/24/20 2322 08/24/20 2330 08/25/20 0622  PROT 7.1   < > 6.9  ALBUMIN 3.8   < > 3.6  AST 47*   < > 18  ALT 14   < > 14  ALKPHOS 95   < > 89  BILITOT 0.6   < > 0.7  BILIDIR 0.1  --   --   IBILI 0.5  --   --    < > = values in this interval not displayed.   PT/INR No results for input(s): LABPROT, INR in the last 72 hours. Hepatitis Panel No results for input(s): HEPBSAG, HCVAB, HEPAIGM, HEPBIGM in the last 72 hours.    Studies/Results: DG Chest 2 View  Result Date: 08/24/2020 CLINICAL DATA:  Chest pain EXAM: CHEST - 2 VIEW COMPARISON:  02/01/2020 FINDINGS: The heart size and mediastinal contours are within normal limits. Both lungs are clear. The visualized skeletal structures are unremarkable. IMPRESSION: No active cardiopulmonary disease. Electronically Signed   By: Donavan Foil M.D.   On: 08/24/2020 22:05   DG Abd 1 View  Result Date: 08/25/2020 CLINICAL DATA:  Epigastric pain. Upper GI requested. Patient not NPO for required 6-8 hours. EXAM: ABDOMEN - 1 VIEW COMPARISON:  CT of 12/21/2016 FINDINGS: Two supine views of the abdomen and pelvis. There is food within the stomach, precluding upper GI. No free intraperitoneal air. Nonobstructive bowel gas pattern. No abnormal abdominal calcifications. No appendicolith. Presumed phleboliths in the pelvis. IMPRESSION: The patient was not NPO for the required 6-8 hours and food is seen within the stomach, precluding upper GI. No acute findings. Electronically Signed   By: Abigail Miyamoto M.D.   On: 08/25/2020 16:05   DG UGI W SINGLE CM (  SOL OR THIN BA)  Result Date: 08/26/2020 CLINICAL DATA:  Epigastric pain 3 days with  vomiting. EXAM: UPPER GI SERIES WITH KUB TECHNIQUE: After obtaining a scout radiograph a routine upper GI series was performed using thin and high density barium. FLUOROSCOPY TIME:  Fluoroscopy Time:  2 minutes 0 seconds Radiation Exposure Index (if provided by the fluoroscopic device): Number of Acquired Spot Images: 16 COMPARISON:  KUB 08/25/2020 FINDINGS: Preliminary KUB demonstrates normal bowel gas pattern. 3 mm calcification to the left of the L2-3 disc space not seen yesterday. No renal calculi seen yesterday. No renal calculi on CT abdomen pelvis 12/20/2016 Esophageal mucosa and motility normal. No stricture or mass. Small hiatal hernia with moderate gastroesophageal reflux while drinking water. Normal stomach. No gastric mass or ulceration. Normal dual bulb without scarring or ulcer. IMPRESSION: Moderate gastroesophageal reflux Negative for peptic ulcer disease. 3 mm calcification to the left of L2-3 disc space is new since yesterday. Question symptoms of left renal colic. Electronically Signed   By: Franchot Gallo M.D.   On: 08/26/2020 09:40   ECHOCARDIOGRAM COMPLETE  Result Date: 08/26/2020    ECHOCARDIOGRAM REPORT   Patient Name:   Laurie Bell Date of Exam: 08/26/2020 Medical Rec #:  [id number]          Height:       66.0 in Accession #:    [id number]         Weight:       190.0 lb Date of Birth:  [birth year]         BSA:          1.957 m Patient Age:    6 years           BP:           92/57 mmHg Patient Gender: F                  HR:           65 bpm. Exam Location:  Inpatient Procedure: 2D Echo Indications:    Chest Pain R07.9  History:        Patient has no prior history of Echocardiogram examinations.  Sonographer:    Mikki Santee RDCS (AE) Referring Phys: [phone] RALPH A Hill  1. Left ventricular ejection fraction, by estimation, is 65 to 70%. The left ventricle has normal function. The left ventricle has no regional wall motion abnormalities. Left ventricular diastolic  parameters were normal.  2. Right ventricular systolic function is normal. The right ventricular size is normal.  3. The mitral valve is normal in structure. Trivial mitral valve regurgitation.  4. The aortic valve is normal in structure. Aortic valve regurgitation is not visualized. FINDINGS  Left Ventricle: Left ventricular ejection fraction, by estimation, is 65 to 70%. The left ventricle has normal function. The left ventricle has no regional wall motion abnormalities. The left ventricular internal cavity size was normal in size. There is  no left ventricular hypertrophy. Left ventricular diastolic parameters were normal. Right Ventricle: The right ventricular size is normal. No increase in right ventricular wall thickness. Right ventricular systolic function is normal. Left Atrium: Left atrial size was normal in size. Right Atrium: Right atrial size was normal in size. Pericardium: There is no evidence of pericardial effusion. Mitral Valve: The mitral valve is normal in structure. Trivial mitral valve regurgitation. Tricuspid Valve: The tricuspid valve is normal in structure. Tricuspid valve regurgitation is trivial. Aortic Valve: The aortic valve is normal in  structure. Aortic valve regurgitation is not visualized. Pulmonic Valve: The pulmonic valve was normal in structure. Pulmonic valve regurgitation is not visualized. Aorta: The aortic root and ascending aorta are structurally normal, with no evidence of dilitation. IAS/Shunts: The atrial septum is grossly normal.  LEFT VENTRICLE PLAX 2D LVIDd:         4.41 cm  Diastology LVIDs:         2.71 cm  LV e' medial:    9.44 cm/s LV PW:         0.83 cm  LV E/e' medial:  9.0 LV IVS:        0.82 cm  LV e' lateral:   10.76 cm/s LVOT diam:     2.00 cm  LV E/e' lateral: 7.9 LV SV:         51 LV SV Index:   26 LVOT Area:     3.14 cm  RIGHT VENTRICLE RV S prime:     12.80 cm/s TAPSE (M-mode): 2.7 cm LEFT ATRIUM         Index LA diam:    2.90 cm 1.48 cm/m  AORTIC VALVE  LVOT Vmax:   82.80 cm/s LVOT Vmean:  55.700 cm/s LVOT VTI:    0.163 m  AORTA Ao Root diam: 2.90 cm MITRAL VALVE MV Area (PHT): 4.49 cm    SHUNTS MV Decel Time: 169 msec    Systemic VTI:  0.16 m MV E velocity: 85.40 cm/s  Systemic Diam: 2.00 cm MV A velocity: 51.90 cm/s MV E/A ratio:  1.65 Mertie Moores MD Electronically signed by Mertie Moores MD Signature Date/Time: 08/26/2020/1:49:22 PM    Final     IMPRESSION/PLAN:  76.  40 year old female with chest pressure and left upper quadrant abdominal pain.  Chest pressure unlikely cardiac with negative troponin, normal EKG and ECHO. Upper GI series showed evidence of moderate acid reflux and a small hiatal hernia. -Discussed scheduling an EGD rule out GERD, PUD, upper GI malignancy but she prefers to schedule an EGD as an outpatient -Continue Pantoprazole 40 mg 1 p.o. daily -Reduce Carafate to twice daily as she has constipation, no bowel movement in 3 to 4 days. -Patient advised not to eat within 3 to 4 hours before going to bed -Avoid NSAIDs -Consider CTAP if abdominal pain worsens -Further recommendations per Dr. Adline Peals Dorathy Daft  08/26/2020, 4:24 PM

## 2020-08-26 NOTE — Progress Notes (Signed)
  Echocardiogram 2D Echocardiogram has been performed.  Jennette Dubin 08/26/2020, 10:35 AM

## 2020-08-26 NOTE — Progress Notes (Signed)
PROGRESS NOTE    Laurie Bell  FTD:[id number] DOB: 09-03-1980 DOA: 08/24/2020 PCP: Patient, No Pcp Per   Brief Narrative: Laurie Bell is a 40 y.o. female with a history of fibroids. She presented secondary to worsening substernal chest pain. Pain appears to be related to reflux. Cardiac workup unremarkable.   Assessment & Plan:   Principal Problem:   Hypokalemia Active Problems:   Chest pain   Nausea and vomiting   Hypocalcemia   Chest pain Seems atypical. Unknown etiology. Cardiac workup so far is negative. EKG is non-ischemic. Troponin negative. No associated dyspnea or tachycardia to suggest PE. Patient with associated left shoulder/arm radiation. Also appears to have a combination of chest-wall pain. Upper GI imaging significant for moderate GERD in addition to a small hiatal hernia. Transthoracic Echocardiogram without structural deficits. -GI consult for recommendations since she is still having significant chest pain -Continue Protonix, add Carafate -Morphine IV prn -Voltaren gel for chest wall pain  Nausea/vomiting Unknown etiology. Appears to have resolved.  Hypokalemia Likely contributed to by nausea/vomiting. Supplementation given with resolution.  Hypocalcemia Transient. Slightly low but minimal. Outpatient follow-up.   DVT prophylaxis: Lovenox Code Status:   Code Status: Full Code Family Communication: None at bedside Disposition Plan: Discharge likely in 24 hours pending Transthoracic Echocardiogram / upper GI   Consultants:   None  Procedures:   TRANSTHORACIC ECHOCARDIOGRAM (08/26/2020)  Antimicrobials:  None    Subjective: Continues chest pain. Substernal/epigastric. Slightly improved but still present.  Objective: Vitals:   08/25/20 1639 08/25/20 1944 08/26/20 0609 08/26/20 1409  BP: 104/83 110/81 (!) 92/57 118/82  Pulse: 81 79 65 79  Resp: 16   17  Temp: 98.2 F (36.8 C) 98.6 F (37 C) (!) 97.5 F (36.4 C) 98 F  (36.7 C)  TempSrc: Oral Oral Oral Oral  SpO2: 100% 98% 98% 99%  Weight:      Height:       No intake or output data in the 24 hours ending 08/26/20 1657 Filed Weights   08/24/20 2124  Weight: 86.2 kg    Examination:  General exam: Appears calm and comfortable Respiratory system: Clear to auscultation. Respiratory effort normal. Cardiovascular system: S1 & S2 heard, RRR. No murmurs, rubs, gallops or clicks. Gastrointestinal system: Abdomen is nondistended, soft and with epigastric tenderness. No organomegaly or masses felt. Normal bowel sounds heard. Central nervous system: Alert and oriented. No focal neurological deficits. Musculoskeletal: No edema. No calf tenderness Skin: No cyanosis. No rashes Psychiatry: Judgement and insight appear normal. Mood & affect appropriate.     Data Reviewed: I have personally reviewed following labs and imaging studies  CBC Lab Results  Component Value Date   WBC 7.4 08/25/2020   RBC 4.25 08/25/2020   HGB 12.5 08/25/2020   HCT 37.0 08/25/2020   MCV 87.1 08/25/2020   MCH 29.4 08/25/2020   PLT 207 08/25/2020   MCHC 33.8 08/25/2020   RDW 12.6 08/25/2020   LYMPHSABS 2.9 01/09/2018   MONOABS 0.6 01/09/2018   EOSABS 0.2 01/09/2018   BASOSABS 0.0 12/03/7626     Last metabolic panel Lab Results  Component Value Date   NA 138 08/25/2020   K 3.6 08/25/2020   CL 105 08/25/2020   CO2 22 08/25/2020   BUN 8 08/25/2020   CREATININE 0.89 08/25/2020   GLUCOSE 108 (H) 08/25/2020   GFRNONAA >60 08/25/2020   GFRAA >60 02/01/2020   CALCIUM 8.5 (L) 08/25/2020   PHOS 2.8 12/24/2007   PROT 6.9  08/25/2020   ALBUMIN 3.6 08/25/2020   BILITOT 0.7 08/25/2020   ALKPHOS 89 08/25/2020   AST 18 08/25/2020   ALT 14 08/25/2020   ANIONGAP 11 08/25/2020    CBG (last 3)  No results for input(s): GLUCAP in the last 72 hours.   GFR: Estimated Creatinine Clearance: 93 mL/min (by C-G formula based on SCr of 0.89 mg/dL).  Coagulation Profile: No  results for input(s): INR, PROTIME in the last 168 hours.  Recent Results (from the past 240 hour(s))  SARS CORONAVIRUS 2 (TAT 6-24 HRS) Nasopharyngeal Nasopharyngeal Swab     Status: None   Collection Time: 08/24/20 11:15 PM   Specimen: Nasopharyngeal Swab  Result Value Ref Range Status   SARS Coronavirus 2 NEGATIVE NEGATIVE Final    Comment: (NOTE) SARS-CoV-2 target nucleic acids are NOT DETECTED.  The SARS-CoV-2 RNA is generally detectable in upper and lower respiratory specimens during the acute phase of infection. Negative results do not preclude SARS-CoV-2 infection, do not rule out co-infections with other pathogens, and should not be used as the sole basis for treatment or other patient management decisions. Negative results must be combined with clinical observations, patient history, and epidemiological information. The expected result is Negative.  Fact Sheet for Patients: SugarRoll.be  Fact Sheet for Healthcare Providers: https://www.woods-mathews.com/  This test is not yet approved or cleared by the [place] FDA and  has been authorized for detection and/or diagnosis of SARS-CoV-2 by FDA under an Emergency Use Authorization (EUA). This EUA will remain  in effect (meaning this test can be used) for the duration of the COVID-19 declaration under Se ction 564(b)(1) of the Act, 21 U.S.C. section 360bbb-3(b)(1), unless the authorization is terminated or revoked sooner.  Performed at [facility], [place] [address] [place], [place] [address]         Radiology Studies: DG Chest 2 View  Result Date: 08/24/2020 CLINICAL DATA:  Chest pain EXAM: CHEST - 2 VIEW COMPARISON:  02/01/2020 FINDINGS: The heart size and mediastinal contours are within normal limits. Both lungs are clear. The visualized skeletal structures are unremarkable. IMPRESSION: No active cardiopulmonary disease. Electronically Signed   By: Donavan Foil  M.D.   On: 08/24/2020 22:05   DG Abd 1 View  Result Date: 08/25/2020 CLINICAL DATA:  Epigastric pain. Upper GI requested. Patient not NPO for required 6-8 hours. EXAM: ABDOMEN - 1 VIEW COMPARISON:  CT of 12/21/2016 FINDINGS: Two supine views of the abdomen and pelvis. There is food within the stomach, precluding upper GI. No free intraperitoneal air. Nonobstructive bowel gas pattern. No abnormal abdominal calcifications. No appendicolith. Presumed phleboliths in the pelvis. IMPRESSION: The patient was not NPO for the required 6-8 hours and food is seen within the stomach, precluding upper GI. No acute findings. Electronically Signed   By: Abigail Miyamoto M.D.   On: 08/25/2020 16:05   DG UGI W SINGLE CM (SOL OR THIN BA)  Result Date: 08/26/2020 CLINICAL DATA:  Epigastric pain 3 days with vomiting. EXAM: UPPER GI SERIES WITH KUB TECHNIQUE: After obtaining a scout radiograph a routine upper GI series was performed using thin and high density barium. FLUOROSCOPY TIME:  Fluoroscopy Time:  2 minutes 0 seconds Radiation Exposure Index (if provided by the fluoroscopic device): Number of Acquired Spot Images: 16 COMPARISON:  KUB 08/25/2020 FINDINGS: Preliminary KUB demonstrates normal bowel gas pattern. 3 mm calcification to the left of the L2-3 disc space not seen yesterday. No renal calculi seen yesterday. No renal calculi on  CT abdomen pelvis 12/20/2016 Esophageal mucosa and motility normal. No stricture or mass. Small hiatal hernia with moderate gastroesophageal reflux while drinking water. Normal stomach. No gastric mass or ulceration. Normal dual bulb without scarring or ulcer. IMPRESSION: Moderate gastroesophageal reflux Negative for peptic ulcer disease. 3 mm calcification to the left of L2-3 disc space is new since yesterday. Question symptoms of left renal colic. Electronically Signed   By: Franchot Gallo M.D.   On: 08/26/2020 09:40   ECHOCARDIOGRAM COMPLETE  Result Date: 08/26/2020    ECHOCARDIOGRAM  REPORT   Patient Name:   CHALONDA SCHLATTER Date of Exam: 08/26/2020 Medical Rec #:  [id number]          Height:       66.0 in Accession #:    [id number]         Weight:       190.0 lb Date of Birth:  [birth year]         BSA:          1.957 m Patient Age:    69 years           BP:           92/57 mmHg Patient Gender: F                  HR:           65 bpm. Exam Location:  Inpatient Procedure: 2D Echo Indications:    Chest Pain R07.9  History:        Patient has no prior history of Echocardiogram examinations.  Sonographer:    Mikki Santee RDCS (AE) Referring Phys: [phone] Fareedah Mahler A Shepherd  1. Left ventricular ejection fraction, by estimation, is 65 to 70%. The left ventricle has normal function. The left ventricle has no regional wall motion abnormalities. Left ventricular diastolic parameters were normal.  2. Right ventricular systolic function is normal. The right ventricular size is normal.  3. The mitral valve is normal in structure. Trivial mitral valve regurgitation.  4. The aortic valve is normal in structure. Aortic valve regurgitation is not visualized. FINDINGS  Left Ventricle: Left ventricular ejection fraction, by estimation, is 65 to 70%. The left ventricle has normal function. The left ventricle has no regional wall motion abnormalities. The left ventricular internal cavity size was normal in size. There is  no left ventricular hypertrophy. Left ventricular diastolic parameters were normal. Right Ventricle: The right ventricular size is normal. No increase in right ventricular wall thickness. Right ventricular systolic function is normal. Left Atrium: Left atrial size was normal in size. Right Atrium: Right atrial size was normal in size. Pericardium: There is no evidence of pericardial effusion. Mitral Valve: The mitral valve is normal in structure. Trivial mitral valve regurgitation. Tricuspid Valve: The tricuspid valve is normal in structure. Tricuspid valve regurgitation is trivial.  Aortic Valve: The aortic valve is normal in structure. Aortic valve regurgitation is not visualized. Pulmonic Valve: The pulmonic valve was normal in structure. Pulmonic valve regurgitation is not visualized. Aorta: The aortic root and ascending aorta are structurally normal, with no evidence of dilitation. IAS/Shunts: The atrial septum is grossly normal.  LEFT VENTRICLE PLAX 2D LVIDd:         4.41 cm  Diastology LVIDs:         2.71 cm  LV e' medial:    9.44 cm/s LV PW:         0.83 cm  LV E/e' medial:  9.0 LV IVS:  0.82 cm  LV e' lateral:   10.76 cm/s LVOT diam:     2.00 cm  LV E/e' lateral: 7.9 LV SV:         51 LV SV Index:   26 LVOT Area:     3.14 cm  RIGHT VENTRICLE RV S prime:     12.80 cm/s TAPSE (M-mode): 2.7 cm LEFT ATRIUM         Index LA diam:    2.90 cm 1.48 cm/m  AORTIC VALVE LVOT Vmax:   82.80 cm/s LVOT Vmean:  55.700 cm/s LVOT VTI:    0.163 m  AORTA Ao Root diam: 2.90 cm MITRAL VALVE MV Area (PHT): 4.49 cm    SHUNTS MV Decel Time: 169 msec    Systemic VTI:  0.16 m MV E velocity: 85.40 cm/s  Systemic Diam: 2.00 cm MV A velocity: 51.90 cm/s MV E/A ratio:  1.65 Mertie Moores MD Electronically signed by Mertie Moores MD Signature Date/Time: 08/26/2020/1:49:22 PM    Final         Scheduled Meds: . bisacodyl  10 mg Rectal Once  . diclofenac Sodium  2 g Topical QID  . enoxaparin (LOVENOX) injection  40 mg Subcutaneous Q24H  . melatonin  5 mg Oral QHS  . pantoprazole  40 mg Oral Daily  . polyethylene glycol  17 g Oral BID  . potassium chloride  40 mEq Oral Once  . sodium chloride flush  3 mL Intravenous Q12H  . sucralfate  1 g Oral TID WC & HS   Continuous Infusions:   LOS: 1 day     Cordelia Poche, MD [facility] 08/26/2020, 4:57 PM  If 7PM-7AM, please contact night-coverage [email or web address]

## 2020-08-26 NOTE — TOC Transition Note (Signed)
Transition of Care [facility]) - CM/SW Discharge Note   Patient Details  Name: Laurie Bell MRN: [id number] Date of Birth: [birth year]  [facility] CM/SW Contact:  Lynnell Catalan, RN Phone Number: 08/26/2020, 12:26 PM   Clinical Narrative:     [place] info placed on AVS for pt to establish PCP

## 2020-08-27 ENCOUNTER — Encounter: Payer: Self-pay | Admitting: Physician Assistant

## 2020-08-27 MED ORDER — PANTOPRAZOLE SODIUM 40 MG PO TBEC: 40.0000 mg | DELAYED_RELEASE_TABLET | Freq: Every day | ORAL | 0 refills | Status: DC

## 2020-08-27 MED ORDER — PANTOPRAZOLE SODIUM 40 MG PO TBEC: 40.0000 mg | DELAYED_RELEASE_TABLET | Freq: Two times a day (BID) | ORAL | 0 refills | Status: DC

## 2020-08-27 MED ORDER — HYDROXYZINE HCL 10 MG PO TABS: 10.0000 mg | ORAL_TABLET | Freq: Three times a day (TID) | ORAL | 0 refills | Status: DC | PRN

## 2020-08-27 MED ORDER — DICLOFENAC SODIUM 1 % EX GEL: 2.0000 g | Freq: Four times a day (QID) | CUTANEOUS | 0 refills | Status: DC

## 2020-08-27 MED ORDER — SUCRALFATE 1 GM/10ML PO SUSP: 1.0000 g | Freq: Two times a day (BID) | ORAL | 0 refills | Status: DC | PRN

## 2020-08-27 MED ORDER — POLYETHYLENE GLYCOL 3350 17 G PO PACK: 17.0000 g | PACK | Freq: Two times a day (BID) | ORAL | 0 refills | Status: DC

## 2020-09-13 ENCOUNTER — Ambulatory Visit: Payer: Self-pay | Admitting: Physician Assistant

## 2020-10-06 ENCOUNTER — Other Ambulatory Visit: Payer: Self-pay

## 2020-10-06 ENCOUNTER — Ambulatory Visit: Payer: Self-pay | Attending: Physician Assistant | Admitting: Physician Assistant

## 2020-10-06 ENCOUNTER — Encounter: Payer: Self-pay | Admitting: Physician Assistant

## 2020-10-06 DIAGNOSIS — F419 Anxiety disorder, unspecified: Secondary | ICD-10-CM

## 2020-10-06 DIAGNOSIS — R079 Chest pain, unspecified: Secondary | ICD-10-CM

## 2020-10-06 DIAGNOSIS — K219 Gastro-esophageal reflux disease without esophagitis: Secondary | ICD-10-CM

## 2020-10-06 DIAGNOSIS — Z09 Encounter for follow-up examination after completed treatment for conditions other than malignant neoplasm: Secondary | ICD-10-CM

## 2020-10-06 MED ORDER — PROMETHAZINE HCL 12.5 MG PO TABS
12.5000 mg | ORAL_TABLET | Freq: Three times a day (TID) | ORAL | 0 refills | Status: DC | PRN
  Filled 2020-10-06: qty 20, 7d supply, fill #0

## 2020-10-06 MED ORDER — OMEPRAZOLE 20 MG PO CPDR
20.0000 mg | DELAYED_RELEASE_CAPSULE | Freq: Every day | ORAL | 3 refills | Status: DC
  Filled 2020-10-06: qty 30, 30d supply, fill #0
  Filled 2020-11-23: qty 30, 30d supply, fill #1
  Filled 2021-02-17: qty 30, 30d supply, fill #2

## 2020-10-06 MED ORDER — BUSPIRONE HCL 15 MG PO TABS
15.0000 mg | ORAL_TABLET | Freq: Two times a day (BID) | ORAL | 3 refills | Status: DC
Start: 2020-10-06 — End: 2021-02-22
  Filled 2020-10-06: qty 60, 30d supply, fill #0
  Filled 2021-02-17: qty 60, 30d supply, fill #1

## 2020-10-06 MED ORDER — HYDROXYZINE HCL 10 MG PO TABS
10.0000 mg | ORAL_TABLET | Freq: Three times a day (TID) | ORAL | 0 refills | Status: DC | PRN
  Filled 2020-10-06: qty 30, 10d supply, fill #0

## 2020-10-06 NOTE — Progress Notes (Signed)
Patient ID: Laurie Bell, female   DOB: 11-03-1980, 40 y.o.   MRN: [id number]   Virtual Visit via Telephone Note  I connected with Laurie Bell on 10/06/20 at  2:30 PM EDT by telephone and verified that I am speaking with the correct person using two identifiers.  Location: Patient: home Provider: [facility]   I discussed the limitations, risks, security and privacy concerns of performing an evaluation and management service by telephone and the availability of in person appointments. I also discussed with the patient that there may be a patient responsible charge related to this service. The patient expressed understanding and agreed to proceed.   History of Present Illness: After hospitalization 3/15-3/18/2022.  She had a GI appt scheduled bu missed the appt and needs to reschedule.  She says she has the information for that.  Still having acid reflux and could not afford the medication she was prescribed.  Occasionally she has nausea and vomiting.  Labs were back to normal at hospital d/c.    Also c/o anxiety since having covid a while ago.  Hydroxyzine helps some but she would like to try something she could take daily and not just for breakthrough anxiety.  No SI/HI.     From discharge summary Hospital course:  GERD Seems atypical. Unknown etiology. Cardiac workup so far is negative. EKG is non-ischemic. Troponin negative. No associated dyspnea or tachycardia to suggest PE. Patient with associated left shoulder/arm radiation. Also appears to have a combination of chest-wall pain.Upper GI imaging significant for moderate GERD in addition to a small hiatal hernia. Transthoracic Echocardiogram without structural deficits. GI consulted for continued epigastric pain and recommended EGD but patient preferred outpatient EGD. Patient discharged on Protonix, Carafate.  Costochondritis Continue Voltaren gel on discharge  Nausea/vomiting Unknown etiology. Appears to have  resolved.  Hypokalemia Likely contributed to by nausea/vomiting. Supplementation given with resolution.  Hypocalcemia Transient. Slightly low but minimal. Outpatient follow-up.   Discharge Diagnoses:  Principal Problem:   Hypokalemia Active Problems:   Chest pain   Nausea and vomiting   Hypocalcemia   Gastroesophageal reflux disease     Observations/Objective: NAD.  A&Ox3  Assessment and Plan: 1. Chest pain, unspecified type Cardiac R/O in hospital and has since resolved - omeprazole (PRILOSEC) 20 MG capsule; Take 1 capsule (20 mg total) by mouth daily.  Dispense: 30 capsule; Refill: 3  2. Gastroesophageal reflux disease, unspecified whether esophagitis present Make appt with GI-patient verbalizes understanding and has information - omeprazole (PRILOSEC) 20 MG capsule; Take 1 capsule (20 mg total) by mouth daily.  Dispense: 30 capsule; Refill: 3 - promethazine (PHENERGAN) 12.5 MG tablet; Take 1 tablet (12.5 mg total) by mouth every 8 (eight) hours as needed for nausea or vomiting.  Dispense: 20 tablet; Refill: 0  3. Anxiety - hydrOXYzine (ATARAX/VISTARIL) 10 MG tablet; Take 1 tablet (10 mg total) by mouth 3 (three) times daily as needed for anxiety.  Dispense: 30 tablet; Refill: 0 - busPIRone (BUSPAR) 15 MG tablet; Take 1 tablet (15 mg total) by mouth 2 (two) times daily. For anxiety  Dispense: 60 tablet; Refill: 3  4. Hospital discharge follow-up   Follow Up Instructions: Assign PCP in 2-3 months   I discussed the assessment and treatment plan with the patient. The patient was provided an opportunity to ask questions and all were answered. The patient agreed with the plan and demonstrated an understanding of the instructions.   The patient was advised to call back or seek an in-person evaluation  if the symptoms worsen or if the condition fails to improve as anticipated.  I provided 17 minutes of non-face-to-face time during this encounter.   Freeman Caldron,  PA-C

## 2020-10-07 ENCOUNTER — Other Ambulatory Visit: Payer: Self-pay

## 2020-10-07 ENCOUNTER — Telehealth: Payer: Self-pay

## 2020-10-07 NOTE — Telephone Encounter (Signed)
Copied from [place] [phone]. Topic: General - Other >> Oct 07, 2020  8:48 AM Keene Breath wrote: Reason for CRM: Patient would like the nurse or doctor to call in or suggest medication for her allergies.  She stated that at her appt. Yesterday, she forgot to tell the doctor about her allergy and today it is really bothering her more. \   Please advise and call patient to discuss at [phone]

## 2020-10-08 ENCOUNTER — Other Ambulatory Visit: Payer: Self-pay

## 2020-10-08 NOTE — Telephone Encounter (Signed)
Pt calling in again regarding this message and is requesting to have PCP give her a call with an update. Please advise.

## 2020-10-08 NOTE — Telephone Encounter (Signed)
Pt was called and informed to obtain OTC Zyrtec or Claritin.

## 2020-10-22 ENCOUNTER — Ambulatory Visit: Payer: Self-pay | Admitting: Physician Assistant

## 2020-11-23 ENCOUNTER — Other Ambulatory Visit: Payer: Self-pay

## 2021-02-17 ENCOUNTER — Other Ambulatory Visit: Payer: Self-pay

## 2021-02-21 ENCOUNTER — Other Ambulatory Visit: Payer: Self-pay

## 2021-02-21 ENCOUNTER — Other Ambulatory Visit: Payer: Self-pay | Admitting: Physician Assistant

## 2021-02-21 DIAGNOSIS — F419 Anxiety disorder, unspecified: Secondary | ICD-10-CM

## 2021-02-21 NOTE — Telephone Encounter (Signed)
Requested medications are due for refill today.  yes  Requested medications are on the active medications list.  yes  Last refill. 10/06/2020  Future visit scheduled.   With Dr. Redmond Pulling at [place].  Notes to clinic.  No PCP per chart.

## 2021-02-22 ENCOUNTER — Other Ambulatory Visit: Payer: Self-pay

## 2021-02-22 ENCOUNTER — Ambulatory Visit: Payer: Self-pay | Admitting: Physician Assistant

## 2021-02-22 VITALS — BP 126/93 | HR 82 | Temp 98.2°F | Resp 18 | Ht 67.0 in | Wt 178.0 lb

## 2021-02-22 DIAGNOSIS — K219 Gastro-esophageal reflux disease without esophagitis: Secondary | ICD-10-CM

## 2021-02-22 DIAGNOSIS — Z6827 Body mass index (BMI) 27.0-27.9, adult: Secondary | ICD-10-CM

## 2021-02-22 DIAGNOSIS — E876 Hypokalemia: Secondary | ICD-10-CM

## 2021-02-22 DIAGNOSIS — F419 Anxiety disorder, unspecified: Secondary | ICD-10-CM

## 2021-02-22 MED ORDER — SUCRALFATE 1 GM/10ML PO SUSP
1.0000 g | Freq: Three times a day (TID) | ORAL | 0 refills | Status: DC
Start: 2021-02-22 — End: 2021-04-20
  Filled 2021-02-22: qty 420, 11d supply, fill #0

## 2021-02-22 MED ORDER — HYDROXYZINE HCL 10 MG PO TABS
10.0000 mg | ORAL_TABLET | Freq: Three times a day (TID) | ORAL | 0 refills | Status: DC | PRN
  Filled 2021-02-22: qty 60, 20d supply, fill #0

## 2021-02-22 MED ORDER — PANTOPRAZOLE SODIUM 40 MG PO TBEC
40.0000 mg | DELAYED_RELEASE_TABLET | Freq: Every day | ORAL | 3 refills | Status: DC
  Filled 2021-02-22: qty 30, 30d supply, fill #0
  Filled 2021-03-24: qty 30, 30d supply, fill #1
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
## 2021-02-22 NOTE — Patient Instructions (Signed)
You will take Protonix on a daily basis and use Carafate before each meal.  I refilled the hydroxyzine as well.  I started a referral for you to be seen by our clinic counselor, please expect a call from her  We will call you with your lab results  Kennieth Rad, PA-C Physician Assistant [place] [email or web address]   Managing Anxiety, Adult After being diagnosed with an anxiety disorder, you may be relieved to know why you have felt or behaved a certain way. You may also feel overwhelmed about the treatment ahead and what it will mean for your life. With care and support, you can manage this condition and recover from it. How to manage lifestyle changes Managing stress and anxiety Stress is your body's reaction to life changes and events, both good and bad. Most stress will last just a few hours, but stress can be ongoing and can lead to more than just stress. Although stress can play a major role in anxiety, it is not the same as anxiety. Stress is usually caused by something external, such as a deadline, test, or competition. Stress normally passes after the triggering event has ended.  Anxiety is caused by something internal, such as imagining a terrible outcome or worrying that something will go wrong that will devastate you. Anxiety often does not go away even after the triggering event is over, and it can become long-term (chronic) worry. It is important to understand the differences between stress and anxiety and to manage your stress effectively so that it does not lead to an anxious response. Talk with your health care provider or a counselor to learn more about reducing anxiety and stress. He or she may suggest tension reduction techniques, such as: Music therapy. This can include creating or listening to music that you enjoy and that inspires you. Mindfulness-based meditation. This involves being aware of your normal breaths  while not trying to control your breathing. It can be done while sitting or walking. Centering prayer. This involves focusing on a word, phrase, or sacred image that means something to you and brings you peace. Deep breathing. To do this, expand your stomach and inhale slowly through your nose. Hold your breath for 3-5 seconds. Then exhale slowly, letting your stomach muscles relax. Self-talk. This involves identifying thought patterns that lead to anxiety reactions and changing those patterns. Muscle relaxation. This involves tensing muscles and then relaxing them. Choose a tension reduction technique that suits your lifestyle and personality. These techniques take time and practice. Set aside 5-15 minutes a day to do them. Therapists can offer counseling and training in these techniques. The training to help with anxiety may be covered by some insurance plans. Other things you can do to manage stress and anxiety include: Keeping a stress/anxiety diary. This can help you learn what triggers your reaction and then learn ways to manage your response. Thinking about how you react to certain situations. You may not be able to control everything, but you can control your response. Making time for activities that help you relax and not feeling guilty about spending your time in this way. Visual imagery and yoga can help you stay calm and relax.  Medicines Medicines can help ease symptoms. Medicines for anxiety include: Anti-anxiety drugs. Antidepressants. Medicines are often used as a primary treatment for anxiety disorder. Medicines will be prescribed by a health care provider. When used together, medicines, psychotherapy, and tension reduction techniques may be the most effective treatment. Relationships Relationships  can play a big part in helping you recover. Try to spend more time connecting with trusted friends and family members. Consider going to couples counseling, taking family education  classes, or going to family therapy. Therapy can help you and others better understand your condition. How to recognize changes in your anxiety Everyone responds differently to treatment for anxiety. Recovery from anxiety happens when symptoms decrease and stop interfering with your daily activities at home or work. This may mean that you will start to: Have better concentration and focus. Worry will interfere less in your daily thinking. Sleep better. Be less irritable. Have more energy. Have improved memory. It is important to recognize when your condition is getting worse. Contact your health care provider if your symptoms interfere with home or work and you feel like your condition is not improving. Follow these instructions at home: Activity Exercise. Most adults should do the following: Exercise for at least 150 minutes each week. The exercise should increase your heart rate and make you sweat (moderate-intensity exercise). Strengthening exercises at least twice a week. Get the right amount and quality of sleep. Most adults need 7-9 hours of sleep each night. Lifestyle  Eat a healthy diet that includes plenty of vegetables, fruits, whole grains, low-fat dairy products, and lean protein. Do not eat a lot of foods that are high in solid fats, added sugars, or salt. Make choices that simplify your life. Do not use any products that contain nicotine or tobacco, such as cigarettes, e-cigarettes, and chewing tobacco. If you need help quitting, ask your health care provider. Avoid caffeine, alcohol, and certain over-the-counter cold medicines. These may make you feel worse. Ask your pharmacist which medicines to avoid. General instructions Take over-the-counter and prescription medicines only as told by your health care provider. Keep all follow-up visits as told by your health care provider. This is important. Where to find support You can get help and support from these sources: Self-help  groups. Online and [employer]. A trusted spiritual leader. Couples counseling. Family education classes. Family therapy. Where to find more information You may find that joining a support group helps you deal with your anxiety. The following sources can help you locate counselors or support groups near you: [place]: www.mentalhealthamerica.net Anxiety and Depression Association of [place] (ADAA): https://www.clark.net/ National Alliance on Mental Illness (NAMI): www.nami.org Contact a health care provider if you: Have a hard time staying focused or finishing daily tasks. Spend many hours a day feeling worried about everyday life. Become exhausted by worry. Start to have headaches, feel tense, or have nausea. Urinate more than normal. Have diarrhea. Get help right away if you have: A racing heart and shortness of breath. Thoughts of hurting yourself or others. If you ever feel like you may hurt yourself or others, or have thoughts about taking your own life, get help right away. You can go to your nearest emergency department or call: Your local emergency services (911 in the [place] A suicide crisis helpline, such as the [facility] at 819 119 4915. This is open 24 hours a day. Summary Taking steps to learn and use tension reduction techniques can help calm you and help prevent triggering an anxiety reaction. When used together, medicines, psychotherapy, and tension reduction techniques may be the most effective treatment. Family, friends, and partners can play a big part in helping you recover from an anxiety disorder. This information is not intended to replace advice given to you by your health care provider. Make  sure you discuss any questions you have with your health care provider. Document Revised: 10/29/2018 Document Reviewed: 10/29/2018 Elsevier Patient Education  [place].  Gastroesophageal Reflux Disease,  Adult Gastroesophageal reflux (GER) happens when acid from the stomach flows up into the tube that connects the mouth and the stomach (esophagus). Normally, food travels down the esophagus and stays in the stomach to be digested. However, when a person has GER, food and stomach acid sometimes move back up into the esophagus. If this becomes a more serious problem, the person may be diagnosed with a disease called gastroesophageal reflux disease (GERD). GERD occurs when the reflux: Happens often. Causes frequent or severe symptoms. Causes problems such as damage to the esophagus. When stomach acid comes in contact with the esophagus, the acid may cause inflammation in the esophagus. Over time, GERD may create small holes (ulcers) in the lining of the esophagus. What are the causes? This condition is caused by a problem with the muscle between the esophagus and the stomach (lower esophageal sphincter, or LES). Normally, the LES muscle closes after food passes through the esophagus to the stomach. When the LES is weakened or abnormal, it does not close properly, and that allows food and stomach acid to go back up into the esophagus. The LES can be weakened by certain dietary substances, medicines, and medical conditions, including: Tobacco use. Pregnancy. Having a hiatal hernia. Alcohol use. Certain foods and beverages, such as coffee, chocolate, onions, and peppermint. What increases the risk? You are more likely to develop this condition if you: Have an increased body weight. Have a connective tissue disorder. Take NSAIDs, such as ibuprofen. What are the signs or symptoms? Symptoms of this condition include: Heartburn. Difficult or painful swallowing and the feeling of having a lump in the throat. A bitter taste in the mouth. Bad breath and having a large amount of saliva. Having an upset or bloated stomach and belching. Chest pain. Different conditions can cause chest pain. Make sure you  see your health care provider if you experience chest pain. Shortness of breath or wheezing. Ongoing (chronic) cough or a nighttime cough. Wearing away of tooth enamel. Weight loss. How is this diagnosed? This condition may be diagnosed based on a medical history and a physical exam. To determine if you have mild or severe GERD, your health care provider may also monitor how you respond to treatment. You may also have tests, including: A test to examine your stomach and esophagus with a small camera (endoscopy). A test that measures the acidity level in your esophagus. A test that measures how much pressure is on your esophagus. A barium swallow or modified barium swallow test to show the shape, size, and functioning of your esophagus. How is this treated? Treatment for this condition may vary depending on how severe your symptoms are. Your health care provider may recommend: Changes to your diet. Medicine. Surgery. The goal of treatment is to help relieve your symptoms and to prevent complications. Follow these instructions at home: Eating and drinking  Follow a diet as recommended by your health care provider. This may involve avoiding foods and drinks such as: Coffee and tea, with or without caffeine. Drinks that contain alcohol. Energy drinks and sports drinks. Carbonated drinks or sodas. Chocolate and cocoa. Peppermint and mint flavorings. Garlic and onions. Horseradish. Spicy and acidic foods, including peppers, chili powder, curry powder, vinegar, hot sauces, and barbecue sauce. Citrus fruit juices and citrus fruits, such as oranges, lemons, and limes.  Tomato-based foods, such as red sauce, chili, salsa, and pizza with red sauce. Fried and fatty foods, such as donuts, french fries, potato chips, and high-fat dressings. High-fat meats, such as hot dogs and fatty cuts of red and white meats, such as rib eye steak, sausage, ham, and bacon. High-fat dairy items, such as whole  milk, butter, and cream cheese. Eat small, frequent meals instead of large meals. Avoid drinking large amounts of liquid with your meals. Avoid eating meals during the 2-3 hours before bedtime. Avoid lying down right after you eat. Do not exercise right after you eat. Lifestyle  Do not use any products that contain nicotine or tobacco. These products include cigarettes, chewing tobacco, and vaping devices, such as e-cigarettes. If you need help quitting, ask your health care provider. Try to reduce your stress by using methods such as yoga or meditation. If you need help reducing stress, ask your health care provider. If you are overweight, reduce your weight to an amount that is healthy for you. Ask your health care provider for guidance about a safe weight loss goal. General instructions Pay attention to any changes in your symptoms. Take over-the-counter and prescription medicines only as told by your health care provider. Do not take aspirin, ibuprofen, or other NSAIDs unless your health care provider told you to take these medicines. Wear loose-fitting clothing. Do not wear anything tight around your waist that causes pressure on your abdomen. Raise (elevate) the head of your bed about 6 inches (15 cm). You can use a wedge to do this. Avoid bending over if this makes your symptoms worse. Keep all follow-up visits. This is important. Contact a health care provider if: You have: New symptoms. Unexplained weight loss. Difficulty swallowing or it hurts to swallow. Wheezing or a persistent cough. A hoarse voice. Your symptoms do not improve with treatment. Get help right away if: You have sudden pain in your arms, neck, jaw, teeth, or back. You suddenly feel sweaty, dizzy, or light-headed. You have chest pain or shortness of breath. You vomit and the vomit is green, yellow, or black, or it looks like blood or coffee grounds. You faint. You have stool that is red, bloody, or  black. You cannot swallow, drink, or eat. These symptoms may represent a serious problem that is an emergency. Do not wait to see if the symptoms will go away. Get medical help right away. Call your local emergency services (911 in the [place] Do not drive yourself to the hospital. Summary Gastroesophageal reflux happens when acid from the stomach flows up into the esophagus. GERD is a disease in which the reflux happens often, causes frequent or severe symptoms, or causes problems such as damage to the esophagus. Treatment for this condition may vary depending on how severe your symptoms are. Your health care provider may recommend diet and lifestyle changes, medicine, or surgery. Contact a health care provider if you have new or worsening symptoms. Take over-the-counter and prescription medicines only as told by your health care provider. Do not take aspirin, ibuprofen, or other NSAIDs unless your health care provider told you to do so. Keep all follow-up visits as told by your health care provider. This is important. This information is not intended to replace advice given to you by your health care provider. Make sure you discuss any questions you have with your health care provider. Document Revised: 12/08/2019 Document Reviewed: 12/08/2019 Elsevier Patient Education  [place].

## 2021-02-22 NOTE — Progress Notes (Signed)
Patient reports vomiting/ force vomiting due to acid. Patient took medication today Patient did not take Carafate due to cost. Patient completed Hydroxyzine and shares Buspar made her sick even with the phenergan.

## 2021-02-23 ENCOUNTER — Other Ambulatory Visit: Payer: Self-pay

## 2021-02-24 ENCOUNTER — Telehealth: Payer: Self-pay | Admitting: *Deleted

## 2021-02-24 LAB — COMP. METABOLIC PANEL (12)
AST: 13 IU/L (ref 0–40)
Albumin/Globulin Ratio: 1.5 (ref 1.2–2.2)
Albumin: 4.4 g/dL (ref 3.8–4.8)
Alkaline Phosphatase: 109 IU/L (ref 44–121)
BUN/Creatinine Ratio: 9 (ref 9–23)
BUN: 7 mg/dL (ref 6–24)
Bilirubin Total: 0.4 mg/dL (ref 0.0–1.2)
Calcium: 9.3 mg/dL (ref 8.7–10.2)
Chloride: 100 mmol/L (ref 96–106)
Creatinine, Ser: 0.78 mg/dL (ref 0.57–1.00)
Globulin, Total: 2.9 g/dL (ref 1.5–4.5)
Glucose: 93 mg/dL (ref 65–99)
Potassium: 3.4 mmol/L — ABNORMAL LOW (ref 3.5–5.2)
Sodium: 138 mmol/L (ref 134–144)
Total Protein: 7.3 g/dL (ref 6.0–8.5)
eGFR: 98 mL/min/{1.73_m2} (ref >59)

## 2021-02-24 LAB — H PYLORI, IGM, IGG, IGA AB
H pylori, IgM Abs: <9 units (ref 0.0–8.9)
H. pylori, IgA Abs: <9 units (ref 0.0–8.9)
H. pylori, IgG AbS: 0.43 Index Value (ref 0.00–0.79)

## 2021-02-24 LAB — MAGNESIUM: Magnesium: 2 mg/dL (ref 1.6–2.3)

## 2021-02-24 NOTE — Telephone Encounter (Signed)
Patient verified DOB Patient is aware of labs being normal with slightly low potassium and needing to incorporate more in her diet.

## 2021-02-24 NOTE — Addendum Note (Signed)
Addended by: Kennieth Rad on: 02/24/2021 11:39 AM   Modules accepted: Orders

## 2021-02-24 NOTE — Telephone Encounter (Signed)
-----   Message from Kennieth Rad, [place] sent at 02/24/2021 11:39 AM EDT ----- Please call patient and let her know that her magnesium is WNL, her kidney and liver function is WNL, her potassium is a little low and she should eat a diet rich in potassium and have her levels rechecked in two weeks. Her screening for h pylori was negative.

## 2021-03-17 ENCOUNTER — Encounter: Payer: Self-pay | Admitting: Gastroenterology

## 2021-03-17 ENCOUNTER — Ambulatory Visit (INDEPENDENT_AMBULATORY_CARE_PROVIDER_SITE_OTHER): Payer: Self-pay | Admitting: Gastroenterology

## 2021-03-17 VITALS — BP 90/70 | HR 80 | Ht 67.0 in | Wt 187.2 lb

## 2021-03-17 DIAGNOSIS — R1310 Dysphagia, unspecified: Secondary | ICD-10-CM

## 2021-03-17 DIAGNOSIS — K219 Gastro-esophageal reflux disease without esophagitis: Secondary | ICD-10-CM

## 2021-03-17 DIAGNOSIS — R0789 Other chest pain: Secondary | ICD-10-CM
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
## 2021-03-17 NOTE — Progress Notes (Signed)
I agree with the assessment and plan as outlined by Ms. Zehr. 

## 2021-03-17 NOTE — Patient Instructions (Signed)
If you are age 41 or younger, your body mass index should be between 19-25. Your Body mass index is 29.33 kg/m. If this is out of the aformentioned range listed, please consider follow up with your Primary Care Provider.  __________________________________________________________  The [facility] providers would like to encourage you to use [facility] to communicate with providers for non-urgent requests or questions.  Due to long hold times on the telephone, sending your provider a message by [facility] may be a faster and more efficient way to get a response.  Please allow 48 business hours for a response.  Please remember that this is for non-urgent requests.   You have been scheduled for an endoscopy. Please follow written instructions given to you at your visit today. If you use inhalers (even only as needed), please bring them with you on the day of your procedure. If you find that the date and time you have been scheduled for do not work for you please call our office at [phone] to reschedule.  Follow up pending the results of your Endoscopy.  Thank you for entrusting me with your care and choosing [facility].  Alonza Bogus, PA-C

## 2021-03-24 ENCOUNTER — Other Ambulatory Visit: Payer: Self-pay

## 2021-03-25 ENCOUNTER — Other Ambulatory Visit: Payer: Self-pay

## 2021-04-05 ENCOUNTER — Other Ambulatory Visit: Payer: Self-pay

## 2021-04-08 ENCOUNTER — Encounter: Payer: Self-pay | Admitting: Nurse Practitioner

## 2021-04-08 ENCOUNTER — Other Ambulatory Visit: Payer: Self-pay

## 2021-04-08 ENCOUNTER — Telehealth (INDEPENDENT_AMBULATORY_CARE_PROVIDER_SITE_OTHER): Payer: Self-pay | Admitting: Nurse Practitioner

## 2021-04-08 ENCOUNTER — Ambulatory Visit: Payer: Self-pay | Admitting: Family Medicine

## 2021-04-08 DIAGNOSIS — R079 Chest pain, unspecified: Secondary | ICD-10-CM

## 2021-04-08 MED ORDER — TIZANIDINE HCL 4 MG PO TABS
4.0000 mg | ORAL_TABLET | Freq: Four times a day (QID) | ORAL | 0 refills | Status: DC | PRN
  Filled 2021-04-08: qty 30, 8d supply, fill #0

## 2021-04-08 NOTE — Progress Notes (Signed)
Virtual Visit via Telephone Note  I connected with Laurie Bell on 04/08/21 at 10:20 AM EDT by telephone and verified that I am speaking with the correct person using two identifiers.  Location: Patient: home Provider: office   I discussed the limitations, risks, security and privacy concerns of performing an evaluation and management service by telephone and the availability of in person appointments. I also discussed with the patient that there may be a patient responsible charge related to this service. The patient expressed understanding and agreed to proceed.   History of Present Illness:  Patient presents today to establish care through telephone visit.  She has recently been followed by GI for severe GERD.  She has been started on Protonix and Carafate.  Patient states that she has been having such severe reflux that she has been vomiting often.  She states that this is resulted in muscle strain through her left chest and back.  She does think that she is improving on her new medications.  Patient has not had regular health maintenance for several months due to lack of insurance.  We will be setting patient up with financial counseling.  We will schedule appointment for physical. Denies f/c/s, n/v/d, hemoptysis, PND, chest pain or edema.     Observations/Objective:  Vitals with BMI 03/17/2021 02/22/2021 08/27/2020  Height 5\' 7"  5\' 7"  -  Weight 187 lbs 4 oz 178 lbs -  BMI 32.44 01.02 -  Systolic 90 725 366  Diastolic 70 93 75  Pulse 80 82 73      Assessment and Plan:  Patient Instructions  Chest wall strain:  Will order Zanaflex  GERD:  Continue to follow with GI  Continue current medications  Follow up:  Follow up in 4-8 weeks for CPE with Laurie Bell     I discussed the assessment and treatment plan with the patient. The patient was provided an opportunity to ask questions and all were answered. The patient agreed with the plan and demonstrated an  understanding of the instructions.   The patient was advised to call back or seek an in-person evaluation if the symptoms worsen or if the condition fails to improve as anticipated.  I provided 23 minutes of non-face-to-face time during this encounter.   [name], NP

## 2021-04-08 NOTE — Patient Instructions (Addendum)
Chest wall strain:  Will order Zanaflex  GERD:  Continue to follow with GI  Continue current medications  Follow up:  Follow up in 4-8 weeks for CPE with Dr. Redmond Pulling

## 2021-04-14 ENCOUNTER — Ambulatory Visit: Payer: Self-pay | Admitting: Nurse Practitioner

## 2021-04-15 ENCOUNTER — Encounter: Payer: Self-pay | Admitting: Nurse Practitioner

## 2021-04-15 ENCOUNTER — Ambulatory Visit (INDEPENDENT_AMBULATORY_CARE_PROVIDER_SITE_OTHER): Payer: Self-pay | Admitting: Nurse Practitioner

## 2021-04-15 ENCOUNTER — Encounter: Payer: Self-pay | Admitting: Neurology

## 2021-04-15 ENCOUNTER — Other Ambulatory Visit: Payer: Self-pay

## 2021-04-15 VITALS — BP 120/85 | HR 72 | Resp 18

## 2021-04-15 DIAGNOSIS — R200 Anesthesia of skin: Secondary | ICD-10-CM

## 2021-04-15 DIAGNOSIS — I499 Cardiac arrhythmia, unspecified: Secondary | ICD-10-CM

## 2021-04-15 NOTE — Progress Notes (Signed)
@Patient  ID: Laurie Bell, female    DOB: [birth year], 40 y.o.   MRN: [id number]  No chief complaint on file.   Referring provider: No ref. provider found  HPI  Patient presents today for follow-up visit.  Patient was last seen by me on 04/08/2021 for left side pain and numbness.  Patient has been having issues for the past several weeks with this problem.  She has seen GI for severe GERD.  She was told that most likely her muscles were strained through her chest and back from reflux resulting with nausea and vomiting.  She was prescribed a muscle relaxer at her last visit with me.  She states that this did help but she woke up yesterday morning with right side face numbness.  She did go to the ED and received an injection and she was given a prescription for anti-inflammatory medication and muscle relaxer.  She states that she does feel slightly improved today.  She is no longer numb in her face.  She was worked up for possible stroke and states that everything was negative.  Upon exam in office today patient's heart rhythm is irregular.  EKG showed multiple PVCs.  Patient does have a history of hypokalemia.  We will check labs today.  We will place referral to cardiology.  Patient does have a history of a fall 2 years ago and has been having intermittent numbness and pain to the left side of her body since the fall.  We will place referral to neurology as well.  Denies f/c/s, n/v/d, hemoptysis, PND, chest pain or edema.     Allergies  Allergen Reactions   Latex Other (See Comments)    Burn in vaginal area with latex condoms     There is no immunization history on file for this patient.  Past Medical History:  Diagnosis Date   Chlamydia contact, treated    GERD (gastroesophageal reflux disease)     Tobacco History: Social History   Tobacco Use  Smoking Status Never  Smokeless Tobacco Never   Counseling given: Not Answered   Outpatient Encounter Medications as of 04/15/2021   Medication Sig   diclofenac Sodium (VOLTAREN) 1 % GEL Apply 2 g topically 4 (four) times daily.   hydrOXYzine (ATARAX/VISTARIL) 10 MG tablet Take 1 tablet (10 mg total) by mouth 3 (three) times daily as needed for anxiety.   pantoprazole (PROTONIX) 40 MG tablet Take 1 tablet (40 mg total) by mouth daily.   sucralfate (CARAFATE) 1 GM/10ML suspension Take 10 mLs (1 g total) by mouth 4 (four) times daily -  with meals and at bedtime.   tiZANidine (ZANAFLEX) 4 MG tablet Take 1 tablet (4 mg total) by mouth every 6 (six) hours as needed for muscle spasms.   [DISCONTINUED] medroxyPROGESTERone (PROVERA) 10 MG tablet Take 2 tablets by mouth daily (Patient not taking: Reported on 06/05/2019)   No facility-administered encounter medications on file as of 04/15/2021.     Review of Systems  Review of Systems  Constitutional: Negative.   HENT: Negative.    Cardiovascular: Negative.   Gastrointestinal: Negative.   Allergic/Immunologic: Negative.   Neurological:  Positive for numbness. Negative for facial asymmetry.  Psychiatric/Behavioral: Negative.        Physical Exam  BP 120/85   Pulse 72   Resp 18   SpO2 97%   Wt Readings from Last 5 Encounters:  03/17/21 187 lb 4 oz (84.9 kg)  02/22/21 178 lb (80.7 kg)  08/24/20 190 lb (86.2  kg)  02/01/20 187 lb 6.3 oz (85 kg)  01/09/18 150 lb (68 kg)     Physical Exam Vitals and nursing note reviewed.  Constitutional:      General: She is not in acute distress.    Appearance: She is well-developed.  Cardiovascular:     Rate and Rhythm: Normal rate. Rhythm irregular.  Pulmonary:     Effort: Pulmonary effort is normal.     Breath sounds: Normal breath sounds.  Neurological:     Mental Status: She is alert and oriented to person, place, and time.     Lab Results:  CBC    Component Value Date/Time   WBC 7.4 08/25/2020 0622   RBC 4.25 08/25/2020 0622   HGB 12.5 08/25/2020 0622   HCT 37.0 08/25/2020 0622   PLT 207 08/25/2020 0622    MCV 87.1 08/25/2020 0622   MCH 29.4 08/25/2020 0622   MCHC 33.8 08/25/2020 0622   RDW 12.6 08/25/2020 0622   LYMPHSABS 2.9 01/09/2018 0721   MONOABS 0.6 01/09/2018 0721   EOSABS 0.2 01/09/2018 0721   BASOSABS 0.0 01/09/2018 0721    BMET    Component Value Date/Time   NA 138 02/22/2021 1526   K 3.4 (L) 02/22/2021 1526   CL 100 02/22/2021 1526   CO2 22 08/25/2020 0622   GLUCOSE 93 02/22/2021 1526   GLUCOSE 108 (H) 08/25/2020 0622   BUN 7 02/22/2021 1526   CREATININE 0.78 02/22/2021 1526   CALCIUM 9.3 02/22/2021 1526   GFRNONAA >60 08/25/2020 0622   GFRAA >60 02/01/2020 2233    BNP No results found for: BNP  ProBNP No results found for: PROBNP  Imaging: No results found.   Assessment & Plan:   Numbness on left side Will place referral to neurology  Please complete medications prescribed by ED yesterday   Irregular Heart Rhythm:   Will order labs   Will order EKG  EKG showed frequent PVC's - will place referral to cardiology   Follow up:  Follow up with Dr. Redmond Pulling for CPE at first available      [name], NP 04/15/2021

## 2021-04-15 NOTE — Assessment & Plan Note (Signed)
Will place referral to neurology  Please complete medications prescribed by ED yesterday   Irregular Heart Rhythm:   Will order labs   Will order EKG  EKG showed frequent PVC's - will place referral to cardiology   Follow up:  Follow up with Dr. Redmond Pulling for CPE at first available

## 2021-04-15 NOTE — Patient Instructions (Addendum)
Numbness Left side of body:  Will place referral to neurology  Please complete medications prescribed by ED yesterday   Irregular Heart Rhythm:   Will order labs   Will order EKG  EKG showed frequent PVC's - will place referral to cardiology   Follow up:  Follow up with Dr. Redmond Pulling for CPE at first available

## 2021-04-16 ENCOUNTER — Other Ambulatory Visit: Payer: Self-pay

## 2021-04-16 ENCOUNTER — Emergency Department (HOSPITAL_COMMUNITY): Payer: Self-pay

## 2021-04-16 ENCOUNTER — Emergency Department (HOSPITAL_COMMUNITY)
Admission: EM | Admit: 2021-04-16 | Discharge: 2021-04-16 | Disposition: A | Discharge status: Home / Self Care | Payer: Self-pay | Attending: Emergency Medicine | Admitting: Emergency Medicine

## 2021-04-16 DIAGNOSIS — R110 Nausea: Secondary | ICD-10-CM | POA: Insufficient documentation

## 2021-04-16 DIAGNOSIS — Z91040 Latex allergy status: Secondary | ICD-10-CM | POA: Insufficient documentation

## 2021-04-16 DIAGNOSIS — R0789 Other chest pain: Secondary | ICD-10-CM | POA: Insufficient documentation

## 2021-04-16 LAB — BASIC METABOLIC PANEL
Anion gap: 6 (ref 5–15)
BUN: 8 mg/dL (ref 6–20)
CO2: 25 mmol/L (ref 22–32)
Calcium: 9.1 mg/dL (ref 8.9–10.3)
Chloride: 107 mmol/L (ref 98–111)
Creatinine, Ser: 0.73 mg/dL (ref 0.44–1.00)
GFR, Estimated: >60 mL/min (ref >60)
Glucose, Bld: 110 mg/dL — ABNORMAL HIGH (ref 70–99)
Potassium: 3.2 mmol/L — ABNORMAL LOW (ref 3.5–5.1)
Sodium: 138 mmol/L (ref 135–145)

## 2021-04-16 LAB — COMPREHENSIVE METABOLIC PANEL
ALT: 9 IU/L (ref 0–32)
AST: 12 IU/L (ref 0–40)
Albumin/Globulin Ratio: 1.7 (ref 1.2–2.2)
Albumin: 4.3 g/dL (ref 3.8–4.8)
Alkaline Phosphatase: 106 IU/L (ref 44–121)
BUN/Creatinine Ratio: 11 (ref 9–23)
BUN: 9 mg/dL (ref 6–24)
Bilirubin Total: 0.6 mg/dL (ref 0.0–1.2)
CO2: 25 mmol/L (ref 20–29)
Calcium: 9.3 mg/dL (ref 8.7–10.2)
Chloride: 104 mmol/L (ref 96–106)
Creatinine, Ser: 0.8 mg/dL (ref 0.57–1.00)
Globulin, Total: 2.6 g/dL (ref 1.5–4.5)
Glucose: 95 mg/dL (ref 70–99)
Potassium: 3.6 mmol/L (ref 3.5–5.2)
Sodium: 140 mmol/L (ref 134–144)
Total Protein: 6.9 g/dL (ref 6.0–8.5)
eGFR: 95 mL/min/{1.73_m2} (ref >59)

## 2021-04-16 LAB — CBC
HCT: 38.7 % (ref 36.0–46.0)
Hemoglobin: 13.1 g/dL (ref 12.0–15.0)
MCH: 29.2 pg (ref 26.0–34.0)
MCHC: 33.9 g/dL (ref 30.0–36.0)
MCV: 86.2 fL (ref 80.0–100.0)
Platelets: 243 10*3/uL (ref 150–400)
RBC: 4.49 MIL/uL (ref 3.87–5.11)
RDW: 12.6 % (ref 11.5–15.5)
WBC: 5.7 10*3/uL (ref 4.0–10.5)
nRBC: 0 % (ref 0.0–0.2)

## 2021-04-16 LAB — TROPONIN I (HIGH SENSITIVITY)
Troponin I (High Sensitivity): <2 ng/L (ref <18)
Troponin I (High Sensitivity): <2 ng/L (ref <18)

## 2021-04-16 LAB — I-STAT BETA HCG BLOOD, ED (MC, WL, AP ONLY): I-stat hCG, quantitative: <5 m[IU]/mL (ref <5)

## 2021-04-16 MED ORDER — LIDOCAINE VISCOUS HCL 2 % MT SOLN
15.0000 mL | Freq: Once | OROMUCOSAL | Status: AC
  Administered 2021-04-16: 15 mL via ORAL
  Filled 2021-04-16: qty 15

## 2021-04-16 MED ORDER — IBUPROFEN 200 MG PO TABS
400.0000 mg | ORAL_TABLET | Freq: Once | ORAL | Status: AC
  Administered 2021-04-16: 400 mg via ORAL
  Filled 2021-04-16: qty 2

## 2021-04-16 MED ORDER — ALUM & MAG HYDROXIDE-SIMETH 200-200-20 MG/5ML PO SUSP
30.0000 mL | Freq: Once | ORAL | Status: AC
  Administered 2021-04-16: 30 mL via ORAL
  Filled 2021-04-16: qty 30

## 2021-04-16 MED ORDER — NAPROXEN 375 MG PO TABS: 375.0000 mg | ORAL_TABLET | Freq: Two times a day (BID) | ORAL | 0 refills | Status: DC

## 2021-04-16 NOTE — ED Provider Notes (Signed)
Emergency Medicine Provider Triage Evaluation Note  Laurie Bell , a 40 y.o. female  was evaluated in triage.  Pt complains of substernal chest pain which she describes as a sharp sensation.  Pain started on Thursday.  Also reports associated left-sided numbness.  Has a history of pinched nerves.  Also has a history of panic attacks and severe GERD.  Belching relieves the pain.  Review of Systems  Positive: Shortness of breath Negative: Fever, chills, abdominal pain, nausea, vomiting  Physical Exam  BP (!) 145/84 (BP Location: Left Arm)   Pulse 77   Temp 98.3 F (36.8 C) (Oral)   Resp 17   Ht 5\' 7"  (1.702 m)   Wt 81.6 kg   LMP 04/16/2021   SpO2 98%   BMI 28.19 kg/m  Gen:   Awake, no distress   Resp:  Normal effort  MSK:   Moves extremities without difficulty  Other:  Chest pain reproducible with palpation of the chest wall  Medical Decision Making  Medically screening exam initiated at 11:32 AM.  Appropriate orders placed.  Laurie N [place] was informed that the remainder of the evaluation will be completed by another provider, this initial triage assessment does not replace that evaluation, and the importance of remaining in the ED until their evaluation is complete.     Myna Bright Tow, PA-C 04/16/21 1133    Dorie Rank, MD 04/17/21 [phone]

## 2021-04-16 NOTE — ED Triage Notes (Signed)
Patient reports chest pain x3 days , progressively worse, pain rated 8/10. Patient was at urgent care Thursday, says hr was elevated and was referred to cardio. Patient says she is not having anxiety attack.

## 2021-04-16 NOTE — Discharge Instructions (Signed)
Test today were reassuring.  No signs of pneumonia, heart attack or heart injury.  Take the medication to help with pain and inflammation.

## 2021-04-16 NOTE — ED Provider Notes (Signed)
[facility] DEPT Provider Note   CSN: [id number] Arrival date & time: 04/16/21  1110     History Chief complaint: Chest pain  Laurie Bell is a 40 y.o. female.  HPI  HPI: A 40 year old patient presents for evaluation of chest pain. Initial onset of pain was less than one hour ago. The patient's chest pain is sharp and is not worse with exertion. The patient complains of nausea. The patient's chest pain is middle- or left-sided, is not well-localized, is not described as heaviness/pressure/tightness and does radiate to the arms/jaw/neck. The patient denies diaphoresis. The patient has no history of stroke, has no history of peripheral artery disease, has not smoked in the past 90 days, denies any history of treated diabetes, has no relevant family history of coronary artery disease (first degree relative at less than age 58), is not hypertensive, has no history of hypercholesterolemia and does not have an elevated BMI (>=30).   Past Medical History:  Diagnosis Date   Chlamydia contact, treated    GERD (gastroesophageal reflux disease)     Patient Active Problem List   Diagnosis Date Noted   Irregular heart rhythm 04/15/2021   Numbness on left side 04/15/2021   Dysphagia 03/17/2021   Hypomagnesemia 02/23/2021   Gastroesophageal reflux disease    Hypokalemia 08/24/2020   Atypical chest pain 08/24/2020   Nausea and vomiting 08/24/2020   Hypocalcemia 08/24/2020   DUB (dysfunctional uterine bleeding) 01/12/2017   Fibroid, uterine 01/12/2017    Past Surgical History:  Procedure Laterality Date   ENDOMETRIAL ABLATION N/A 04/25/2017   Procedure: ENDOMETRIAL ABLATION With NOVASURE;  Surgeon: Woodroe Mode, MD;  Location: [place];  Service: Gynecology;  Laterality: N/A;   TUBAL LIGATION       OB History     Gravida  2   Para  2   Term  2   Preterm  0   AB  0   Living  2      SAB  0   IAB  0   Ectopic  0    Multiple  0   Live Births              Family History  Problem Relation Age of Onset   Hypertension Mother    Cancer Maternal Grandmother        type unknown   Hypertension Maternal Aunt     Social History   Tobacco Use   Smoking status: Never   Smokeless tobacco: Never  Vaping Use   Vaping Use: Never used  Substance Use Topics   Alcohol use: No   Drug use: Not Currently    Types: Marijuana    Comment: quit 2018    Home Medications Prior to Admission medications   Medication Sig Start Date End Date Taking? Authorizing Provider  acetaminophen (TYLENOL) 500 MG tablet Take 1,000 mg by mouth every 6 (six) hours as needed for mild pain.   Yes [provider]  diclofenac (VOLTAREN) 75 MG EC tablet Take 75 mg by mouth 2 (two) times daily.   Yes [provider]  diclofenac Sodium (VOLTAREN) 1 % GEL Apply 2 g topically 4 (four) times daily. 08/27/20  Yes Mariel Aloe, MD  hydrOXYzine (ATARAX/VISTARIL) 10 MG tablet Take 1 tablet (10 mg total) by mouth 3 (three) times daily as needed for anxiety. 02/22/21  Yes Mayers, Cari S, PA-C  naproxen (NAPROSYN) 375 MG tablet Take 1 tablet (375 mg total) by  mouth 2 (two) times daily. 04/16/21  Yes Dorie Rank, MD  pantoprazole (PROTONIX) 40 MG tablet Take 1 tablet (40 mg total) by mouth daily. 02/22/21  Yes Mayers, Cari S, PA-C  sucralfate (CARAFATE) 1 GM/10ML suspension Take 10 mLs (1 g total) by mouth 4 (four) times daily -  with meals and at bedtime. Patient taking differently: Take 1 g by mouth 4 (four) times daily as needed (stomach upset/acid reflux). 02/22/21  Yes Mayers, Cari S, PA-C  tiZANidine (ZANAFLEX) 4 MG tablet Take 1 tablet (4 mg total) by mouth every 6 (six) hours as needed for muscle spasms. 04/08/21  Yes [name], NP  medroxyPROGESTERone (PROVERA) 10 MG tablet Take 2 tablets by mouth daily Patient not taking: Reported on 06/05/2019 02/06/18 02/02/20  Sloan Leiter, MD    Allergies    Latex  Review  of Systems   Review of Systems  All other systems reviewed and are negative.  Physical Exam Updated Vital Signs BP 125/80 (BP Location: Right Arm)   Pulse 84   Temp 98.3 F (36.8 C) (Oral)   Resp 12   Ht 1.702 m (5\' 7" )   Wt 81.6 kg   LMP 04/16/2021   SpO2 100%   BMI 28.19 kg/m   Physical Exam Vitals and nursing note reviewed.  Constitutional:      General: She is not in acute distress.    Appearance: She is well-developed.  HENT:     Head: Normocephalic and atraumatic.     Right Ear: External ear normal.     Left Ear: External ear normal.  Eyes:     General: No scleral icterus.       Right eye: No discharge.        Left eye: No discharge.     Conjunctiva/sclera: Conjunctivae normal.  Neck:     Trachea: No tracheal deviation.  Cardiovascular:     Rate and Rhythm: Normal rate and regular rhythm.  Pulmonary:     Effort: Pulmonary effort is normal. No respiratory distress.     Breath sounds: Normal breath sounds. No stridor. No wheezing or rales.  Chest:     Chest wall: Tenderness present.  Abdominal:     General: Bowel sounds are normal. There is no distension.     Palpations: Abdomen is soft.     Tenderness: There is no abdominal tenderness. There is no guarding or rebound.  Musculoskeletal:        General: No tenderness or deformity.     Cervical back: Neck supple.  Skin:    General: Skin is warm and dry.     Findings: No rash.  Neurological:     General: No focal deficit present.     Mental Status: She is alert.     Cranial Nerves: No cranial nerve deficit (no facial droop, extraocular movements intact, no slurred speech).     Sensory: No sensory deficit.     Motor: No abnormal muscle tone or seizure activity.     Coordination: Coordination normal.  Psychiatric:        Mood and Affect: Mood normal.    ED Results / Procedures / Treatments   Labs (all labs ordered are listed, but only abnormal results are displayed) Labs Reviewed  BASIC METABOLIC PANEL  - Abnormal; Notable for the following components:      Result Value   Potassium 3.2 (*)    Glucose, Bld 110 (*)    All other components within normal limits  CBC  I-STAT BETA HCG BLOOD, ED ([facility], [facility], [facility] ONLY)  TROPONIN I (HIGH SENSITIVITY)  TROPONIN I (HIGH SENSITIVITY)    EKG EKG Interpretation  Date/Time:  Saturday April 16 2021 11:38:53 EDT Ventricular Rate:  78 PR Interval:  164 QRS Duration: 89 QT Interval:  398 QTC Calculation: 456 R Axis:   57 Text Interpretation: Sinus rhythm Ventricular trigeminy Low voltage, precordial leads Probable anteroseptal infarct, old No significant change since last tracing Confirmed by Dorie Rank [phone]) on 04/16/2021 2:30:05 PM  Radiology DG Chest 2 View  Result Date: 04/16/2021 CLINICAL DATA:  Worsening chest pain for 3 days. EXAM: CHEST - 2 VIEW COMPARISON:  08/24/2020 FINDINGS: The heart size and mediastinal contours are within normal limits. Both lungs are clear. The visualized skeletal structures are unremarkable. IMPRESSION: Normal exam. Electronically Signed   By: Marlaine Hind M.D.   On: 04/16/2021 12:52    Procedures Procedures   Medications Ordered in ED Medications  alum & mag hydroxide-simeth (MAALOX/MYLANTA) 200-200-20 MG/5ML suspension 30 mL (30 mLs Oral Given 04/16/21 1455)    And  lidocaine (XYLOCAINE) 2 % viscous mouth solution 15 mL (15 mLs Oral Given 04/16/21 1455)  ibuprofen (ADVIL) tablet 400 mg (400 mg Oral Given 04/16/21 1455)    ED Course  I have reviewed the triage vital signs and the nursing notes.  Pertinent labs & imaging results that were available during my care of the patient were reviewed by me and considered in my medical decision making (see chart for details).  Clinical Course as of 04/16/21 1535  Sat Apr 16, 2021  1443 CBC normal.  Troponin and metabolic panel normal [JK]  1443 Chest x-ray without acute findings [JK]    Clinical Course User Index [JK] Dorie Rank, MD   MDM  Rules/Calculators/A&P HEAR Score: 1                         Patient presented with chest pain.  Low risk heart score.  ED work-up is reassuring.  EKG and serial troponins are unremarkable.  No signs of pneumonia on chest x-ray.  No pneumothorax.  Low risk for PE.  Doubt pulmonary embolism.  Patient has reproducible chest wall tenderness.  Suspect this is etiology for her pain.  Will treat symptomatically.  Stable for discharge Final Clinical Impression(s) / ED Diagnoses Final diagnoses:  Chest wall pain    Rx / DC Orders ED Discharge Orders          Ordered    naproxen (NAPROSYN) 375 MG tablet  2 times daily        04/16/21 1534             Dorie Rank, MD 04/16/21 1536

## 2021-04-20 ENCOUNTER — Encounter: Payer: Self-pay | Admitting: Family Medicine

## 2021-04-20 ENCOUNTER — Ambulatory Visit (INDEPENDENT_AMBULATORY_CARE_PROVIDER_SITE_OTHER): Payer: Self-pay | Admitting: Family Medicine

## 2021-04-20 ENCOUNTER — Other Ambulatory Visit: Payer: Self-pay

## 2021-04-20 VITALS — BP 111/76 | HR 81 | Temp 98.1°F | Resp 16 | Wt 187.0 lb

## 2021-04-20 DIAGNOSIS — F419 Anxiety disorder, unspecified: Secondary | ICD-10-CM

## 2021-04-20 DIAGNOSIS — K219 Gastro-esophageal reflux disease without esophagitis: Secondary | ICD-10-CM

## 2021-04-20 DIAGNOSIS — I499 Cardiac arrhythmia, unspecified: Secondary | ICD-10-CM

## 2021-04-20 MED ORDER — PAROXETINE HCL 20 MG PO TABS
20.0000 mg | ORAL_TABLET | Freq: Every day | ORAL | 0 refills | Status: DC
  Filled 2021-04-20: qty 30, 30d supply, fill #0

## 2021-04-20 MED ORDER — SUCRALFATE 1 GM/10ML PO SUSP
1.0000 g | Freq: Three times a day (TID) | ORAL | 0 refills | Status: DC
  Filled 2021-04-20: qty 120, 3d supply, fill #0

## 2021-04-20 NOTE — Progress Notes (Signed)
Patient c/o acid reflux that has been  Patient c/o panic attacks that has been going for awhile . Patient said that she feel like it's a combination the reflux medication .  Patient has had several EKG's this pass few days and she's scared that something is wrong with her heart.  Patient has appt with Neurogist coming up soon.

## 2021-04-20 NOTE — Progress Notes (Signed)
Established Patient Office Visit  Subjective:  Patient ID: Laurie Bell, female    DOB: [birth year]  Age: 40 y.o. MRN: [id number]  CC:  Chief Complaint  Patient presents with   Follow-up    HPI [facility] presents for complaint of intermittent chest discomfort. She was seen in past couple of days at ED and with not acute changes. She has been referred to cardiology for further eval/mgt. Patient also reports anxiety about her sx and know that her sx can actually be a manifestation of that anxiety.   Past Medical History:  Diagnosis Date   Chlamydia contact, treated    GERD (gastroesophageal reflux disease)     Past Surgical History:  Procedure Laterality Date   ENDOMETRIAL ABLATION N/A 04/25/2017   Procedure: ENDOMETRIAL ABLATION With NOVASURE;  Surgeon: Woodroe Mode, MD;  Location: [place];  Service: Gynecology;  Laterality: N/A;   TUBAL LIGATION      Family History  Problem Relation Age of Onset   Hypertension Mother    Cancer Maternal Grandmother        type unknown   Hypertension Maternal Aunt     Social History   Socioeconomic History   Marital status: Single    Spouse name: Not on file   Number of children: 2   Years of education: Not on file   Highest education level: Not on file  Occupational History   Occupation: Best boy: [employer]  Tobacco Use   Smoking status: Never   Smokeless tobacco: Never  Vaping Use   Vaping Use: Never used  Substance and Sexual Activity   Alcohol use: No   Drug use: Not Currently    Types: Marijuana    Comment: quit 2018   Sexual activity: Not Currently    Birth control/protection: Surgical  Other Topics Concern   Not on file  Social History Narrative   Not on file   Social Determinants of Health   Financial Resource Strain: Not on file  Food Insecurity: Not on file  Transportation Needs: Not on file  Physical Activity: Not on file  Stress: Not on file  Social  Connections: Not on file  Intimate Partner Violence: Not on file    ROS Review of Systems  Psychiatric/Behavioral:  Negative for self-injury, sleep disturbance and suicidal ideas. The patient is nervous/anxious.   All other systems reviewed and are negative.  Objective:   Today's Vitals: BP 111/76   Pulse 81   Temp 98.1 F (36.7 C) (Oral)   Resp 16   Wt 187 lb (84.8 kg)   LMP 04/16/2021   SpO2 98%   BMI 29.29 kg/m   Physical Exam Vitals and nursing note reviewed.  Constitutional:      General: She is not in acute distress. Cardiovascular:     Rate and Rhythm: Normal rate and regular rhythm.  Pulmonary:     Effort: Pulmonary effort is normal.     Breath sounds: Normal breath sounds.  Abdominal:     Palpations: Abdomen is soft.     Tenderness: There is no abdominal tenderness.  Neurological:     General: No focal deficit present.     Mental Status: She is alert and oriented to person, place, and time.  Psychiatric:        Mood and Affect: Mood is anxious.        Behavior: Behavior normal.    Assessment & Plan:   1. Anxiety Paxil  20 mg daily prescribed. Will monitor - PARoxetine (PAXIL) 20 MG tablet; Take 1 tablet (20 mg total) by mouth daily.  Dispense: 30 tablet; Refill: 0  2. Gastroesophageal reflux disease, unspecified whether esophagitis present Carafate prescribed - sucralfate (CARAFATE) 1 GM/10ML suspension; Take 10 mLs (1 g total) by mouth 4 (four) times daily -  with meals and at bedtime.  Dispense: 120 mL; Refill: 0  3. Irregular heart rhythm Management as per consultant    Outpatient Encounter Medications as of 04/20/2021  Medication Sig   acetaminophen (TYLENOL) 500 MG tablet Take 1,000 mg by mouth every 6 (six) hours as needed for mild pain.   diclofenac (VOLTAREN) 75 MG EC tablet Take 75 mg by mouth 2 (two) times daily.   diclofenac Sodium (VOLTAREN) 1 % GEL Apply 2 g topically 4 (four) times daily.   hydrOXYzine (ATARAX/VISTARIL) 10 MG tablet  Take 1 tablet (10 mg total) by mouth 3 (three) times daily as needed for anxiety.   naproxen (NAPROSYN) 375 MG tablet Take 1 tablet (375 mg total) by mouth 2 (two) times daily.   pantoprazole (PROTONIX) 40 MG tablet Take 1 tablet (40 mg total) by mouth daily.   PARoxetine (PAXIL) 20 MG tablet Take 1 tablet (20 mg total) by mouth daily.   tiZANidine (ZANAFLEX) 4 MG tablet Take 1 tablet (4 mg total) by mouth every 6 (six) hours as needed for muscle spasms.   [DISCONTINUED] sucralfate (CARAFATE) 1 GM/10ML suspension Take 10 mLs (1 g total) by mouth 4 (four) times daily -  with meals and at bedtime. (Patient taking differently: Take 1 g by mouth 4 (four) times daily as needed (stomach upset/acid reflux).)   sucralfate (CARAFATE) 1 GM/10ML suspension Take 10 mLs (1 g total) by mouth 4 (four) times daily -  with meals and at bedtime.   [DISCONTINUED] medroxyPROGESTERone (PROVERA) 10 MG tablet Take 2 tablets by mouth daily (Patient not taking: Reported on 06/05/2019)   No facility-administered encounter medications on file as of 04/20/2021.    Follow-up: Return in about 4 weeks (around 05/18/2021) for follow up.   Becky Sax, MD

## 2021-04-25 ENCOUNTER — Ambulatory Visit: Payer: Self-pay

## 2021-04-25 ENCOUNTER — Other Ambulatory Visit: Payer: Self-pay

## 2021-04-28 ENCOUNTER — Other Ambulatory Visit: Payer: Self-pay

## 2021-04-28 ENCOUNTER — Encounter: Payer: Self-pay | Admitting: Internal Medicine

## 2021-04-28 ENCOUNTER — Ambulatory Visit (AMBULATORY_SURGERY_CENTER): Payer: Self-pay | Admitting: Internal Medicine

## 2021-04-28 VITALS — BP 115/86 | HR 76 | Temp 98.6°F | Resp 15 | Ht 67.0 in | Wt 187.0 lb

## 2021-04-28 DIAGNOSIS — K219 Gastro-esophageal reflux disease without esophagitis: Secondary | ICD-10-CM

## 2021-04-28 DIAGNOSIS — K259 Gastric ulcer, unspecified as acute or chronic, without hemorrhage or perforation: Secondary | ICD-10-CM

## 2021-04-28 DIAGNOSIS — K319 Disease of stomach and duodenum, unspecified: Secondary | ICD-10-CM

## 2021-04-28 DIAGNOSIS — K225 Diverticulum of esophagus, acquired: Secondary | ICD-10-CM

## 2021-04-28 MED ORDER — PANTOPRAZOLE SODIUM 40 MG PO TBEC
40.0000 mg | DELAYED_RELEASE_TABLET | Freq: Two times a day (BID) | ORAL | 1 refills | Status: DC
Start: 2021-04-28 — End: 2021-08-26
  Filled 2021-04-28: qty 60, 30d supply, fill #0
  Filled 2021-05-25: qty 60, 30d supply, fill #1
  Filled 2021-06-27: qty 60, 30d supply, fill #2
  Filled 2021-06-27: qty 60, 30d supply, fill #0
  Filled 2021-07-21: qty 60, 30d supply, fill #1

## 2021-04-28 MED ORDER — SODIUM CHLORIDE 0.9 % IV SOLN: 500.0000 mL | INTRAVENOUS | Status: DC

## 2021-04-28 NOTE — Progress Notes (Signed)
Called to room to assist during endoscopic procedure.  Patient ID and intended procedure confirmed with present staff. Received instructions for my participation in the procedure from the performing physician.  

## 2021-04-28 NOTE — Progress Notes (Signed)
Report to PACU, RN, vss, BBS= Clear.  

## 2021-04-28 NOTE — Patient Instructions (Addendum)
Hold Naproxen, use Tylenol instead Pick up Protonix Rx from [facility]: Refer to the procedure report and other information in the discharge instructions given to you for any specific questions about what was found during the examination. If this information does not answer your questions, please call [place] office at [phone] to clarify.   YOU SHOULD EXPECT: Some feelings of bloating in the abdomen. Passage of more gas than usual. Walking can help get rid of the air that was put into your GI tract during the procedure and reduce the bloating. If you had a lower endoscopy (such as a colonoscopy or flexible sigmoidoscopy) you may notice spotting of blood in your stool or on the toilet paper. Some abdominal soreness may be present for a day or two, also.  DIET: Your first meal following the procedure should be a light meal and then it is ok to progress to your normal diet. A half-sandwich or bowl of soup is an example of a good first meal. Heavy or fried foods are harder to digest and may make you feel nauseous or bloated. Drink plenty of fluids but you should avoid alcoholic beverages for 24 hours. If you had a esophageal dilation, please see attached instructions for diet.    ACTIVITY: Your care partner should take you home directly after the procedure. You should plan to take it easy, moving slowly for the rest of the day. You can resume normal activity the day after the procedure however YOU SHOULD NOT DRIVE, use power tools, machinery or perform tasks that involve climbing or major physical exertion for 24 hours (because of the sedation medicines used during the test).   SYMPTOMS TO REPORT IMMEDIATELY: A gastroenterologist can be reached at any hour. Please call [phone]  for any of the following symptoms:  Following upper endoscopy (EGD, EUS, ERCP, esophageal dilation) Vomiting of blood or coffee ground material  New, significant abdominal pain   New, significant chest pain or pain under the shoulder blades  Painful or persistently difficult swallowing  New shortness of breath  Black, tarry-looking or red, bloody stools  FOLLOW UP:  If any biopsies were taken you will be contacted by phone or by letter within the next 1-3 weeks. Call [phone]  if you have not heard about the biopsies in 3 weeks.  Please also call with any specific questions about appointments or follow up tests.

## 2021-04-28 NOTE — Progress Notes (Signed)
Pt's states no medical or surgical changes since previsit or office visit. 

## 2021-04-28 NOTE — Progress Notes (Signed)
GASTROENTEROLOGY PROCEDURE H&P NOTE   Primary Care Physician: Dorna Mai, MD    Reason for Procedure:   Atypical chest pain, dysphagia  Plan:    EGD  Patient is appropriate for endoscopic procedure(s) in the ambulatory ([place]) setting.  The nature of the procedure, as well as the risks, benefits, and alternatives were carefully and thoroughly reviewed with the patient. Ample time for discussion and questions allowed. The patient understood, was satisfied, and agreed to proceed.     HPI: Laurie Bell is a 40 y.o. female who presents for EGD for atypical chest pain and dysphagia  Past Medical History:  Diagnosis Date   Anxiety    Chlamydia contact, treated    GERD (gastroesophageal reflux disease)     Past Surgical History:  Procedure Laterality Date   ENDOMETRIAL ABLATION N/A 04/25/2017   Procedure: ENDOMETRIAL ABLATION With NOVASURE;  Surgeon: Woodroe Mode, MD;  Location: [facility];  Service: Gynecology;  Laterality: N/A;   TUBAL LIGATION      Prior to Admission medications   Medication Sig Start Date End Date Taking? Authorizing Provider  diclofenac (VOLTAREN) 75 MG EC tablet Take 75 mg by mouth 2 (two) times daily.   Yes [provider]  naproxen (NAPROSYN) 375 MG tablet Take 1 tablet (375 mg total) by mouth 2 (two) times daily. 04/16/21  Yes Dorie Rank, MD  PARoxetine (PAXIL) 20 MG tablet Take 1 tablet (20 mg total) by mouth daily. 04/20/21  Yes Dorna Mai, MD  sucralfate (CARAFATE) 1 GM/10ML suspension Take 10 mLs (1 g total) by mouth 4 (four) times daily -  with meals and at bedtime. 04/20/21  Yes Dorna Mai, MD  tiZANidine (ZANAFLEX) 4 MG tablet Take 1 tablet (4 mg total) by mouth every 6 (six) hours as needed for muscle spasms. 04/08/21  Yes [name], NP  acetaminophen (TYLENOL) 500 MG tablet Take 1,000 mg by mouth every 6 (six) hours as needed for mild pain.    [provider]  diclofenac Sodium  (VOLTAREN) 1 % GEL Apply 2 g topically 4 (four) times daily. 08/27/20   Mariel Aloe, MD  hydrOXYzine (ATARAX/VISTARIL) 10 MG tablet Take 1 tablet (10 mg total) by mouth 3 (three) times daily as needed for anxiety. 02/22/21   Mayers, Cari S, PA-C  pantoprazole (PROTONIX) 40 MG tablet Take 1 tablet (40 mg total) by mouth 2 (two) times daily before a meal. 04/28/21   Sharyn Creamer, MD  medroxyPROGESTERone (PROVERA) 10 MG tablet Take 2 tablets by mouth daily Patient not taking: Reported on 06/05/2019 02/06/18 02/02/20  Sloan Leiter, MD    Current Outpatient Medications  Medication Sig Dispense Refill   diclofenac (VOLTAREN) 75 MG EC tablet Take 75 mg by mouth 2 (two) times daily.     naproxen (NAPROSYN) 375 MG tablet Take 1 tablet (375 mg total) by mouth 2 (two) times daily. 30 tablet 0   PARoxetine (PAXIL) 20 MG tablet Take 1 tablet (20 mg total) by mouth daily. 30 tablet 0   sucralfate (CARAFATE) 1 GM/10ML suspension Take 10 mLs (1 g total) by mouth 4 (four) times daily -  with meals and at bedtime. 120 mL 0   tiZANidine (ZANAFLEX) 4 MG tablet Take 1 tablet (4 mg total) by mouth every 6 (six) hours as needed for muscle spasms. 30 tablet 0   acetaminophen (TYLENOL) 500 MG tablet Take 1,000 mg by mouth every 6 (six) hours as needed for mild pain.  diclofenac Sodium (VOLTAREN) 1 % GEL Apply 2 g topically 4 (four) times daily. 100 g 0   hydrOXYzine (ATARAX/VISTARIL) 10 MG tablet Take 1 tablet (10 mg total) by mouth 3 (three) times daily as needed for anxiety. 60 tablet 0   pantoprazole (PROTONIX) 40 MG tablet Take 1 tablet (40 mg total) by mouth 2 (two) times daily before a meal. 120 tablet 1   No current facility-administered medications for this visit.    Allergies as of 04/28/2021 - Review Complete 04/28/2021  Allergen Reaction Noted   Latex Other (See Comments) 04/18/2017    Family History  Problem Relation Age of Onset   Hypertension Mother    Cancer Maternal Grandmother         type unknown   Hypertension Maternal Aunt     Social History   Socioeconomic History   Marital status: Single    Spouse name: Not on file   Number of children: 2   Years of education: Not on file   Highest education level: Not on file  Occupational History   Occupation: Best boy: [employer]  Tobacco Use   Smoking status: Never   Smokeless tobacco: Never  Vaping Use   Vaping Use: Never used  Substance and Sexual Activity   Alcohol use: No   Drug use: Not Currently    Types: Marijuana    Comment: quit 2018   Sexual activity: Not Currently    Birth control/protection: Surgical  Other Topics Concern   Not on file  Social History Narrative   Not on file   Social Determinants of Health   Financial Resource Strain: Not on file  Food Insecurity: Not on file  Transportation Needs: Not on file  Physical Activity: Not on file  Stress: Not on file  Social Connections: Not on file  Intimate Partner Violence: Not on file    Physical Exam: Vital signs in last 24 hours: BP 115/86   Pulse 76   Temp 98.6 F (37 C) (Temporal)   Resp 15   Ht 5\' 7"  (1.702 m)   Wt 187 lb (84.8 kg)   LMP 04/16/2021   SpO2 100%   BMI 29.29 kg/m  GEN: NAD EYE: Sclerae anicteric ENT: MMM CV: Non-tachycardic Pulm: No increased work of breathing GI: Soft, NT/ND NEURO:  Alert & Oriented   Christia Reading, MD [facility]  04/28/2021 4:29 PM

## 2021-04-28 NOTE — Op Note (Addendum)
[facility] Patient Name: Dung Prien Procedure Date: 04/28/2021 10:38 AM MRN: [id number] Endoscopist: Sonny Masters "Christia Reading ,  Age: 40 Referring MD:  Date of Birth: [birth year] Gender: Female Account #: [id number] Procedure:                Upper GI endoscopy Indications:              Dysphagia, Heartburn Medicines:                Monitored Anesthesia Care Procedure:                Pre-Anesthesia Assessment:                           - Prior to the procedure, a History and Physical                            was performed, and patient medications and                            allergies were reviewed. The patient's tolerance of                            previous anesthesia was also reviewed. The risks                            and benefits of the procedure and the sedation                            options and risks were discussed with the patient.                            All questions were answered, and informed consent                            was obtained. Prior Anticoagulants: The patient has                            taken no previous anticoagulant or antiplatelet                            agents. ASA Grade Assessment: II - A patient with                            mild systemic disease. After reviewing the risks                            and benefits, the patient was deemed in                            satisfactory condition to undergo the procedure.                           After obtaining informed consent, the endoscope was  passed under direct vision. Throughout the                            procedure, the patient's blood pressure, pulse, and                            oxygen saturations were monitored continuously. The                            Endoscope was introduced through the mouth, and                            advanced to the second part of duodenum. The upper                            GI endoscopy was  accomplished without difficulty.                            The patient tolerated the procedure well. Scope In: Scope Out: Findings:                 A non-bleeding diverticulum with a small opening                            and no stigmata of recent bleeding was found at the                            cricopharyngeus.                           Biopsies were taken with a cold forceps in the                            proximal esophagus and in the distal esophagus for                            histology.                           Four non-bleeding cratered gastric ulcers with a                            clean ulcer base (Forrest Class III) were found at                            the pylorus. The largest lesion was 8 mm in largest                            dimension. Biopsies were taken with a cold forceps                            for Helicobacter pylori testing.                           The  examined duodenum was normal. Complications:            No immediate complications. Estimated Blood Loss:     Estimated blood loss was minimal. 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                           - Non-bleeding gastric ulcers with a clean ulcer                            base (Forrest Class III). Biopsied.                           - Normal examined duodenum.                           - Biopsies were taken with a cold forceps for                            histology in the proximal esophagus and in the                            distal esophagus. Recommendation:           - Discharge patient to home (with escort).                           - Await pathology results.                           - Use a proton pump inhibitor PO BID for 8 weeks.                           - Repeat upper endoscopy in 8 weeks to check                            healing.                           - No aspirin, ibuprofen, naproxen, or other                            non-steroidal  anti-inflammatory drugs. Okay to use                            Tylenol.                           - The findings and recommendations were discussed                            with the patient. Sonny Masters "Christia Reading,  04/28/2021 11:12:57 AM

## 2021-04-29 ENCOUNTER — Other Ambulatory Visit: Payer: Self-pay

## 2021-04-29 ENCOUNTER — Telehealth: Payer: Self-pay

## 2021-04-29 ENCOUNTER — Ambulatory Visit: Payer: Self-pay | Attending: Family Medicine

## 2021-04-29 DIAGNOSIS — K219 Gastro-esophageal reflux disease without esophagitis: Secondary | ICD-10-CM

## 2021-04-29 MED ORDER — SUCRALFATE 1 GM/10ML PO SUSP
1.0000 g | Freq: Three times a day (TID) | ORAL | 2 refills | Status: DC
  Filled 2021-04-29: qty 840, 21d supply, fill #0
  Filled 2021-04-29: qty 420, 11d supply, fill #0
  Filled 2021-05-10: qty 1260, 32d supply, fill #1
  Filled 2021-05-25: qty 1140, 29d supply, fill #1
  Filled 2021-05-25: qty 120, 3d supply, fill #1

## 2021-04-29 NOTE — Telephone Encounter (Signed)
Per Dr. Libby Maw request, called pt to schedule repeat EGD 8 weeks following 11/17 EGD to eval healing of gastric ulcers. Unable to reach d/t no answer. Will continue efforts to reach pt.

## 2021-04-29 NOTE — Telephone Encounter (Signed)
Pt returned call. Scheduled for repeat EGD @ [facility] on 06/23/21 @ 10am, arrival time 9am. Amb referral placed. Pt declined to have repeat prep instructions sent to home or via My Chart. States, "I have them at home." Advised that I will still send to ensure she has the accurate information for her procedure. Verbalized acceptance and understanding. Requested refill of Carafate be sent to preferred pharmacy. Rx sent as requested.

## 2021-05-02 ENCOUNTER — Other Ambulatory Visit: Payer: Self-pay

## 2021-05-02 ENCOUNTER — Telehealth: Payer: Self-pay | Admitting: *Deleted

## 2021-05-02 ENCOUNTER — Telehealth (INDEPENDENT_AMBULATORY_CARE_PROVIDER_SITE_OTHER): Payer: Self-pay | Admitting: Nurse Practitioner

## 2021-05-02 DIAGNOSIS — R0789 Other chest pain: Secondary | ICD-10-CM

## 2021-05-02 MED ORDER — GABAPENTIN 300 MG PO CAPS
300.0000 mg | ORAL_CAPSULE | Freq: Two times a day (BID) | ORAL | 3 refills | Status: DC
Start: 2021-05-02 — End: 2021-09-09
  Filled 2021-05-02: qty 60, 30d supply, fill #0
  Filled 2021-06-09: qty 60, 30d supply, fill #1
  Filled 2021-07-21: qty 60, 30d supply, fill #2
  Filled 2021-07-21: qty 60, 30d supply, fill #0

## 2021-05-02 NOTE — Patient Instructions (Signed)
Numbness Left side of body:   Keep upcoming appointment with neurology   Will order Neurontin      Irregular Heart Rhythm:   Please keep referral to cardiology     Follow up:   Follow up with Dr. Redmond Pulling if needed

## 2021-05-02 NOTE — Progress Notes (Signed)
Virtual Visit via Telephone Note  I connected with Laurie Bell on 05/02/21 at  1:40 PM EST by telephone and verified that I am speaking with the correct person using two identifiers.  Location: Patient: home Provider: office   I discussed the limitations, risks, security and privacy concerns of performing an evaluation and management service by telephone and the availability of in person appointments. I also discussed with the patient that there may be a patient responsible charge related to this service. The patient expressed understanding and agreed to proceed.   History of Present Illness:  Patient presents today for left-sided chest pain.  She has been seen for this several times in the past couple months.  She was recently prescribed diclofenac and muscle relaxer through the emergency department.  Since that time she has seen her GI specialist and was advised not to take these medications due to ulcers.  Referrals have been placed to cardiology and neurology.  Patient has been having ongoing issues with pain tingling and numbness to the entire right side of her body.  She states that most the time it is more painful around her left-sided chest region. Denies f/c/s, n/v/d, hemoptysis, PND, chest pain or edema.     Observations/Objective:  Vitals with BMI 04/28/2021 04/28/2021 04/28/2021  Height - - -  Weight - - -  BMI - - -  Systolic 785 885 97  Diastolic 86 71 66  Pulse 76 80 106      Assessment and Plan:  Numbness Left side of body:   Keep upcoming appointment with neurology   Will order Neurontin      Irregular Heart Rhythm:   Please keep referral to cardiology     Follow up:   Follow up with Dr. Redmond Pulling if needed    I discussed the assessment and treatment plan with the patient. The patient was provided an opportunity to ask questions and all were answered. The patient agreed with the plan and demonstrated an understanding of the instructions.   The  patient was advised to call back or seek an in-person evaluation if the symptoms worsen or if the condition fails to improve as anticipated.  I provided 23 minutes of non-face-to-face time during this encounter.   [name], NP

## 2021-05-02 NOTE — Telephone Encounter (Signed)
  Follow up Call-  Call back number 04/28/2021  Post procedure Call Back phone  # [phone]  Permission to leave phone message Yes  Some recent data might be hidden     Patient questions:  Do you have a fever, pain , or abdominal swelling? No. Pain Score  0 *  Have you tolerated food without any problems? Yes.    Have you been able to return to your normal activities? Yes.    Do you have any questions about your discharge instructions: Diet   No. Medications  No. Follow up visit  No.  Do you have questions or concerns about your Care? No.  Actions: * If pain score is 4 or above: No action needed, pain <4.  Have you developed a fever since your procedure? no  2.   Have you had an respiratory symptoms (SOB or cough) since your procedure? no  3.   Have you tested positive for COVID 19 since your procedure no  4.   Have you had any family members/close contacts diagnosed with the COVID 19 since your procedure?  no   If yes to any of these questions please route to Joylene John, RN and Joella Prince, RN

## 2021-05-03 ENCOUNTER — Telehealth: Payer: Self-pay | Admitting: Internal Medicine

## 2021-05-03 NOTE — Telephone Encounter (Signed)
Inbound call from Patient, requesting results from Endoscopy procedure. Please call back.

## 2021-05-03 NOTE — Telephone Encounter (Signed)
Results have not been reviewed by provider. No results to provide at this time.   HEALTHCARE LAWS AND MY CHART RESULTS:   Due to recent changes in healthcare laws, you may see results of your imaging and/or laboratory studies on MyChart before I have had a chance to review them.  I understand that in some cases there may be results that are confusing or concerning to you. Please understand that not all results are received at the same time and often I may need to interpret multiple results in order to provide you with the best plan of care or course of treatment. Therefore, I ask that you please allow me ample time to thoroughly review all your results before contacting my office for clarification.

## 2021-05-04 ENCOUNTER — Other Ambulatory Visit: Payer: Self-pay

## 2021-05-04 ENCOUNTER — Encounter: Payer: Self-pay | Admitting: Internal Medicine

## 2021-05-04 ENCOUNTER — Telehealth: Payer: Self-pay | Admitting: Internal Medicine

## 2021-05-04 NOTE — Telephone Encounter (Signed)
Inbound call from Patient. She states that she is losing weight and having really bad acid reflux. seeking advice.

## 2021-05-04 NOTE — Telephone Encounter (Signed)
Returned pt call to inquire further about her symptoms. States, since her procedure, she has not eaten any food, except grilled chicken. Fluid intake has also been poor as she is uncertain what she can eat/drink. Advised, if she is not taking in any food of substantial nutritional value or drinking any fluids, we would expect the outcome to result in weight loss. Advised I will send her a list of approved foods and beverages via My Chart. Pt confirms she is avoiding NSAIDs and taking Carafate and Pantoprazole as Rx'd. Advised she continue her medication regimen as this is most appropriate to treat her GERD at this time. Pt also wanted to know if we have the results of her pathology. Advised we are are that results are released to pts immediately and are confusing. However, her results have not been reviewed by the provider. Once reviewed, she will receive a call re: her results along with any recommendations. Verbalized acceptance and recommendations.

## 2021-05-09 ENCOUNTER — Telehealth: Payer: Self-pay | Admitting: Internal Medicine

## 2021-05-09 ENCOUNTER — Ambulatory Visit (INDEPENDENT_AMBULATORY_CARE_PROVIDER_SITE_OTHER): Payer: Self-pay | Admitting: Neurology

## 2021-05-09 ENCOUNTER — Other Ambulatory Visit: Payer: Self-pay

## 2021-05-09 ENCOUNTER — Encounter: Payer: Self-pay | Admitting: Neurology

## 2021-05-09 VITALS — BP 112/69 | HR 79 | Ht 67.0 in | Wt 184.0 lb

## 2021-05-09 DIAGNOSIS — R200 Anesthesia of skin: Secondary | ICD-10-CM

## 2021-05-09 DIAGNOSIS — R634 Abnormal weight loss: Secondary | ICD-10-CM

## 2021-05-09 DIAGNOSIS — R202 Paresthesia of skin: Secondary | ICD-10-CM

## 2021-05-09 DIAGNOSIS — R1310 Dysphagia, unspecified: Secondary | ICD-10-CM

## 2021-05-09 NOTE — Patient Instructions (Addendum)
MRI brain with and without contrast  We will call you with the results and let you know the next step
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
## 2021-05-09 NOTE — Telephone Encounter (Signed)
Patient called again stating she received the letter from Dr. Lorenso Courier but that it does not explain to her why she is still having trouble swallowing, why the medication she is taking does not work for her, and what she needs to do to remedy the problem.  She would like to speak to someone about this.  Thank you.

## 2021-05-09 NOTE — Telephone Encounter (Signed)
Patient called requesting the results of the EGD done on 11/17.  She also states she is still having trouble swallowing and the medication she was put on is not helping.  She needs to know what to do for it as she "can't keep going to the ER and missing work."  Please call patient and advise.  Thank you.

## 2021-05-10 ENCOUNTER — Other Ambulatory Visit: Payer: Self-pay

## 2021-05-10 ENCOUNTER — Other Ambulatory Visit (HOSPITAL_COMMUNITY): Payer: Self-pay

## 2021-05-10 DIAGNOSIS — R634 Abnormal weight loss: Secondary | ICD-10-CM

## 2021-05-10 DIAGNOSIS — R1310 Dysphagia, unspecified: Secondary | ICD-10-CM

## 2021-05-10 NOTE — Telephone Encounter (Signed)
Ambulatory referral to nutrition and diabetes in epic.  Ambulatory referral to GI in epic.  Patient has been scheduled for an esophageal manometry on Wednesday, 07/27/21 at 12:30 pm. Pt will need to arrive at [facility] by 12 pm. This is the next available appt.  Dr. Lorenso Courier, would you like to order modified barium swallow study prior to speech therapy referral? Please advise, thanks.

## 2021-05-10 NOTE — Telephone Encounter (Signed)
Called and spoke to the patient. She had an episode of difficulty swallowing this past week. Has also been losing weight, has lost about 7 lbs total since her trouble swallowing started. She has mostly been eating solid foods such as grilled chicken among other things. She has been taking her omeprazole BID and sucralfate QID as prescribed but is still having some difficulty with swallowing. I instructed that she follow aspiration precautions (chewing her foods fully, eating slowly, eating one full bite of food before starting another bite, staying upright when swallowing) to try to reduce her trouble swallowing. I also told her that she could try to eat softer foods and liquids to help keep her weight up. Nutritional supplements such as Ensure would be a good option for this.  Laurie Bell, I'm not sure if you are covering for Laurie Bell, but if you are, please place orders for esophageal manometry, referral to nutrition for weight loss, and referral to speech therapy. Thank you

## 2021-05-10 NOTE — Telephone Encounter (Signed)
Called and spoke with patient. She is upset because she received a letter with her results and not a call. Pt states that she has asked several times to speak with the doctor directly. Pt states that she is having difficulty swallowing and has lost weight. Pt also stated that she was following the recommended diet. Tried to explain to patient that the healing process will take several weeks, and also the repeat EGD is intended to recheck healing. Pt kept saying that we did not understand her symptoms. Pt advised if she can't eat or drink she will need to go to the ED, pt then reports that she is eating and drinking normally. Pt is frustrated and would like for you to call her directly to discuss her concerns. Thanks

## 2021-05-10 NOTE — Telephone Encounter (Signed)
Patient has been scheduled for a modified barium swallow study on Friday, 05/20/21 at 11 am at [facility]. Called and spoke with patient regarding all of her appts. Pt is aware that she will receive her esophageal manometry instructions in the mail and my chart. Pt reports that she has already been contacted by nutrition office. Pt verbalized understanding and had no concerns at the end of the call.

## 2021-05-10 NOTE — Telephone Encounter (Signed)
This has been addressed, see 05/09/21 telephone encounter.

## 2021-05-12 ENCOUNTER — Telehealth: Payer: Self-pay | Admitting: Family Medicine

## 2021-05-12 ENCOUNTER — Emergency Department (HOSPITAL_COMMUNITY)
Admission: EM | Admit: 2021-05-12 | Discharge: 2021-05-13 | Disposition: A | Discharge status: Home / Self Care | Payer: Self-pay | Attending: Emergency Medicine | Admitting: Emergency Medicine

## 2021-05-12 ENCOUNTER — Emergency Department (HOSPITAL_COMMUNITY): Payer: Self-pay

## 2021-05-12 ENCOUNTER — Other Ambulatory Visit: Payer: Self-pay

## 2021-05-12 DIAGNOSIS — Z91040 Latex allergy status: Secondary | ICD-10-CM | POA: Insufficient documentation

## 2021-05-12 DIAGNOSIS — Z20822 Contact with and (suspected) exposure to covid-19: Secondary | ICD-10-CM | POA: Insufficient documentation

## 2021-05-12 DIAGNOSIS — Z28310 Unvaccinated for covid-19: Secondary | ICD-10-CM | POA: Insufficient documentation

## 2021-05-12 DIAGNOSIS — M549 Dorsalgia, unspecified: Secondary | ICD-10-CM | POA: Insufficient documentation

## 2021-05-12 DIAGNOSIS — M25512 Pain in left shoulder: Secondary | ICD-10-CM | POA: Insufficient documentation

## 2021-05-12 DIAGNOSIS — B349 Viral infection, unspecified: Secondary | ICD-10-CM | POA: Insufficient documentation

## 2021-05-12 LAB — CBC WITH DIFFERENTIAL/PLATELET
Abs Immature Granulocytes: 0.02 10*3/uL (ref 0.00–0.07)
Basophils Absolute: 0.1 10*3/uL (ref 0.0–0.1)
Basophils Relative: 1 %
Eosinophils Absolute: 0.3 10*3/uL (ref 0.0–0.5)
Eosinophils Relative: 3 %
HCT: 40.2 % (ref 36.0–46.0)
Hemoglobin: 13.4 g/dL (ref 12.0–15.0)
Immature Granulocytes: 0 %
Lymphocytes Relative: 36 %
Lymphs Abs: 3.6 10*3/uL (ref 0.7–4.0)
MCH: 28.8 pg (ref 26.0–34.0)
MCHC: 33.3 g/dL (ref 30.0–36.0)
MCV: 86.5 fL (ref 80.0–100.0)
Monocytes Absolute: 0.7 10*3/uL (ref 0.1–1.0)
Monocytes Relative: 7 %
Neutro Abs: 5.3 10*3/uL (ref 1.7–7.7)
Neutrophils Relative %: 53 %
Platelets: 248 10*3/uL (ref 150–400)
RBC: 4.65 MIL/uL (ref 3.87–5.11)
RDW: 12.6 % (ref 11.5–15.5)
WBC: 9.9 10*3/uL (ref 4.0–10.5)
nRBC: 0 % (ref 0.0–0.2)

## 2021-05-12 LAB — I-STAT BETA HCG BLOOD, ED (MC, WL, AP ONLY): I-stat hCG, quantitative: <5 m[IU]/mL (ref <5)

## 2021-05-12 LAB — COMPREHENSIVE METABOLIC PANEL
ALT: 19 U/L (ref 0–44)
AST: 19 U/L (ref 15–41)
Albumin: 4.2 g/dL (ref 3.5–5.0)
Alkaline Phosphatase: 96 U/L (ref 38–126)
Anion gap: 6 (ref 5–15)
BUN: 11 mg/dL (ref 6–20)
CO2: 25 mmol/L (ref 22–32)
Calcium: 9.1 mg/dL (ref 8.9–10.3)
Chloride: 102 mmol/L (ref 98–111)
Creatinine, Ser: 0.84 mg/dL (ref 0.44–1.00)
GFR, Estimated: >60 mL/min (ref >60)
Glucose, Bld: 95 mg/dL (ref 70–99)
Potassium: 3.6 mmol/L (ref 3.5–5.1)
Sodium: 133 mmol/L — ABNORMAL LOW (ref 135–145)
Total Bilirubin: 0.6 mg/dL (ref 0.3–1.2)
Total Protein: 8.1 g/dL (ref 6.5–8.1)

## 2021-05-12 LAB — TROPONIN I (HIGH SENSITIVITY): Troponin I (High Sensitivity): <2 ng/L (ref <18)

## 2021-05-12 NOTE — ED Triage Notes (Signed)
Patient report generalize body aches and left shoulder pain radiating to her neck. Pt c/o Nausea and diarrhea x1 day but denies emesis. Pt denies fever. Pt reports endoscopy done 04/28/21. Pt a/ox4.

## 2021-05-12 NOTE — Telephone Encounter (Signed)
Wants to confirm what testing was done thyroid testing? She's having swallowing and has lost weight fast.

## 2021-05-13 ENCOUNTER — Telehealth: Payer: Self-pay | Admitting: Gastroenterology

## 2021-05-13 ENCOUNTER — Other Ambulatory Visit: Payer: Self-pay

## 2021-05-13 LAB — RESP PANEL BY RT-PCR (FLU A&B, COVID) ARPGX2
Influenza A by PCR: NEGATIVE
Influenza B by PCR: NEGATIVE
SARS Coronavirus 2 by RT PCR: NEGATIVE

## 2021-05-13 LAB — TROPONIN I (HIGH SENSITIVITY): Troponin I (High Sensitivity): <2 ng/L (ref <18)

## 2021-05-13 MED ORDER — KETOROLAC TROMETHAMINE 15 MG/ML IJ SOLN
15.0000 mg | Freq: Once | INTRAMUSCULAR | Status: AC
  Administered 2021-05-13: 15 mg via INTRAVENOUS
  Filled 2021-05-13: qty 1

## 2021-05-13 MED ORDER — ONDANSETRON HCL 4 MG/2ML IJ SOLN
4.0000 mg | Freq: Once | INTRAMUSCULAR | Status: AC
  Administered 2021-05-13: 4 mg via INTRAVENOUS
  Filled 2021-05-13: qty 2

## 2021-05-13 MED ORDER — LIDOCAINE VISCOUS HCL 2 % MT SOLN
15.0000 mL | Freq: Once | OROMUCOSAL | Status: AC
  Administered 2021-05-13: 15 mL via ORAL
  Filled 2021-05-13: qty 15

## 2021-05-13 MED ORDER — ALUM & MAG HYDROXIDE-SIMETH 200-200-20 MG/5ML PO SUSP
30.0000 mL | Freq: Once | ORAL | Status: AC
  Administered 2021-05-13: 30 mL via ORAL
  Filled 2021-05-13: qty 30

## 2021-05-13 MED ORDER — ONDANSETRON 4 MG PO TBDP
4.0000 mg | ORAL_TABLET | Freq: Three times a day (TID) | ORAL | 0 refills | Status: DC | PRN
Start: 2021-05-13 — End: 2021-08-18
  Filled 2021-05-13: qty 20, 7d supply, fill #0

## 2021-05-13 MED ORDER — SODIUM CHLORIDE 0.9 % IV BOLUS
1000.0000 mL | Freq: Once | INTRAVENOUS | Status: AC
  Administered 2021-05-13: 1000 mL via INTRAVENOUS

## 2021-05-13 NOTE — Discharge Instructions (Signed)
You likely have a viral illness.  This should be treated symptomatically. Use Tylenol as needed for chest pain or body aches. Use zofran as needed for nausea and vomiting.  Make sure you continue taking your stomach ulcer medication as prescribed. Make sure you stay well-hydrated with water. Wash your hands frequently to prevent spread of infection. Follow-up with your primary care doctor in 1 week if your symptoms are not improving. Return to the emergency room if you develop difficulty breathing, persistent vomiting, blood in your stool or vomit, or with any new or worsening symptoms.

## 2021-05-13 NOTE — Telephone Encounter (Signed)
Patient has and upcoming appointment. 05/18/2021

## 2021-05-13 NOTE — Telephone Encounter (Signed)
Patient called asking what kind of foods she can eat for breakfast.  She does not eat oatmeal, so she is asking for other suggestions.  Please call.  Thank you.

## 2021-05-13 NOTE — Telephone Encounter (Signed)
Returned pt call. Advised of some alternative food options that will provide her with added nutritional value while helping to reduce/resolve inflammatory symptoms. Expressed appreciation and understanding of information provided.

## 2021-05-13 NOTE — ED Provider Notes (Signed)
[facility] DEPT Provider Note   CSN: [id number] Arrival date & time: 05/12/21  1910     History Chief Complaint  Patient presents with   Generalized Body Aches    Laurie Bell is a 40 y.o. female presenting for evaluation of generalized body aches, nausea, vomiting, chest pain.  Patient states she has had nausea and vomiting for approximately 5 days.  Today she developed generalized body aches.  She also reports left chest, shoulder, and back pain which began with nausea and vomiting.  She is having difficulty tolerating p.o. due to her symptoms.  She denies fever, sore throat, cough, shortness of breath.  She does have some mild nasal congestion as well as diarrhea.  No urinary symptoms.  She denies sick contacts.  She is not vaccinated for COVID or flu.  She recently had an EGD, but had no chest pain in the first 2 weeks following this.  Her EGD diagnosed her with gastric ulcers and GERD.  She has been on Carafate and Protonix for this.  She takes no other medicines daily.  HPI     Past Medical History:  Diagnosis Date   Anxiety    Chlamydia contact, treated    GERD (gastroesophageal reflux disease)     Patient Active Problem List   Diagnosis Date Noted   Irregular heart rhythm 04/15/2021   Numbness on left side 04/15/2021   Dysphagia 03/17/2021   Hypomagnesemia 02/23/2021   Gastroesophageal reflux disease    Hypokalemia 08/24/2020   Atypical chest pain 08/24/2020   Nausea and vomiting 08/24/2020   Hypocalcemia 08/24/2020   DUB (dysfunctional uterine bleeding) 01/12/2017   Fibroid, uterine 01/12/2017    Past Surgical History:  Procedure Laterality Date   ENDOMETRIAL ABLATION N/A 04/25/2017   Procedure: ENDOMETRIAL ABLATION With NOVASURE;  Surgeon: Woodroe Mode, MD;  Location: [facility];  Service: Gynecology;  Laterality: N/A;   TUBAL LIGATION       OB History     Gravida  2   Para  2   Term  2    Preterm  0   AB  0   Living  2      SAB  0   IAB  0   Ectopic  0   Multiple  0   Live Births              Family History  Problem Relation Age of Onset   Hypertension Mother    Hypertension Maternal Aunt    Cancer Maternal Grandmother        type unknown    Social History   Tobacco Use   Smoking status: Never   Smokeless tobacco: Never  Vaping Use   Vaping Use: Never used  Substance Use Topics   Alcohol use: No   Drug use: Not Currently    Types: Marijuana    Comment: quit 2018    Home Medications Prior to Admission medications   Medication Sig Start Date End Date Taking? Authorizing Provider  ondansetron (ZOFRAN-ODT) 4 MG disintegrating tablet Take 1 tablet (4 mg total) by mouth every 8 (eight) hours as needed for nausea or vomiting. 05/13/21  Yes Markies Mowatt, PA-C  acetaminophen (TYLENOL) 500 MG tablet Take 1,000 mg by mouth every 6 (six) hours as needed for mild pain.    [provider]  gabapentin (NEURONTIN) 300 MG capsule Take 1 capsule (300 mg total) by mouth 2 (two) times daily. 05/02/21   Nils Pyle,  Kriste Basque, NP  hydrOXYzine (ATARAX/VISTARIL) 10 MG tablet Take 1 tablet (10 mg total) by mouth 3 (three) times daily as needed for anxiety. 02/22/21   Mayers, Cari S, PA-C  pantoprazole (PROTONIX) 40 MG tablet Take 1 tablet (40 mg total) by mouth 2 (two) times daily before a meal. 04/28/21   Sharyn Creamer, MD  PARoxetine (PAXIL) 20 MG tablet Take 1 tablet (20 mg total) by mouth daily. 04/20/21   Dorna Mai, MD  sucralfate (CARAFATE) 1 GM/10ML suspension Take 10 mLs (1 g total) by mouth 4 (four) times daily -  with meals and at bedtime. 04/29/21   Sharyn Creamer, MD  medroxyPROGESTERone (PROVERA) 10 MG tablet Take 2 tablets by mouth daily Patient not taking: Reported on 06/05/2019 02/06/18 02/02/20  Sloan Leiter, MD    Allergies    Latex  Review of Systems   Review of Systems  HENT:  Positive for congestion.   Cardiovascular:  Positive  for chest pain.  Gastrointestinal:  Positive for diarrhea, nausea and vomiting.  All other systems reviewed and are negative.  Physical Exam Updated Vital Signs BP 106/74   Pulse 71   Temp 98.8 F (37.1 C) (Oral)   Resp 20   Ht 5\' 7"  (1.702 m)   Wt 80.7 kg   LMP 04/16/2021   SpO2 96%   BMI 27.88 kg/m   Physical Exam Vitals and nursing note reviewed.  Constitutional:      General: She is not in acute distress.    Appearance: Normal appearance.     Comments: Resting in the chair in NAD  HENT:     Head: Normocephalic and atraumatic.     Nose: Rhinorrhea present. Rhinorrhea is clear.     Mouth/Throat:     Mouth: Mucous membranes are moist.     Pharynx: Oropharynx is clear. Uvula midline. No posterior oropharyngeal erythema.     Tonsils: No tonsillar exudate.  Eyes:     Conjunctiva/sclera: Conjunctivae normal.     Pupils: Pupils are equal, round, and reactive to light.  Cardiovascular:     Rate and Rhythm: Normal rate and regular rhythm.     Pulses: Normal pulses.  Pulmonary:     Effort: Pulmonary effort is normal. No respiratory distress.     Breath sounds: Normal breath sounds. No wheezing.     Comments: Speaking in full sentences.  Clear lung sounds in all fields. Diffuse tenderness palpation of the chest wall both anterior and posteriorly, worse on the left side.  No skin changes.  No masses, contusions, or lesions Chest:     Chest wall: Tenderness present.  Abdominal:     General: There is no distension.     Palpations: Abdomen is soft. There is no mass.     Tenderness: There is no abdominal tenderness. There is no guarding or rebound.  Musculoskeletal:        General: Normal range of motion.     Cervical back: Normal range of motion and neck supple.     Right lower leg: No edema.     Left lower leg: No edema.  Skin:    General: Skin is warm and dry.     Capillary Refill: Capillary refill takes less than 2 seconds.  Neurological:     Mental Status: She is  alert and oriented to person, place, and time.  Psychiatric:        Mood and Affect: Mood and affect normal.  Speech: Speech normal.        Behavior: Behavior normal.    ED Results / Procedures / Treatments   Labs (all labs ordered are listed, but only abnormal results are displayed) Labs Reviewed  COMPREHENSIVE METABOLIC PANEL - Abnormal; Notable for the following components:      Result Value   Sodium 133 (*)    All other components within normal limits  RESP PANEL BY RT-PCR (FLU A&B, COVID) ARPGX2  CBC WITH DIFFERENTIAL/PLATELET  I-STAT BETA HCG BLOOD, ED ([facility], [facility], [facility] ONLY)  TROPONIN I (HIGH SENSITIVITY)  TROPONIN I (HIGH SENSITIVITY)    EKG EKG Interpretation  Date/Time:  Friday May 13 2021 03:27:55 EST Ventricular Rate:  69 PR Interval:  189 QRS Duration: 93 QT Interval:  422 QTC Calculation: 453 R Axis:   58 Text Interpretation: Sinus rhythm Ventricular trigeminy Anterior infarct, old No significant change was found Confirmed by Shanon Rosser [id number]) on 05/13/2021 3:47:36 AM  Radiology DG Chest 2 View  Result Date: 05/12/2021 CLINICAL DATA:  Chest pain EXAM: CHEST - 2 VIEW COMPARISON:  04/16/2021 FINDINGS: The heart size and mediastinal contours are within normal limits. Both lungs are clear. The visualized skeletal structures are unremarkable. IMPRESSION: No active cardiopulmonary disease. Electronically Signed   By: Donavan Foil M.D.   On: 05/12/2021 23:05    Procedures Procedures   Medications Ordered in ED Medications  ondansetron (ZOFRAN) injection 4 mg (4 mg Intravenous Given 05/13/21 0322)  sodium chloride 0.9 % bolus 1,000 mL (0 mLs Intravenous Stopped 05/13/21 0503)  alum & mag hydroxide-simeth (MAALOX/MYLANTA) 200-200-20 MG/5ML suspension 30 mL (30 mLs Oral Given 05/13/21 0322)    And  lidocaine (XYLOCAINE) 2 % viscous mouth solution 15 mL (15 mLs Oral Given 05/13/21 0331)  ketorolac (TORADOL) 15 MG/ML injection 15 mg (15 mg Intravenous Given  05/13/21 0322)    ED Course  I have reviewed the triage vital signs and the nursing notes.  Pertinent labs & imaging results that were available during my care of the patient were reviewed by me and considered in my medical decision making (see chart for details).    MDM Rules/Calculators/A&P                           Patient presenting for evaluation nausea, vomiting, generalized body aches.  On exam, patient appears nontoxic.  Vital signs are reassuring.  She has tenderness palpation of the chest wall, and with pain beginning with vomiting, likely MSK.  Labs obtained from triage interpreted by me, overall reassuring.  No leukocytosis.  Electrolytes stable.  Kidney and liver function is normal.  As such, likely viral illness.  There is likely also component of muscle soreness due to vomiting.  Doubt sequela from edg, as sxs began ~2 wks after procedure. Cxr viewed and independently interpreted by me, no sign of infection, free air, pneumothorax, or acute bony abnormality.  Will test for COVID and flu, treat symptomatically, and reassess.  COVID and flu negative.  On reevaluation after symptomatic treatment, patient reports improvement of symptoms.  I discussed continued symptomatic management at home, follow-up with PCP.  At this time, patient appears safe for discharge.  Return precautions given.  Patient states she understands and agrees to plan.  Final Clinical Impression(s) / ED Diagnoses Final diagnoses:  Viral illness    Rx / DC Orders ED Discharge Orders          Ordered    ondansetron (ZOFRAN-ODT)  4 MG disintegrating tablet  Every 8 hours PRN        05/13/21 0546             Franchot Heidelberg, PA-C 05/13/21 0559    Molpus, Jenny Reichmann, MD 05/13/21 [phone]

## 2021-05-17 ENCOUNTER — Other Ambulatory Visit: Payer: Self-pay

## 2021-05-17 ENCOUNTER — Ambulatory Visit (INDEPENDENT_AMBULATORY_CARE_PROVIDER_SITE_OTHER): Payer: Self-pay | Admitting: Interventional Cardiology

## 2021-05-17 ENCOUNTER — Encounter (HOSPITAL_COMMUNITY): Payer: Self-pay

## 2021-05-17 ENCOUNTER — Telehealth: Payer: Self-pay | Admitting: Family Medicine

## 2021-05-17 ENCOUNTER — Ambulatory Visit (HOSPITAL_COMMUNITY): Payer: Self-pay

## 2021-05-17 ENCOUNTER — Encounter: Payer: Self-pay | Admitting: Interventional Cardiology

## 2021-05-17 VITALS — BP 120/80 | HR 92 | Ht 67.0 in | Wt 182.0 lb

## 2021-05-17 DIAGNOSIS — R002 Palpitations: Secondary | ICD-10-CM

## 2021-05-17 DIAGNOSIS — F419 Anxiety disorder, unspecified: Secondary | ICD-10-CM

## 2021-05-17 DIAGNOSIS — I493 Ventricular premature depolarization: Secondary | ICD-10-CM

## 2021-05-17 DIAGNOSIS — R0789 Other chest pain: Secondary | ICD-10-CM

## 2021-05-17 NOTE — Progress Notes (Signed)
Cardiology Office Note   Date:  05/17/2021   ID:  Laurie Bell, DOB 1980/11/26, MRN [id number]  PCP:  Dorna Mai, MD    No chief complaint on file.  palpitations  Wt Readings from Last 3 Encounters:  05/17/21 182 lb (82.6 kg)  05/12/21 178 lb (80.7 kg)  05/09/21 184 lb (83.5 kg)       History of Present Illness: Laurie Bell is a 40 y.o. female  with palpitations, referred by Dr. Redmond Pulling.   Records show: "Upon exam in office today patient's heart rhythm is irregular.  EKG showed multiple PVCs.  Patient does have a history of hypokalemia.  We will check labs today.  We will place referral to cardiology.  Patient does have a history of a fall 2 years ago and has been having intermittent numbness and pain to the left side of her body since the fall.  We will place referral to neurology as well. "ECho in 08/2020 showed: "Left ventricular ejection fraction, by estimation, is 65 to 70%. The  left ventricle has normal function. The left ventricle has no regional  wall motion abnormalities. Left ventricular diastolic parameters were  normal.   2. Right ventricular systolic function is normal. The right ventricular  size is normal.   3. The mitral valve is normal in structure. Trivial mitral valve  regurgitation.   4. The aortic valve is normal in structure. Aortic valve regurgitation is  not visualized. "  Patient reports occasional irregular heart beats.  Did not feel sx in the office the day of the ECG.  Denies : Dizziness. Leg edema. Nitroglycerin use. Orthopnea.  Paroxysmal nocturnal dyspnea. Shortness of breath. Syncope.    Has chest pain not related to exertion.  It is on the left side of her chest.  Esophageal manometry is planned.  Negative cardiac work-up on several occasions in the emergency room. Past Medical History:  Diagnosis Date   Anxiety    Chlamydia contact, treated    GERD (gastroesophageal reflux disease)     Past Surgical History:   Procedure Laterality Date   ENDOMETRIAL ABLATION N/A 04/25/2017   Procedure: ENDOMETRIAL ABLATION With NOVASURE;  Surgeon: Woodroe Mode, MD;  Location: [facility];  Service: Gynecology;  Laterality: N/A;   TUBAL LIGATION       Current Outpatient Medications  Medication Sig Dispense Refill   acetaminophen (TYLENOL) 500 MG tablet Take 1,000 mg by mouth every 6 (six) hours as needed for mild pain.     gabapentin (NEURONTIN) 300 MG capsule Take 1 capsule (300 mg total) by mouth 2 (two) times daily. 90 capsule 3   hydrOXYzine (ATARAX/VISTARIL) 10 MG tablet Take 1 tablet (10 mg total) by mouth 3 (three) times daily as needed for anxiety. 60 tablet 0   ondansetron (ZOFRAN-ODT) 4 MG disintegrating tablet Take 1 tablet (4 mg total) by mouth every 8 (eight) hours as needed for nausea or vomiting. 20 tablet 0   pantoprazole (PROTONIX) 40 MG tablet Take 1 tablet (40 mg total) by mouth 2 (two) times daily before a meal. 120 tablet 1   PARoxetine (PAXIL) 20 MG tablet Take 1 tablet (20 mg total) by mouth daily. 30 tablet 0   sucralfate (CARAFATE) 1 GM/10ML suspension Take 10 mLs (1 g total) by mouth 4 (four) times daily -  with meals and at bedtime. 1200 mL 2   No current facility-administered medications for this visit.    Allergies:   Latex  Social History:  The patient  reports that she has never smoked. She has never used smokeless tobacco. She reports that she does not currently use drugs after having used the following drugs: Marijuana. She reports that she does not drink alcohol.   Family History:  The patient's family history includes Cancer in her maternal grandmother; Hypertension in her maternal aunt and mother.    ROS:  Please see the history of present illness.   Otherwise, review of systems are positive for anxiety/chest pain.   All other systems are reviewed and negative.    PHYSICAL EXAM: VS:  BP 120/80   Pulse 92   Ht 5\' 7"  (1.702 m)   Wt 182 lb (82.6 kg)    SpO2 95%   BMI 28.51 kg/m  , BMI Body mass index is 28.51 kg/m. GEN: Well nourished, well developed, in no acute distress HEENT: normal Neck: no JVD, carotid bruits, or masses Cardiac: RRR; no murmurs, rubs, or gallops,no edema  Respiratory:  clear to auscultation bilaterally, normal work of breathing GI: soft, nontender, nondistended, + BS MS: no deformity or atrophy Skin: warm and dry, no rash Neuro:  Strength and sensation are intact Psych: euthymic mood, full affect   EKG:   The ekg ordered today demonstrates NSR, no ST changes   Recent Labs: 02/22/2021: Magnesium 2.0 05/12/2021: ALT 19; BUN 11; Creatinine, Ser 0.84; Hemoglobin 13.4; Platelets 248; Potassium 3.6; Sodium 133   Lipid Panel No results found for: CHOL, TRIG, HDL, CHOLHDL, VLDL, LDLCALC, LDLDIRECT   Other studies Reviewed: Additional studies/ records that were reviewed today with results demonstrating: hospital labs reviewed.  Normal LVEF in 08/2020.    ASSESSMENT AND PLAN:  PVCs: Noted on prior ECG.  Multiple people in the family with thyroid problems.  Will check TSH today. I explained to her that these are benign. Anxiety/panic attack: Can feel racing heart when she has a panic attack.  On meds per PMD.  Chest pain: atypical.  Related to certain foods.  Not related to exertion.  Multiple negative troponins.  Normal echo.  If symptoms persist, we could consider further testing with a coronary CTA but at this time, I think this is unlikely to be related to  heart.  GI w/u in progress.    Current medicines are reviewed at length with the patient today.  The patient concerns regarding her medicines were addressed.  The following changes have been made:  No change  Labs/ tests ordered today include:  No orders of the defined types were placed in this encounter.   Recommend 150 minutes/week of aerobic exercise Low fat, low carb, high fiber diet recommended  Disposition:   FU if sx persist or worsen, could  consider ischemic testing but I think it would be low yield based on her sx   Signed, Larae Grooms, MD  05/17/2021 4:19 PM    [facility] [place], [place], [place]  [address] Phone: [phone]; Fax: [phone]

## 2021-05-17 NOTE — Telephone Encounter (Signed)
Pt was sent a letter from financial dept. Inform them, that the application they submitted was incomplete, since they were missing some documentation at the time of the appointment, Pt need to reschedule and resubmit all new papers and application for CAFA and [place], P.S. old documents has been sent back by mail to the Pt and Pt. need to make a new appt. 

## 2021-05-17 NOTE — Patient Instructions (Signed)
Medication Instructions:  Your physician recommends that you continue on your current medications as directed. Please refer to the Current Medication list given to you today.  *If you need a refill on your cardiac medications before your next appointment, please call your pharmacy*   Lab Work: Lab work to be done today-TSH If you have labs (blood work) drawn today and your tests are completely normal, you will receive your results only by: [place] (if you have MyChart) OR A paper copy in the mail If you have any lab test that is abnormal or we need to change your treatment, we will call you to review the results.   Testing/Procedures: none   Follow-Up: At [facility], you and your health needs are our priority.  As part of our continuing mission to provide you with exceptional heart care, we have created designated Provider Care Teams.  These Care Teams include your primary Cardiologist (physician) and Advanced Practice Providers (APPs -  Physician Assistants and Nurse Practitioners) who all work together to provide you with the care you need, when you need it.  We recommend signing up for the patient portal called "MyChart".  Sign up information is provided on this After Visit Summary.  MyChart is used to connect with patients for Virtual Visits (Telemedicine).  Patients are able to view lab/test results, encounter notes, upcoming appointments, etc.  Non-urgent messages can be sent to your provider as well.   To learn more about what you can do with MyChart, go to [email or web address].    Your next appointment:   As needed  The format for your next appointment:   In Person  Provider:   Larae Grooms, MD     Other Instructions

## 2021-05-18 ENCOUNTER — Encounter: Payer: Self-pay | Admitting: Family Medicine

## 2021-05-18 ENCOUNTER — Ambulatory Visit (INDEPENDENT_AMBULATORY_CARE_PROVIDER_SITE_OTHER): Payer: Self-pay | Admitting: Family Medicine

## 2021-05-18 VITALS — BP 109/76 | HR 107 | Temp 98.5°F | Resp 16 | Wt 178.0 lb

## 2021-05-18 DIAGNOSIS — R7989 Other specified abnormal findings of blood chemistry: Secondary | ICD-10-CM

## 2021-05-18 DIAGNOSIS — F419 Anxiety disorder, unspecified: Secondary | ICD-10-CM

## 2021-05-18 LAB — TSH: TSH: 0.207 u[IU]/mL — ABNORMAL LOW (ref 0.450–4.500)

## 2021-05-18 NOTE — Progress Notes (Signed)
Patient is here for her follow-up report from cardiologist. patient is still very concern about  her thyroid Patient would like to talk about here pain that is on her left under arm around breat area  Patient said gabapentin is not helping her pain.

## 2021-05-19 ENCOUNTER — Other Ambulatory Visit: Payer: Self-pay

## 2021-05-19 LAB — T3, FREE: T3, Free: 3.3 pg/mL (ref 2.0–4.4)

## 2021-05-19 LAB — T4, FREE: Free T4: 1.29 ng/dL (ref 0.82–1.77)

## 2021-05-19 MED ORDER — BENZONATATE 100 MG PO CAPS
ORAL_CAPSULE | ORAL | 0 refills | Status: DC
Start: 2021-05-19 — End: 2021-08-18
  Filled 2021-05-19: qty 30, 10d supply, fill #0

## 2021-05-19 NOTE — Telephone Encounter (Signed)
Offered for pt to be seen sooner than 12/14 @ 130pm but pt declined all availabilities. Scheduled to be seen by Nicoletta Ba, PA on 05/25/21 @ 130pm for wt loss and gastric ulcers.

## 2021-05-19 NOTE — Telephone Encounter (Signed)
Inbound call from patient states if chicken noddle soup could cause an ulcer or trigger her reflux, then what food/soup can she eat?

## 2021-05-19 NOTE — Telephone Encounter (Signed)
I have spoken to this pt at length to provide her with food alternatives that will help reduce symptoms of reflux and abd discomfort. Pt was also provided with literature to help with her reflux symptoms and dietary habits. Given her dietary struggles, pt has lost a fair amount of weight, although questionable accuracy of scales given fluctuation of wt ([facility] 12/6 = 182, PCP 12/7 = 178, ED 12/1 = 178, [facility] 11/28 = 184, [facility] 11/17 = 187, [facility] 10/6 = 187). Called [facility] to determine if appt could be moved up but there were no available dates BEFORE 06/28/21. [facility] placed pt on a move up list and marked as URGENT. Routing this message to Dr. Lorenso Courier to determine if she has any suggestions or if she would prefer pt to be seen as Urgent appt with PA. Will await her response.

## 2021-05-20 ENCOUNTER — Ambulatory Visit (HOSPITAL_COMMUNITY): Payer: Self-pay

## 2021-05-20 ENCOUNTER — Encounter (HOSPITAL_COMMUNITY): Payer: Self-pay

## 2021-05-20 NOTE — Telephone Encounter (Signed)
Patient called in and was concern about her labs. I gave patient lab results advice that was advise through provider. Patient was very concerned as to why see has been losing weight and know one seems to care.    Patient would like to talk with provider about her lab results

## 2021-05-23 ENCOUNTER — Encounter: Payer: Self-pay | Admitting: Family Medicine

## 2021-05-23 NOTE — Progress Notes (Signed)
Established Patient Office Visit  Subjective:  Patient ID: Laurie Bell, female    DOB: Feb 27, 1981  Age: 40 y.o. MRN: [id number]  CC:  Chief Complaint  Patient presents with   Follow-up    HPI [facility] presents for follow up of abnormal TSH noted on recent labs.  Past Medical History:  Diagnosis Date   Anxiety    Chlamydia contact, treated    GERD (gastroesophageal reflux disease)     Past Surgical History:  Procedure Laterality Date   ENDOMETRIAL ABLATION N/A 04/25/2017   Procedure: ENDOMETRIAL ABLATION With NOVASURE;  Surgeon: Woodroe Mode, MD;  Location: [place];  Service: Gynecology;  Laterality: N/A;   TUBAL LIGATION      Family History  Problem Relation Age of Onset   Hypertension Mother    Hypertension Maternal Aunt    Cancer Maternal Grandmother        type unknown    Social History   Socioeconomic History   Marital status: Single    Spouse name: Not on file   Number of children: 2   Years of education: Not on file   Highest education level: Not on file  Occupational History   Occupation: Best boy: [employer]  Tobacco Use   Smoking status: Never   Smokeless tobacco: Never  Vaping Use   Vaping Use: Never used  Substance and Sexual Activity   Alcohol use: No   Drug use: Not Currently    Types: Marijuana    Comment: quit 2018   Sexual activity: Not Currently    Birth control/protection: Surgical  Other Topics Concern   Not on file  Social History Narrative   Right Handed    Lives in a one story home   Social Determinants of Health   Financial Resource Strain: Not on file  Food Insecurity: Not on file  Transportation Needs: Not on file  Physical Activity: Not on file  Stress: Not on file  Social Connections: Not on file  Intimate Partner Violence: Not on file    ROS Review of Systems  Psychiatric/Behavioral:  Negative for sleep disturbance. The patient is nervous/anxious.   All other  systems reviewed and are negative.  Objective:   Today's Vitals: BP 109/76   Pulse (!) 107   Temp 98.5 F (36.9 C) (Oral)   Resp 16   Wt 178 lb (80.7 kg)   SpO2 96%   BMI 27.88 kg/m   Physical Exam Vitals and nursing note reviewed.  Constitutional:      General: She is not in acute distress. Neck:     Thyroid: No thyromegaly.  Cardiovascular:     Rate and Rhythm: Normal rate and regular rhythm.  Pulmonary:     Effort: Pulmonary effort is normal.     Breath sounds: Normal breath sounds.  Musculoskeletal:     Cervical back: Normal range of motion and neck supple.  Neurological:     General: No focal deficit present.     Mental Status: She is alert and oriented to person, place, and time.  Psychiatric:        Mood and Affect: Affect normal. Mood is anxious.        Behavior: Behavior normal.    Assessment & Plan:   1. Abnormal TSH Abnormal TSH - additional monitoring labs ordered - T4, Free - T3, Free  2. Anxiety Continue present management    Outpatient Encounter Medications as of 05/18/2021  Medication  Sig   acetaminophen (TYLENOL) 500 MG tablet Take 1,000 mg by mouth every 6 (six) hours as needed for mild pain.   gabapentin (NEURONTIN) 300 MG capsule Take 1 capsule (300 mg total) by mouth 2 (two) times daily.   hydrOXYzine (ATARAX/VISTARIL) 10 MG tablet Take 1 tablet (10 mg total) by mouth 3 (three) times daily as needed for anxiety.   ondansetron (ZOFRAN-ODT) 4 MG disintegrating tablet Take 1 tablet (4 mg total) by mouth every 8 (eight) hours as needed for nausea or vomiting.   pantoprazole (PROTONIX) 40 MG tablet Take 1 tablet (40 mg total) by mouth 2 (two) times daily before a meal.   PARoxetine (PAXIL) 20 MG tablet Take 1 tablet (20 mg total) by mouth daily.   sucralfate (CARAFATE) 1 GM/10ML suspension Take 10 mLs (1 g total) by mouth 4 (four) times daily -  with meals and at bedtime.   [DISCONTINUED] medroxyPROGESTERone (PROVERA) 10 MG tablet Take 2 tablets  by mouth daily (Patient not taking: Reported on 06/05/2019)   No facility-administered encounter medications on file as of 05/18/2021.    Follow-up: No follow-ups on file.   Becky Sax, MD

## 2021-05-24 ENCOUNTER — Other Ambulatory Visit: Payer: Self-pay | Admitting: Family Medicine

## 2021-05-24 DIAGNOSIS — R7989 Other specified abnormal findings of blood chemistry: Secondary | ICD-10-CM

## 2021-05-25 ENCOUNTER — Telehealth: Payer: Self-pay | Admitting: Family Medicine

## 2021-05-25 ENCOUNTER — Other Ambulatory Visit: Payer: Self-pay

## 2021-05-25 ENCOUNTER — Encounter: Payer: Self-pay | Admitting: Physician Assistant

## 2021-05-25 ENCOUNTER — Other Ambulatory Visit: Payer: Self-pay | Admitting: Family Medicine

## 2021-05-25 ENCOUNTER — Ambulatory Visit (INDEPENDENT_AMBULATORY_CARE_PROVIDER_SITE_OTHER): Payer: Self-pay | Admitting: Physician Assistant

## 2021-05-25 VITALS — BP 104/70 | HR 90 | Ht 67.0 in | Wt 179.0 lb

## 2021-05-25 DIAGNOSIS — K219 Gastro-esophageal reflux disease without esophagitis: Secondary | ICD-10-CM

## 2021-05-25 DIAGNOSIS — R1310 Dysphagia, unspecified: Secondary | ICD-10-CM

## 2021-05-25 DIAGNOSIS — K259 Gastric ulcer, unspecified as acute or chronic, without hemorrhage or perforation: Secondary | ICD-10-CM

## 2021-05-25 DIAGNOSIS — F419 Anxiety disorder, unspecified: Secondary | ICD-10-CM

## 2021-05-25 MED ORDER — SUCRALFATE 1 GM/10ML PO SUSP
1.0000 g | Freq: Three times a day (TID) | ORAL | 2 refills | Status: DC
Start: 2021-05-25 — End: 2021-09-15
  Filled 2021-05-25: qty 1200, 30d supply, fill #0

## 2021-05-25 MED ORDER — PANTOPRAZOLE SODIUM 40 MG PO TBEC: 40.0000 mg | DELAYED_RELEASE_TABLET | Freq: Two times a day (BID) | ORAL | 3 refills | Status: DC

## 2021-05-25 MED ORDER — HYOSCYAMINE SULFATE 0.125 MG SL SUBL
0.1250 mg | SUBLINGUAL_TABLET | Freq: Four times a day (QID) | SUBLINGUAL | 0 refills | Status: DC | PRN
  Filled 2021-05-25: qty 40, 10d supply, fill #0

## 2021-05-25 MED ORDER — PAROXETINE HCL 20 MG PO TABS
20.0000 mg | ORAL_TABLET | Freq: Every day | ORAL | 0 refills | Status: DC
  Filled 2021-05-25: qty 30, 30d supply, fill #0

## 2021-05-25 NOTE — Progress Notes (Signed)
Subjective:    Patient ID: Laurie Bell, female    DOB: 1981-04-29, 40 y.o.   MRN: [id number]  HPI Laurie Bell is a 40 year old African-American female, recently established patient of Dr. Lorenso Bell.  She underwent EGD on 04/28/2021, after being seen in the office with complaints of heartburn and dysphagia.  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  Biopsies of the stomach showed reactive gastropathy, no H. pylori, biopsies of the esophagus showed some reflux changes, no intestinal metaplasia and in the proximal esophagus mild reflux esophagitis type changes. She was placed on twice daily Protonix and is now also taking Carafate suspension 4 times daily between meals and at bedtime. She comes in today for follow-up, stating that she is not really having any abdominal pain at this time but continues to have a sensation of food sticking or sitting in her esophagus.  She points both to the area of her neck and also in the mid sternal area.  No difficulty with liquids, just solid foods.  This is not occurring with every meal.  She says she feels like she needs to keep clearing her throat and occasionally has an episode requiring regurgitation.  She is describing discomfort and tenderness in the neck area and some tenderness in her chest as well.  She says she is not sure what to eat, and definitely has been eating less because of the concerns for food potentially becoming stuck. She is down about 10 pounds overall since onset of her symptoms which was about 3 months ago. She is scheduled for follow-up EGD to confirm healing of the gastric ulcers in February 41  She is scheduled for esophageal manometry on 41/05/2021 and also has pending evaluation with speech pathology for modified swallow study. On further discussion with patient she definitely has some anxiety over her recent  diagnoses and this may be contributing.  She describes more of a tightness type of feeling in her neck, this is occurring even when she is not swallowing.  Review of Systems Pertinent positive and negative review of systems were noted in the above HPI section.  All other review of systems was otherwise negative.   Outpatient Encounter Medications as of 05/25/2021  Medication Sig   acetaminophen (TYLENOL) 500 MG tablet Take 1,000 mg by mouth every 6 (six) hours as needed for mild pain.   benzonatate (TESSALON) 100 MG capsule Take 1 capsule (100 mg total) by mouth 3 (three)  times daily as needed for up to 10 days.   gabapentin (NEURONTIN) 300 MG capsule Take 1 capsule (300 mg total) by mouth 2 (two) times daily.   hydrOXYzine (ATARAX/VISTARIL) 10 MG tablet Take 1 tablet (10 mg total) by mouth 3 (three) times daily as needed for anxiety.   hyoscyamine (LEVSIN SL) 0.125 MG SL tablet Place 1 tablet (0.125 mg total) under the tongue every 6 (six) hours as needed.   ondansetron (ZOFRAN-ODT) 4 MG disintegrating tablet Take 1 tablet (4 mg total) by mouth every 8 (eight) hours as needed for nausea or vomiting.   pantoprazole (PROTONIX) 40 MG tablet Take 1 tablet (40 mg total) by mouth 2 (two) times daily before a meal.   [DISCONTINUED] PARoxetine (PAXIL) 20 MG tablet Take 1 tablet (20 mg total) by mouth daily.   [DISCONTINUED] sucralfate (CARAFATE) 1 GM/10ML suspension Take 10 mLs (1 g total) by mouth 4 (four) times daily -  with meals and at bedtime.   pantoprazole (PROTONIX) 40 MG tablet Take 1 tablet (40 mg total) by mouth 2 (two) times daily before a meal.   sucralfate (CARAFATE) 1 GM/10ML suspension Take 10 mLs (1 g total) by mouth 4 (four) times daily -  with meals and at bedtime.   [DISCONTINUED] medroxyPROGESTERone (PROVERA) 10 MG tablet Take 2 tablets by mouth daily (Patient not taking: Reported on 06/05/2019)   No facility-administered encounter medications on file as of 05/25/2021.   Allergies   Allergen Reactions   Latex Other (See Comments)    Burn in vaginal area with latex condoms   Patient Active Problem List   Diagnosis Date Noted   Irregular heart rhythm 04/15/2021   Numbness on left side 04/15/2021   Dysphagia 03/17/2021   Hypomagnesemia 02/23/2021   Gastroesophageal reflux disease    Hypokalemia 08/24/2020   Atypical chest pain 08/24/2020   Nausea and vomiting 08/24/2020   Hypocalcemia 08/24/2020   DUB (dysfunctional uterine bleeding) 01/12/2017   Fibroid, uterine 01/12/2017   Social History   Socioeconomic History   Marital status: Single    Spouse name: Not on file   Number of children: 2   Years of education: Not on file   Highest education level: Not on file  Occupational History   Occupation: Best boy: [employer]  Tobacco Use   Smoking status: Never   Smokeless tobacco: Never  Vaping Use   Vaping Use: Never used  Substance and Sexual Activity   Alcohol use: No   Drug use: Not Currently    Types: Marijuana    Comment: quit 2018   Sexual activity: Not Currently    Birth control/protection: Surgical  Other Topics Concern   Not on file  Social History Narrative   Right Handed    Lives in a one story home   Social Determinants of Health   Financial Resource Strain: Not on file  Food Insecurity: Not on file  Transportation Needs: Not on file  Physical Activity: Not on file  Stress: Not on file  Social Connections: Not on file  Intimate Partner Violence: Not on file    [name]'s family history includes Cancer in her maternal grandmother; Hypertension in her maternal aunt and mother.      Objective:    Vitals:   05/25/21 1334  BP: 104/70  Pulse: 90    Physical Exam Well-developed well-nourished AA female  in no acute distress.  Height, Weight, 179 BMI28.04  HEENT; nontraumatic normocephalic, EOMI, PE R LA, sclera anicteric. Oropharynx;not examined Neck; supple, no JVD , no definite thyromegaly or palpable lesion,  she is tender anteriorly to palpation of the neck Cardiovascular; regular rate and rhythm with S1-S2, no murmur rub or gallop, mild sternal tenderness to direct pressure Pulmonary; Clear bilaterally Abdomen; soft, nontender, nondistended, no palpable mass or hepatosplenomegaly, bowel sounds are active Rectal; not done today Skin; benign exam, no jaundice rash or appreciable lesions Extremities; no clubbing cyanosis or edema skin warm and dry Neuro/Psych; alert and oriented x4, grossly nonfocal mood and affect appropriate        Assessment & Plan:   #40 40 year old African-American female with 3 to 31-month history of GERD symptoms, chest discomfort, and dysphagia.  She has had some associated weight loss. EGD interestingly showed 4 nonbleeding cratered gastric ulcers at the pylorus, largest 8 mm, no evidence for H. pylori on biopsies. There was no active esophagitis visually but biopsies did show reflux changes from  the distal esophagus and the proximal esophagus. 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 twice daily AC Continue Carafate suspension 1 g between meals and at bedtime Will add trial of Levsin sublingual every 4-6 hours as needed to see if this helps with possible esophageal spasm/globus type symptoms  Patient is already scheduled for follow-up EGD to confirm healing of the gastric ulcers with Dr. Lorenso Bell February 2023 She has upcoming appointment with speech pathology for modified swallowing study She is scheduled for esophageal manometry in January Will add soft tissue CT neck. She is drinking 2 Ensure per day, encouraged her to continue this, we went over a soft diet  with avoidance of dry foods and larger pieces of meat.  I reassured her that she could branch out some on her diet and we discussed multiple options.    Carlous Olivares S Lorissa Kishbaugh PA-C 05/25/2021   Cc: Dorna Mai, MD

## 2021-05-25 NOTE — Telephone Encounter (Signed)
Patient is aware of referral

## 2021-05-25 NOTE — Telephone Encounter (Signed)
Rx #: [id number]  PARoxetine (PAXIL) 20 MG tablet [address], [place] [address]  Phone:  [phone]  Fax:  [phone]  DEA #:  [id number]

## 2021-05-25 NOTE — Progress Notes (Signed)
I agree with the assessment and plan as outlined by Ms. [place].

## 2021-05-25 NOTE — Patient Instructions (Signed)
If you are age 42 or younger, your body mass index should be between 19-25. Your Body mass index is 28.04 kg/m. If this is out of the aformentioned range listed, please consider follow up with your Primary Care Provider.  ________________________________________________________  The [facility] providers would like to encourage you to use [facility] to communicate with providers for non-urgent requests or questions.  Due to long hold times on the telephone, sending your provider a message by [facility] may be a faster and more efficient way to get a response.  Please allow 48 business hours for a response.  Please remember that this is for non-urgent requests.  _______________________________________________________  Dennis Bast have been scheduled for a CT scan of the soft tissues of the neck at [place] ([address]---this is in the same building as [employer]).   You are scheduled on 05/30/2021 at 1:30 pm. You should arrive 15 minutes prior to your appointment time for registration. Please follow the written instructions below on the day of your exam:  WARNING: IF YOU ARE ALLERGIC TO IODINE/X-RAY DYE, PLEASE NOTIFY RADIOLOGY IMMEDIATELY AT [phone]! YOU WILL BE GIVEN A 13 HOUR PREMEDICATION PREP.  1) Do not eat anything after 11:30 am (2 hours prior to your test), drink plenty of fluids. 2) Please remove all jewelry prior to your appointment.  You may take any medications as prescribed with a small amount of water, if necessary. If you take any of the following medications: METFORMIN, GLUCOPHAGE, GLUCOVANCE, AVANDAMET, RIOMET, FORTAMET, [place] MET, JANUMET, GLUMETZA or METAGLIP, you MAY be asked to HOLD this medication 48 hours AFTER the exam.  The purpose of you drinking the oral contrast is to aid in the visualization of your intestinal tract. The contrast solution may cause some diarrhea. Depending on your individual set of symptoms, you may also receive an intravenous  injection of x-ray contrast/dye. Plan on being at [facility] for 30 minutes or longer, depending on the type of exam you are having performed.  This test typically takes 30-45 minutes to complete.  If you have any questions regarding your exam or if you need to reschedule, you may call the CT department at [phone] between the hours of 8:00 am and 5:00 pm, Monday-Friday.  ____________________________________________________________  Continue Pantoprazole twice daily and Carafate suspension.  START Levsin sublingual 1 tablet every six hours as needed for esophageal spasms.  Keep your appointment for your Endoscopy on 06/23/2021 and Esophageal Manometry 07/27/2021.  Thank you for entrusting me with your care and choosing [facility].  Amy Esterwood, PA-C

## 2021-05-26 ENCOUNTER — Other Ambulatory Visit: Payer: Self-pay

## 2021-05-26 ENCOUNTER — Encounter (HOSPITAL_COMMUNITY): Payer: Self-pay

## 2021-05-26 ENCOUNTER — Ambulatory Visit (HOSPITAL_COMMUNITY): Payer: Self-pay

## 2021-05-27 ENCOUNTER — Ambulatory Visit (HOSPITAL_COMMUNITY)
Admission: RE | Admit: 2021-05-27 | Discharge: 2021-05-27 | Disposition: A | Discharge status: Home / Self Care | Payer: Self-pay | Source: Ambulatory Visit | Attending: Internal Medicine | Admitting: Internal Medicine

## 2021-05-27 ENCOUNTER — Other Ambulatory Visit: Payer: Self-pay

## 2021-05-27 DIAGNOSIS — Z79899 Other long term (current) drug therapy: Secondary | ICD-10-CM | POA: Insufficient documentation

## 2021-05-27 DIAGNOSIS — R634 Abnormal weight loss: Secondary | ICD-10-CM | POA: Insufficient documentation

## 2021-05-27 DIAGNOSIS — R1310 Dysphagia, unspecified: Secondary | ICD-10-CM | POA: Insufficient documentation

## 2021-05-27 DIAGNOSIS — K259 Gastric ulcer, unspecified as acute or chronic, without hemorrhage or perforation: Secondary | ICD-10-CM | POA: Insufficient documentation

## 2021-05-27 DIAGNOSIS — K219 Gastro-esophageal reflux disease without esophagitis: Secondary | ICD-10-CM | POA: Insufficient documentation

## 2021-05-27 MED ORDER — METHYLPREDNISOLONE 4 MG PO TBPK
ORAL_TABLET | ORAL | 0 refills | Status: DC
  Filled 2021-05-27: qty 21, 6d supply, fill #0

## 2021-05-27 MED ORDER — TIZANIDINE HCL 2 MG PO TABS
ORAL_TABLET | ORAL | 0 refills | Status: DC
  Filled 2021-05-27: qty 30, 7d supply, fill #0

## 2021-05-27 NOTE — Progress Notes (Signed)
Objective Swallowing Evaluation: Type of Study: MBS-Modified Barium Swallow Study   Patient Details  Name: Laurie Bell MRN: [id number] Date of Birth: 1980-08-21  Today's Date: 05/27/2021 Time: SLP Start Time (ACUTE ONLY): 1140 -SLP Stop Time (ACUTE ONLY): 1761  SLP Time Calculation (min) (ACUTE ONLY): 35 min   Past Medical History:  Past Medical History:  Diagnosis Date   Anxiety    Chlamydia contact, treated    GERD (gastroesophageal reflux disease)    Past Surgical History:  Past Surgical History:  Procedure Laterality Date   ENDOMETRIAL ABLATION N/A 04/25/2017   Procedure: ENDOMETRIAL ABLATION With NOVASURE;  Surgeon: Woodroe Mode, MD;  Location: [place];  Service: Gynecology;  Laterality: N/A;   TUBAL LIGATION     HPI: Laurie Bell is a pleasant 40 y.o. female who was referred by GI for OP MBS. She has had hx of dysphagia since this past summer, starting with painful episodes of reflux, described as feeling like a heart attack.  She describes globus and belching. She endorses food avoidance behaviors and weight loss.  EGD showed four nonbleeding cratered gastric ulcers at the pylorus, largest 8 mm, no evidence for H. pylori on biopsies. no active esophagitis visually but biopsies did show reflux changes from the distal esophagus and the proximal esophagus. 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 She takes protonix 40 mg, twice daily and Carafate 1 g between meals and at bedtime. She was seen by GI on 12/14, who encouraged her to continue to drink Ensures, to avoid dry food/large pieces of meat and branch out to other foods.   Subjective: good historian    Recommendations for follow up therapy are one component of a multi-disciplinary discharge planning process, led by the attending physician.  Recommendations may be updated based on patient status, additional functional criteria and insurance authorization.  Assessment / Plan /  Recommendation  Clinical Impressions 05/27/2021  Clinical Impression Pt presents with a functional dysphagia that has a significant negative impact on her life - she avoids eating in restaurants; she limits her diet to a narrow selection of foods; she has lost weight.  During today's study, Laurie Bell self-limited bolus sizes (solid foods ~1/2 tspn; liquids ~ 5 ml) and required encouragement to consume >10 ml sips of liquid or tablespoon sized solid boluses.  She verbalized her concerns about increasing bolus size and became tearful and apologetic and discussed her fears related to swallowing.  Offered encouragement.  Imaging revealed normal biomechanics of the swallow with thorough mastication, timely swallow response, normal pharyngeal squeeze, reliable laryngeal vestibule closure, no penetration/aspiration, no obvious stasis in esophagus upon brief screen.  Laurie Bell identifed globus sensation during the study. We watched the study together in real time; we discussed the anatomical separation of the airway from the esophagus and discussed that the feeling of pressure when eating is related to her esophagus, but that her breathing is not compromised.  She was encouraged to record a few frames of the video as affirmation of the normal mechanics of her swallowing.  We discussed recommendations, and Laurie Bell agreed she might benefit from OP SLP to address the functional nature of her dysphagia. Recommend referral to [facility] neuro OP clinic to address the aforementioned condition.  SLP Visit Diagnosis Dysphagia, unspecified (R13.10)  Attention and concentration deficit following --  Frontal lobe and executive function deficit following --  Impact on safety and function Risk for inadequate nutrition/hydration      Treatment  Recommendations 05/27/2021  Treatment Recommendations Defer treatment plan to f/u with SLP     No flowsheet data found.  Diet Recommendations 05/27/2021  SLP Diet Recommendations  Regular solids;Thin liquid  Liquid Administration via Cup;Straw  Medication Administration Whole meds with liquid  Compensations --  Postural Changes --      Other Recommendations 05/27/2021  Recommended Consults --  Oral Care Recommendations Oral care BID  Other Recommendations --  Follow Up Recommendations Outpatient SLP  Assistance recommended at discharge --  Functional Status Assessment --    No flowsheet data found.    Oral Phase 05/27/2021  Oral Phase WFL  Oral - Pudding Teaspoon --  Oral - Pudding Cup --  Oral - Honey Teaspoon --  Oral - Honey Cup --  Oral - Nectar Teaspoon --  Oral - Nectar Cup --  Oral - Nectar Straw --  Oral - Thin Teaspoon --  Oral - Thin Cup --  Oral - Thin Straw --  Oral - Puree --  Oral - Mech Soft --  Oral - Regular --  Oral - Multi-Consistency --  Oral - Pill --  Oral Phase - Comment --    Pharyngeal Phase 05/27/2021  Pharyngeal Phase WFL  Pharyngeal- Pudding Teaspoon --  Pharyngeal --  Pharyngeal- Pudding Cup --  Pharyngeal --  Pharyngeal- Honey Teaspoon --  Pharyngeal --  Pharyngeal- Honey Cup --  Pharyngeal --  Pharyngeal- Nectar Teaspoon --  Pharyngeal --  Pharyngeal- Nectar Cup --  Pharyngeal --  Pharyngeal- Nectar Straw --  Pharyngeal --  Pharyngeal- Thin Teaspoon --  Pharyngeal --  Pharyngeal- Thin Cup --  Pharyngeal --  Pharyngeal- Thin Straw --  Pharyngeal --  Pharyngeal- Puree --  Pharyngeal --  Pharyngeal- Mechanical Soft --  Pharyngeal --  Pharyngeal- Regular --  Pharyngeal --  Pharyngeal- Multi-consistency --  Pharyngeal --  Pharyngeal- Pill --  Pharyngeal --  Pharyngeal Comment --     Cervical Esophageal Phase  05/27/2021  Cervical Esophageal Phase WFL  Pudding Teaspoon --  Pudding Cup --  Honey Teaspoon --  Honey Cup --  Nectar Teaspoon --  Nectar Cup --  Nectar Straw --  Thin Teaspoon --  Thin Cup --  Thin Straw --  Puree --  Mechanical Soft --  Regular --  Multi-consistency --   Pill --  Cervical Esophageal Comment --     Laurie Bell 05/27/2021, 1:15 PM

## 2021-05-30 ENCOUNTER — Other Ambulatory Visit: Payer: Self-pay

## 2021-05-30 ENCOUNTER — Ambulatory Visit (INDEPENDENT_AMBULATORY_CARE_PROVIDER_SITE_OTHER)
Admission: RE | Admit: 2021-05-30 | Discharge: 2021-05-30 | Disposition: A | Discharge status: Home / Self Care | Payer: Self-pay | Source: Ambulatory Visit | Attending: Physician Assistant | Admitting: Physician Assistant

## 2021-05-30 DIAGNOSIS — K219 Gastro-esophageal reflux disease without esophagitis: Secondary | ICD-10-CM

## 2021-05-30 DIAGNOSIS — R1310 Dysphagia, unspecified: Secondary | ICD-10-CM

## 2021-05-30 DIAGNOSIS — K259 Gastric ulcer, unspecified as acute or chronic, without hemorrhage or perforation: Secondary | ICD-10-CM

## 2021-05-30 MED ORDER — IOHEXOL 300 MG/ML  SOLN
75.0000 mL | Freq: Once | INTRAMUSCULAR | Status: AC | PRN
  Administered 2021-05-30: 75 mL via INTRAVENOUS

## 2021-05-31 ENCOUNTER — Telehealth: Payer: Self-pay | Admitting: Family Medicine

## 2021-05-31 ENCOUNTER — Ambulatory Visit (INDEPENDENT_AMBULATORY_CARE_PROVIDER_SITE_OTHER): Payer: Self-pay | Admitting: Family Medicine

## 2021-05-31 ENCOUNTER — Other Ambulatory Visit: Payer: Self-pay

## 2021-05-31 VITALS — BP 109/76 | HR 98 | Temp 98.0°F | Resp 16 | Wt 173.6 lb

## 2021-05-31 DIAGNOSIS — R630 Anorexia: Secondary | ICD-10-CM

## 2021-05-31 DIAGNOSIS — F419 Anxiety disorder, unspecified: Secondary | ICD-10-CM

## 2021-05-31 DIAGNOSIS — R079 Chest pain, unspecified: Secondary | ICD-10-CM

## 2021-05-31 MED ORDER — TRAMADOL HCL 50 MG PO TABS: 50.0000 mg | ORAL_TABLET | Freq: Four times a day (QID) | ORAL | 0 refills | Status: AC | PRN

## 2021-05-31 MED ORDER — HYDROXYZINE HCL 25 MG PO TABS
25.0000 mg | ORAL_TABLET | Freq: Three times a day (TID) | ORAL | 0 refills | Status: DC
  Filled 2021-05-31: qty 60, 20d supply, fill #0

## 2021-05-31 MED ORDER — MEGESTROL ACETATE 40 MG PO TABS
40.0000 mg | ORAL_TABLET | Freq: Every day | ORAL | 0 refills | Status: DC
  Filled 2021-05-31: qty 30, 30d supply, fill #0

## 2021-05-31 NOTE — Progress Notes (Signed)
Patient is here for her CPE. Patient has been struggling with not feeling well for weeks and she is trying to see what's going on with her. Patient is not very happy because of her weight loss

## 2021-05-31 NOTE — Telephone Encounter (Signed)
Pt called after leaving her appt and asked about the information for the thyroid specialist discussed in the appt and to check if the diabetic testing was sent in. Pt states her mother inquired about that so pt wanted to make sure testing is sent to know results soon.

## 2021-06-01 ENCOUNTER — Encounter: Payer: Self-pay | Admitting: Family Medicine

## 2021-06-01 NOTE — Progress Notes (Signed)
Established Patient Office Visit  Subjective:  Patient ID: Laurie Bell, female    DOB: 11/08/1980  Age: 40 y.o. MRN: [id number]  CC:  Chief Complaint  Patient presents with   Annual Exam    HPI Laurie Bell presents for increased anxiety 2/2 decreased appetite and weight as well as persistent left sided chest/flank/back symptoms.   Past Medical History:  Diagnosis Date   Anxiety    Chlamydia contact, treated    GERD (gastroesophageal reflux disease)     Past Surgical History:  Procedure Laterality Date   ENDOMETRIAL ABLATION N/A 04/25/2017   Procedure: ENDOMETRIAL ABLATION With NOVASURE;  Surgeon: Woodroe Mode, MD;  Location: [place];  Service: Gynecology;  Laterality: N/A;   TUBAL LIGATION      Family History  Problem Relation Age of Onset   Hypertension Mother    Hypertension Maternal Aunt    Cancer Maternal Grandmother        type unknown    Social History   Socioeconomic History   Marital status: Single    Spouse name: Not on file   Number of children: 2   Years of education: Not on file   Highest education level: Not on file  Occupational History   Occupation: Best boy: [employer]  Tobacco Use   Smoking status: Never   Smokeless tobacco: Never  Vaping Use   Vaping Use: Never used  Substance and Sexual Activity   Alcohol use: No   Drug use: Not Currently    Types: Marijuana    Comment: quit 2018   Sexual activity: Not Currently    Birth control/protection: Surgical  Other Topics Concern   Not on file  Social History Narrative   Right Handed    Lives in a one story home   Social Determinants of Health   Financial Resource Strain: Not on file  Food Insecurity: Not on file  Transportation Needs: Not on file  Physical Activity: Not on file  Stress: Not on file  Social Connections: Not on file  Intimate Partner Violence: Not on file    ROS Review of Systems  Objective:   Today's Vitals: BP  109/76   Pulse 98   Temp 98 F (36.7 C) (Oral)   Resp 16   Wt 173 lb 9.6 oz (78.7 kg)   LMP 05/16/2021 (Approximate)   SpO2 98%   BMI 27.19 kg/m   Physical Exam Vitals and nursing note reviewed.  Constitutional:      General: She is not in acute distress. Neck:     Thyroid: No thyromegaly.  Cardiovascular:     Rate and Rhythm: Normal rate and regular rhythm.  Pulmonary:     Effort: Pulmonary effort is normal.     Breath sounds: Normal breath sounds.  Musculoskeletal:     Cervical back: Normal range of motion and neck supple.  Neurological:     General: No focal deficit present.     Mental Status: She is alert and oriented to person, place, and time.  Psychiatric:        Mood and Affect: Mood is anxious. Affect is angry.        Speech: Speech is rapid and pressured.        Behavior: Behavior is agitated.    Assessment & Plan:   1. Decreased appetite Megace prescribed. Discussed that patient may need to schedule when she will eat at least a minimal amount.   2.  Anxiety Atarax prescribed. Will monitor  3. Left-sided chest pain Work ups so far have been unremarkable. ?2/2 anxiety. Will prescribe tramadol as and adjunct to tylenol/nsaids and monitor.   Outpatient Encounter Medications as of 05/31/2021  Medication Sig   acetaminophen (TYLENOL) 500 MG tablet Take 1,000 mg by mouth every 6 (six) hours as needed for mild pain.   benzonatate (TESSALON) 100 MG capsule Take 1 capsule (100 mg total) by mouth 3 (three)  times daily as needed for up to 10 days.   gabapentin (NEURONTIN) 300 MG capsule Take 1 capsule (300 mg total) by mouth 2 (two) times daily.   hydrOXYzine (ATARAX) 25 MG tablet Take 1 tablet (25 mg total) by mouth 3 (three) times daily.   hyoscyamine (LEVSIN SL) 0.125 MG SL tablet Place 1 tablet (0.125 mg total) under the tongue every 6 (six) hours as needed.   megestrol (MEGACE) 40 MG tablet Take 1 tablet (40 mg total) by mouth daily.   methylPREDNISolone  (MEDROL DOSEPAK) 4 MG TBPK tablet Take 6 tablets by mouth on day 1, 5 tablets on day 2, 4 tablets on day 3, 3 tablets on day 4, 2 tablets on day 5, and 1 tablet on day 6.   ondansetron (ZOFRAN-ODT) 4 MG disintegrating tablet Take 1 tablet (4 mg total) by mouth every 8 (eight) hours as needed for nausea or vomiting.   pantoprazole (PROTONIX) 40 MG tablet Take 1 tablet (40 mg total) by mouth 2 (two) times daily before a meal.   pantoprazole (PROTONIX) 40 MG tablet Take 1 tablet (40 mg total) by mouth 2 (two) times daily before a meal.   PARoxetine (PAXIL) 20 MG tablet Take 1 tablet (20 mg total) by mouth daily.   sucralfate (CARAFATE) 1 GM/10ML suspension Take 10 mLs (1 g total) by mouth 4 (four) times daily -  with meals and at bedtime.   tiZANidine (ZANAFLEX) 2 MG tablet Take 1/4-1 tablet by mouth up to 4 times a day   traMADol (ULTRAM) 50 MG tablet Take 1 tablet (50 mg total) by mouth every 6 (six) hours as needed for up to 5 days.   [DISCONTINUED] hydrOXYzine (ATARAX/VISTARIL) 10 MG tablet Take 1 tablet (10 mg total) by mouth 3 (three) times daily as needed for anxiety.   [DISCONTINUED] medroxyPROGESTERone (PROVERA) 10 MG tablet Take 2 tablets by mouth daily (Patient not taking: Reported on 06/05/2019)   No facility-administered encounter medications on file as of 05/31/2021.    Follow-up: No follow-ups on file.   Becky Sax, MD

## 2021-06-02 ENCOUNTER — Other Ambulatory Visit: Payer: Self-pay | Admitting: Family Medicine

## 2021-06-03 NOTE — Telephone Encounter (Signed)
Patient is aware of provider advise. Patient is not happy about being "limited" mgnt.

## 2021-06-09 ENCOUNTER — Other Ambulatory Visit: Payer: Self-pay

## 2021-06-09 ENCOUNTER — Emergency Department (HOSPITAL_BASED_OUTPATIENT_CLINIC_OR_DEPARTMENT_OTHER): Payer: Self-pay | Admitting: Radiology

## 2021-06-09 ENCOUNTER — Ambulatory Visit
Admission: RE | Admit: 2021-06-09 | Discharge: 2021-06-09 | Disposition: A | Discharge status: Home / Self Care | Payer: Self-pay | Source: Ambulatory Visit | Attending: Neurology | Admitting: Neurology

## 2021-06-09 ENCOUNTER — Encounter (HOSPITAL_BASED_OUTPATIENT_CLINIC_OR_DEPARTMENT_OTHER): Payer: Self-pay | Admitting: Emergency Medicine

## 2021-06-09 DIAGNOSIS — R0789 Other chest pain: Secondary | ICD-10-CM | POA: Insufficient documentation

## 2021-06-09 DIAGNOSIS — R110 Nausea: Secondary | ICD-10-CM | POA: Insufficient documentation

## 2021-06-09 DIAGNOSIS — R202 Paresthesia of skin: Secondary | ICD-10-CM

## 2021-06-09 DIAGNOSIS — Z91040 Latex allergy status: Secondary | ICD-10-CM | POA: Insufficient documentation

## 2021-06-09 DIAGNOSIS — M549 Dorsalgia, unspecified: Secondary | ICD-10-CM | POA: Insufficient documentation

## 2021-06-09 DIAGNOSIS — R200 Anesthesia of skin: Secondary | ICD-10-CM

## 2021-06-09 LAB — CBC
HCT: 39.4 % (ref 36.0–46.0)
Hemoglobin: 13.3 g/dL (ref 12.0–15.0)
MCH: 28.7 pg (ref 26.0–34.0)
MCHC: 33.8 g/dL (ref 30.0–36.0)
MCV: 84.9 fL (ref 80.0–100.0)
Platelets: 294 10*3/uL (ref 150–400)
RBC: 4.64 MIL/uL (ref 3.87–5.11)
RDW: 13.2 % (ref 11.5–15.5)
WBC: 9.8 10*3/uL (ref 4.0–10.5)
nRBC: 0 % (ref 0.0–0.2)

## 2021-06-09 LAB — TROPONIN I (HIGH SENSITIVITY): Troponin I (High Sensitivity): <2 ng/L (ref <18)

## 2021-06-09 LAB — BASIC METABOLIC PANEL
Anion gap: 8 (ref 5–15)
BUN: 9 mg/dL (ref 6–20)
CO2: 25 mmol/L (ref 22–32)
Calcium: 9.8 mg/dL (ref 8.9–10.3)
Chloride: 104 mmol/L (ref 98–111)
Creatinine, Ser: 0.81 mg/dL (ref 0.44–1.00)
GFR, Estimated: >60 mL/min (ref >60)
Glucose, Bld: 96 mg/dL (ref 70–99)
Potassium: 3.6 mmol/L (ref 3.5–5.1)
Sodium: 137 mmol/L (ref 135–145)

## 2021-06-09 NOTE — ED Triage Notes (Signed)
Pt via pov from home with cp that started today as well as arm pain that has been going on for at least a year. Pt states she has acid reflux but that this feels different. She reports that she was supposed to have an mri today on her arm but that she rescheduled it. Pt alert & oriented, nad noted.

## 2021-06-10 ENCOUNTER — Other Ambulatory Visit (HOSPITAL_COMMUNITY): Payer: Self-pay

## 2021-06-10 ENCOUNTER — Other Ambulatory Visit: Payer: Self-pay

## 2021-06-10 ENCOUNTER — Emergency Department (HOSPITAL_BASED_OUTPATIENT_CLINIC_OR_DEPARTMENT_OTHER)
Admission: EM | Admit: 2021-06-10 | Discharge: 2021-06-10 | Disposition: A | Discharge status: Home / Self Care | Payer: Self-pay | Attending: Emergency Medicine | Admitting: Emergency Medicine

## 2021-06-10 DIAGNOSIS — R0789 Other chest pain: Secondary | ICD-10-CM

## 2021-06-10 LAB — HEPATIC FUNCTION PANEL
ALT: 16 U/L (ref 0–44)
AST: 12 U/L — ABNORMAL LOW (ref 15–41)
Albumin: 4 g/dL (ref 3.5–5.0)
Alkaline Phosphatase: 87 U/L (ref 38–126)
Bilirubin, Direct: 0.1 mg/dL (ref 0.0–0.2)
Indirect Bilirubin: 0.4 mg/dL (ref 0.3–0.9)
Total Bilirubin: 0.5 mg/dL (ref 0.3–1.2)
Total Protein: 7.4 g/dL (ref 6.5–8.1)

## 2021-06-10 LAB — LIPASE, BLOOD: Lipase: 34 U/L (ref 11–51)

## 2021-06-10 LAB — PREGNANCY, URINE: Preg Test, Ur: NEGATIVE

## 2021-06-10 LAB — TROPONIN I (HIGH SENSITIVITY): Troponin I (High Sensitivity): <2 ng/L (ref <18)

## 2021-06-10 LAB — D-DIMER, QUANTITATIVE: D-Dimer, Quant: <0.27 ug/mL-FEU (ref 0.00–0.50)

## 2021-06-10 MED ORDER — DICLOFENAC SODIUM 1 % EX GEL
2.0000 g | Freq: Four times a day (QID) | CUTANEOUS | 0 refills | Status: DC
  Filled 2021-06-10: qty 100, 13d supply, fill #0

## 2021-06-10 MED ORDER — KETOROLAC TROMETHAMINE 30 MG/ML IJ SOLN: 30.0000 mg | Freq: Once | INTRAMUSCULAR | Status: DC

## 2021-06-10 MED ORDER — ALUM & MAG HYDROXIDE-SIMETH 200-200-20 MG/5ML PO SUSP
30.0000 mL | Freq: Once | ORAL | Status: AC
  Administered 2021-06-10: 30 mL via ORAL
  Filled 2021-06-10: qty 30

## 2021-06-10 MED ORDER — HYDROCODONE-ACETAMINOPHEN 5-325 MG PO TABS: 1.0000 | ORAL_TABLET | Freq: Once | ORAL | Status: DC

## 2021-06-10 MED ORDER — SUCRALFATE 1 GM/10ML PO SUSP
1.0000 g | Freq: Once | ORAL | Status: AC
  Administered 2021-06-10: 1 g via ORAL
  Filled 2021-06-10: qty 10

## 2021-06-10 MED ORDER — KETOROLAC TROMETHAMINE 30 MG/ML IJ SOLN
30.0000 mg | Freq: Once | INTRAMUSCULAR | Status: AC
  Administered 2021-06-10: 30 mg via INTRAVENOUS
  Filled 2021-06-10: qty 1

## 2021-06-10 MED ORDER — LIDOCAINE VISCOUS HCL 2 % MT SOLN
15.0000 mL | Freq: Once | OROMUCOSAL | Status: AC
  Administered 2021-06-10: 15 mL via ORAL
  Filled 2021-06-10: qty 15

## 2021-06-10 NOTE — ED Provider Notes (Signed)
[facility] EMERGENCY DEPT Provider Note   CSN: [id number] Arrival date & time: 06/09/21  2036     History Chief Complaint  Patient presents with   Chest Pain    Laurie Bell is a 40 y.o. female.  Patient with acid reflux disease and anxiety presenting with central chest pain since approximately 5:30 PM while she was resting.  The pain radiates to her left arm, back and feels different than her usual GERD type pain.  She does have a extensive GERD here history and states she takes Protonix as well as Carafate and states compliance.  She has never had this kind of chest pain before.  Pain is worse with movement and palpation.  Did have nausea today but no vomiting.  No shortness of breath, cough or fever.  No leg pain or leg swelling. No abdominal pain.  No black or bloody stools.  No pain with urination or blood in the urine. No fevers, chills, vomiting. Denies any cardiac history.  States she has had EGDs in the past that showed she has ulcers.  The history is provided by the patient.  Chest Pain Associated symptoms: back pain and nausea   Associated symptoms: no abdominal pain, no dizziness, no fever, no headache, no vomiting and no weakness       Past Medical History:  Diagnosis Date   Anxiety    Chlamydia contact, treated    GERD (gastroesophageal reflux disease)     Patient Active Problem List   Diagnosis Date Noted   Irregular heart rhythm 04/15/2021   Numbness on left side 04/15/2021   Dysphagia 03/17/2021   Hypomagnesemia 02/23/2021   Gastroesophageal reflux disease    Hypokalemia 08/24/2020   Atypical chest pain 08/24/2020   Nausea and vomiting 08/24/2020   Hypocalcemia 08/24/2020   DUB (dysfunctional uterine bleeding) 01/12/2017   Fibroid, uterine 01/12/2017    Past Surgical History:  Procedure Laterality Date   ENDOMETRIAL ABLATION N/A 04/25/2017   Procedure: ENDOMETRIAL ABLATION With NOVASURE;  Surgeon: Woodroe Mode, MD;   Location: [place];  Service: Gynecology;  Laterality: N/A;   TUBAL LIGATION       OB History     Gravida  2   Para  2   Term  2   Preterm  0   AB  0   Living  2      SAB  0   IAB  0   Ectopic  0   Multiple  0   Live Births              Family History  Problem Relation Age of Onset   Hypertension Mother    Hypertension Maternal Aunt    Cancer Maternal Grandmother        type unknown    Social History   Tobacco Use   Smoking status: Never   Smokeless tobacco: Never  Vaping Use   Vaping Use: Never used  Substance Use Topics   Alcohol use: No   Drug use: Not Currently    Types: Marijuana    Comment: quit 2018    Home Medications Prior to Admission medications   Medication Sig Start Date End Date Taking? Authorizing Provider  acetaminophen (TYLENOL) 500 MG tablet Take 1,000 mg by mouth every 6 (six) hours as needed for mild pain.    [provider]  benzonatate (TESSALON) 100 MG capsule Take 1 capsule (100 mg total) by mouth 3 (three)  times daily as  needed for up to 10 days. 05/19/21     gabapentin (NEURONTIN) 300 MG capsule Take 1 capsule (300 mg total) by mouth 2 (two) times daily. 05/02/21   [name], NP  hydrOXYzine (ATARAX) 25 MG tablet Take 1 tablet (25 mg total) by mouth 3 (three) times daily. 05/31/21   Dorna Mai, MD  hyoscyamine (LEVSIN SL) 0.125 MG SL tablet Place 1 tablet (0.125 mg total) under the tongue every 6 (six) hours as needed. 05/25/21   Esterwood, Amy S, PA-C  megestrol (MEGACE) 40 MG tablet Take 1 tablet (40 mg total) by mouth daily. 05/31/21   Dorna Mai, MD  methylPREDNISolone (MEDROL DOSEPAK) 4 MG TBPK tablet Take 6 tablets by mouth on day 1, 5 tablets on day 2, 4 tablets on day 3, 3 tablets on day 4, 2 tablets on day 5, and 1 tablet on day 6. 05/26/21     ondansetron (ZOFRAN-ODT) 4 MG disintegrating tablet Take 1 tablet (4 mg total) by mouth every 8 (eight) hours as needed for nausea or  vomiting. 05/13/21   Caccavale, Sophia, PA-C  pantoprazole (PROTONIX) 40 MG tablet Take 1 tablet (40 mg total) by mouth 2 (two) times daily before a meal. 04/28/21   Sharyn Creamer, MD  pantoprazole (PROTONIX) 40 MG tablet Take 1 tablet (40 mg total) by mouth 2 (two) times daily before a meal. 05/25/21   Esterwood, Amy S, PA-C  PARoxetine (PAXIL) 20 MG tablet Take 1 tablet (20 mg total) by mouth daily. 05/25/21   Dorna Mai, MD  sucralfate (CARAFATE) 1 GM/10ML suspension Take 10 mLs (1 g total) by mouth 4 (four) times daily -  with meals and at bedtime. 05/25/21   Esterwood, Amy S, PA-C  tiZANidine (ZANAFLEX) 2 MG tablet Take 1/4-1 tablet by mouth up to 4 times a day 05/26/21     medroxyPROGESTERone (PROVERA) 10 MG tablet Take 2 tablets by mouth daily Patient not taking: Reported on 06/05/2019 02/06/18 02/02/20  Sloan Leiter, MD    Allergies    Latex  Review of Systems   Review of Systems  Constitutional:  Negative for activity change, appetite change and fever.  HENT:  Negative for congestion.   Cardiovascular:  Positive for chest pain.  Gastrointestinal:  Positive for nausea. Negative for abdominal pain and vomiting.  Genitourinary:  Negative for dysuria and hematuria.  Musculoskeletal:  Positive for arthralgias, back pain and myalgias.  Skin:  Negative for rash.  Neurological:  Negative for dizziness, weakness and headaches.   all other systems are negative except as noted in the HPI and PMH.   Physical Exam Updated Vital Signs BP 113/79 (BP Location: Right Arm)   Pulse 90   Temp 98.4 F (36.9 C)   Resp (!) 24   Ht 5\' 7"  (1.702 m)   Wt 78.9 kg   LMP 05/16/2021 (Approximate)   SpO2 100%   BMI 27.25 kg/m   Physical Exam Vitals and nursing note reviewed.  Constitutional:      General: She is not in acute distress.    Appearance: She is well-developed.  HENT:     Head: Normocephalic and atraumatic.     Mouth/Throat:     Pharynx: No oropharyngeal exudate.  Eyes:      Conjunctiva/sclera: Conjunctivae normal.     Pupils: Pupils are equal, round, and reactive to light.  Neck:     Comments: No meningismus. Cardiovascular:     Rate and Rhythm: Normal rate and regular rhythm.  Heart sounds: Normal heart sounds. No murmur heard. Pulmonary:     Effort: Pulmonary effort is normal. No respiratory distress.     Breath sounds: Normal breath sounds.     Comments: Emmaline Life NT present.  Reproducible chest tenderness to central chest wall and upper chest without bruising or ecchymosis Chest:     Chest wall: Tenderness present.  Abdominal:     Palpations: Abdomen is soft.     Tenderness: There is no abdominal tenderness. There is no guarding or rebound.  Musculoskeletal:        General: No tenderness. Normal range of motion.     Cervical back: Normal range of motion and neck supple.  Skin:    General: Skin is warm.  Neurological:     Mental Status: She is alert and oriented to person, place, and time.     Cranial Nerves: No cranial nerve deficit.     Motor: No abnormal muscle tone.     Coordination: Coordination normal.     Comments:  5/5 strength throughout. CN 2-12 intact.Equal grip strength.   Psychiatric:        Behavior: Behavior normal.    ED Results / Procedures / Treatments   Labs (all labs ordered are listed, but only abnormal results are displayed) Labs Reviewed  HEPATIC FUNCTION PANEL - Abnormal; Notable for the following components:      Result Value   AST 12 (*)    All other components within normal limits  BASIC METABOLIC PANEL  CBC  PREGNANCY, URINE  LIPASE, BLOOD  D-DIMER, QUANTITATIVE  TROPONIN I (HIGH SENSITIVITY)  TROPONIN I (HIGH SENSITIVITY)    EKG EKG Interpretation  Date/Time:  Thursday June 09 2021 20:44:08 EST Ventricular Rate:  93 PR Interval:  136 QRS Duration: 72 QT Interval:  304 QTC Calculation: 377 R Axis:   67 Text Interpretation: Sinus rhythm with sinus arrhythmia with occasional Premature  ventricular complexes Low voltage QRS Cannot rule out Anteroseptal infarct , age undetermined Abnormal ECG When compared with ECG of 13-May-2021 03:27, PREVIOUS ECG IS PRESENT No significant change was found Confirmed by Ezequiel Essex [phone]) on 06/10/2021 12:24:07 AM  Radiology DG Chest 2 View  Result Date: 06/09/2021 CLINICAL DATA:  Chest pain EXAM: CHEST - 2 VIEW COMPARISON:  05/12/2021 FINDINGS: The heart size and mediastinal contours are within normal limits. Both lungs are clear. The visualized skeletal structures are unremarkable. IMPRESSION: No active cardiopulmonary disease. Electronically Signed   By: Franchot Gallo M.D.   On: 06/09/2021 21:16    Procedures Procedures   Medications Ordered in ED Medications - No data to display  ED Course  I have reviewed the triage vital signs and the nursing notes.  Pertinent labs & imaging results that were available during my care of the patient were reviewed by me and considered in my medical decision making (see chart for details).  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 - Non-bleeding gastric ulcers with a clean ulcer base (Forrest Class III). Biopsied. - Normal examined duodenum. - Biopsies were taken with a cold forceps for histology in the proximal esophagus and in the distal esophagus    MDM Rules/Calculators/A&P                         Central chest pain since 5:30 PM.  Pain worse with palpation and movement.  EKG without acute ischemia   Pain is reproducible and worse with palpation.  She is given Toradol as well  as GI cocktail.  Troponin is negative x1.  LFTs and lipase are normal.  Low suspicion for acute gallbladder pathology.  Low suspicion for acute ACS or pulmonary embolism.  D-dimer is negative.  We will treat supportively with likely musculoskeletal chest pain and possible GI etiology of chest pain as well.  Low suspicion for ACS, aortic dissection, pulmonary embolism  Pain has improved. Troponin  negative x2.  Continue PPI, carafate. Avoid NSAIDs, alcohol, caffeine, spicy foods.  Topical voltaren as trying to avoid PO NSAIDs. Likely musculoskeletal chest pain with possible GI component as well.  Followup with PCP. Return to the ED with exertional chest pain, pain associated with SOB, diaphoresis, vomiting, or any other concerns.  Final Clinical Impression(s) / ED Diagnoses Final diagnoses:  Chest wall pain    Rx / DC Orders ED Discharge Orders     None        Olando Willems, Annie Main, MD 06/10/21 [phone]

## 2021-06-10 NOTE — Discharge Instructions (Addendum)
Your testing is reassuring.  No evidence of heart attack or blood clot in the lung.  Continue stomach medications as prescribed.  Avoid alcohol, caffeine, NSAID medications, spicy foods.  Return to the ED with exertional chest pain, pain associate with shortness of breath, nausea, sweating, any other concerns

## 2021-06-16 ENCOUNTER — Encounter: Payer: 59 | Attending: Internal Medicine | Admitting: Registered"

## 2021-06-16 ENCOUNTER — Other Ambulatory Visit: Payer: Self-pay

## 2021-06-16 ENCOUNTER — Encounter: Payer: Self-pay | Admitting: Registered"

## 2021-06-16 DIAGNOSIS — R1310 Dysphagia, unspecified: Secondary | ICD-10-CM | POA: Insufficient documentation

## 2021-06-16 DIAGNOSIS — R634 Abnormal weight loss: Secondary | ICD-10-CM | POA: Diagnosis present

## 2021-06-16 NOTE — Patient Instructions (Addendum)
Instructions/Goals:  Eat first meal within 1 hour of waking and eat after that time every 3-4 hours while awake.   Avoid eating within 3 hours of laying down.   Have a calm eating environment as much as possible. Eat slowly and chew to applesauce like texture.   Include smooth foods that go down well. At meals, include a grain/starch, a protein and a fruit/vegetable that is smooth. If unable to eat a meal, drink an Ensure.  Avoid spicy foods, acidic foods like tomatoes or citrus, high fat foods like whole milk or fried foods, chocolate, caffeine, pepper, spearmint and peppermint (see full list) to help prevent acid reflux.   Recommend counseling to help with stress regarding health concerns as stress can make reflux and overall pain even worse.   Recommend a multivitamin with 100% B12 and 1000 IU vitamin D. Recommend having vitamin D and B12 checked at next doctor visit.

## 2021-06-16 NOTE — Progress Notes (Signed)
Medical Nutrition Therapy:  Appt start time: 1610 end time:  9604.  Assessment:  Primary concerns today: Pt referred due to loss of weight, dysphagia. Pt present for appointment alone.  Pt reports last year she was hosptialized and told she has acid reflux. Then went back later and was recommended to have an endoscopy. Reports since the results she still has pain on left side of abdomen and sometimes chest pain. Went to ER last month regarding chest pain and was released with no abnormal heart findings. Reports was told her thyroid was out of range and has been referred to a specialist per pt report. Pt reports she fell down the steps in 2020 and was unable to get an xray at that time. Reports now having pain and numbness on left side since that accident. Pt reports having many health concerns and wanting to know what is causing her ongoing pain. Pt reports concerns chest pain may be concerning for something like breast cancer as well. Pt reports last labs in hospital showed low liver enzymes and she is concerned about that. Reports having to make herself eat and feeling like her throat gets swollen and like food is staying in her throat. Pt saw and SLP and had an MBS on 05/27/21 at which time SLP cleared pt for thin liquids and regular solids. Pt reports she weighed 187 lb prior to the beginning of health issues last year. Pt reports being very stressed due to health concerns. Pt reports she is open to seeing a counselor to help with her stress/anxiety.   Pt reports she has been trying to avoid foods that exacerbate her GERD but is unsure what is ok or not ok to eat. Pt works as Freight forwarder at [employer] and reports she can pack food for lunch at work and sometimes does but doesn't know of anything on the menu she can eat if needed. Pt usually skips lunch on work days and may do breakfast and dinner. Pt reports drinking 2 Ensures daily. Pt reports sleeping with pillows to prop her up at night to help prevent  reflux.   Pt reports being very stressed due to health concerns. Pt reports she is open to seeing a counselor to help with her stress/anxiety.   Food Allergies/Intolerances: None reported.   GI Concerns:acid reflux. Denies constipation or diarrhea.   Pertinent Lab Values: Pertinent labs within range.   Preferred Learning Style:  No preference indicated   Learning Readiness:  Ready  MEDICATIONS: Reviewed. See list.    DIETARY INTAKE:  Usual eating pattern includes 2 meals and 0 snacks per day.   Common foods: banana, Ensure.  Avoided foods: spicy foods, acidic and high fat foods.    Typical Snacks: None reported.     Typical Beverages: water, Body Armour, Vitamin Water.  Location of Meals: Breakfast in car on the way to work, other meals at home.   Electronics Present at [employer]: N/A  24-hr recall:  Mornings: banana, original Ensure, grits  Snack: None reported. Lunch: None if at work. If at home, chicken, canned greens Snack: None reported.  Dinner: may do chicken.  Snack 730: Ensure.  Usual physical activity: None reported.    Progress Towards Goal(s):  In progress.   Nutritional Diagnosis:  NI-2.1 Inadequate intake As related to dx GERD.  As evidenced by pt reports low appetite, concern about what to eat to prevent GERD.    Intervention:  Nutrition counseling provided. Dietitian provided education regarding nutrition education for GERD.  Discussed importance of consistent eating pattern to prevent GERD. Worked with pt to make eating plan and plan for appropriate foods for GERD. Discussed foods on work menu pt may include as well if unable to pack foods. Recommend including smooth foods that go down well and don't hurt throat. Highly recommend counseling to help with anxiety around health concerns. Discussed with pt how anxiety can make dealing with other concerns that much more difficult. Pt agreeable to counseling. Also recommend pt have vitamin D and B12 checked  to rule out any possible deficiencies. Pt agreeable with information/goals discussed.   Instructions/Goals:  Eat first meal within 1 hour of waking and eat after that time every 3-4 hours while awake.   Avoid eating within 3 hours of laying down.   Have a calm eating environment as much as possible. Eat slowly and chew to applesauce like texture.   Include smooth foods that go down well. At meals, include a grain/starch, a protein and a fruit/vegetable that is smooth. If unable to eat a meal, drink an Ensure.  Avoid spicy foods, acidic foods like tomatoes or citrus, high fat foods like whole milk or fried foods, chocolate, caffeine, pepper, spearmint and peppermint (see full list) to help prevent acid reflux.   Recommend counseling to help with stress regarding health concerns as stress can make reflux and overall pain even worse.   Recommend a multivitamin with 100% B12 and 1000 IU vitamin D. Recommend having vitamin D and B12 checked at next doctor visit.  Teaching Method Utilized:  Visual Auditory  Handouts given during visit include: Balanced plate and food list. GERD Nutrition Therapy   Barriers to learning/adherence to lifestyle change: GERD, ongoing pain/discomfort.   Demonstrated degree of understanding via:  Teach Back   Monitoring/Evaluation:  Dietary intake, exercise, and body weight in 6 week(s).

## 2021-06-17 ENCOUNTER — Other Ambulatory Visit (HOSPITAL_COMMUNITY): Payer: Self-pay

## 2021-06-17 MED ORDER — METHYLPREDNISOLONE 4 MG PO TBPK
ORAL_TABLET | ORAL | 0 refills | Status: DC
  Filled 2021-06-17: qty 21, 6d supply, fill #0

## 2021-06-21 ENCOUNTER — Other Ambulatory Visit: Payer: Self-pay

## 2021-06-21 ENCOUNTER — Ambulatory Visit: Payer: 59 | Attending: Family Medicine | Admitting: Family Medicine

## 2021-06-21 ENCOUNTER — Encounter: Payer: Self-pay | Admitting: Family Medicine

## 2021-06-21 VITALS — BP 111/74 | HR 90 | Ht 67.0 in | Wt 178.0 lb

## 2021-06-21 DIAGNOSIS — Z1159 Encounter for screening for other viral diseases: Secondary | ICD-10-CM | POA: Diagnosis not present

## 2021-06-21 DIAGNOSIS — R0789 Other chest pain: Secondary | ICD-10-CM

## 2021-06-21 DIAGNOSIS — Z13228 Encounter for screening for other metabolic disorders: Secondary | ICD-10-CM | POA: Diagnosis not present

## 2021-06-21 DIAGNOSIS — M5412 Radiculopathy, cervical region: Secondary | ICD-10-CM | POA: Diagnosis not present

## 2021-06-21 DIAGNOSIS — F419 Anxiety disorder, unspecified: Secondary | ICD-10-CM

## 2021-06-21 MED ORDER — PAROXETINE HCL 20 MG PO TABS
ORAL_TABLET | ORAL | 0 refills | Status: DC
  Filled 2021-06-21: qty 7, 14d supply, fill #0

## 2021-06-21 MED ORDER — DULOXETINE HCL 60 MG PO CPEP
60.0000 mg | ORAL_CAPSULE | Freq: Every day | ORAL | 3 refills | Status: DC
  Filled 2021-06-21: qty 30, 30d supply, fill #0

## 2021-06-21 NOTE — Patient Instructions (Signed)
Take Paxil 20 mg every other day for 7 days then on 3 days for the following 7 days then discontinue.  Begin Cymbalta 60 mg after the 2 weeks of Paxil taper.

## 2021-06-21 NOTE — Progress Notes (Signed)
Subjective:  Patient ID: Laurie Bell, female    DOB: [birth year]  Age: 41 y.o. MRN: [id number]  CC: New Patient (Initial Visit)   HPI Laurie Bell is a 41 y.o. year old female with a history of presents today to establish care.  Interval History: She complains of left sided pain from her neck all the way down to her leg. States she fell in 2020 and was unable to walk for 4 weeks and could not get an xray done due to her anxiety. Complains her L chestwalll, breast left back, left side of neck hurt and this has been present x2 months. Seen at Urgent Care twice and diagnosed with musculoskeletal pain. She was diagnosed with 'ulcers and acid reflux and placed on medication which has not been effective.  She has additional elective GI procedures coming up. Unable to take muscle relaxants which irritate her stomach.  On turning her neck she sometimes has L sided neck pain, numbness in L arm. Neuro has ordered an MRI to further evaluate her symptoms. Past Medical History:  Diagnosis Date   Anxiety    Chlamydia contact, treated    GERD (gastroesophageal reflux disease)     Past Surgical History:  Procedure Laterality Date   ENDOMETRIAL ABLATION N/A 04/25/2017   Procedure: ENDOMETRIAL ABLATION With NOVASURE;  Surgeon: Woodroe Mode, MD;  Location: [facility];  Service: Gynecology;  Laterality: N/A;   TUBAL LIGATION      Family History  Problem Relation Age of Onset   Hypertension Mother    Hypertension Maternal Aunt    Cancer Maternal Grandmother        type unknown    Allergies  Allergen Reactions   Latex Other (See Comments)    Burn in vaginal area with latex condoms    Outpatient Medications Prior to Visit  Medication Sig Dispense Refill   acetaminophen (TYLENOL) 500 MG tablet Take 1,000 mg by mouth every 6 (six) hours as needed for mild pain.     benzonatate (TESSALON) 100 MG capsule Take 1 capsule (100 mg total) by mouth 3 (three)  times  daily as needed for up to 10 days. 30 capsule 0   diclofenac Sodium (VOLTAREN) 1 % GEL Apply 2 g topically 4 (four) times daily. 100 g 0   gabapentin (NEURONTIN) 300 MG capsule Take 1 capsule (300 mg total) by mouth 2 (two) times daily. 90 capsule 3   hydrOXYzine (ATARAX) 25 MG tablet Take 1 tablet (25 mg total) by mouth 3 (three) times daily. 60 tablet 0   hyoscyamine (LEVSIN SL) 0.125 MG SL tablet Place 1 tablet (0.125 mg total) under the tongue every 6 (six) hours as needed. 40 tablet 0   megestrol (MEGACE) 40 MG tablet Take 1 tablet (40 mg total) by mouth daily. 30 tablet 0   methylPREDNISolone (MEDROL DOSEPAK) 4 MG TBPK tablet follow package directions 21 tablet 0   ondansetron (ZOFRAN-ODT) 4 MG disintegrating tablet Take 1 tablet (4 mg total) by mouth every 8 (eight) hours as needed for nausea or vomiting. 20 tablet 0   pantoprazole (PROTONIX) 40 MG tablet Take 1 tablet (40 mg total) by mouth 2 (two) times daily before a meal. 120 tablet 1   pantoprazole (PROTONIX) 40 MG tablet Take 1 tablet (40 mg total) by mouth 2 (two) times daily before a meal. 180 tablet 3   PARoxetine (PAXIL) 20 MG tablet Take 1 tablet (20 mg total) by mouth daily. 30 tablet 0  sucralfate (CARAFATE) 1 GM/10ML suspension Take 10 mLs (1 g total) by mouth 4 (four) times daily -  with meals and at bedtime. 1200 mL 2   tiZANidine (ZANAFLEX) 2 MG tablet Take 1/4-1 tablet by mouth up to 4 times a day 30 tablet 0   No facility-administered medications prior to visit.     ROS Review of Systems  Constitutional:  Negative for activity change, appetite change and fatigue.  HENT:  Negative for congestion, sinus pressure and sore throat.   Eyes:  Negative for visual disturbance.  Respiratory:  Negative for cough, chest tightness, shortness of breath and wheezing.   Cardiovascular:  Negative for chest pain and palpitations.  Gastrointestinal:  Negative for abdominal distention, abdominal pain and constipation.  Endocrine:  Negative for polydipsia.  Genitourinary:  Negative for dysuria and frequency.  Musculoskeletal:  Negative for arthralgias and back pain.       See HPI  Skin:  Negative for rash.  Neurological:  Positive for numbness. Negative for tremors and light-headedness.  Hematological:  Does not bruise/bleed easily.  Psychiatric/Behavioral:  Negative for agitation and behavioral problems.    Objective:  BP 111/74   Pulse 90   Ht 5\' 7"  (1.702 m)   Wt 178 lb (80.7 kg)   SpO2 100%   BMI 27.88 kg/m   BP/Weight 06/21/2021 06/10/2021 07/37/1062  Systolic BP 694 94 -  Diastolic BP 74 78 -  Wt. (Lbs) 178 - 174  BMI 27.88 27.25 -      Physical Exam Constitutional:      Appearance: She is well-developed.  Cardiovascular:     Rate and Rhythm: Normal rate.     Heart sounds: Normal heart sounds. No murmur heard. Pulmonary:     Effort: Pulmonary effort is normal.     Breath sounds: Normal breath sounds. No wheezing or rales.     Comments: Left chest wall tenderness Chest:     Chest wall: No tenderness.  Breasts:    Right: Normal. No mass or tenderness.     Left: Normal. No mass or tenderness.  Abdominal:     General: Bowel sounds are normal. There is no distension.     Palpations: Abdomen is soft. There is no mass.     Tenderness: There is no abdominal tenderness.  Musculoskeletal:        General: Normal range of motion.     Cervical back: Tenderness (L side) present.     Right lower leg: No edema.     Left lower leg: No edema.     Comments: Normal handgrip bilaterally  Neurological:     Mental Status: She is alert and oriented to person, place, and time.  Psychiatric:        Mood and Affect: Mood normal.    CMP Latest Ref Rng & Units 06/10/2021 06/09/2021 05/12/2021  Glucose 70 - 99 mg/dL - 96 95  BUN 6 - 20 mg/dL - 9 11  Creatinine 0.44 - 1.00 mg/dL - 0.81 0.84  Sodium 135 - 145 mmol/L - 137 133(L)  Potassium 3.5 - 5.1 mmol/L - 3.6 3.6  Chloride 98 - 111 mmol/L - 104 102  CO2  22 - 32 mmol/L - 25 25  Calcium 8.9 - 10.3 mg/dL - 9.8 9.1  Total Protein 6.5 - 8.1 g/dL 7.4 - 8.1  Total Bilirubin 0.3 - 1.2 mg/dL 0.5 - 0.6  Alkaline Phos 38 - 126 U/L 87 - 96  AST 15 - 41 U/L 12(L) - 19  ALT 0 - 44 U/L 16 - 19    Lipid Panel  No results found for: CHOL, TRIG, HDL, CHOLHDL, VLDL, LDLCALC, LDLDIRECT  CBC    Component Value Date/Time   WBC 9.8 06/09/2021 2056   RBC 4.64 06/09/2021 2056   HGB 13.3 06/09/2021 2056   HCT 39.4 06/09/2021 2056   PLT 294 06/09/2021 2056   MCV 84.9 06/09/2021 2056   MCH 28.7 06/09/2021 2056   MCHC 33.8 06/09/2021 2056   RDW 13.2 06/09/2021 2056   LYMPHSABS 3.6 05/12/2021 2219   MONOABS 0.7 05/12/2021 2219   EOSABS 0.3 05/12/2021 2219   BASOSABS 0.1 05/12/2021 2219    Lab Results  Component Value Date   HGBA1C  12/24/2007    5.2 (NOTE)   The ADA recommends the following therapeutic goal for glycemic   control related to Hgb A1C measurement:   Goal of Therapy:   < 7.0% Hgb A1C   Reference: American Diabetes Association: Clinical Practice   Recommendations 2008, Diabetes Care,  2008, 31:(Suppl 1).    Assessment & Plan:  1. Cervical radiculopathy Uncontrolled Will initiate Cymbalta - DULoxetine (CYMBALTA) 60 MG capsule; Take 1 capsule (60 mg total) by mouth daily. Beginning 2 weeks after completing taper of Paxil.  Dispense: 30 capsule; Refill: 3 - Ambulatory referral to Physical Therapy  2. Chest wall pain Unable to tolerate oral NSAIDs due to GI issues Topical NSAIDs might be beneficial - DULoxetine (CYMBALTA) 60 MG capsule; Take 1 capsule (60 mg total) by mouth daily. Beginning 2 weeks after completing taper of Paxil.  Dispense: 30 capsule; Refill: 3  3. Screening for metabolic disorder - MM 3D SCREEN BREAST BILATERAL; Future - Vitamin D, 25-hydroxy - Vitamin B12  4. Need for hepatitis C screening test - HCV Ab w Reflex to Quant PCR  5. Anxiety Due to concomitant presence of pain I have tapered her off Paxil and  will initiate Cymbalta which will be beneficial with regards to analgesic benefit - PARoxetine (PAXIL) 20 MG tablet; Take 1 tablet (20 mg total) by mouth every other day for 7 days, THEN 1 tablet (20 mg total) 3 (three) times a week for 7 days. Then stop.  Dispense: 15 tablet; Refill: 0    No orders of the defined types were placed in this encounter.   Return in about 3 months (around 09/19/2021) for Chronic medical conditions.       Charlott Rakes, MD, FAAFP. [facility] [place], [place]   06/21/2021, 10:14 AM

## 2021-06-21 NOTE — Progress Notes (Signed)
Pain on left side of body after fall in 2020. Has seen Neurology did not get MRI done yet. Left breast pain.

## 2021-06-22 LAB — VITAMIN D 25 HYDROXY (VIT D DEFICIENCY, FRACTURES): Vit D, 25-Hydroxy: 37.7 ng/mL (ref 30.0–100.0)

## 2021-06-22 LAB — HCV INTERPRETATION

## 2021-06-22 LAB — VITAMIN B12: Vitamin B-12: 1316 pg/mL — ABNORMAL HIGH (ref 232–1245)

## 2021-06-22 LAB — HCV AB W REFLEX TO QUANT PCR: HCV Ab: 0.1 s/co ratio (ref 0.0–0.9)

## 2021-06-23 ENCOUNTER — Encounter: Payer: Self-pay | Admitting: Internal Medicine

## 2021-06-23 ENCOUNTER — Ambulatory Visit (AMBULATORY_SURGERY_CENTER): Payer: 59 | Admitting: Internal Medicine

## 2021-06-23 ENCOUNTER — Other Ambulatory Visit: Payer: Self-pay

## 2021-06-23 VITALS — BP 112/75 | HR 71 | Temp 98.6°F | Resp 16 | Ht 67.0 in | Wt 179.0 lb

## 2021-06-23 DIAGNOSIS — K219 Gastro-esophageal reflux disease without esophagitis: Secondary | ICD-10-CM

## 2021-06-23 DIAGNOSIS — K225 Diverticulum of esophagus, acquired: Secondary | ICD-10-CM

## 2021-06-23 MED ORDER — SODIUM CHLORIDE 0.9 % IV SOLN: 500.0000 mL | Freq: Once | INTRAVENOUS | Status: DC

## 2021-06-23 NOTE — Progress Notes (Signed)
To pacu, VSS. Report to [email or web address] 

## 2021-06-23 NOTE — Patient Instructions (Addendum)
Thank you for allowing [place] to care for you today! Resume previous diet and medications today.   Continue to take Pantoprazole for stomach acid control. Continue to eat sitting up-right and not going to bed within 3 hours of a meal.   YOU HAD AN ENDOSCOPIC PROCEDURE TODAY AT [facility]:   Refer to the procedure report that was given to you for any specific questions about what was found during the examination.  If the procedure report does not answer your questions, please call your gastroenterologist to clarify.  If you requested that your care partner not be given the details of your procedure findings, then the procedure report has been included in a sealed envelope for you to review at your convenience later.  YOU SHOULD EXPECT: Some feelings of bloating in the abdomen. Passage of more gas than usual.  Walking can help get rid of the air that was put into your GI tract during the procedure and reduce the bloating. If you had a lower endoscopy (such as a colonoscopy or flexible sigmoidoscopy) you may notice spotting of blood in your stool or on the toilet paper. If you underwent a bowel prep for your procedure, you may not have a normal bowel movement for a few days.  Please Note:  You might notice some irritation and congestion in your nose or some drainage.  This is from the oxygen used during your procedure.  There is no need for concern and it should clear up in a day or so.  SYMPTOMS TO REPORT IMMEDIATELY:   Following upper endoscopy (EGD)  Vomiting of blood or coffee ground material  New chest pain or pain under the shoulder blades  Painful or persistently difficult swallowing  New shortness of breath  Fever of 100F or higher  Black, tarry-looking stools  For urgent or emergent issues, a gastroenterologist can be reached at any hour by calling [phone]. Do not use MyChart messaging for urgent concerns.    DIET:  We do recommend a small meal at first, but  then you may proceed to your regular diet.  Drink plenty of fluids but you should avoid alcoholic beverages for 24 hours.  ACTIVITY:  You should plan to take it easy for the rest of today and you should NOT DRIVE or use heavy machinery until tomorrow (because of the sedation medicines used during the test).    FOLLOW UP: Our staff will call the number listed on your records 48-72 hours following your procedure to check on you and address any questions or concerns that you may have regarding the information given to you following your procedure. If we do not reach you, we will leave a message.  We will attempt to reach you two times.  During this call, we will ask if you have developed any symptoms of COVID 19. If you develop any symptoms (ie: fever, flu-like symptoms, shortness of breath, cough etc.) before then, please call [phone].  If you test positive for Covid 19 in the 2 weeks post procedure, please call and report this information to [place].    If any biopsies were taken you will be contacted by phone or by letter within the next 1-3 weeks.  Please call us at [phone] if you have not heard about the biopsies in 3 weeks.    SIGNATURES/CONFIDENTIALITY: You and/or your care partner have signed paperwork which will be entered into your electronic medical record.  These signatures attest to the fact that that  the information above on your After Visit Summary has been reviewed and is understood.  Full responsibility of the confidentiality of this discharge information lies with you and/or your care-partner.

## 2021-06-23 NOTE — Progress Notes (Signed)
GASTROENTEROLOGY PROCEDURE H&P NOTE   Primary Care Physician: Dorna Mai, MD    Reason for Procedure:   Follow up of gastric ulcers  Plan:    EGD  Patient is appropriate for endoscopic procedure(s) in the ambulatory ([facility]) setting.  The nature of the procedure, as well as the risks, benefits, and alternatives were carefully and thoroughly reviewed with the patient. Ample time for discussion and questions allowed. The patient understood, was satisfied, and agreed to proceed.     HPI: Laurie Bell is a 41 y.o. female who presents for EGD for follow up of gastric ulcers.   Past Medical History:  Diagnosis Date   Anxiety    Arthritis    Chlamydia contact, treated    GERD (gastroesophageal reflux disease)     Past Surgical History:  Procedure Laterality Date   ENDOMETRIAL ABLATION N/A 04/25/2017   Procedure: ENDOMETRIAL ABLATION With NOVASURE;  Surgeon: Woodroe Mode, MD;  Location: [facility];  Service: Gynecology;  Laterality: N/A;   TUBAL LIGATION      Prior to Admission medications   Medication Sig Start Date End Date Taking? Authorizing Provider  acetaminophen (TYLENOL) 500 MG tablet Take 1,000 mg by mouth every 6 (six) hours as needed for mild pain.   Yes [provider]  diclofenac Sodium (VOLTAREN) 1 % GEL Apply 2 g topically 4 (four) times daily. 06/10/21  Yes Rancour, Annie Main, MD  DULoxetine (CYMBALTA) 60 MG capsule Take 1 capsule (60 mg total) by mouth daily. Beginning 2 weeks after completing taper of Paxil. 06/21/21  Yes Charlott Rakes, MD  hydrOXYzine (ATARAX) 25 MG tablet Take 1 tablet (25 mg total) by mouth 3 (three) times daily. 05/31/21  Yes Dorna Mai, MD  megestrol (MEGACE) 40 MG tablet Take 1 tablet (40 mg total) by mouth daily. 05/31/21  Yes Dorna Mai, MD  ondansetron (ZOFRAN-ODT) 4 MG disintegrating tablet Take 1 tablet (4 mg total) by mouth every 8 (eight) hours as needed for nausea or vomiting. 05/13/21  Yes  Caccavale, Sophia, PA-C  pantoprazole (PROTONIX) 40 MG tablet Take 1 tablet (40 mg total) by mouth 2 (two) times daily before a meal. 04/28/21  Yes Sharyn Creamer, MD  PARoxetine (PAXIL) 20 MG tablet Take 1 tablet (20 mg total) by mouth every other day for 7 days, THEN 1 tablet (20 mg total) 3 (three) times a week for 7 days. Then stop. 06/21/21 07/05/21 Yes Newlin, Charlane Ferretti, MD  sucralfate (CARAFATE) 1 GM/10ML suspension Take 10 mLs (1 g total) by mouth 4 (four) times daily -  with meals and at bedtime. 05/25/21  Yes Esterwood, Amy S, PA-C  tiZANidine (ZANAFLEX) 2 MG tablet Take 1/4-1 tablet by mouth up to 4 times a day 05/26/21  Yes   benzonatate (TESSALON) 100 MG capsule Take 1 capsule (100 mg total) by mouth 3 (three)  times daily as needed for up to 10 days. 05/19/21     gabapentin (NEURONTIN) 300 MG capsule Take 1 capsule (300 mg total) by mouth 2 (two) times daily. 05/02/21   [name], NP  hyoscyamine (LEVSIN SL) 0.125 MG SL tablet Place 1 tablet (0.125 mg total) under the tongue every 6 (six) hours as needed. 05/25/21   Esterwood, Amy S, PA-C  methylPREDNISolone (MEDROL DOSEPAK) 4 MG TBPK tablet follow package directions Patient not taking: Reported on 06/23/2021 06/16/21     medroxyPROGESTERone (PROVERA) 10 MG tablet Take 2 tablets by mouth daily Patient not taking: Reported on 06/05/2019 02/06/18  02/02/20  Sloan Leiter, MD    Current Outpatient Medications  Medication Sig Dispense Refill   acetaminophen (TYLENOL) 500 MG tablet Take 1,000 mg by mouth every 6 (six) hours as needed for mild pain.     diclofenac Sodium (VOLTAREN) 1 % GEL Apply 2 g topically 4 (four) times daily. 100 g 0   DULoxetine (CYMBALTA) 60 MG capsule Take 1 capsule (60 mg total) by mouth daily. Beginning 2 weeks after completing taper of Paxil. 30 capsule 3   hydrOXYzine (ATARAX) 25 MG tablet Take 1 tablet (25 mg total) by mouth 3 (three) times daily. 60 tablet 0   megestrol (MEGACE) 40 MG tablet Take 1 tablet (40  mg total) by mouth daily. 30 tablet 0   ondansetron (ZOFRAN-ODT) 4 MG disintegrating tablet Take 1 tablet (4 mg total) by mouth every 8 (eight) hours as needed for nausea or vomiting. 20 tablet 0   pantoprazole (PROTONIX) 40 MG tablet Take 1 tablet (40 mg total) by mouth 2 (two) times daily before a meal. 120 tablet 1   PARoxetine (PAXIL) 20 MG tablet Take 1 tablet (20 mg total) by mouth every other day for 7 days, THEN 1 tablet (20 mg total) 3 (three) times a week for 7 days. Then stop. 15 tablet 0   sucralfate (CARAFATE) 1 GM/10ML suspension Take 10 mLs (1 g total) by mouth 4 (four) times daily -  with meals and at bedtime. 1200 mL 2   tiZANidine (ZANAFLEX) 2 MG tablet Take 1/4-1 tablet by mouth up to 4 times a day 30 tablet 0   benzonatate (TESSALON) 100 MG capsule Take 1 capsule (100 mg total) by mouth 3 (three)  times daily as needed for up to 10 days. 30 capsule 0   gabapentin (NEURONTIN) 300 MG capsule Take 1 capsule (300 mg total) by mouth 2 (two) times daily. 90 capsule 3   hyoscyamine (LEVSIN SL) 0.125 MG SL tablet Place 1 tablet (0.125 mg total) under the tongue every 6 (six) hours as needed. 40 tablet 0   methylPREDNISolone (MEDROL DOSEPAK) 4 MG TBPK tablet follow package directions (Patient not taking: Reported on 06/23/2021) 21 tablet 0   Current Facility-Administered Medications  Medication Dose Route Frequency Provider Last Rate Last Admin   0.9 %  sodium chloride infusion  500 mL Intravenous Once Sharyn Creamer, MD        Allergies as of 06/23/2021 - Review Complete 06/23/2021  Allergen Reaction Noted   Latex Other (See Comments) 04/18/2017    Family History  Problem Relation Age of Onset   Hypertension Mother    Hypertension Maternal Aunt    Cancer Maternal Grandmother        type unknown   Esophageal cancer Neg Hx    Rectal cancer Neg Hx    Stomach cancer Neg Hx    Colon cancer Neg Hx     Social History   Socioeconomic History   Marital status: Single    Spouse  name: Not on file   Number of children: 2   Years of education: Not on file   Highest education level: Not on file  Occupational History   Occupation: Best boy: [employer]  Tobacco Use   Smoking status: Never   Smokeless tobacco: Never  Vaping Use   Vaping Use: Never used  Substance and Sexual Activity   Alcohol use: No   Drug use: Not Currently    Types: Marijuana    Comment: quit  2018   Sexual activity: Not Currently    Birth control/protection: Surgical  Other Topics Concern   Not on file  Social History Narrative   Right Handed    Lives in a one story home   Social Determinants of Health   Financial Resource Strain: Not on file  Food Insecurity: No Food Insecurity   Worried About Charity fundraiser in the Last Year: Never true   Ran Out of Food in the Last Year: Never true  Transportation Needs: Not on file  Physical Activity: Not on file  Stress: Not on file  Social Connections: Not on file  Intimate Partner Violence: Not on file    Physical Exam: Vital signs in last 24 hours: BP (!) 113/58   Pulse 82   Temp 98.6 F (37 C)   Ht 5\' 7"  (1.702 m)   Wt 179 lb (81.2 kg)   LMP 05/30/2021 (Exact Date)   SpO2 98%   BMI 28.04 kg/m  GEN: NAD EYE: Sclerae anicteric ENT: MMM CV: Non-tachycardic Pulm: No increased work of breathing GI: Soft, NT/ND NEURO:  Alert & Oriented   Christia Reading, MD [facility]  06/23/2021 10:38 AM

## 2021-06-23 NOTE — Op Note (Signed)
[facility] Patient Name: Laurie Bell Procedure Date: 06/23/2021 10:45 AM MRN: [id number] Endoscopist: Sonny Masters "Laurie Bell ,  Age: 41 Referring MD:  Date of Birth: July 24, 1980 Gender: Female Account #: [id number] Procedure:                Upper GI endoscopy Indications:              Follow-up of acute gastric ulcer Medicines:                Monitored Anesthesia Care Procedure:                Pre-Anesthesia Assessment:                           - Prior to the procedure, a History and Physical                            was performed, and patient medications and                            allergies were reviewed. The patient's tolerance of                            previous anesthesia was also reviewed. The risks                            and benefits of the procedure and the sedation                            options and risks were discussed with the patient.                            All questions were answered, and informed consent                            was obtained. Prior Anticoagulants: The patient has                            taken no previous anticoagulant or antiplatelet                            agents. ASA Grade Assessment: II - A patient with                            mild systemic disease. After reviewing the risks                            and benefits, the patient was deemed in                            satisfactory condition to undergo the procedure.                           After obtaining informed consent, the endoscope was  passed under direct vision. Throughout the                            procedure, the patient's blood pressure, pulse, and                            oxygen saturations were monitored continuously. The                            Endoscope was introduced through the mouth, and                            advanced to the second part of duodenum. The upper                            GI endoscopy  was accomplished without difficulty.                            The patient tolerated the procedure well. Scope In: Scope Out: Findings:                 A non-bleeding diverticulum with a small opening                            and no stigmata of recent bleeding was found at the                            cricopharyngeus.                           The entire examined stomach was normal.                           The examined duodenum was normal. Complications:            No immediate complications. Estimated Blood Loss:     Estimated blood loss: none. 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                           - Normal stomach.                           - Normal examined duodenum.                           - No specimens collected. Recommendation:           - Discharge patient to home (with escort).                           - Your previously visualized gastric ulcers have                            healed.                           -  I will follow up with the results of your                            esophageal manometry/pH impedance study.                           - The findings and recommendations were discussed                            with the patient. Sonny Masters "Laurie Bell,  06/23/2021 11:01:21 AM

## 2021-06-25 ENCOUNTER — Other Ambulatory Visit: Payer: Self-pay

## 2021-06-25 ENCOUNTER — Ambulatory Visit
Admission: RE | Admit: 2021-06-25 | Discharge: 2021-06-25 | Disposition: A | Discharge status: Home / Self Care | Payer: Self-pay | Source: Ambulatory Visit | Attending: Neurology | Admitting: Neurology

## 2021-06-25 MED ORDER — GADOBENATE DIMEGLUMINE 529 MG/ML IV SOLN
16.0000 mL | Freq: Once | INTRAVENOUS | Status: AC | PRN
  Administered 2021-06-25: 16 mL via INTRAVENOUS

## 2021-06-27 ENCOUNTER — Other Ambulatory Visit: Payer: Self-pay | Admitting: Family Medicine

## 2021-06-27 ENCOUNTER — Other Ambulatory Visit: Payer: Self-pay

## 2021-06-27 ENCOUNTER — Telehealth: Payer: Self-pay

## 2021-06-27 NOTE — Telephone Encounter (Signed)
I spoke With Dr. Lorenso Courier in regards to follow up call to pt from her EGD on 06/23/21.  Per Dr. Lorenso Courier she did not want to change pt's medication until pt has manometry and the results to review.  Pt said she understood and thanked me for calling her back.

## 2021-06-27 NOTE — Telephone Encounter (Signed)
  Follow up Call-  Call back number 06/23/2021 04/28/2021  Post procedure Call Back phone  # [phone]  Permission to leave phone message Yes Yes  Some recent data might be hidden     Patient questions:  Do you have a fever, pain , or abdominal swelling? No. Pain Score  0 *  Have you tolerated food without any problems? No.  Have you been able to return to your normal activities? Yes.    Do you have any questions about your discharge instructions: Diet   No. Medications  No. Follow up visit  No.  Do you have questions or concerns about your Care? Yes.     Pt reported she is still having the sensation that something is in her throat and has to make herself burp to help relieve the feeling.  Pt asked if you could change her Protonix to something else to see if it will help her sx.  Pt has a manometery scheduled.  I told pt I would send this note to Dr. Lorenso Courier, but she may want to have the study results before changing medication.        Actions: * If pain score is 4 or above: No action needed, pain <4.      Have you developed a fever since your procedure? no  2.   Have you had an respiratory symptoms (SOB or cough) since your procedure? no  3.   Have you tested positive for COVID 19 since your procedure no  4.   Have you had any family members/close contacts diagnosed with the COVID 19 since your procedure?  no   If yes to any of these questions please route to Joylene John, RN and Joella Prince, RN

## 2021-06-28 ENCOUNTER — Other Ambulatory Visit: Payer: Self-pay

## 2021-06-28 ENCOUNTER — Ambulatory Visit: Payer: Self-pay | Admitting: Registered"

## 2021-06-28 DIAGNOSIS — R200 Anesthesia of skin: Secondary | ICD-10-CM

## 2021-06-28 DIAGNOSIS — R202 Paresthesia of skin: Secondary | ICD-10-CM

## 2021-06-28 MED ORDER — HYDROXYZINE HCL 25 MG PO TABS
25.0000 mg | ORAL_TABLET | Freq: Three times a day (TID) | ORAL | 0 refills | Status: DC
  Filled 2021-06-28 (×2): qty 60, 20d supply, fill #0

## 2021-06-29 ENCOUNTER — Other Ambulatory Visit: Payer: Self-pay

## 2021-06-30 ENCOUNTER — Ambulatory Visit: Payer: Self-pay

## 2021-06-30 NOTE — Telephone Encounter (Signed)
2nd attempt, Pt called, unable to leave VM d/t mailbox not being set up. Will attempt again later.

## 2021-06-30 NOTE — Telephone Encounter (Signed)
  Chief Complaint: medication request- triaged back pain for provider information Symptoms: seen in office 1/10 and prescribed Cymbalta- patient has not started due to tapering off Paxil. Patient is requesting medication for her back pain.  Frequency: constant pain Pertinent Negatives: Patient denies   Disposition: [] ED /[] Urgent Care (no appt availability in office) / [] Appointment(In office/virtual)/ []  [place] [facility]/ [x] Home Care/ [] Refused Recommended Disposition /[] [place] [facility]/ []  Follow-up with PCP Additional Notes: Patient was seen 06/21/21 for back pain- patient is requesting medication for her pain.   Answer Assessment - Initial Assessment Questions 1. ONSET: "When did the pain begin?"      Chronic back pain 2. LOCATION: "Where does it hurt?" (upper, mid or lower back)     Left- mid back to feet 3. SEVERITY: "How bad is the pain?"  (e.g., Scale 1-10; mild, moderate, or severe)   - MILD (1-3): doesn't interfere with normal activities    - MODERATE (4-7): interferes with normal activities or awakens from sleep    - SEVERE (8-10): excruciating pain, unable to do any normal activities      Mild/moderate 4. PATTERN: "Is the pain constant?" (e.g., yes, no; constant, intermittent)      constant 5. RADIATION: "Does the pain shoot into your legs or elsewhere?"     Down into leg 6. CAUSE:  "What do you think is causing the back pain?"      Pain started in November 7. BACK OVERUSE:  "Any recent lifting of heavy objects, strenuous work or exercise?"     No- diagnosed muscle strain  8. MEDICATIONS: "What have you taken so far for the pain?" (e.g., nothing, acetaminophen, NSAIDS)     Tylenol  Protocols used: Back Pain-A-AH

## 2021-06-30 NOTE — Telephone Encounter (Signed)
Pt called, unable to leave VM d/t mailbox not being set up. Will attempt again later.  Summary: back pains   Pt states that she having bad pains on left side of back and breast  and wants to see if she can be prescribed something, seeking clinical advise

## 2021-07-01 ENCOUNTER — Ambulatory Visit: Payer: Self-pay | Admitting: Neurology

## 2021-07-04 ENCOUNTER — Ambulatory Visit: Payer: Self-pay | Admitting: Neurology

## 2021-07-07 ENCOUNTER — Telehealth: Payer: Self-pay | Admitting: Pharmacist

## 2021-07-07 NOTE — Telephone Encounter (Signed)
Sorry to make a new thread. I do not have access to nurse triage notes for some reason. Duloxetine is ready at our pharmacy. She is supposed to start this for her pain. Once she starts this, I recommend she call back after a couple of weeks on the medication if her pain has not improved.

## 2021-07-07 NOTE — Telephone Encounter (Addendum)
Pt called to report that the duloxetine is making her constipated and she is falling asleep at her job. She says it hurts her stomach, and now she has this in addition to her anxiety. Please advise

## 2021-07-07 NOTE — Telephone Encounter (Signed)
Voicemail box not set up. Unable to reach patient.

## 2021-07-07 NOTE — Telephone Encounter (Signed)
Pt called and stated that she would like a call back from the nurse to discuss medication. Please advise

## 2021-07-08 ENCOUNTER — Ambulatory Visit: Payer: Self-pay | Admitting: *Deleted

## 2021-07-08 NOTE — Telephone Encounter (Signed)
   Chief Complaint: requesting different medication  Symptoms: pain in left upper side, chest, acid reflux . Cymbalta makes her sleepy and can not take during the day  Frequency: since Nov Pertinent Negatives: Patient denies chest pain, difficulty breathing Disposition: [] ED /[x] Urgent Care (no appt availability in office) / [] Appointment(In office/virtual)/ [x]  [place]/ [] Home Care/ [] Refused Recommended Disposition /[] [place] [facility]/ []  Follow-up with PCP Additional Notes:   Please advise      Reason for Disposition  MODERATE pain (e.g., interferes with normal activities or awakens from sleep)  Answer Assessment - Initial Assessment Questions 1. LOCATION: "Where does it hurt?" (e.g., left, right)     Left chest , left ribs, possible acid reflux  2. ONSET: "When did the pain start?"     Since November 3. SEVERITY: "How bad is the pain?" (e.g., Scale 1-10; mild, moderate, or severe)   - MILD (1-3): doesn't interfere with normal activities    - MODERATE (4-7): interferes with normal activities or awakens from sleep    - SEVERE (8-10): excruciating pain and patient unable to do normal activities (stays in bed)       Constant  4. PATTERN: "Does the pain come and go, or is it constant?"      na 5. CAUSE: "What do you think is causing the pain?"     na 6. OTHER SYMPTOMS:  "Do you have any other symptoms?" (e.g., fever, abdominal pain, vomiting, leg weakness, burning with urination, blood in urine)     Pain during the day unable to take Cymbalta due to SE sleepiness 7. PREGNANCY:  "Is there any chance you are pregnant?" "When was your last menstrual period?"     na  Protocols used: Flank Pain-A-AH

## 2021-07-11 ENCOUNTER — Telehealth: Payer: Self-pay | Admitting: Family Medicine

## 2021-07-11 ENCOUNTER — Ambulatory Visit: Payer: Self-pay

## 2021-07-11 ENCOUNTER — Other Ambulatory Visit (HOSPITAL_COMMUNITY): Payer: Self-pay

## 2021-07-11 DIAGNOSIS — G8929 Other chronic pain: Secondary | ICD-10-CM

## 2021-07-11 MED ORDER — TIZANIDINE HCL 2 MG PO CAPS
2.0000 mg | ORAL_CAPSULE | Freq: Four times a day (QID) | ORAL | 0 refills | Status: DC
  Filled 2021-07-11: qty 60, 15d supply, fill #0

## 2021-07-11 MED ORDER — AMOXICILLIN 875 MG PO TABS
875.0000 mg | ORAL_TABLET | Freq: Two times a day (BID) | ORAL | 0 refills | Status: DC
  Filled 2021-07-11: qty 20, 10d supply, fill #0

## 2021-07-11 NOTE — Telephone Encounter (Signed)
   Chief Complaint: Left breast pain Symptoms: Back and shoulder Frequency: Started 2 months ago. May be a pinched nerve Pertinent Negatives: Patient denies mobility issues Disposition: [] ED /[] Urgent Care (no appt availability in office) / [] Appointment(In office/virtual)/ []  [place]/ [] Home Care/ [] Refused Recommended Disposition /[] [facility]/ [x]  Follow-up with PCP Additional Notes: Pt. Reports she is still having left breast pain that hurts into shoulder and back. States "my doctor knows about it." "I want to know if there is anything else she can do about this. My grandmother died from breast cancer so I'm worried about it." Please advise pt.   Answer Assessment - Initial Assessment Questions 1. SYMPTOM: "What's the main symptom you're concerned about?"  (e.g., lump, pain, rash, nipple discharge)     Left side breast pain 2. LOCATION: "Where is the pain located?"     Left breast 3. ONSET: "When did pain   start?"     2 months ago 4. PRIOR HISTORY: "Do you have any history of prior problems with your breasts?" (e.g., lumps, cancer, fibrocystic breast disease)     Yes 5. CAUSE: "What do you think is causing this symptom?"     Unsure 6. OTHER SYMPTOMS: "Do you have any other symptoms?" (e.g., fever, breast pain, redness or rash, nipple discharge)     Shoulder and back pain 7. PREGNANCY-BREASTFEEDING: "Is there any chance you are pregnant?" "When was your last menstrual period?" "Are you breastfeeding?"     No  Protocols used: Breast Symptoms-A-AH

## 2021-07-11 NOTE — Telephone Encounter (Signed)
If Cymbalta makes her sleepy she can discontinue it and continue gabapentin and tizanidine which she previously received from another clinician.  For her reflux symptoms she is currently under the care of GI and is being scheduled for an esophageal manometry on I would recommend she inform them about her symptoms.

## 2021-07-11 NOTE — Telephone Encounter (Signed)
Patient called stating she needs to be seen ASAP. She is experiencing severe pain in her breast and back. She is unable to do daily task because of the pain. She was given a medication but it makes her sleepy so she cant take it during the day. She would like a different medication. She would like to be seen in the office.

## 2021-07-11 NOTE — Telephone Encounter (Signed)
Copied from [place] [id number]. Topic: General - Other >> Jul 08, 2021 10:17 AM Laurie Bell A wrote: Reason for CRM: The patient would like to speak with a member of pharmacy staff when available  The patient has returned the missed call yesterday from Dr. Lurena Joiner  Please contact further when available

## 2021-07-11 NOTE — Telephone Encounter (Signed)
Patient is looking for pain medication and reports that the newly prescribed duloxetine is not working. Since this is a therapeutic switch it would have to made by her PCP. Will route to Dr. Margarita Rana. It is likely that the patient would need to be seen.

## 2021-07-11 NOTE — Telephone Encounter (Signed)
Copied from [place] [id number] [id number]. Topic: General - Call Back - No Documentation >> Jul 08, 2021  3:13 PM Erick Blinks wrote: Reason for CRM: Pt called to discuss her mammogram results, please advise if [facility] may disclose.  Best contact: [phone]

## 2021-07-12 ENCOUNTER — Other Ambulatory Visit: Payer: Self-pay

## 2021-07-12 MED ORDER — LIDOCAINE 5 % EX PTCH
1.0000 | MEDICATED_PATCH | CUTANEOUS | 0 refills | Status: DC
  Filled 2021-07-12: qty 30, 30d supply, fill #0

## 2021-07-12 NOTE — Addendum Note (Signed)
Addended byCharlott Rakes on: 07/12/2021 08:30 AM   Modules accepted: Orders

## 2021-07-12 NOTE — Telephone Encounter (Signed)
Patient called and informed of the plan.

## 2021-07-12 NOTE — Telephone Encounter (Signed)
Called pt at [phone] unable to reach or leave VM at this time.

## 2021-07-12 NOTE — Telephone Encounter (Signed)
Patient is no longer seeing Dr. Redmond Pulling

## 2021-07-12 NOTE — Telephone Encounter (Signed)
Pt was called and there is no VM set up to leave a message. 

## 2021-07-12 NOTE — Telephone Encounter (Signed)
Pt returned phone call and was given appointment for tomorrow.

## 2021-07-12 NOTE — Telephone Encounter (Signed)
At the moment she is on gabapentin and tizanidine already.  She is unable to tolerate NSAIDs due to GI adverse effects and so I have sent a prescription for Lidoderm patch to the pharmacy and referred her to pain management.

## 2021-07-13 ENCOUNTER — Other Ambulatory Visit: Payer: Self-pay

## 2021-07-13 ENCOUNTER — Ambulatory Visit: Payer: 59 | Attending: Family Medicine | Admitting: Family Medicine

## 2021-07-13 ENCOUNTER — Encounter: Payer: Self-pay | Admitting: Family Medicine

## 2021-07-13 VITALS — BP 114/73 | HR 71 | Ht 67.0 in | Wt 179.6 lb

## 2021-07-13 DIAGNOSIS — F419 Anxiety disorder, unspecified: Secondary | ICD-10-CM

## 2021-07-13 DIAGNOSIS — M5412 Radiculopathy, cervical region: Secondary | ICD-10-CM | POA: Diagnosis not present

## 2021-07-13 DIAGNOSIS — M62838 Other muscle spasm: Secondary | ICD-10-CM

## 2021-07-13 MED ORDER — FLUOXETINE HCL 20 MG PO CAPS
20.0000 mg | ORAL_CAPSULE | Freq: Every day | ORAL | 3 refills | Status: DC
  Filled 2021-07-13: qty 30, 30d supply, fill #0

## 2021-07-13 NOTE — Patient Instructions (Signed)
Cervical Radiculopathy ?Cervical radiculopathy means that a nerve in the neck (a cervical nerve) is pinched or bruised. This can happen because of an injury to the cervical spine (vertebrae) in the neck, or as a normal part of getting older. This condition can cause pain or loss of feeling (numbness) that runs from your neck all the way down to your arm and fingers. Often, this condition gets better with rest. Treatment may be needed if the condition does not get better. ?What are the causes? ?A neck injury. ?A bulging disk in your spine. ?Sudden muscle tightening (muscle spasms). ?Tight muscles in your neck due to overuse. ?Arthritis. ?Breakdown in the bones and joints of the spine (spondylosis) due to getting older. ?Bone spurs that form near the nerves in the neck. ?What are the signs or symptoms? ?Pain. The pain may: ?Run from the neck to the arm and hand. ?Be very bad or irritating. ?Get worse when you move your neck. ?Loss of feeling or tingling in your arm or hand. ?Weakness in your arm or hand, in very bad cases. ?How is this treated? ?In many cases, treatment is not needed for this condition. With rest, the condition often gets better over time. If treatment is needed, options may include: ?Wearing a soft neck collar (cervical collar) for short periods of time. ?Doing exercises (physical therapy) to strengthen your neck muscles. ?Taking medicines. ?Having shots (injections) in your spine, in very bad cases. ?Having surgery. This may be needed if other treatments do not help. The type of surgery that is used will depend on the cause of your condition. ?Follow these instructions at home: ?If you have a soft neck collar: ?Wear it as told by your doctor. Take it off only as told by your doctor. ?Ask your doctor if you can take the collar off for cleaning and bathing. If you are allowed to take the collar off for cleaning or bathing: ?Follow instructions from your doctor about how to take off the collar  safely. ?Clean the collar by wiping it with mild soap and water and drying it completely. ?Take out any removable pads in the collar every 1-2 days. Wash them by hand with soap and water. Let them air-dry completely before you put them back in the collar. ?Check your skin under the collar for redness or sores. If you see any, tell your doctor. ?Managing pain ?  ?Take over-the-counter and prescription medicines only as told by your doctor. ?If told, put ice on the painful area. To do this: ?If you have a soft neck collar, take if off as told by your doctor. ?Put ice in a plastic bag. ?Place a towel between your skin and the bag. ?Leave the ice on for 20 minutes, 2-3 times a day. ?Take off the ice if your skin turns bright red. This is very important. If you cannot feel pain, heat, or cold, you have a greater risk of damage to the area. ?If using ice does not help, you can try using heat. Use the heat source that your doctor recommends, such as a moist heat pack or a heating pad. ?Place a towel between your skin and the heat source. ?Leave the heat on for 20-30 minutes. ?Take off the heat if your skin turns bright red. This is very important. If you cannot feel pain, heat, or cold, you have a greater risk of getting burned. ?You may try a gentle neck and shoulder rub (massage). ?Activity ?Rest as needed. ?Return to your normal   activities when your doctor says that it is safe. ?Do exercises as told by your doctor or physical therapist. ?You may have to avoid lifting. Ask your doctor how much you can safely lift. ?General instructions ?Use a flat pillow when you sleep. ?Do not drive while wearing a soft neck collar. If you do not have a soft neck collar, ask your doctor if it is safe to drive while your neck heals. ?Ask your doctor if you should avoid driving or using machines while you are taking your medicine. ?Do not smoke or use any products that contain nicotine or tobacco. If you need help quitting, ask your  doctor. ?Keep all follow-up visits. ?Contact a doctor if: ?Your condition does not get better with treatment. ?Get help right away if: ?Your pain gets worse and medicine does not help. ?You lose feeling or feel weak in your hand, arm, face, or leg. ?You have a high fever. ?Your neck is stiff. ?You cannot control when you poop or pee (have incontinence). ?You have trouble with walking, balance, or talking. ?Summary ?Cervical radiculopathy means that a nerve in the neck is pinched or bruised. ?A nerve can get pinched from a bulging disk, arthritis, an injury to the neck, or other causes. ?Symptoms include pain, tingling, or loss of feeling that goes from the neck to the arm or hand. ?Weakness in your arm or hand can happen in very bad cases. ?Treatment may include resting, wearing a soft neck collar, and doing exercises. You might need to take medicines for pain. In very bad cases, shots or surgery may be needed. ?This information is not intended to replace advice given to you by your health care provider. Make sure you discuss any questions you have with your health care provider. ?Document Revised: 12/02/2020 Document Reviewed: 12/02/2020 ?[employer]vier Patient Education ? 2022 [employer] ? ?
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
## 2021-07-13 NOTE — Progress Notes (Signed)
Having radiating pain from breast down back. Having SOB Not taking Cymbalta

## 2021-07-14 NOTE — Telephone Encounter (Signed)
Reached pt stated has  already clarified with PCP.

## 2021-07-15 ENCOUNTER — Encounter: Payer: 59 | Attending: Internal Medicine | Admitting: Registered"

## 2021-07-15 ENCOUNTER — Other Ambulatory Visit: Payer: Self-pay

## 2021-07-15 ENCOUNTER — Telehealth: Payer: Self-pay | Admitting: Family Medicine

## 2021-07-15 DIAGNOSIS — R1310 Dysphagia, unspecified: Secondary | ICD-10-CM | POA: Diagnosis present

## 2021-07-15 DIAGNOSIS — R634 Abnormal weight loss: Secondary | ICD-10-CM | POA: Insufficient documentation

## 2021-07-15 NOTE — Patient Instructions (Signed)
Continued Instructions/Goals:  Eat first meal within 1 hour of waking and eat after that time every 3-4 hours while awake. If low appetite, include at least a balanced snack with carb and protein every 3-4 hours and your Ensures per usual.   Avoid eating within 3 hours of laying down.   Have a calm eating environment as much as possible. Eat slowly and chew to applesauce like texture.   Include smooth foods that go down well. At meals, include a grain/starch, a protein and a fruit/vegetable that is smooth. If unable to eat a meal, drink an Ensure.  Avoid spicy foods, acidic foods like tomatoes or citrus, high fat foods like whole milk or fried foods, chocolate, caffeine, pepper, spearmint and peppermint (see full list) to help prevent acid reflux.   Recommend counseling to help with stress regarding health concerns as stress can make reflux and overall pain even worse. I will send message to Dr. Margarita Rana about referral   Doing great working on your nutrition!

## 2021-07-15 NOTE — Progress Notes (Signed)
Medical Nutrition Therapy:  Appt start time: 1025 end time:  1130.  Assessment:  Primary concerns today: Pt referred due to loss of weight, dysphagia.   Nutrition Follow-Up: Pt present for appointment with fiancee.   Pt reports has changed anxiety medications since last visit. Reports it is helping some. Reports having a lot of anxiety-heart racing. Mammogram was negative but still having pain in breast and it still causes some anxiety due to family hx of breast cancer. Has been referred to PT. Doing patches for pain-which she reports is now a little better. Pt reports she would like to see a counselor for help with anxiety.   Reports doing a lot better but still having some acid. Reports was scared to eat but doing better about eating more consistently now. Reports her wt fluctuates about 2 lb day to day. Reports having 3 meals has been going well, and keeping snacks in car. Drinks 2 Ensures,1 in the morning, one at night. Reports bananas were causing reflux because she noticed improvement after taking them out of diet.   Pt reports she didn't start any supplements because she had her B12 checked and it was elevated.   Food Allergies/Intolerances: None reported.   GI Concerns: acid reflux but improved. Reports more regular stools but also on period right now which may be affecting it.   Pertinent Lab Values:  06/21/21:  B12: 1316 (H)  Preferred Learning Style:  No preference indicated   Learning Readiness:  Ready  MEDICATIONS: Reviewed. See list.    DIETARY INTAKE:  Usual eating pattern includes 3 meals and 0 snacks per day.   Common foods: banana, Ensure.  Avoided foods: spicy foods, acidic and high fat foods.    Typical Snacks: None reported.     Typical Beverages: water, Body Armour, Vitamin Water.  Location of Meals: Breakfast in car on the way to work, other meals at home.   Electronics Present at [employer]: N/A  24-hr recall:  Mornings: Ensure, fruit, peanut butter  sandwich felt ok  Snack:  Lunch: stir fried in olive oil chicken wing, green beans, peaches fruit cup, water (felt fine) Snack:  Dinner 730-8 PM: grilled cheese sandwich, fruit cup, water (felt good)  Snack:  Beverages: Ensure x 2, water   Usual physical activity: None reported.    Progress Towards Goal(s):  Some progress.   Nutritional Diagnosis:  NI-2.1 Inadequate intake As related to dx GERD.  As evidenced by pt reports low appetite, concern about what to eat to prevent GERD.    Intervention:  Nutrition counseling provided. Dietitian praised pt for working to eat consistently and avoiding trigger foods for reflux. Dietitian will reach out to pt's PCP regarding counseling referral. Encouraged pt to continue with recommendations. Pt appeared agreeable with information/goals discussed.   Continued Instructions/Goals:  Eat first meal within 1 hour of waking and eat after that time every 3-4 hours while awake. If low appetite, include at least a balanced snack with carb and protein every 3-4 hours and your Ensures per usual.   Avoid eating within 3 hours of laying down.   Have a calm eating environment as much as possible. Eat slowly and chew to applesauce like texture.   Include smooth foods that go down well. At meals, include a grain/starch, a protein and a fruit/vegetable that is smooth. If unable to eat a meal, drink an Ensure.  Avoid spicy foods, acidic foods like tomatoes or citrus, high fat foods like whole milk or fried foods, chocolate,  caffeine, pepper, spearmint and peppermint (see full list) to help prevent acid reflux.   Recommend counseling to help with stress regarding health concerns as stress can make reflux and overall pain even worse. I will send message to Dr. Margarita Rana about referral   Doing great working on your nutrition!   Teaching Method Utilized:  Visual Auditory  Barriers to learning/adherence to lifestyle change: GERD, ongoing pain/discomfort.    Demonstrated degree of understanding via:  Teach Back   Monitoring/Evaluation:  Dietary intake, exercise, and body weight in 1 month(s).

## 2021-07-15 NOTE — Telephone Encounter (Signed)
Patient with anxiety requesting referral for counseling services.

## 2021-07-19 ENCOUNTER — Encounter (HOSPITAL_COMMUNITY): Payer: Self-pay | Admitting: Internal Medicine

## 2021-07-21 ENCOUNTER — Other Ambulatory Visit: Payer: Self-pay | Admitting: Family Medicine

## 2021-07-21 ENCOUNTER — Other Ambulatory Visit: Payer: Self-pay

## 2021-07-22 ENCOUNTER — Encounter: Payer: Self-pay | Admitting: Registered"

## 2021-07-22 ENCOUNTER — Other Ambulatory Visit: Payer: Self-pay

## 2021-07-22 ENCOUNTER — Other Ambulatory Visit: Payer: Self-pay | Admitting: Family Medicine

## 2021-07-22 ENCOUNTER — Telehealth: Payer: Self-pay

## 2021-07-22 ENCOUNTER — Other Ambulatory Visit: Payer: Self-pay | Admitting: Pharmacist

## 2021-07-22 MED ORDER — HYDROXYZINE HCL 25 MG PO TABS
25.0000 mg | ORAL_TABLET | Freq: Three times a day (TID) | ORAL | 1 refills | Status: DC
  Filled 2021-07-22: qty 60, 20d supply, fill #0
  Filled 2021-08-26: qty 60, 20d supply, fill #1

## 2021-07-22 MED ORDER — LIDOCAINE 5 % EX PTCH
1.0000 | MEDICATED_PATCH | CUTANEOUS | 1 refills | Status: DC
  Filled 2021-07-22: qty 30, 30d supply, fill #0

## 2021-07-22 NOTE — Telephone Encounter (Signed)
Can we re submit the last two referral placed by doctor newlin again, pt states that she has reached out to both clinics and they informed her that they have not received the referral.

## 2021-07-25 ENCOUNTER — Encounter: Payer: Self-pay | Admitting: Physical Medicine & Rehabilitation

## 2021-07-25 NOTE — Telephone Encounter (Signed)
Can I have the phone numbers for the clinic's that she was referred to.

## 2021-07-25 NOTE — Telephone Encounter (Signed)
Pt has been sent a my chart message regarding her referrals.

## 2021-07-26 ENCOUNTER — Other Ambulatory Visit: Payer: Self-pay

## 2021-07-26 ENCOUNTER — Telehealth: Payer: Self-pay | Admitting: Family Medicine

## 2021-07-26 ENCOUNTER — Ambulatory Visit (INDEPENDENT_AMBULATORY_CARE_PROVIDER_SITE_OTHER): Payer: 59 | Admitting: Neurology

## 2021-07-26 DIAGNOSIS — R200 Anesthesia of skin: Secondary | ICD-10-CM

## 2021-07-26 NOTE — Procedures (Signed)
[facility]  [address]  [place], [place] [address] Tel: [phone] Fax:  [phone] Test Date:  07/26/2021  Patient: Laurie Bell DOB: [birth year] Physician: Narda Amber, DO  Sex: Female Height: 5\' 7"  Ref Phys: Narda Amber, DO  ID#: [id number]   Technician:    Patient Complaints: This is a 41 year old female referred for evaluation of paresthesias of the hands and feet.  NCV & EMG Findings: Extensive electrodiagnostic testing of the left upper extremity and additional studies of the right shows: Left median, ulnar, mixed palmar, sural, and superficial peroneal sensory responses are within normal limits. Left median, ulnar, peroneal, and tibial motor responses are within normal limits. Left tibial H reflex study is within normal limits. There is no evidence of active or chronic motor axonal loss changes affecting any of the tested muscles.  Motor unit configuration and recruitment pattern is within normal limits.  Impression: This is a normal study of the left upper and lower extremities.  In particular, there is no evidence of a large fiber sensorimotor polyneuropathy, cervical/lumbosacral radiculopathy, or carpal tunnel syndrome.   ___________________________ Narda Amber, DO    Nerve Conduction Studies Anti Sensory Summary Table   Stim Site NR Peak (ms) Norm Peak (ms) P-T Amp (V) Norm P-T Amp  Left Median Anti Sensory (2nd Digit)  34C  Wrist    2.5 <3.4 65.3 >20  Left Sup Peroneal Anti Sensory (Ant Lat Mall)  34C  12 cm    2.5 <4.5 18.7 >5  Left Sural Anti Sensory (Lat Mall)  34C  Calf    3.0 <4.5 25.7 >5  Left Ulnar Anti Sensory (5th Digit)  34C  Wrist    2.5 <3.1 48.7 >12   Motor Summary Table   Stim Site NR Onset (ms) Norm Onset (ms) O-P Amp (mV) Norm O-P Amp Site1 Site2 Delta-0 (ms) Dist (cm) Vel (m/s) Norm Vel (m/s)  Left Median Motor (Abd Poll Brev)  34C  Wrist    2.4 <3.9 8.9 >6 Elbow Wrist 5.0 28.0 56 >50  Elbow     7.4  8.9         Left Peroneal Motor (Ext Dig Brev)  34C  Ankle    3.6 <5.5 4.0 >3 B Fib Ankle 6.9 34.0 49 >40  B Fib    10.5  4.0  Poplt B Fib 1.2 7.0 58 >40  Poplt    11.7  3.8         Left Tibial Motor (Abd Hall Brev)  34C  Ankle    2.8 <6.0 20.3 >8 Knee Ankle 7.7 41.0 53 >40  Knee    10.5  19.9         Left Ulnar Motor (Abd Dig Minimi)  34C  Wrist    2.3 <3.1 10.3 >7 B Elbow Wrist 3.6 23.0 64 >50  B Elbow    5.9  10.2  A Elbow B Elbow 1.6 10.0 63 >50  A Elbow    7.5  10.0          Comparison Summary Table   Stim Site NR Peak (ms) Norm Peak (ms) P-T Amp (V) Site1 Site2 Delta-P (ms) Norm Delta (ms)  Left Median/Ulnar Palm Comparison (Wrist - 8cm)  34C  Median Palm    1.3 <2.2 144.5 Median Palm Ulnar Palm 0.1   Ulnar Palm    1.4 <2.2 21.3       H Reflex Studies   NR H-Lat (ms) Lat Norm (ms) L-R  H-Lat (ms)  Left Tibial (Gastroc)  34C     32.24 <35    EMG   Side Muscle Ins Act Fibs Psw Fasc Number Recrt Dur Dur. Amp Amp. Poly Poly. Comment  Left AntTibialis Nml Nml Nml Nml Nml Nml Nml Nml Nml Nml Nml Nml N/A  Left Gastroc Nml Nml Nml Nml Nml Nml Nml Nml Nml Nml Nml Nml N/A  Left Flex Dig Long Nml Nml Nml Nml Nml Nml Nml Nml Nml Nml Nml Nml N/A  Left RectFemoris Nml Nml Nml Nml Nml Nml Nml Nml Nml Nml Nml Nml N/A  Left GluteusMed Nml Nml Nml Nml Nml Nml Nml Nml Nml Nml Nml Nml N/A  Left 1stDorInt Nml Nml Nml Nml Nml Nml Nml Nml Nml Nml Nml Nml N/A  Left PronatorTeres Nml Nml Nml Nml Nml Nml Nml Nml Nml Nml Nml Nml N/A  Left Biceps Nml Nml Nml Nml Nml Nml Nml Nml Nml Nml Nml Nml N/A  Left Deltoid Nml Nml Nml Nml Nml Nml Nml Nml Nml Nml Nml Nml N/A  Left Triceps Nml Nml Nml Nml Nml Nml Nml Nml Nml Nml Nml Nml N/A      Waveforms:

## 2021-07-26 NOTE — Telephone Encounter (Signed)
Patient called to clarify the refill request of Lidocaine patch. She says that when she picked it up from the pharmacy, the pharmacist told her to use the patch on the front and back where the pain is. She says she didn't read the instructions, threw the paper away that was in the bag and just followed what the pharmacist said. She says she is out of the patches and would like a refill.

## 2021-07-26 NOTE — Telephone Encounter (Signed)
Patient called in, kthough she could use 2 patches a day of lidocaine (LIDODERM) 5 %, so now she is out of this and asking for supply until her next refill Laurie Bell at [facility] Phone:  [phone]  Fax:  [phone]

## 2021-07-27 ENCOUNTER — Other Ambulatory Visit: Payer: Self-pay

## 2021-07-27 ENCOUNTER — Telehealth: Payer: Self-pay | Admitting: Neurology

## 2021-07-27 ENCOUNTER — Ambulatory Visit (HOSPITAL_COMMUNITY)
Admission: RE | Admit: 2021-07-27 | Discharge: 2021-07-27 | Disposition: A | Discharge status: Home / Self Care | Payer: 59 | Attending: Internal Medicine | Admitting: Internal Medicine

## 2021-07-27 ENCOUNTER — Encounter (HOSPITAL_COMMUNITY)
Admission: RE | Disposition: A | Discharge status: Home / Self Care | Payer: Self-pay | Source: Home / Self Care | Attending: Internal Medicine

## 2021-07-27 ENCOUNTER — Encounter (HOSPITAL_COMMUNITY): Payer: Self-pay | Admitting: Internal Medicine

## 2021-07-27 DIAGNOSIS — R12 Heartburn: Secondary | ICD-10-CM | POA: Insufficient documentation

## 2021-07-27 HISTORY — PX: ESOPHAGEAL MANOMETRY: SHX5429

## 2021-07-27 SURGERY — MANOMETRY, ESOPHAGUS
Anesthesia: Choice

## 2021-07-27 MED ORDER — LIDOCAINE VISCOUS HCL 2 % MT SOLN
OROMUCOSAL | Status: AC
  Filled 2021-07-27: qty 15

## 2021-07-27 MED ORDER — LIDOCAINE 5 % EX PTCH
MEDICATED_PATCH | CUTANEOUS | 1 refills | Status: DC
  Filled 2021-07-27: qty 60, fill #0
  Filled 2021-07-29: qty 30, 15d supply, fill #0
  Filled 2021-08-16: qty 30, 15d supply, fill #1
  Filled 2021-08-17 – 2021-08-26 (×2): qty 30, 15d supply, fill #0

## 2021-07-27 SURGICAL SUPPLY — 2 items
FACESHIELD LNG OPTICON STERILE (SAFETY) IMPLANT
GLOVE BIO SURGEON STRL SZ8 (GLOVE) ×6 IMPLANT

## 2021-07-27 NOTE — Telephone Encounter (Signed)
Done

## 2021-07-27 NOTE — Telephone Encounter (Signed)
Pt is wanting to get script written for 2 patches instated of 1

## 2021-07-27 NOTE — Progress Notes (Signed)
Esophageal Manometry done per protocol. Patient tolerated well without distress or complication.  

## 2021-07-27 NOTE — Telephone Encounter (Signed)
Patient informed that nerve testing is normal.  She reports ongoing left arm pain and is getting neck PT, ordered by PCP.  She may need MRI cervical spine going forward, if no improvement.

## 2021-07-28 ENCOUNTER — Encounter (HOSPITAL_COMMUNITY): Payer: Self-pay | Admitting: Internal Medicine

## 2021-07-28 NOTE — Telephone Encounter (Signed)
Pt was called and informed of script being changed and being sent to the pharmacy.

## 2021-07-29 ENCOUNTER — Other Ambulatory Visit: Payer: Self-pay

## 2021-07-29 NOTE — Telephone Encounter (Signed)
I spoke with pt and scheduled an appointment for 08/17/21 at 4:00.

## 2021-08-01 ENCOUNTER — Telehealth: Payer: Self-pay | Admitting: Internal Medicine

## 2021-08-01 NOTE — Telephone Encounter (Signed)
Inbound call from patient stating that she is still having acid reflux when taking Protonix 2 times daily. Patient is seeking advice if there is an alternative. Please advise.

## 2021-08-02 ENCOUNTER — Encounter: Payer: 59 | Admitting: Neurology

## 2021-08-02 NOTE — Telephone Encounter (Signed)
Called pt and informed her about Dr. Libby Maw response. Verbalized acceptance and understanding.

## 2021-08-03 ENCOUNTER — Other Ambulatory Visit: Payer: Self-pay

## 2021-08-03 ENCOUNTER — Ambulatory Visit: Payer: 59 | Attending: Family Medicine

## 2021-08-03 DIAGNOSIS — M5412 Radiculopathy, cervical region: Secondary | ICD-10-CM | POA: Diagnosis not present

## 2021-08-03 DIAGNOSIS — G540 Brachial plexus disorders: Secondary | ICD-10-CM | POA: Diagnosis present

## 2021-08-03 DIAGNOSIS — M542 Cervicalgia: Secondary | ICD-10-CM | POA: Insufficient documentation

## 2021-08-03 NOTE — Therapy (Signed)
OUTPATIENT PHYSICAL THERAPY CERVICAL EVALUATION   Patient Name: Laurie Bell MRN: [id number] DOB:May 02, 1981, 41 y.o., female Today's Date: 08/03/2021   PT End of Session - 08/03/21 1653     Visit Number 1    Number of Visits 6    Date for PT Re-Evaluation 09/14/21    Authorization Type [employer]    PT Start Time 1615    PT Stop Time 1700    PT Time Calculation (min) 45 min    Activity Tolerance Patient tolerated treatment well    Behavior During Therapy [facility] for tasks assessed/performed             Past Medical History:  Diagnosis Date   Anxiety    Arthritis    Chlamydia contact, treated    GERD (gastroesophageal reflux disease)    Past Surgical History:  Procedure Laterality Date   ENDOMETRIAL ABLATION N/A 04/25/2017   Procedure: ENDOMETRIAL ABLATION With NOVASURE;  Surgeon: Woodroe Mode, MD;  Location: [place];  Service: Gynecology;  Laterality: N/A;   ESOPHAGEAL MANOMETRY N/A 07/27/2021   Procedure: ESOPHAGEAL MANOMETRY (EM);  Surgeon: Sharyn Creamer, MD;  Location: [facility];  Service: Gastroenterology;  Laterality: N/A;   TUBAL LIGATION     Patient Active Problem List   Diagnosis Date Noted   Irregular heart rhythm 04/15/2021   Numbness on left side 04/15/2021   Dysphagia 03/17/2021   Hypomagnesemia 02/23/2021   Gastroesophageal reflux disease    Hypokalemia 08/24/2020   Atypical chest pain 08/24/2020   Nausea and vomiting 08/24/2020   Hypocalcemia 08/24/2020   DUB (dysfunctional uterine bleeding) 01/12/2017   Fibroid, uterine 01/12/2017    PCP: Charlott Rakes, MD  REFERRING PROVIDER: Charlott Rakes, MD  REFERRING DIAG: Cervicalgia   THERAPY DIAG:  Cervicalgia  Thoracic outlet syndrome  ONSET DATE: November 2022  SUBJECTIVE:                                                                                                                                                                                                          SUBJECTIVE STATEMENT: Describes L anterior   PERTINENT HISTORY:  Unremarkable   PAIN:  Are you having pain? Yes NPRS scale: 10/10 Pain location: L chest Pain orientation: Left  PAIN TYPE: aching and throbbing Pain description: constant, burning, and aching  Aggravating factors: activity Relieving factors: none  PRECAUTIONS: None  WEIGHT BEARING RESTRICTIONS No  FALLS:  Has patient fallen in last 6 months? 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[employer]  PLOF: Independent  PATIENT GOALS To manage and lessen my pain  OBJECTIVE:   DIAGNOSTIC FINDINGS:  Unremarkable brain MRI  PATIENT SURVEYS:  FOTO 23   COGNITION: Overall cognitive status: Within functional limits for tasks assessed   SENSATION: Light touch: Appears intact  POSTURE:  Elevated L first rib  PALPATION: Tenderness to first rib and scalene group L   CERVICAL AROM/PROM  A/PROM A/PROM (deg) 08/03/2021  Flexion 75%  Extension 75%  Right lateral flexion WFL  Left lateral flexion WFL  Right rotation [facility]  Left rotation 75%   (Blank rows = not tested)  UE AROM/PROM:  WNL throughout  A/PROM Right 08/03/2021 Left 08/03/2021  Shoulder flexion    Shoulder extension    Shoulder abduction    Shoulder adduction    Shoulder extension    Shoulder internal rotation    Shoulder external rotation    Elbow flexion    Elbow extension    Wrist flexion    Wrist extension    Wrist ulnar deviation    Wrist radial deviation    Wrist pronation    Wrist supination       UE MMT:  MMT Right 08/03/2021 Left 08/03/2021  Shoulder flexion 5 5  Shoulder extension    Shoulder abduction 5 5  Shoulder adduction    Shoulder extension    Shoulder internal rotation 5 5  Shoulder external rotation 5 5  Middle trapezius    Lower trapezius    Elbow flexion 5 5  Elbow extension 5 5  Wrist flexion    Wrist extension    Wrist  ulnar deviation    Wrist radial deviation    Wrist pronation    Wrist supination    Grip strength 31 25   (Blank rows = not tested)  CERVICAL SPECIAL TESTS:  Upper limb tension test (ULTT): Positive   FUNCTIONAL TESTS:  Not applicable     TODAY'S TREATMENT:  Evaluation   PATIENT EDUCATION:  Education details: Discussed eval findings, rehab rationale and POC and patient is in agreement  Person educated: Patient Education method: Consulting civil engineer, Demonstration, and Handouts Education comprehension: verbalized understanding, returned demonstration, tactile cues required, and needs further education   HOME EXERCISE PROGRAM: Self first rib w/tennis ball into L shoulder flexion in sit/supine  ASSESSMENT:  CLINICAL IMPRESSION: Patient is a 41 y.o. female who was seen today for physical therapy evaluation and treatment for L arm and cervical pain related to TOS and elevated first rib/tight scalene group.  L shoulder ROM and strength WNL, cervical mobility limited in R SB and L rotation.      OBJECTIVE IMPAIRMENTS decreased knowledge of condition, decreased mobility, decreased ROM, decreased strength, impaired UE functional use, postural dysfunction, and pain.   ACTIVITY LIMITATIONS cleaning, occupation, and LUE use .   PERSONAL FACTORS Behavior pattern, Profession, and Time since onset of injury/illness/exacerbation are also affecting patient's functional outcome.    REHAB POTENTIAL: Good  CLINICAL DECISION MAKING: Stable/uncomplicated  EVALUATION COMPLEXITY: Moderate   GOALS: Goals reviewed with patient? Yes  SHORT TERM GOALS:  STG Name Target Date Goal status  1 Patient to demonstrate independence in HEP  Baseline: Self first rib w/tennis ball into L shoulder flexion in sit/supine 08/25/2021 INITIAL  2 Expand HEP Baseline: TBD 08/25/2021 INITIAL   LONG TERM GOALS:   LTG Name Target Date Goal status  1 Increase FOTO score to 51 Baseline: 23 09/14/2021 INITIAL  2  Decrease pain to 2/10  Baseline: 10/10 09/14/2021 INITIAL  3 Increase L grip strength to 30# Baseline: 25# 09/15/2021 INITIAL  4 Increase L cervical rotation to 90% Baseline: 75% 09/15/2021 INITIAL  PLAN: PT FREQUENCY: 1x/week  PT DURATION: 6 weeks  PLANNED INTERVENTIONS: Therapeutic exercises, Therapeutic activity, Neuro Muscular re-education, Balance training, Gait training, Patient/Family education, Joint mobilization, Dry Needling, and Manual therapy  PLAN FOR NEXT SESSION: Review HEP, expand HEP, STM for scalene stretch, first rib techniques   Lanice Shirts, PT 08/03/2021, 5:06 PM

## 2021-08-10 ENCOUNTER — Other Ambulatory Visit: Payer: Self-pay

## 2021-08-10 ENCOUNTER — Ambulatory Visit: Payer: 59 | Attending: Family Medicine

## 2021-08-10 DIAGNOSIS — G540 Brachial plexus disorders: Secondary | ICD-10-CM | POA: Insufficient documentation

## 2021-08-10 DIAGNOSIS — M542 Cervicalgia: Secondary | ICD-10-CM | POA: Diagnosis present

## 2021-08-10 NOTE — Therapy (Signed)
OUTPATIENT PHYSICAL THERAPY TREATMENT NOTE   Patient Name: Laurie Bell MRN: [id number] DOB:02-27-1981, 41 y.o., female Today's Date: 08/10/2021  PCP: Charlott Rakes, MD REFERRING PROVIDER: Charlott Rakes, MD   PT End of Session - 08/10/21 1748     Visit Number 2    Number of Visits 6    Date for PT Re-Evaluation 09/14/21    Authorization Type [employer]    Progress Note Due on Visit 6    PT Start Time 3846    PT Stop Time 1825    PT Time Calculation (min) 40 min    Activity Tolerance Patient tolerated treatment well    Behavior During Therapy [facility] for tasks assessed/performed             Past Medical History:  Diagnosis Date   Anxiety    Arthritis    Chlamydia contact, treated    GERD (gastroesophageal reflux disease)    Past Surgical History:  Procedure Laterality Date   ENDOMETRIAL ABLATION N/A 04/25/2017   Procedure: ENDOMETRIAL ABLATION With NOVASURE;  Surgeon: Woodroe Mode, MD;  Location: [place];  Service: Gynecology;  Laterality: N/A;   ESOPHAGEAL MANOMETRY N/A 07/27/2021   Procedure: ESOPHAGEAL MANOMETRY (EM);  Surgeon: Sharyn Creamer, MD;  Location: [facility];  Service: Gastroenterology;  Laterality: N/A;   TUBAL LIGATION     Patient Active Problem List   Diagnosis Date Noted   Irregular heart rhythm 04/15/2021   Numbness on left side 04/15/2021   Dysphagia 03/17/2021   Hypomagnesemia 02/23/2021   Gastroesophageal reflux disease    Hypokalemia 08/24/2020   Atypical chest pain 08/24/2020   Nausea and vomiting 08/24/2020   Hypocalcemia 08/24/2020   DUB (dysfunctional uterine bleeding) 01/12/2017   Fibroid, uterine 01/12/2017    REFERRING DIAG: Cervicalgia  Thoracic outlet syndrome  THERAPY DIAG:  Cervicalgia  Thoracic outlet syndrome  PERTINENT HISTORY: unremarkable  PRECAUTIONS: none  SUBJECTIVE: Felt a bit better after last session but symptoms resumed the next day  PAIN:  Are you having pain?  Yes NPRS scale: 6/10 Pain location: L arm Pain orientation: Left  PAIN TYPE: aching Pain description: burning  Aggravating factors: activity Relieving factors: rest      OBJECTIVE:    DIAGNOSTIC FINDINGS:  Unremarkable brain MRI   PATIENT SURVEYS:  FOTO 23     COGNITION: Overall cognitive status: Within functional limits for tasks assessed            SENSATION: Light touch: Appears intact   POSTURE:  Elevated L first rib   PALPATION: Tenderness to first rib and scalene group L         CERVICAL AROM/PROM   A/PROM A/PROM (deg) 08/03/2021  Flexion 75%  Extension 75%  Right lateral flexion WFL  Left lateral flexion WFL  Right rotation [facility]  Left rotation 75%   (Blank rows = not tested)   UE AROM/PROM:  WNL throughout   A/PROM Right 08/03/2021 Left 08/03/2021  Shoulder flexion      Shoulder extension      Shoulder abduction      Shoulder adduction      Shoulder extension      Shoulder internal rotation      Shoulder external rotation      Elbow flexion      Elbow extension      Wrist flexion      Wrist extension      Wrist ulnar deviation      Wrist radial  deviation      Wrist pronation      Wrist supination          UE MMT:   MMT Right 08/03/2021 Left 08/03/2021  Shoulder flexion 5 5  Shoulder extension      Shoulder abduction 5 5  Shoulder adduction      Shoulder extension      Shoulder internal rotation 5 5  Shoulder external rotation 5 5  Middle trapezius      Lower trapezius      Elbow flexion 5 5  Elbow extension 5 5  Wrist flexion      Wrist extension      Wrist ulnar deviation      Wrist radial deviation      Wrist pronation      Wrist supination      Grip strength 31 25   (Blank rows = not tested)   CERVICAL SPECIAL TESTS:  Upper limb tension test (ULTT): Positive     FUNCTIONAL TESTS:  Not applicable        TODAY'S TREATMENT:  [facility] Adult PT Treatment:                                                DATE:  08/10/21 Therapeutic Exercise: UBE L1 3/3 min Manual Therapy: Scalene stretches 30s x3 all 3 heads. STM to L first rib, pec minor release, MET to L first rib into shoulder flexion in supine 10x. SO release 5 min.      PATIENT EDUCATION:  Education details: Discussed eval findings, rehab rationale and POC and patient is in agreement  Person educated: Patient Education method: Explanation, Demonstration, and Handouts Education comprehension: verbalized understanding, returned demonstration, tactile cues required, and needs further education     HOME EXERCISE PROGRAM: Access Code: [email or web address] URL: [email or web address] Date: 08/10/2021 Prepared by: Sharlynn Oliphant  Exercises Seated Shoulder Shrugs - 2 x daily - 7 x weekly - 2 sets - 10 reps Seated Scapular Retraction - 2 x daily - 7 x weekly - 2 sets - 10 reps Seated Upper Trapezius Stretch - 2 x daily - 7 x weekly - 2 sets - 10 reps    ASSESSMENT:   CLINICAL IMPRESSION: Continued L shoulder and LUE symptoms due to soft tissue tightness and irritability.  Limited response to manual interventions due to soft tissue sensitivity.  Continue to progress self stretching to increase mobility throughout L neck/shoulder region.      OBJECTIVE IMPAIRMENTS decreased knowledge of condition, decreased mobility, decreased ROM, decreased strength, impaired UE functional use, postural dysfunction, and pain.    ACTIVITY LIMITATIONS cleaning, occupation, and LUE use .    PERSONAL FACTORS Behavior pattern, Profession, and Time since onset of injury/illness/exacerbation are also affecting patient's functional outcome.      REHAB POTENTIAL: Good   CLINICAL DECISION MAKING: Stable/uncomplicated   EVALUATION COMPLEXITY: Moderate     GOALS: Goals reviewed with patient? Yes   SHORT TERM GOALS:   STG Name Target Date Goal status  1 Patient to demonstrate independence in HEP  Baseline: Self first rib w/tennis ball into L shoulder  flexion in sit/supine 08/25/2021 INITIAL  2 Expand HEP Baseline: TBD 08/25/2021 INITIAL    LONG TERM GOALS:    LTG Name Target Date Goal status  1 Increase FOTO score to 51 Baseline: 23 09/14/2021 INITIAL  2 Decrease  pain to 2/10 Baseline: 10/10 09/14/2021 INITIAL  3 Increase L grip strength to 30# Baseline: 25# 09/15/2021 INITIAL  4 Increase L cervical rotation to 90% Baseline: 75% 09/15/2021 INITIAL  PLAN: PT FREQUENCY: 1x/week   PT DURATION: 6 weeks   PLANNED INTERVENTIONS: Therapeutic exercises, Therapeutic activity, Neuro Muscular re-education, Balance training, Gait training, Patient/Family education, Joint mobilization, Dry Needling, and Manual therapy   PLAN FOR NEXT SESSION: Review HEP, expand HEP, STM for scalene stretch, first rib techniques, MET? Self stretching     Lanice Shirts, PT 08/10/2021, 5:52 PM

## 2021-08-12 ENCOUNTER — Ambulatory Visit: Payer: 59 | Admitting: Registered"

## 2021-08-16 ENCOUNTER — Other Ambulatory Visit: Payer: Self-pay

## 2021-08-17 ENCOUNTER — Ambulatory Visit: Payer: 59 | Attending: Family Medicine | Admitting: Clinical

## 2021-08-17 ENCOUNTER — Telehealth: Payer: Self-pay | Admitting: Family Medicine

## 2021-08-17 ENCOUNTER — Other Ambulatory Visit: Payer: Self-pay

## 2021-08-17 ENCOUNTER — Other Ambulatory Visit (HOSPITAL_COMMUNITY): Payer: Self-pay

## 2021-08-17 DIAGNOSIS — F331 Major depressive disorder, recurrent, moderate: Secondary | ICD-10-CM

## 2021-08-17 DIAGNOSIS — F410 Panic disorder [episodic paroxysmal anxiety] without agoraphobia: Secondary | ICD-10-CM | POA: Diagnosis not present

## 2021-08-17 DIAGNOSIS — F411 Generalized anxiety disorder: Secondary | ICD-10-CM

## 2021-08-17 NOTE — Telephone Encounter (Signed)
Pt states the [employer] only had 30 of the 60 lidocaine patches for her prescriptions.  Now they are telling her they do not know when they will get the rest  in. The pharmacy advised her to call [place] outpt pharmacy.  However there prices are much higher, and they are sending over a prior auth for the patches.

## 2021-08-18 ENCOUNTER — Telehealth (INDEPENDENT_AMBULATORY_CARE_PROVIDER_SITE_OTHER): Payer: Self-pay | Admitting: Clinical

## 2021-08-18 ENCOUNTER — Ambulatory Visit: Payer: 59

## 2021-08-18 ENCOUNTER — Encounter: Payer: Self-pay | Admitting: Internal Medicine

## 2021-08-18 ENCOUNTER — Ambulatory Visit (INDEPENDENT_AMBULATORY_CARE_PROVIDER_SITE_OTHER): Payer: 59 | Admitting: Internal Medicine

## 2021-08-18 ENCOUNTER — Other Ambulatory Visit: Payer: Self-pay

## 2021-08-18 VITALS — BP 113/64 | HR 64 | Ht 67.0 in | Wt 178.0 lb

## 2021-08-18 DIAGNOSIS — K225 Diverticulum of esophagus, acquired: Secondary | ICD-10-CM

## 2021-08-18 DIAGNOSIS — F419 Anxiety disorder, unspecified: Secondary | ICD-10-CM

## 2021-08-18 DIAGNOSIS — R1310 Dysphagia, unspecified: Secondary | ICD-10-CM

## 2021-08-18 DIAGNOSIS — K219 Gastro-esophageal reflux disease without esophagitis: Secondary | ICD-10-CM | POA: Diagnosis not present

## 2021-08-18 DIAGNOSIS — M542 Cervicalgia: Secondary | ICD-10-CM | POA: Diagnosis not present

## 2021-08-18 DIAGNOSIS — R109 Unspecified abdominal pain: Secondary | ICD-10-CM

## 2021-08-18 DIAGNOSIS — G540 Brachial plexus disorders: Secondary | ICD-10-CM

## 2021-08-18 MED ORDER — FLUOXETINE HCL 40 MG PO CAPS
40.0000 mg | ORAL_CAPSULE | Freq: Every day | ORAL | 3 refills | Status: DC
  Filled 2021-08-18: qty 30, 30d supply, fill #0
  Filled 2021-09-02: qty 30, 30d supply, fill #1

## 2021-08-18 MED ORDER — BACLOFEN 10 MG PO TABS
ORAL_TABLET | ORAL | 0 refills | Status: DC
  Filled 2021-08-18: qty 30, 30d supply, fill #0

## 2021-08-18 MED ORDER — ONDANSETRON 4 MG PO TBDP
4.0000 mg | ORAL_TABLET | Freq: Three times a day (TID) | ORAL | 0 refills | Status: DC | PRN
  Filled 2021-08-18: qty 20, 7d supply, fill #0

## 2021-08-18 NOTE — Therapy (Addendum)
OUTPATIENT PHYSICAL THERAPY TREATMENT NOTE   Patient Name: Laurie Bell MRN: [id number] DOB:[birth year], 41 y.o., female Today's Date: 08/18/2021  PCP: Charlott Rakes, MD REFERRING PROVIDER: Charlott Rakes, MD   PT End of Session - 08/18/21 1606     Visit Number 3    Number of Visits 6    Date for PT Re-Evaluation 09/14/21    Authorization Type [employer]    Progress Note Due on Visit 6    PT Start Time 1615    PT Stop Time 1700    PT Time Calculation (min) 45 min    Activity Tolerance Patient tolerated treatment well    Behavior During Therapy [facility] for tasks assessed/performed             Past Medical History:  Diagnosis Date   Anxiety    Arthritis    Chlamydia contact, treated    GERD (gastroesophageal reflux disease)    Past Surgical History:  Procedure Laterality Date   ENDOMETRIAL ABLATION N/A 04/25/2017   Procedure: ENDOMETRIAL ABLATION With NOVASURE;  Surgeon: Woodroe Mode, MD;  Location: [place];  Service: Gynecology;  Laterality: N/A;   ESOPHAGEAL MANOMETRY N/A 07/27/2021   Procedure: ESOPHAGEAL MANOMETRY (EM);  Surgeon: Sharyn Creamer, MD;  Location: [facility];  Service: Gastroenterology;  Laterality: N/A;   TUBAL LIGATION     Patient Active Problem List   Diagnosis Date Noted   Irregular heart rhythm 04/15/2021   Numbness on left side 04/15/2021   Dysphagia 03/17/2021   Hypomagnesemia 02/23/2021   Gastroesophageal reflux disease    Hypokalemia 08/24/2020   Atypical chest pain 08/24/2020   Nausea and vomiting 08/24/2020   Hypocalcemia 08/24/2020   DUB (dysfunctional uterine bleeding) 01/12/2017   Fibroid, uterine 01/12/2017    REFERRING DIAG: Cervicalgia  Thoracic outlet syndrome  THERAPY DIAG:  Cervicalgia  Thoracic outlet syndrome  PERTINENT HISTORY: unremarkable  PRECAUTIONS: none  SUBJECTIVE: Symptoms elevated today as she had a stressful week and ran out of lidocaine patches for anterior  shoulder.  PAIN:  Are you having pain? Yes NPRS scale: 6/10 Pain location: L arm Pain orientation: Left  PAIN TYPE: aching Pain description: burning  Aggravating factors: activity Relieving factors: rest      OBJECTIVE:    DIAGNOSTIC FINDINGS:  Unremarkable brain MRI   PATIENT SURVEYS:  FOTO 23     COGNITION: Overall cognitive status: Within functional limits for tasks assessed            SENSATION: Light touch: Appears intact   POSTURE:  Elevated L first rib   PALPATION: Tenderness to first rib and scalene group L         CERVICAL AROM/PROM   A/PROM A/PROM (deg) 08/03/2021  Flexion 75%  Extension 75%  Right lateral flexion WFL  Left lateral flexion WFL  Right rotation [facility]  Left rotation 75%   (Blank rows = not tested)   UE AROM/PROM:  WNL throughout   A/PROM Right 08/03/2021 Left 08/03/2021  Shoulder flexion      Shoulder extension      Shoulder abduction      Shoulder adduction      Shoulder extension      Shoulder internal rotation      Shoulder external rotation      Elbow flexion      Elbow extension      Wrist flexion      Wrist extension      Wrist ulnar deviation  Wrist radial deviation      Wrist pronation      Wrist supination          UE MMT:   MMT Right 08/03/2021 Left 08/03/2021  Shoulder flexion 5 5  Shoulder extension      Shoulder abduction 5 5  Shoulder adduction      Shoulder extension      Shoulder internal rotation 5 5  Shoulder external rotation 5 5  Middle trapezius      Lower trapezius      Elbow flexion 5 5  Elbow extension 5 5  Wrist flexion      Wrist extension      Wrist ulnar deviation      Wrist radial deviation      Wrist pronation      Wrist supination      Grip strength 31 25   (Blank rows = not tested)   CERVICAL SPECIAL TESTS:  Upper limb tension test (ULTT): Positive     FUNCTIONAL TESTS:  Not applicable        TODAY'S TREATMENT:   [facility] Adult PT Treatment:                                                 DATE: 08/18/21 Therapeutic Exercise: UBE 3/3 L1 Shrugs 15x Scapular retractions 5x Scapular depression from table 15x Latissimus press with breathing patterns emphasized 10x L UT stretch 30sx3 Grip strength 30# R, 40# L TPDN to scalene and UT on L performed by Carlus Pavlov PT Trigger Point Dry Needling Treatment: Pre-treatment instruction: Patient instructed on dry needling rationale, procedures, and possible side effects including pain during treatment (achy,cramping feeling), bruising, drop of blood, lightheadedness, nausea, sweating. Patient Consent Given: Yes Education handout provided: No Muscles treated: L scalenes and upper trap  Electrical stimulation performed: No Parameters: N/A Treatment response/outcome: Twitch response elicited Post-treatment instructions: Patient instructed to expect possible mild to moderate muscle soreness later today and/or tomorrow. Patient instructed in methods to reduce muscle soreness and to continue prescribed HEP. If patient was dry needled over the lung field, patient was instructed on signs and symptoms of pneumothorax and, however unlikely, to see immediate medical attention should they occur. Patient was also educated on signs and symptoms of infection and to seek medical attention should they occur. Patient verbalized understanding of these instructions and education.    [facility] Adult PT Treatment:                                                DATE: 08/10/21 Therapeutic Exercise: UBE L1 3/3 min Manual Therapy: Scalene stretches 30s x3 all 3 heads. STM to L first rib, pec minor release, MET to L first rib into shoulder flexion in supine 10x. SO release 5 min.      PATIENT EDUCATION:  Education details: Discussed eval findings, rehab rationale and POC and patient is in agreement  Person educated: Patient Education method: Explanation, Demonstration, and Handouts Education comprehension: verbalized understanding, returned  demonstration, tactile cues required, and needs further education     HOME EXERCISE PROGRAM: Access Code: [email or web address] URL: [email or web address] Date: 08/10/2021 Prepared by: Sharlynn Oliphant  Exercises Seated Shoulder Shrugs - 2 x daily - 7 x  weekly - 2 sets - 10 reps Seated Scapular Retraction - 2 x daily - 7 x weekly - 2 sets - 10 reps Seated Upper Trapezius Stretch - 2 x daily - 7 x weekly - 2 sets - 10 reps    ASSESSMENT:   CLINICAL IMPRESSION: Good response to TPDN, followed up by postural retraining and stretching of affected muscle groups.   OBJECTIVE IMPAIRMENTS decreased knowledge of condition, decreased mobility, decreased ROM, decreased strength, impaired UE functional use, postural dysfunction, and pain.    ACTIVITY LIMITATIONS cleaning, occupation, and LUE use .    PERSONAL FACTORS Behavior pattern, Profession, and Time since onset of injury/illness/exacerbation are also affecting patient's functional outcome.      REHAB POTENTIAL: Good   CLINICAL DECISION MAKING: Stable/uncomplicated   EVALUATION COMPLEXITY: Moderate     GOALS: Goals reviewed with patient? Yes   SHORT TERM GOALS:   STG Name Target Date Goal status  1 Patient to demonstrate independence in HEP  Baseline: Self first rib w/tennis ball into L shoulder flexion in sit/supine 08/25/2021 INITIAL  2 Expand HEP Baseline: TBD 08/25/2021 INITIAL    LONG TERM GOALS:    LTG Name Target Date Goal status  1 Increase FOTO score to 51 Baseline: 23 09/14/2021 INITIAL  2 Decrease pain to 2/10 Baseline: 10/10 09/14/2021 INITIAL  3 Increase L grip strength to 30# Baseline: 25# 09/15/2021 INITIAL  4 Increase L cervical rotation to 90% Baseline: 75% 09/15/2021 INITIAL  PLAN: PT FREQUENCY: 1x/week   PT DURATION: 6 weeks   PLANNED INTERVENTIONS: Therapeutic exercises, Therapeutic activity, Neuro Muscular re-education, Balance training, Gait training, Patient/Family education, Joint mobilization, Dry  Needling, and Manual therapy   PLAN FOR NEXT SESSION: stretching and strengthening of postural muscle groups, f/u on TPDN     Lanice Shirts, PT 08/18/2021, 4:09 PM

## 2021-08-18 NOTE — Telephone Encounter (Signed)
I have increased her dose of Prozac and sent a referral to [facility] behavioral health.  Thanks

## 2021-08-18 NOTE — BH Specialist Note (Signed)
Integrated Behavioral Health Initial In-Person Visit  MRN: [id number] Name: Laurie Bell  Number of [facility] Clinician visits: 1- Initial Visit  Session Start time: 1829    Session End time: 1700  Total time in minutes: 53   Types of Service: Individual psychotherapy  Interpretor:No. Interpretor Name and Language: N/A   Warm Hand Off Completed.        Subjective: Laurie Bell is a 41 y.o. female accompanied by  self Patient was referred by Charlott Rakes, MD for anxiety. Patient reports the following symptoms/concerns: Reports feeling anxious, excessive worrying, trouble relaxing, irritability, feeling depressed, decreased motivation, decreased energy, trouble sleeping, decreased appetite, and fidgeting. Reports that she started experiencing physical health problems in Nov 2022 but started experiencing panic attacks in 2021 when she had COVID. Reports that she frequently worries about her physical health and frequently assumes the worst, even after receiving the appropriate tests for her physical health. Reports that she experiences pain in her chest and shoulder which contributes to trouble sleeping. Reports experiencing racing thoughts. Reports that she also worries about her children who are adults. Reports her anxiety is impacting her ability work. Duration of problem: 5 months; Severity of problem: severe  Objective: Mood: Anxious and Depressed and Affect: Appropriate Risk of harm to self or others: No plan to harm self or others  Life Context: Family and Social: Pt receives support from fiance. Pt has two children who are adults. Pt's son lives with her. School/Work: Pt has two jobs.  Self-Care: Denies substance use. Pt currently prescribed hydroxyzine and fluoxetine.  Life Changes: Pt has experienced physical health changes over the past 5 months which has increased pt's anxiety.  Patient and/or Family's Strengths/Protective  Factors: Concrete supports in place (healthy food, safe environments, etc.)  Goals Addressed: Patient will: Reduce symptoms of: anxiety and depression Increase knowledge and/or ability of: coping skills  Demonstrate ability to: Increase healthy adjustment to current life circumstances  Progress towards Goals: Ongoing  Interventions: Interventions utilized: Mindfulness or Psychologist, educational, CBT Cognitive Behavioral Therapy, Supportive Counseling, and Psychoeducation and/or Health Education  Standardized Assessments completed: GAD-7 and PHQ 9 GAD 7 : Generalized Anxiety Score 08/17/2021 07/13/2021 06/21/2021 02/22/2021  Nervous, Anxious, on Edge '3 3 2 3  '$ Control/stop worrying '3 3 3 '$ 0  Worry too much - different things '3 3 3 '$ 0  Trouble relaxing '3 3 3 2  '$ Restless 0 0 0 0  Easily annoyed or irritable '3 2 1 2  '$ Afraid - awful might happen '2 3 3 '$ 0  Total GAD 7 Score '17 17 15 7     '$ Depression screen [facility] 2/9 08/17/2021 07/13/2021 06/21/2021 06/16/2021 05/31/2021  Decreased Interest '2 1 2 '$ 0 1  Down, Depressed, Hopeless 3 0 0 0 0  PHQ - 2 Score '5 1 2 '$ 0 1  Altered sleeping '3 1 2 '$ - 1  Tired, decreased energy '3 2 2 '$ - 1  Change in appetite '3 2 2 '$ - 2  Feeling bad or failure about yourself  0 0 0 - 0  Trouble concentrating 3 0 1 - 1  Moving slowly or fidgety/restless 3 0 1 - 0  Suicidal thoughts 0 0 0 - 0  PHQ-9 Score '20 6 10 '$ - 6  Difficult doing work/chores - - - - Not difficult at all    Patient and/or Family Response: Pt receptive to tx. Pt receptive to psychoeducation provided on anxiety, panic attacks, depression, and its association with physical health. Pt receptive to  cognitive restructuring to decrease negative thoughts. Pt receptive to assistance with cognitive processing skills. Pt receptive to grounding techniques to assist with panic attacks, deep breathing exercises, and journaling.   Patient Centered Plan: Patient is on the following Treatment Plan(s):  Anxiety and  depression  Assessment: Denies SI/HI. Denies auditory/visual hallucinations. Patient currently experiencing depression and severe anxiety. Pt appears to worry excessively about her health and frequently assumes the worst. Pt has difficulty with cognitive processing skills. Pt experiences panic attacks daily however pt adheres to medication..   Patient may benefit from psychiatry in order to adjust medications. LCSW provided psychoeducation on depression, anxiety, panic attacks, and its association with physical health. LCSW utilized cognitive restructuring to decrease negative thoughts. LCSW provided assistance with cognitive processing skills. LCSW encouraged pt to utilize grounding techniques to assist with panic attacks, deep breathing exercises, and journaling.  Plan: Follow up with behavioral health clinician on : 08/31/2021 Behavioral recommendations: Utilize grounding techniques, deep breathing, and journaling.  Referral(s): [facility] (In Clinic) and [facility] ([facility]/Outside Clinic) "From scale of 1-10, how likely are you to follow plan?": 10  Lavra Imler C Alizaya Oshea, LCSW

## 2021-08-18 NOTE — Progress Notes (Signed)
Chief Complaint: GERD  HPI : 41 year old female with history of GERD, anxiety, and athritis presents for follow up of GERD  Interval History: She is still having issues with acid reflux. She does like her epigastric ab pain has improved.  She has been trying to avoid reflux inducing foods.  She takes pantoprazole 40 mg BID about an hour before eating. Levsin did not really symptoms.  She felt like the sucralfate that she was on previously did help with her ab pain symptoms, but she wonders if there is a medication that might be more effective than the sucralfate.  She is having 2-3 BMs per day, which is looser than she was used to previously.  In the past she has had issues with constipation.  Patient has had a poor appetite.  She drinking 3 ensures per day and trying to take an additional protein beverages.  Wt Readings from Last 3 Encounters:  08/18/21 178 lb (80.7 kg)  07/15/21 178 lb 4.8 oz (80.9 kg)  07/13/21 179 lb 9.6 oz (81.5 kg)    Current Outpatient Medications  Medication Sig Dispense Refill   diclofenac Sodium (VOLTAREN) 1 % GEL Apply 2 g topically 4 (four) times daily. 100 g 0   FLUoxetine (PROZAC) 20 MG capsule Take 1 capsule (20 mg total) by mouth daily. 30 capsule 3   gabapentin (NEURONTIN) 300 MG capsule Take 1 capsule (300 mg total) by mouth 2 (two) times daily. 90 capsule 3   hydrOXYzine (ATARAX) 25 MG tablet Take 1 tablet (25 mg total) by mouth 3 (three) times daily. 60 tablet 1   hyoscyamine (LEVSIN SL) 0.125 MG SL tablet Place 1 tablet (0.125 mg total) under the tongue every 6 (six) hours as needed. 40 tablet 0   lidocaine (LIDODERM) 5 % Place 2 patches at different sites to area of pain as needed daily. Remove & Discard patch within 12 hours or as directed by MD 60 patch 1   megestrol (MEGACE) 40 MG tablet Take 1 tablet (40 mg total) by mouth daily. 30 tablet 0   ondansetron (ZOFRAN-ODT) 4 MG disintegrating tablet Take 1 tablet (4 mg total) by mouth every 8 (eight)  hours as needed for nausea or vomiting. 20 tablet 0   pantoprazole (PROTONIX) 40 MG tablet Take 1 tablet (40 mg total) by mouth 2 (two) times daily before a meal. 120 tablet 1   tizanidine (ZANAFLEX) 2 MG capsule Take 1 capsule (2 mg total) by mouth every 6 (six) hours for muscle spasm 60 capsule 0   sucralfate (CARAFATE) 1 GM/10ML suspension Take 10 mLs (1 g total) by mouth 4 (four) times daily -  with meals and at bedtime. (Patient not taking: Reported on 08/18/2021) 1200 mL 2   No current facility-administered medications for this visit.   Review of Systems: All systems reviewed and negative except where noted in HPI.   Physical Exam: BP 113/64   Pulse 64   Ht '5\' 7"'$  (1.702 m)   Wt 178 lb (80.7 kg)   BMI 27.88 kg/m  Constitutional: Pleasant,well-developed, female in no acute distress. HEENT: Normocephalic and atraumatic. Conjunctivae are normal. No scleral icterus. Cardiovascular: Normal rate, regular rhythm.  Pulmonary/chest: Effort normal and breath sounds normal. No wheezing, rales or rhonchi. Abdominal: Soft, nondistended, nontender. Bowel sounds active throughout. There are no masses palpable. No hepatomegaly. Extremities: No edema Neurological: Alert and oriented to person place and time. Skin: Skin is warm and dry. No rashes noted. Psychiatric: Normal mood and  affect. Behavior is normal.  Labs 05/2021: Lipase nml. LFTs nml. CBC and BMP nml. TSH mildly low. FT4 and FT3 nml.  Labs 06/2021: Vit D nml, Vit B12 nml. HCV Ab negative.  Barium swallow 08/26/20: IMPRESSION: Moderate gastroesophageal reflux Negative for peptic ulcer disease. 3 mm calcification to the left of L2-3 disc space is new since yesterday. Question symptoms of left renal colic.  Speech therapy evaluation 05/27/21: Pt presents with a functional dysphagia that has a significant negative impact on her life - she avoids eating in restaurants; she limits her diet to a narrow selection of foods; she has lost  weight.  During today's study, Ms. Mcphee self-limited bolus sizes (solid foods ~1/2 tspn; liquids ~ 5 ml) and required encouragement to consume >10 ml sips of liquid or tablespoon sized solid boluses.  She verbalized her concerns about increasing bolus size and became tearful and apologetic and discussed her fears related to swallowing.  Offered encouragement.  Imaging revealed normal biomechanics of the swallow with thorough mastication, timely swallow response, normal pharyngeal squeeze, reliable laryngeal vestibule closure, no penetration/aspiration, no obvious stasis in esophagus upon brief screen.  Ms. Bonenfant identifed globus sensation during the study. We watched the study together in real time; we discussed the anatomical separation of the airway from the esophagus and discussed that the feeling of pressure when eating is related to her esophagus, but that her breathing is not compromised.  She was encouraged to record a few frames of the video as affirmation of the normal mechanics of her swallowing.  We discussed recommendations, and Ms. Atkison agreed she might benefit from OP SLP to address the functional nature of her dysphagia. Recommend referral to [facility] neuro OP clinic to address the aforementioned condition.  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 - Non-bleeding gastric ulcers with a clean ulcer base (Forrest Class III). Biopsied. - Normal examined duodenum. - Biopsies were taken with a cold forceps for histology in the proximal esophagus and in the distal esophagus. Path: 1. Surgical [P], gastric antrum and gastric body - GASTRIC ANTRAL MUCOSA WITH MILD NONSPECIFIC REACTIVE GASTROPATHY - GASTRIC OXYNTIC MUCOSA WITH NO SPECIFIC HISTOPATHOLOGIC CHANGES - WARTHIN STARRY STAIN IS NEGATIVE FOR HELICOBACTER PYLORI 2. Surgical [P], distal esophagus - ESOPHAGEAL SQUAMOUS AND CARDIAC MUCOSA WITH REFLUX-ASSOCIATED CHANGES - NEGATIVE FOR INTESTINAL METAPLASIA OR DYSPLASIA 3.  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 - Normal stomach. - Normal examined duodenum. - No specimens collected.  Esophageal manometry 07/27/21: Normal.  ASSESSMENT AND PLAN: GERD Dysphagia Abdominal pain Zenker's diverticulum Patient has continued to struggle with acid reflux symptoms despite being on PPI twice daily.  Will give GERD handout on suggestions for how to reduce her acid reflux symptoms.  We will have the patient start baclofen 5 mg twice daily to see if this helps with her symptoms.  Refilled her Zofran today.  Patient does describe looser stools than she had previously.  I wonder if having overflow diarrhea constipation, which could also potentially explain why she has been seen on KUB last year to have food in her stomach. Her CT renal study from 2018 does show significant stool burden at that time.  Thus we will try to start her on MiraLAX daily and fiber supplement daily to see if this will help with some of her reflux symptoms. - GERD handout -  Continue pantoprazole 40 mg BID - Start baclofen 5 mg BID - Refilled Zofran - Start Miralax QD - Start daily fiber supplement - RTC 1 month. Consider colonoscopy and/or CT A/P in the future.  Christia Reading, MD  I spent 41 minutes of time, including in depth chart review, independent review of results as outlined above, communicating results with the patient directly, face-to-face time with the patient, coordinating care, ordering studies and medications as appropriate, and documentation.

## 2021-08-18 NOTE — Addendum Note (Signed)
Addended by: Charlott Rakes on: 08/18/2021 01:34 PM   Modules accepted: Orders

## 2021-08-18 NOTE — Telephone Encounter (Signed)
Hey Dr. Margarita Rana, I met with this pt yesterday and believe she needs an adjustment in her anxiety medication as she experiences panic attacks daily. Can you submit a formal referral to [facility]? I am also providing pt with walk-in hours just to see how soon she can get in. Thanks.

## 2021-08-18 NOTE — Patient Instructions (Signed)
We have sent the following medications to your pharmacy for you to pick up at your convenience: Baclofen   Take Miralax daily  Use daily fiber daily  Gastroesophageal Reflux Disease, Adult Gastroesophageal reflux (GER) happens when acid from the stomach flows up into the tube that connects the mouth and the stomach (esophagus). Normally, food travels down the esophagus and stays in the stomach to be digested. However, when a person has GER, food and stomach acid sometimes move back up into the esophagus. If this becomes a more serious problem, the person may be diagnosed with a disease called gastroesophageal reflux disease (GERD). GERD occurs when the reflux: Happens often. Causes frequent or severe symptoms. Causes problems such as damage to the esophagus. When stomach acid comes in contact with the esophagus, the acid may cause inflammation in the esophagus. Over time, GERD may create small holes (ulcers) in the lining of the esophagus. What are the causes? This condition is caused by a problem with the muscle between the esophagus and the stomach (lower esophageal sphincter, or LES). Normally, the LES muscle closes after food passes through the esophagus to the stomach. When the LES is weakened or abnormal, it does not close properly, and that allows food and stomach acid to go back up into the esophagus. The LES can be weakened by certain dietary substances, medicines, and medical conditions, including: Tobacco use. Pregnancy. Having a hiatal hernia. Alcohol use. Certain foods and beverages, such as coffee, chocolate, onions, and peppermint. What increases the risk? You are more likely to develop this condition if you: Have an increased body weight. Have a connective tissue disorder. Take NSAIDs, such as ibuprofen. What are the signs or symptoms? Symptoms of this condition include: Heartburn. Difficult or painful swallowing and the feeling of having a lump in the throat. A bitter  taste in the mouth. Bad breath and having a large amount of saliva. Having an upset or bloated stomach and belching. Chest pain. Different conditions can cause chest pain. Make sure you see your health care provider if you experience chest pain. Shortness of breath or wheezing. Ongoing (chronic) cough or a nighttime cough. Wearing away of tooth enamel. Weight loss. How is this diagnosed? This condition may be diagnosed based on a medical history and a physical exam. To determine if you have mild or severe GERD, your health care provider may also monitor how you respond to treatment. You may also have tests, including: A test to examine your stomach and esophagus with a small camera (endoscopy). A test that measures the acidity level in your esophagus. A test that measures how much pressure is on your esophagus. A barium swallow or modified barium swallow test to show the shape, size, and functioning of your esophagus. How is this treated? Treatment for this condition may vary depending on how severe your symptoms are. Your health care provider may recommend: Changes to your diet. Medicine. Surgery. The goal of treatment is to help relieve your symptoms and to prevent complications. Follow these instructions at home: Eating and drinking  Follow a diet as recommended by your health care provider. This may involve avoiding foods and drinks such as: Coffee and tea, with or without caffeine. Drinks that contain alcohol. Energy drinks and sports drinks. Carbonated drinks or sodas. Chocolate and cocoa. Peppermint and mint flavorings. Garlic and onions. Horseradish. Spicy and acidic foods, including peppers, chili powder, curry powder, vinegar, hot sauces, and barbecue sauce. Citrus fruit juices and citrus fruits, such as oranges, lemons,  and limes. Tomato-based foods, such as red sauce, chili, salsa, and pizza with red sauce. Fried and fatty foods, such as donuts, french fries, potato  chips, and high-fat dressings. High-fat meats, such as hot dogs and fatty cuts of red and white meats, such as rib eye steak, sausage, ham, and bacon. High-fat dairy items, such as whole milk, butter, and cream cheese. Eat small, frequent meals instead of large meals. Avoid drinking large amounts of liquid with your meals. Avoid eating meals during the 2-3 hours before bedtime. Avoid lying down right after you eat. Do not exercise right after you eat. Lifestyle  Do not use any products that contain nicotine or tobacco. These products include cigarettes, chewing tobacco, and vaping devices, such as e-cigarettes. If you need help quitting, ask your health care provider. Try to reduce your stress by using methods such as yoga or meditation. If you need help reducing stress, ask your health care provider. If you are overweight, reduce your weight to an amount that is healthy for you. Ask your health care provider for guidance about a safe weight loss goal. General instructions Pay attention to any changes in your symptoms. Take over-the-counter and prescription medicines only as told by your health care provider. Do not take aspirin, ibuprofen, or other NSAIDs unless your health care provider told you to take these medicines. Wear loose-fitting clothing. Do not wear anything tight around your waist that causes pressure on your abdomen. Raise (elevate) the head of your bed about 6 inches (15 cm). You can use a wedge to do this. Avoid bending over if this makes your symptoms worse. Keep all follow-up visits. This is important. Contact a health care provider if: You have: New symptoms. Unexplained weight loss. Difficulty swallowing or it hurts to swallow. Wheezing or a persistent cough. A hoarse voice. Your symptoms do not improve with treatment. Get help right away if: You have sudden pain in your arms, neck, jaw, teeth, or back. You suddenly feel sweaty, dizzy, or light-headed. You have  chest pain or shortness of breath. You vomit and the vomit is green, yellow, or black, or it looks like blood or coffee grounds. You faint. You have stool that is red, bloody, or black. You cannot swallow, drink, or eat. These symptoms may represent a serious problem that is an emergency. Do not wait to see if the symptoms will go away. Get medical help right away. Call your local emergency services (911 in the [place] Do not drive yourself to the hospital. Summary Gastroesophageal reflux happens when acid from the stomach flows up into the esophagus. GERD is a disease in which the reflux happens often, causes frequent or severe symptoms, or causes problems such as damage to the esophagus. Treatment for this condition may vary depending on how severe your symptoms are. Your health care provider may recommend diet and lifestyle changes, medicine, or surgery. Contact a health care provider if you have new or worsening symptoms. Take over-the-counter and prescription medicines only as told by your health care provider. Do not take aspirin, ibuprofen, or other NSAIDs unless your health care provider told you to do so. Keep all follow-up visits as told by your health care provider. This is important. This information is not intended to replace advice given to you by your health care provider. Make sure you discuss any questions you have with your health care provider. Document Revised: 12/08/2019 Document Reviewed: 12/08/2019 Elsevier Patient Education  [place].   If you are age  51 or older, your body mass index should be between 23-30. Your Body mass index is 27.88 kg/m. If this is out of the aforementioned range listed, please consider follow up with your Primary Care Provider.  If you are age 13 or younger, your body mass index should be between 19-25. Your Body mass index is 27.88 kg/m. If this is out of the aformentioned range listed, please consider follow up with your Primary Care  Provider.   ________________________________________________________  The [facility] providers would like to encourage you to use [facility] to communicate with providers for non-urgent requests or questions.  Due to long hold times on the telephone, sending your provider a message by [facility] may be a faster and more efficient way to get a response.  Please allow 48 business hours for a response.  Please remember that this is for non-urgent requests.  _______________________________________________________   I appreciate the  opportunity to care for you  Thank You   Sonny Masters Dorsey,MD

## 2021-08-19 ENCOUNTER — Other Ambulatory Visit: Payer: Self-pay

## 2021-08-23 ENCOUNTER — Ambulatory Visit: Payer: 59

## 2021-08-23 ENCOUNTER — Other Ambulatory Visit: Payer: Self-pay

## 2021-08-23 DIAGNOSIS — G540 Brachial plexus disorders: Secondary | ICD-10-CM

## 2021-08-23 DIAGNOSIS — M542 Cervicalgia: Secondary | ICD-10-CM

## 2021-08-23 NOTE — Therapy (Signed)
OUTPATIENT PHYSICAL THERAPY TREATMENT NOTE   Patient Name: Laurie Bell MRN: [id number] DOB:27-Aug-1980, 41 y.o., female Today's Date: 08/23/2021  PCP: Charlott Rakes, MD REFERRING PROVIDER: Charlott Rakes, MD   PT End of Session - 08/23/21 1613     Visit Number 4    Number of Visits 6    Date for PT Re-Evaluation 09/14/21    Authorization Type [employer]    Progress Note Due on Visit 6    PT Start Time 1615    PT Stop Time 1700    PT Time Calculation (min) 45 min    Activity Tolerance Patient tolerated treatment well    Behavior During Therapy [facility] for tasks assessed/performed             Past Medical History:  Diagnosis Date   Anxiety    Arthritis    Chlamydia contact, treated    GERD (gastroesophageal reflux disease)    Past Surgical History:  Procedure Laterality Date   ENDOMETRIAL ABLATION N/A 04/25/2017   Procedure: ENDOMETRIAL ABLATION With NOVASURE;  Surgeon: Woodroe Mode, MD;  Location: [place];  Service: Gynecology;  Laterality: N/A;   ESOPHAGEAL MANOMETRY N/A 07/27/2021   Procedure: ESOPHAGEAL MANOMETRY (EM);  Surgeon: Sharyn Creamer, MD;  Location: [facility];  Service: Gastroenterology;  Laterality: N/A;   TUBAL LIGATION     Patient Active Problem List   Diagnosis Date Noted   Irregular heart rhythm 04/15/2021   Numbness on left side 04/15/2021   Dysphagia 03/17/2021   Hypomagnesemia 02/23/2021   Gastroesophageal reflux disease    Hypokalemia 08/24/2020   Atypical chest pain 08/24/2020   Nausea and vomiting 08/24/2020   Hypocalcemia 08/24/2020   DUB (dysfunctional uterine bleeding) 01/12/2017   Fibroid, uterine 01/12/2017    REFERRING DIAG: Cervicalgia  Thoracic outlet syndrome  THERAPY DIAG:  Cervicalgia  Thoracic outlet syndrome  PERTINENT HISTORY: unremarkable  PRECAUTIONS: none  SUBJECTIVE: Has not yet received lidocaine pain patches as they are out of stock.  Symptoms improved, L hand  numbness has improved but pain remains at 7/10.  PAIN:  Are you having pain? Yes NPRS scale: 7/10 Pain location: L arm Pain orientation: Left  PAIN TYPE: aching Pain description: burning  Aggravating factors: activity Relieving factors: rest      OBJECTIVE:    DIAGNOSTIC FINDINGS:  Unremarkable brain MRI   PATIENT SURVEYS:  FOTO 23     COGNITION: Overall cognitive status: Within functional limits for tasks assessed            SENSATION: Light touch: Appears intact   POSTURE:  Elevated L first rib   PALPATION: Tenderness to first rib and scalene group L         CERVICAL AROM/PROM   A/PROM A/PROM (deg) 08/03/2021  Flexion 75%  Extension 75%  Right lateral flexion WFL  Left lateral flexion WFL  Right rotation [facility]  Left rotation 75%   (Blank rows = not tested)   UE AROM/PROM:  WNL throughout   A/PROM Right 08/03/2021 Left 08/03/2021  Shoulder flexion      Shoulder extension      Shoulder abduction      Shoulder adduction      Shoulder extension      Shoulder internal rotation      Shoulder external rotation      Elbow flexion      Elbow extension      Wrist flexion      Wrist extension  Wrist ulnar deviation      Wrist radial deviation      Wrist pronation      Wrist supination          UE MMT:   MMT Right 08/03/2021 Left 08/03/2021  Shoulder flexion 5 5  Shoulder extension      Shoulder abduction 5 5  Shoulder adduction      Shoulder extension      Shoulder internal rotation 5 5  Shoulder external rotation 5 5  Middle trapezius      Lower trapezius      Elbow flexion 5 5  Elbow extension 5 5  Wrist flexion      Wrist extension      Wrist ulnar deviation      Wrist radial deviation      Wrist pronation      Wrist supination      Grip strength 31 25   (Blank rows = not tested)   CERVICAL SPECIAL TESTS:  Upper limb tension test (ULTT): Positive     FUNCTIONAL TESTS:  Not applicable        TODAY'S TREATMENT:  [facility] Adult  PT Treatment:                                                DATE: 08/23/21 Therapeutic Exercise: UBE 3/3 L1 Supine L shoulder flexion 15x Supine L shoulder press and protraction 15x SL ER L 15x SL L abduction 15x Prone ext 15x Prone flexion 15x Prone hor abd IR/ER 10/10 Prone scaption Manual Thaerapy: L scalene stretch 30s x3 L first rib MET   [facility] Adult PT Treatment:                                                DATE: 08/18/21 Therapeutic Exercise: UBE 3/3 L1 Shrugs 15x Scapular retractions 5x Scapular depression from table 15x Latissimus press with breathing patterns emphasized 10x L UT stretch 30sx3 Grip strength 30# R, 40# L TPDN to scalene and UT on L performed by Carlus Pavlov PT Trigger Point Dry Needling Treatment: Pre-treatment instruction: Patient instructed on dry needling rationale, procedures, and possible side effects including pain during treatment (achy,cramping feeling), bruising, drop of blood, lightheadedness, nausea, sweating. Patient Consent Given: Yes Education handout provided: No Muscles treated: L scalenes and upper trap  Electrical stimulation performed: No Parameters: N/A Treatment response/outcome: Twitch response elicited Post-treatment instructions: Patient instructed to expect possible mild to moderate muscle soreness later today and/or tomorrow. Patient instructed in methods to reduce muscle soreness and to continue prescribed HEP. If patient was dry needled over the lung field, patient was instructed on signs and symptoms of pneumothorax and, however unlikely, to see immediate medical attention should they occur. Patient was also educated on signs and symptoms of infection and to seek medical attention should they occur. Patient verbalized understanding of these instructions and education.    [facility] Adult PT Treatment:                                                DATE: 08/10/21 Therapeutic Exercise: UBE  L1 3/3 min Manual Therapy: Scalene stretches  30s x3 all 3 heads. STM to L first rib, pec minor release, MET to L first rib into shoulder flexion in supine 10x. SO release 5 min.      PATIENT EDUCATION:  Education details: Discussed eval findings, rehab rationale and POC and patient is in agreement  Person educated: Patient Education method: Explanation, Demonstration, and Handouts Education comprehension: verbalized understanding, returned demonstration, tactile cues required, and needs further education     HOME EXERCISE PROGRAM: Access Code: [email or web address] URL: [email or web address] Date: 08/10/2021 Prepared by: Sharlynn Oliphant  Exercises Seated Shoulder Shrugs - 2 x daily - 7 x weekly - 2 sets - 10 reps Seated Scapular Retraction - 2 x daily - 7 x weekly - 2 sets - 10 reps Seated Upper Trapezius Stretch - 2 x daily - 7 x weekly - 2 sets - 10 reps    ASSESSMENT:   CLINICAL IMPRESSION: Favorable response to TPDN last session, symptoms improved but not resolved.  Today's session began L shoulder strengthening with weakness noted throughout posterior shoulder girdle, most prominent during eccentric phase.  Limited tolerance to manual stretching of scalenes, attempted L first rib MET.  OBJECTIVE IMPAIRMENTS decreased knowledge of condition, decreased mobility, decreased ROM, decreased strength, impaired UE functional use, postural dysfunction, and pain.    ACTIVITY LIMITATIONS cleaning, occupation, and LUE use .    PERSONAL FACTORS Behavior pattern, Profession, and Time since onset of injury/illness/exacerbation are also affecting patient's functional outcome.      REHAB POTENTIAL: Good   CLINICAL DECISION MAKING: Stable/uncomplicated   EVALUATION COMPLEXITY: Moderate     GOALS: Goals reviewed with patient? Yes   SHORT TERM GOALS:   STG Name Target Date Goal status  1 Patient to demonstrate independence in HEP  Baseline: Self first rib w/tennis ball into L shoulder flexion in sit/supine 08/25/2021 Met  2  Expand HEP Baseline: TBD; 08/23/21 [email or web address] 08/25/2021 Met    LONG TERM GOALS:    LTG Name Target Date Goal status  1 Increase FOTO score to 51 Baseline: 23 09/14/2021 INITIAL  2 Decrease pain to 2/10 Baseline: 10/10 09/14/2021 INITIAL  3 Increase L grip strength to 30# Baseline: 25# 09/15/2021 INITIAL  4 Increase L cervical rotation to 90% Baseline: 75% 09/15/2021 INITIAL  PLAN: PT FREQUENCY: 1x/week   PT DURATION: 6 weeks   PLANNED INTERVENTIONS: Therapeutic exercises, Therapeutic activity, Neuro Muscular re-education, Balance training, Gait training, Patient/Family education, Joint mobilization, Dry Needling, and Manual therapy   PLAN FOR NEXT SESSION: stretching and strengthening of postural muscle groups, posterior shoulder strengthening, fist rib MET, L cervical stretching     Lanice Shirts, PT 08/23/2021, 4:17 PM

## 2021-08-26 ENCOUNTER — Telehealth: Payer: Self-pay | Admitting: Internal Medicine

## 2021-08-26 ENCOUNTER — Other Ambulatory Visit: Payer: Self-pay

## 2021-08-26 ENCOUNTER — Other Ambulatory Visit: Payer: Self-pay | Admitting: Internal Medicine

## 2021-08-26 ENCOUNTER — Telehealth: Payer: Self-pay | Admitting: Family Medicine

## 2021-08-26 ENCOUNTER — Other Ambulatory Visit: Payer: Self-pay | Admitting: Family Medicine

## 2021-08-26 DIAGNOSIS — K219 Gastro-esophageal reflux disease without esophagitis: Secondary | ICD-10-CM

## 2021-08-26 MED ORDER — LIDOCAINE 5 % EX PTCH
1.0000 | MEDICATED_PATCH | CUTANEOUS | 3 refills | Status: DC
  Filled 2021-08-26 – 2021-09-09 (×3): qty 30, 30d supply, fill #0
  Filled 2021-11-17: qty 30, 30d supply, fill #1

## 2021-08-26 MED ORDER — PANTOPRAZOLE SODIUM 40 MG PO TBEC
40.0000 mg | DELAYED_RELEASE_TABLET | Freq: Two times a day (BID) | ORAL | 1 refills | Status: DC
  Filled 2021-08-26: qty 60, 30d supply, fill #0
  Filled 2021-09-28: qty 30, 30d supply, fill #1

## 2021-08-26 NOTE — Telephone Encounter (Signed)
Pt is requesting patches be changed back to 1 instead of 2

## 2021-08-26 NOTE — Telephone Encounter (Signed)
Copied from [place] [id number]. Topic: General - Other >> Aug 26, 2021  8:35 AM Laurie Bell wrote: Reason for CRM: The patient has been directed by their pharmacy to contact their PCP and request for their Rx #: [id number] lidocaine (LIDODERM) 5 % [[id number]] prescription to be rewritten with directions for 1 patch to be used rather than 2   The patient's insurance will not cover the prescription for two patches to be used   The patient has stressed the urgency of their request  Please contact further when possible

## 2021-08-26 NOTE — Telephone Encounter (Signed)
Patient called in this morning requesting a refill on her pantoprazole as she is out.  The pharmacy will probably also be reaching out for a refill for her.  She is asking if we could please call that in for her today.  Thank you.

## 2021-08-26 NOTE — Telephone Encounter (Signed)
Done

## 2021-08-26 NOTE — Telephone Encounter (Signed)
Requested medication (s) are due for refill today:   Yes  Requested medication (s) are on the active medication list:   Yes  Future visit scheduled:   Yes   Last ordered: 07/27/2021 #60, 1 refill  See pharmacy note for reason returned.    Pt needs new Rx.   Requested Prescriptions  Pending Prescriptions Disp Refills   lidocaine (LIDODERM) 5 % 60 patch 1    Sig: Place 2 patches at different sites to area of pain as needed daily. Remove & Discard patch within 12 hours or as directed by MD     Analgesics:  Topicals Failed - 08/26/2021  2:56 PM      Failed - Manual Review: Labs are only required if the patient has taken medication for more than 8 weeks.      Passed - PLT in normal range and within 360 days    Platelets  Date Value Ref Range Status  06/09/2021 294 150 - 400 K/uL Final          Passed - HGB in normal range and within 360 days    Hemoglobin  Date Value Ref Range Status  06/09/2021 13.3 12.0 - 15.0 g/dL Final          Passed - HCT in normal range and within 360 days    HCT  Date Value Ref Range Status  06/09/2021 39.4 36.0 - 46.0 % Final          Passed - Cr in normal range and within 360 days    Creatinine, Ser  Date Value Ref Range Status  06/09/2021 0.81 0.44 - 1.00 mg/dL Final          Passed - eGFR is 30 or above and within 360 days    GFR calc Af Amer  Date Value Ref Range Status  02/01/2020 >60 >60 mL/min Final   GFR, Estimated  Date Value Ref Range Status  06/09/2021 >60 >60 mL/min Final    Comment:    (NOTE) Calculated using the CKD-EPI Creatinine Equation (2021)    eGFR  Date Value Ref Range Status  04/15/2021 95 >59 mL/min/1.73 Final          Passed - Patient is not pregnant      Passed - Valid encounter within last 12 months    Recent Outpatient Visits           1 month ago Cervical radiculopathy   [place], [place], MD   2 months ago Cervical radiculopathy   [place], Charlane Ferretti, MD   2 months ago Decreased appetite   [facility], MD   3 months ago Abnormal TSH   [facility], MD   3 months ago Atypical chest pain   [facility], Kriste Basque, NP       Future Appointments             In 2 weeks Kirsteins, Luanna Salk, MD [facility], CPR   In 1 month Charlott Rakes, MD [place]

## 2021-08-29 ENCOUNTER — Other Ambulatory Visit: Payer: Self-pay

## 2021-08-29 ENCOUNTER — Encounter (HOSPITAL_COMMUNITY): Payer: Self-pay | Admitting: *Deleted

## 2021-08-29 ENCOUNTER — Ambulatory Visit (HOSPITAL_COMMUNITY)
Admission: EM | Admit: 2021-08-29 | Discharge: 2021-08-29 | Disposition: A | Discharge status: Home / Self Care | Payer: 59 | Attending: Family Medicine | Admitting: Family Medicine

## 2021-08-29 DIAGNOSIS — J019 Acute sinusitis, unspecified: Secondary | ICD-10-CM

## 2021-08-29 DIAGNOSIS — N898 Other specified noninflammatory disorders of vagina: Secondary | ICD-10-CM | POA: Diagnosis not present

## 2021-08-29 DIAGNOSIS — B9689 Other specified bacterial agents as the cause of diseases classified elsewhere: Secondary | ICD-10-CM | POA: Diagnosis present

## 2021-08-29 LAB — POCT URINALYSIS DIPSTICK, ED / UC
Bilirubin Urine: NEGATIVE
Glucose, UA: NEGATIVE mg/dL
Hgb urine dipstick: NEGATIVE
Ketones, ur: NEGATIVE mg/dL
Leukocytes,Ua: NEGATIVE
Nitrite: NEGATIVE
Protein, ur: NEGATIVE mg/dL
Specific Gravity, Urine: 1.02 (ref 1.005–1.030)
Urobilinogen, UA: 0.2 mg/dL (ref 0.0–1.0)
pH: 7 (ref 5.0–8.0)

## 2021-08-29 LAB — POC URINE PREG, ED: Preg Test, Ur: NEGATIVE

## 2021-08-29 MED ORDER — AMOXICILLIN-POT CLAVULANATE 875-125 MG PO TABS: 1.0000 | ORAL_TABLET | Freq: Two times a day (BID) | ORAL | 0 refills | Status: AC

## 2021-08-29 MED ORDER — FLUCONAZOLE 150 MG PO TABS: 150.0000 mg | ORAL_TABLET | Freq: Once | ORAL | 0 refills | Status: AC

## 2021-08-29 NOTE — Discharge Instructions (Addendum)
-   Please go ahead and start the antibiotics for the sinus infection -You can take the Diflucan after he completed the entire course of antibiotics -We will let you know with any positive results from the self swab within the next day or so -Please continue pushing hydration with plenty of water.  You can also use nasal saline to help with nasal congestion -Follow-up if your symptoms worsen or do not improve with the prescribed treatment

## 2021-08-29 NOTE — ED Triage Notes (Signed)
Pt reports having a scratchy throat, increased mucous the patient also has vag.discharge along with dysuria.

## 2021-08-29 NOTE — ED Provider Notes (Signed)
[place]    CSN: [id number] Arrival date & time: 08/29/21  1542      History   Chief Complaint Chief Complaint  Patient presents with   scratchy throat   increased mucous production   Dysuria   Vaginal Discharge    HPI Laurie Bell is a 41 y.o. female.   Patient presents with 2 concerns.  First, she reports congestion and scratchy throat that has been going on for over 1 week.  She reports headache, thick nasal congestion and pain in her face.  She denies fevers, body aches, chills, ear pain or drainage, nausea, vomiting, diarrhea.  She denies any eye redness or itching, nasal itching.  Does report a history of seasonal allergies and a history of sinus infections.   Also reports vaginal discharge that started the past couple of days.  She describes the discharge as white/yellow and slightly malodorous.  She also complains of burning with urination.  She denies vaginal sores, lesions, rashes.  She denies hematuria, increased urinary frequency, urgency.  Denies any new back pain or abdominal pain.   Past Medical History:  Diagnosis Date   Anxiety    Arthritis    Chlamydia contact, treated    GERD (gastroesophageal reflux disease)     Patient Active Problem List   Diagnosis Date Noted   Irregular heart rhythm 04/15/2021   Numbness on left side 04/15/2021   Dysphagia 03/17/2021   Hypomagnesemia 02/23/2021   Gastroesophageal reflux disease    Hypokalemia 08/24/2020   Atypical chest pain 08/24/2020   Nausea and vomiting 08/24/2020   Hypocalcemia 08/24/2020   DUB (dysfunctional uterine bleeding) 01/12/2017   Fibroid, uterine 01/12/2017    Past Surgical History:  Procedure Laterality Date   ENDOMETRIAL ABLATION N/A 04/25/2017   Procedure: ENDOMETRIAL ABLATION With NOVASURE;  Surgeon: Woodroe Mode, MD;  Location: [place];  Service: Gynecology;  Laterality: N/A;   ESOPHAGEAL MANOMETRY N/A 07/27/2021   Procedure: ESOPHAGEAL MANOMETRY  (EM);  Surgeon: Sharyn Creamer, MD;  [facility];  Service: Gastroenterology;  Laterality: N/A;   TUBAL LIGATION      OB History     Gravida  2   Para  2   Term  2   Preterm  0   AB  0   Living  2      SAB  0   IAB  0   Ectopic  0   Multiple  0   Live Births               Home Medications    Prior to Admission medications   Medication Sig Start Date End Date Taking? Authorizing Provider  amoxicillin-clavulanate (AUGMENTIN) 875-125 MG tablet Take 1 tablet by mouth 2 (two) times daily for 7 days. 08/29/21 09/05/21 Yes Eulogio Bear, NP  fluconazole (DIFLUCAN) 150 MG tablet Take 1 tablet (150 mg total) by mouth once for 1 dose. 08/29/21 08/29/21 Yes Eulogio Bear, NP  baclofen (LIORESAL) 10 MG tablet Take 1/2 tablet twice daily with meals 08/18/21   Sharyn Creamer, MD  diclofenac Sodium (VOLTAREN) 1 % GEL Apply 2 g topically 4 (four) times daily. 06/10/21   Rancour, Annie Main, MD  FLUoxetine (PROZAC) 40 MG capsule Take 1 capsule (40 mg total) by mouth daily. 08/18/21   Charlott Rakes, MD  gabapentin (NEURONTIN) 300 MG capsule Take 1 capsule (300 mg total) by mouth 2 (two) times daily. 05/02/21   [name], NP  hydrOXYzine (ATARAX) 25 MG tablet Take 1 tablet (25 mg total) by mouth 3 (three) times daily. 07/22/21   Charlott Rakes, MD  hyoscyamine (LEVSIN SL) 0.125 MG SL tablet Place 1 tablet (0.125 mg total) under the tongue every 6 (six) hours as needed. 05/25/21   Esterwood, Amy S, PA-C  lidocaine (LIDODERM) 5 % Place 1 patch onto the skin daily. 08/26/21   Charlott Rakes, MD  megestrol (MEGACE) 40 MG tablet Take 1 tablet (40 mg total) by mouth daily. 05/31/21   Dorna Mai, MD  ondansetron (ZOFRAN-ODT) 4 MG disintegrating tablet Take 1 tablet (4 mg total) by mouth every 8 (eight) hours as needed for nausea or vomiting. 08/18/21   Sharyn Creamer, MD  pantoprazole (PROTONIX) 40 MG tablet Take 1 tablet (40 mg total) by mouth 2 (two) times daily  before a meal. 08/26/21   Sharyn Creamer, MD  sucralfate (CARAFATE) 1 GM/10ML suspension Take 10 mLs (1 g total) by mouth 4 (four) times daily -  with meals and at bedtime. Patient not taking: Reported on 08/18/2021 05/25/21   Alfredia Ferguson, PA-C  tizanidine (ZANAFLEX) 2 MG capsule Take 1 capsule (2 mg total) by mouth every 6 (six) hours for muscle spasm 07/11/21     medroxyPROGESTERone (PROVERA) 10 MG tablet Take 2 tablets by mouth daily Patient not taking: Reported on 06/05/2019 02/06/18 02/02/20  Sloan Leiter, MD    Family History Family History  Problem Relation Age of Onset   Hypertension Mother    Hypertension Maternal Aunt    Cancer Maternal Grandmother        type unknown   Esophageal cancer Neg Hx    Rectal cancer Neg Hx    Stomach cancer Neg Hx    Colon cancer Neg Hx     Social History Social History   Tobacco Use   Smoking status: Never   Smokeless tobacco: Never  Vaping Use   Vaping Use: Never used  Substance Use Topics   Alcohol use: No   Drug use: Not Currently    Types: Marijuana    Comment: quit 2018     Allergies   Latex   Review of Systems Review of Systems Per HPI  Physical Exam Triage Vital Signs ED Triage Vitals  Enc Vitals Group     BP 08/29/21 1727 127/83     Pulse Rate 08/29/21 1727 87     Resp 08/29/21 1727 18     Temp 08/29/21 1727 98.5 F (36.9 C)     Temp src --      SpO2 08/29/21 1727 98 %     Weight --      Height --      Head Circumference --      Peak Flow --      Pain Score 08/29/21 1724 6     Pain Loc --      Pain Edu? --      Excl. in [place]? --    No data found.  Updated Vital Signs BP 127/83   Pulse 87   Temp 98.5 F (36.9 C)   Resp 18   LMP 08/14/2021 (Approximate)   SpO2 98%   Visual Acuity Right Eye Distance:   Left Eye Distance:   Bilateral Distance:    Right Eye Near:   Left Eye Near:    Bilateral Near:     Physical Exam Vitals and nursing note reviewed.  Constitutional:      General: She is  not in acute distress.    Appearance: Normal appearance. She is not ill-appearing or toxic-appearing.  HENT:     Head: Normocephalic and atraumatic.     Right Ear: Tympanic membrane, ear canal and external ear normal.     Left Ear: Tympanic membrane, ear canal and external ear normal.     Nose: No congestion or rhinorrhea.     Right Sinus: Maxillary sinus tenderness and frontal sinus tenderness present.     Left Sinus: Maxillary sinus tenderness and frontal sinus tenderness present.     Mouth/Throat:     Mouth: Mucous membranes are moist.     Pharynx: Oropharynx is clear. Posterior oropharyngeal erythema present. No oropharyngeal exudate.  Eyes:     General: No scleral icterus.    Extraocular Movements: Extraocular movements intact.  Cardiovascular:     Rate and Rhythm: Normal rate and regular rhythm.  Pulmonary:     Effort: Pulmonary effort is normal. No respiratory distress.     Breath sounds: Normal breath sounds. No wheezing, rhonchi or rales.  Musculoskeletal:     Cervical back: Normal range of motion and neck supple.  Lymphadenopathy:     Cervical: No cervical adenopathy.  Skin:    General: Skin is warm and dry.     Coloration: Skin is not jaundiced or pale.     Findings: No erythema or rash.  Neurological:     Mental Status: She is alert and oriented to person, place, and time.     Motor: No weakness.  Psychiatric:        Mood and Affect: Mood normal.        Behavior: Behavior normal.     UC Treatments / Results  Labs (all labs ordered are listed, but only abnormal results are displayed) Labs Reviewed  POCT URINALYSIS DIPSTICK, ED / UC  POC URINE PREG, ED  CERVICOVAGINAL ANCILLARY ONLY    EKG   Radiology No results found.  Procedures Procedures (including critical care time)  Medications Ordered in UC Medications - No data to display  Initial Impression / Assessment and Plan / UC Course  I have reviewed the triage vital signs and the nursing  notes.  Pertinent labs & imaging results that were available during my care of the patient were reviewed by me and considered in my medical decision making (see chart for details).    Treat bacterial sinus infection with Augmentin twice daily for 7 days.  Continue with plenty of hydration and discussed use of nasal saline. We will give a dose of oral Diflucan given history of frequent yeast infections after antibiotic use.  Discussed that she should take this after completing all antibiotic therapy.  We will also test self swab today for yeast, bacterial vaginosis, gonorrhea, chlamydia, and trichomonas.  Patient declines HIV and syphilis testing.  Treat as indicated.  Discussed with patient that urinalysis today is negative for an acute urinary tract infection. Final Clinical Impressions(s) / UC Diagnoses   Final diagnoses:  Acute bacterial sinusitis  Vaginal discharge     Discharge Instructions      - Please go ahead and start the antibiotics for the sinus infection -You can take the Diflucan after he completed the entire course of antibiotics -We will let you know with any positive results from the self swab within the next day or so -Please continue pushing hydration with plenty of water.  You can also use nasal saline to help with nasal congestion -Follow-up if your symptoms worsen or  do not improve with the prescribed treatment     ED Prescriptions     Medication Sig Dispense Auth. Provider   amoxicillin-clavulanate (AUGMENTIN) 875-125 MG tablet Take 1 tablet by mouth 2 (two) times daily for 7 days. 14 tablet Noemi Chapel A, NP   fluconazole (DIFLUCAN) 150 MG tablet Take 1 tablet (150 mg total) by mouth once for 1 dose. 1 tablet Eulogio Bear, NP      PDMP not reviewed this encounter.   Eulogio Bear, NP 08/29/21 1819

## 2021-08-30 ENCOUNTER — Ambulatory Visit: Payer: 59

## 2021-08-30 DIAGNOSIS — M542 Cervicalgia: Secondary | ICD-10-CM | POA: Diagnosis not present

## 2021-08-30 DIAGNOSIS — G540 Brachial plexus disorders: Secondary | ICD-10-CM

## 2021-08-30 NOTE — Therapy (Signed)
OUTPATIENT PHYSICAL THERAPY TREATMENT NOTE   Patient Name: Laurie Bell MRN: [id number] DOB:01-24-1981, 41 y.o., female Today's Date: 08/30/2021  PCP: Charlott Rakes, MD REFERRING PROVIDER: Charlott Rakes, MD   PT End of Session - 08/30/21 1620     Visit Number 5    Number of Visits 6    Date for PT Re-Evaluation 09/14/21    Authorization Type [employer]    Progress Note Due on Visit 6    PT Start Time 1620    PT Stop Time 1700    PT Time Calculation (min) 40 min    Activity Tolerance Patient tolerated treatment well    Behavior During Therapy [employer] for tasks assessed/performed             Past Medical History:  Diagnosis Date   Anxiety    Arthritis    Chlamydia contact, treated    GERD (gastroesophageal reflux disease)    Past Surgical History:  Procedure Laterality Date   ENDOMETRIAL ABLATION N/A 04/25/2017   Procedure: ENDOMETRIAL ABLATION With NOVASURE;  Surgeon: Woodroe Mode, MD;  Location: [place];  Service: Gynecology;  Laterality: N/A;   ESOPHAGEAL MANOMETRY N/A 07/27/2021   Procedure: ESOPHAGEAL MANOMETRY (EM);  Surgeon: Sharyn Creamer, MD;  Location[facility];  Service: Gastroenterology;  Laterality: N/A;   TUBAL LIGATION     Patient Active Problem List   Diagnosis Date Noted   Irregular heart rhythm 04/15/2021   Numbness on left side 04/15/2021   Dysphagia 03/17/2021   Hypomagnesemia 02/23/2021   Gastroesophageal reflux disease    Hypokalemia 08/24/2020   Atypical chest pain 08/24/2020   Nausea and vomiting 08/24/2020   Hypocalcemia 08/24/2020   DUB (dysfunctional uterine bleeding) 01/12/2017   Fibroid, uterine 01/12/2017    REFERRING DIAG: Cervicalgia  Thoracic outlet syndrome  THERAPY DIAG:  Cervicalgia  Thoracic outlet syndrome  PERTINENT HISTORY: unremarkable  PRECAUTIONS: none  SUBJECTIVE: Had several days of minimal symptoms including no numbness in her hand but symptoms resumed w/o any  aggravating factor.  Feels stress may be a factor as her acid reflux is related to her exacerbations.  PAIN:  Are you having pain? Yes NPRS scale: 7/10 Pain location: L arm Pain orientation: Left  PAIN TYPE: aching Pain description: burning  Aggravating factors: activity Relieving factors: rest      OBJECTIVE:    DIAGNOSTIC FINDINGS:  Unremarkable brain MRI   PATIENT SURVEYS:  FOTO 23     COGNITION: Overall cognitive status: Within functional limits for tasks assessed            SENSATION: Light touch: Appears intact   POSTURE:  Elevated L first rib   PALPATION: Tenderness to first rib and scalene group L         CERVICAL AROM/PROM   A/PROM A/PROM (deg) 08/03/2021  Flexion 75%  Extension 75%  Right lateral flexion WFL  Left lateral flexion WFL  Right rotation [facility]  Left rotation 75%   (Blank rows = not tested)   UE AROM/PROM:  WNL throughout   A/PROM Right 08/03/2021 Left 08/03/2021  Shoulder flexion      Shoulder extension      Shoulder abduction      Shoulder adduction      Shoulder extension      Shoulder internal rotation      Shoulder external rotation      Elbow flexion      Elbow extension      Wrist flexion  Wrist extension      Wrist ulnar deviation      Wrist radial deviation      Wrist pronation      Wrist supination          UE MMT:   MMT Right 08/03/2021 Left 08/03/2021  Shoulder flexion 5 5  Shoulder extension      Shoulder abduction 5 5  Shoulder adduction      Shoulder extension      Shoulder internal rotation 5 5  Shoulder external rotation 5 5  Middle trapezius      Lower trapezius      Elbow flexion 5 5  Elbow extension 5 5  Wrist flexion      Wrist extension      Wrist ulnar deviation      Wrist radial deviation      Wrist pronation      Wrist supination      Grip strength 31 25   (Blank rows = not tested)   CERVICAL SPECIAL TESTS:  Upper limb tension test (ULTT): Positive     FUNCTIONAL TESTS:  Not  applicable        TODAY'S TREATMENT:  [facility] Adult PT Treatment:                                                DATE: 08/30/21 Therapeutic Exercise: UBE 3/3 L2 Supine L shoulder flexion 15x 1# Supine L shoulder press and protraction 15x 1# SL ER L 15x 1# SL L abduction 15x 1# Prone ext 15x 1# Prone flexion 15x 1# Prone hor abd IR/ER 10/10 1# Prone scaption 15x 1# Manual resistance to 4-way shoulder 15x each direction  [facility] Adult PT Treatment:                                                DATE: 08/23/21 Therapeutic Exercise: UBE 3/3 L1 Supine L shoulder flexion 15x Supine L shoulder press and protraction 15x SL ER L 15x SL L abduction 15x Prone ext 15x Prone flexion 15x Prone hor abd IR/ER 10/10 Prone scaption Manual Thaerapy: L scalene stretch 30s x3 L first rib MET   [facility] Adult PT Treatment:                                                DATE: 08/18/21 Therapeutic Exercise: UBE 3/3 L1 Shrugs 15x Scapular retractions 5x Scapular depression from table 15x Latissimus press with breathing patterns emphasized 10x L UT stretch 30sx3 Grip strength 30# R, 40# L TPDN to scalene and UT on L performed by Carlus Pavlov PT Trigger Point Dry Needling Treatment: Pre-treatment instruction: Patient instructed on dry needling rationale, procedures, and possible side effects including pain during treatment (achy,cramping feeling), bruising, drop of blood, lightheadedness, nausea, sweating. Patient Consent Given: Yes Education handout provided: No Muscles treated: L scalenes and upper trap  Electrical stimulation performed: No Parameters: N/A Treatment response/outcome: Twitch response elicited Post-treatment instructions: Patient instructed to expect possible mild to moderate muscle soreness later today and/or tomorrow. Patient instructed in methods to reduce muscle soreness and to continue  prescribed HEP. If patient was dry needled over the lung field, patient was instructed on signs and  symptoms of pneumothorax and, however unlikely, to see immediate medical attention should they occur. Patient was also educated on signs and symptoms of infection and to seek medical attention should they occur. Patient verbalized understanding of these instructions and education.       PATIENT EDUCATION:  Education details: Discussed eval findings, rehab rationale and POC and patient is in agreement  Person educated: Patient Education method: Explanation, Demonstration, and Handouts Education comprehension: verbalized understanding, returned demonstration, tactile cues required, and needs further education     HOME EXERCISE PROGRAM: Access Code: [email or web address] URL: [email or web address] Date: 08/30/2021 Prepared by: Sharlynn Oliphant  Exercises Seated Upper Trapezius Stretch - 2 x daily - 7 x weekly - 2 sets - 10 reps Supine Shoulder Horizontal Abduction with Resistance - 2 x daily - 7 x weekly - 2 sets - 15 reps - 3s hold Seated Shoulder Press Ups Off Table - 2 x daily - 7 x weekly - 2 sets - 15 reps - 3s hold     ASSESSMENT:   CLINICAL IMPRESSION: No distinct overall improvement as symptom intensity but does report some intermittent symptom relief, unable to determine aggravating or relieving activities but symptoms related to stress and acid reflux which occur simultaneously with symptoms.  Posterior shoulder giedle weakness markedly evident with addition of 1# weight  OBJECTIVE IMPAIRMENTS decreased knowledge of condition, decreased mobility, decreased ROM, decreased strength, impaired UE functional use, postural dysfunction, and pain.    ACTIVITY LIMITATIONS cleaning, occupation, and LUE use .    PERSONAL FACTORS Behavior pattern, Profession, and Time since onset of injury/illness/exacerbation are also affecting patient's functional outcome.      REHAB POTENTIAL: Good   CLINICAL DECISION MAKING: Stable/uncomplicated   EVALUATION COMPLEXITY: Moderate      GOALS: Goals reviewed with patient? Yes   SHORT TERM GOALS:   STG Name Target Date Goal status  1 Patient to demonstrate independence in HEP  Baseline: Self first rib w/tennis ball into L shoulder flexion in sit/supine 08/25/2021 Met  2 Expand HEP Baseline: TBD; 08/23/21 [email or web address] 08/25/2021 Met    LONG TERM GOALS:    LTG Name Target Date Goal status  1 Increase FOTO score to 51 Baseline: 23 09/14/2021 INITIAL  2 Decrease pain to 2/10 Baseline: 10/10 09/14/2021 INITIAL  3 Increase L grip strength to 30# Baseline: 25# 09/15/2021 INITIAL  4 Increase L cervical rotation to 90% Baseline: 75% 09/15/2021 INITIAL  PLAN: PT FREQUENCY: 1x/week   PT DURATION: 6 weeks   PLANNED INTERVENTIONS: Therapeutic exercises, Therapeutic activity, Neuro Muscular re-education, Balance training, Gait training, Patient/Family education, Joint mobilization, Dry Needling, and Manual therapy   PLAN FOR NEXT SESSION: Assess LTGs and possible DC or continue with shoulder strengthening program based on symptoms.      Lanice Shirts, PT 08/30/2021, 4:23 PM

## 2021-08-31 ENCOUNTER — Ambulatory Visit: Payer: 59 | Attending: Family Medicine | Admitting: Clinical

## 2021-08-31 ENCOUNTER — Telehealth (HOSPITAL_COMMUNITY): Payer: Self-pay | Admitting: Emergency Medicine

## 2021-08-31 ENCOUNTER — Other Ambulatory Visit: Payer: Self-pay

## 2021-08-31 DIAGNOSIS — F331 Major depressive disorder, recurrent, moderate: Secondary | ICD-10-CM | POA: Diagnosis not present

## 2021-08-31 DIAGNOSIS — F410 Panic disorder [episodic paroxysmal anxiety] without agoraphobia: Secondary | ICD-10-CM

## 2021-08-31 LAB — CERVICOVAGINAL ANCILLARY ONLY
Bacterial Vaginitis (gardnerella): POSITIVE — AB
Candida Glabrata: POSITIVE — AB
Candida Vaginitis: POSITIVE — AB
Chlamydia: NEGATIVE
Comment: NEGATIVE
Comment: NEGATIVE
Comment: NEGATIVE
Comment: NEGATIVE
Comment: NEGATIVE
Comment: NORMAL
Neisseria Gonorrhea: NEGATIVE
Trichomonas: NEGATIVE

## 2021-08-31 MED ORDER — FLUCONAZOLE 150 MG PO TABS: 150.0000 mg | ORAL_TABLET | Freq: Once | ORAL | 0 refills | Status: AC

## 2021-08-31 MED ORDER — METRONIDAZOLE 500 MG PO TABS: 500.0000 mg | ORAL_TABLET | Freq: Two times a day (BID) | ORAL | 0 refills | Status: DC

## 2021-09-01 ENCOUNTER — Encounter (HOSPITAL_BASED_OUTPATIENT_CLINIC_OR_DEPARTMENT_OTHER): Payer: Self-pay

## 2021-09-02 ENCOUNTER — Encounter: Payer: 59 | Attending: Internal Medicine | Admitting: Registered"

## 2021-09-02 ENCOUNTER — Other Ambulatory Visit: Payer: Self-pay

## 2021-09-02 DIAGNOSIS — R634 Abnormal weight loss: Secondary | ICD-10-CM | POA: Insufficient documentation

## 2021-09-02 DIAGNOSIS — R1310 Dysphagia, unspecified: Secondary | ICD-10-CM | POA: Insufficient documentation

## 2021-09-02 NOTE — BH Specialist Note (Signed)
Integrated Behavioral Health Follow Up In-Person Visit  MRN: [id number] Name: Laurie Bell  Number of [place] Clinician visits: 2- Second Visit  Session Start time: 1007   Session End time: 1219  Total time in minutes: 30   Types of Service: Individual psychotherapy  Interpretor:No. Interpretor Name and Language: N/A  Subjective: [name] is a 41 y.o. female accompanied by  self Patient was referred by Charlott Rakes, MD for anxiety. Patient reports the following symptoms/concerns: Reports feeling anxious during session. Reports that she feels like she is having a panic attack during the session. Reports that she started taking melatonin to assist with sleep but that she wakes up after 5-10 min of being asleep. Reports that she continues to wory about her physical health.  Duration of problem: 6 months; Severity of problem: severe  Objective: Mood: Anxious and Affect: Appropriate Risk of harm to self or others: No plan to harm self or others  Life Context: Family and Social: Pt receives support from fiance. Pt has two children who are adults. Pt's son lives with her. School/Work: Pt has two jobs.  Self-Care: Denies substance use. Pt currently prescribed hydroxyzine and fluoxetine. Pt has been taking melatonin to assist with sleep. Life Changes: Pt has experienced physical health changes which has increased pt's anxiety.    Patient and/or Family's Strengths/Protective Factors: Concrete supports in place (healthy food, safe environments, etc.)  Goals Addressed: Patient will:  Reduce symptoms of: anxiety and depression   Increase knowledge and/or ability of: coping skills   Demonstrate ability to: Increase healthy adjustment to current life circumstances  Progress towards Goals: Ongoing  Interventions: Interventions utilized:  Mindfulness or Psychologist, educational, CBT Cognitive Behavioral Therapy, and Supportive Counseling Standardized  Assessments completed: Not Needed  Patient and/or Family Response: Pt receptive to utilizing mindfulness techniques. Pt receptive to cognitive restructuring.   Patient Centered Plan: Patient is on the following Treatment Plan(s): Anxiety and depression  Assessment: Denies SI/HI. Patient currently experiencing severe anxiety. Pt was anxious during session. Pt appears to frequently worry that she is going to have a panic attack. Pt was worried that she was about to have a panic attack during session. Pt has difficulty with assuming the worst possible outcome.   Patient may benefit from mindfulness. LCSW completed mindfulness with pt during session. LCSW utilized Adult nurse. LCSW encouraged pt to utilize deep breathing and mindfulness strategies. LCSW encouraged pt to continue adhering to medication.  Plan: Follow up with behavioral health clinician on : 09/21/21 Behavioral recommendations: Establish with [facility] for psychiatry/outpatient therapy. Utilize relaxation techniques via deep breathing and mindfulness. Continue adhering to medication. Referral(s): [place] (In Clinic) and [place] (LME/Outside Clinic) "From scale of 1-10, how likely are you to follow plan?": 10  Audra Bellard C Jamya Starry, LCSW

## 2021-09-05 ENCOUNTER — Other Ambulatory Visit: Payer: Self-pay

## 2021-09-06 ENCOUNTER — Other Ambulatory Visit: Payer: Self-pay

## 2021-09-06 ENCOUNTER — Ambulatory Visit: Payer: 59

## 2021-09-06 DIAGNOSIS — M542 Cervicalgia: Secondary | ICD-10-CM | POA: Diagnosis not present

## 2021-09-06 DIAGNOSIS — G540 Brachial plexus disorders: Secondary | ICD-10-CM

## 2021-09-06 NOTE — Therapy (Signed)
OUTPATIENT PHYSICAL THERAPY TREATMENT NOTE/DC SUMMARY   Patient Name: Laurie Bell MRN: [id number] DOB:01-11-1981, 41 y.o., female Today's Date: 09/06/2021  PCP: Charlott Rakes, MD REFERRING PROVIDER: Charlott Rakes, MD PHYSICAL THERAPY DISCHARGE SUMMARY  Visits from Start of Care: 6  Current functional level related to goals / functional outcomes: Goals partially met   Remaining deficits: Pain and grip strength weakness   Education / Equipment: HEP   Patient agrees to discharge. Patient goals were partially met. Patient is being discharged due to did not respond to therapy.    PT End of Session - 09/06/21 1612     Visit Number 6    Number of Visits 6    Date for PT Re-Evaluation 09/14/21    Authorization Type [employer]    Progress Note Due on Visit 6    PT Start Time 1615    PT Stop Time 1655    PT Time Calculation (min) 40 min    Activity Tolerance Patient tolerated treatment well    Behavior During Therapy [facility] for tasks assessed/performed             Past Medical History:  Diagnosis Date   Anxiety    Arthritis    Chlamydia contact, treated    GERD (gastroesophageal reflux disease)    Past Surgical History:  Procedure Laterality Date   ENDOMETRIAL ABLATION N/A 04/25/2017   Procedure: ENDOMETRIAL ABLATION With NOVASURE;  Surgeon: Woodroe Mode, MD;  Location: [place];  Service: Gynecology;  Laterality: N/A;   ESOPHAGEAL MANOMETRY N/A 07/27/2021   Procedure: ESOPHAGEAL MANOMETRY (EM);  Surgeon: Sharyn Creamer, MD;  Location: [facility];  Service: Gastroenterology;  Laterality: N/A;   TUBAL LIGATION     Patient Active Problem List   Diagnosis Date Noted   Irregular heart rhythm 04/15/2021   Numbness on left side 04/15/2021   Dysphagia 03/17/2021   Hypomagnesemia 02/23/2021   Gastroesophageal reflux disease    Hypokalemia 08/24/2020   Atypical chest pain 08/24/2020   Nausea and vomiting 08/24/2020   Hypocalcemia  08/24/2020   DUB (dysfunctional uterine bleeding) 01/12/2017   Fibroid, uterine 01/12/2017    REFERRING DIAG: Cervicalgia  Thoracic outlet syndrome  THERAPY DIAG:  Cervicalgia  Thoracic outlet syndrome  PERTINENT HISTORY: unremarkable  PRECAUTIONS: none  SUBJECTIVE: Overall no changes to report, continued pain and numbness into L hand and was unable to tie her shoes the other day due to loss of strength.  PAIN:  Are you having pain? Yes NPRS scale: 7/10 Pain location: L arm Pain orientation: Left  PAIN TYPE: aching Pain description: burning  Aggravating factors: activity Relieving factors: rest      OBJECTIVE:    DIAGNOSTIC FINDINGS:  Unremarkable brain MRI   PATIENT SURVEYS:  FOTO 23     COGNITION: Overall cognitive status: Within functional limits for tasks assessed            SENSATION: Light touch: Appears intact   POSTURE:  Elevated L first rib   PALPATION: Tenderness to first rib and scalene group L         CERVICAL AROM/PROM   A/PROM A/PROM (deg) 08/03/2021  Flexion 75%  Extension 75%  Right lateral flexion WFL  Left lateral flexion WFL  Right rotation [facility]  Left rotation 75%   (Blank rows = not tested)   UE AROM/PROM:  WNL throughout   A/PROM Right 08/03/2021 Left 08/03/2021  Shoulder flexion      Shoulder extension  Shoulder abduction      Shoulder adduction      Shoulder extension      Shoulder internal rotation      Shoulder external rotation      Elbow flexion      Elbow extension      Wrist flexion      Wrist extension      Wrist ulnar deviation      Wrist radial deviation      Wrist pronation      Wrist supination          UE MMT:   MMT Right 08/03/2021 Left 08/03/2021  Shoulder flexion 5 5  Shoulder extension      Shoulder abduction 5 5  Shoulder adduction      Shoulder extension      Shoulder internal rotation 5 5  Shoulder external rotation 5 5  Middle trapezius      Lower trapezius      Elbow flexion  5 5  Elbow extension 5 5  Wrist flexion      Wrist extension      Wrist ulnar deviation      Wrist radial deviation      Wrist pronation      Wrist supination      Grip strength 31/50(3/28) 25/30(3/28)   (Blank rows = not tested)   CERVICAL SPECIAL TESTS:  Upper limb tension test (ULTT): Positive     FUNCTIONAL TESTS:  Not applicable        TODAY'S TREATMENT:  [facility] Adult PT Treatment:                                                DATE: 09/06/21 Therapeutic Exercise: UBE 3/3 L2 Supine hor abd RTB 15x Prone shoulder ext 15x B Prone Ws 15x B Prone retractions hands behind head 15x  [facility] Adult PT Treatment:                                                DATE: 08/30/21 Therapeutic Exercise: UBE 3/3 L2 Supine L shoulder flexion 15x 1# Supine L shoulder press and protraction 15x 1# SL ER L 15x 1# SL L abduction 15x 1# Prone ext 15x 1# Prone flexion 15x 1# Prone hor abd IR/ER 10/10 1# Prone scaption 15x 1# Manual resistance to 4-way shoulder 15x each direction  [facility] Adult PT Treatment:                                                DATE: 08/23/21 Therapeutic Exercise: UBE 3/3 L1 Supine L shoulder flexion 15x Supine L shoulder press and protraction 15x SL ER L 15x SL L abduction 15x Prone ext 15x Prone flexion 15x Prone hor abd IR/ER 10/10 Prone scaption Manual Thaerapy: L scalene stretch 30s x3 L first rib MET       PATIENT EDUCATION:  Education details: Discussed eval findings, rehab rationale and POC and patient is in agreement  Person educated: Patient Education method: Explanation, Demonstration, and Handouts Education comprehension: verbalized understanding, returned demonstration, tactile cues required, and needs further education  HOME EXERCISE PROGRAM: Access Code: [email or web address] URL: [email or web address] Date: 09/06/2021 Prepared by: Sharlynn Oliphant  Exercises - Supine Shoulder Horizontal Abduction with Resistance  - 2 x daily - 7 x  weekly - 2 sets - 15 reps - 3s hold - Prone Scapular Slide with Shoulder Extension  - 2 x daily - 7 x weekly - 2 sets - 15 reps - 3s hold - Prone W Scapular Retraction  - 2 x daily - 7 x weekly - 2 sets - 15 reps - 3s hold - Prone Scapular Retraction with Hands Behind Head  - 2 x daily - 7 x weekly - 2 sets - 15 reps - 3s hold     ASSESSMENT:   CLINICAL IMPRESSION: Continued LUE pain, paresthesias and grip weakness despite improved cervical mobility.  Continues to demo S&S of TOS.  Stretching, strengthening and DN have not effected symptoms significantly.  OBJECTIVE IMPAIRMENTS decreased knowledge of condition, decreased mobility, decreased ROM, decreased strength, impaired UE functional use, postural dysfunction, and pain.    ACTIVITY LIMITATIONS cleaning, occupation, and LUE use .    PERSONAL FACTORS Behavior pattern, Profession, and Time since onset of injury/illness/exacerbation are also affecting patient's functional outcome.      REHAB POTENTIAL: Good   CLINICAL DECISION MAKING: Stable/uncomplicated   EVALUATION COMPLEXITY: Moderate     GOALS: Goals reviewed with patient? Yes   SHORT TERM GOALS:   STG Name Target Date Goal status  1 Patient to demonstrate independence in HEP  Baseline: Self first rib w/tennis ball into L shoulder flexion in sit/supine 08/25/2021 Met  2 Expand HEP Baseline: TBD; 08/23/21 [email or web address] 08/25/2021 Met    LONG TERM GOALS:    LTG Name Target Date Goal status  1 Increase FOTO score to 51 Baseline: 23; 09/06/21 27 09/14/2021 Not met  2 Decrease pain to 2/10 Baseline: 10/10; 09/06/21 8/10 09/14/2021 Not met  3 Increase L grip strength to 30# Baseline: 25#; 09/06/21 30# 09/15/2021 Met  4 Increase L cervical rotation to 90% Baseline: 75%; 09/06/21 90% 09/15/2021 Met  PLAN: PT FREQUENCY: 1x/week   PT DURATION: 6 weeks   PLANNED INTERVENTIONS: Therapeutic exercises, Therapeutic activity, Neuro Muscular re-education, Balance training, Gait training,  Patient/Family education, Joint mobilization, Dry Needling, and Manual therapy   PLAN FOR NEXT SESSION: Assess LTGs and possible DC or continue with shoulder strengthening program based on symptoms.      Lanice Shirts, PT 09/06/2021, 4:13 PM

## 2021-09-07 ENCOUNTER — Other Ambulatory Visit: Payer: Self-pay | Admitting: Family Medicine

## 2021-09-07 ENCOUNTER — Other Ambulatory Visit: Payer: Self-pay

## 2021-09-07 ENCOUNTER — Telehealth (HOSPITAL_COMMUNITY): Payer: Self-pay | Admitting: Emergency Medicine

## 2021-09-07 MED ORDER — METRONIDAZOLE 0.75 % VA GEL: 1.0000 | Freq: Every day | VAGINAL | 0 refills | Status: DC

## 2021-09-07 MED ORDER — TIZANIDINE HCL 2 MG PO TABS
2.0000 mg | ORAL_TABLET | Freq: Four times a day (QID) | ORAL | 0 refills | Status: DC
Start: 2021-09-07 — End: 2021-09-26
  Filled 2021-09-07: qty 60, 15d supply, fill #0

## 2021-09-07 MED ORDER — TERCONAZOLE 0.8 % VA CREA: 1.0000 | TOPICAL_CREAM | Freq: Every day | VAGINAL | 0 refills | Status: DC

## 2021-09-07 NOTE — Telephone Encounter (Signed)
Pt is requesting vaginal cream instead of pill. (Metronidazole). Please advise. Thanks.

## 2021-09-07 NOTE — Telephone Encounter (Signed)
Sent to pharmacy:  Meds ordered this encounter  Medications   terconazole (TERAZOL 3) 0.8 % vaginal cream    Sig: Place 1 applicator vaginally at bedtime.    Dispense:  20 g    Refill:  0   metroNIDAZOLE (METROGEL) 0.75 % vaginal gel    Sig: Place 1 Applicatorful vaginally at bedtime.    Dispense:  70 g    Refill:  0

## 2021-09-08 ENCOUNTER — Other Ambulatory Visit: Payer: Self-pay | Admitting: Family Medicine

## 2021-09-08 ENCOUNTER — Ambulatory Visit (HOSPITAL_BASED_OUTPATIENT_CLINIC_OR_DEPARTMENT_OTHER): Payer: 59 | Admitting: Medical

## 2021-09-08 DIAGNOSIS — M5412 Radiculopathy, cervical region: Secondary | ICD-10-CM

## 2021-09-09 ENCOUNTER — Encounter: Payer: 59 | Attending: Physical Medicine & Rehabilitation | Admitting: Physical Medicine & Rehabilitation

## 2021-09-09 ENCOUNTER — Encounter: Payer: Self-pay | Admitting: Physical Medicine & Rehabilitation

## 2021-09-09 ENCOUNTER — Other Ambulatory Visit: Payer: Self-pay

## 2021-09-09 VITALS — BP 114/80 | HR 87 | Ht 67.0 in | Wt 181.0 lb

## 2021-09-09 DIAGNOSIS — M542 Cervicalgia: Secondary | ICD-10-CM | POA: Diagnosis present

## 2021-09-09 DIAGNOSIS — R0789 Other chest pain: Secondary | ICD-10-CM | POA: Insufficient documentation

## 2021-09-09 MED ORDER — GABAPENTIN 400 MG PO CAPS: 400.0000 mg | ORAL_CAPSULE | Freq: Four times a day (QID) | ORAL | 1 refills | Status: DC
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
## 2021-09-09 NOTE — Patient Instructions (Signed)
Neck Exercises Ask your health care provider which exercises are safe for you. Do exercises exactly as told by your health care provider and adjust them as directed. It is normal to feel mild stretching, pulling, tightness, or discomfort as you do these exercises. Stop right away if you feel sudden pain or your pain gets worse. Do not begin these exercises until told by your health care provider. Neck exercises can be important for many reasons. They can improve strength and maintain flexibility in your neck, which will help your upper back and prevent neck pain. Stretching exercises Rotation neck stretching  Sit in a chair or stand up. Place your feet flat on the floor, shoulder-width apart. Slowly turn your head (rotate) to the right until a slight stretch is felt. Turn it all the way to the right so you can look over your right shoulder. Do not tilt or tip your head. Hold this position for 10-30 seconds. Slowly turn your head (rotate) to the left until a slight stretch is felt. Turn it all the way to the left so you can look over your left shoulder. Do not tilt or tip your head. Hold this position for 10-30 seconds. Repeat __________ times. Complete this exercise __________ times a day. Neck retraction  Sit in a sturdy chair or stand up. Look straight ahead. Do not bend your neck. Use your fingers to push your chin backward (retraction). Do not bend your neck for this movement. Continue to face straight ahead. If you are doing the exercise properly, you will feel a slight sensation in your throat and a stretch at the back of your neck. Hold the stretch for 1-2 seconds. Repeat __________ times. Complete this exercise __________ times a day. Strengthening exercises Neck press  Lie on your back on a firm bed or on the floor with a pillow under your head. Use your neck muscles to push your head down on the pillow and straighten your spine. Hold the position as well as you can. Keep your head  facing up (in a neutral position) and your chin tucked. Slowly count to 5 while holding this position. Repeat __________ times. Complete this exercise __________ times a day. Isometrics These are exercises in which you strengthen the muscles in your neck while keeping your neck still (isometrics). Sit in a supportive chair and place your hand on your forehead. Keep your head and face facing straight ahead. Do not flex or extend your neck while doing isometrics. Push forward with your head and neck while pushing back with your hand. Hold for 10 seconds. Do the sequence again, this time putting your hand against the back of your head. Use your head and neck to push backward against the hand pressure. Finally, do the same exercise on either side of your head, pushing sideways against the pressure of your hand. Repeat __________ times. Complete this exercise __________ times a day. Prone head lifts  Lie face-down (prone position), resting on your elbows so that your chest and upper back are raised. Start with your head facing downward, near your chest. Position your chin either on or near your chest. Slowly lift your head upward. Lift until you are looking straight ahead. Then continue lifting your head as far back as you can comfortably stretch. Hold your head up for 5 seconds. Then slowly lower it to your starting position. Repeat __________ times. Complete this exercise __________ times a day. Supine head lifts  Lie on your back (supine position), bending your knees   to point to the ceiling and keeping your feet flat on the floor. Lift your head slowly off the floor, raising your chin toward your chest. Hold for 5 seconds. Repeat __________ times. Complete this exercise __________ times a day. Scapular retraction  Stand with your arms at your sides. Look straight ahead. Slowly pull both shoulders (scapulae) backward and downward (retraction) until you feel a stretch between your shoulder  blades in your upper back. Hold for 10-30 seconds. Relax and repeat. Repeat __________ times. Complete this exercise __________ times a day. Contact a health care provider if: Your neck pain or discomfort gets worse when you do an exercise. Your neck pain or discomfort does not improve within 2 hours after you exercise. If you have any of these problems, stop exercising right away. Do not do the exercises again unless your health care provider says that you can. Get help right away if: You develop sudden, severe neck pain. If this happens, stop exercising right away. Do not do the exercises again unless your health care provider says that you can. This information is not intended to replace advice given to you by your health care provider. Make sure you discuss any questions you have with your health care provider. Document Revised: 11/23/2020 Document Reviewed: 11/23/2020 Elsevier Patient Education  2022 Elsevier Inc.  

## 2021-09-15 ENCOUNTER — Ambulatory Visit: Payer: 59 | Admitting: Internal Medicine

## 2021-09-15 ENCOUNTER — Encounter: Payer: Self-pay | Admitting: Internal Medicine

## 2021-09-15 VITALS — BP 100/70 | HR 80 | Ht 67.0 in | Wt 184.0 lb

## 2021-09-15 DIAGNOSIS — R109 Unspecified abdominal pain: Secondary | ICD-10-CM | POA: Diagnosis not present

## 2021-09-15 DIAGNOSIS — R0789 Other chest pain: Secondary | ICD-10-CM | POA: Diagnosis not present

## 2021-09-15 DIAGNOSIS — K219 Gastro-esophageal reflux disease without esophagitis: Secondary | ICD-10-CM

## 2021-09-15 MED ORDER — AMBULATORY NON FORMULARY MEDICATION: 0 refills | Status: DC

## 2021-09-15 MED ORDER — BACLOFEN 10 MG PO TABS: 10.0000 mg | ORAL_TABLET | Freq: Two times a day (BID) | ORAL | 2 refills | Status: DC

## 2021-09-15 NOTE — Patient Instructions (Addendum)
If you are age 42 or older, your body mass index should be between 23-30. Your Body mass index is 28.82 kg/m. If this is out of the aforementioned range listed, please consider follow up with your Primary Care Provider.  If you are age 31 or younger, your body mass index should be between 19-25. Your Body mass index is 28.82 kg/m. If this is out of the aformentioned range listed, please consider follow up with your Primary Care Provider.   We have sent the following medications to your pharmacy for you to pick up at your convenience: GI Cocktail as needed. This prescription will be sent to [facility].  You have been scheduled for a CT scan of the abdomen and pelvis at [facility], 1st floor Radiology. You are scheduled on 09/23/21  at [place] should arrive 15 minutes prior to your appointment time for registration.  Please pick up 2 bottles of contrast from [facility] at least 3 days prior to your scan. The solution may taste better if refrigerated, but do NOT add ice or any other liquid to this solution. Shake well before drinking.   Please follow the written instructions below on the day of your exam:   1) Do not eat anything after 12pm (4 hours prior to your test)   2) Drink 1 bottle of contrast @ 2pm (2 hours prior to your exam)  Remember to shake well before drinking and do NOT pour over ice.     Drink 1 bottle of contrast @ 3pm (1 hour prior to your exam)   You may take any medications as prescribed with a small amount of water, if necessary. If you take any of the following medications: METFORMIN, GLUCOPHAGE, GLUCOVANCE, AVANDAMET, RIOMET, FORTAMET, [place] MET, JANUMET, GLUMETZA or METAGLIP, you MAY be asked to HOLD this medication 48 hours AFTER the exam.   The purpose of you drinking the oral contrast is to aid in the visualization of your intestinal tract. The contrast solution may cause some diarrhea. Depending on your individual set of symptoms, you may also  receive an intravenous injection of x-ray contrast/dye. Plan on being at [facility] for 45 minutes or longer, depending on the type of exam you are having performed.   If you have any questions regarding your exam or if you need to reschedule, you may call [facility] at [phone] between the hours of 8:00 am and 5:00 pm, Monday-Friday.    The [facility] providers would like to encourage you to use [facility] to communicate with providers for non-urgent requests or questions.  Due to long hold times on the telephone, sending your provider a message by [facility] may be a faster and more efficient way to get a response.  Please allow 48 business hours for a response.  Please remember that this is for non-urgent requests.   It was a pleasure to see you today!  Thank you for trusting me with your gastrointestinal care!    Christia Reading, MD

## 2021-09-15 NOTE — Progress Notes (Signed)
Chief Complaint: GERD  HPI : 41 year old female with history of GERD, anxiety, and athritis presents for follow up of GERD  Interval History: Patient states that she feels like baclofen has been helping with her abdominal discomfort.  The baclofen seems to be helping her have bowel movements more frequently as well.  She did not start the MiraLAX or fiber supplement since her last clinic visit.  She is still issues with sensations of acid building up.  She has still been compliant with her pantoprazole therapy.  If she does not eat, she does notice that the seems to get worse.  She is still using Zofran as needed to help with her nausea.  Wt Readings from Last 3 Encounters:  09/15/21 184 lb (83.5 kg)  09/09/21 181 lb (82.1 kg)  08/18/21 178 lb (80.7 kg)    Current Outpatient Medications  Medication Sig Dispense Refill   diclofenac Sodium (VOLTAREN) 1 % GEL Apply 2 g topically 4 (four) times daily. 100 g 0   FLUoxetine (PROZAC) 40 MG capsule Take 1 capsule (40 mg total) by mouth daily. 30 capsule 3   gabapentin (NEURONTIN) 400 MG capsule Take 1 capsule (400 mg total) by mouth 4 (four) times daily. 120 capsule 1   hydrOXYzine (ATARAX) 25 MG tablet Take 1 tablet (25 mg total) by mouth 3 (three) times daily. 60 tablet 1   lidocaine (LIDODERM) 5 % Place 1 patch onto the skin daily. 30 patch 3   megestrol (MEGACE) 40 MG tablet Take 1 tablet (40 mg total) by mouth daily. 30 tablet 0   metroNIDAZOLE (METROGEL) 0.75 % vaginal gel Place 1 Applicatorful vaginally at bedtime. 70 g 0   ondansetron (ZOFRAN-ODT) 4 MG disintegrating tablet Take 1 tablet (4 mg total) by mouth every 8 (eight) hours as needed for nausea or vomiting. 20 tablet 0   pantoprazole (PROTONIX) 40 MG tablet Take 1 tablet (40 mg total) by mouth 2 (two) times daily before a meal. 120 tablet 1   terconazole (TERAZOL 3) 0.8 % vaginal cream Place 1 applicator vaginally at bedtime. 20 g 0   tiZANidine (ZANAFLEX) 2 MG tablet Take 1 tablet  (2 mg total) by mouth every 6 (six) hours. 60 tablet 0   hyoscyamine (LEVSIN SL) 0.125 MG SL tablet Place 1 tablet (0.125 mg total) under the tongue every 6 (six) hours as needed. (Patient not taking: Reported on 09/15/2021) 40 tablet 0   No current facility-administered medications for this visit.   Review of Systems: All systems reviewed and negative except where noted in HPI.   Physical Exam: BP 100/70 (BP Location: Left Arm, Patient Position: Sitting, Cuff Size: Normal)   Pulse 80   Ht '5\' 7"'$  (1.702 m)   Wt 184 lb (83.5 kg)   LMP 08/28/2021   BMI 28.82 kg/m  Constitutional: Pleasant,well-developed, female in no acute distress. HEENT: Normocephalic and atraumatic. Conjunctivae are normal. No scleral icterus. Cardiovascular: Normal rate, regular rhythm.  Pulmonary/chest: Effort normal and breath sounds normal. No wheezing, rales or rhonchi. Abdominal: Soft, nondistended, nontender. Bowel sounds active throughout. There are no masses palpable. No hepatomegaly. Extremities: No edema Neurological: Alert and oriented to person place and time. Skin: Skin is warm and dry. No rashes noted. Psychiatric: Normal mood and affect. Behavior is normal.  Labs 05/2021: Lipase nml. LFTs nml. CBC and BMP nml. TSH mildly low. FT4 and FT3 nml.  Labs 06/2021: Vit D nml, Vit B12 nml. HCV Ab negative.  CT renal stone study  12/20/16: IMPRESSION: 1. No acute intra-abdominal or pelvic pathology. Specifically there is no hydronephrosis or nephrolithiasis. 2. Moderate colonic stool burden. No bowel obstruction or active inflammation. Normal appendix. 3. Prominent uterus with poorly visualized fibroid.  Barium swallow 08/26/20: IMPRESSION: Moderate gastroesophageal reflux Negative for peptic ulcer disease. 3 mm calcification to the left of L2-3 disc space is new since yesterday. Question symptoms of left renal colic.  Speech therapy evaluation 05/27/21: Pt presents with a functional dysphagia that has a  significant negative impact on her life - she avoids eating in restaurants; she limits her diet to a narrow selection of foods; she has lost weight.  During today's study, Ms. Petrides self-limited bolus sizes (solid foods ~1/2 tspn; liquids ~ 5 ml) and required encouragement to consume >10 ml sips of liquid or tablespoon sized solid boluses.  She verbalized her concerns about increasing bolus size and became tearful and apologetic and discussed her fears related to swallowing.  Offered encouragement.  Imaging revealed normal biomechanics of the swallow with thorough mastication, timely swallow response, normal pharyngeal squeeze, reliable laryngeal vestibule closure, no penetration/aspiration, no obvious stasis in esophagus upon brief screen.  Ms. Jalloh identifed globus sensation during the study. We watched the study together in real time; we discussed the anatomical separation of the airway from the esophagus and discussed that the feeling of pressure when eating is related to her esophagus, but that her breathing is not compromised.  She was encouraged to record a few frames of the video as affirmation of the normal mechanics of her swallowing.  We discussed recommendations, and Ms. Ashton agreed she might benefit from OP SLP to address the functional nature of her dysphagia. Recommend referral to [facility] neuro OP clinic to address the aforementioned condition.  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 - Non-bleeding gastric ulcers with a clean ulcer base (Forrest Class III). Biopsied. - Normal examined duodenum. - Biopsies were taken with a cold forceps for histology in the proximal esophagus and in the distal esophagus. Path: 1. Surgical [P], gastric antrum and gastric body - GASTRIC ANTRAL MUCOSA WITH MILD NONSPECIFIC REACTIVE GASTROPATHY - GASTRIC OXYNTIC MUCOSA WITH NO SPECIFIC HISTOPATHOLOGIC CHANGES - WARTHIN STARRY STAIN IS NEGATIVE FOR HELICOBACTER PYLORI 2. Surgical [P], distal  esophagus - ESOPHAGEAL SQUAMOUS AND CARDIAC MUCOSA WITH REFLUX-ASSOCIATED CHANGES - NEGATIVE FOR INTESTINAL METAPLASIA OR DYSPLASIA 3. 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 - Normal stomach. - Normal examined duodenum. - No specimens collected.  Esophageal manometry 07/27/21: Normal.  ASSESSMENT AND PLAN: GERD Dysphagia Abdominal pain Zenker's diverticulum Patient has had some improvement in her abdominal discomfort thus we will go ahead and increase baclofen from 5 mg twice daily to 10 mg twice daily see if this will help further with her symptoms.  We will see if her symptoms GI cocktail to use as needed will also help with symptom control.  We will go ahead and get updated CT to make sure that she does not have any alternative sources of her abdominal pain since her last abdominal imaging was performed in 2018. - GERD handout - Continue pantoprazole 40 mg BID - Increase baclofen from 5 mg BID to 10 mg BID - Cont Zofran - GI cocktail PRN - CT A/P w/contrast - RTC 6 weeks. Consider colonoscopy in the future  Christia Reading, MD  I spent  42 minutes of time, including in depth chart review, independent review of results as outlined above, communicating results with the patient directly, face-to-face time with the patient, coordinating care, ordering studies and medications as appropriate, and documentation.

## 2021-09-21 ENCOUNTER — Ambulatory Visit: Payer: 59 | Attending: Family Medicine | Admitting: Clinical

## 2021-09-21 DIAGNOSIS — F410 Panic disorder [episodic paroxysmal anxiety] without agoraphobia: Secondary | ICD-10-CM | POA: Diagnosis not present

## 2021-09-21 DIAGNOSIS — F331 Major depressive disorder, recurrent, moderate: Secondary | ICD-10-CM

## 2021-09-22 NOTE — BH Specialist Note (Signed)
Integrated Behavioral Health Follow Up In-Person Visit  MRN: [id number] Name: Laurie Bell  Number of [place] Clinician visits: 3- Third Visit  Session Start time: 8841   Session End time: 6606  Total time in minutes: 30   Types of Service: Individual psychotherapy  Interpretor:No. Interpretor Name and Language: N/A  Subjective: Laurie Bell is a 41 y.o. female accompanied by  self Patient was referred by Charlott Rakes, MD for anxiety. Patient reports the following symptoms/concerns: Reports that she has seen an improvement in her anxiety and decrease in panic attacks as she started taking ashwagandha supplements. Reports that she still experiences trouble sleeping. Reports that she has not been taking her fluoxetine consistently due to it making her sleepy during the day and that she plans to start taking it at night. Reports that she also has been utilizing deep breathing and grounding techniques.  Duration of problem: 6 months; Severity of problem: severe  Objective: Mood: Anxious and Affect: Appropriate Risk of harm to self or others: No plan to harm self or others  Life Context: Family and Social: Pt receives support from fiance. Pt has two children who are adults. Pt's son lives with her. School/Work: Pt has two jobs.  Self-Care: Denies substance use. Pt currently prescribed hydroxyzine and fluoxetine. Pt has stopped taking melatonin. Reports that she started taking ashwagandha.  Life Changes: Pt has experienced physical health changes which has increased pt's anxiety.  Patient and/or Family's Strengths/Protective Factors: Concrete supports in place (healthy food, safe environments, etc.)  Goals Addressed: Patient will:  Reduce symptoms of: anxiety and depression   Increase knowledge and/or ability of: coping skills   Demonstrate ability to: Increase healthy adjustment to current life circumstances  Progress towards  Goals: Ongoing  Interventions: Interventions utilized:  Mindfulness or Psychologist, educational, CBT Cognitive Behavioral Therapy, and Supportive Counseling Standardized Assessments completed: Not Needed  Patient and/or Family Response: Pt receptive to tx. Pt receptive to psychoeducation provided on anxiety. Pt receptive to cognitive restructuring. Pt receptive to continuing grounding techniques and deep breathing exercises.   Patient Centered Plan: Patient is on the following Treatment Plan(s): Anxiety and depression  Assessment: Denies SI/HI. Patient currently experiencing anxiety with panic attacks. Pt's anxiety appears to be improving. Pt is engaging in healthy coping skills with deep breathing, grounding techniques, and adhering to hydroxyzine.   Patient may benefit from adhering to fluoxetine. LCSW provided psychoeducation on anxiety. LCSW utilized Adult nurse. LCSW encouraged pt to take fluoxetine and continue grounding techcniques. LCSW discussed with pt that she is leaving. Pt has upcoming therapy appt in May wit [facility]. LCSW encouraged pt to keep this appt.  Plan: Follow up with behavioral health clinician on : Attend appt with [facility]. Behavioral recommendations: Adhere to medication and continue grounding techniques. Referral(s): [facility] (LME/Outside Clinic) "From scale of 1-10, how likely are you to follow plan?": 10  Daneen Volcy C Yer Castello, LCSW

## 2021-09-23 ENCOUNTER — Ambulatory Visit (HOSPITAL_COMMUNITY)
Admission: RE | Admit: 2021-09-23 | Discharge: 2021-09-23 | Disposition: A | Discharge status: Home / Self Care | Payer: 59 | Source: Ambulatory Visit | Attending: Internal Medicine | Admitting: Internal Medicine

## 2021-09-23 DIAGNOSIS — R109 Unspecified abdominal pain: Secondary | ICD-10-CM | POA: Diagnosis present

## 2021-09-23 DIAGNOSIS — R0789 Other chest pain: Secondary | ICD-10-CM | POA: Insufficient documentation

## 2021-09-23 DIAGNOSIS — K219 Gastro-esophageal reflux disease without esophagitis: Secondary | ICD-10-CM | POA: Diagnosis present

## 2021-09-23 MED ORDER — IOHEXOL 300 MG/ML  SOLN
100.0000 mL | Freq: Once | INTRAMUSCULAR | Status: AC | PRN
  Administered 2021-09-23: 100 mL via INTRAVENOUS

## 2021-09-26 ENCOUNTER — Encounter: Payer: Self-pay | Admitting: Internal Medicine

## 2021-09-26 ENCOUNTER — Ambulatory Visit: Payer: 59 | Attending: Family Medicine | Admitting: Family Medicine

## 2021-09-26 ENCOUNTER — Encounter: Payer: Self-pay | Admitting: Family Medicine

## 2021-09-26 VITALS — BP 120/82 | HR 89 | Resp 16 | Wt 184.2 lb

## 2021-09-26 DIAGNOSIS — F419 Anxiety disorder, unspecified: Secondary | ICD-10-CM | POA: Diagnosis not present

## 2021-09-26 DIAGNOSIS — J302 Other seasonal allergic rhinitis: Secondary | ICD-10-CM | POA: Diagnosis not present

## 2021-09-26 DIAGNOSIS — F32A Depression, unspecified: Secondary | ICD-10-CM | POA: Diagnosis not present

## 2021-09-26 DIAGNOSIS — M5412 Radiculopathy, cervical region: Secondary | ICD-10-CM

## 2021-09-26 MED ORDER — TIZANIDINE HCL 2 MG PO TABS: 2.0000 mg | ORAL_TABLET | Freq: Four times a day (QID) | ORAL | 6 refills | Status: DC | PRN

## 2021-09-26 MED ORDER — HYDROXYZINE HCL 25 MG PO TABS: 25.0000 mg | ORAL_TABLET | Freq: Three times a day (TID) | ORAL | 3 refills | Status: DC

## 2021-09-26 MED ORDER — FLUTICASONE PROPIONATE 50 MCG/ACT NA SUSP: 2.0000 | Freq: Every day | NASAL | 6 refills | Status: DC

## 2021-09-26 NOTE — Progress Notes (Signed)
Subjective:  Patient ID: Laurie Bell, female    DOB: 30-Mar-1981  Age: 41 y.o. MRN: [id number]  CC: Back Pain (Follow up)   HPI Laurie Bell is a 41 y.o. year old female with a history of cervical radiculopathy, chronic chest wall pain, anxiety here for follow-up visit.  Interval History: She was seen by rehab medicine last month for ongoing cervical radiculopathy and is status post trigger point injection.  Gabapentin was also increased to 400 mg 4 times daily. Injection was beneficial and pain disappeared but then returned at 8/10 after the injection wore off per patient. She has also undergone sessions of physical therapy.  She sometimes undergoes massage sessions to help with her pain.  Prozac and hydroxyzine have been beneficial with regards to her anxiety and depression.  She had also undergone psychotherapy sessions with the LCSW.  Her allergies are acting up and she has a headache and post nasal drip, using Tylenol and OTC sinus medications which have been ineffective.  Past Medical History:  Diagnosis Date   Anxiety    Arthritis    Chlamydia contact, treated    GERD (gastroesophageal reflux disease)     Past Surgical History:  Procedure Laterality Date   ENDOMETRIAL ABLATION N/A 04/25/2017   Procedure: ENDOMETRIAL ABLATION With NOVASURE;  Surgeon: Woodroe Mode, MD;  Location: [facility];  Service: Gynecology;  Laterality: N/A;   ESOPHAGEAL MANOMETRY N/A 07/27/2021   Procedure: ESOPHAGEAL MANOMETRY (EM);  Surgeon: Sharyn Creamer, MD;  Location: [facility];  Service: Gastroenterology;  Laterality: N/A;   TUBAL LIGATION      Family History  Problem Relation Age of Onset   Hypertension Mother    Hypertension Maternal Aunt    Cancer Maternal Grandmother        type unknown   Esophageal cancer Neg Hx    Rectal cancer Neg Hx    Stomach cancer Neg Hx    Colon cancer Neg Hx     Social History   Socioeconomic History   Marital status:  Single    Spouse name: Not on file   Number of children: 2   Years of education: Not on file   Highest education level: Not on file  Occupational History   Occupation: Best boy: [employer]  Tobacco Use   Smoking status: Never   Smokeless tobacco: Never  Vaping Use   Vaping Use: Never used  Substance and Sexual Activity   Alcohol use: No   Drug use: Not Currently    Types: Marijuana    Comment: quit 2018   Sexual activity: Not Currently    Birth control/protection: Surgical  Other Topics Concern   Not on file  Social History Narrative   Right Handed    Lives in a one story home   Social Determinants of Health   Financial Resource Strain: Not on file  Food Insecurity: No Food Insecurity   Worried About Charity fundraiser in the Last Year: Never true   Ran Out of Food in the Last Year: Never true  Transportation Needs: Not on file  Physical Activity: Not on file  Stress: Not on file  Social Connections: Not on file    Allergies  Allergen Reactions   Latex Other (See Comments)    Burn in vaginal area with latex condoms    Outpatient Medications Prior to Visit  Medication Sig Dispense Refill   AMBULATORY NON FORMULARY MEDICATION 90 ml viscous lidocaine,  90 ml '10mg'$ /5 ml Dicyclomine, 270 ml malox. Take 10 ml twice daily. 450 mL 0   baclofen (LIORESAL) 10 MG tablet Take 1 tablet (10 mg total) by mouth 2 (two) times daily. 30 each 2   diclofenac Sodium (VOLTAREN) 1 % GEL Apply 2 g topically 4 (four) times daily. 100 g 0   FLUoxetine (PROZAC) 40 MG capsule Take 1 capsule (40 mg total) by mouth daily. 30 capsule 3   gabapentin (NEURONTIN) 400 MG capsule Take 1 capsule (400 mg total) by mouth 4 (four) times daily. 120 capsule 1   hyoscyamine (LEVSIN SL) 0.125 MG SL tablet Place 1 tablet (0.125 mg total) under the tongue every 6 (six) hours as needed. (Patient not taking: Reported on 09/15/2021) 40 tablet 0   lidocaine (LIDODERM) 5 % Place 1 patch onto the skin daily.  30 patch 3   megestrol (MEGACE) 40 MG tablet Take 1 tablet (40 mg total) by mouth daily. 30 tablet 0   metroNIDAZOLE (METROGEL) 0.75 % vaginal gel Place 1 Applicatorful vaginally at bedtime. 70 g 0   ondansetron (ZOFRAN-ODT) 4 MG disintegrating tablet Take 1 tablet (4 mg total) by mouth every 8 (eight) hours as needed for nausea or vomiting. 20 tablet 0   pantoprazole (PROTONIX) 40 MG tablet Take 1 tablet (40 mg total) by mouth 2 (two) times daily before a meal. 120 tablet 1   terconazole (TERAZOL 3) 0.8 % vaginal cream Place 1 applicator vaginally at bedtime. 20 g 0   hydrOXYzine (ATARAX) 25 MG tablet Take 1 tablet (25 mg total) by mouth 3 (three) times daily. 60 tablet 1   tiZANidine (ZANAFLEX) 2 MG tablet Take 1 tablet (2 mg total) by mouth every 6 (six) hours. 60 tablet 0   No facility-administered medications prior to visit.     ROS Review of Systems  Constitutional:  Negative for activity change and appetite change.  HENT:  Negative for sinus pressure and sore throat.   Respiratory:  Negative for chest tightness, shortness of breath and wheezing.   Cardiovascular:  Negative for chest pain and palpitations.  Gastrointestinal:  Negative for abdominal distention, abdominal pain and constipation.  Genitourinary: Negative.   Musculoskeletal:        See HPI  Psychiatric/Behavioral:  Negative for behavioral problems and dysphoric mood.    Objective:  BP 120/82   Pulse 89   Resp 16   Wt 184 lb 3.2 oz (83.6 kg)   LMP 09/23/2021 Comment: tubal  SpO2 98%   BMI 28.85 kg/m      09/26/2021    3:43 PM 09/15/2021   10:11 AM 09/09/2021   10:18 AM  BP/Weight  Systolic BP 287 681 157  Diastolic BP 82 70 80  Wt. (Lbs) 184.2 184 181  BMI 28.85 kg/m2 28.82 kg/m2 28.35 kg/m2      Physical Exam Constitutional:      Appearance: She is well-developed.  Cardiovascular:     Rate and Rhythm: Normal rate.     Heart sounds: Normal heart sounds. No murmur heard. Pulmonary:     Effort:  Pulmonary effort is normal.     Breath sounds: Normal breath sounds. No wheezing or rales.  Chest:     Chest wall: No tenderness.  Abdominal:     General: Bowel sounds are normal. There is no distension.     Palpations: Abdomen is soft. There is no mass.     Tenderness: There is no abdominal tenderness.  Musculoskeletal:  General: Normal range of motion.     Cervical back: Tenderness (on palpation of L side of neck) present.     Right lower leg: No edema.     Left lower leg: No edema.  Neurological:     Mental Status: She is alert and oriented to person, place, and time.  Psychiatric:        Mood and Affect: Mood normal.       Latest Ref Rng & Units 06/10/2021    1:54 AM 06/09/2021    8:56 PM 05/12/2021   10:19 PM  CMP  Glucose 70 - 99 mg/dL  96   95    BUN 6 - 20 mg/dL  9   11    Creatinine 0.44 - 1.00 mg/dL  0.81   0.84    Sodium 135 - 145 mmol/L  137   133    Potassium 3.5 - 5.1 mmol/L  3.6   3.6    Chloride 98 - 111 mmol/L  104   102    CO2 22 - 32 mmol/L  25   25    Calcium 8.9 - 10.3 mg/dL  9.8   9.1    Total Protein 6.5 - 8.1 g/dL 7.4    8.1    Total Bilirubin 0.3 - 1.2 mg/dL 0.5    0.6    Alkaline Phos 38 - 126 U/L 87    96    AST 15 - 41 U/L 12    19    ALT 0 - 44 U/L 16    19      Lipid Panel  No results found for: CHOL, TRIG, HDL, CHOLHDL, VLDL, LDLCALC, LDLDIRECT  CBC    Component Value Date/Time   WBC 9.8 06/09/2021 2056   RBC 4.64 06/09/2021 2056   HGB 13.3 06/09/2021 2056   HCT 39.4 06/09/2021 2056   PLT 294 06/09/2021 2056   MCV 84.9 06/09/2021 2056   MCH 28.7 06/09/2021 2056   MCHC 33.8 06/09/2021 2056   RDW 13.2 06/09/2021 2056   LYMPHSABS 3.6 05/12/2021 2219   MONOABS 0.7 05/12/2021 2219   EOSABS 0.3 05/12/2021 2219   BASOSABS 0.1 05/12/2021 2219    Lab Results  Component Value Date   HGBA1C  12/24/2007    5.2 (NOTE)   The ADA recommends the following therapeutic goal for glycemic   control related to Hgb A1C measurement:   Goal  of Therapy:   < 7.0% Hgb A1C   Reference: American Diabetes Association: Clinical Practice   Recommendations 2008, Diabetes Care,  2008, 31:(Suppl 1).    Assessment & Plan:  1. Cervical radiculopathy Uncontrolled Status post trigger point injection Completed sessions of PT Continue with gabapentin, tizanidine, Lidoderm patch We have discussed role of aquatic therapy and she will look into this Keep upcoming appointment with rehab medicine - tiZANidine (ZANAFLEX) 2 MG tablet; Take 1 tablet (2 mg total) by mouth every 6 (six) hours as needed for muscle spasms.  Dispense: 60 tablet; Refill: 6  2. Anxiety and depression Controlled Continue Prozac, hydroxyzine - hydrOXYzine (ATARAX) 25 MG tablet; Take 1 tablet (25 mg total) by mouth 3 (three) times daily.  Dispense: 60 tablet; Refill: 3  3. Seasonal allergies Uncontrolled on OTC oral medications We will place on Flonase - fluticasone (FLONASE) 50 MCG/ACT nasal spray; Place 2 sprays into both nostrils daily.  Dispense: 16 g; Refill: 6   Health Care Maintenance: Has appointment for Pap smear on 10/11/2021.  Up-to-date on mammogram.  Meds ordered this encounter  Medications   fluticasone (FLONASE) 50 MCG/ACT nasal spray    Sig: Place 2 sprays into both nostrils daily.    Dispense:  16 g    Refill:  6   tiZANidine (ZANAFLEX) 2 MG tablet    Sig: Take 1 tablet (2 mg total) by mouth every 6 (six) hours as needed for muscle spasms.    Dispense:  60 tablet    Refill:  6   hydrOXYzine (ATARAX) 25 MG tablet    Sig: Take 1 tablet (25 mg total) by mouth 3 (three) times daily.    Dispense:  60 tablet    Refill:  3    Follow-up: Return in about 6 months (around 03/28/2022).       Charlott Rakes, MD, FAAFP. [facility], [place]   09/26/2021, 4:14 PM

## 2021-09-26 NOTE — Patient Instructions (Signed)

## 2021-09-27 NOTE — Telephone Encounter (Signed)
Called and spoke to the patient about the findings of an enlarged liver on her CT imaging. I explained to her that an enlarged liver can sometimes be due to an underlying hepatitis or alternative liver condition. The hepatomegaly could also potentially be a benign incidental finding. At her next clinic visit I will plan to perform a liver work up for her. There were no signs of any concerning lesions on the CT scan, which is reassuring. Patient was appreciative of the information.  Also called [facility] and spoke to Dr. Kris Hartmann who reviewed the imaging. He confirmed that the CT did not show any signs of vessel obstruction or infiltrative disease in the liver. 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 He did not recommend any further imaging to better visualize the liver.  Ammie, let's keep her current clinic appointment. She wanted to ask if she has enough refills of her pantoprazole medication. I checked and thought that she had enough refills, but if you wouldn't mind double checking for me, that would be great. Thank you for your help.

## 2021-09-28 ENCOUNTER — Other Ambulatory Visit: Payer: Self-pay

## 2021-09-30 ENCOUNTER — Encounter: Payer: 59 | Attending: Internal Medicine | Admitting: Registered"

## 2021-09-30 DIAGNOSIS — R634 Abnormal weight loss: Secondary | ICD-10-CM | POA: Diagnosis present

## 2021-09-30 NOTE — Progress Notes (Signed)
Medical Nutrition Therapy:  Appt start time: 1287 end time:  1217.  Assessment:  Primary concerns today: Pt referred due to loss of weight, dysphagia.   Nutrition Follow-Up: Pt present for appointment.   Pt reports liver was enlarged on CT scan but will have f/u with her doctor for further assessment. Pt reports she saw a counselor and it has been really helpful. Reports she saw them 3 times and will be seeing someone else next time due to counselor she previously saw is leaving that practice. Pt reports this has really helped with anxiety. Reports usually she would have gotten very worried and started looking up things online about her liver showing enlargement but reports she was able to stay calm because of seeing a counselor. Also reports her counselor recommended some gummies that she's been taking for calmness. Pt unsure about name of the gummies.  Pt reports last night she ate some steak and afterward starting having burning in chest and stomach (acid reflux). Reports otherwise acid reflux has been up and down. Reports having more days she doesn't feel like eating consistent due to acid reflux than good days. Reports she tries to eat regularly to prevent reflux but sometimes is hard to do. Today has had 1 Ensure and 2 fruit cups. Pt reports doing 2 Ensure, water, and sometimes Ginger Ale on occasion as main beverages. Reports doing some soda recently and having acid reflux afterward.   Food Allergies/Intolerances: None reported.   GI Concerns: Acid reflux but some improved.    Pertinent Lab Values:  06/21/21:  B12: 1316 (H)  Preferred Learning Style:  No preference indicated   Learning Readiness:  Ready  MEDICATIONS: Reviewed. See list.    DIETARY INTAKE:  Usual eating pattern includes 2-3 meals and 0 snacks per day.   Common foods: banana, Ensure.  Avoided foods: spicy foods, acidic and high fat foods.    Typical Snacks: None reported.     Typical Beverages: water, Body  Armour, Vitamin Water.  Location of Meals: Breakfast in car on the way to work, other meals at home.   Electronics Present at [employer]: N/A  24-hr recall:  Breakfast 715 Ensure Snack 815: peanut butter sandwich, fruit cup  Lunch: None reported Snack 3 PM: [employer] cheese roll ups, water  Dinner 630 PM: steak, water   Snack:  Beverages:    Grilled chicken, green beans, Coke Wednesday around 630-7 PM and the had reflux.   Usual physical activity: None reported.   Progress Towards Goal(s):  Some progress.   Nutritional Diagnosis:  NI-2.1 Inadequate intake As related to dx GERD.  As evidenced by pt reports low appetite, concern about what to eat to prevent GERD.    Intervention:  Nutrition counseling provided. Dietitian praised pt for working to eat more consistently. Discussed caffeinated soda often causes reflux-advise avoiding. Discussed pt keeping food log to help ID problem foods. Pt appeared agreeable with information/goals discussed.   Continued Instructions/Goals:  Eat first meal within 1 hour of waking and eat after that time every 3-4 hours while awake. If low appetite, include at least a balanced snack with carb and protein every 3-4 hours and your Ensures per usual.   Avoid eating within 3 hours of laying down.   calm eating environment as much as possible. Eat slowly and chew to applesauce like texture.   Include smooth foods that go down well. At meals, include a grain/starch, a protein and a fruit/vegetable that is smooth. If unable to  eat a meal, drink an Ensure.  Avoid spicy foods, acidic foods like tomatoes or citrus, high fat foods like whole milk or fried foods, chocolate, caffeinated drinks, pepper, spearmint and peppermint (see full list) to help prevent acid reflux.   Recommend taking a picture of your foods and writing down if you have any reflux episodes. This way it will be quick to document and help identify which foods/drinks might be causing reflux  without eliminating those that are doing good for your body.   Great job with starting counseling!   Teaching Method Utilized:  Visual Auditory  Barriers to learning/adherence to lifestyle change: GERD, ongoing pain/discomfort.   Demonstrated degree of understanding via:  Teach Back   Monitoring/Evaluation:  Dietary intake, exercise, and body weight in 1 month(s).

## 2021-09-30 NOTE — Patient Instructions (Addendum)
Continued Instructions/Goals:  Eat first meal within 1 hour of waking and eat after that time every 3-4 hours while awake. If low appetite, include at least a balanced snack with carb and protein every 3-4 hours and your Ensures per usual.   Avoid eating within 3 hours of laying down.   Have a calm eating environment as much as possible. Eat slowly and chew to applesauce like texture.   Include smooth foods that go down well. At meals, include a grain/starch, a protein and a fruit/vegetable that is smooth. If unable to eat a meal, drink an Ensure.  Avoid spicy foods, acidic foods like tomatoes or citrus, high fat foods like whole milk or fried foods, chocolate, caffeinated drinks, pepper, spearmint and peppermint (see full list) to help prevent acid reflux.   Recommend taking a picture of your foods and writing down if you have any reflux episodes. This way it will be quick to document and help identify which foods/drinks might be causing reflux without eliminating those that are doing good for your body.   Great job with starting counseling!

## 2021-10-08 ENCOUNTER — Encounter: Payer: Self-pay | Admitting: Registered"

## 2021-10-11 ENCOUNTER — Other Ambulatory Visit (HOSPITAL_COMMUNITY)
Admission: RE | Admit: 2021-10-11 | Discharge: 2021-10-11 | Disposition: A | Discharge status: Home / Self Care | Payer: 59 | Source: Ambulatory Visit | Attending: Advanced Practice Midwife | Admitting: Advanced Practice Midwife

## 2021-10-11 ENCOUNTER — Encounter (HOSPITAL_BASED_OUTPATIENT_CLINIC_OR_DEPARTMENT_OTHER): Payer: Self-pay | Admitting: Advanced Practice Midwife

## 2021-10-11 ENCOUNTER — Other Ambulatory Visit (HOSPITAL_BASED_OUTPATIENT_CLINIC_OR_DEPARTMENT_OTHER): Payer: Self-pay | Admitting: Obstetrics & Gynecology

## 2021-10-11 ENCOUNTER — Ambulatory Visit (HOSPITAL_BASED_OUTPATIENT_CLINIC_OR_DEPARTMENT_OTHER): Payer: 59 | Admitting: Advanced Practice Midwife

## 2021-10-11 VITALS — BP 117/94 | HR 79 | Ht 67.0 in | Wt 184.6 lb

## 2021-10-11 DIAGNOSIS — D259 Leiomyoma of uterus, unspecified: Secondary | ICD-10-CM

## 2021-10-11 DIAGNOSIS — Z01419 Encounter for gynecological examination (general) (routine) without abnormal findings: Secondary | ICD-10-CM

## 2021-10-11 DIAGNOSIS — R7989 Other specified abnormal findings of blood chemistry: Secondary | ICD-10-CM | POA: Diagnosis not present

## 2021-10-11 DIAGNOSIS — Z124 Encounter for screening for malignant neoplasm of cervix: Secondary | ICD-10-CM

## 2021-10-11 DIAGNOSIS — N939 Abnormal uterine and vaginal bleeding, unspecified: Secondary | ICD-10-CM | POA: Diagnosis not present

## 2021-10-11 DIAGNOSIS — Z113 Encounter for screening for infections with a predominantly sexual mode of transmission: Secondary | ICD-10-CM | POA: Diagnosis not present

## 2021-10-11 DIAGNOSIS — D251 Intramural leiomyoma of uterus: Secondary | ICD-10-CM

## 2021-10-11 NOTE — Progress Notes (Signed)
   Subjective:     Laurie Bell is a 41 y.o. female here at [facility] for a routine exam.  Current complaints: intermittent abdominal cramping and irregular vaginal bleeding. Bleeding is scant, brown, but frequent. She has hx uterine fibroids.   Personal health questionnaire reviewed: yes.  Do you have a primary care provider? yes Do you feel safe at home? yes  [place] Office Visit from 09/26/2021 in [facility]  PHQ-2 Total Score 3       Health Maintenance Due  Topic Date Due   PAP SMEAR-Modifier  Never done     Risk factors for chronic health problems: Smoking: Alchohol/how much: Pt BMI: Body mass index is 28.91 kg/m.   Gynecologic History Patient's last menstrual period was 09/23/2021. Contraception: tubal ligation Last Pap: unknown. Results were: normal Last mammogram: 07/06/21. Results were: normal  Obstetric History OB History  Gravida Para Term Preterm AB Living  '2 2 2 '$ 0 0 2  SAB IAB Ectopic Multiple Live Births  0 0 0 0      # Outcome Date GA Lbr Len/2nd Weight Sex Delivery Anes PTL Lv  2 Term      Vag-Spont     1 Term      Vag-Spont        The following portions of the patient's history were reviewed and updated as appropriate: allergies, current medications, past family history, past medical history, past social history, past surgical history, and problem list.  Review of Systems Pertinent items noted in HPI and remainder of comprehensive ROS otherwise negative.    Objective:   BP (!) 117/94   Pulse 79   Ht '5\' 7"'$  (1.702 m)   Wt 184 lb 9.6 oz (83.7 kg)   LMP 09/23/2021 Comment: tubal  BMI 28.91 kg/m  VS reviewed, nursing note reviewed,  Constitutional: well developed, well nourished, no distress HEENT: normocephalic CV: normal rate Pulm/chest wall: normal effort Breast Exam:  exam performed: right breast normal without mass, skin or nipple changes or axillary nodes, left breast normal without mass,  skin or nipple changes or axillary nodes Abdomen: soft Neuro: alert and oriented x 3 Skin: warm, dry Psych: affect normal Pelvic exam: Performed: Cervix pink, visually closed, without lesion, scant white creamy discharge, vaginal walls and external genitalia normal Bimanual exam: Cervix 0/long/high, firm, anterior, neg CMT, uterus nontender, nonenlarged, adnexa without tenderness, enlargement, or mass       Assessment/Plan:   1. Screening for cervical cancer  - Cytology - PAP( [facility])  2. Routine screening for STI (sexually transmitted infection) --Pt declined serum testing- Cervicovaginal ancillary only( [place])  3. Well woman exam with routine gynecological exam   4. Uterine leiomyoma, unspecified location --F/U ultrasound ordered for [facility]  5. Abnormal uterine bleeding (AUB) -Discussed management options, including medications, IUD, and ablation and other surgical procedures.  Pt interested in IUD vs oral medications.   --F/U with Dr Sabra Heck to discuss after [place]    6. Abnormal serum thyroid stimulating hormone (TSH) level --Pt asked about abnormal thyroid labs. She has f/u in June with endocrinology. --On chart review, TSH low at 0.207 but free T3 and T4 wnl on 12/6 and 05/18/21. Reviewed with Dr Sabra Heck. --Pt to keep follow up appointments as scheduled.     Return for Ultrasound in office then follow up appointment on different day with Dr Sabra Heck.   Fatima Blank, CNM 10:56 AM

## 2021-10-12 ENCOUNTER — Other Ambulatory Visit: Payer: Self-pay | Admitting: Advanced Practice Midwife

## 2021-10-12 ENCOUNTER — Telehealth: Payer: Self-pay | Admitting: Internal Medicine

## 2021-10-12 ENCOUNTER — Other Ambulatory Visit: Payer: Self-pay

## 2021-10-12 DIAGNOSIS — K219 Gastro-esophageal reflux disease without esophagitis: Secondary | ICD-10-CM

## 2021-10-12 DIAGNOSIS — B9689 Other specified bacterial agents as the cause of diseases classified elsewhere: Secondary | ICD-10-CM

## 2021-10-12 LAB — CERVICOVAGINAL ANCILLARY ONLY
Bacterial Vaginitis (gardnerella): POSITIVE — AB
Candida Glabrata: NEGATIVE
Candida Vaginitis: NEGATIVE
Chlamydia: NEGATIVE
Comment: NEGATIVE
Comment: NEGATIVE
Comment: NEGATIVE
Comment: NEGATIVE
Comment: NEGATIVE
Comment: NORMAL
Neisseria Gonorrhea: NEGATIVE
Trichomonas: NEGATIVE

## 2021-10-12 MED ORDER — PANTOPRAZOLE SODIUM 40 MG PO TBEC: 40.0000 mg | DELAYED_RELEASE_TABLET | Freq: Two times a day (BID) | ORAL | 1 refills | Status: DC

## 2021-10-12 MED ORDER — METRONIDAZOLE 500 MG PO TABS: 500.0000 mg | ORAL_TABLET | Freq: Two times a day (BID) | ORAL | 0 refills | Status: AC

## 2021-10-12 NOTE — Telephone Encounter (Signed)
Prior Authorization for Pantoprazole 40 mg was approved until 09/29/2022. Patient and pharmacy notified.

## 2021-10-12 NOTE — Telephone Encounter (Signed)
Inbound call from patient stating that we were supposed to be receiving paperwork to fill out from her insurance for Protonix. Patient stated that its to show that she needs to be taking the medication 2 times daily and for a refill since she only has 2 pills left.  Patient gave the Pharmacy provider number [phone], to see if we can follow up. Please advise.

## 2021-10-12 NOTE — Telephone Encounter (Signed)
[employer] and did prior authorization over the phone case number is [id number]

## 2021-10-13 LAB — CYTOLOGY - PAP
Comment: NEGATIVE
Comment: NEGATIVE
Comment: NEGATIVE
Diagnosis: NEGATIVE
HPV 16: NEGATIVE
HPV 18 / 45: NEGATIVE
High risk HPV: POSITIVE — AB

## 2021-10-17 ENCOUNTER — Encounter: Payer: Self-pay | Admitting: Advanced Practice Midwife

## 2021-10-17 DIAGNOSIS — R87810 Cervical high risk human papillomavirus (HPV) DNA test positive: Secondary | ICD-10-CM | POA: Insufficient documentation

## 2021-10-26 ENCOUNTER — Encounter: Payer: Self-pay | Admitting: Internal Medicine

## 2021-10-26 ENCOUNTER — Ambulatory Visit: Payer: 59 | Admitting: Internal Medicine

## 2021-10-26 ENCOUNTER — Ambulatory Visit (INDEPENDENT_AMBULATORY_CARE_PROVIDER_SITE_OTHER): Payer: 59 | Admitting: Internal Medicine

## 2021-10-26 VITALS — BP 110/70 | HR 80 | Ht 67.0 in | Wt 185.0 lb

## 2021-10-26 DIAGNOSIS — R109 Unspecified abdominal pain: Secondary | ICD-10-CM | POA: Diagnosis not present

## 2021-10-26 DIAGNOSIS — K219 Gastro-esophageal reflux disease without esophagitis: Secondary | ICD-10-CM | POA: Diagnosis not present

## 2021-10-26 DIAGNOSIS — R0789 Other chest pain: Secondary | ICD-10-CM | POA: Diagnosis not present

## 2021-10-26 MED ORDER — BACLOFEN 5 MG PO TABS: 5.0000 mg | ORAL_TABLET | Freq: Two times a day (BID) | ORAL | 2 refills | Status: DC

## 2021-10-26 MED ORDER — FAMOTIDINE 20 MG PO TABS: 20.0000 mg | ORAL_TABLET | Freq: Two times a day (BID) | ORAL | 3 refills | Status: DC

## 2021-10-26 NOTE — Patient Instructions (Addendum)
If you are age 42 or older, your body mass index should be between 23-30. Your Body mass index is 28.98 kg/m. If this is out of the aforementioned range listed, please consider follow up with your Primary Care Provider.  If you are age 24 or younger, your body mass index should be between 19-25. Your Body mass index is 28.98 kg/m. If this is out of the aformentioned range listed, please consider follow up with your Primary Care Provider.   Try a one week trail of Miralax and Benefiber.  The [facility] providers would like to encourage you to use [facility] to communicate with providers for non-urgent requests or questions.  Due to long hold times on the telephone, sending your provider a message by [facility] may be a faster and more efficient way to get a response.  Please allow 48 business hours for a response.  Please remember that this is for non-urgent requests.   Thank you for entrusting me with your care and for choosing [facility], Dr. Christia Reading

## 2021-10-26 NOTE — Progress Notes (Signed)
Chief Complaint: GERD  HPI : 41 year old female with history of GERD, anxiety, and athritis presents for follow up of GERD  Interval History: Patient has been doing okay. She is still having some sensations of uncontrolled acid reflux. She did not increase the dose of her baclofen because sh was also started on tizanidine, and her PCP encouraged her to not take both medications at the same time. She has been taking her Protonix and using GI cocktail.   Wt Readings from Last 3 Encounters:  10/26/21 185 lb (83.9 kg)  10/11/21 184 lb 9.6 oz (83.7 kg)  09/30/21 180 lb 9.6 oz (81.9 kg)    Current Outpatient Medications  Medication Sig Dispense Refill   AMBULATORY NON FORMULARY MEDICATION 90 ml viscous lidocaine, 90 ml '10mg'$ /5 ml Dicyclomine, 270 ml malox. Take 10 ml twice daily. 450 mL 0   baclofen (LIORESAL) 10 MG tablet Take 1 tablet (10 mg total) by mouth 2 (two) times daily. 30 each 2   diclofenac Sodium (VOLTAREN) 1 % GEL Apply 2 g topically 4 (four) times daily. 100 g 0   FLUoxetine (PROZAC) 40 MG capsule Take 1 capsule (40 mg total) by mouth daily. 30 capsule 3   fluticasone (FLONASE) 50 MCG/ACT nasal spray Place 2 sprays into both nostrils daily. 16 g 6   gabapentin (NEURONTIN) 400 MG capsule Take 1 capsule (400 mg total) by mouth 4 (four) times daily. 120 capsule 1   hydrOXYzine (ATARAX) 25 MG tablet Take 1 tablet (25 mg total) by mouth 3 (three) times daily. 60 tablet 3   lidocaine (LIDODERM) 5 % Place 1 patch onto the skin daily. 30 patch 3   ondansetron (ZOFRAN-ODT) 4 MG disintegrating tablet Take 1 tablet (4 mg total) by mouth every 8 (eight) hours as needed for nausea or vomiting. 20 tablet 0   pantoprazole (PROTONIX) 40 MG tablet Take 1 tablet (40 mg total) by mouth 2 (two) times daily before a meal. 120 tablet 1   tiZANidine (ZANAFLEX) 2 MG tablet Take 1 tablet (2 mg total) by mouth every 6 (six) hours as needed for muscle spasms. 60 tablet 6   No current facility-administered  medications for this visit.   Review of Systems: All systems reviewed and negative except where noted in HPI.   Physical Exam: BP 110/70   Pulse 80   Ht '5\' 7"'$  (1.702 m)   Wt 185 lb (83.9 kg)   BMI 28.98 kg/m  Constitutional: Pleasant,well-developed, female in no acute distress. HEENT: Normocephalic and atraumatic. Conjunctivae are normal. No scleral icterus. Cardiovascular: Normal rate, regular rhythm.  Pulmonary/chest: Effort normal and breath sounds normal. No wheezing, rales or rhonchi. Abdominal: Soft, nondistended, nontender. Bowel sounds active throughout. There are no masses palpable. No hepatomegaly. Extremities: No edema Neurological: Alert and oriented to person place and time. Skin: Skin is warm and dry. No rashes noted. Psychiatric: Normal mood and affect. Behavior is normal.  Labs 05/2021: Lipase nml. LFTs nml. CBC and BMP nml. TSH mildly low. FT4 and FT3 nml.  Labs 06/2021: Vit D nml, Vit B12 nml. HCV Ab negative.  CT renal stone study 12/20/16: IMPRESSION: 1. No acute intra-abdominal or pelvic pathology. Specifically there is no hydronephrosis or nephrolithiasis. 2. Moderate colonic stool burden. No bowel obstruction or active inflammation. Normal appendix. 3. Prominent uterus with poorly visualized fibroid.  Barium swallow 08/26/20: IMPRESSION: Moderate gastroesophageal reflux Negative for peptic ulcer disease. 3 mm calcification to the left of L2-3 disc space is new since yesterday.  Question symptoms of left renal colic.  CT A/P w/contrast 09/25/21: IMPRESSION: 1. Known uterine fibroid that appears grossly stable in size better evaluated on pelvic ultrasound 10/08/2016. 2. Hepatomegaly. 3. Constipation. 4. Otherwise no acute intra-abdominal or intrapelvic abnormality.  Speech therapy evaluation 05/27/21: Pt presents with a functional dysphagia that has a significant negative impact on her life - she avoids eating in restaurants; she limits her diet to a  narrow selection of foods; she has lost weight.  During today's study, Ms. Baxendale self-limited bolus sizes (solid foods ~1/2 tspn; liquids ~ 5 ml) and required encouragement to consume >10 ml sips of liquid or tablespoon sized solid boluses.  She verbalized her concerns about increasing bolus size and became tearful and apologetic and discussed her fears related to swallowing.  Offered encouragement.  Imaging revealed normal biomechanics of the swallow with thorough mastication, timely swallow response, normal pharyngeal squeeze, reliable laryngeal vestibule closure, no penetration/aspiration, no obvious stasis in esophagus upon brief screen.  Ms. Culliton identifed globus sensation during the study. We watched the study together in real time; we discussed the anatomical separation of the airway from the esophagus and discussed that the feeling of pressure when eating is related to her esophagus, but that her breathing is not compromised.  She was encouraged to record a few frames of the video as affirmation of the normal mechanics of her swallowing.  We discussed recommendations, and Ms. Glassco agreed she might benefit from OP SLP to address the functional nature of her dysphagia. Recommend referral to [facility] neuro OP clinic to address the aforementioned condition.  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 - Non-bleeding gastric ulcers with a clean ulcer base (Forrest Class III). Biopsied. - Normal examined duodenum. - Biopsies were taken with a cold forceps for histology in the proximal esophagus and in the distal esophagus. Path: 1. Surgical [P], gastric antrum and gastric body - GASTRIC ANTRAL MUCOSA WITH MILD NONSPECIFIC REACTIVE GASTROPATHY - GASTRIC OXYNTIC MUCOSA WITH NO SPECIFIC HISTOPATHOLOGIC CHANGES - WARTHIN STARRY STAIN IS NEGATIVE FOR HELICOBACTER PYLORI 2. Surgical [P], distal esophagus - ESOPHAGEAL SQUAMOUS AND CARDIAC MUCOSA WITH REFLUX-ASSOCIATED CHANGES - NEGATIVE FOR  INTESTINAL METAPLASIA OR DYSPLASIA 3. 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 - Normal stomach. - Normal examined duodenum. - No specimens collected.  Esophageal manometry 07/27/21: Normal.  ASSESSMENT AND PLAN: GERD Dysphagia Abdominal pain Zenker's diverticulum Patient has continued to have some symptoms of GERD, though overall these have improved on the therapies that have been started. Will try to start her on Pepcid to see if this helps with her reflux symptoms. Will also have the patient do a week long trial of Miralax to see if this help with her symptoms. - GERD handout - Continue pantoprazole 40 mg BID - Reduce baclofen from 10 mg BID to 5 mg BID  - Start Pepcid 20 mg BID PRN - Start Miralax daily and fiber daily for a week - Cont Zofran - GI cocktail PRN - RTC 2 months. Consider colonoscopy in the future  Christia Reading, MD

## 2021-10-28 ENCOUNTER — Encounter: Payer: Self-pay | Admitting: Physical Medicine & Rehabilitation

## 2021-10-28 ENCOUNTER — Encounter: Payer: 59 | Attending: Physical Medicine & Rehabilitation | Admitting: Physical Medicine & Rehabilitation

## 2021-10-28 VITALS — BP 118/85 | HR 78 | Ht 67.0 in | Wt 185.0 lb

## 2021-10-28 DIAGNOSIS — M542 Cervicalgia: Secondary | ICD-10-CM | POA: Insufficient documentation

## 2021-10-28 MED ORDER — PREGABALIN 75 MG PO CAPS: 75.0000 mg | ORAL_CAPSULE | Freq: Three times a day (TID) | ORAL | 1 refills | Status: DC
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
## 2021-10-28 NOTE — Patient Instructions (Signed)
Cont stretching

## 2021-11-03 ENCOUNTER — Ambulatory Visit (INDEPENDENT_AMBULATORY_CARE_PROVIDER_SITE_OTHER): Payer: 59 | Admitting: Licensed Clinical Social Worker

## 2021-11-03 DIAGNOSIS — F411 Generalized anxiety disorder: Secondary | ICD-10-CM

## 2021-11-03 DIAGNOSIS — F331 Major depressive disorder, recurrent, moderate: Secondary | ICD-10-CM | POA: Diagnosis not present

## 2021-11-03 NOTE — Progress Notes (Signed)
Comprehensive Clinical Assessment (CCA) Note  11/03/2021 Laurie Bell [id number]  Chief Complaint:  Chief Complaint  Patient presents with   Depression   Anxiety   Visit Diagnosis: MDD and GAD    Virtual Visit via Video Note  I connected with Laurie Bell on 11/03/21 at  9:00 AM EDT by a video enabled telemedicine application and verified that I am speaking with the correct person using two identifiers.  Location: Patient: [facility]  Provider: [facility]    I discussed the limitations of evaluation and management by telemedicine and the availability of in person appointments. The patient expressed understanding and agreed to proceed.      I discussed the assessment and treatment plan with the patient. The patient was provided an opportunity to ask questions and all were answered. The patient agreed with the plan and demonstrated an understanding of the instructions.   The patient was advised to call back or seek an in-person evaluation if the symptoms worsen or if the condition fails to improve as anticipated.  I provided 40 minutes of non-face-to-face time during this encounter.   Dory Horn, LCSW   Client is a 41 year old female. Client is referred by PCP for a depression and anxiety.    Client states mental health symptoms as evidenced by:   Depression Difficulty Concentrating; Increase/decrease in appetite; Irritability; Weight gain/loss; Tearfulness; Sleep (too much or little) Difficulty Concentrating; Increase/decrease in appetite; Irritability; Weight gain/loss; Tearfulness; Sleep (too much or little)  Duration of Depressive Symptoms Greater than two weeks Greater than two weeks  Mania None None  Anxiety Irritability; Restlessness; Tension; Worrying; Sleep; Difficulty concentrating Irritability; Restlessness; Tension; Worrying; Sleep; Difficulty concentrating  Psychosis None None  Trauma None None  Obsessions None None  Compulsions  None None  Inattention None None  Hyperactivity/Impulsivity None None  Oppositional/Defiant Behaviors None None  Emotional Irregularity Chronic feelings of emptiness Chronic feelings of emptiness    Client denies suicidal and homicidal ideations at this time  Client denies hallucinations and delusions at this time   Client was screened for the following SDOH: Financials, stress\tension, social interaction, and depression  Assessment Information that integrates subjective and objective details with a therapist's professional interpretation:    Patient was alert and oriented x5.  Patient was pleasant, cooperative, maintained good eye contact.  She engaged well in therapy session and was dressed casually.  Patient presented today with anxious and depressed mood\affect.  Patient comes in today as a referral from primary care physician with history of depression and anxiety.  Patient reports primary stressors as family conflict and illness.  Patient reports back in November she has had a sudden onset of left-sided weakness.  Patient reports that she has followed up with her primary care physician and they have done test with a diagnosis of strained muscle.  Patient reports that she also has a lump that needs diagnostic assessment.  Patient reports good support system with family and church.  Other stressors for patient or family conflict.  Patient reports stressors for her youngest son having a child and running with the wrong crowd.  Patient reports she had to help him get out of [facility] as he was not a part of again but was hanging out with people that were affiliated with gangs.  Patient reports primary goals are to decrease depression and anxiety moving forward.  LCSW did create treatment plan.  LCSW administered PHQ-9 [place] severity rating scale and generalized anxiety scale.  Client  meets criteria for: GAD and MDD  Client states use of the following substances: None reported      Clinician assisted client with scheduling the following appointments: Next available. Clinician details of appointment.    Client was in agreement with treatment recommendations.   CCA Screening, Triage and Referral (STR)  Patient Reported Information Referral name: PCP    Whom do you see for routine medical problems? Primary Care  Practice/Facility Name: Dr. Margarita Rana    What Do You Feel Would Help You the Most Today? Treatment for Depression or other mood problem   Have You Recently Been in Any Inpatient Treatment (Hospital/Detox/Crisis Center/28-Day Program)? No   Have You Ever Received Services From [employer] Before? Yes  Who Do You See at [facility]? PCP   Have You Recently Had Any Thoughts About Hurting Yourself? No  Are You Planning to Commit Suicide/Harm Yourself At This time? No   Have you Recently Had Thoughts About [place]? No  Have You Used Any Alcohol or Drugs in the Past 24 Hours? No    Do You Currently Have a Therapist/Psychiatrist? No   Have You Been Recently Discharged From Any Office Practice or Programs? No     CCA Screening Triage Referral Assessment Type of Contact: Tele-Assessment  Is this Initial or Reassessment? Initial Assessment  Date Telepsych consult ordered in [facility]:  11/03/21  Time Telepsych consult ordered in [facility]:  0921   Is CPS involved or ever been involved? Never  Is APS involved or ever been involved? Never   Patient Determined To Be At Risk for Harm To Self or Others Based on Review of Patient Reported Information or Presenting Complaint? No  Location of Assessment: [facility] [place] of Residence: [place]    CCA Biopsychosocial Intake/Chief Complaint:  Anxiety and depression pt were referred here after speaking with PCP. Pt reports increased stress due to sudden onset of numbness in her left side. Pt reports they did a mammogram and that was negative. Pt reports she has  followed up with neurology for numbness and they feel it is a muscle spasm. Pt now has to get a diagnostic on lump in her neck.  Current Symptoms/Problems: tension, worry, anxiety attacks when around people.   Patient Reported Schizophrenia/Schizoaffective Diagnosis in Past: No   Strengths: willing to engage in treatment  Preferences: therapy.  Abilities: No data recorded  Type of Services Patient Feels are Needed: therapy   Initial Clinical Notes/Concerns: No data recorded  Mental Health Symptoms Depression:   Difficulty Concentrating; Increase/decrease in appetite; Irritability; Weight gain/loss; Tearfulness; Sleep (too much or little)   Duration of Depressive symptoms:  Greater than two weeks   Mania:   None   Anxiety:    Irritability; Restlessness; Tension; Worrying; Sleep; Difficulty concentrating   Psychosis:   None   Duration of Psychotic symptoms: No data recorded  Trauma:   None   Obsessions:   None   Compulsions:   None   Inattention:   None   Hyperactivity/Impulsivity:   None   Oppositional/Defiant Behaviors:   None   Emotional Irregularity:   Chronic feelings of emptiness   Other Mood/Personality Symptoms:  No data recorded   Mental Status Exam Appearance and self-care  Stature:   Average   Weight:   Overweight   Clothing:   Casual   Grooming:   Normal   Cosmetic use:   Age appropriate   Posture/gait:   Normal   Motor activity:  Not Remarkable   Sensorium  Attention:   Normal   Concentration:   Normal   Orientation:   X5   Recall/memory:   Normal   Affect and Mood  Affect:   Anxious; Depressed   Mood:   Depressed; Anxious   Relating  Eye contact:   Normal   Facial expression:   Anxious; Depressed   Attitude toward examiner:  No data recorded  Thought and Language  Speech flow:  Clear and Coherent   Thought content:   Appropriate to Mood and Circumstances   Preoccupation:   None    Hallucinations:   None   Organization:  No data recorded  [employer] of Knowledge:   Fair   Intelligence:   Average   Abstraction:   Functional   Judgement:   Fair   Art therapist:  No data recorded  Insight:   Fair   Decision Making:   Normal   Social Functioning  Social Maturity:   Isolates   Social Judgement:   Normal   Stress  Stressors:   Illness; Family conflict (son running with the wrong crowd, and just had a kid. Pt had to get him out of [place] to help him find the right path)   Coping Ability:   Overwhelmed   Skill Deficits:   Self-care; Intellect/education; Interpersonal   Supports:   Church; Family; Friends/Service system     Religion: Religion/Spirituality Are You A Religious Person?: Yes What is Your Religious Affiliation?: Christian  Leisure/Recreation: Leisure / Recreation Do You Have Hobbies?: Yes Leisure and Hobbies: cup and t shirts, likes to play basketball  Exercise/Diet: Exercise/Diet Do You Exercise?: Yes What Type of Exercise Do You Do?: Weight Training (circuit trainning) How Many Times a Week Do You Exercise?: 1-3 times a week Have You Gained or Lost A Significant Amount of Weight in the Past Six Months?: Yes-Lost Number of Pounds Lost?: 10 Do You Follow a Special Diet?: No Do You Have Any Trouble Sleeping?: Yes Explanation of Sleeping Difficulties: trouble staying and falling asleep.   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 Are You Satisfied With Your Job?: Yes Do You Work More Than One Job?: Yes Work Stressors: none Patient's Job has Been Impacted by Current Illness: No Has Patient ever Been in the [employer]?: No  Education: Education Is Patient Currently Attending School?: No Last Grade  Completed: 12 Did Teacher, adult education From [employer]?: Yes Did Physicist, medical?: No Did Heritage manager?: No Did You Have An Individualized Education Program (IIEP): No Did You Have Any Difficulty At [employer]?: No Patient's Education Has Been Impacted by Current Illness: No   CCA Family/Childhood History Family and Relationship History: Family history Marital status: Long term relationship Long term relationship, how long?: 6 years What types of issues is patient dealing with in the relationship?: none reported Are you sexually active?: Yes What is your sexual orientation?: hetrosexual Does patient have children?: Yes How many children?: 2 How is patient's relationship with their children?: son just graduated she is attempting to help he just had a child and was running with the wrong crown. 1 daughter: 81 years old she is pregnant, pt reports best friend.  Childhood History:  Childhood History By whom was/is the patient raised?: Mother, Grandparents Description of patient's relationship with caregiver when they were  a child: Grandmother: Real good Mother: On drugs off and on. Patient's description of current relationship with people who raised him/her: Grandmother: Passed away 09/16/2007. Mother: Good struggle at times. Does patient have siblings?: Yes Number of Siblings: 3 Description of patient's current relationship with siblings: good Did patient suffer any verbal/emotional/physical/sexual abuse as a child?: No Did patient suffer from severe childhood neglect?: No Has patient ever been sexually abused/assaulted/raped as an adolescent or adult?: No Was the patient ever a victim of a crime or a disaster?: No Witnessed domestic violence?: No Has patient been affected by domestic violence as an adult?: No  Child/Adolescent Assessment:     CCA Substance Use Alcohol/Drug Use: Alcohol / Drug Use History of alcohol / drug use?: No history of alcohol / drug abuse   DSM5  Diagnoses: Patient Active Problem List   Diagnosis Date Noted   Neck pain 10/28/2021   Cervical high risk HPV (human papillomavirus) test positive 10/17/2021   Irregular heart rhythm 04/15/2021   Numbness on left side 04/15/2021   Dysphagia 03/17/2021   Hypomagnesemia 02/23/2021   Gastroesophageal reflux disease    Hypokalemia 09/15/2020   Atypical chest pain Sep 15, 2020   Nausea and vomiting 2020/09/15   Hypocalcemia September 15, 2020   DUB (dysfunctional uterine bleeding) 01/12/2017   Fibroid, uterine 01/12/2017    Collaboration of Care: Other none today   Patient/Guardian was advised Release of Information must be obtained prior to any record release in order to collaborate their care with an outside provider. Patient/Guardian was advised if they have not already done so to contact the registration department to sign all necessary forms in order for [place] to release information regarding their care.   Consent: Patient/Guardian gives verbal consent for treatment and assignment of benefits for services provided during this visit. Patient/Guardian expressed understanding and agreed to proceed.   Dory Horn, LCSW

## 2021-11-03 NOTE — Plan of Care (Signed)
Pt agreeable to plan  ?

## 2021-11-16 ENCOUNTER — Ambulatory Visit: Payer: Self-pay | Admitting: *Deleted

## 2021-11-16 NOTE — Telephone Encounter (Signed)
Call was documented in wrong chart for patient. Patient will be called and information will be put in correct chart.  Correct chart is [employer]

## 2021-11-16 NOTE — Telephone Encounter (Signed)
  Chief Complaint: Shoulder pain Symptoms: Left shoulder pain since Nov. Saw PT, injections, did not help.Stated P.T. said they would do referral to ortho, Called and told they do not do referrals. Also reports left ankle swelling Frequency: Nov Pertinent Negatives: Patient denies  Disposition: '[]'$ ED /'[]'$ Urgent Care (no appt availability in office) / '[]'$ Appointment(In office/virtual)/ '[]'$  [facility]/ '[]'$ Home Care/ '[]'$ Refused Recommended Disposition /'[x]'$ [place] [facility]/ '[]'$  Follow-up with PCP Additional Notes: Pt states she has seen Dr. Margarita Rana in past 2 months. Unable to see record. Agent secured NP appt prior to triage.[facility], [facility]. Reason for Disposition . Shoulder pain is a chronic symptom (recurrent or ongoing AND present > 4 weeks)  Answer Assessment - Initial Assessment Questions 1. ONSET: "When did the pain start?"     Nov. 2. LOCATION: "Where is the pain located?"     Left shoulder 3. PAIN: "How bad is the pain?" (Scale 1-10; or mild, moderate, severe)   - MILD (1-3): doesn't interfere with normal activities   - MODERATE (4-7): interferes with normal activities (e.g., work or school) or awakens from sleep   - SEVERE (8-10): excruciating pain, unable to do any normal activities, unable to move arm at all due to pain     9/10. Constant lately, worsening 4. WORK OR EXERCISE: "Has there been any recent work or exercise that involved this part of the body?"     No 5. CAUSE: "What do you think is causing the shoulder pain?"     Unsure 6. OTHER SYMPTOMS: "Do you have any other symptoms?" (e.g., neck pain, swelling, rash, fever, numbness, weakness)     Left ankle swelling  Protocols used: Shoulder Pain-A-AH

## 2021-11-17 ENCOUNTER — Other Ambulatory Visit: Payer: Self-pay

## 2021-11-23 ENCOUNTER — Ambulatory Visit (INDEPENDENT_AMBULATORY_CARE_PROVIDER_SITE_OTHER): Payer: 59

## 2021-11-23 ENCOUNTER — Ambulatory Visit (INDEPENDENT_AMBULATORY_CARE_PROVIDER_SITE_OTHER): Payer: 59 | Admitting: Obstetrics & Gynecology

## 2021-11-23 ENCOUNTER — Encounter (HOSPITAL_BASED_OUTPATIENT_CLINIC_OR_DEPARTMENT_OTHER): Payer: Self-pay | Admitting: Obstetrics & Gynecology

## 2021-11-23 VITALS — BP 121/79 | HR 85 | Ht 66.0 in | Wt 190.8 lb

## 2021-11-23 DIAGNOSIS — N926 Irregular menstruation, unspecified: Secondary | ICD-10-CM

## 2021-11-23 DIAGNOSIS — D251 Intramural leiomyoma of uterus: Secondary | ICD-10-CM | POA: Diagnosis not present

## 2021-11-23 DIAGNOSIS — N83201 Unspecified ovarian cyst, right side: Secondary | ICD-10-CM

## 2021-11-23 MED ORDER — NORETHINDRONE ACETATE 5 MG PO TABS: 5.0000 mg | ORAL_TABLET | Freq: Two times a day (BID) | ORAL | 3 refills | Status: DC

## 2021-11-26 ENCOUNTER — Encounter (HOSPITAL_BASED_OUTPATIENT_CLINIC_OR_DEPARTMENT_OTHER): Payer: Self-pay | Admitting: Obstetrics & Gynecology

## 2021-11-26 NOTE — Progress Notes (Signed)
GYNECOLOGY  VISIT  CC:   h/o menorrhagia and uterine fibroid, follow up after ultrasound  HPI: 41 y.o. G17P2002 Single Black or African American female here for discussion of treatment options.  Pt has hx of endometrial ablation but continues to have heavy menstrual bleeding that can be longer at times.    Ultrasound today showed: Uterus: 9.1cm x 5.8cm x 4.9cm, anteverted and slightly enlarged.  Heterogenous myometrium with 2.7cm x 1.3cm submucosal fibroid (stable when compared to prior imaging).  Volume 135.86m   Endometrial thickness:  4.639m symmetric    Simple 3.7cm cyst on right ovary.  Findings reviewed.  Given she's had a novasure ablation, I would not be comfortable with IUD placement.  Pt was advised at last visit when seen by different provider to consider Nexplanon.  Bleeding profile discussed with pt as I don't think this is a great option for her.  She had seen some concerns about irregular bleeding with doing her own research so she had decided this wasn't what she wanted.  She is concerned about using estrogen products and any weight gain.  She is not interested in Depo Provera.  I don't think repeat ablation is a good option either.  Oral progesterone and definitive surgery discussed.  She is not ready to consider surgery at this point.  Would like to start progesterone.     Past Medical History:  Diagnosis Date   Anxiety    Arthritis    Chlamydia contact, treated    GERD (gastroesophageal reflux disease)     MEDS:   Current Outpatient Medications on File Prior to Visit  Medication Sig Dispense Refill   AMBULATORY NON FORMULARY MEDICATION 90 ml viscous lidocaine, 90 ml '10mg'$ /5 ml Dicyclomine, 270 ml malox. Take 10 ml twice daily. 450 mL 0   Baclofen 5 MG TABS Take 5 mg by mouth 2 (two) times daily. 90 tablet 2   diclofenac Sodium (VOLTAREN) 1 % GEL Apply 2 g topically 4 (four) times daily. 100 g 0   famotidine (PEPCID) 20 MG tablet Take 1 tablet (20 mg total) by mouth 2  (two) times daily. 30 tablet 3   FLUoxetine (PROZAC) 40 MG capsule Take 1 capsule (40 mg total) by mouth daily. 30 capsule 3   fluticasone (FLONASE) 50 MCG/ACT nasal spray Place 2 sprays into both nostrils daily. 16 g 6   hydrOXYzine (ATARAX) 25 MG tablet Take 1 tablet (25 mg total) by mouth 3 (three) times daily. 60 tablet 3   lidocaine (LIDODERM) 5 % Place 1 patch onto the skin daily. 30 patch 3   ondansetron (ZOFRAN-ODT) 4 MG disintegrating tablet Take 1 tablet (4 mg total) by mouth every 8 (eight) hours as needed for nausea or vomiting. 20 tablet 0   pantoprazole (PROTONIX) 40 MG tablet Take 1 tablet (40 mg total) by mouth 2 (two) times daily before a meal. 120 tablet 1   pregabalin (LYRICA) 75 MG capsule Take 1 capsule (75 mg total) by mouth 3 (three) times daily. 90 capsule 1   tiZANidine (ZANAFLEX) 2 MG tablet Take 1 tablet (2 mg total) by mouth every 6 (six) hours as needed for muscle spasms. 60 tablet 6   [DISCONTINUED] medroxyPROGESTERone (PROVERA) 10 MG tablet Take 2 tablets by mouth daily (Patient not taking: Reported on 06/05/2019) 60 tablet 0   No current facility-administered medications on file prior to visit.    ALLERGIES: Latex  SH:  single, non smoker  Review of Systems  Constitutional: Negative.   Genitourinary:  Heavy bleeding    PHYSICAL EXAMINATION:    BP 121/79 (BP Location: Right Arm, Patient Position: Sitting, Cuff Size: Normal)   Pulse 85   Ht '5\' 6"'$  (1.676 m) Comment: reported  Wt 190 lb 12.8 oz (86.5 kg)   LMP 10/28/2021   BMI 30.80 kg/m     General appearance: alert, cooperative and appears stated age   Assessment/Plan: 1. Irregular bleeding - will start oral progesterone and recheck 3-4 months - norethindrone (AYGESTIN) 5 MG tablet; Take 1 tablet (5 mg total) by mouth 2 (two) times daily.  Dispense: 60 tablet; Refill: 3  2. Right ovarian cyst - due to size of ovarian cyst, will recheck in 3-4 months - US PELVIS TRANSVAGINAL NON-OB (TV  ONLY); Future

## 2021-11-28 ENCOUNTER — Emergency Department (HOSPITAL_COMMUNITY): Payer: 59

## 2021-11-28 ENCOUNTER — Encounter (HOSPITAL_COMMUNITY): Payer: Self-pay

## 2021-11-28 ENCOUNTER — Emergency Department (HOSPITAL_COMMUNITY)
Admission: EM | Admit: 2021-11-28 | Discharge: 2021-11-28 | Disposition: A | Discharge status: Home / Self Care | Payer: 59 | Attending: Emergency Medicine | Admitting: Emergency Medicine

## 2021-11-28 DIAGNOSIS — R079 Chest pain, unspecified: Secondary | ICD-10-CM | POA: Insufficient documentation

## 2021-11-28 DIAGNOSIS — Z91040 Latex allergy status: Secondary | ICD-10-CM | POA: Insufficient documentation

## 2021-11-28 LAB — CBC
HCT: 37 % (ref 36.0–46.0)
Hemoglobin: 12.1 g/dL (ref 12.0–15.0)
MCH: 28.6 pg (ref 26.0–34.0)
MCHC: 32.7 g/dL (ref 30.0–36.0)
MCV: 87.5 fL (ref 80.0–100.0)
Platelets: 200 10*3/uL (ref 150–400)
RBC: 4.23 MIL/uL (ref 3.87–5.11)
RDW: 13.1 % (ref 11.5–15.5)
WBC: 6.4 10*3/uL (ref 4.0–10.5)
nRBC: 0 % (ref 0.0–0.2)

## 2021-11-28 LAB — BASIC METABOLIC PANEL
Anion gap: 8 (ref 5–15)
BUN: 11 mg/dL (ref 6–20)
CO2: 25 mmol/L (ref 22–32)
Calcium: 8.4 mg/dL — ABNORMAL LOW (ref 8.9–10.3)
Chloride: 106 mmol/L (ref 98–111)
Creatinine, Ser: 0.83 mg/dL (ref 0.44–1.00)
GFR, Estimated: >60 mL/min (ref >60)
Glucose, Bld: 109 mg/dL — ABNORMAL HIGH (ref 70–99)
Potassium: 3.3 mmol/L — ABNORMAL LOW (ref 3.5–5.1)
Sodium: 139 mmol/L (ref 135–145)

## 2021-11-28 LAB — TROPONIN I (HIGH SENSITIVITY)
Troponin I (High Sensitivity): 4 ng/L (ref <18)
Troponin I (High Sensitivity): 3 ng/L (ref <18)

## 2021-11-28 LAB — D-DIMER, QUANTITATIVE: D-Dimer, Quant: <0.27 ug/mL-FEU (ref 0.00–0.50)

## 2021-11-28 MED ORDER — SUCRALFATE 1 G PO TABS: 1.0000 g | ORAL_TABLET | Freq: Three times a day (TID) | ORAL | 0 refills | Status: DC

## 2021-11-28 MED ORDER — SUCRALFATE 1 G PO TABS
1.0000 g | ORAL_TABLET | Freq: Once | ORAL | Status: AC
  Administered 2021-11-28: 1 g via ORAL
  Filled 2021-11-28: qty 1

## 2021-11-28 MED ORDER — POTASSIUM CHLORIDE CRYS ER 20 MEQ PO TBCR
40.0000 meq | EXTENDED_RELEASE_TABLET | Freq: Once | ORAL | Status: AC
  Administered 2021-11-28: 40 meq via ORAL
  Filled 2021-11-28: qty 2

## 2021-11-28 MED ORDER — ALUM & MAG HYDROXIDE-SIMETH 200-200-20 MG/5ML PO SUSP
30.0000 mL | Freq: Once | ORAL | Status: AC
Start: 2021-11-28 — End: 2021-11-28
  Administered 2021-11-28: 30 mL via ORAL
  Filled 2021-11-28: qty 30

## 2021-11-28 NOTE — ED Provider Notes (Signed)
[facility] EMERGENCY DEPARTMENT Provider Note   CSN: [id number] Arrival date & time: 11/28/21  0705     History  Chief Complaint  Patient presents with   Chest Pain    Laurie Bell is a 41 y.o. female with history of chronic neck pain, anxiety, GERD being treated with famotidine and Protonix.  The patient presents to ED for evaluation of chest pain.  Patient reports that this morning at 5 AM she was woken from her sleep by chest pain located on the right side of her chest that radiates down underneath her right breast and into the right side of her abdomen.  The patient states that this pain has never occurred before.  Patient states that the pain comes and goes.  Patient describes the pain as a pressure.  Patient reports that she was given 2 nitroglycerin tablets and aspirin with EMS and these did not relieve her symptoms, only gave her a headache.  Patient denies history of blood clots, hormone use.  Patient also states that she has had subjective left-sided leg swelling the past 1 week however she states that today it is not present.  Patient arrives to ED wearing nasal cannula however EMS vital signs noted patient oxygen saturation 98% on room air.  Patient endorses chest pain, slight shortness of breath, 1 episode of diarrhea this morning, slight nausea, headache, neck pain.  Patient denies any lightheadedness, dizziness, weakness, numbness, abdominal pain, fever.   Chest Pain Associated symptoms: headache, nausea and shortness of breath   Associated symptoms: no abdominal pain, no dizziness, no numbness, no vomiting and no weakness        Home Medications Prior to Admission medications   Medication Sig Start Date End Date Taking? Authorizing Provider  sucralfate (CARAFATE) 1 g tablet Take 1 tablet (1 g total) by mouth 4 (four) times daily -  with meals and at bedtime. 11/28/21  Yes Azucena Cecil, PA-C  AMBULATORY NON FORMULARY MEDICATION 90 ml viscous  lidocaine, 90 ml '10mg'$ /5 ml Dicyclomine, 270 ml malox. Take 10 ml twice daily. 09/15/21   Sharyn Creamer, MD  Baclofen 5 MG TABS Take 5 mg by mouth 2 (two) times daily. 10/26/21   Sharyn Creamer, MD  diclofenac Sodium (VOLTAREN) 1 % GEL Apply 2 g topically 4 (four) times daily. 06/10/21   Rancour, Annie Main, MD  famotidine (PEPCID) 20 MG tablet Take 1 tablet (20 mg total) by mouth 2 (two) times daily. 10/26/21   Sharyn Creamer, MD  FLUoxetine (PROZAC) 40 MG capsule Take 1 capsule (40 mg total) by mouth daily. 08/18/21   Charlott Rakes, MD  fluticasone (FLONASE) 50 MCG/ACT nasal spray Place 2 sprays into both nostrils daily. 09/26/21   Charlott Rakes, MD  hydrOXYzine (ATARAX) 25 MG tablet Take 1 tablet (25 mg total) by mouth 3 (three) times daily. 09/26/21   Charlott Rakes, MD  lidocaine (LIDODERM) 5 % Place 1 patch onto the skin daily. 08/26/21   Charlott Rakes, MD  norethindrone (AYGESTIN) 5 MG tablet Take 1 tablet (5 mg total) by mouth 2 (two) times daily. 11/23/21   Megan Salon, MD  ondansetron (ZOFRAN-ODT) 4 MG disintegrating tablet Take 1 tablet (4 mg total) by mouth every 8 (eight) hours as needed for nausea or vomiting. 08/18/21   Sharyn Creamer, MD  pantoprazole (PROTONIX) 40 MG tablet Take 1 tablet (40 mg total) by mouth 2 (two) times daily before a meal. 10/12/21   Sharyn Creamer, MD  pregabalin (LYRICA) 75 MG capsule Take 1 capsule (75 mg total) by mouth 3 (three) times daily. 10/28/21   Kirsteins, Luanna Salk, MD  tiZANidine (ZANAFLEX) 2 MG tablet Take 1 tablet (2 mg total) by mouth every 6 (six) hours as needed for muscle spasms. 09/26/21   Charlott Rakes, MD  medroxyPROGESTERone (PROVERA) 10 MG tablet Take 2 tablets by mouth daily Patient not taking: Reported on 06/05/2019 02/06/18 02/02/20  Sloan Leiter, MD      Allergies    Latex    Review of Systems   Review of Systems  Respiratory:  Positive for shortness of breath.   Cardiovascular:  Positive for chest pain and leg swelling.   Gastrointestinal:  Positive for diarrhea and nausea. Negative for abdominal pain and vomiting.  Musculoskeletal:  Positive for neck pain.  Neurological:  Positive for headaches. Negative for dizziness, weakness, light-headedness and numbness.  All other systems reviewed and are negative.   Physical Exam Updated Vital Signs BP 107/81 (BP Location: Right Arm)   Pulse 76   Temp 98 F (36.7 C) (Oral)   Resp 16   LMP 10/28/2021   SpO2 98%  Physical Exam Constitutional:      General: She is not in acute distress.    Appearance: She is well-developed. She is not ill-appearing, toxic-appearing or diaphoretic.  HENT:     Head: Normocephalic and atraumatic.  Eyes:     Extraocular Movements: Extraocular movements intact.     Pupils: Pupils are equal, round, and reactive to light.  Neck:     Vascular: No JVD.  Cardiovascular:     Rate and Rhythm: Normal rate and regular rhythm.  Pulmonary:     Effort: Pulmonary effort is normal. No tachypnea or respiratory distress.     Breath sounds: Normal breath sounds. No decreased breath sounds, wheezing, rhonchi or rales.  Chest:     Chest wall: No tenderness.  Abdominal:     General: Bowel sounds are normal.     Palpations: Abdomen is soft.     Tenderness: There is no abdominal tenderness.  Musculoskeletal:     Cervical back: Normal range of motion and neck supple.     Right lower leg: No tenderness. No edema.     Left lower leg: No tenderness. No edema.  Lymphadenopathy:     Cervical: No cervical adenopathy.  Skin:    General: Skin is warm and dry.     Capillary Refill: Capillary refill takes less than 2 seconds.  Neurological:     Mental Status: She is alert and oriented to person, place, and time.     ED Results / Procedures / Treatments   Labs (all labs ordered are listed, but only abnormal results are displayed) Labs Reviewed  BASIC METABOLIC PANEL - Abnormal; Notable for the following components:      Result Value    Potassium 3.3 (*)    Glucose, Bld 109 (*)    Calcium 8.4 (*)    All other components within normal limits  CBC  D-DIMER, QUANTITATIVE  TROPONIN I (HIGH SENSITIVITY)  TROPONIN I (HIGH SENSITIVITY)    EKG None  Radiology DG Chest 1 View  Result Date: 11/28/2021 CLINICAL DATA:  170017 chest pain and shortness of breath since this a.m. EXAM: CHEST  1 VIEW COMPARISON:  June 09, 2021 FINDINGS: The heart size and mediastinal contours are within normal limits. Both lungs are clear. The visualized skeletal structures are unremarkable. IMPRESSION: No active disease. Electronically Signed  By: Frazier Richards M.D.   On: 11/28/2021 07:54    Procedures Procedures   Medications Ordered in ED Medications  sucralfate (CARAFATE) tablet 1 g (has no administration in time range)  alum & mag hydroxide-simeth (MAALOX/MYLANTA) 200-200-20 MG/5ML suspension 30 mL (30 mLs Oral Given 11/28/21 0802)  potassium chloride SA (KLOR-CON M) CR tablet 40 mEq (40 mEq Oral Given 11/28/21 5170)    ED Course/ Medical Decision Making/ A&P           HEART Score: 0                Medical Decision Making Amount and/or Complexity of Data Reviewed Labs: ordered. Radiology: ordered.  Risk OTC drugs. Prescription drug management.   41 year old female presents to the ED for evaluation.  Please see HPI for further details.  On examination, the patient is afebrile and nontachycardic.  The patient lung sounds are clear bilaterally, she is not hypoxic on room air.  The patient's abdomen is soft and compressible in all 4 quadrants.  Patient neurological examination shows no focal neurodeficits.  Patient worked up utilizing the following labs and imaging studies interpreted by me personally: - Chest x-ray shows no consolidation, effusion, mediastinal widening - BMP shows slightly decreased potassium at 3.3 treated with 40 mEq of potassium.  Patient glucose slightly elevated however noncontributory to symptoms.  Patient  calcium slightly decreased to 8.4, patient encouraged to replete calcium as an outpatient - Troponin initially 4, delta troponin 3 - CBC unremarkable - D-dimer negative, not elevated - EKG is nonischemic, does show some PVCs.  This EKG for some reason was not transmitted over into MUSE however me and my attending, Dr. Regenia Skeeter were able to evaluate the EKG.  Patient treated with 40 mEq potassium, GI cocktail, 1 g Carafate.  Patient states that after these medications were given she does feel slightly better.  Patient discharged home with Carafate prescription, advised to return to ED with any return of symptoms or worsening of symptoms.  Patient was given return precautions and she voiced understanding with these.  The patient had all of her questions answered to her satisfaction.  Patient encouraged to follow-up with PCP.  Patient stable for discharge home.  Final Clinical Impression(s) / ED Diagnoses Final diagnoses:  Chest pain, unspecified type    Rx / DC Orders ED Discharge Orders          Ordered    sucralfate (CARAFATE) 1 g tablet  3 times daily with meals & bedtime        11/28/21 1102              Azucena Cecil, [place] 11/28/21 [place], MD 11/29/21 [phone]

## 2021-11-28 NOTE — ED Triage Notes (Signed)
Pt [place] [facility] from home  c/o CP on the center radiate to back that started 5am this morning. Pt Has never experienced this before. 324 ASA and 2 nitro given by EMS which did not help to relieve the pain . No SOB, no N/V. Hx GERD. A&O X4. VSS.

## 2021-11-28 NOTE — Discharge Instructions (Addendum)
Return to the ED with any new symptoms such as chest pain, shortness of breath Please follow-up with your PCP for ongoing management Please read attached informational guide concerning chest wall pain Please pick up prescription for Carafate that I have sent in

## 2021-11-28 NOTE — ED Notes (Signed)
Patient transported to X-ray 

## 2021-12-06 ENCOUNTER — Encounter: Payer: 59 | Admitting: Physical Medicine & Rehabilitation

## 2021-12-06 ENCOUNTER — Telehealth: Payer: Self-pay

## 2021-12-06 DIAGNOSIS — M542 Cervicalgia: Secondary | ICD-10-CM

## 2021-12-06 NOTE — Telephone Encounter (Signed)
Patient called and stated the referral for the CT scan was not in the system. I looked and did not see the order in her chart. Please advise

## 2021-12-07 NOTE — Addendum Note (Signed)
Addended by: Casilda Carls on: 12/07/2021 10:01 AM   Modules accepted: Orders

## 2021-12-07 NOTE — Telephone Encounter (Signed)
Patient notified of Xray put in to system and she can go to [facility]

## 2021-12-09 ENCOUNTER — Encounter: Payer: Self-pay | Admitting: Internal Medicine

## 2021-12-09 ENCOUNTER — Ambulatory Visit (INDEPENDENT_AMBULATORY_CARE_PROVIDER_SITE_OTHER): Payer: 59 | Admitting: Licensed Clinical Social Worker

## 2021-12-09 ENCOUNTER — Telehealth: Payer: Self-pay | Admitting: Internal Medicine

## 2021-12-09 ENCOUNTER — Ambulatory Visit (INDEPENDENT_AMBULATORY_CARE_PROVIDER_SITE_OTHER): Payer: 59 | Admitting: Internal Medicine

## 2021-12-09 VITALS — BP 102/78 | HR 82 | Ht 66.0 in | Wt 195.2 lb

## 2021-12-09 DIAGNOSIS — E0590 Thyrotoxicosis, unspecified without thyrotoxic crisis or storm: Secondary | ICD-10-CM

## 2021-12-09 DIAGNOSIS — F331 Major depressive disorder, recurrent, moderate: Secondary | ICD-10-CM

## 2021-12-09 DIAGNOSIS — F411 Generalized anxiety disorder: Secondary | ICD-10-CM

## 2021-12-09 LAB — TSH: TSH: 0.27 u[IU]/mL — ABNORMAL LOW (ref 0.35–5.50)

## 2021-12-09 LAB — T4, FREE: Free T4: 0.83 ng/dL (ref 0.60–1.60)

## 2021-12-09 NOTE — Plan of Care (Signed)
  Problem: Anxiety Disorder CCP Problem  1 GAD  Goal:  Identify 3 trigger for anxiety  Outcome: Progressing Goal: STG: Patient will participate in at least 80% of scheduled individual psychotherapy sessions Outcome: Progressing Goal: STG: Patient will complete at least 80% of assigned homework Outcome: Progressing

## 2021-12-09 NOTE — Progress Notes (Unsigned)
Name: Laurie Bell  MRN/ DOB: [id number], July 14, 1980    Age/ Sex: 41 y.o., female    PCP: Charlott Rakes, MD   Reason for Endocrinology Evaluation: Low TSH     Date of Initial Endocrinology Evaluation: 12/09/2021     HPI: [name] is a 41 y.o. female with a past medical history of. The patient presented for initial endocrinology clinic visit on 12/09/2021 for consultative assistance with her low TSH.   Patient has been noted with low TSH since September 2012 with a TSH at 0.129u IU/mL, with normal free T4.   Weight has been trending up  She has not been seen by endocrinology in the past  Has occasional loose stools and diarrhea with occasional constipation  Has chronic palpitations  She was  evaluated by cardiology for palpitations in the past  She has chronic anxiety  Has local neck swelling and dysphagia  No Biotin intake     Mother and maternal aunt with thyroid disease     HISTORY:  Past Medical History:  Past Medical History:  Diagnosis Date   Anxiety    Arthritis    Chlamydia contact, treated    GERD (gastroesophageal reflux disease)    Past Surgical History:  Past Surgical History:  Procedure Laterality Date   ENDOMETRIAL ABLATION N/A 04/25/2017   Procedure: ENDOMETRIAL ABLATION With NOVASURE;  Surgeon: Woodroe Mode, MD;  Location: [facility];  Service: Gynecology;  Laterality: N/A;   ESOPHAGEAL MANOMETRY N/A 07/27/2021   Procedure: ESOPHAGEAL MANOMETRY (EM);  Surgeon: Sharyn Creamer, MD;  Location: [facility];  Service: Gastroenterology;  Laterality: N/A;   TUBAL LIGATION  2003    Social History:  reports that she has never smoked. She has never used smokeless tobacco. She reports that she does not currently use drugs after having used the following drugs: Marijuana. She reports that she does not drink alcohol. Family History: family history includes Cancer in her maternal grandmother; Hypertension in her maternal aunt  and mother.   HOME MEDICATIONS: Allergies as of 12/09/2021       Reactions   Latex Other (See Comments)   Burn in vaginal area with latex condoms        Medication List        Accurate as of December 09, 2021 12:43 PM. If you have any questions, ask your nurse or doctor.          AMBULATORY NON FORMULARY MEDICATION 90 ml viscous lidocaine, 90 ml '10mg'$ /5 ml Dicyclomine, 270 ml malox. Take 10 ml twice daily.   Baclofen 5 MG Tabs Take 5 mg by mouth 2 (two) times daily.   diclofenac Sodium 1 % Gel Commonly known as: Voltaren Apply 2 g topically 4 (four) times daily.   famotidine 20 MG tablet Commonly known as: Pepcid Take 1 tablet (20 mg total) by mouth 2 (two) times daily.   FLUoxetine 40 MG capsule Commonly known as: PROzac Take 1 capsule (40 mg total) by mouth daily. What changed: additional instructions   fluticasone 50 MCG/ACT nasal spray Commonly known as: FLONASE Place 2 sprays into both nostrils daily.   hydrOXYzine 25 MG tablet Commonly known as: ATARAX Take 1 tablet (25 mg total) by mouth 3 (three) times daily.   lidocaine 5 % Commonly known as: Lidoderm Place 1 patch onto the skin daily.   norethindrone 5 MG tablet Commonly known as: AYGESTIN Take 1 tablet (5 mg total) by mouth 2 (two) times daily.  ondansetron 4 MG disintegrating tablet Commonly known as: ZOFRAN-ODT Take 1 tablet (4 mg total) by mouth every 8 (eight) hours as needed for nausea or vomiting.   pantoprazole 40 MG tablet Commonly known as: PROTONIX Take 1 tablet (40 mg total) by mouth 2 (two) times daily before a meal.   pregabalin 75 MG capsule Commonly known as: LYRICA Take 1 capsule (75 mg total) by mouth 3 (three) times daily.   sucralfate 1 g tablet Commonly known as: Carafate Take 1 tablet (1 g total) by mouth 4 (four) times daily -  with meals and at bedtime.   tiZANidine 2 MG tablet Commonly known as: ZANAFLEX Take 1 tablet (2 mg total) by mouth every 6 (six) hours as  needed for muscle spasms.          REVIEW OF SYSTEMS: A comprehensive ROS was conducted with the patient and is negative except as per HPI and below:  ROS     OBJECTIVE:  VS: BP 102/78 (BP Location: Left Arm, Patient Position: Sitting, Cuff Size: Normal)   Pulse 82   Ht '5\' 6"'$  (1.676 m)   Wt 195 lb 3.2 oz (88.5 kg)   LMP 10/28/2021   SpO2 98%   BMI 31.51 kg/m    Wt Readings from Last 3 Encounters:  12/09/21 195 lb 3.2 oz (88.5 kg)  11/23/21 190 lb 12.8 oz (86.5 kg)  10/28/21 185 lb (83.9 kg)     EXAM: General: Pt appears well and is in NAD  Neck: General: Supple without adenopathy. Thyroid: Thyroid is prominent.  No goiter or nodules appreciated.   Lungs: Clear with good BS bilat with no rales, rhonchi, or wheezes  Heart: Auscultation: RRR.  Abdomen: Normoactive bowel sounds, soft, nontender, without masses or organomegaly palpable  Extremities:  BL LE: No pretibial edema normal ROM and strength.  Mental Status: Judgment, insight: Intact Orientation: Oriented to time, place, and person Mood and affect: No depression, anxiety, or agitation     DATA REVIEWED: ***    Latest Reference Range & Units 11/28/21 07:31  Sodium 135 - 145 mmol/L 139  Potassium 3.5 - 5.1 mmol/L 3.3 (L)  Chloride 98 - 111 mmol/L 106  CO2 22 - 32 mmol/L 25  Glucose 70 - 99 mg/dL 109 (H)  BUN 6 - 20 mg/dL 11  Creatinine 0.44 - 1.00 mg/dL 0.83  Calcium 8.9 - 10.3 mg/dL 8.4 (L)  Anion gap 5 - 15  8  GFR, Estimated >60 mL/min >60    ASSESSMENT/PLAN/RECOMMENDATIONS:   Subclinical Hyperthyroidism:   The causes of subclinical hyperthyroidism are the same as the causes of overt hyperthyroidism, and like overt hyperthyroidism, subclinical hyperthyroidism can be persistent or transient. Common causes of subclinical hyperthyroidism include, autonomously functioning thyroid adenomas and multinodular goiters , or Graves' disease .   - Most patients with subclinical hyperthyroidism have no  clinical manifestations of hyperthyroidism, and those symptoms that are present (eg, tachycardia, tremor, dyspnea on exertion, weight loss) are mild and nonspecific." However, subclinical hyperthyroidism is associated with an increased risk of atrial fibrillation and, primarily in postmenopausal women, a decrease in bone mineral density.  - Approximately 3% of healthy african americans have a serum TSH of < 0.4 uIU/mL due to a shift in the distribution of TSH values in normal people of african american descent.  -Patient with multiple symptoms that is exacerbated with abnormal TFTs -We discussed that Graves' Disease is a result of an autoimmune condition involving the thyroid.    We discussed  with pt the benefits of methimazole in the Tx of hyperthyroidism, as well as the possible side effects/complications of anti-thyroid drug Tx (specifically detailing the rare, but serious side effect of agranulocytosis). She was informed of need for regular thyroid function monitoring while on methimazole to ensure appropriate dosage without over-treatment. As well, we discussed the possible side effects of methimazole including the chance of rash, the small chance of liver irritation/juandice and the <=1 in 300-400 chance of sudden onset agranulocytosis.  We discussed importance of going to ED promptly (and stopping methimazole) if shewere to develop significant fever with severe sore throat of other evidence of acute infection.      We extensively discussed the various treatment options for hyperthyroidism and Graves disease including ablation therapy with radioactive iodine versus antithyroid drug treatment versus surgical therapy.  We recommended to the patient that we felt, at this time, that *** therapy would be most optimal.  We discussed the various possible benefits versus side effects of the various therapies.   I carefully explained to the patient that one of the consequences of I-131 ablation treatment  would likely be permanent hypothyroidism which would require long-term replacement therapy with LT4.    Medications :   Labs in 4 weeks Follow-up in 3 months  Signed electronically by: Mack Guise, MD  [facility] Endocrinology  [facility] [address] [place], [place] [address] Phone: [phone] FAX: [phone]   CC: Charlott Rakes, MD [place] [address] Phone: [phone] Fax: [phone]   Return to Endocrinology clinic as below: Future Appointments  Date Time Provider [place]  12/23/2021 10:00 AM Dory Horn, LCSW [facility] None  12/26/2021 10:50 AM Sharyn Creamer, MD [facility]  01/12/2022  8:00 AM Dory Horn, LCSW [facility] None  01/24/2022  1:30 PM Kirsteins, Luanna Salk, MD [facility] CPR  03/08/2022  8:50 AM Charlott Rakes, MD [facility] None  03/22/2022 10:50 AM Calix Heinbaugh, Melanie Crazier, MD [facility] None  03/28/2022  3:10 PM Charlott Rakes, MD [facility] None  03/29/2022  9:30 AM [facility] US OB 1 [facility]  03/29/2022 10:15 AM Megan Salon, MD [facility]

## 2021-12-09 NOTE — Patient Instructions (Signed)
I will start you on Methimazole if your thyroid is still off  Will repeat labs in 3-4 weeks     Thyroid ultrasound has been ordered

## 2021-12-09 NOTE — Progress Notes (Signed)
   THERAPIST PROGRESS NOTE  Virtual Visit via Video Note  I connected with Armonii N Gorton on 12/09/21 at 10:00 AM EDT by a video enabled telemedicine application and verified that I am speaking with the correct person using two identifiers.  Location: Patient: [facility]  Provider: Providers Home    I discussed the limitations of evaluation and management by telemedicine and the availability of in person appointments. The patient expressed understanding and agreed to proceed.      I discussed the assessment and treatment plan with the patient. The patient was provided an opportunity to ask questions and all were answered. The patient agreed with the plan and demonstrated an understanding of the instructions.   The patient was advised to call back or seek an in-person evaluation if the symptoms worsen or if the condition fails to improve as anticipated.  I provided 40 minutes of non-face-to-face time during this encounter.   Dory Horn, LCSW   Participation Level: Active  Behavioral Response: CasualAlertAnxious  Type of Therapy: Individual Therapy  Treatment Goals addressed: Identify 3 trigger for anxiety       ProgressTowards Goals: Progressing  Interventions: CBT, Motivational Interviewing, and Supportive    Suicidal/Homicidal: Nowithout intent/plan  Therapist Response:     Pt was alert and oriented x 5. She was dressed causally and engaged well in therapy session. Pt presented with depressed and anxious mood/affect. She was pleasant, cooperative, and maintained good eye contact.   Pt reports primary stressors as illness. Pt reports that she has a spot in her color bone that has been hurting. Pt reports that she had a family member have a similar pain and it turned out to be cancer. She reports that she has had multiple CT scans on different parts of her body but does not understand that the full extent of the tests. Pt reports that her illness has been  causing her increased anxiety and panic attack. They have got so bad that she went to the ED for chest pain but was having a panic attack.   Interventions/Plan: LCSW provided pt education on "5,4,3,2,1 grounding technique". PDF was emailed to her in session. LCSW went over the technique live in session. LCSW spoke with pt about communication with providers to clarify tests being conducted. LCSW advocated for pt to speak with the provider directly to alleviate anxiety symptoms about potential illness.   Plan: Return again in 4 weeks.  Diagnosis: MDD (major depressive disorder), recurrent episode, moderate (HCC)  GAD (generalized anxiety disorder)  Collaboration of Care: Other None today   Patient/Guardian was advised Release of Information must be obtained prior to any record release in order to collaborate their care with an outside provider. Patient/Guardian was advised if they have not already done so to contact the registration department to sign all necessary forms in order for [place] to release information regarding their care.   Consent: Patient/Guardian gives verbal consent for treatment and assignment of benefits for services provided during this visit. Patient/Guardian expressed understanding and agreed to proceed.   Dory Horn, LCSW 12/09/2021

## 2021-12-09 NOTE — Telephone Encounter (Signed)
Patient notified and will contact them for scheduling

## 2021-12-09 NOTE — Telephone Encounter (Signed)
Patient stopped at front desk after labs to advise that [facility] had called to her about the Thyroid US. They are not in network for her insurance. Patient requesting US Thyroid be sent to [facility] Radiology

## 2021-12-11 LAB — TRAB (TSH RECEPTOR BINDING ANTIBODY): TRAB: <1 IU/L (ref <=2.00)

## 2021-12-11 LAB — T3: T3, Total: 116 ng/dL (ref 76–181)

## 2021-12-13 MED ORDER — METHIMAZOLE 5 MG PO TABS
5.0000 mg | ORAL_TABLET | Freq: Every day | ORAL | 4 refills | Status: DC
Start: 2021-12-13 — End: 2022-03-22

## 2021-12-14 NOTE — Telephone Encounter (Signed)
na

## 2021-12-23 ENCOUNTER — Encounter (HOSPITAL_COMMUNITY): Payer: Self-pay

## 2021-12-23 ENCOUNTER — Telehealth (HOSPITAL_COMMUNITY): Payer: Self-pay | Admitting: Licensed Clinical Social Worker

## 2021-12-23 ENCOUNTER — Ambulatory Visit (HOSPITAL_COMMUNITY)
Admission: RE | Admit: 2021-12-23 | Discharge: 2021-12-23 | Disposition: A | Discharge status: Home / Self Care | Payer: Commercial Managed Care - HMO | Source: Ambulatory Visit | Attending: Internal Medicine | Admitting: Internal Medicine

## 2021-12-23 ENCOUNTER — Ambulatory Visit (HOSPITAL_COMMUNITY): Payer: Commercial Managed Care - HMO | Admitting: Licensed Clinical Social Worker

## 2021-12-23 DIAGNOSIS — E0590 Thyrotoxicosis, unspecified without thyrotoxic crisis or storm: Secondary | ICD-10-CM | POA: Diagnosis not present

## 2021-12-23 NOTE — Telephone Encounter (Signed)
LCSW sent two virtual links to pt phone listed in epic with no response. LCSW f/u with PC and VM was full. LCSW stayed in virtual link until 1015 before disconnecting

## 2021-12-26 ENCOUNTER — Encounter: Payer: Self-pay | Admitting: Internal Medicine

## 2021-12-26 ENCOUNTER — Telehealth: Payer: Self-pay

## 2021-12-26 ENCOUNTER — Ambulatory Visit (INDEPENDENT_AMBULATORY_CARE_PROVIDER_SITE_OTHER): Payer: Commercial Managed Care - HMO | Admitting: Internal Medicine

## 2021-12-26 VITALS — BP 110/80 | HR 79 | Ht 66.25 in | Wt 196.4 lb

## 2021-12-26 DIAGNOSIS — K219 Gastro-esophageal reflux disease without esophagitis: Secondary | ICD-10-CM | POA: Diagnosis not present

## 2021-12-26 DIAGNOSIS — R0789 Other chest pain: Secondary | ICD-10-CM | POA: Diagnosis not present

## 2021-12-26 DIAGNOSIS — R1310 Dysphagia, unspecified: Secondary | ICD-10-CM | POA: Diagnosis not present

## 2021-12-26 DIAGNOSIS — R109 Unspecified abdominal pain: Secondary | ICD-10-CM | POA: Diagnosis not present

## 2021-12-26 DIAGNOSIS — E041 Nontoxic single thyroid nodule: Secondary | ICD-10-CM

## 2021-12-26 DIAGNOSIS — E0590 Thyrotoxicosis, unspecified without thyrotoxic crisis or storm: Secondary | ICD-10-CM

## 2021-12-26 NOTE — Progress Notes (Signed)
Chief Complaint: GERD  HPI : 41 year old female with history of GERD, anxiety, and athritis presents for follow up of GERD  Interval History: Patient states that the famotidine has helped slightly with the sensation of acid reflux. She continues to take her PPI BID and baclofen. She is still having some breakthrough reflux symptoms. She continues to have chest pain as well as SOB with exertion. She was recently started on methimazole for hyperthyroidism while she is undergoing evaluation of her thyroid. Her abdominal pain has improved slightly. She has not tried the Miralax trial yet because she is not always able to regularly use the bathroom at work to have a BM.  Wt Readings from Last 3 Encounters:  12/26/21 196 lb 6 oz (89.1 kg)  12/09/21 195 lb 3.2 oz (88.5 kg)  11/23/21 190 lb 12.8 oz (86.5 kg)    Current Outpatient Medications  Medication Sig Dispense Refill   AMBULATORY NON FORMULARY MEDICATION 90 ml viscous lidocaine, 90 ml '10mg'$ /5 ml Dicyclomine, 270 ml malox. Take 10 ml twice daily. 450 mL 0   Baclofen 5 MG TABS Take 5 mg by mouth 2 (two) times daily. 90 tablet 2   diclofenac Sodium (VOLTAREN) 1 % GEL Apply 2 g topically 4 (four) times daily. 100 g 0   famotidine (PEPCID) 20 MG tablet Take 1 tablet (20 mg total) by mouth 2 (two) times daily. 30 tablet 3   FLUoxetine (PROZAC) 40 MG capsule Take 1 capsule (40 mg total) by mouth daily. (Patient taking differently: Take 40 mg by mouth daily. As needed) 30 capsule 3   fluticasone (FLONASE) 50 MCG/ACT nasal spray Place 2 sprays into both nostrils daily. 16 g 6   hydrOXYzine (ATARAX) 25 MG tablet Take 1 tablet (25 mg total) by mouth 3 (three) times daily. 60 tablet 3   lidocaine (LIDODERM) 5 % Place 1 patch onto the skin daily. 30 patch 3   methimazole (TAPAZOLE) 5 MG tablet Take 1 tablet (5 mg total) by mouth daily. 30 tablet 4   norethindrone (AYGESTIN) 5 MG tablet Take 1 tablet (5 mg total) by mouth 2 (two) times daily. 60 tablet 3    ondansetron (ZOFRAN-ODT) 4 MG disintegrating tablet Take 1 tablet (4 mg total) by mouth every 8 (eight) hours as needed for nausea or vomiting. 20 tablet 0   pantoprazole (PROTONIX) 40 MG tablet Take 1 tablet (40 mg total) by mouth 2 (two) times daily before a meal. 120 tablet 1   pregabalin (LYRICA) 75 MG capsule Take 1 capsule (75 mg total) by mouth 3 (three) times daily. 90 capsule 1   sucralfate (CARAFATE) 1 g tablet Take 1 tablet (1 g total) by mouth 4 (four) times daily -  with meals and at bedtime. 30 tablet 0   tiZANidine (ZANAFLEX) 2 MG tablet Take 1 tablet (2 mg total) by mouth every 6 (six) hours as needed for muscle spasms. 60 tablet 6   No current facility-administered medications for this visit.   Review of Systems: All systems reviewed and negative except where noted in HPI.   Physical Exam: BP 110/80 (BP Location: Left Arm, Patient Position: Sitting, Cuff Size: Normal)   Pulse 79   Ht 5' 6.25" (1.683 m) Comment: height measured without shoes  Wt 196 lb 6 oz (89.1 kg)   LMP 12/23/2021   BMI 31.46 kg/m  Constitutional: Pleasant,well-developed, female in no acute distress. HEENT: Normocephalic and atraumatic. Conjunctivae are normal. No scleral icterus. Cardiovascular: Normal rate, occasional irregular  beats Pulmonary/chest: Effort normal and breath sounds normal. No wheezing, rales or rhonchi. Abdominal: Soft, nondistended, nontender. Bowel sounds active throughout. There are no masses palpable. No hepatomegaly. Extremities: No edema Neurological: Alert and oriented to person place and time. Skin: Skin is warm and dry. No rashes noted. Psychiatric: Normal mood and affect. Behavior is normal.  Labs 05/2021: Lipase nml. LFTs nml. CBC and BMP nml. TSH mildly low. FT4 and FT3 nml.  Labs 06/2021: Vit D nml, Vit B12 nml. HCV Ab negative.  CT renal stone study 12/20/16: IMPRESSION: 1. No acute intra-abdominal or pelvic pathology. Specifically there is no hydronephrosis or  nephrolithiasis. 2. Moderate colonic stool burden. No bowel obstruction or active inflammation. Normal appendix. 3. Prominent uterus with poorly visualized fibroid.  Barium swallow 08/26/20: IMPRESSION: Moderate gastroesophageal reflux Negative for peptic ulcer disease. 3 mm calcification to the left of L2-3 disc space is new since yesterday. Question symptoms of left renal colic  TTE 10/27/59: IMPRESSIONS   1. Left ventricular ejection fraction, by estimation, is 65 to 70%. The  left ventricle has normal function. The left ventricle has no regional  wall motion abnormalities. Left ventricular diastolic parameters were  normal.   2. Right ventricular systolic function is normal. The right ventricular  size is normal.   3. The mitral valve is normal in structure. Trivial mitral valve  regurgitation.   4. The aortic valve is normal in structure. Aortic valve regurgitation is  not visualized.  CT A/P w/contrast 09/25/21: IMPRESSION: 1. Known uterine fibroid that appears grossly stable in size better evaluated on pelvic ultrasound 10/08/2016. 2. Hepatomegaly. 3. Constipation. 4. Otherwise no acute intra-abdominal or intrapelvic abnormality.  Speech therapy evaluation 05/27/21: Pt presents with a functional dysphagia that has a significant negative impact on her life - she avoids eating in restaurants; she limits her diet to a narrow selection of foods; she has lost weight.  During today's study, Ms. Langenbach self-limited bolus sizes (solid foods ~1/2 tspn; liquids ~ 5 ml) and required encouragement to consume >10 ml sips of liquid or tablespoon sized solid boluses.  She verbalized her concerns about increasing bolus size and became tearful and apologetic and discussed her fears related to swallowing.  Offered encouragement.  Imaging revealed normal biomechanics of the swallow with thorough mastication, timely swallow response, normal pharyngeal squeeze, reliable laryngeal vestibule closure,  no penetration/aspiration, no obvious stasis in esophagus upon brief screen.  Ms. Oetken identifed globus sensation during the study. We watched the study together in real time; we discussed the anatomical separation of the airway from the esophagus and discussed that the feeling of pressure when eating is related to her esophagus, but that her breathing is not compromised.  She was encouraged to record a few frames of the video as affirmation of the normal mechanics of her swallowing.  We discussed recommendations, and Ms. Micco agreed she might benefit from OP SLP to address the functional nature of her dysphagia. Recommend referral to [facility] neuro OP clinic to address the aforementioned condition.  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 - Non-bleeding gastric ulcers with a clean ulcer base (Forrest Class III). Biopsied. - Normal examined duodenum. - Biopsies were taken with a cold forceps for histology in the proximal esophagus and in the distal esophagus. Path: 1. Surgical [P], gastric antrum and gastric body - GASTRIC ANTRAL MUCOSA WITH MILD NONSPECIFIC REACTIVE GASTROPATHY - GASTRIC OXYNTIC MUCOSA WITH NO SPECIFIC HISTOPATHOLOGIC CHANGES - WARTHIN STARRY STAIN IS NEGATIVE FOR HELICOBACTER PYLORI 2. Surgical [P],  distal esophagus - ESOPHAGEAL SQUAMOUS AND CARDIAC MUCOSA WITH REFLUX-ASSOCIATED CHANGES - NEGATIVE FOR INTESTINAL METAPLASIA OR DYSPLASIA 3. 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 - Normal stomach. - Normal examined duodenum. - No specimens collected.  Esophageal manometry 07/27/21: Normal.  ASSESSMENT AND PLAN: GERD Dysphagia Abdominal pain Zenker's diverticulum Patient has seen some benefit from the implemented therapies for  GERD but continues to have some breakthrough symptoms. Constipation may be contributing to slowed gastric emptying so will have her try starting Miralax QD to see if this helps with her symptoms. At this time I do wonder if there may be an alternative explanation for her symptoms as well so will reach out to her cardiologist Dr. Irish Lack to see if he has any additional suggestions about the patient's ongoing atypical chest pain and DOE and whether or not she would benefit from treatment for her underlying PVCs. Patient is actively undergoing evaluation of her thyroid and was recently started on methimazole therapy. Her thyroid ultrasound does appear to show a thyroid nodule that would likely benefit from further biopsy. It is possible that this thyroid nodule may be contributing to the patient's sensation of dysphagia.  - GERD handout - Continue pantoprazole 40 mg BID - Cont baclofen 5 mg BID  - Cont Pepcid 20 mg BID PRN - Start Miralax daily and fiber daily for a week - Cont Zofran - GI cocktail PRN - Will touch base with her cardiologist Dr. Irish Lack due to concerns about ongoing chest discomfort and DOE to see if she would benefit from treatment for her PVCs - RTC 3 months  Christia Reading, MD  I spent 41 minutes of time, including in depth chart review, independent review of results as outlined above, communicating results with the patient directly, face-to-face time with the patient, coordinating care, ordering studies and medications as appropriate, and documentation.

## 2021-12-26 NOTE — Telephone Encounter (Signed)
Patient calling for US results

## 2021-12-26 NOTE — Telephone Encounter (Signed)
Discussed thyroid ultrasound results with the patient on 12/26/2021 with right superior 1.5 cm nodule meeting FNA criteria     Patient in agreement of this  All questions have been answered   An order for an FNA has been placed at [place], MD  [facility]  [facility] [address], [place] [address] Phone: [phone] FAX: [phone]

## 2021-12-26 NOTE — Patient Instructions (Signed)
If you are age 42 or older, your body mass index should be between 23-30. Your Body mass index is 31.46 kg/m. If this is out of the aforementioned range listed, please consider follow up with your Primary Care Provider.  If you are age 94 or younger, your body mass index should be between 19-25. Your Body mass index is 31.46 kg/m. If this is out of the aformentioned range listed, please consider follow up with your Primary Care Provider.   The [facility] providers would like to encourage you to use [facility] to communicate with providers for non-urgent requests or questions.  Due to long hold times on the telephone, sending your provider a message by [facility] may be a faster and more efficient way to get a response.  Please allow 48 business hours for a response.  Please remember that this is for non-urgent requests.  Start Miralax and a fiber supplement daily.  Thank you for entrusting me with your care and for choosing [facility], Dr. Christia Reading

## 2021-12-28 ENCOUNTER — Telehealth: Payer: Self-pay | Admitting: *Deleted

## 2021-12-28 ENCOUNTER — Ambulatory Visit (INDEPENDENT_AMBULATORY_CARE_PROVIDER_SITE_OTHER): Payer: Commercial Managed Care - HMO

## 2021-12-28 ENCOUNTER — Telehealth: Payer: Self-pay | Admitting: Emergency Medicine

## 2021-12-28 ENCOUNTER — Encounter: Payer: Self-pay | Admitting: *Deleted

## 2021-12-28 DIAGNOSIS — I493 Ventricular premature depolarization: Secondary | ICD-10-CM

## 2021-12-28 NOTE — Telephone Encounter (Signed)
Pt is requesting imaging for her shoulder.

## 2021-12-28 NOTE — Telephone Encounter (Signed)
-----   Message from Sharyn Creamer, MD sent at 12/27/2021  9:12 AM EDT ----- Sounds good thank you ----- Message ----- From: Jettie Booze, MD Sent: 12/27/2021   9:07 AM EDT To: Thompson Grayer, RN; Sharyn Creamer, MD  WE could do a 2 week Zio patch to quantify her PVCs and see if they are frequent enough to require medication.   JV ----- Message ----- From: Sharyn Creamer, MD Sent: 12/26/2021  12:34 PM EDT To: Jettie Booze, MD  Hi Dr. Irish Lack, I just wanted to touch base about this patient [name]. You have seen her for PVCs and atypical chest pain in the past. I have been treating her GERD and some symptoms of dysphagia. Today on exam I do still hear that she has frequent PVCs. Certainly her atypical chest pain may be related to MSK issues, but she did mention DOE on exertion to me today. I do wonder if an arrhythmia may be contributing to her DOE and chest pain. I would appreciate your insight and whether or not she may benefit from treatment for her PVCs. If so, then would you be able a follow up clinic appt with you? Thanks.

## 2021-12-28 NOTE — Progress Notes (Unsigned)
Enrolled patient for a 14 day ZIo XT monitor to be mailed to patients home  

## 2021-12-28 NOTE — Telephone Encounter (Signed)
Patient notified.  She is aware monitor will be mailed to her.  Will send instructions to her through my chart

## 2021-12-28 NOTE — Telephone Encounter (Signed)
Copied from [facility] [phone]. Topic: General - Other >> Dec 28, 2021  9:47 AM Shiquita J wrote: Reason for CRM: pt called in to be advised by her PCP.  Pt says that she was referred to PT for knot on shoulder. Pt says that the medication and PT isn't helping. Pt would like to know if provider could do imaging? Pt would like to know how to proceed.   [phone] -please advise

## 2021-12-30 ENCOUNTER — Encounter (HOSPITAL_COMMUNITY): Payer: Self-pay

## 2021-12-30 ENCOUNTER — Ambulatory Visit (HOSPITAL_COMMUNITY)
Admission: EM | Admit: 2021-12-30 | Discharge: 2021-12-30 | Disposition: A | Discharge status: Home / Self Care | Payer: 59 | Attending: Nurse Practitioner | Admitting: Nurse Practitioner

## 2021-12-30 DIAGNOSIS — J019 Acute sinusitis, unspecified: Secondary | ICD-10-CM | POA: Diagnosis not present

## 2021-12-30 DIAGNOSIS — M542 Cervicalgia: Secondary | ICD-10-CM

## 2021-12-30 DIAGNOSIS — R0981 Nasal congestion: Secondary | ICD-10-CM

## 2021-12-30 DIAGNOSIS — J302 Other seasonal allergic rhinitis: Secondary | ICD-10-CM

## 2021-12-30 MED ORDER — CHLORPHEN-PE-ACETAMINOPHEN 4-10-325 MG PO TABS: 1.0000 | ORAL_TABLET | Freq: Three times a day (TID) | ORAL | 0 refills | Status: AC

## 2021-12-30 MED ORDER — METHYLPREDNISOLONE 4 MG PO TBPK: ORAL_TABLET | ORAL | 0 refills | Status: DC

## 2021-12-30 MED ORDER — FLUTICASONE PROPIONATE 50 MCG/ACT NA SUSP
2.0000 | Freq: Every day | NASAL | 0 refills | Status: DC
Start: 2021-12-30 — End: 2023-02-17

## 2021-12-30 NOTE — Discharge Instructions (Signed)
Take medications as prescribed.  The steroids will help your sinuses as well as your neck pain.  Continue the medicines that you already have for your neck pain.  Unfortunately, there is not anything else that I can assist you with regarding the neck issues as this has been ongoing since November of last year.  Please call your healthcare provider on Monday to inquire about your thyroid biopsy and additional issues regarding your neck.

## 2021-12-30 NOTE — ED Provider Notes (Signed)
[place]    CSN: [id number] Arrival date & time: 12/30/21  1721      History   Chief Complaint Chief Complaint  Patient presents with   Cough   Nasal Congestion   Neck Pain    HPI Laurie Bell is a 41 y.o. female.   History of Present Illness   Laurie Bell is a 41 y.o. female that presents complaining of left neck pain without radiation. Onset of symptoms was 8 months ago. Patient denies any injury and reports that "it just started hurting."Patient describes the pain as aching. Symptoms have been fluctuating. Symptoms are aggravated by movement and palpation, alleviated by nothing. There is no associated with tingling, numbness, or weakness. She has been seen by her PCP and pain management provider as well as have underwent physical therapy without any resolution of her symptoms. Care prior to arrival consisted of rest, NSAID, acetaminophen, heat, Voltaren gel, Lyrica and Zanaflex with minimal relief. Patient reports that she called her PCP about this a couple of days ago and is still awaiting a call back. She reports that she was told to come to the urgent care if she didn't hear anything back from the office.   Laurie Bell is also seeking evaluation of possible sinus infection. Symptoms include congestion, post nasal drip, and sore throat with no fever, chills, night sweats or weight loss. Onset of symptoms was 3 days ago and stable since that time.  She is drinking plenty of fluids.  She has not tried anything for her symptoms. Past history is significant for no history of pneumonia or bronchitis. Patient is a non-smoker.  The following portions of the patient's history were reviewed and updated as appropriate: allergies, current medications, past family history, past medical history, past social history, past surgical history, and problem list.       Past Medical History:  Diagnosis Date   Anxiety    Arthritis    Chlamydia contact, treated    GERD  (gastroesophageal reflux disease)    Hyperthyroidism     Patient Active Problem List   Diagnosis Date Noted   MDD (major depressive disorder), recurrent episode, moderate ([place]) 11/03/2021   GAD (generalized anxiety disorder) 11/03/2021   Neck pain 10/28/2021   Cervical high risk HPV (human papillomavirus) test positive 10/17/2021   Irregular heart rhythm 04/15/2021   Numbness on left side 04/15/2021   Dysphagia 03/17/2021   Hypomagnesemia 02/23/2021   Gastroesophageal reflux disease    Hypokalemia 08/24/2020   Atypical chest pain 08/24/2020   Nausea and vomiting 08/24/2020   Hypocalcemia 08/24/2020   DUB (dysfunctional uterine bleeding) 01/12/2017   Fibroid, uterine 01/12/2017    Past Surgical History:  Procedure Laterality Date   ENDOMETRIAL ABLATION N/A 04/25/2017   Procedure: ENDOMETRIAL ABLATION With NOVASURE;  Surgeon: Woodroe Mode, MD;  Location: [facility];  Service: Gynecology;  Laterality: N/A;   ESOPHAGEAL MANOMETRY N/A 07/27/2021   Procedure: ESOPHAGEAL MANOMETRY (EM);  Surgeon: Sharyn Creamer, MD;  Location: [facility];  Service: Gastroenterology;  Laterality: N/A;   TUBAL LIGATION  2003    OB History     Gravida  2   Para  2   Term  2   Preterm  0   AB  0   Living         SAB  0   IAB  0   Ectopic  0   Multiple      Live Births  Home Medications    Prior to Admission medications   Medication Sig Start Date End Date Taking? Authorizing Provider  Chlorphen-PE-Acetaminophen 4-10-325 MG TABS Take 1 tablet by mouth 3 (three) times daily for 7 days. 12/30/21 01/06/22 Yes Enrique Sack, FNP  fluticasone (FLONASE) 50 MCG/ACT nasal spray Place 2 sprays into both nostrils daily. 12/30/21  Yes Enrique Sack, FNP  methylPREDNISolone (MEDROL DOSEPAK) 4 MG TBPK tablet Take as directed 12/30/21  Yes Zaakirah Kistner, Aldona Bar, FNP  AMBULATORY NON FORMULARY MEDICATION 90 ml viscous lidocaine, 90 ml '10mg'$ /5 ml Dicyclomine,  270 ml malox. Take 10 ml twice daily. 09/15/21   Sharyn Creamer, MD  Baclofen 5 MG TABS Take 5 mg by mouth 2 (two) times daily. 10/26/21   Sharyn Creamer, MD  diclofenac Sodium (VOLTAREN) 1 % GEL Apply 2 g topically 4 (four) times daily. 06/10/21   Rancour, Annie Main, MD  famotidine (PEPCID) 20 MG tablet Take 1 tablet (20 mg total) by mouth 2 (two) times daily. 10/26/21   Sharyn Creamer, MD  FLUoxetine (PROZAC) 40 MG capsule Take 1 capsule (40 mg total) by mouth daily. Patient taking differently: Take 40 mg by mouth daily. As needed 08/18/21   Charlott Rakes, MD  hydrOXYzine (ATARAX) 25 MG tablet Take 1 tablet (25 mg total) by mouth 3 (three) times daily. 09/26/21   Charlott Rakes, MD  lidocaine (LIDODERM) 5 % Place 1 patch onto the skin daily. 08/26/21   Charlott Rakes, MD  methimazole (TAPAZOLE) 5 MG tablet Take 1 tablet (5 mg total) by mouth daily. 12/13/21   Shamleffer, Melanie Crazier, MD  norethindrone (AYGESTIN) 5 MG tablet Take 1 tablet (5 mg total) by mouth 2 (two) times daily. 11/23/21   Megan Salon, MD  ondansetron (ZOFRAN-ODT) 4 MG disintegrating tablet Take 1 tablet (4 mg total) by mouth every 8 (eight) hours as needed for nausea or vomiting. 08/18/21   Sharyn Creamer, MD  pantoprazole (PROTONIX) 40 MG tablet Take 1 tablet (40 mg total) by mouth 2 (two) times daily before a meal. 10/12/21   Sharyn Creamer, MD  pregabalin (LYRICA) 75 MG capsule Take 1 capsule (75 mg total) by mouth 3 (three) times daily. 10/28/21   Kirsteins, Luanna Salk, MD  sucralfate (CARAFATE) 1 g tablet Take 1 tablet (1 g total) by mouth 4 (four) times daily -  with meals and at bedtime. 11/28/21   Azucena Cecil, PA-C  tiZANidine (ZANAFLEX) 2 MG tablet Take 1 tablet (2 mg total) by mouth every 6 (six) hours as needed for muscle spasms. 09/26/21   Charlott Rakes, MD  medroxyPROGESTERone (PROVERA) 10 MG tablet Take 2 tablets by mouth daily Patient not taking: Reported on 06/05/2019 02/06/18 02/02/20  Sloan Leiter, MD     Family History Family History  Problem Relation Age of Onset   Hypertension Mother    Hypertension Maternal Aunt    Cancer Maternal Grandmother        type unknown   Esophageal cancer Neg Hx    Rectal cancer Neg Hx    Stomach cancer Neg Hx    Colon cancer Neg Hx     Social History Social History   Tobacco Use   Smoking status: Never   Smokeless tobacco: Never  Vaping Use   Vaping Use: Never used  Substance Use Topics   Alcohol use: No   Drug use: Not Currently    Types: Marijuana    Comment: quit 2018     Allergies   Latex  Review of Systems Review of Systems  Constitutional:  Negative for chills, fatigue and fever.  HENT:  Positive for congestion, postnasal drip, rhinorrhea and sore throat.   Respiratory:  Positive for cough. Negative for shortness of breath.   Gastrointestinal:  Negative for nausea and vomiting.  Musculoskeletal:  Positive for neck pain. Negative for back pain, myalgias and neck stiffness.  Neurological:  Negative for dizziness and headaches.  All other systems reviewed and are negative.    Physical Exam Triage Vital Signs ED Triage Vitals [12/30/21 1753]  Enc Vitals Group     BP 121/86     Pulse Rate 100     Resp 16     Temp 98.6 F (37 C)     Temp Source Oral     SpO2 97 %     Weight      Height      Head Circumference      Peak Flow      Pain Score      Pain Loc      Pain Edu?      Excl. in [place]?    No data found.  Updated Vital Signs BP 121/86 (BP Location: Left Arm)   Pulse 100   Temp 98.6 F (37 C) (Oral)   Resp 16   LMP 12/23/2021   SpO2 97%   Visual Acuity Right Eye Distance:   Left Eye Distance:   Bilateral Distance:    Right Eye Near:   Left Eye Near:    Bilateral Near:     Physical Exam Vitals reviewed.  Constitutional:      General: She is not in acute distress.    Appearance: Normal appearance. She is not toxic-appearing.  HENT:     Head: Normocephalic.     Nose: Congestion present.      Mouth/Throat:     Mouth: Mucous membranes are moist.  Eyes:     Conjunctiva/sclera: Conjunctivae normal.     Pupils: Pupils are equal, round, and reactive to light.  Neck:     Trachea: Trachea and phonation normal.  Cardiovascular:     Rate and Rhythm: Normal rate.  Pulmonary:     Effort: Pulmonary effort is normal.  Musculoskeletal:        General: Normal range of motion.     Cervical back: Normal range of motion and neck supple. Tenderness present. No edema or rigidity. Muscular tenderness present. No spinous process tenderness. Normal range of motion.  Lymphadenopathy:     Cervical: No cervical adenopathy.  Skin:    General: Skin is warm and dry.  Neurological:     General: No focal deficit present.     Mental Status: She is alert and oriented to person, place, and time.     Sensory: No sensory deficit.  Psychiatric:        Mood and Affect: Mood normal.        Behavior: Behavior normal.      UC Treatments / Results  Labs (all labs ordered are listed, but only abnormal results are displayed) Labs Reviewed - No data to display  EKG   Radiology No results found.  Procedures Procedures (including critical care time)  Medications Ordered in UC Medications - No data to display  Initial Impression / Assessment and Plan / UC Course  I have reviewed the triage vital signs and the nursing notes.  Pertinent labs & imaging results that were available during my care of the patient were reviewed by  me and considered in my medical decision making (see chart for details).     41 year old female presenting with left neck pain that has been ongoing for the past 8 months.  She has been seen by her PCP and pain management provider as well as underwent physical therapy without any resolution of symptoms.  Pain has been waxing and waning since onset.  No reported injury.  She has tried NSAIDs, and acetaminophen, heat, Voltaren gel, Lyrica and Zanaflex with minimal relief.  Patient  also presents with a 2-day history of nasal congestion, postnasal drainage and sore throat with no fevers, chills, sweats or myalgias.  She has not tried anything for her symptoms.  Patient is currently afebrile.  Nontoxic.  No focal deficits noted on exam.  Will prescribe a Medrol Dosepak which will help both her neck and sinus/allergy symptoms.  Additionally decongestant and Flonase also prescribed.  Patient advised to continue the prescribed and supportive care therapies for her neck pain.  Encourage patient to call her PCP back on Monday regarding this ongoing issue.  Today's evaluation has revealed no signs of a dangerous process. Discussed diagnosis with patient and/or guardian. Patient and/or guardian aware of their diagnosis, possible red flag symptoms to watch out for and need for close follow up. Patient and/or guardian understands verbal and written discharge instructions. Patient and/or guardian comfortable with plan and disposition.  Patient and/or guardian has a clear mental status at this time, good insight into illness (after discussion and teaching) and has clear judgment to make decisions regarding their care  Documentation was completed with the aid of voice recognition software. Transcription may contain typographical errors. Final Clinical Impressions(s) / UC Diagnoses   Final diagnoses:  Cervicalgia  Acute non-recurrent sinusitis, unspecified location  Nasal congestion     Discharge Instructions      Take medications as prescribed.  The steroids will help your sinuses as well as your neck pain.  Continue the medicines that you already have for your neck pain.  Unfortunately, there is not anything else that I can assist you with regarding the neck issues as this has been ongoing since November of last year.  Please call your healthcare provider on Monday to inquire about your thyroid biopsy and additional issues regarding your neck.       ED Prescriptions     Medication  Sig Dispense Auth. Provider   methylPREDNISolone (MEDROL DOSEPAK) 4 MG TBPK tablet Take as directed 21 tablet Enrique Sack, FNP   Chlorphen-PE-Acetaminophen 4-10-325 MG TABS Take 1 tablet by mouth 3 (three) times daily for 7 days. 21 tablet Irl Bodie, Aldona Bar, FNP   fluticasone (FLONASE) 50 MCG/ACT nasal spray Place 2 sprays into both nostrils daily. 16 g Enrique Sack, FNP      PDMP not reviewed this encounter.   Enrique Sack, [place] 12/30/21 1925

## 2021-12-30 NOTE — ED Triage Notes (Signed)
Pt reports left neck pain radiating down to patient arm. Pt reports cough and nasal congestion x 2 days.

## 2022-01-01 DIAGNOSIS — I493 Ventricular premature depolarization: Secondary | ICD-10-CM | POA: Diagnosis not present

## 2022-01-09 ENCOUNTER — Telehealth: Payer: Self-pay

## 2022-01-09 NOTE — Telephone Encounter (Signed)
Patient called in wants to know if she has to have biopsy and if there is anything else she can do before having it done?

## 2022-01-09 NOTE — Telephone Encounter (Signed)
Yes, reviewing her thyroid ultrasound, one of the nodules definitely met criteria for biopsy.  She does not need to do anything else.  She will be called to schedule this, if she has not been already.

## 2022-01-10 ENCOUNTER — Other Ambulatory Visit (HOSPITAL_COMMUNITY): Payer: Self-pay

## 2022-01-10 ENCOUNTER — Telehealth: Payer: Self-pay | Admitting: Pharmacy Technician

## 2022-01-10 NOTE — Telephone Encounter (Addendum)
Patient Advocate Encounter  Prior Authorization for PANTOPRAZOLE '40MG'$  has been approved.    PA# [id number] AND [id number] Effective dates: 8.1.23 through 7.31.24  Patriciann Becht B. CPhT P: [phone] F: [phone]  Received notification from [facility] that prior authorization for PANTOPRAZOLE '40MG'$  is required.   PA submitted on 8.1.23 Key [id number] Status is pending    Luciano Cutter, CPhT Patient Advocate Phone: [phone]

## 2022-01-11 ENCOUNTER — Other Ambulatory Visit: Payer: Self-pay | Admitting: Internal Medicine

## 2022-01-11 DIAGNOSIS — E0590 Thyrotoxicosis, unspecified without thyrotoxic crisis or storm: Secondary | ICD-10-CM

## 2022-01-11 NOTE — Telephone Encounter (Signed)
Error

## 2022-01-11 NOTE — Telephone Encounter (Signed)
Laurie Bell, Please let her know that these are not known side effect of methimazole, however, they may be seen in patients with developing hypothyroidism.  Lets have her back for labs to make sure she is not developing this.  Labs are in.

## 2022-01-11 NOTE — Telephone Encounter (Signed)
Called patient and she has been scheduled for labs this Friday.

## 2022-01-12 ENCOUNTER — Ambulatory Visit (INDEPENDENT_AMBULATORY_CARE_PROVIDER_SITE_OTHER): Payer: 59 | Admitting: Licensed Clinical Social Worker

## 2022-01-12 DIAGNOSIS — F331 Major depressive disorder, recurrent, moderate: Secondary | ICD-10-CM | POA: Diagnosis not present

## 2022-01-12 DIAGNOSIS — F411 Generalized anxiety disorder: Secondary | ICD-10-CM

## 2022-01-12 NOTE — Progress Notes (Signed)
   THERAPIST PROGRESS NOTE  Virtual Visit via Video Note  I connected with Laurie Bell on 01/12/22 at  8:00 AM EDT by a video enabled telemedicine application and verified that I am speaking with the correct person using two identifiers.  Location: Patient: [facility]  Provider: Providers Home    I discussed the limitations of evaluation and management by telemedicine and the availability of in person appointments. The patient expressed understanding and agreed to proceed.      I discussed the assessment and treatment plan with the patient. The patient was provided an opportunity to ask questions and all were answered. The patient agreed with the plan and demonstrated an understanding of the instructions.   The patient was advised to call back or seek an in-person evaluation if the symptoms worsen or if the condition fails to improve as anticipated.  I provided 30 minutes of non-face-to-face time during this encounter.   Dory Horn, LCSW   Participation Level: Active  Behavioral Response: CasualAlertAnxious and Depressed  Type of Therapy: Individual Therapy  Treatment Goals addressed:   ProgressTowards Goals: Progressing  Interventions: Motivational Interviewing    Suicidal/Homicidal: Nowithout intent/plan  Therapist Response:    Pt was alert and oriented x 5. Pt was casually and engaged well in therapy session. Pt presented with depressed and anxious mood/affect. She was pleasant, cooperative and maintained good eye contact.   Pt primary stressor is illness. Pt reports getting a biopsy for abnormal nodules. Pt endorses symptoms for tension, worry, irritability, change in energy, and insomnia. Pt reports good support system with her children and fianc. [name] states that she has been utilizing coping skills for reading, volleyball, and journaling.   Intervention/Plan: LCSW used CBT therapy in today's session about addressing how we view problems instead  of addressing the problems themselves. LCSW spoked with pt about reframing problems into positives instead of negatives. LCSW used supportive therapy for normalization, praise, and encouragement. LCSW educated pt about continue use of positive coping skills such as reading, journaling, and exercise. LCSW administered PHQ-9 and GAD-7 and review scores with pt.   Plan: Return again in 3 weeks.  Diagnosis: MDD (major depressive disorder), recurrent episode, moderate ([facility])  GAD (generalized anxiety disorder)  Collaboration of Care: Other none today   Patient/Guardian was advised Release of Information must be obtained prior to any record release in order to collaborate their care with an outside provider. Patient/Guardian was advised if they have not already done so to contact the registration department to sign all necessary forms in order for [place] to release information regarding their care.   Consent: Patient/Guardian gives verbal consent for treatment and assignment of benefits for services provided during this visit. Patient/Guardian expressed understanding and agreed to proceed.   Dory Horn, LCSW 01/12/2022

## 2022-01-13 ENCOUNTER — Other Ambulatory Visit (INDEPENDENT_AMBULATORY_CARE_PROVIDER_SITE_OTHER): Payer: Commercial Managed Care - HMO

## 2022-01-13 ENCOUNTER — Other Ambulatory Visit (HOSPITAL_COMMUNITY): Payer: Self-pay

## 2022-01-13 DIAGNOSIS — E0590 Thyrotoxicosis, unspecified without thyrotoxic crisis or storm: Secondary | ICD-10-CM

## 2022-01-13 LAB — TSH: TSH: 0.5 u[IU]/mL (ref 0.35–5.50)

## 2022-01-13 LAB — T4, FREE: Free T4: 0.77 ng/dL (ref 0.60–1.60)

## 2022-01-13 LAB — T3, FREE: T3, Free: 3.3 pg/mL (ref 2.3–4.2)

## 2022-01-18 ENCOUNTER — Encounter: Payer: Self-pay | Admitting: Family Medicine

## 2022-01-18 ENCOUNTER — Ambulatory Visit: Payer: Commercial Managed Care - HMO | Attending: Family Medicine | Admitting: Family Medicine

## 2022-01-18 VITALS — BP 117/72 | HR 94 | Temp 98.3°F | Ht 66.0 in | Wt 202.2 lb

## 2022-01-18 DIAGNOSIS — E876 Hypokalemia: Secondary | ICD-10-CM | POA: Diagnosis not present

## 2022-01-18 DIAGNOSIS — Z131 Encounter for screening for diabetes mellitus: Secondary | ICD-10-CM | POA: Diagnosis not present

## 2022-01-18 DIAGNOSIS — M5412 Radiculopathy, cervical region: Secondary | ICD-10-CM | POA: Diagnosis not present
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
## 2022-01-18 NOTE — Patient Instructions (Signed)
Cervical Radiculopathy  Cervical radiculopathy means that a nerve in the neck (a cervical nerve) is pinched or bruised. This can happen because of an injury to the cervical spine (vertebrae) in the neck, or as a normal part of getting older. This condition can cause pain or loss of feeling (numbness) that runs from your neck all the way down to your arm and fingers. Often, this condition gets better with rest. Treatment may be needed if the condition does not get better. What are the causes? A neck injury. A bulging disk in your spine. Sudden muscle tightening (muscle spasms). Tight muscles in your neck due to overuse. Arthritis. Breakdown in the bones and joints of the spine (spondylosis) due to getting older. Bone spurs that form near the nerves in the neck. What are the signs or symptoms? Pain. The pain may: Run from the neck to the arm and hand. Be very bad or irritating. Get worse when you move your neck. Loss of feeling or tingling in your arm or hand. Weakness in your arm or hand, in very bad cases. How is this treated? In many cases, treatment is not needed for this condition. With rest, the condition often gets better over time. If treatment is needed, options may include: Wearing a soft neck collar (cervical collar) for short periods of time. Doing exercises (physical therapy) to strengthen your neck muscles. Taking medicines. Having shots (injections) in your spine, in very bad cases. Having surgery. This may be needed if other treatments do not help. The type of surgery that is used will depend on the cause of your condition. Follow these instructions at home: If you have a soft neck collar: Wear it as told by your doctor. Take it off only as told by your doctor. Ask your doctor if you can take the collar off for cleaning and bathing. If you are allowed to take the collar off for cleaning or bathing: Follow instructions from your doctor about how to take off the collar  safely. Clean the collar by wiping it with mild soap and water and drying it completely. Take out any removable pads in the collar every 1-2 days. Wash them by hand with soap and water. Let them air-dry completely before you put them back in the collar. Check your skin under the collar for redness or sores. If you see any, tell your doctor. Managing pain     Take over-the-counter and prescription medicines only as told by your doctor. If told, put ice on the painful area. To do this: If you have a soft neck collar, take if off as told by your doctor. Put ice in a plastic bag. Place a towel between your skin and the bag. Leave the ice on for 20 minutes, 2-3 times a day. Take off the ice if your skin turns bright red. This is very important. If you cannot feel pain, heat, or cold, you have a greater risk of damage to the area. If using ice does not help, you can try using heat. Use the heat source that your doctor recommends, such as a moist heat pack or a heating pad. Place a towel between your skin and the heat source. Leave the heat on for 20-30 minutes. Take off the heat if your skin turns bright red. This is very important. If you cannot feel pain, heat, or cold, you have a greater risk of getting burned. You may try a gentle neck and shoulder rub (massage). Activity Rest as needed. Return   to your normal activities when your doctor says that it is safe. Do exercises as told by your doctor or physical therapist. You may have to avoid lifting. Ask your doctor how much you can safely lift. General instructions Use a flat pillow when you sleep. Do not drive while wearing a soft neck collar. If you do not have a soft neck collar, ask your doctor if it is safe to drive while your neck heals. Ask your doctor if you should avoid driving or using machines while you are taking your medicine. Do not smoke or use any products that contain nicotine or tobacco. If you need help quitting, ask your  doctor. Keep all follow-up visits. Contact a doctor if: Your condition does not get better with treatment. Get help right away if: Your pain gets worse and medicine does not help. You lose feeling or feel weak in your hand, arm, face, or leg. You have a high fever. Your neck is stiff. You cannot control when you poop or pee (have incontinence). You have trouble with walking, balance, or talking. Summary Cervical radiculopathy means that a nerve in the neck is pinched or bruised. A nerve can get pinched from a bulging disk, arthritis, an injury to the neck, or other causes. Symptoms include pain, tingling, or loss of feeling that goes from the neck to the arm or hand. Weakness in your arm or hand can happen in very bad cases. Treatment may include resting, wearing a soft neck collar, and doing exercises. You might need to take medicines for pain. In very bad cases, shots or surgery may be needed. This information is not intended to replace advice given to you by your health care provider. Make sure you discuss any questions you have with your health care provider. Document Revised: 12/02/2020 Document Reviewed: 12/02/2020 Elsevier Patient Education  2023 Elsevier Inc.  

## 2022-01-19 LAB — BASIC METABOLIC PANEL
BUN/Creatinine Ratio: 12 (ref 9–23)
BUN: 9 mg/dL (ref 6–24)
CO2: 23 mmol/L (ref 20–29)
Calcium: 9.4 mg/dL (ref 8.7–10.2)
Chloride: 101 mmol/L (ref 96–106)
Creatinine, Ser: 0.75 mg/dL (ref 0.57–1.00)
Glucose: 76 mg/dL (ref 70–99)
Potassium: 3.5 mmol/L (ref 3.5–5.2)
Sodium: 138 mmol/L (ref 134–144)
eGFR: 103 mL/min/{1.73_m2} (ref >59)

## 2022-01-19 LAB — HEMOGLOBIN A1C
Est. average glucose Bld gHb Est-mCnc: 114 mg/dL
Hgb A1c MFr Bld: 5.6 % (ref 4.8–5.6)

## 2022-01-20 ENCOUNTER — Telehealth: Payer: Self-pay | Admitting: Physical Medicine & Rehabilitation

## 2022-01-20 ENCOUNTER — Ambulatory Visit
Admission: RE | Admit: 2022-01-20 | Discharge: 2022-01-20 | Disposition: A | Discharge status: Home / Self Care | Payer: Commercial Managed Care - HMO | Source: Ambulatory Visit | Attending: Physical Medicine & Rehabilitation | Admitting: Physical Medicine & Rehabilitation

## 2022-01-20 DIAGNOSIS — M542 Cervicalgia: Secondary | ICD-10-CM

## 2022-01-20 NOTE — Telephone Encounter (Signed)
Patient went to have xray done and the order has expired.  Can Dr. Letta Pate order another xray for patient?  He told her at last visit to do it at [place].

## 2022-01-23 ENCOUNTER — Ambulatory Visit (HOSPITAL_COMMUNITY): Admission: RE | Admit: 2022-01-23 | Payer: Commercial Managed Care - HMO | Source: Ambulatory Visit

## 2022-01-23 ENCOUNTER — Telehealth: Payer: Self-pay

## 2022-01-23 NOTE — Telephone Encounter (Signed)
Patient would like to know what her other options would be before she decides not to do the biopsy.

## 2022-01-23 NOTE — Telephone Encounter (Signed)
Patient is scheduled to have a biopsy at 12:30pm and would like to have a order put to be sedated doing the procedure. Patient states that she has really bad anxiety and was told to contact us to put orders in.

## 2022-01-23 NOTE — Telephone Encounter (Signed)
Patient advise and will discuss other options at appointment in October

## 2022-01-24 ENCOUNTER — Encounter
Payer: Commercial Managed Care - HMO | Attending: Physical Medicine & Rehabilitation | Admitting: Physical Medicine & Rehabilitation

## 2022-01-24 ENCOUNTER — Encounter: Payer: Self-pay | Admitting: Physical Medicine & Rehabilitation

## 2022-01-24 VITALS — BP 134/86 | HR 89 | Ht 66.0 in | Wt 199.4 lb

## 2022-01-24 DIAGNOSIS — M5412 Radiculopathy, cervical region: Secondary | ICD-10-CM | POA: Diagnosis present

## 2022-01-24 MED ORDER — CYCLOBENZAPRINE HCL 10 MG PO TABS: 10.0000 mg | ORAL_TABLET | Freq: Every day | ORAL | 0 refills | Status: DC

## 2022-01-24 NOTE — Progress Notes (Signed)
Subjective:    Patient ID: Laurie Bell, female    DOB: 12-18-1980, 41 y.o.   MRN: [id number]  HPI 41 year old female with history of chronic neck pain of approximately 1 year duration.  She has some pain going into the left upper extremity with some numbness and tingling.  She has in the past complained of facial numbness as well as left lower extremity numbness MRI of the brain showed no lesions to explain her symptoms.  She has had an ED visit last month for neck pain.  X-rays at that time showed preserved disc spaces straightening of cervical lordosis, no significant spondylosis.  She does not have any significant weakness of the left arm but does have continued numbness and tingling. Pain Inventory Average Pain 8 Pain Right Now 8 My pain is sharp, burning, dull, stabbing, tingling, and aching  In the last 24 hours, has pain interfered with the following? General activity 5 Relation with others 6 Enjoyment of life 7 What TIME of day is your pain at its worst? morning , evening, and night Sleep (in general) Fair  Pain is worse with: walking, bending, standing, and some activites Pain improves with: medication Relief from Meds: 5  Family History  Problem Relation Age of Onset   Hypertension Mother    Hypertension Maternal Aunt    Cancer Maternal Grandmother        type unknown   Esophageal cancer Neg Hx    Rectal cancer Neg Hx    Stomach cancer Neg Hx    Colon cancer Neg Hx    Social History   Socioeconomic History   Marital status: Single    Spouse name: Not on file   Number of children: 2   Years of education: Not on file   Highest education level: Not on file  Occupational History   Occupation: Best boy: [employer]  Tobacco Use   Smoking status: Never   Smokeless tobacco: Never  Vaping Use   Vaping Use: Never used  Substance and Sexual Activity   Alcohol use: No   Drug use: Not Currently    Types: Marijuana    Comment: quit 2018   Sexual  activity: Not Currently    Birth control/protection: Surgical  Other Topics Concern   Not on file  Social History Narrative   ** Merged History Encounter **       Right Handed  Lives in a one story home    Social Determinants of Health   Financial Resource Strain: Medium Risk (11/03/2021)   Overall Financial Resource Strain (CARDIA)    Difficulty of Paying Living Expenses: Somewhat hard  Food Insecurity: No Food Insecurity (06/16/2021)   Hunger Vital Sign    Worried About Running Out of Food in the Last Year: Never true    Ran Out of Food in the Last Year: Never true  Transportation Needs: No Transportation Needs (11/03/2021)   PRAPARE - Hydrologist (Medical): No    Lack of Transportation (Non-Medical): No  Physical Activity: Sufficiently Active (11/03/2021)   Exercise Vital Sign    Days of Exercise per Week: 3 days    Minutes of Exercise per Session: 50 min  Stress: Stress Concern Present (11/03/2021)   [place]    Feeling of Stress : Very much  Social Connections: Moderately Isolated (11/03/2021)   Social Connection and Isolation Panel [NHANES]    Frequency of Communication  with Friends and Family: Once a week    Frequency of Social Gatherings with Friends and Family: Once a week    Attends Religious Services: More than 4 times per year    Active Member of Clubs or Organizations: No    Attends Archivist Meetings: Never    Marital Status: Living with partner   Past Surgical History:  Procedure Laterality Date   ENDOMETRIAL ABLATION N/A 04/25/2017   Procedure: ENDOMETRIAL ABLATION With [place];  Surgeon: Woodroe Mode, MD;  Location: [place];  Service: Gynecology;  Laterality: N/A;   ESOPHAGEAL MANOMETRY N/A 07/27/2021   Procedure: ESOPHAGEAL MANOMETRY (EM);  Surgeon: Sharyn Creamer, MD;  Location: [facility];  Service: Gastroenterology;  Laterality:  N/A;   TUBAL LIGATION  2003   Past Surgical History:  Procedure Laterality Date   ENDOMETRIAL ABLATION N/A 04/25/2017   Procedure: ENDOMETRIAL ABLATION With NOVASURE;  Surgeon: Woodroe Mode, MD;  Location: [place];  Service: Gynecology;  Laterality: N/A;   ESOPHAGEAL MANOMETRY N/A 07/27/2021   Procedure: ESOPHAGEAL MANOMETRY (EM);  Surgeon: Sharyn Creamer, MD;  Location: [facility];  Service: Gastroenterology;  Laterality: N/A;   TUBAL LIGATION  2003   Past Medical History:  Diagnosis Date   Anxiety    Arthritis    Chlamydia contact, treated    GERD (gastroesophageal reflux disease)    Hyperthyroidism    BP 134/86   Pulse 89   Ht '5\' 6"'$  (1.676 m)   Wt 199 lb 6.4 oz (90.4 kg)   LMP 12/23/2021   SpO2 98%   BMI 32.18 kg/m   Opioid Risk Score:   Fall Risk Score:  `1  Depression screen PHQ 2/9     01/24/2022    1:48 PM 01/18/2022    2:37 PM 01/12/2022    8:31 AM 11/23/2021    2:21 PM 11/03/2021    9:14 AM 10/28/2021    2:41 PM 09/26/2021    3:45 PM  Depression screen PHQ 2/9  Decreased Interest 0 0  0  1 2  Down, Depressed, Hopeless 0 0  0  1 1  PHQ - 2 Score 0 0  0  2 3  Altered sleeping  0     3  Tired, decreased energy  1     3  Change in appetite  0     2  Feeling bad or failure about yourself   0     0  Trouble concentrating  0     1  Moving slowly or fidgety/restless  0     0  Suicidal thoughts  0     0  PHQ-9 Score  1     12  Difficult doing work/chores            Information is confidential and restricted. Go to Review Flowsheets to unlock data.     Review of Systems  Constitutional: Negative.   HENT: Negative.    Eyes: Negative.   Respiratory: Negative.    Cardiovascular: Negative.   Gastrointestinal: Negative.   Endocrine: Negative.   Genitourinary: Negative.   Musculoskeletal: Negative.   Skin: Negative.   Allergic/Immunologic: Negative.   Neurological: Negative.   Hematological: Negative.   Psychiatric/Behavioral: Negative.         Objective:   Physical Exam Motor strength is 5/5 bilateral deltoid bicep tricep grip There is tenderness palpation in the left upper trapezius left levator scapula as well as left infraspinatus region.  Shoulder exam negative impingement test no evidence of joint deformity. Left upper extremity has full range of motion there is no joint swelling in the fingers Normal sensation to light touch in the hands although patient feels the left side is tingly.       Assessment & Plan:   1.  Chronic left sided neck pain with left upper extremity numbness tingling negative MRI of the brain, she complains of moderate to severe discomfort.  X-rays show straightening of cervical lordosis consistent with muscle spasm.  Given that her symptoms have not improved with physical therapy as well as medication management, will order MRI of the cervical spine without contrast.  Patient will follow-up in approximate 6 weeks In regards to muscle spasm this positive bothers her mainly at night she works 2 jobs and needs to be alert during the day.  We will order cyclobenzaprine 5 to 10 mg nightly

## 2022-01-27 ENCOUNTER — Ambulatory Visit (HOSPITAL_COMMUNITY): Payer: Commercial Managed Care - HMO | Admitting: Licensed Clinical Social Worker

## 2022-01-27 ENCOUNTER — Ambulatory Visit: Payer: Commercial Managed Care - HMO | Admitting: Surgery

## 2022-01-27 ENCOUNTER — Encounter (HOSPITAL_COMMUNITY): Payer: Self-pay

## 2022-01-27 ENCOUNTER — Telehealth (HOSPITAL_COMMUNITY): Payer: Self-pay | Admitting: Licensed Clinical Social Worker

## 2022-01-27 ENCOUNTER — Telehealth: Payer: Self-pay

## 2022-01-27 NOTE — Telephone Encounter (Signed)
LCSW sent three links to pt phone with no response. LCSW waited until 1115 before disconnecting

## 2022-01-27 NOTE — Telephone Encounter (Signed)
Tried calling patient to cancel her appointment for today per Benjiman Core, due to being seen by another provider and having other imaging scheduled for this issue. Patient is to F/U with other provider after her scan is complete.

## 2022-02-03 ENCOUNTER — Telehealth: Payer: Self-pay | Admitting: Internal Medicine

## 2022-02-03 ENCOUNTER — Other Ambulatory Visit: Payer: Self-pay | Admitting: Internal Medicine

## 2022-02-03 DIAGNOSIS — K219 Gastro-esophageal reflux disease without esophagitis: Secondary | ICD-10-CM

## 2022-02-03 MED ORDER — PANTOPRAZOLE SODIUM 40 MG PO TBEC: 40.0000 mg | DELAYED_RELEASE_TABLET | Freq: Two times a day (BID) | ORAL | 0 refills | Status: DC

## 2022-02-03 NOTE — Telephone Encounter (Signed)
Rx sent 

## 2022-02-03 NOTE — Telephone Encounter (Signed)
Patient called, states he run out of her pantoprazole 40 mg medication. Requesting medication refill. Please call to advise further.

## 2022-02-04 ENCOUNTER — Emergency Department (HOSPITAL_COMMUNITY)
Admission: EM | Admit: 2022-02-04 | Discharge: 2022-02-04 | Disposition: A | Discharge status: Home / Self Care | Payer: Commercial Managed Care - HMO | Attending: Emergency Medicine | Admitting: Emergency Medicine

## 2022-02-04 ENCOUNTER — Other Ambulatory Visit: Payer: Self-pay

## 2022-02-04 ENCOUNTER — Emergency Department (HOSPITAL_COMMUNITY): Payer: Commercial Managed Care - HMO

## 2022-02-04 ENCOUNTER — Encounter (HOSPITAL_COMMUNITY): Payer: Self-pay

## 2022-02-04 DIAGNOSIS — Z20822 Contact with and (suspected) exposure to covid-19: Secondary | ICD-10-CM | POA: Insufficient documentation

## 2022-02-04 DIAGNOSIS — N3000 Acute cystitis without hematuria: Secondary | ICD-10-CM

## 2022-02-04 DIAGNOSIS — R002 Palpitations: Secondary | ICD-10-CM | POA: Diagnosis present

## 2022-02-04 DIAGNOSIS — K122 Cellulitis and abscess of mouth: Secondary | ICD-10-CM | POA: Insufficient documentation

## 2022-02-04 DIAGNOSIS — Z91040 Latex allergy status: Secondary | ICD-10-CM | POA: Diagnosis not present

## 2022-02-04 DIAGNOSIS — I493 Ventricular premature depolarization: Secondary | ICD-10-CM | POA: Diagnosis not present

## 2022-02-04 DIAGNOSIS — E876 Hypokalemia: Secondary | ICD-10-CM | POA: Diagnosis not present

## 2022-02-04 LAB — URINALYSIS, ROUTINE W REFLEX MICROSCOPIC
Bilirubin Urine: NEGATIVE
Glucose, UA: NEGATIVE mg/dL
Ketones, ur: NEGATIVE mg/dL
Nitrite: POSITIVE — AB
Protein, ur: NEGATIVE mg/dL
Specific Gravity, Urine: 1.016 (ref 1.005–1.030)
WBC, UA: >50 WBC/hpf — ABNORMAL HIGH (ref 0–5)
pH: 6 (ref 5.0–8.0)

## 2022-02-04 LAB — CBC
HCT: 37.8 % (ref 36.0–46.0)
Hemoglobin: 12.8 g/dL (ref 12.0–15.0)
MCH: 29 pg (ref 26.0–34.0)
MCHC: 33.9 g/dL (ref 30.0–36.0)
MCV: 85.5 fL (ref 80.0–100.0)
Platelets: 244 K/uL (ref 150–400)
RBC: 4.42 MIL/uL (ref 3.87–5.11)
RDW: 12.9 % (ref 11.5–15.5)
WBC: 9.7 K/uL (ref 4.0–10.5)
nRBC: 0 % (ref 0.0–0.2)

## 2022-02-04 LAB — COMPREHENSIVE METABOLIC PANEL
ALT: 12 U/L (ref 0–44)
AST: 14 U/L — ABNORMAL LOW (ref 15–41)
Albumin: 3.7 g/dL (ref 3.5–5.0)
Alkaline Phosphatase: 95 U/L (ref 38–126)
Anion gap: 6 (ref 5–15)
BUN: 13 mg/dL (ref 6–20)
CO2: 24 mmol/L (ref 22–32)
Calcium: 8.8 mg/dL — ABNORMAL LOW (ref 8.9–10.3)
Chloride: 108 mmol/L (ref 98–111)
Creatinine, Ser: 0.83 mg/dL (ref 0.44–1.00)
GFR, Estimated: >60 mL/min (ref >60)
Glucose, Bld: 94 mg/dL (ref 70–99)
Potassium: 3 mmol/L — ABNORMAL LOW (ref 3.5–5.1)
Sodium: 138 mmol/L (ref 135–145)
Total Bilirubin: 0.4 mg/dL (ref 0.3–1.2)
Total Protein: 7.1 g/dL (ref 6.5–8.1)

## 2022-02-04 LAB — PREGNANCY, URINE: Preg Test, Ur: NEGATIVE

## 2022-02-04 LAB — TROPONIN I (HIGH SENSITIVITY)
Troponin I (High Sensitivity): <2 ng/L (ref <18)
Troponin I (High Sensitivity): <2 ng/L (ref <18)

## 2022-02-04 LAB — I-STAT BETA HCG BLOOD, ED (MC, WL, AP ONLY): I-stat hCG, quantitative: <5 m[IU]/mL

## 2022-02-04 LAB — SARS CORONAVIRUS 2 BY RT PCR: SARS Coronavirus 2 by RT PCR: NEGATIVE

## 2022-02-04 MED ORDER — POTASSIUM CHLORIDE 20 MEQ PO PACK
40.0000 meq | PACK | Freq: Once | ORAL | Status: AC
  Administered 2022-02-04: 40 meq via ORAL
  Filled 2022-02-04: qty 2

## 2022-02-04 MED ORDER — CEPHALEXIN 500 MG PO CAPS
500.0000 mg | ORAL_CAPSULE | Freq: Once | ORAL | Status: AC
  Administered 2022-02-04: 500 mg via ORAL
  Filled 2022-02-04: qty 1

## 2022-02-04 MED ORDER — POTASSIUM CHLORIDE ER 10 MEQ PO TBCR: 10.0000 meq | EXTENDED_RELEASE_TABLET | Freq: Every day | ORAL | 0 refills | Status: DC

## 2022-02-04 MED ORDER — CEPHALEXIN 500 MG PO CAPS: 500.0000 mg | ORAL_CAPSULE | Freq: Four times a day (QID) | ORAL | 0 refills | Status: DC

## 2022-02-04 MED ORDER — DEXAMETHASONE 1 MG/ML PO CONC
4.0000 mg | Freq: Once | ORAL | Status: AC
  Administered 2022-02-04: 4 mg via ORAL
  Filled 2022-02-04 (×2): qty 4

## 2022-02-04 MED ORDER — POTASSIUM CHLORIDE CRYS ER 20 MEQ PO TBCR
40.0000 meq | EXTENDED_RELEASE_TABLET | Freq: Once | ORAL | Status: DC
Start: 2022-02-04 — End: 2022-02-04

## 2022-02-04 NOTE — ED Provider Notes (Signed)
[facility] DEPT Provider Note   CSN: [id number] Arrival date & time: 02/04/22  0351     History  Chief Complaint  Patient presents with   Chest Pain    Laurie Bell is a 41 y.o. female.  Patient is a 41 year old female with a past medical history of GERD presenting to the emergency department with palpitations and shortness of breath.  Patient states over the last 2 days she has felt like her heart has been beating irregularly and has had associated shortness of breath.  She states that she feels short of breath on exertion and while lying flat at night.  She states that she has increased mucus drainage in her throat.  She states she has associated sore throat and feels like her throat is swollen.  She states that she has had a mild nonproductive cough.  She denies any fevers or chills.  She states she had nausea yesterday that has now resolved and denies any vomiting.  She states she has had an occasional loose stools.  She states that she has had mild diffuse abdominal discomfort.  She denies any lower extremity swelling, recent hospitalizations or surgery, history of blood clots, recent long travels in the car plane, hormone use or cancer history.  The history is provided by the patient.  Chest Pain      Home Medications Prior to Admission medications   Medication Sig Start Date End Date Taking? Authorizing Provider  acetaminophen (TYLENOL) 500 MG tablet Take 1,000 mg by mouth every 6 (six) hours as needed for moderate pain.    [provider]  AMBULATORY NON FORMULARY MEDICATION 90 ml viscous lidocaine, 90 ml '10mg'$ /5 ml Dicyclomine, 270 ml malox. Take 10 ml twice daily. Patient taking differently: 90 ml viscous lidocaine, 90 ml '10mg'$ /5 ml Dicyclomine, 270 ml malox. Take 10 ml twice daily as needed for heartburn 09/15/21   Sharyn Creamer, MD  ASHWAGANDHA PO Take 2 capsules by mouth 2 (two) times daily as needed (anxiety).    [provider]  cyclobenzaprine (FLEXERIL) 10 MG tablet Take 1 tablet (10 mg total) by mouth at bedtime. 01/24/22   Kirsteins, Luanna Salk, MD  diclofenac Sodium (VOLTAREN) 1 % GEL Apply 2 g topically 4 (four) times daily. 06/10/21   Rancour, Annie Main, MD  famotidine (PEPCID) 20 MG tablet Take 1 tablet (20 mg total) by mouth 2 (two) times daily. 10/26/21   Sharyn Creamer, MD  FLUoxetine (PROZAC) 40 MG capsule Take 1 capsule (40 mg total) by mouth daily. Patient taking differently: Take 40 mg by mouth daily as needed (depression). 08/18/21   Charlott Rakes, MD  fluticasone (FLONASE) 50 MCG/ACT nasal spray Place 2 sprays into both nostrils daily. Patient taking differently: Place 2 sprays into both nostrils daily as needed for allergies. 12/30/21   Enrique Sack, FNP  hydrOXYzine (ATARAX) 25 MG tablet Take 1 tablet (25 mg total) by mouth 3 (three) times daily. 09/26/21   Charlott Rakes, MD  lidocaine (LIDODERM) 5 % Place 1 patch onto the skin daily. Patient taking differently: Place 2 patches onto the skin daily as needed (pain). 08/26/21   Charlott Rakes, MD  methimazole (TAPAZOLE) 5 MG tablet Take 1 tablet (5 mg total) by mouth daily. 12/13/21   Shamleffer, Melanie Crazier, MD  methylPREDNISolone (MEDROL DOSEPAK) 4 MG TBPK tablet Take as directed Patient not taking: Reported on 01/24/2022 12/30/21   Enrique Sack, FNP  norethindrone (AYGESTIN) 5 MG tablet Take 1 tablet (5 mg  total) by mouth 2 (two) times daily. 11/23/21   Megan Salon, MD  ondansetron (ZOFRAN-ODT) 4 MG disintegrating tablet Take 1 tablet (4 mg total) by mouth every 8 (eight) hours as needed for nausea or vomiting. 08/18/21   Sharyn Creamer, MD  pantoprazole (PROTONIX) 40 MG tablet TAKE 1 TABLET(40 MG) BY MOUTH TWICE DAILY BEFORE A MEAL 02/03/22   Sharyn Creamer, MD  Phenylephrine-Acetaminophen (TYLENOL SINUS+HEADACHE PO) Take 2 tablets by mouth daily as needed (allergies).    [provider]  pregabalin (LYRICA) 75 MG capsule  Take 1 capsule (75 mg total) by mouth 3 (three) times daily. Patient taking differently: Take 75 mg by mouth 3 (three) times daily as needed (pain). 10/28/21   Kirsteins, Luanna Salk, MD  sucralfate (CARAFATE) 1 g tablet Take 1 tablet (1 g total) by mouth 4 (four) times daily -  with meals and at bedtime. 11/28/21   Azucena Cecil, PA-C  tiZANidine (ZANAFLEX) 2 MG tablet Take 1 tablet (2 mg total) by mouth every 6 (six) hours as needed for muscle spasms. 09/26/21   Charlott Rakes, MD  medroxyPROGESTERone (PROVERA) 10 MG tablet Take 2 tablets by mouth daily Patient not taking: Reported on 06/05/2019 02/06/18 02/02/20  Sloan Leiter, MD      Allergies    Latex    Review of Systems   Review of Systems  Cardiovascular:  Positive for chest pain.    Physical Exam Updated Vital Signs BP 107/73   Pulse 84   Temp 98.2 F (36.8 C) (Oral)   Resp 15   Ht '5\' 6"'$  (1.676 m)   Wt 90.7 kg   LMP 01/24/2022   SpO2 100%   BMI 32.28 kg/m  Physical Exam Vitals and nursing note reviewed.  Constitutional:      General: She is not in acute distress.    Appearance: She is well-developed.  HENT:     Head: Normocephalic.     Comments: Mild uvular swelling and erythema, midline, no tonsillar swelling or exudates, no tongue swelling, no trismus, tolerating secretions, normal phonation Eyes:     Extraocular Movements: Extraocular movements intact.  Cardiovascular:     Rate and Rhythm: Normal rate and regular rhythm.     Pulses:          Radial pulses are 2+ on the right side and 2+ on the left side.     Heart sounds: Normal heart sounds.  Pulmonary:     Effort: Pulmonary effort is normal.     Breath sounds: Normal breath sounds.  Abdominal:     Palpations: Abdomen is soft.     Tenderness: There is abdominal tenderness (Mild, diffuse, no rebound or guarding).  Musculoskeletal:        General: Normal range of motion.     Cervical back: Normal range of motion and neck supple.     Right lower leg:  No edema.     Left lower leg: No edema.  Skin:    General: Skin is warm and dry.  Neurological:     General: No focal deficit present.     Mental Status: She is alert and oriented to person, place, and time.  Psychiatric:        Mood and Affect: Mood normal.        Behavior: Behavior normal.     ED Results / Procedures / Treatments   Labs (all labs ordered are listed, but only abnormal results are displayed) Labs Reviewed  COMPREHENSIVE  METABOLIC PANEL - Abnormal; Notable for the following components:      Result Value   Potassium 3.0 (*)    Calcium 8.8 (*)    AST 14 (*)    All other components within normal limits  SARS CORONAVIRUS 2 BY RT PCR  CBC  I-STAT BETA HCG BLOOD, ED ([facility], [facility], [facility] ONLY)  TROPONIN I (HIGH SENSITIVITY)  TROPONIN I (HIGH SENSITIVITY)    EKG EKG Interpretation  Date/Time:  Saturday February 04 2022 04:32:07 EDT Ventricular Rate:  87 PR Interval:  178 QRS Duration: 70 QT Interval:  417 QTC Calculation: 502 R Axis:   29 Text Interpretation: Sinus rhythm Multiform ventricular premature complexes Anterior infarct, old No significant change compared to last EKG Confirmed by Oneal Deputy [id number]) on 02/04/2022 8:58:37 AM  Radiology DG Chest 2 View  Result Date: 02/04/2022 CLINICAL DATA:  41 year old female with history of chest pain and shortness of breath. EXAM: CHEST - 2 VIEW COMPARISON:  Chest x-ray 11/28/2021. FINDINGS: Lung volumes are normal. No consolidative airspace disease. No pleural effusions. No pneumothorax. No pulmonary nodule or mass noted. Pulmonary vasculature and the cardiomediastinal silhouette are within normal limits. IMPRESSION: No radiographic evidence of acute cardiopulmonary disease. Electronically Signed   By: Vinnie Langton M.D.   On: 02/04/2022 05:43    Procedures Procedures    Medications Ordered in ED Medications  potassium chloride (KLOR-CON) packet 40 mEq (has no administration in time range)  dexamethasone  (DECADRON) 1 MG/ML solution 4 mg (has no administration in time range)    ED Course/ Medical Decision Making/ A&P Clinical Course as of 02/04/22 1146  Sat Feb 04, 2022  1134 Patient was additionally complaining of some dysuria and foul-smelling urine.  Her urine was tested for UTI and is positive with bacteria, leukocytes and nitrites.  She will be treated with Keflex.  Patient stable for discharge home with primary care and cardiology follow-up. [VK]    Clinical Course User Index [VK] Ottie Glazier, DO                           Medical Decision Making This patient presents to the ED with chief complaint(s) of palpitations, shortness of breath with pertinent past medical history of GERD which further complicates the presenting complaint. The complaint involves an extensive differential diagnosis and also carries with it a high risk of complications and morbidity.    The differential diagnosis includes ACS, arrhythmia, electrolyte abnormality, pneumonia, CHF, viral syndrome  Additional history obtained: Records reviewed Care Everywhere/External Records and Primary Care Documents  ED Course and Reassessment: Patient was initially evaluated by triage and had labs performed, EKG and chest x-ray.  Labs show mild hypokalemia with a potassium of 3.0.  EKG shows PVCs but otherwise no acute ischemic changes or arrhythmia.  PVCs may be related to her hypokalemia which will be repleted in the emergency department.  This may be contributing to her palpitations.  The patient has mild uvular swelling and in the setting of her increased congestion and cough, concern for possible viral syndrome and she will be tested for COVID-19.  She was also given Decadron for her swelling.  Patient's chest x-ray showed no evidence of pulmonary edema or pneumonia as causes of her symptoms.  Independent labs interpretation:  The following labs were independently interpreted: Labs show hypokalemia with a potassium of  3.0, otherwise within normal range, UA positive for UTI  Independent visualization of imaging: - I  independently visualized the following imaging with scope of interpretation limited to determining acute life threatening conditions related to emergency care: Chest x-ray, which revealed no acute disease  Consultation: - Consulted or discussed management/test interpretation w/ external professional: N/A  Consideration for admission or further workup: Patient has no emergent conditions that require admission at this time.  She is stable for discharge home with outpatient primary care follow-up.  She will be given prescription for her UTI as well as for her potassium repletion Social Determinants of health: N/A    Amount and/or Complexity of Data Reviewed Labs: ordered. Radiology: ordered. ECG/medicine tests: ordered.  Risk Prescription drug management.           Final Clinical Impression(s) / ED Diagnoses Final diagnoses:  Hypokalemia  PVC (premature ventricular contraction)  Uvulitis    Rx / DC Orders ED Discharge Orders     None         Ottie Glazier, DO 02/04/22 1147

## 2022-02-04 NOTE — ED Triage Notes (Signed)
Reports chest pain and shortness of  breath for several weeks that worsened tonight. Feels like there is a lump in her throat.   Sts hx of thyroid issues.

## 2022-02-04 NOTE — Discharge Instructions (Addendum)
You were seen in the emergency department for your palpitations, shortness of breath, and cloudy urine.  Your EKG did show that you have PVCs which is a few extra beats that sometimes you can feel.  Your potassium level was low which may be contributing to these PVCs.  We did give you a potassium repletion in the emergency department and I gave you a short prescription of potassium that you should take as prescribed.  Your work-up also did show that you have a urinary tract infection and this will be needed with antibiotics and you should complete this as prescribed.  You may have a viral infection as well that is causing your increased mucus, sore throat and uvula swelling.  We gave you steroids that should help with the swelling and you can take Tylenol or Motrin as needed for fevers, body aches or pain.  You should follow-up with your primary doctor in the next week to have your symptoms rechecked.  You should return to the emergency department if you are having worsening fevers, repetitive vomiting, worsening shortness of breath, you are having tongue or throat swelling, change in your voice, or if you have any other new or concerning symptoms.

## 2022-02-06 ENCOUNTER — Encounter (HOSPITAL_COMMUNITY): Payer: Self-pay | Admitting: *Deleted

## 2022-02-06 ENCOUNTER — Ambulatory Visit (HOSPITAL_COMMUNITY)
Admission: EM | Admit: 2022-02-06 | Discharge: 2022-02-06 | Disposition: A | Discharge status: Home / Self Care | Payer: Commercial Managed Care - HMO

## 2022-02-06 DIAGNOSIS — S8011XA Contusion of right lower leg, initial encounter: Secondary | ICD-10-CM

## 2022-02-06 NOTE — ED Triage Notes (Signed)
Pt states that she thinks something bite her lower right leg yesterday, she says her legs has been itching and she had a headache.

## 2022-02-06 NOTE — Discharge Instructions (Signed)
On exam there is a small knot present to the front of your right leg, this appears to be a bruise underneath the skin and will improve with time, no signs of infection or area on exam and there is no pinpoint mark that would indicate that something between  You may place ice or heat over the affected area in 10 to 15-minute intervals  You may rest and complete activity as tolerated, may elevate your leg when sitting and lying to help reduce any swelling  May apply any topical products over the area such as diclofenac or lidocaine for comfort, may take oral Tylenol or ibuprofen in addition as needed  May follow-up for reevaluation as needed

## 2022-02-06 NOTE — ED Provider Notes (Signed)
[facility]    CSN: [id number] Arrival date & time: 02/06/22  1747      History   Chief Complaint Chief Complaint  Patient presents with   Insect Bite    HPI Laurie Bell is a 41 y.o. female.   Patient presents for evaluation for a knot to her right lower extremity beginning 1 day ago.  Associated erythema and pain.  Possible bug bite but insect unwitnessed.  Denies injury or trauma.  Pain has worsened today radiating up the front of the leg.  Has attempted use of an ice pack and cleansed with alcohol which has been unaffected.  Denies fever, chills or drainage.    Past Medical History:  Diagnosis Date   Anxiety    Arthritis    Chlamydia contact, treated    GERD (gastroesophageal reflux disease)    Hyperthyroidism     Patient Active Problem List   Diagnosis Date Noted   MDD (major depressive disorder), recurrent episode, moderate ([place]) 11/03/2021   GAD (generalized anxiety disorder) 11/03/2021   Neck pain 10/28/2021   Cervical high risk HPV (human papillomavirus) test positive 10/17/2021   Irregular heart rhythm 04/15/2021   Numbness on left side 04/15/2021   Dysphagia 03/17/2021   Hypomagnesemia 02/23/2021   Gastroesophageal reflux disease    Hypokalemia 08/24/2020   Atypical chest pain 08/24/2020   Nausea and vomiting 08/24/2020   Hypocalcemia 08/24/2020   DUB (dysfunctional uterine bleeding) 01/12/2017   Fibroid, uterine 01/12/2017    Past Surgical History:  Procedure Laterality Date   ENDOMETRIAL ABLATION N/A 04/25/2017   Procedure: ENDOMETRIAL ABLATION With NOVASURE;  Surgeon: Woodroe Mode, MD;  Location: [facility];  Service: Gynecology;  Laterality: N/A;   ESOPHAGEAL MANOMETRY N/A 07/27/2021   Procedure: ESOPHAGEAL MANOMETRY (EM);  Surgeon: Sharyn Creamer, MD;  Location: [facility];  Service: Gastroenterology;  Laterality: N/A;   TUBAL LIGATION  2003    OB History     Gravida  2   Para  2   Term  2   Preterm   0   AB  0   Living         SAB  0   IAB  0   Ectopic  0   Multiple      Live Births               Home Medications    Prior to Admission medications   Medication Sig Start Date End Date Taking? Authorizing Provider  acetaminophen (TYLENOL) 500 MG tablet Take 1,000 mg by mouth every 6 (six) hours as needed for moderate pain.   Yes [provider]  ASHWAGANDHA PO Take 2 capsules by mouth 2 (two) times daily as needed (anxiety).   Yes [provider]  baclofen (LIORESAL) 10 MG tablet Take 10 mg by mouth daily as needed for muscle spasms.   Yes [provider]  cephALEXin (KEFLEX) 500 MG capsule Take 1 capsule (500 mg total) by mouth 4 (four) times daily. 02/04/22  Yes Kneller, Norman Herrlich, DO  cyclobenzaprine (FLEXERIL) 10 MG tablet Take 1 tablet (10 mg total) by mouth at bedtime. Patient taking differently: Take 10 mg by mouth at bedtime as needed for muscle spasms. 01/24/22  Yes Kirsteins, Luanna Salk, MD  famotidine (PEPCID) 20 MG tablet Take 1 tablet (20 mg total) by mouth 2 (two) times daily. 10/26/21  Yes Sharyn Creamer, MD  FLUoxetine (PROZAC) 40 MG capsule Take 1 capsule (40 mg  total) by mouth daily. Patient taking differently: Take 40 mg by mouth daily as needed (depression). 08/18/21  Yes Newlin, Charlane Ferretti, MD  fluticasone (FLONASE) 50 MCG/ACT nasal spray Place 2 sprays into both nostrils daily. Patient taking differently: Place 2 sprays into both nostrils daily as needed for allergies. 12/30/21  Yes Enrique Sack, FNP  hydrOXYzine (ATARAX) 25 MG tablet Take 1 tablet (25 mg total) by mouth 3 (three) times daily. Patient taking differently: Take 25 mg by mouth at bedtime. 09/26/21  Yes Newlin, Charlane Ferretti, MD  lidocaine (LIDODERM) 5 % Place 1 patch onto the skin daily. Patient taking differently: Place 2 patches onto the skin daily as needed (pain). 08/26/21  Yes Charlott Rakes, MD  methimazole (TAPAZOLE) 5 MG tablet Take 1 tablet (5 mg total) by mouth  daily. 12/13/21  Yes Shamleffer, Melanie Crazier, MD  ondansetron (ZOFRAN-ODT) 4 MG disintegrating tablet Take 1 tablet (4 mg total) by mouth every 8 (eight) hours as needed for nausea or vomiting. 08/18/21  Yes Sharyn Creamer, MD  pantoprazole (PROTONIX) 40 MG tablet TAKE 1 TABLET(40 MG) BY MOUTH TWICE DAILY BEFORE A MEAL Patient taking differently: Take 40 mg by mouth 2 (two) times daily. 02/03/22  Yes Sharyn Creamer, MD  potassium chloride (KLOR-CON) 10 MEQ tablet Take 1 tablet (10 mEq total) by mouth daily. 02/04/22  Yes Kneller, Norman Herrlich, DO  pregabalin (LYRICA) 75 MG capsule Take 1 capsule (75 mg total) by mouth 3 (three) times daily. Patient taking differently: Take 75 mg by mouth 3 (three) times daily as needed (pain). 10/28/21  Yes Kirsteins, Luanna Salk, MD  sucralfate (CARAFATE) 1 g tablet Take 1 tablet (1 g total) by mouth 4 (four) times daily -  with meals and at bedtime. 11/28/21  Yes Azucena Cecil, PA-C  tiZANidine (ZANAFLEX) 2 MG tablet Take 1 tablet (2 mg total) by mouth every 6 (six) hours as needed for muscle spasms. Patient taking differently: Take 2 mg by mouth at bedtime as needed for muscle spasms. 09/26/21  Yes Charlott Rakes, MD  AMBULATORY NON FORMULARY MEDICATION 90 ml viscous lidocaine, 90 ml '10mg'$ /5 ml Dicyclomine, 270 ml malox. Take 10 ml twice daily. Patient taking differently: Take 10 mLs by mouth See admin instructions. 90 ml viscous lidocaine, 90 ml '10mg'$ /5 ml Dicyclomine, 270 ml malox. Take 10 ml twice daily as needed for heartburn 09/15/21   Sharyn Creamer, MD  diclofenac Sodium (VOLTAREN) 1 % GEL Apply 2 g topically 4 (four) times daily. Patient not taking: Reported on 02/04/2022 06/10/21   Ezequiel Essex, MD  norethindrone (AYGESTIN) 5 MG tablet Take 1 tablet (5 mg total) by mouth 2 (two) times daily. Patient not taking: Reported on 02/04/2022 11/23/21   Megan Salon, MD  Phenylephrine-Acetaminophen (TYLENOL SINUS+HEADACHE PO) Take 2 tablets by mouth daily as needed  (allergies).    [provider]  medroxyPROGESTERone (PROVERA) 10 MG tablet Take 2 tablets by mouth daily Patient not taking: Reported on 06/05/2019 02/06/18 02/02/20  Sloan Leiter, MD    Family History Family History  Problem Relation Age of Onset   Hypertension Mother    Hypertension Maternal Aunt    Cancer Maternal Grandmother        type unknown   Esophageal cancer Neg Hx    Rectal cancer Neg Hx    Stomach cancer Neg Hx    Colon cancer Neg Hx     Social History Social History   Tobacco Use   Smoking status: Never  Smokeless tobacco: Never  Vaping Use   Vaping Use: Never used  Substance Use Topics   Alcohol use: No   Drug use: Not Currently    Types: Marijuana    Comment: quit 2018     Allergies   Latex   Review of Systems Review of Systems  Constitutional: Negative.   Respiratory: Negative.    Cardiovascular: Negative.   Skin:  Positive for wound. Negative for color change, pallor and rash.     Physical Exam Triage Vital Signs ED Triage Vitals  Enc Vitals Group     BP 02/06/22 1845 106/83     Pulse Rate 02/06/22 1845 80     Resp 02/06/22 1845 18     Temp 02/06/22 1845 98 F (36.7 C)     Temp Source 02/06/22 1845 Oral     SpO2 02/06/22 1845 100 %     Weight --      Height --      Head Circumference --      Peak Flow --      Pain Score 02/06/22 1842 0     Pain Loc --      Pain Edu? --      Excl. in [place]? --    No data found.  Updated Vital Signs BP 106/83 (BP Location: Right Arm)   Pulse 80   Temp 98 F (36.7 C) (Oral)   Resp 18   LMP 01/24/2022   SpO2 100%   Visual Acuity Right Eye Distance:   Left Eye Distance:   Bilateral Distance:    Right Eye Near:   Left Eye Near:    Bilateral Near:     Physical Exam Constitutional:      Appearance: Normal appearance.  HENT:     Head: Normocephalic.  Eyes:     Extraocular Movements: Extraocular movements intact.  Pulmonary:     Effort: Pulmonary effort is normal.  Skin:     Comments: Less than 0.5 cm hematoma present to the medial aspect of the right shin, mild erythema present, tender to palpation  Neurological:     Mental Status: She is alert and oriented to person, place, and time. Mental status is at baseline.  Psychiatric:        Mood and Affect: Mood normal.        Behavior: Behavior normal.      UC Treatments / Results  Labs (all labs ordered are listed, but only abnormal results are displayed) Labs Reviewed - No data to display  EKG   Radiology No results found.  Procedures Procedures (including critical care time)  Medications Ordered in UC Medications - No data to display  Initial Impression / Assessment and Plan / UC Course  I have reviewed the triage vital signs and the nursing notes.  Pertinent labs & imaging results that were available during my care of the patient were reviewed by me and considered in my medical decision making (see chart for details).  Contusion to the right lower extremity, initial encounter  No signs of infection, is intact and there are no pinpoint markings indicate insect bite or abrasion, discussed with patient, presentation is most consistent with a contusion and should improve with time without complication, recommended supportive measures such as ice and heat over the affected areas, oral and topical analgesics and elevation, may follow-up with urgent care as needed Final Clinical Impressions(s) / UC Diagnoses   Final diagnoses:  None   Discharge Instructions   None  ED Prescriptions   None    PDMP not reviewed this encounter.   Hans Eden, NP 02/06/22 1907

## 2022-02-08 ENCOUNTER — Other Ambulatory Visit: Payer: Self-pay | Admitting: Family Medicine

## 2022-02-08 ENCOUNTER — Ambulatory Visit
Admission: RE | Admit: 2022-02-08 | Discharge: 2022-02-08 | Disposition: A | Discharge status: Home / Self Care | Payer: Commercial Managed Care - HMO | Source: Ambulatory Visit | Attending: Physical Medicine & Rehabilitation | Admitting: Physical Medicine & Rehabilitation

## 2022-02-08 DIAGNOSIS — M5412 Radiculopathy, cervical region: Secondary | ICD-10-CM

## 2022-02-08 NOTE — Telephone Encounter (Unsigned)
Copied from [place] [phone]. Topic: General - Other >> Feb 08, 2022  9:59 AM Everette C wrote: Reason for CRM: The patient has been recently seen in the ED and was prescribed potassium chloride (KLOR-CON) 10 MEQ tablet [[id number]]   The patient would like to continue taking the medication regularly but is unable to schedule an office visit at the time of call with agent   Please contact the patient to discuss their medication further when possible

## 2022-02-09 NOTE — Telephone Encounter (Signed)
Patient called back this morning to get an update on the medication refill. Please advise.

## 2022-02-09 NOTE — Telephone Encounter (Signed)
Requested medication (s) are due for refill today: Yes  Requested medication (s) are on the active medication list: Yes  Last refill:  02/04/22  Future visit scheduled: Yes  Notes to clinic:  Unable to refill per protocol, last refill by another provider. Patient would like to continue taking.     Requested Prescriptions  Pending Prescriptions Disp Refills   potassium chloride (KLOR-CON) 10 MEQ tablet 7 tablet 0    Sig: Take 1 tablet (10 mEq total) by mouth daily.     Endocrinology:  Minerals - Potassium Supplementation Failed - 02/09/2022  9:51 AM      Failed - K in normal range and within 360 days    Potassium  Date Value Ref Range Status  02/04/2022 3.0 (L) 3.5 - 5.1 mmol/L Final         Passed - Cr in normal range and within 360 days    Creatinine, Ser  Date Value Ref Range Status  02/04/2022 0.83 0.44 - 1.00 mg/dL Final         Passed - Valid encounter within last 12 months    Recent Outpatient Visits           3 weeks ago Cervical radiculopathy   [place], [place]ong, MD   4 months ago Cervical radiculopathy   [place], Enobong, MD   7 months ago Cervical radiculopathy   [place], Enobong, MD   7 months ago Cervical radiculopathy   [place], Enobong, MD   8 months ago Decreased appetite   [facility], MD       Future Appointments             In 5 months Charlott Rakes, MD [place]

## 2022-02-14 ENCOUNTER — Ambulatory Visit: Payer: Self-pay | Admitting: *Deleted

## 2022-02-14 ENCOUNTER — Telehealth: Payer: Self-pay | Admitting: *Deleted

## 2022-02-14 NOTE — Telephone Encounter (Signed)
Summary: Potassium Advice   Pt is calling for advice appt was scheduled for the patient to get a script for potassium chloride (KLOR-CON) 10 MEQ tablet [[id number]] . Pt previously managed by eating bananas but now she cant because she get acid relux. Pleas advise

## 2022-02-14 NOTE — Telephone Encounter (Signed)
-----   Message from Jettie Booze, MD sent at 02/14/2022  1:25 PM EDT ----- Normal sinus rhythm with frequent PVCs.   Patient symptoms reliably correlated to PVCs.   No sustained arrhythmias.  Could consider low dose beta blocker if she wanted, but risk of fatigue and other side effects in a younger person.

## 2022-02-14 NOTE — Telephone Encounter (Signed)
  Chief Complaint: potasium Rx request Symptoms: seen at ED- fatigue, not feeling well- new diagnosis- hypokalemia  Frequency:   Pertinent Negatives: Patient denies   Disposition: '[]'$ ED /'[]'$ Urgent Care (no appt availability in office) / '[]'$ Appointment(In office/virtual)/ '[]'$  [place] [facility]/ '[]'$ Home Care/ '[]'$ Refused Recommended Disposition /'[]'$ [place] [facility]/ '[x]'$  Follow-up with PCP Additional Notes: Patient has follow up appointment- but will be out of medication- wants to know if she needs lab/refill before her appointment   Reason for Disposition  [1] Pharmacy calling with prescription questions AND [2] triager unable to answer question  Answer Assessment - Initial Assessment Questions 1. DRUG NAME: "What medicine do you need to have refilled?"     Potassium 2. REFILLS REMAINING: "How many refills are remaining?" (Note: The label on the medicine or pill bottle will show how many refills are remaining. If there are no refills remaining, then a renewal may be needed.)     none 3. EXPIRATION DATE: "What is the expiration date?" (Note: The label states when the prescription will expire, and thus can no longer be refilled.)       4. PRESCRIBING HCP: "Who prescribed it?" Reason: If prescribed by specialist, call should be referred to that group.     ED  Patient reports she was seen at ED and diagnosed with low potassium level. Patient states she was advised to follow up for evaluation by PCP for Rx. Patient appointment is in October- but she is out of her medication and wants to know if she should continue. Does she need lab now or can she get Rx until her appointment? Please let her know.  Protocols used: Medication Refill and Renewal Call-A-AH

## 2022-02-14 NOTE — Telephone Encounter (Signed)
I spoke with patient and reviewed monitor results with her.  She does not want to start a medication that would cause fatigue. She does not drink caffeine.  Uses sinus medication on rare occassions.  Follow up with Dr Irish Lack was to be as needed.  Patient reports she has been told by several doctors she has skipped beats.  Patient states these affect her breathing at times.  Appointment made with Christen Bame, NP on March 07, 2022 to discuss options.

## 2022-02-14 NOTE — Telephone Encounter (Signed)
Left message to call office

## 2022-02-15 ENCOUNTER — Telehealth: Payer: Self-pay | Admitting: Emergency Medicine

## 2022-02-15 MED ORDER — POTASSIUM CHLORIDE ER 10 MEQ PO TBCR: 10.0000 meq | EXTENDED_RELEASE_TABLET | Freq: Every day | ORAL | 2 refills | Status: DC

## 2022-02-15 NOTE — Telephone Encounter (Addendum)
Patient aware rx was sent.

## 2022-02-15 NOTE — Telephone Encounter (Signed)
Duplicate message. PCP has been sent a message already regarding her medication refill. Pt will be called once PCP responds.

## 2022-02-15 NOTE — Addendum Note (Signed)
Addended by: Charlott Rakes on: 02/15/2022 04:11 PM   Modules accepted: Orders

## 2022-02-15 NOTE — Telephone Encounter (Signed)
Copied from [place] [phone]. Topic: General - Other >> Feb 10, 2022  4:17 PM Leitha Schuller wrote: Pt checking status of return call regarding being prescribe potassium   Please reference 8-30 resolved crm and fu w/ pt

## 2022-02-15 NOTE — Telephone Encounter (Signed)
Prescription sent to Pharmacy.  

## 2022-02-23 ENCOUNTER — Ambulatory Visit (INDEPENDENT_AMBULATORY_CARE_PROVIDER_SITE_OTHER): Payer: Commercial Managed Care - HMO | Admitting: Licensed Clinical Social Worker

## 2022-02-23 DIAGNOSIS — F411 Generalized anxiety disorder: Secondary | ICD-10-CM | POA: Diagnosis not present

## 2022-02-23 DIAGNOSIS — F331 Major depressive disorder, recurrent, moderate: Secondary | ICD-10-CM | POA: Diagnosis not present

## 2022-02-23 NOTE — Progress Notes (Signed)
   THERAPIST PROGRESS NOTE  Virtual Visit via Video Note  I connected with Laurie Bell on 02/23/22 at  2:00 PM EDT by a video enabled telemedicine application and verified that I am speaking with the correct person using two identifiers.  Location: Patient: [facility]  Provider: Providers Home    I discussed the limitations of evaluation and management by telemedicine and the availability of in person appointments. The patient expressed understanding and agreed to proceed.     I discussed the assessment and treatment plan with the patient. The patient was provided an opportunity to ask questions and all were answered. The patient agreed with the plan and demonstrated an understanding of the instructions.   The patient was advised to call back or seek an in-person evaluation if the symptoms worsen or if the condition fails to improve as anticipated.  I provided 20 minutes of non-face-to-face time during this encounter.   Dory Horn, LCSW  Participation Level: Active  Behavioral Response: CasualAlertAnxious and Depressed  Type of Therapy: Individual Therapy  Treatment Goals addressed: decrease PHQ-9 below 10   ProgressTowards Goals: Progressing  Interventions: Solution Focused    Suicidal/Homicidal: Nowithout intent/plan  Therapist Response:    Pt was alert and oriented x 5. She was dressed casually and engaged well in therapy session. Pt presented with depressed and anxious mood/affect. She was pleasant, cooperative, and maintained good eye contact.   LCSW spoke today about no shows for appointment, as pt no showed for her last appointment. LCSW explained no show policy to pt and pt was agreeable. LCSW offered pt other times to complete appointments. Pt reports due to her work scheduled 4pm appointment would be best. LCSW administered a GAD-7. LCSW administered a PHQ-9. LCSW reviewed scores with pt. Plan is to switch pt to 4pm appointments if those do not  work for pt, pt was agreeable to switch to walk in appointments. LCSW spoke today with pt about stressors and pt reported none. LCSW used solution focused therapy in today's session as evidence by accommodating pt appointments to be able to continue with therapy.  Plan: Return again in 3 weeks.  Diagnosis: GAD (generalized anxiety disorder)  MDD (major depressive disorder), recurrent episode, moderate ([place])  Collaboration of Care: Other None reported today  Patient/Guardian was advised Release of Information must be obtained prior to any record release in order to collaborate their care with an outside provider. Patient/Guardian was advised if they have not already done so to contact the registration department to sign all necessary forms in order for [place] to release information regarding their care.   Consent: Patient/Guardian gives verbal consent for treatment and assignment of benefits for services provided during this visit. Patient/Guardian expressed understanding and agreed to proceed.   Dory Horn, LCSW 02/23/2022

## 2022-03-04 NOTE — Progress Notes (Deleted)
Cardiology Office Note:    Date:  03/04/2022   ID:  Laurie Bell, DOB Aug 20, 1980, MRN [id number]  PCP:  Charlott Rakes, MD   [facility] Providers Cardiologist:  Larae Grooms, MD { Click to update primary MD,subspecialty MD or APP then REFRESH:1}    Referring MD: Charlott Rakes, MD   Chief Complaint: ***  History of Present Illness:    Laurie Bell is a *** 41 y.o. female with a hx of palpitations, PVCs hypokalemia  Referred to cardiology by PCP for evaluation of palpitations and seen by Dr. Irish Lack on 05/17/21.  She felt occasional irregular heartbeats.  EKG at PCP office showed multiple PVCs.  Echo 08/26/2020 revealed normal LVEF 65 to 44%, normal diastolic parameters, no RWMA, normal RV. Also having symptoms of reflux.  TSH was low indicating hyperthyroidism.  Results were sent to PCP for management.  Free T4 and free T3 were normal, no further management indicated at that time.  Note from Dr. Sonny Masters regarding frequent PVCs noted on exam. Cardiac monitor was ordered and revealed sinus rhythm with frequent PVCs (14.4%), no sustained arrhythmias. Advised she could consider low dose BB.  Did not want to take medication due to concern for fatigue but was feeling more short of breath.  It was scheduled.  Today, she is here    Past Medical History:  Diagnosis Date   Anxiety    Arthritis    Chlamydia contact, treated    GERD (gastroesophageal reflux disease)    Hyperthyroidism     Past Surgical History:  Procedure Laterality Date   ENDOMETRIAL ABLATION N/A 04/25/2017   Procedure: ENDOMETRIAL ABLATION With NOVASURE;  Surgeon: Woodroe Mode, MD;  Location: [place];  Service: Gynecology;  Laterality: N/A;   ESOPHAGEAL MANOMETRY N/A 07/27/2021   Procedure: ESOPHAGEAL MANOMETRY (EM);  Surgeon: Sharyn Creamer, MD;  Location: [facility];  Service: Gastroenterology;  Laterality: N/A;   TUBAL LIGATION  2003    Current Medications: No  outpatient medications have been marked as taking for the 03/07/22 encounter (Appointment) with Ann Maki, Lanice Schwab, NP.     Allergies:   Latex   Social History   Socioeconomic History   Marital status: Single    Spouse name: Not on file   Number of children: 2   Years of education: Not on file   Highest education level: Not on file  Occupational History   Occupation: Best boy: [employer]  Tobacco Use   Smoking status: Never   Smokeless tobacco: Never  Vaping Use   Vaping Use: Never used  Substance and Sexual Activity   Alcohol use: No   Drug use: Not Currently    Types: Marijuana    Comment: quit 2018   Sexual activity: Not Currently    Birth control/protection: Surgical  Other Topics Concern   Not on file  Social History Narrative   ** Merged History Encounter **       Right Handed  Lives in a one story home    Social Determinants of Health   Financial Resource Strain: Medium Risk (11/03/2021)   Overall Financial Resource Strain (CARDIA)    Difficulty of Paying Living Expenses: Somewhat hard  Food Insecurity: No Food Insecurity (06/16/2021)   Hunger Vital Sign    Worried About Running Out of Food in the Last Year: Never true    Ran Out of Food in the Last Year: Never true  Transportation Needs: No Transportation Needs (11/03/2021)  PRAPARE - Hydrologist (Medical): No    Lack of Transportation (Non-Medical): No  Physical Activity: Sufficiently Active (11/03/2021)   Exercise Vital Sign    Days of Exercise per Week: 3 days    Minutes of Exercise per Session: 50 min  Stress: Stress Concern Present (11/03/2021)   Stem    Feeling of Stress : Very much  Social Connections: Moderately Isolated (11/03/2021)   Social Connection and Isolation Panel [NHANES]    Frequency of Communication with Friends and Family: Once a week    Frequency of Social Gatherings with  Friends and Family: Once a week    Attends Religious Services: More than 4 times per year    Active Member of [employer] or Organizations: No    Attends Archivist Meetings: Never    Marital Status: Living with partner     Family History: The patient's ***family history includes Cancer in her maternal grandmother; Hypertension in her maternal aunt and mother. There is no history of Esophageal cancer, Rectal cancer, Stomach cancer, or Colon cancer.  ROS:   Please see the history of present illness.    *** All other systems reviewed and are negative.  Labs/Other Studies Reviewed:    The following studies were reviewed today:  Cardiac monitor 02/13/22    Normal sinus rhythm with frequent PVCs.   Patient symptoms reliably correlated to PVCs.   No sustained arrhythmias.    Echo 08/26/20   1. Left ventricular ejection fraction, by estimation, is 65 to 70%. The  left ventricle has normal function. The left ventricle has no regional  wall motion abnormalities. Left ventricular diastolic parameters were  normal.   2. Right ventricular systolic function is normal. The right ventricular  size is normal.   3. The mitral valve is normal in structure. Trivial mitral valve  regurgitation.   4. The aortic valve is normal in structure. Aortic valve regurgitation is  not visualized.    Recent Labs: 01/13/2022: TSH 0.50 02/04/2022: ALT 12; BUN 13; Creatinine, Ser 0.83; Hemoglobin 12.8; Platelets 244; Potassium 3.0; Sodium 138  Recent Lipid Panel No results found for: "CHOL", "TRIG", "HDL", "CHOLHDL", "VLDL", "LDLCALC", "LDLDIRECT"   Risk Assessment/Calculations:   {Does this patient have ATRIAL FIBRILLATION?:[phone]}       Physical Exam:    VS:  There were no vitals taken for this visit.    Wt Readings from Last 3 Encounters:  02/04/22 200 lb (90.7 kg)  01/24/22 199 lb 6.4 oz (90.4 kg)  01/18/22 202 lb 3.2 oz (91.7 kg)     GEN: *** Well nourished, well developed in no  acute distress HEENT: Normal NECK: No JVD; No carotid bruits CARDIAC: ***RRR, no murmurs, rubs, gallops RESPIRATORY:  Clear to auscultation without rales, wheezing or rhonchi  ABDOMEN: Soft, non-tender, non-distended MUSCULOSKELETAL:  No edema; No deformity. *** pedal pulses, ***bilaterally SKIN: Warm and dry NEUROLOGIC:  Alert and oriented x 3 PSYCHIATRIC:  Normal affect   EKG:  EKG is *** ordered today.  The ekg ordered today demonstrates ***  No BP recorded.  {Refresh Note OR Click here to enter BP  :1}***    Diagnoses:    No diagnosis found. Assessment and Plan:     Frequent PVCs  {Are you ordering a CV Procedure (e.g. stress test, cath, DCCV, TEE, etc)?   Press F2        :[id number]}   Disposition:  Medication Adjustments/Labs and  Tests Ordered: Current medicines are reviewed at length with the patient today.  Concerns regarding medicines are outlined above.  No orders of the defined types were placed in this encounter.  No orders of the defined types were placed in this encounter.   There are no Patient Instructions on file for this visit.   Signed, Emmaline Life, NP  03/04/2022 12:48 PM    [place]

## 2022-03-07 ENCOUNTER — Ambulatory Visit: Payer: Commercial Managed Care - HMO | Admitting: Nurse Practitioner

## 2022-03-08 ENCOUNTER — Other Ambulatory Visit: Payer: Self-pay | Admitting: Internal Medicine

## 2022-03-08 ENCOUNTER — Ambulatory Visit: Payer: Self-pay | Admitting: Family Medicine

## 2022-03-14 ENCOUNTER — Encounter: Payer: Self-pay | Admitting: Physical Medicine & Rehabilitation

## 2022-03-14 ENCOUNTER — Encounter
Payer: Commercial Managed Care - HMO | Attending: Physical Medicine & Rehabilitation | Admitting: Physical Medicine & Rehabilitation

## 2022-03-14 VITALS — BP 119/83 | HR 88 | Ht 66.0 in | Wt 201.4 lb

## 2022-03-14 DIAGNOSIS — R202 Paresthesia of skin: Secondary | ICD-10-CM | POA: Insufficient documentation

## 2022-03-14 DIAGNOSIS — R200 Anesthesia of skin: Secondary | ICD-10-CM | POA: Insufficient documentation

## 2022-03-14 MED ORDER — CYCLOBENZAPRINE HCL 10 MG PO TABS: 10.0000 mg | ORAL_TABLET | Freq: Every evening | ORAL | 1 refills | Status: DC | PRN

## 2022-03-14 NOTE — Patient Instructions (Signed)
Electromyoneurogram Electromyoneurogram is a test to check how well your muscles and nerves are working. This procedure includes the combined use of electromyogram (EMG) and nerve conduction study (NCS). EMG is used to evaluate muscles and the nerves that control those muscles. NCS, which is also called electroneurogram, measures how well your nerves conduct electricity. The procedures should be done together to check if your muscles and nerves are healthy. If the results of the tests are abnormal, this may indicate disease or injury, such as a neuromuscular disease or peripheral nerve damage. Tell a health care provider about: Any allergies you have. All medicines you are taking, including vitamins, herbs, eye drops, creams, and over-the-counter medicines. Any bleeding problems you have. Any surgeries you have had. Any medical conditions you have. What are the risks? Generally, this is a safe procedure. However, problems may occur, including: Bleeding or bruising. Infection where the electrodes were inserted. What happens before the test? Medicines Take all of your usually prescribed medications before this testing is performed. Do not stop your blood thinners unless advised by your prescribing physician. General instructions Your health care provider may ask you to warm the limb that will be checked with warm water, hot pack, or wrapping the limb in a blanket. Do not use lotions or creams on the same day that you will be having the procedure. What happens during the test? For EMG  Your health care provider will ask you to stay in a position so that the muscle being studied can be accessed. You will be sitting or lying down. You may be given a medicine to numb the area (local anesthetic) and the skin will be disinfected. A very thin needle that has an electrode will be inserted into your muscle, one muscle at a time. Typically, multiple muscles are evaluated during a single study. Another  small electrode will be placed on your skin near the muscle. Your health care provider will ask you to continue to remain still. The electrodes will record the electrical activity of your muscles. You may see this on a monitor or hear it in the room. After your muscles have been studied at rest, your health care provider will ask you to contract or flex your muscles. The electrodes will record the electrical activity of your muscles. Your health care provider will remove the electrodes and the electrode needle when the procedure is finished. The procedure may vary among health care providers and hospitals. For NCS  An electrode that records your nerve activity (recording electrode) will be placed on your skin by the muscle that is being studied. An electrode that is used as a reference (reference electrode) will be placed near the recording electrode. A paste or gel will be applied to your skin between the recording electrode and the reference electrode. Your nerve will be stimulated with a mild shock. The speed of the nerves and strength of response is recorded by the electrodes. Your health care provider will remove the electrodes and the gel when the procedure is finished. The procedure may vary among health care providers and hospitals. What can I expect after the test? It is up to you to get your test results. Ask your health care provider, or the department that is doing the test, when your results will be ready. Your health care provider may: Give you medicines for any pain. Monitor the insertion sites to make sure that bleeding stops. You should be able to drive yourself to and from the test. Discomfort can  persist for a few hours after the test, but should be better the next day. Contact a health care provider if: You have swelling, redness, or drainage at any of the insertion sites. Summary Electromyoneurogram is a test to check how well your muscles and nerves are working. If the  results of the tests are abnormal, this may indicate disease or injury. This is a safe procedure. However, problems may occur, such as bleeding and infection. Your health care provider will do two tests to complete this procedure. One checks your muscles (EMG) and another checks your nerves (NCS). It is up to you to get your test results. Ask your health care provider, or the department that is doing the test, when your results will be ready. This information is not intended to replace advice given to you by your health care provider. Make sure you discuss any questions you have with your health care provider. Document Revised: 02/09/2021 Document Reviewed: 01/09/2021 Elsevier Patient Education  [place].
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
## 2022-03-15 ENCOUNTER — Ambulatory Visit: Payer: Commercial Managed Care - HMO | Admitting: Physician Assistant
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
## 2022-03-17 ENCOUNTER — Ambulatory Visit: Payer: Commercial Managed Care - HMO | Admitting: Nurse Practitioner

## 2022-03-17 DIAGNOSIS — I493 Ventricular premature depolarization: Secondary | ICD-10-CM

## 2022-03-20 ENCOUNTER — Encounter: Payer: Self-pay | Admitting: Internal Medicine

## 2022-03-20 ENCOUNTER — Ambulatory Visit: Payer: Commercial Managed Care - HMO | Attending: Internal Medicine | Admitting: Internal Medicine

## 2022-03-20 ENCOUNTER — Other Ambulatory Visit (HOSPITAL_COMMUNITY): Payer: Self-pay

## 2022-03-20 VITALS — BP 114/80 | HR 93 | Ht 66.0 in | Wt 203.4 lb

## 2022-03-20 DIAGNOSIS — M25561 Pain in right knee: Secondary | ICD-10-CM

## 2022-03-20 DIAGNOSIS — R002 Palpitations: Secondary | ICD-10-CM | POA: Diagnosis not present

## 2022-03-20 DIAGNOSIS — E876 Hypokalemia: Secondary | ICD-10-CM

## 2022-03-20 MED ORDER — NAPROXEN 500 MG PO TABS: 500.0000 mg | ORAL_TABLET | Freq: Two times a day (BID) | ORAL | 1 refills | Status: DC | PRN

## 2022-03-20 MED ORDER — METRONIDAZOLE 500 MG PO TABS
500.0000 mg | ORAL_TABLET | Freq: Two times a day (BID) | ORAL | 0 refills | Status: DC
  Filled 2022-03-20: qty 14, 7d supply, fill #0

## 2022-03-20 NOTE — Progress Notes (Signed)
Patient ID: Laurie Bell, female    DOB: 1980-07-03  MRN: [id number]  CC: UC visit  Subjective: Laurie Bell is a 41 y.o. female who presents for UC visit Her concerns today include:  history of cervical radiculopathy, chronic chest wall pain, anxiety   Patient presents today for recheck of her potassium level.  Potassium level had been running in the low threes.  No history of hypertension.  She is not on any diuretics.  Seen in the emergency room in August with chest pain and palpitations.  Potassium level found to be 3.  EKGs with some PVCs.  Patient called and post ER visit and her PCP Dr. Margarita Bell prescribed potassium supplement 10 mEq to take daily.  She has been taking the medication as prescribed.  She states that the palpitations have decreased significantly since being on the potassium.  Spot of sore spot on RT lower leg x 1 mth.  Seen at urgent care for this in August.  She thought it was an insect bite.  Diagnosed with contusion.  Told to apply ice or heat.  It has persisted.  C/o pain in RT knee.  Started 5 days ago while she was standing at work.  She felt a pop in the knee then started having shooting pains from the knee mainly the medial aspect.  When she got home she noted some swelling in addition to the pain.  Seen at urgent care the following day.  Given an Ace wrap.  Patient states that it still bothersome to her and hurts when she bears weight.   Patient Active Problem List   Diagnosis Date Noted   MDD (major depressive disorder), recurrent episode, moderate ([place]) 11/03/2021   GAD (generalized anxiety disorder) 11/03/2021   Neck pain 10/28/2021   Cervical high risk HPV (human papillomavirus) test positive 10/17/2021   Irregular heart rhythm 04/15/2021   Numbness on left side 04/15/2021   Dysphagia 03/17/2021   Hypomagnesemia 02/23/2021   Gastroesophageal reflux disease    Hypokalemia 08/24/2020   Atypical chest pain 08/24/2020   Nausea and vomiting  08/24/2020   Hypocalcemia 08/24/2020   DUB (dysfunctional uterine bleeding) 01/12/2017   Fibroid, uterine 01/12/2017     Current Outpatient Medications on File Prior to Visit  Medication Sig Dispense Refill   acetaminophen (TYLENOL) 500 MG tablet Take 1,000 mg by mouth every 6 (six) hours as needed for moderate pain.     AMBULATORY NON FORMULARY MEDICATION 90 ml viscous lidocaine, 90 ml '10mg'$ /5 ml Dicyclomine, 270 ml malox. Take 10 ml twice daily. (Patient taking differently: Take 10 mLs by mouth See admin instructions. 90 ml viscous lidocaine, 90 ml '10mg'$ /5 ml Dicyclomine, 270 ml malox. Take 10 ml twice daily as needed for heartburn) 450 mL 0   ASHWAGANDHA PO Take 2 capsules by mouth 2 (two) times daily as needed (anxiety).     cephALEXin (KEFLEX) 500 MG capsule Take 1 capsule (500 mg total) by mouth 4 (four) times daily. 20 capsule 0   cyclobenzaprine (FLEXERIL) 10 MG tablet Take 1 tablet (10 mg total) by mouth at bedtime as needed for muscle spasms. 30 tablet 1   diclofenac Sodium (VOLTAREN) 1 % GEL Apply 2 g topically 4 (four) times daily. 100 g 0   famotidine (PEPCID) 20 MG tablet Take one tablet twice daily as needed 30 tablet 3   FLUoxetine (PROZAC) 40 MG capsule Take 1 capsule (40 mg total) by mouth daily. (Patient taking differently: Take 40 mg by mouth  daily as needed (depression).) 30 capsule 3   fluticasone (FLONASE) 50 MCG/ACT nasal spray Place 2 sprays into both nostrils daily. (Patient taking differently: Place 2 sprays into both nostrils daily as needed for allergies.) 16 g 0   hydrOXYzine (ATARAX) 25 MG tablet Take 1 tablet (25 mg total) by mouth 3 (three) times daily. (Patient taking differently: Take 25 mg by mouth at bedtime.) 60 tablet 3   lidocaine (LIDODERM) 5 % Place 1 patch onto the skin daily. (Patient taking differently: Place 2 patches onto the skin daily as needed (pain).) 30 patch 3   methimazole (TAPAZOLE) 5 MG tablet Take 1 tablet (5 mg total) by mouth daily. 30  tablet 4   norethindrone (AYGESTIN) 5 MG tablet Take 1 tablet (5 mg total) by mouth 2 (two) times daily. 60 tablet 3   ondansetron (ZOFRAN-ODT) 4 MG disintegrating tablet Take 1 tablet (4 mg total) by mouth every 8 (eight) hours as needed for nausea or vomiting. 20 tablet 0   pantoprazole (PROTONIX) 40 MG tablet TAKE 1 TABLET(40 MG) BY MOUTH TWICE DAILY BEFORE A MEAL (Patient taking differently: Take 40 mg by mouth 2 (two) times daily.) 180 tablet 0   Phenylephrine-Acetaminophen (TYLENOL SINUS+HEADACHE PO) Take 2 tablets by mouth daily as needed (allergies).     potassium chloride (KLOR-CON) 10 MEQ tablet Take 1 tablet (10 mEq total) by mouth daily. 30 tablet 2   pregabalin (LYRICA) 75 MG capsule Take 1 capsule (75 mg total) by mouth 3 (three) times daily. (Patient taking differently: Take 75 mg by mouth 3 (three) times daily as needed (pain).) 90 capsule 1   sucralfate (CARAFATE) 1 g tablet Take 1 tablet (1 g total) by mouth 4 (four) times daily -  with meals and at bedtime. 30 tablet 0   [DISCONTINUED] medroxyPROGESTERone (PROVERA) 10 MG tablet Take 2 tablets by mouth daily (Patient not taking: Reported on 06/05/2019) 60 tablet 0   No current facility-administered medications on file prior to visit.    Allergies  Allergen Reactions   Latex Other (See Comments)    Burn in vaginal area with latex condoms    Social History   Socioeconomic History   Marital status: Single    Spouse name: Not on file   Number of children: 2   Years of education: Not on file   Highest education level: Not on file  Occupational History   Occupation: Best boy: [employer]  Tobacco Use   Smoking status: Never   Smokeless tobacco: Never  Vaping Use   Vaping Use: Never used  Substance and Sexual Activity   Alcohol use: No   Drug use: Not Currently    Types: Marijuana    Comment: quit 2018   Sexual activity: Not Currently    Birth control/protection: Surgical  Other Topics Concern   Not on  file  Social History Narrative   ** Merged History Encounter **       Right Handed  Lives in a one story home    Social Determinants of Health   Financial Resource Strain: Medium Risk (11/03/2021)   Overall Financial Resource Strain (CARDIA)    Difficulty of Paying Living Expenses: Somewhat hard  Food Insecurity: No Food Insecurity (06/16/2021)   Hunger Vital Sign    Worried About Running Out of Food in the Last Year: Never true    [place] in the Last Year: Never true  Transportation Needs: No Transportation Needs (11/03/2021)   PRAPARE -  Hydrologist (Medical): No    Lack of Transportation (Non-Medical): No  Physical Activity: Sufficiently Active (11/03/2021)   Exercise Vital Sign    Days of Exercise per Week: 3 days    Minutes of Exercise per Session: 50 min  Stress: Stress Concern Present (11/03/2021)   [place]    Feeling of Stress : Very much  Social Connections: Moderately Isolated (11/03/2021)   Social Connection and Isolation Panel [NHANES]    Frequency of Communication with Friends and Family: Once a week    Frequency of Social Gatherings with Friends and Family: Once a week    Attends Religious Services: More than 4 times per year    Active Member of [employer] or Organizations: No    Attends Archivist Meetings: Never    Marital Status: Living with partner  Intimate Partner Violence: Not At Risk (11/03/2021)   Humiliation, Afraid, Rape, and Kick questionnaire    Fear of Current or Ex-Partner: No    Emotionally Abused: No    Physically Abused: No    Sexually Abused: No    Family History  Problem Relation Age of Onset   Hypertension Mother    Hypertension Maternal Aunt    Cancer Maternal Grandmother        type unknown   Esophageal cancer Neg Hx    Rectal cancer Neg Hx    Stomach cancer Neg Hx    Colon cancer Neg Hx     Past Surgical History:   Procedure Laterality Date   ENDOMETRIAL ABLATION N/A 04/25/2017   Procedure: ENDOMETRIAL ABLATION With NOVASURE;  Surgeon: Woodroe Mode, MD;  Location: [facility];  Service: Gynecology;  Laterality: N/A;   ESOPHAGEAL MANOMETRY N/A 07/27/2021   Procedure: ESOPHAGEAL MANOMETRY (EM);  Surgeon: Sharyn Creamer, MD;  Location: [facility];  Service: Gastroenterology;  Laterality: N/A;   TUBAL LIGATION  2003    ROS: Review of Systems Negative except as stated above  PHYSICAL EXAM: BP 114/80   Pulse 93   Ht '5\' 6"'$  (1.676 m)   Wt 203 lb 6.4 oz (92.3 kg)   LMP 03/04/2022   SpO2 99%   BMI 32.83 kg/m   Physical Exam  General appearance - alert, well appearing, and in no distress Mental status - normal mood, behavior, speech, dress, motor activity, and thought processes Chest - clear to auscultation, no wheezes, rales or rhonchi, symmetric air entry Heart -regular rate and rhythm with occasional ectopy. Musculoskeletal -right knee: No edema noted.  Mild tenderness along the medial joint line.  Mild discomfort with passive range of motion. Skin -patient has tiny subcutaneous nodule on the right lower leg just above the ankle area.  No erythema or edema.      Latest Ref Rng & Units 02/04/2022    4:23 AM 01/18/2022    3:12 PM 11/28/2021    7:31 AM  CMP  Glucose 70 - 99 mg/dL 94  76  109   BUN 6 - 20 mg/dL '13  9  11   '$ Creatinine 0.44 - 1.00 mg/dL 0.83  0.75  0.83   Sodium 135 - 145 mmol/L 138  138  139   Potassium 3.5 - 5.1 mmol/L 3.0  3.5  3.3   Chloride 98 - 111 mmol/L 108  101  106   CO2 22 - 32 mmol/L '24  23  25   '$ Calcium 8.9 - 10.3 mg/dL 8.8  9.4  8.4   Total Protein 6.5 - 8.1 g/dL 7.1     Total Bilirubin 0.3 - 1.2 mg/dL 0.4     Alkaline Phos 38 - 126 U/L 95     AST 15 - 41 U/L 14     ALT 0 - 44 U/L 12      Lipid Panel  No results found for: "CHOL", "TRIG", "HDL", "CHOLHDL", "VLDL", "LDLCALC", "LDLDIRECT"  CBC    Component Value Date/Time   WBC 9.7  02/04/2022 0423   RBC 4.42 02/04/2022 0423   HGB 12.8 02/04/2022 0423   HCT 37.8 02/04/2022 0423   PLT 244 02/04/2022 0423   MCV 85.5 02/04/2022 0423   MCH 29.0 02/04/2022 0423   MCHC 33.9 02/04/2022 0423   RDW 12.9 02/04/2022 0423   LYMPHSABS 3.6 05/12/2021 2219   MONOABS 0.7 05/12/2021 2219   EOSABS 0.3 05/12/2021 2219   BASOSABS 0.1 05/12/2021 2219    ASSESSMENT AND PLAN: 1. Hypokalemia We will recheck BMP today.  If potassium level is still running low, she may need to have renin and aldosterone ratio checked. - Basic Metabolic Panel; Future  2. Palpitations - Basic Metabolic Panel; Future - TSH+T4F+T3Free; Future  3. Acute pain of right knee - Ambulatory referral to Orthopedic Surgery - naproxen (NAPROSYN) 500 MG tablet; Take 1 tablet (500 mg total) by mouth 2 (two) times daily as needed.  Dispense: 30 tablet; Refill: 1  Advised to observe the spot on the right lower leg and to apply heat.   Patient was given the opportunity to ask questions.  Patient verbalized understanding of the plan and was able to repeat key elements of the plan.   This documentation was completed using Radio producer.  Any transcriptional errors are unintentional.  Orders Placed This Encounter  Procedures   Basic Metabolic Panel   [id number]   Ambulatory referral to Orthopedic Surgery     Requested Prescriptions   Signed Prescriptions Disp Refills   naproxen (NAPROSYN) 500 MG tablet 30 tablet 1    Sig: Take 1 tablet (500 mg total) by mouth 2 (two) times daily as needed.    No follow-ups on file.  Karle Plumber, MD, FACP

## 2022-03-22 ENCOUNTER — Encounter: Payer: Self-pay | Admitting: Internal Medicine

## 2022-03-22 ENCOUNTER — Other Ambulatory Visit (HOSPITAL_COMMUNITY): Payer: Self-pay

## 2022-03-22 ENCOUNTER — Ambulatory Visit: Payer: Commercial Managed Care - HMO | Admitting: Internal Medicine

## 2022-03-22 ENCOUNTER — Other Ambulatory Visit: Payer: Self-pay

## 2022-03-22 VITALS — BP 110/68 | HR 61 | Ht 66.0 in | Wt 205.0 lb

## 2022-03-22 DIAGNOSIS — E0590 Thyrotoxicosis, unspecified without thyrotoxic crisis or storm: Secondary | ICD-10-CM | POA: Insufficient documentation

## 2022-03-22 DIAGNOSIS — E042 Nontoxic multinodular goiter: Secondary | ICD-10-CM | POA: Insufficient documentation

## 2022-03-22 LAB — TSH: TSH: 0.7 u[IU]/mL (ref 0.35–5.50)

## 2022-03-22 LAB — T4, FREE: Free T4: 0.74 ng/dL (ref 0.60–1.60)

## 2022-03-22 MED ORDER — METHIMAZOLE 5 MG PO TABS: 5.0000 mg | ORAL_TABLET | Freq: Every day | ORAL | 2 refills | Status: DC

## 2022-03-22 NOTE — Progress Notes (Signed)
Name: Laurie Bell  MRN/ DOB: [id number], [birth year]    Age/ Sex: 41 y.o., female    PCP: Charlott Rakes, MD   Reason for Endocrinology Evaluation: Low TSH     Date of Initial Endocrinology Evaluation: 12/09/2021    HPI: Laurie Bell is a 41 y.o. female with a past medical history of. The patient presented for initial endocrinology clinic visit on 12/09/2021 for consultative assistance with her low TSH.   Patient has been noted with low TSH since September 2012 with a TSH at 0.129u IU/mL, with normal free T4.  TRAb was undetectable Thyroid ultrasound on 12/23/2021 revealed multinodular goiter with a right superior 1.5 cm nodule meeting FNA criteria  We opted to stop methimazole due to multiple symptoms 11/2021 Mother and maternal aunt with thyroid disease   SUBJECTIVE:    Today (03/22/22):  Laurie Bell is here for a follow up on MNG and subclinical hyperthyroidism  The patient has not had her FNA of the right superior nodule yet because she declined due to extreme fear of needles  She was recently married 2 weeks ago   She denies local neck swelling   Has occasional constipation  Has chronic palpitations  Has palpitations  No Biotin intake     Methimazole 5 mg daily  HISTORY:  Past Medical History:  Past Medical History:  Diagnosis Date   Anxiety    Arthritis    Chlamydia contact, treated    GERD (gastroesophageal reflux disease)    Hyperthyroidism    Past Surgical History:  Past Surgical History:  Procedure Laterality Date   ENDOMETRIAL ABLATION N/A 04/25/2017   Procedure: ENDOMETRIAL ABLATION With NOVASURE;  Surgeon: Woodroe Mode, MD;  Location: [facility];  Service: Gynecology;  Laterality: N/A;   ESOPHAGEAL MANOMETRY N/A 07/27/2021   Procedure: ESOPHAGEAL MANOMETRY (EM);  Surgeon: Sharyn Creamer, MD;  Location: [facility];  Service: Gastroenterology;  Laterality: N/A;   TUBAL LIGATION  2003    Social History:  reports  that she has never smoked. She has never used smokeless tobacco. She reports that she does not currently use drugs after having used the following drugs: Marijuana. She reports that she does not drink alcohol. Family History: family history includes Cancer in her maternal grandmother; Hypertension in her maternal aunt and mother.   HOME MEDICATIONS: Allergies as of 03/22/2022       Reactions   Latex Other (See Comments)   Burn in vaginal area with latex condoms        Medication List        Accurate as of March 22, 2022  1:05 PM. If you have any questions, ask your nurse or doctor.          STOP taking these medications    cyclobenzaprine 10 MG tablet Commonly known as: FLEXERIL Stopped by: Dorita Sciara, MD   metroNIDAZOLE 500 MG tablet Commonly known as: FLAGYL Stopped by: Dorita Sciara, MD       TAKE these medications    acetaminophen 500 MG tablet Commonly known as: TYLENOL Take 1,000 mg by mouth every 6 (six) hours as needed for moderate pain.   AMBULATORY NON FORMULARY MEDICATION 90 ml viscous lidocaine, 90 ml '10mg'$ /5 ml Dicyclomine, 270 ml malox. Take 10 ml twice daily. What changed:  how much to take how to take this when to take this additional instructions   ASHWAGANDHA PO Take 2 capsules by mouth 2 (two) times daily as  needed (anxiety).   cephALEXin 500 MG capsule Commonly known as: KEFLEX Take 1 capsule (500 mg total) by mouth 4 (four) times daily.   diclofenac Sodium 1 % Gel Commonly known as: Voltaren Apply 2 g topically 4 (four) times daily.   famotidine 20 MG tablet Commonly known as: PEPCID Take one tablet twice daily as needed   FLUoxetine 40 MG capsule Commonly known as: PROzac Take 1 capsule (40 mg total) by mouth daily. What changed:  when to take this reasons to take this   fluticasone 50 MCG/ACT nasal spray Commonly known as: FLONASE Place 2 sprays into both nostrils daily. What changed:  when to take  this reasons to take this   hydrOXYzine 25 MG tablet Commonly known as: ATARAX Take 1 tablet (25 mg total) by mouth 3 (three) times daily. What changed: when to take this   lidocaine 5 % Commonly known as: Lidoderm Place 1 patch onto the skin daily. What changed:  how much to take when to take this reasons to take this   methimazole 5 MG tablet Commonly known as: TAPAZOLE Take 1 tablet (5 mg total) by mouth daily.   naproxen 500 MG tablet Commonly known as: Naprosyn Take 1 tablet (500 mg total) by mouth 2 (two) times daily as needed.   norethindrone 5 MG tablet Commonly known as: AYGESTIN Take 1 tablet (5 mg total) by mouth 2 (two) times daily.   ondansetron 4 MG disintegrating tablet Commonly known as: ZOFRAN-ODT Take 1 tablet (4 mg total) by mouth every 8 (eight) hours as needed for nausea or vomiting.   pantoprazole 40 MG tablet Commonly known as: PROTONIX TAKE 1 TABLET(40 MG) BY MOUTH TWICE DAILY BEFORE A MEAL What changed: See the new instructions.   potassium chloride 10 MEQ tablet Commonly known as: KLOR-CON Take 1 tablet (10 mEq total) by mouth daily.   pregabalin 75 MG capsule Commonly known as: LYRICA Take 1 capsule (75 mg total) by mouth 3 (three) times daily. What changed:  when to take this reasons to take this   sucralfate 1 g tablet Commonly known as: Carafate Take 1 tablet (1 g total) by mouth 4 (four) times daily -  with meals and at bedtime.   TYLENOL SINUS+HEADACHE PO Take 2 tablets by mouth daily as needed (allergies).          REVIEW OF SYSTEMS: A comprehensive ROS was conducted with the patient and is negative except as per HPI    OBJECTIVE:  VS: BP 110/68 (BP Location: Left Arm, Patient Position: Sitting, Cuff Size: Small)   Pulse 61   Ht '5\' 6"'$  (1.676 m)   Wt 205 lb (93 kg)   LMP 03/04/2022   SpO2 99%   BMI 33.09 kg/m    Wt Readings from Last 3 Encounters:  03/22/22 205 lb (93 kg)  03/20/22 203 lb 6.4 oz (92.3 kg)   03/14/22 201 lb 6.4 oz (91.4 kg)     EXAM: General: Pt appears well and is in NAD  Neck: General: Supple without adenopathy. Thyroid: Thyroid is prominent.  No goiter or nodules appreciated.   Lungs: Clear with good BS bilat with no rales, rhonchi, or wheezes  Heart: Auscultation: RRR.  Abdomen: Normoactive bowel sounds, soft, nontender, without masses or organomegaly palpable  Extremities:  BL LE: No pretibial edema normal ROM and strength.  Mental Status: Judgment, insight: Intact Orientation: Oriented to time, place, and person Mood and affect: No depression, anxiety, or agitation  DATA REVIEWED:     Latest Reference Range & Units 03/22/22 11:09  TSH 0.35 - 5.50 uIU/mL 0.70  T4,Free(Direct) 0.60 - 1.60 ng/dL 0.74      Latest Reference Range & Units 11/28/21 07:31  Sodium 135 - 145 mmol/L 139  Potassium 3.5 - 5.1 mmol/L 3.3 (L)  Chloride 98 - 111 mmol/L 106  CO2 22 - 32 mmol/L 25  Glucose 70 - 99 mg/dL 109 (H)  BUN 6 - 20 mg/dL 11  Creatinine 0.44 - 1.00 mg/dL 0.83  Calcium 8.9 - 10.3 mg/dL 8.4 (L)  Anion gap 5 - 15  8  GFR, Estimated >60 mL/min >60   Thyroid ultrasound 12/23/2021   Estimated total number of nodules >/= 1 cm: 2   Number of spongiform nodules >/=  2 cm not described below (TR1): 0   Number of mixed cystic and solid nodules >/= 1.5 cm not described below (TR2): 0   _________________________________________________________   Nodule # 1:   Location: Right; Superior   Maximum size: 1.5 cm; Other 2 dimensions: 1.3 x 1.1 cm   Composition: solid/almost completely solid (2)   Echogenicity: hypoechoic (2)   Shape: not taller-than-wide (0)   Margins: lobulated/irregular (2)   Echogenic foci: macrocalcifications (1)   ACR TI-RADS total points: 7.   ACR TI-RADS risk category: TR5 (>/= 7 points).   ACR TI-RADS recommendations:   **Given size (>/= 1.0 cm) and appearance, fine needle aspiration of this highly suspicious nodule should  be considered based on TI-RADS criteria.   _________________________________________________________   Nodule # 3: 1 cm mixed cystic and solid nodule in the right inferior gland. This lesion does not meet criteria to warrant further evaluation.   Additional small subcentimeter thyroid nodules noted bilaterally. These also do not meet criteria for further evaluation.   IMPRESSION: 1. Approximally 1.5 cm TI-RADS 5 nodule in the right superior gland meets criteria to consider fine-needle aspiration biopsy. Biopsy is recommended. 2. Additional small nodules noted incidentally which do not meet criteria for further evaluation.   ASSESSMENT/PLAN/RECOMMENDATIONS:   Subclinical Hyperthyroidism:  -She was started on methimazole in June 2023 due to multiple symptoms, TFTs normalized on repeat labs in August -Patiently today tells me she was without methimazole for approximately a week, but she has been back on it for the past week -TFTs are normal, no change   Medications : Continue methimazole 5 mg daily     2. Multinodular Goiter:  - No local neck symptoms  -Thyroid ultrasound on 12/23/2021 revealed multinodular goiter with a right superior 1.5 cm nodule meeting FNA criteria, patient declined FNA due to being very fearful of needles -We discussed serial monitoring of the nodule and having low threshold for hemithyroidectomy with increase in size - Will repeat ultrasound 12/2022    Follow-up in 6 months  Signed electronically by: Mack Guise, MD  [facility] Endocrinology  [facility] [address],  [address] Phone: [phone] FAX: [phone]   CC: Charlott Rakes, MD [place] [address] Phone: [phone] Fax: [phone]   Return to Endocrinology clinic as below: Future Appointments  Date Time Provider [place]  03/29/2022  9:30 AM [facility] US OB 1 [facility]   03/29/2022 10:15 AM Megan Salon, MD [facility]  03/30/2022  3:00 PM Elba Barman, DO [facility] None  04/11/2022  4:00 PM Dory Horn, LCSW [facility] None  04/28/2022  3:00 PM Kirsteins, Luanna Salk, MD [facility]  [facility]  05/25/2022  8:00 AM [facility] LAB [facility] None  07/24/2022  4:10 PM Charlott Rakes, MD [facility] None  09/26/2022  7:30 AM Jaila Schellhorn, Melanie Crazier, MD [facility] None

## 2022-03-28 ENCOUNTER — Ambulatory Visit: Payer: 59 | Admitting: Family Medicine

## 2022-03-29 ENCOUNTER — Encounter (HOSPITAL_BASED_OUTPATIENT_CLINIC_OR_DEPARTMENT_OTHER): Payer: Self-pay | Admitting: Obstetrics & Gynecology

## 2022-03-29 ENCOUNTER — Ambulatory Visit (INDEPENDENT_AMBULATORY_CARE_PROVIDER_SITE_OTHER): Payer: Commercial Managed Care - HMO

## 2022-03-29 ENCOUNTER — Telehealth (INDEPENDENT_AMBULATORY_CARE_PROVIDER_SITE_OTHER): Payer: Commercial Managed Care - HMO | Admitting: Obstetrics & Gynecology

## 2022-03-29 VITALS — BP 118/71 | HR 96 | Ht 66.0 in | Wt 205.8 lb

## 2022-03-29 DIAGNOSIS — D250 Submucous leiomyoma of uterus: Secondary | ICD-10-CM | POA: Diagnosis not present

## 2022-03-29 DIAGNOSIS — N926 Irregular menstruation, unspecified: Secondary | ICD-10-CM | POA: Diagnosis not present

## 2022-03-29 DIAGNOSIS — N83201 Unspecified ovarian cyst, right side: Secondary | ICD-10-CM | POA: Diagnosis not present

## 2022-03-29 DIAGNOSIS — N83209 Unspecified ovarian cyst, unspecified side: Secondary | ICD-10-CM | POA: Diagnosis not present

## 2022-03-30 ENCOUNTER — Ambulatory Visit (INDEPENDENT_AMBULATORY_CARE_PROVIDER_SITE_OTHER): Payer: Commercial Managed Care - HMO

## 2022-03-30 ENCOUNTER — Ambulatory Visit (INDEPENDENT_AMBULATORY_CARE_PROVIDER_SITE_OTHER): Payer: Commercial Managed Care - HMO | Admitting: Sports Medicine

## 2022-03-30 ENCOUNTER — Encounter: Payer: Self-pay | Admitting: Sports Medicine

## 2022-03-30 VITALS — Ht 66.0 in | Wt 200.0 lb

## 2022-03-30 DIAGNOSIS — M25561 Pain in right knee: Secondary | ICD-10-CM

## 2022-03-30 DIAGNOSIS — M79661 Pain in right lower leg: Secondary | ICD-10-CM | POA: Diagnosis not present

## 2022-03-30 DIAGNOSIS — L52 Erythema nodosum: Secondary | ICD-10-CM

## 2022-03-30 MED ORDER — PREDNISONE 20 MG PO TABS: 20.0000 mg | ORAL_TABLET | Freq: Two times a day (BID) | ORAL | 0 refills | Status: AC

## 2022-03-30 NOTE — Progress Notes (Signed)
Right knee pain; 1.5 weeks ago Right lower leg; 2 weeks ago (bump on shin) Went to Urgent Care for this issue  Tylenol for pain; not much relief Does complain of some knee swelling

## 2022-03-30 NOTE — Progress Notes (Signed)
Laurie Bell - 41 y.o. female MRN [id number]  Date of birth: May 06, 1981  Office Visit Note: Visit Date: 03/30/2022 PCP: Charlott Rakes, MD Referred by: Ladell Pier, MD  Subjective: Chief Complaint  Patient presents with   Right Knee - Pain   Right Leg - Pain   HPI: Laurie Bell is a pleasant 41 y.o. female who presents today for right shin and right leg pain.  Denies any specific injury.  Her pain is localized to the distal anterior shin, and does have overlying skin changes in this region.  She was seen in the urgent care for this on 02/06/2022 and they thought it was possibly a bug bite or a contusion, however her pain in the skin discoloration has remained.  This is tender to the touch, and she feels like there is some swelling around this area.  Her pain radiates up and down the shin and sometimes into the knee and into the ankle.  Reports intermittent swelling as well.  She denies any personal or family history of known rheumatologic conditions.  This has been present for few months, although her pain has worsened slightly and does cause some interference with daily activities.  Again she denies any known trauma.  It is worse with standing for longer periods of time.  Pertinent ROS were reviewed with the patient and found to be negative unless otherwise specified above in HPI.   Assessment & Plan: Visit Diagnoses:  1. Pain in right shin   2. Acute pain of right knee   3. Erythema nodosum    Plan: Discussed with Yittel the possible etiology of her pain, at this point her diagnosis is somewhat uncertain.  Given that she does have skin hypopigmentation and tender distal shin, I am suspicious for erythema nodosum.  She does not have significant joint effusions today, but does state that both the knee and the ankle swell occasionally.  Review of her prior labs show a normal CBC, although I do not see inflammatory markers.  For symptomatic relief, we will give her  prednisone 20 mg twice daily for the next 6 days.  If this is in fact an inflammatory condition, would expect this to get much better with this.  I would like to send her to dermatology, referral sent today, for a skin biopsy to determine if the skin hypopigmentation is in fact erythema nodosum or an alternative pathology.  We will follow-up with me or her PCP following these biopsy results.  Follow-up: F/u depending on dermatology results   Meds & Orders:  Meds ordered this encounter  Medications   predniSONE (DELTASONE) 20 MG tablet    Sig: Take 1 tablet (20 mg total) by mouth 2 (two) times daily with a meal for 6 days.    Dispense:  12 tablet    Refill:  0    Orders Placed This Encounter  Procedures   XR KNEE 3 VIEW RIGHT   XR Tibia/Fibula Right   Ambulatory referral to Dermatology     Procedures: No procedures performed      Clinical History: No specialty comments available.  She reports that she has never smoked. She has never used smokeless tobacco.  Recent Labs    01/18/22 1512  HGBA1C 5.6    Objective:   Vital Signs: Ht '5\' 6"'$  (1.676 m)   Wt 200 lb (90.7 kg)   LMP 03/04/2022   BMI 32.28 kg/m   Physical Exam  Gen: Well-appearing, in no acute distress;  non-toxic CV: Regular Rate. Well-perfused. Warm.  Resp: Breathing unlabored on room air; no wheezing. Psych: Fluid speech in conversation; appropriate affect; normal thought process Neuro: Sensation intact throughout. No gross coordination deficits.   Ortho Exam - Right knee: No effusion, no joint line tenderness.  Full range of motion from 0-135 degrees.  No ligamental laxity.  No varus or valgus instability.  Strength 5/5 throughout in all directions.  - Right shin: The distal anterior shin has a 3 x 3 cm skin lesion of hypopigmentation that is tender to palpation.  There are some nodules palpable just around this location as well.  There is full range of motion about the ankle.  No TTP palpating down the tibia  itself.  Imaging: XR Tibia/Fibula Right  Result Date: 03/30/2022 2 views of the right tibia and fibula including AP and lateral femoral ordered and reviewed by myself.  No acute fracture or osseous abnormality.  Possible soft tissue swelling of the distal medial shin on the AP view versus artifact from Achilles tendon.  XR KNEE 3 VIEW RIGHT  Result Date: 03/30/2022 3 views of the knee including AP, lateral and sunrise views were ordered and reviewed by myself.  X-rays demonstrate no significant arthritic change or bony abnormality.  No acute fracture.  There is a mild degree of patella alta.   Past Medical/Family/Surgical/Social History: Medications & Allergies reviewed per EMR, new medications updated. Patient Active Problem List   Diagnosis Date Noted   Multinodular goiter 03/22/2022   Subclinical hyperthyroidism 03/22/2022   MDD (major depressive disorder), recurrent episode, moderate ([place]) 11/03/2021   GAD (generalized anxiety disorder) 11/03/2021   Neck pain 10/28/2021   Cervical high risk HPV (human papillomavirus) test positive 10/17/2021   Irregular heart rhythm 04/15/2021   Numbness on left side 04/15/2021   Dysphagia 03/17/2021   Hypomagnesemia 02/23/2021   Gastroesophageal reflux disease    Hypokalemia 08/24/2020   Atypical chest pain 08/24/2020   Nausea and vomiting 08/24/2020   Hypocalcemia 08/24/2020   DUB (dysfunctional uterine bleeding) 01/12/2017   Fibroid, uterine 01/12/2017   Past Medical History:  Diagnosis Date   Anxiety    Arthritis    Chlamydia contact, treated    GERD (gastroesophageal reflux disease)    Hyperthyroidism    Family History  Problem Relation Age of Onset   Hypertension Mother    Hypertension Maternal Aunt    Cancer Maternal Grandmother        type unknown   Esophageal cancer Neg Hx    Rectal cancer Neg Hx    Stomach cancer Neg Hx    Colon cancer Neg Hx    Past Surgical History:  Procedure Laterality Date   ENDOMETRIAL  ABLATION N/A 04/25/2017   Procedure: ENDOMETRIAL ABLATION With NOVASURE;  Surgeon: Woodroe Mode, MD;  Location: [facility];  Service: Gynecology;  Laterality: N/A;   ESOPHAGEAL MANOMETRY N/A 07/27/2021   Procedure: ESOPHAGEAL MANOMETRY (EM);  Surgeon: Sharyn Creamer, MD;  Location: [facility];  Service: Gastroenterology;  Laterality: N/A;   TUBAL LIGATION  2003   Social History   Occupational History   Occupation: Best boy: [employer]  Tobacco Use   Smoking status: Never   Smokeless tobacco: Never  Vaping Use   Vaping Use: Never used  Substance and Sexual Activity   Alcohol use: No   Drug use: Not Currently    Types: Marijuana    Comment: quit 2018   Sexual activity: Not Currently  Birth control/protection: Surgical

## 2022-04-01 NOTE — Progress Notes (Signed)
GYNECOLOGY  VISIT  CC:   follow up and discuss ultrasound findings  HPI: 41 y.o. G64P2000 Single Black or African American female here for follow up of irregular and heavy bleeding at times.  Pt had ultrasound done in June showing submucosal fibroid that was about 3 cm.  As well, a 3.7cm simple ovarian cyst was noted.  Shew as started on aygestin but pt states she only took it for a day and then lost the medication.  Had a lot going on and so didn't call to see if could get a refill.  However, there was a prescription refill on file.  She is aware of this now.  Pt has hx of endometrial ablation.  Not sure IUD placement is possible because of this.  Also, she does not want Depo Provera or Nexplanon therapy.  She is not interested in birth control pills either.  She would like to retry the progesterone to see how can help with bleeding.  Not really interested in surgery either.  We did review ultrasound findings from today as well.  Uterus is a little larger compared to prior imaging.  Submucosal fibroid measures larger today as well.  Probable adenomyosis preset.  These two findings are both possible causes of failure of endometrial ablation.  Simple right ovarian cyst appears to either be smaller or gone.  There is a hemorrhagic cyst now present on the left ovary.  Given this was not present in June, do not feel this needs additional follow up at this time.   Past Medical History:  Diagnosis Date   Anxiety    Arthritis    Chlamydia contact, treated    GERD (gastroesophageal reflux disease)    Hyperthyroidism     MEDS:   Current Outpatient Medications on File Prior to Visit  Medication Sig Dispense Refill   acetaminophen (TYLENOL) 500 MG tablet Take 1,000 mg by mouth every 6 (six) hours as needed for moderate pain.     ASHWAGANDHA PO Take 2 capsules by mouth 2 (two) times daily as needed (anxiety).     diclofenac Sodium (VOLTAREN) 1 % GEL Apply 2 g topically 4 (four) times daily. 100 g 0    famotidine (PEPCID) 20 MG tablet Take one tablet twice daily as needed 30 tablet 3   FLUoxetine (PROZAC) 40 MG capsule Take 1 capsule (40 mg total) by mouth daily. (Patient taking differently: Take 40 mg by mouth daily as needed (depression).) 30 capsule 3   fluticasone (FLONASE) 50 MCG/ACT nasal spray Place 2 sprays into both nostrils daily. (Patient taking differently: Place 2 sprays into both nostrils daily as needed for allergies.) 16 g 0   hydrOXYzine (ATARAX) 25 MG tablet Take 1 tablet (25 mg total) by mouth 3 (three) times daily. (Patient taking differently: Take 25 mg by mouth at bedtime.) 60 tablet 3   lidocaine (LIDODERM) 5 % Place 1 patch onto the skin daily. (Patient taking differently: Place 2 patches onto the skin daily as needed (pain).) 30 patch 3   methimazole (TAPAZOLE) 5 MG tablet Take 1 tablet (5 mg total) by mouth daily. 90 tablet 2   naproxen (NAPROSYN) 500 MG tablet Take 1 tablet (500 mg total) by mouth 2 (two) times daily as needed. 30 tablet 1   ondansetron (ZOFRAN-ODT) 4 MG disintegrating tablet Take 1 tablet (4 mg total) by mouth every 8 (eight) hours as needed for nausea or vomiting. 20 tablet 0   pantoprazole (PROTONIX) 40 MG tablet TAKE 1 TABLET(40 MG) BY  MOUTH TWICE DAILY BEFORE A MEAL (Patient taking differently: Take 40 mg by mouth 2 (two) times daily.) 180 tablet 0   Phenylephrine-Acetaminophen (TYLENOL SINUS+HEADACHE PO) Take 2 tablets by mouth daily as needed (allergies).     potassium chloride (KLOR-CON) 10 MEQ tablet Take 1 tablet (10 mEq total) by mouth daily. 30 tablet 2   pregabalin (LYRICA) 75 MG capsule Take 1 capsule (75 mg total) by mouth 3 (three) times daily. (Patient taking differently: Take 75 mg by mouth 3 (three) times daily as needed (pain).) 90 capsule 1   AMBULATORY NON FORMULARY MEDICATION 90 ml viscous lidocaine, 90 ml '10mg'$ /5 ml Dicyclomine, 270 ml malox. Take 10 ml twice daily. (Patient not taking: Reported on 03/29/2022) 450 mL 0   norethindrone  (AYGESTIN) 5 MG tablet Take 1 tablet (5 mg total) by mouth 2 (two) times daily. (Patient not taking: Reported on 03/29/2022) 60 tablet 3   [DISCONTINUED] medroxyPROGESTERone (PROVERA) 10 MG tablet Take 2 tablets by mouth daily (Patient not taking: Reported on 06/05/2019) 60 tablet 0   No current facility-administered medications on file prior to visit.    ALLERGIES: Latex  SH:  single, non smoker  Review of Systems  Constitutional: Negative.     PHYSICAL EXAMINATION:    BP 118/71 (BP Location: Left Arm, Patient Position: Sitting, Cuff Size: Large)   Pulse 96   Ht '5\' 6"'$  (1.676 m) Comment: Reported  Wt 205 lb 12.8 oz (93.4 kg)   LMP 03/04/2022   BMI 33.22 kg/m     No exam performed today  Assessment/Plan: 1. Irregular bleeding - pt is going to start norethindrone '5mg'$  daily.  Rx should still be on file for pt.  She will get this and start.  Plan follow up 3 months for recheck if she tolerates this  2. Fibroids, submucosal - slightly enlarged from prior ultrasound.  Will repeat imaging if bleeding pattern worsens or uterus is enlarged on exam.  3. Hemorrhagic ovarian cyst - as not present on prior ultrasound in June, do not feel additional imaging

## 2022-04-04 ENCOUNTER — Telehealth (HOSPITAL_BASED_OUTPATIENT_CLINIC_OR_DEPARTMENT_OTHER): Payer: Self-pay | Admitting: *Deleted

## 2022-04-04 NOTE — Telephone Encounter (Signed)
LMOVM for pt to call office regarding medication question.

## 2022-04-05 ENCOUNTER — Telehealth (HOSPITAL_BASED_OUTPATIENT_CLINIC_OR_DEPARTMENT_OTHER): Payer: Self-pay | Admitting: *Deleted

## 2022-04-05 NOTE — Telephone Encounter (Signed)
-----   Message from Megan Salon, MD sent at 04/01/2022  5:41 PM EDT ----- Regarding: follow up Laurie Bell, This is a pt I saw last week for ultrasound.  She had not taken the progesterone I prescribed in June.  She was going to see if could just get from pharmacy.  Can you see if she did get the prescription and if she's started it.  I would like to see her again  3 months for recheck once she starts it.  Thanks.  [name]

## 2022-04-05 NOTE — Telephone Encounter (Signed)
LMOVM for pt to call regarding prescription

## 2022-04-06 ENCOUNTER — Telehealth: Payer: Self-pay | Admitting: Internal Medicine

## 2022-04-06 NOTE — Telephone Encounter (Signed)
Patient complains that her stomach has been bothering her a little the last few days (she was recently on prednisone) She is taking her PPI as ordered. Patient asks to have PRN GI cocktail. She used that at an ER visit for the same symptoms. Found it to be helpful. Please advise.  No vomiting or bloody , tarry stools.

## 2022-04-06 NOTE — Telephone Encounter (Signed)
Patient called states she would like a call back from a nurse. States she needs to know certain things about her medication. Please call  to advise.

## 2022-04-07 ENCOUNTER — Other Ambulatory Visit: Payer: Self-pay

## 2022-04-07 MED ORDER — AMBULATORY NON FORMULARY MEDICATION: 0 refills | Status: DC

## 2022-04-07 NOTE — Telephone Encounter (Signed)
Patient instructed. Rx printed and faxed to [facility].

## 2022-04-11 ENCOUNTER — Ambulatory Visit (HOSPITAL_COMMUNITY): Payer: Commercial Managed Care - HMO | Admitting: Licensed Clinical Social Worker

## 2022-04-11 ENCOUNTER — Telehealth: Payer: Self-pay | Admitting: Internal Medicine

## 2022-04-11 NOTE — Telephone Encounter (Signed)
Patient called states that her lidocaine mix drink is not at the pharmacy. States the pharmacy on [address] claimed they don't have an order in. Requesting a call back.

## 2022-04-13 ENCOUNTER — Telehealth (HOSPITAL_BASED_OUTPATIENT_CLINIC_OR_DEPARTMENT_OTHER): Payer: Self-pay | Admitting: *Deleted

## 2022-04-13 NOTE — Telephone Encounter (Signed)
-----   Message from Megan Salon, MD sent at 04/01/2022  5:41 PM EDT ----- Regarding: follow up Laurie Bell, This is a pt I saw last week for ultrasound.  She had not taken the progesterone I prescribed in June.  She was going to see if could just get from pharmacy.  Can you see if she did get the prescription and if she's started it.  I would like to see her again  3 months for recheck once she starts it.  Thanks.  [name]

## 2022-04-13 NOTE — Telephone Encounter (Signed)
DPR reviewed. LMOVM asking if pt has started taking progesterone that she was prescribed. Advised that if she had, to please call the office to set up a 3 month follow up appt.

## 2022-04-28 ENCOUNTER — Encounter: Payer: Commercial Managed Care - HMO | Admitting: Physical Medicine & Rehabilitation

## 2022-05-08 ENCOUNTER — Telehealth: Payer: Self-pay | Admitting: Internal Medicine

## 2022-05-08 NOTE — Telephone Encounter (Signed)
Pt is calling states she is having severe abdominal pain but does not want to wait until January to be seen is wondering if there is anything she could do that would help. Please advise

## 2022-05-08 NOTE — Telephone Encounter (Signed)
Complaining of left sided mid abdominal pain that comes and goes. Seems to be more often now. She reports this has been happening for 2 to 3 weeks. She does not feel any relief from the GI cocktail and stopped using it about 3 days ago. Daily soft bowel movements at least twice daily. Feels like she is burping a lot.

## 2022-05-09 NOTE — Telephone Encounter (Signed)
Patient not able to come to 05/11/22 appointment. Scheduled for 05/19/22 at 1:30 for evaluation of the abdominal pain. No sooner openings but I will monitor the schedule for cancellations.

## 2022-05-09 NOTE — Telephone Encounter (Signed)
Called patient. Line clicked then went dead. Waited for 2 or 3 minutes. Called again. No answer. Left message on the voicemail. Offered 05/11/22 at 1:30 with Dr Lorenso Courier. Dr Lorenso Courier requests the patient come in for evaluation and treatment.

## 2022-05-10 ENCOUNTER — Other Ambulatory Visit: Payer: Self-pay | Admitting: Physical Medicine & Rehabilitation

## 2022-05-10 ENCOUNTER — Other Ambulatory Visit: Payer: Self-pay | Admitting: Family Medicine

## 2022-05-19 ENCOUNTER — Encounter: Payer: Self-pay | Admitting: Internal Medicine

## 2022-05-19 ENCOUNTER — Ambulatory Visit (INDEPENDENT_AMBULATORY_CARE_PROVIDER_SITE_OTHER): Payer: Commercial Managed Care - HMO | Admitting: Internal Medicine

## 2022-05-19 VITALS — BP 110/76 | HR 94 | Ht 66.0 in | Wt 207.0 lb

## 2022-05-19 DIAGNOSIS — R109 Unspecified abdominal pain: Secondary | ICD-10-CM | POA: Diagnosis not present

## 2022-05-19 DIAGNOSIS — K219 Gastro-esophageal reflux disease without esophagitis: Secondary | ICD-10-CM

## 2022-05-19 MED ORDER — ESOMEPRAZOLE MAGNESIUM 40 MG PO CPDR: 40.0000 mg | DELAYED_RELEASE_CAPSULE | Freq: Two times a day (BID) | ORAL | 5 refills | Status: DC

## 2022-05-19 MED ORDER — LINACLOTIDE 145 MCG PO CAPS: 145.0000 ug | ORAL_CAPSULE | Freq: Every day | ORAL | 5 refills | Status: AC

## 2022-05-19 NOTE — Patient Instructions (Addendum)
We have sent the following medications to your pharmacy for you to pick up at your convenience:  Start Nexium 40 mg 2 times daily Take Linzess 145 mcg daily before breakfast  Please purchase the following medications over the counter and take as directed:  Start benefiber daily   If you are age 41 or older, your body mass index should be between 23-30. Your Body mass index is 33.41 kg/m. If this is out of the aforementioned range listed, please consider follow up with your Primary Care Provider.  If you are age 34 or younger, your body mass index should be between 19-25. Your Body mass index is 33.41 kg/m. If this is out of the aformentioned range listed, please consider follow up with your Primary Care Provider.   ________________________________________________________  The [facility] providers would like to encourage you to use [facility] to communicate with providers for non-urgent requests or questions.  Due to long hold times on the telephone, sending your provider a message by [facility] may be a faster and more efficient way to get a response.  Please allow 48 business hours for a response.  Please remember that this is for non-urgent requests.  _______________________________________________________   The BravoT capsule test is a noninvasive test for evaluating heartburn or reflux symptoms related to gastroesophageal reflux disease (GERD). Damage caused by GERD can lead to more serious medical problems such as difficulty swallowing (dysphagia), narrowing of the esophagus (strictures), and Barrett's esophagus.  This test involves inserting a capsule the size of a long gel cap into the esophagus to measure the pH environment. Higher levels of pH in the esophagus indicate the presence of acid reflux. Your doctor will analyze results from the [place] test to determine what is causing your symptoms and which treatment to prescribe for you.  Patients with pacemakers, cardiac defibrillators, or  diagnosed gastrointestinal obstructions or strictures should inform your physician.   Throughout the test period, which lasts 48 to 72 hours, the Bravo capsule will measure the pH in your esophagus and transmit this information to the Bravo reflux recorder, a small device that you will wear on your belt or waistband as you would a mobile phone. The capsule communicates with the recorder wirelessly, meaning that no tube or wire remains in your nose or throat.  The Bravo capsule not only measures the degree of acidity during the test period but also how often stomach acid flows into the lower esophagus.  After the capsule has been placed, you can  go about your normal activities. Some patients can feel the presence of the capsule, some do not. You will also be given a diary to note when you have reflux symptoms, when you eat and drink, and when you sleep or lie down.  Once the test has been completed, you will return the recorder and diary to the [facility]. The data is then downloaded to a computer program, which provides a comprehensive report. Your GI doctor will analyze the information and your symptoms to determining if you have acid reflux.  A day or two after the test is completed, the disposable capsule falls off the wall of your esophagus, harmlessly passes through your digestive tract, and is eliminated from your body through a bowel movement.  You should expect to receive the results of the Bravo study in approximately 2 weeks from returning the recorder.    For 7 days prior to your test do not take: Dexilant, Prevacid, Nexium, Protonix, Aciphex, Zegerid, Pantoprazole, Prilosec  or Omeprazole.  For 7 days prior to your test, do not take: Reglan, Tagamet, Zantac, Axid or Pepcid.  You MAY use an antacid such as Rolaids or Tums up to 12 hours prior to your test.   Due to recent changes in healthcare laws, you may see the results of your imaging and laboratory studies on MyChart  before your provider has had a chance to review them.  We understand that in some cases there may be results that are confusing or concerning to you. Not all laboratory results come back in the same time frame and the provider may be waiting for multiple results in order to interpret others.  Please give [place] 48 hours in order for your provider to thoroughly review all the results before contacting the office for clarification of your results.    Thank you for entrusting me with your care and choosing [facility].  Dr Lorenso Courier

## 2022-05-19 NOTE — Progress Notes (Signed)
Chief Complaint: GERD  HPI : 41 year old female with history of GERD, anxiety, and athritis presents for follow up of GERD  Interval History: She has been experiencing pain in her LUQ and worsened GERD. She is not taking any naproxen or any other NSAIDs. The GI cocktail helped a little bit, but not as much as it has helped in the past. Endorses chest burning and regurgitation. She will take Zofran to help with any N&V. Endorses use of PPI and H2 blocker. Baclofen has not helped as much, and she was instructed to try to not take it when she takes her Flexeril. Her bowel habits are still pretty regular, but she will sometimes have constipation on occasion. If she gets constipated, the ab pain can get worse. She is using daily Miralax but does not think that it has been helping with her constipation.  Wt Readings from Last 3 Encounters:  05/19/22 207 lb (93.9 kg)  03/30/22 200 lb (90.7 kg)  03/29/22 205 lb 12.8 oz (93.4 kg)   Current Outpatient Medications  Medication Sig Dispense Refill   acetaminophen (TYLENOL) 500 MG tablet Take 1,000 mg by mouth every 6 (six) hours as needed for moderate pain.     AMBULATORY NON FORMULARY MEDICATION 90 ml viscous lidocaine, 90 ml '10mg'$ /5 ml Dicyclomine, 270 ml malox. Take 10 ml twice daily. 450 mL 0   AMBULATORY NON FORMULARY MEDICATION Medication Name: GI Cocktail-270 ml viscous Xylocaine 2% with 270 ml Dicyclomine '10mg'$ /5 ml and 810 ml Mylanta SIG take 8-10 ml PO every 4-6 hours PRN severe pain 1350 mL 0   ASHWAGANDHA PO Take 2 capsules by mouth 2 (two) times daily as needed (anxiety).     cyclobenzaprine (FLEXERIL) 10 MG tablet TAKE 1 TABLET(10 MG) BY MOUTH AT BEDTIME AS NEEDED FOR MUSCLE SPASMS 30 tablet 1   diclofenac Sodium (VOLTAREN) 1 % GEL Apply 2 g topically 4 (four) times daily. 100 g 0   famotidine (PEPCID) 20 MG tablet Take one tablet twice daily as needed 30 tablet 3   FLUoxetine (PROZAC) 40 MG capsule Take 1 capsule (40 mg total) by mouth  daily. (Patient taking differently: Take 40 mg by mouth daily as needed (depression).) 30 capsule 3   fluticasone (FLONASE) 50 MCG/ACT nasal spray Place 2 sprays into both nostrils daily. (Patient taking differently: Place 2 sprays into both nostrils daily as needed for allergies.) 16 g 0   hydrOXYzine (ATARAX) 25 MG tablet Take 1 tablet (25 mg total) by mouth 3 (three) times daily. (Patient taking differently: Take 25 mg by mouth at bedtime.) 60 tablet 3   lidocaine (LIDODERM) 5 % Place 1 patch onto the skin daily. (Patient taking differently: Place 2 patches onto the skin daily as needed (pain).) 30 patch 3   methimazole (TAPAZOLE) 5 MG tablet Take 1 tablet (5 mg total) by mouth daily. 90 tablet 2   naproxen (NAPROSYN) 500 MG tablet Take 1 tablet (500 mg total) by mouth 2 (two) times daily as needed. 30 tablet 1   norethindrone (AYGESTIN) 5 MG tablet Take 1 tablet (5 mg total) by mouth 2 (two) times daily. 60 tablet 3   ondansetron (ZOFRAN-ODT) 4 MG disintegrating tablet Take 1 tablet (4 mg total) by mouth every 8 (eight) hours as needed for nausea or vomiting. 20 tablet 0   pantoprazole (PROTONIX) 40 MG tablet TAKE 1 TABLET(40 MG) BY MOUTH TWICE DAILY BEFORE A MEAL (Patient taking differently: Take 40 mg by mouth 2 (two) times daily.)  180 tablet 0   Phenylephrine-Acetaminophen (TYLENOL SINUS+HEADACHE PO) Take 2 tablets by mouth daily as needed (allergies).     potassium chloride (KLOR-CON) 10 MEQ tablet TAKE 1 TABLET(10 MEQ) BY MOUTH DAILY 90 tablet 0   pregabalin (LYRICA) 75 MG capsule Take 1 capsule (75 mg total) by mouth 3 (three) times daily. (Patient taking differently: Take 75 mg by mouth 3 (three) times daily as needed (pain).) 90 capsule 1   No current facility-administered medications for this visit.   Review of Systems: All systems reviewed and negative except where noted in HPI.   Physical Exam: BP 110/76   Pulse 94   Ht '5\' 6"'$  (1.676 m)   Wt 207 lb (93.9 kg)   BMI 33.41 kg/m   Constitutional: Pleasant,well-developed, female in no acute distress. HEENT: Normocephalic and atraumatic. Conjunctivae are normal. No scleral icterus. Cardiovascular: Normal rate, occasional irregular beats Pulmonary/chest: Effort normal and breath sounds normal. No wheezing, rales or rhonchi. Abdominal: Soft, nondistended, mildly tender in the LLQ. Bowel sounds active throughout. There are no masses palpable. No hepatomegaly. Extremities: No edema Neurological: Alert and oriented to person place and time. Skin: Skin is warm and dry. No rashes noted. Psychiatric: Normal mood and affect. Behavior is normal.  Labs 05/2021: Lipase nml. LFTs nml. CBC and BMP nml. TSH mildly low. FT4 and FT3 nml.  Labs 06/2021: Vit D nml, Vit B12 nml. HCV Ab negative.  CT renal stone study 12/20/16: IMPRESSION: 1. No acute intra-abdominal or pelvic pathology. Specifically there is no hydronephrosis or nephrolithiasis. 2. Moderate colonic stool burden. No bowel obstruction or active inflammation. Normal appendix. 3. Prominent uterus with poorly visualized fibroid.  Barium swallow 08/26/20: IMPRESSION: Moderate gastroesophageal reflux Negative for peptic ulcer disease. 3 mm calcification to the left of L2-3 disc space is new since yesterday. Question symptoms of left renal colic  TTE 08/31/00: IMPRESSIONS   1. Left ventricular ejection fraction, by estimation, is 65 to 70%. The  left ventricle has normal function. The left ventricle has no regional  wall motion abnormalities. Left ventricular diastolic parameters were  normal.   2. Right ventricular systolic function is normal. The right ventricular  size is normal.   3. The mitral valve is normal in structure. Trivial mitral valve  regurgitation.   4. The aortic valve is normal in structure. Aortic valve regurgitation is  not visualized.  CT A/P w/contrast 09/25/21: IMPRESSION: 1. Known uterine fibroid that appears grossly stable in size  better evaluated on pelvic ultrasound 10/08/2016. 2. Hepatomegaly. 3. Constipation. 4. Otherwise no acute intra-abdominal or intrapelvic abnormality.  Speech therapy evaluation 05/27/21: Pt presents with a functional dysphagia that has a significant negative impact on her life - she avoids eating in restaurants; she limits her diet to a narrow selection of foods; she has lost weight.  During today's study, Ms. Guardado self-limited bolus sizes (solid foods ~1/2 tspn; liquids ~ 5 ml) and required encouragement to consume >10 ml sips of liquid or tablespoon sized solid boluses.  She verbalized her concerns about increasing bolus size and became tearful and apologetic and discussed her fears related to swallowing.  Offered encouragement.  Imaging revealed normal biomechanics of the swallow with thorough mastication, timely swallow response, normal pharyngeal squeeze, reliable laryngeal vestibule closure, no penetration/aspiration, no obvious stasis in esophagus upon brief screen.  Ms. Provencio identifed globus sensation during the study. We watched the study together in real time; we discussed the anatomical separation of the airway from the esophagus and discussed that the  feeling of pressure when eating is related to her esophagus, but that her breathing is not compromised.  She was encouraged to record a few frames of the video as affirmation of the normal mechanics of her swallowing.  We discussed recommendations, and Ms. Hargens agreed she might benefit from OP SLP to address the functional nature of her dysphagia. Recommend referral to [facility] neuro OP clinic to address the aforementioned condition.  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 - Non-bleeding gastric ulcers with a clean ulcer base (Forrest Class III). Biopsied. - Normal examined duodenum. - Biopsies were taken with a cold forceps for histology in the proximal esophagus and in the distal esophagus. Path: 1. Surgical [P], gastric  antrum and gastric body - GASTRIC ANTRAL MUCOSA WITH MILD NONSPECIFIC REACTIVE GASTROPATHY - GASTRIC OXYNTIC MUCOSA WITH NO SPECIFIC HISTOPATHOLOGIC CHANGES - WARTHIN STARRY STAIN IS NEGATIVE FOR HELICOBACTER PYLORI 2. Surgical [P], distal esophagus - ESOPHAGEAL SQUAMOUS AND CARDIAC MUCOSA WITH REFLUX-ASSOCIATED CHANGES - NEGATIVE FOR INTESTINAL METAPLASIA OR DYSPLASIA 3. 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 - Normal stomach. - Normal examined duodenum. - No specimens collected.  Esophageal manometry 07/27/21: Normal.  ASSESSMENT AND PLAN: Refractory GERD Dysphagia Abdominal pain Constipation Zenker's diverticulum Patient presents with LUQ ab pain and GERD that has gotten worse recently despite being on PPI BID and H2 blocker BID therapy. Will switch her PPI to see if this helps better with her GERD symptoms and plan for EGD with BRAVO to see if the patient has true acid reflux that is leading to her symptoms. Patient has also had some constipation issues that may be contributing to her ab pain. Will start her on Linzess since Miralax has not been effective for inducing more consistent BMs. - GERD handout - Switch from pantoprazole 40 mg BID to Nexium 40 mg BID - Cont Pepcid 20 mg BID PRN - Start daily Benefiber - Start Linzess 145 mcg QD - Cont Zofran - EGD with BRAVO [facility] in 06/2022  Christia Reading, MD  I spent 42 minutes of time, including in depth chart review, independent review of results as outlined above, communicating results with the patient directly, face-to-face time with the patient, coordinating care, ordering studies and medications as appropriate, and documentation.

## 2022-05-23 ENCOUNTER — Telehealth: Payer: Self-pay | Admitting: Pharmacy Technician

## 2022-05-23 ENCOUNTER — Other Ambulatory Visit (HOSPITAL_COMMUNITY): Payer: Self-pay

## 2022-05-23 NOTE — Telephone Encounter (Signed)
Patient Advocate Encounter  Prior Authorization for ESOMEPRAZOLE '40MG'$  has been approved.    PA# [id number] Effective dates: 12.12.23 through 12.11.24  Ichiro Chesnut B. CPhT P: [phone] F: [phone]

## 2022-05-23 NOTE — Telephone Encounter (Signed)
Patient Advocate Encounter  Received notification from [place] that prior authorization for ESOMEPRAZOLE '40MG'$  is required.   PA submitted on 12.12.23 Key [id number] Status is pending    Luciano Cutter, CPhT Patient Advocate Phone: [phone]

## 2022-05-25 ENCOUNTER — Other Ambulatory Visit (INDEPENDENT_AMBULATORY_CARE_PROVIDER_SITE_OTHER): Payer: Commercial Managed Care - HMO

## 2022-05-25 ENCOUNTER — Encounter: Payer: Self-pay | Admitting: Internal Medicine

## 2022-05-25 DIAGNOSIS — E0590 Thyrotoxicosis, unspecified without thyrotoxic crisis or storm: Secondary | ICD-10-CM | POA: Diagnosis not present

## 2022-05-25 LAB — TSH: TSH: 0.85 u[IU]/mL (ref 0.35–5.50)

## 2022-05-25 LAB — T4, FREE: Free T4: 0.59 ng/dL — ABNORMAL LOW (ref 0.60–1.60)

## 2022-05-26 ENCOUNTER — Encounter: Payer: Self-pay | Admitting: Internal Medicine

## 2022-05-26 ENCOUNTER — Other Ambulatory Visit: Payer: Self-pay | Admitting: Internal Medicine

## 2022-05-26 ENCOUNTER — Other Ambulatory Visit: Payer: Self-pay

## 2022-05-26 MED ORDER — METHIMAZOLE 5 MG PO TABS: 5.0000 mg | ORAL_TABLET | ORAL | 2 refills | Status: DC

## 2022-05-30 ENCOUNTER — Other Ambulatory Visit: Payer: Self-pay

## 2022-06-15 ENCOUNTER — Ambulatory Visit: Payer: Commercial Managed Care - HMO | Admitting: Physical Medicine & Rehabilitation

## 2022-06-22 ENCOUNTER — Telehealth: Payer: Self-pay | Admitting: Internal Medicine

## 2022-06-22 ENCOUNTER — Encounter: Payer: Self-pay | Admitting: Internal Medicine

## 2022-06-22 NOTE — Telephone Encounter (Signed)
Good Morning Dr. Lorenso Courier,   I called this patient at 7:50 am today to see if she was coming for her procedure.  I did not get anyone to answer, so I left a message for her to call back if she was running late or if she wanted to reschedule the appointment.   I will NO SHOW her

## 2022-06-23 ENCOUNTER — Telehealth: Payer: Self-pay | Admitting: Internal Medicine

## 2022-06-23 NOTE — Telephone Encounter (Signed)
Patient called, stated she was sick yesterday and did not come in to her appointment. Is wanting to reschedule.

## 2022-06-23 NOTE — Telephone Encounter (Signed)
Attempted to reach pt.  LMOM that I was trying to reschedule procedure and pt will also need PV to make sure we have instructions straight.

## 2022-06-23 NOTE — Telephone Encounter (Signed)
Patient no showed procedure on 06/22/2022 for BRAVO. Patient is calling back to reschedule stating she was sick and could not come in. Please advise.

## 2022-06-26 NOTE — Telephone Encounter (Signed)
Spoke with pt- PV made for 07-21-22 at 1:00 pm and EGD [place] on 08-02-22 at 4:00 pm

## 2022-07-04 ENCOUNTER — Other Ambulatory Visit: Payer: Self-pay | Admitting: Family Medicine

## 2022-07-04 DIAGNOSIS — F32A Depression, unspecified: Secondary | ICD-10-CM

## 2022-07-04 NOTE — Telephone Encounter (Signed)
Requested Prescriptions  Pending Prescriptions Disp Refills   hydrOXYzine (ATARAX) 25 MG tablet [Pharmacy Med Name: HYDROXYZINE HCL '25MG'$  TABS (WHITE)] 60 tablet 3    Sig: TAKE 1 TABLET(25 MG) BY MOUTH THREE TIMES DAILY     Ear, Nose, and Throat:  Antihistamines 2 Passed - 07/04/2022  3:43 PM      Passed - Cr in normal range and within 360 days    Creatinine, Ser  Date Value Ref Range Status  02/04/2022 0.83 0.44 - 1.00 mg/dL Final         Passed - Valid encounter within last 12 months    Recent Outpatient Visits           3 months ago Hypokalemia   [place], [place]   5 months ago Cervical radiculopathy   [place], Enobong, MD   9 months ago Cervical radiculopathy   [place], Enobong, MD   11 months ago Cervical radiculopathy   [place], Enobong, MD   1 year ago Cervical radiculopathy   [place], MD       Future Appointments             In 1 week [place], Casimer Bilis [place]

## 2022-07-06 ENCOUNTER — Telehealth: Payer: Self-pay | Admitting: Physical Medicine & Rehabilitation

## 2022-07-06 ENCOUNTER — Encounter: Payer: 59 | Admitting: Physical Medicine & Rehabilitation

## 2022-07-06 DIAGNOSIS — G5600 Carpal tunnel syndrome, unspecified upper limb: Secondary | ICD-10-CM

## 2022-07-06 MED ORDER — METHYLPREDNISOLONE 4 MG PO TBPK: ORAL_TABLET | ORAL | 0 refills | Status: DC

## 2022-07-06 NOTE — Telephone Encounter (Signed)
Patient had to cancel appt today--she switched insurance and can't afford copay.  She would like to know what can she do about her hand issue until she is able to come back in.  EMG was rescheduled to March.

## 2022-07-06 NOTE — Telephone Encounter (Addendum)
Patient notified

## 2022-07-09 ENCOUNTER — Other Ambulatory Visit: Payer: Self-pay | Admitting: Physical Medicine & Rehabilitation

## 2022-07-12 ENCOUNTER — Ambulatory Visit: Payer: Self-pay | Admitting: Family Medicine

## 2022-07-17 ENCOUNTER — Ambulatory Visit: Payer: 59 | Attending: Family Medicine | Admitting: Physician Assistant

## 2022-07-17 ENCOUNTER — Telehealth: Payer: Self-pay | Admitting: Pharmacy Technician

## 2022-07-17 ENCOUNTER — Other Ambulatory Visit (HOSPITAL_COMMUNITY): Payer: Self-pay

## 2022-07-17 ENCOUNTER — Encounter: Payer: Self-pay | Admitting: Physician Assistant

## 2022-07-17 VITALS — BP 109/79 | HR 101 | Ht 66.0 in | Wt 203.0 lb

## 2022-07-17 DIAGNOSIS — R519 Headache, unspecified: Secondary | ICD-10-CM | POA: Diagnosis not present

## 2022-07-17 DIAGNOSIS — J0110 Acute frontal sinusitis, unspecified: Secondary | ICD-10-CM | POA: Diagnosis not present

## 2022-07-17 DIAGNOSIS — M21619 Bunion of unspecified foot: Secondary | ICD-10-CM

## 2022-07-17 MED ORDER — KETOROLAC TROMETHAMINE 60 MG/2ML IM SOLN
60.0000 mg | Freq: Once | INTRAMUSCULAR | Status: AC
  Administered 2022-07-17: 60 mg via INTRAMUSCULAR

## 2022-07-17 MED ORDER — NAPROXEN 500 MG PO TABS: 500.0000 mg | ORAL_TABLET | Freq: Two times a day (BID) | ORAL | 1 refills | Status: DC | PRN

## 2022-07-17 MED ORDER — AMOXICILLIN 500 MG PO CAPS: 500.0000 mg | ORAL_CAPSULE | Freq: Three times a day (TID) | ORAL | 0 refills | Status: AC
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
## 2022-07-17 NOTE — Telephone Encounter (Signed)
ERROR

## 2022-07-17 NOTE — Patient Instructions (Signed)
Sinus Infection, Adult use your flonase daily A sinus infection is soreness and swelling (inflammation) of your sinuses. Sinuses are hollow spaces in the bones around your face. They are located: Around your eyes. In the middle of your forehead. Behind your nose. In your cheekbones. Your sinuses and nasal passages are lined with a fluid called mucus. Mucus drains out of your sinuses. Swelling can trap mucus in your sinuses. This lets germs (bacteria, virus, or fungus) grow, which leads to infection. Most of the time, this condition is caused by a virus. What are the causes? Allergies. Asthma. Germs. Things that block your nose or sinuses. Growths in the nose (nasal polyps). Chemicals or irritants in the air. A fungus. This is rare. What increases the risk? Having a weak body defense system (immune system). Doing a lot of swimming or diving. Using nasal sprays too much. Smoking. What are the signs or symptoms? The main symptoms of this condition are pain and a feeling of pressure around the sinuses. Other symptoms include: Stuffy nose (congestion). This may make it hard to breathe through your nose. Runny nose (drainage). Soreness, swelling, and warmth in the sinuses. A cough that may get worse at night. Being unable to smell and taste. Mucus that collects in the throat or the back of the nose (postnasal drip). This may cause a sore throat or bad breath. Being very tired (fatigued). A fever. How is this diagnosed? Your symptoms. Your medical history. A physical exam. Tests to find out if your condition is short-term (acute) or long-term (chronic). Your doctor may: Check your nose for growths (polyps). Check your sinuses using a tool that has a light on one end (endoscope). Check for allergies or germs. Do imaging tests, such as an MRI or CT scan. How is this treated? Treatment for this condition depends on the cause and whether it is short-term or long-term. If caused by a  virus, your symptoms should go away on their own within 10 days. You may be given medicines to relieve symptoms. They include: Medicines that shrink swollen tissue in the nose. A spray that treats swelling of the nostrils. Rinses that help get rid of thick mucus in your nose (nasal saline washes). Medicines that treat allergies (antihistamines). Over-the-counter pain relievers. If caused by bacteria, your doctor may wait to see if you will get better without treatment. You may be given antibiotic medicine if you have: A very bad infection. A weak body defense system. If caused by growths in the nose, surgery may be needed. Follow these instructions at home: Medicines Take, use, or apply over-the-counter and prescription medicines only as told by your doctor. These may include nasal sprays. If you were prescribed an antibiotic medicine, take it as told by your doctor. Do not stop taking it even if you start to feel better. Hydrate and humidify  Drink enough water to keep your pee (urine) pale yellow. Use a cool mist humidifier to keep the humidity level in your home above 50%. Breathe in steam for 10-15 minutes, 3-4 times a day, or as told by your doctor. You can do this in the bathroom while a hot shower is running. Try not to spend time in cool or dry air. Rest Rest as much as you can. Sleep with your head raised (elevated). Make sure you get enough sleep each night. General instructions  Put a warm, moist washcloth on your face 3-4 times a day, or as often as told by your doctor. Use nasal saline  washes as often as told by your doctor. Wash your hands often with soap and water. If you cannot use soap and water, use hand sanitizer. Do not smoke. Avoid being around people who are smoking (secondhand smoke). Keep all follow-up visits. Contact a doctor if: You have a fever. Your symptoms get worse. Your symptoms do not get better within 10 days. Get help right away if: You have a  very bad headache. You cannot stop vomiting. You have very bad pain or swelling around your face or eyes. You have trouble seeing. You feel confused. Your neck is stiff. You have trouble breathing. These symptoms may be an emergency. Get help right away. Call 911. Do not wait to see if the symptoms will go away. Do not drive yourself to the hospital. Summary A sinus infection is swelling of your sinuses. Sinuses are hollow spaces in the bones around your face. This condition is caused by tissues in your nose that become inflamed or swollen. This traps germs. These can lead to infection. If you were prescribed an antibiotic medicine, take it as told by your doctor. Do not stop taking it even if you start to feel better. Keep all follow-up visits. This information is not intended to replace advice given to you by your health care provider. Make sure you discuss any questions you have with your health care provider. Document Revised: 05/03/2021 Document Reviewed: 05/03/2021 Elsevier Patient Education  [place].

## 2022-07-17 NOTE — Telephone Encounter (Signed)
Patient Advocate Encounter  Received notification from [facility] that prior authorization for [facility] is required.   PA submitted on 2.5.24 Key [id number] Status is pending

## 2022-07-21 ENCOUNTER — Ambulatory Visit (AMBULATORY_SURGERY_CENTER): Payer: 59

## 2022-07-21 ENCOUNTER — Encounter: Payer: Self-pay | Admitting: Internal Medicine

## 2022-07-21 VITALS — Ht 67.0 in | Wt 200.0 lb

## 2022-07-21 DIAGNOSIS — K219 Gastro-esophageal reflux disease without esophagitis: Secondary | ICD-10-CM

## 2022-07-21 DIAGNOSIS — R109 Unspecified abdominal pain: Secondary | ICD-10-CM

## 2022-07-21 DIAGNOSIS — K209 Esophagitis, unspecified without bleeding: Secondary | ICD-10-CM

## 2022-07-21 NOTE — Progress Notes (Signed)
Spoke with patient again, about the new [facility] information that was added to her prep instructions.  Including a link to a [facility] video that may answer question she may have.  Sent new instructions to MyChart.

## 2022-07-21 NOTE — Progress Notes (Signed)

## 2022-07-24 ENCOUNTER — Other Ambulatory Visit: Payer: Self-pay | Admitting: Family Medicine

## 2022-07-24 ENCOUNTER — Ambulatory Visit: Payer: Commercial Managed Care - HMO | Admitting: Family Medicine

## 2022-07-24 DIAGNOSIS — Z1231 Encounter for screening mammogram for malignant neoplasm of breast: Secondary | ICD-10-CM

## 2022-07-26 NOTE — Telephone Encounter (Signed)
Received a fax regarding Prior Authorization from [employer] for [employer]. Authorization has been DENIED because   Phone#[phone]

## 2022-07-27 ENCOUNTER — Other Ambulatory Visit (HOSPITAL_COMMUNITY): Payer: Self-pay

## 2022-07-27 NOTE — Telephone Encounter (Signed)
Patient Advocate Encounter  Prior Authorization for [facility] [employer] has been approved.    PA# [id number] Effective dates: 2.15.24 through 2.15.25

## 2022-07-31 ENCOUNTER — Ambulatory Visit: Payer: 59 | Admitting: Podiatry

## 2022-08-01 ENCOUNTER — Encounter: Payer: Self-pay | Admitting: Internal Medicine

## 2022-08-02 ENCOUNTER — Encounter: Payer: Self-pay | Admitting: Internal Medicine

## 2022-08-02 ENCOUNTER — Other Ambulatory Visit: Payer: Self-pay

## 2022-08-02 ENCOUNTER — Telehealth: Payer: Self-pay | Admitting: Internal Medicine

## 2022-08-02 MED ORDER — AMBULATORY NON FORMULARY MEDICATION: 0 refills | Status: DC

## 2022-08-02 NOTE — Telephone Encounter (Signed)
Returned pts call.  She took her nexium on Monday.  Per Dr. Lorenso Courier she will need to reschedule.  Advised her that I will send new instructions for new date by my chart.  She asked if she could get a refill of the GI cocktail.  Dr. Lorenso Courier please advise.

## 2022-08-02 NOTE — Telephone Encounter (Signed)
Prescription printed. Called the patient to confirm what pharmacy she will use. This is compounded. She may or may not be able to get it from [facility]. I would like to know where she got it last time and if she is okay to use the same pharmacy. No answer. Left her a message asking she return my call.

## 2022-08-02 NOTE — Telephone Encounter (Signed)
Inbound call from patient stating that she has an appointment for a [facility] EGD today at 4:00 with Dr. Lorenso Courier. Patient stated she sent a mychart message stating that she took her acid reflux medication and is wanting to know if she can still have procedure or if she needs to reschedule. Please advise.

## 2022-08-08 ENCOUNTER — Other Ambulatory Visit: Payer: Self-pay | Admitting: Family Medicine

## 2022-08-08 NOTE — Telephone Encounter (Signed)
Requested Prescriptions  Pending Prescriptions Disp Refills   potassium chloride (KLOR-CON) 10 MEQ tablet [Pharmacy Med Name: POTASSIUM CL 10MEQ ER TABLETS] 90 tablet 0    Sig: TAKE 1 TABLET(10 MEQ) BY MOUTH DAILY     Endocrinology:  Minerals - Potassium Supplementation Failed - 08/08/2022  6:35 AM      Failed - K in normal range and within 360 days    Potassium  Date Value Ref Range Status  02/04/2022 3.0 (L) 3.5 - 5.1 mmol/L Final         Passed - Cr in normal range and within 360 days    Creatinine, Ser  Date Value Ref Range Status  02/04/2022 0.83 0.44 - 1.00 mg/dL Final         Passed - Valid encounter within last 12 months    Recent Outpatient Visits           3 weeks ago [place], [place]   4 months ago Hypokalemia   [place], [place]   6 months ago Cervical radiculopathy   [place], [place], MD   10 months ago Cervical radiculopathy   [place], [place], MD   1 year ago Cervical radiculopathy   [place], [place], MD       Future Appointments             In 6 days [place], Tamala Fothergill, [place] at [facility], [place]

## 2022-08-11 ENCOUNTER — Encounter: Payer: 59 | Attending: Physical Medicine & Rehabilitation | Admitting: Physical Medicine & Rehabilitation

## 2022-08-14 ENCOUNTER — Encounter: Payer: Self-pay | Admitting: Podiatry

## 2022-08-14 ENCOUNTER — Ambulatory Visit: Payer: 59 | Admitting: Podiatry

## 2022-08-14 ENCOUNTER — Ambulatory Visit (INDEPENDENT_AMBULATORY_CARE_PROVIDER_SITE_OTHER): Payer: 59

## 2022-08-14 DIAGNOSIS — M79672 Pain in left foot: Secondary | ICD-10-CM

## 2022-08-14 DIAGNOSIS — M79671 Pain in right foot: Secondary | ICD-10-CM

## 2022-08-14 DIAGNOSIS — M2041 Other hammer toe(s) (acquired), right foot: Secondary | ICD-10-CM

## 2022-08-14 DIAGNOSIS — D169 Benign neoplasm of bone and articular cartilage, unspecified: Secondary | ICD-10-CM

## 2022-08-14 DIAGNOSIS — M21619 Bunion of unspecified foot: Secondary | ICD-10-CM | POA: Diagnosis not present

## 2022-08-14 NOTE — Progress Notes (Signed)
Subjective:   Patient ID: Laurie Bell, female   DOB: 42 y.o.   MRN: [id number]   HPI Patient presents with chronic bunion pain of both feet and on the right big toe chronic lesion that is painful and on the fifth toe lesion that is painful and hard to wear shoe gear with.  States she has tried wider shoes she has tried soaks she has tried cushions without relief and patient does not smoke likes to be active   Review of Systems  All other systems reviewed and are negative.       Objective:  Physical Exam Vitals and nursing note reviewed.  Constitutional:      Appearance: She is well-developed.  Pulmonary:     Effort: Pulmonary effort is normal.  Musculoskeletal:        General: Normal range of motion.  Skin:    General: Skin is warm.  Neurological:     Mental Status: She is alert.     Neurovascular status intact muscle strength found to be adequate range of motion adequate with moderate structural bunion deformity bilateral with redness on the first metatarsal heads which do get tender no range of motion loss with keratotic lesion on the medial side of the right big toe and on the fifth digit on the lateral side of the fifth toe distal.  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  I do think that bunion surgery could be done along with digital surgery and I did explain procedures and I do think distal osteotomy will solve this problem for her.  Patient wants surgery and at this point I reviewed with her what will be required and after review she understands this wants to do this but needs to hold off till next month.  Will do the right foot first bunion exostectomy medial  side and distal arthroplasty digit 5 right  X-rays indicate structural malalignment elevation of the intermetatarsal angle and digital deformity with rotation right over left foot.  Approximate 15 degree angle bilateral 1 2

## 2022-08-21 ENCOUNTER — Inpatient Hospital Stay: Admission: RE | Admit: 2022-08-21 | Payer: 59 | Source: Ambulatory Visit

## 2022-08-22 ENCOUNTER — Encounter (HOSPITAL_COMMUNITY): Payer: Self-pay

## 2022-08-22 ENCOUNTER — Ambulatory Visit (HOSPITAL_COMMUNITY)
Admission: EM | Admit: 2022-08-22 | Discharge: 2022-08-22 | Disposition: A | Discharge status: Home / Self Care | Payer: 59 | Attending: Internal Medicine | Admitting: Internal Medicine

## 2022-08-22 DIAGNOSIS — N898 Other specified noninflammatory disorders of vagina: Secondary | ICD-10-CM

## 2022-08-22 DIAGNOSIS — Z3202 Encounter for pregnancy test, result negative: Secondary | ICD-10-CM | POA: Insufficient documentation

## 2022-08-22 DIAGNOSIS — M898X1 Other specified disorders of bone, shoulder: Secondary | ICD-10-CM | POA: Insufficient documentation

## 2022-08-22 LAB — POC URINE PREG, ED: Preg Test, Ur: NEGATIVE

## 2022-08-22 MED ORDER — PREDNISONE 20 MG PO TABS: 40.0000 mg | ORAL_TABLET | Freq: Every day | ORAL | 0 refills | Status: AC

## 2022-08-22 NOTE — Discharge Instructions (Addendum)
Your STD testing has been sent to the lab and will come back in the next 2 to 3 days.  We will call you if any of your results are positive requiring treatment and treat you at that time.   If you do not receive a phone call from [place], this means your testing was negative.  Avoid sexual intercourse until your STD results come back.  If any of your STD results are positive, you will need to avoid sexual intercourse for 7 days while you are being treated to prevent spread of STD.  Condom use is the best way to prevent spread of STDs.  Take prednisone 40 mg once daily for the next 5 days to calm down inflammation to your left neck.  Do not take any naproxen when taking this medicine as it can cause stomach upset.  Take this medicine with food to avoid stomach upset.  Return to urgent care as needed.

## 2022-08-22 NOTE — ED Triage Notes (Signed)
Pt states left clavicle pain for several months. Pt reports taking Flexeril with no relief. Pt states vaginal discharge x 2 days.

## 2022-08-22 NOTE — ED Provider Notes (Signed)
[facility]    CSN: [id number] Arrival date & time: 08/22/22  1822      History   Chief Complaint Chief Complaint  Patient presents with   Clavicle Injury   Dysuria   Vaginal Discharge    HPI Laurie Bell is a 42 y.o. female.   Patient presents to urgent care for evaluation of supraclavicular discomfort to the neck that has been ongoing for many months intermittently. She has never broken her left clavicle. No recent trauma, injuries, or heavy lifting contributing to patient's discomfort to the left neck/left supraclavicular area. States pain is worse with head movement at the neck. Patient works at [employer] and suddenly experienced point tenderness to the area while she was at work that subsided on its own without intervention, but then returned moments later. She is concerned there may be a swollen lymph node to the area causing her tenderness. Pain sometimes radiates to the left side of the head and to the left shoulder. She denies chest pain, shortness of breath, nausea/vomiting, dizziness, and lightheadedness. No vision changes, fever/chills, or redness to the area. No rash. She has received physical therapy for this in the past. She has been taking naproxen, flexeril, and tylenol to help with pain without relief. Has not followed up with orthopedics regarding this in "a while".   Patient would also like to be evaluated for thin white vaginal discharge that has been present for the last 2 days. No recent new sexual partners or concern for STD. She denies vaginal itching and urinary symptoms. Reports slight vaginal odor. No abdominal pain or flank pain. She has not had her menstrual cycle in 2 months, does not use contraception.    Dysuria Associated symptoms: vaginal discharge   Vaginal Discharge Associated symptoms: dysuria     Past Medical History:  Diagnosis Date   Anxiety    Arthritis    Chlamydia contact, treated    GERD (gastroesophageal reflux  disease)    Hyperthyroidism     Patient Active Problem List   Diagnosis Date Noted   Multinodular goiter 03/22/2022   Subclinical hyperthyroidism 03/22/2022   MDD (major depressive disorder), recurrent episode, moderate (HCC) 11/03/2021   GAD (generalized anxiety disorder) 11/03/2021   Neck pain 10/28/2021   Cervical high risk HPV (human papillomavirus) test positive 10/17/2021   Irregular heart rhythm 04/15/2021   Numbness on left side 04/15/2021   Dysphagia 03/17/2021   Hypomagnesemia 02/23/2021   Gastroesophageal reflux disease    Hypokalemia 08/24/2020   Atypical chest pain 08/24/2020   Nausea and vomiting 08/24/2020   Hypocalcemia 08/24/2020   DUB (dysfunctional uterine bleeding) 01/12/2017   Fibroid, uterine 01/12/2017    Past Surgical History:  Procedure Laterality Date   ENDOMETRIAL ABLATION N/A 04/25/2017   Procedure: ENDOMETRIAL ABLATION With NOVASURE;  Surgeon: Adam Phenix, MD;  Location:  SUR[facility]ERY CENTER;  Service: Gynecology;  Laterality: N/A;   ESOPHAGEAL MANOMETRY N/A 07/27/2021   Procedure: ESOPHAGEAL MANOMETRY (EM);  Surgeon: Imogene Burn, MD;  Location: [facility];  Service: Gastroenterology;  Laterality: N/A;   TUBAL LIGATION  2003    OB History     Gravida  2   Para  2   Term  2   Preterm  0   AB  0   Living         SAB  0   IAB  0   Ectopic  0   Multiple      Live Births  Home Medications    Prior to Admission medications   Medication Sig Start Date End Date Taking? Authorizing Provider  acetaminophen (TYLENOL) 500 MG tablet Take 1,000 mg by mouth every 6 (six) hours as needed for moderate pain.   Yes [provider]  AMBULATORY NON FORMULARY MEDICATION Medication Name: GI Cocktail-270 ml viscous Xylocaine 2% with 270 ml Dicyclomine 10mg /5 ml and 810 ml Mylanta SIG take 8-10 ml PO every 4-6 hours PRN severe stomach pain 08/02/22  Yes Imogene Burn, MD  ASHWAGANDHA PO Take 2 capsules by  mouth 2 (two) times daily as needed (anxiety).   Yes [provider]  cyclobenzaprine (FLEXERIL) 10 MG tablet TAKE 1 TABLET(10 MG) BY MOUTH AT BEDTIME AS NEEDED FOR MUSCLE SPASMS 07/10/22  Yes Kirsteins, Victorino Sparrow, MD  esomeprazole (NEXIUM) 40 MG capsule Take 1 capsule (40 mg total) by mouth in the morning and at bedtime. 05/19/22  Yes Imogene Burn, MD  potassium chloride (KLOR-CON) 10 MEQ tablet TAKE 1 TABLET(10 MEQ) BY MOUTH DAILY 08/08/22  Yes Hoy Register, MD  predniSONE (DELTASONE) 20 MG tablet Take 2 tablets (40 mg total) by mouth daily for 5 days. 08/22/22 08/27/22 Yes Carlisle Beers, FNP  famotidine (PEPCID) 20 MG tablet Take one tablet twice daily as needed 03/08/22   Imogene Burn, MD  FLUoxetine (PROZAC) 40 MG capsule Take 1 capsule (40 mg total) by mouth daily. 08/18/21   Hoy Register, MD  fluticasone (FLONASE) 50 MCG/ACT nasal spray Place 2 sprays into both nostrils daily. Patient taking differently: Place 2 sprays into both nostrils daily as needed for allergies. 12/30/21   Lurline Idol, FNP  hydrOXYzine (ATARAX) 25 MG tablet TAKE 1 TABLET(25 MG) BY MOUTH THREE TIMES DAILY 07/04/22   Hoy Register, MD  lidocaine (LIDODERM) 5 % Place 1 patch onto the skin daily. 08/26/21   Hoy Register, MD  linaclotide Karlene Einstein) 145 MCG CAPS capsule Take 1 capsule (145 mcg total) by mouth daily before breakfast. 05/19/22   Imogene Burn, MD  methimazole (TAPAZOLE) 5 MG tablet Take 1 tablet (5 mg total) by mouth as directed. 1 tablet Monday through Saturdays, skips Sundays 05/26/22   Shamleffer, Konrad Dolores, MD  naproxen (NAPROSYN) 500 MG tablet Take 1 tablet (500 mg total) by mouth 2 (two) times daily as needed. Prn pain 07/17/22   Anders Simmonds, PA-C  norethindrone (AYGESTIN) 5 MG tablet Take 1 tablet (5 mg total) by mouth 2 (two) times daily. 11/23/21   Jerene Bears, MD  ondansetron (ZOFRAN-ODT) 4 MG disintegrating tablet Take 1 tablet (4 mg total) by mouth every 8 (eight)  hours as needed for nausea or vomiting. 08/18/21   Imogene Burn, MD  Phenylephrine-Acetaminophen (TYLENOL SINUS+HEADACHE PO) Take 2 tablets by mouth daily as needed (allergies).    [provider]  pregabalin (LYRICA) 75 MG capsule Take 1 capsule (75 mg total) by mouth 3 (three) times daily. 10/28/21   Kirsteins, Victorino Sparrow, MD  medroxyPROGESTERone (PROVERA) 10 MG tablet Take 2 tablets by mouth daily Patient not taking: Reported on 06/05/2019 02/06/18 02/02/20  Conan Bowens, MD    Family History Family History  Problem Relation Age of Onset   Hypertension Mother    Hypertension Maternal Aunt    Cancer Maternal Grandmother        type unknown   Esophageal cancer Neg Hx    Rectal cancer Neg Hx    Stomach cancer Neg Hx    Colon cancer Neg Hx  Social History Social History   Tobacco Use   Smoking status: Never   Smokeless tobacco: Never  Vaping Use   Vaping Use: Never used  Substance Use Topics   Alcohol use: No   Drug use: Not Currently    Types: Marijuana    Comment: quit 2018     Allergies   Latex   Review of Systems Review of Systems  Genitourinary:  Positive for dysuria and vaginal discharge.  Per HPI   Physical Exam Triage Vital Signs ED Triage Vitals  Enc Vitals Group     BP 08/22/22 1912 (!) 144/85     Pulse Rate 08/22/22 1913 95     Resp 08/22/22 1912 18     Temp 08/22/22 1913 97.6 F (36.4 C)     Temp Source 08/22/22 1913 Oral     SpO2 08/22/22 1912 96 %     Weight --      Height --      Head Circumference --      Peak Flow --      Pain Score --      Pain Loc --      Pain Edu? --      Excl. in GC? --    No data found.  Updated Vital Signs BP (!) 144/85 (BP Location: Left Arm)   Pulse 95   Temp 97.6 F (36.4 C) (Oral)   Resp 18   LMP 06/23/2022   SpO2 96%   Visual Acuity Right Eye Distance:   Left Eye Distance:   Bilateral Distance:    Right Eye Near:   Left Eye Near:    Bilateral Near:     Physical Exam Vitals and  nursing note reviewed.  Constitutional:      Appearance: She is not ill-appearing or toxic-appearing.  HENT:     Head: Normocephalic and atraumatic.     Right Ear: Hearing, tympanic membrane, ear canal and external ear normal.     Left Ear: Hearing, tympanic membrane, ear canal and external ear normal.     Nose: Nose normal.     Mouth/Throat:     Lips: Pink.     Mouth: Mucous membranes are moist. No injury.     Tongue: No lesions. Tongue does not deviate from midline.     Palate: No mass and lesions.     Pharynx: Oropharynx is clear. Uvula midline. No pharyngeal swelling, oropharyngeal exudate, posterior oropharyngeal erythema or uvula swelling.     Tonsils: No tonsillar exudate or tonsillar abscesses.  Eyes:     General: Lids are normal. Vision grossly intact. Gaze aligned appropriately.     Extraocular Movements: Extraocular movements intact.     Conjunctiva/sclera: Conjunctivae normal.  Neck:     Thyroid: No thyromegaly or thyroid tenderness.     Trachea: Trachea and phonation normal.      Comments: Generalized tenderness to palpation of the left superior clavicular area and the left trapezius muscle. No midline tenderness to the C spine. Non tender to palpation of the left shoulder or left pre/postauricular areas. No supraclavicular lymphadenopathy appreciated. No redness, warmth, ecchymosis, deformity, or swelling appreciated.  Cardiovascular:     Rate and Rhythm: Normal rate and regular rhythm.     Heart sounds: Normal heart sounds, S1 normal and S2 normal.  Pulmonary:     Effort: Pulmonary effort is normal. No respiratory distress.     Breath sounds: Normal breath sounds and air entry.  Genitourinary:    Comments: Deferred.  Musculoskeletal:     Cervical back: Normal range of motion and neck supple. Tenderness present. No edema, erythema, signs of trauma, rigidity or crepitus. Muscular tenderness present. No pain with movement or spinous process tenderness. Normal range of  motion.  Lymphadenopathy:     Cervical: No cervical adenopathy.     Right cervical: No superficial, deep or posterior cervical adenopathy.    Left cervical: No superficial, deep or posterior cervical adenopathy.  Skin:    General: Skin is warm and dry.     Capillary Refill: Capillary refill takes less than 2 seconds.     Findings: No rash.  Neurological:     General: No focal deficit present.     Mental Status: She is alert and oriented to person, place, and time. Mental status is at baseline.     Cranial Nerves: No dysarthria or facial asymmetry.  Psychiatric:        Mood and Affect: Mood normal.        Speech: Speech normal.        Behavior: Behavior normal.        Thought Content: Thought content normal.        Judgment: Judgment normal.      UC Treatments / Results  Labs (all labs ordered are listed, but only abnormal results are displayed) Labs Reviewed  POC URINE PREG, ED  CERVICOVAGINAL ANCILLARY ONLY    EKG   Radiology No results found.  Procedures Procedures (including critical care time)  Medications Ordered in UC Medications - No data to display  Initial Impression / Assessment and Plan / UC Course  I have reviewed the triage vital signs and the nursing notes.  Pertinent labs & imaging results that were available during my care of the patient were reviewed by me and considered in my medical decision making (see chart for details).   1. Pain of the left clavicle Unclear etiology of patient's discomfort to the left clavicle, however likely due to inflammation and musculoskeletal in nature. Pain has not responded well to as needed use of NSAIDs, therefore steroid burst sent to pharmacy. Advised follow-up with walking referral to orthopedics for ongoing evaluation and management of chronic pain of left clavicle. Low suspicion for cardiac involvement. Vital signs are hemodynamically stable, patient is nontoxic in appearance. PCP and orthopedics follow-up  recommended as well as heat and gentle ROM exercises.  2. Vaginal discharge STI labs pending.  Patient declines HIV and syphilis testing today.  Will notify patient of positive results and treat accordingly when labs come back.  Patient to avoid sexual intercourse until screening testing comes back.  Education provided regarding safe sexual practices and patient encouraged to use protection to prevent spread of STIs.  Urine pregnancy test is negative.  Discussed physical exam and available lab work findings in clinic with patient.  Counseled patient regarding appropriate use of medications and potential side effects for all medications recommended or prescribed today. Discussed red flag signs and symptoms of worsening condition,when to call the PCP office, return to urgent care, and when to seek higher level of care in the emergency department. Patient verbalizes understanding and agreement with plan. All questions answered. Patient discharged in stable condition.   Final Clinical Impressions(s) / UC Diagnoses   Final diagnoses:  Pain of left clavicle  Vaginal discharge     Discharge Instructions      Your STD testing has been sent to the lab and will come back in the next 2 to 3  days.  We will call you if any of your results are positive requiring treatment and treat you at that time.   If you do not receive a phone call from [place], this means your testing was negative.  Avoid sexual intercourse until your STD results come back.  If any of your STD results are positive, you will need to avoid sexual intercourse for 7 days while you are being treated to prevent spread of STD.  Condom use is the best way to prevent spread of STDs.  Take prednisone 40 mg once daily for the next 5 days to calm down inflammation to your left neck.  Do not take any naproxen when taking this medicine as it can cause stomach upset.  Take this medicine with food to avoid stomach upset.  Return to urgent care as  needed.      ED Prescriptions     Medication Sig Dispense Auth. Provider   predniSONE (DELTASONE) 20 MG tablet Take 2 tablets (40 mg total) by mouth daily for 5 days. 10 tablet Carlisle Beers, FNP      PDMP not reviewed this encounter.   Carlisle Beers, [place] 08/23/22 1530

## 2022-08-23 ENCOUNTER — Telehealth (HOSPITAL_COMMUNITY): Payer: Self-pay | Admitting: Emergency Medicine

## 2022-08-23 LAB — CERVICOVAGINAL ANCILLARY ONLY
Bacterial Vaginitis (gardnerella): POSITIVE — AB
Candida Glabrata: NEGATIVE
Candida Vaginitis: NEGATIVE
Chlamydia: NEGATIVE
Comment: NEGATIVE
Comment: NEGATIVE
Comment: NEGATIVE
Comment: NEGATIVE
Comment: NEGATIVE
Comment: NORMAL
Neisseria Gonorrhea: NEGATIVE
Trichomonas: NEGATIVE

## 2022-08-23 MED ORDER — METRONIDAZOLE 500 MG PO TABS: 500.0000 mg | ORAL_TABLET | Freq: Two times a day (BID) | ORAL | 0 refills | Status: DC

## 2022-08-24 ENCOUNTER — Ambulatory Visit
Admission: RE | Admit: 2022-08-24 | Discharge: 2022-08-24 | Disposition: A | Discharge status: Home / Self Care | Payer: 59 | Source: Ambulatory Visit | Attending: Family Medicine | Admitting: Family Medicine

## 2022-08-24 DIAGNOSIS — Z1231 Encounter for screening mammogram for malignant neoplasm of breast: Secondary | ICD-10-CM

## 2022-08-28 ENCOUNTER — Ambulatory Visit: Payer: 59 | Admitting: Podiatry

## 2022-08-31 ENCOUNTER — Telehealth: Payer: Self-pay | Admitting: Emergency Medicine

## 2022-08-31 ENCOUNTER — Telehealth: Payer: Self-pay | Admitting: *Deleted

## 2022-08-31 ENCOUNTER — Encounter: Payer: Self-pay | Admitting: Family Medicine

## 2022-08-31 ENCOUNTER — Other Ambulatory Visit: Payer: Self-pay | Admitting: Family Medicine

## 2022-08-31 DIAGNOSIS — R928 Other abnormal and inconclusive findings on diagnostic imaging of breast: Secondary | ICD-10-CM

## 2022-08-31 NOTE — Telephone Encounter (Signed)
Pt requesting additional information regarding mammogram results. Questioning what abnormality was found. "Very anxious about this,would like more information."  States may respond in MyChart. Please advise.   "Hello, The breast center will contact you for additional imaging to evaluate some abnormality in your right breast. -Dr. Margarita Rana"

## 2022-08-31 NOTE — Telephone Encounter (Signed)
Patient has viewed her results via mychart.

## 2022-08-31 NOTE — Telephone Encounter (Signed)
Copied from [place] [id number]. Topic: General - Inquiry >> Aug 31, 2022 12:53 PM Ludger Nutting wrote: Patient called to get the results of her mammogram. Patient states that she was contacted to schedule a diagnotic mammogram and she is now worried that something is wrong. Please follow up with patient asap.

## 2022-09-01 ENCOUNTER — Telehealth: Payer: Self-pay | Admitting: *Deleted

## 2022-09-01 NOTE — Telephone Encounter (Signed)
Pt given mammogram results per notes of Dr. Margarita Rana from 08/31/22 on 09/01/22. Pt verbalized understanding the breast center will contact her for additional imaging to evaluate some abnormality in right breast. Patient reports she has f/u appt 09/19/22. Reviewed message from A. Magdalen Spatz, CMA from today that breast center states further evaluation is suggested for calcification in the right breast, this is the reason they are wanting to do additional imaging. Patient verbalized understanding.

## 2022-09-01 NOTE — Telephone Encounter (Signed)
Attempted to contact patient and VM was left informing her to return call.  Patient has been sent a mychart message informing her the reason additional imaging is needed.

## 2022-09-01 NOTE — Telephone Encounter (Signed)
Noted  

## 2022-09-07 ENCOUNTER — Other Ambulatory Visit: Payer: Self-pay | Admitting: Internal Medicine

## 2022-09-07 DIAGNOSIS — K219 Gastro-esophageal reflux disease without esophagitis: Secondary | ICD-10-CM

## 2022-09-11 NOTE — Telephone Encounter (Signed)
error 

## 2022-09-14 ENCOUNTER — Other Ambulatory Visit: Payer: Self-pay

## 2022-09-14 MED ORDER — AMBULATORY NON FORMULARY MEDICATION: 0 refills | Status: DC

## 2022-09-14 NOTE — Telephone Encounter (Signed)
Spoke with patient. Confirmed prescription faxed to [facility]. No further questions at this time.

## 2022-09-14 NOTE — Telephone Encounter (Signed)
PT is calling to get the GI cocktail sent in for her. She wants in sent to [facility] because [employer] it. Requesting call back when sent

## 2022-09-18 ENCOUNTER — Telehealth: Payer: Self-pay

## 2022-09-18 ENCOUNTER — Telehealth: Payer: Self-pay | Admitting: Internal Medicine

## 2022-09-18 NOTE — Telephone Encounter (Signed)
Inbound call from patient, stated her insurance advised her they had faxed paperwork a week ago to seek more information about prescription of Nexium. Stated they had not received the paperwork back and patient is now out of medication and stated her acid reflux is worsening. She states it is in regards to taking two pills a day instead of one. Please advise.

## 2022-09-18 NOTE — Telephone Encounter (Signed)
Patient calls stating Nexium BID needs prior authorization. She is out of medication.

## 2022-09-18 NOTE — Telephone Encounter (Addendum)
Spoke with the patient. Advised her the team has been notified and assigned high priority because she is out of Nexium. Patient reports her symptoms have been controlled on the Nexium 40 mg BID.

## 2022-09-19 ENCOUNTER — Ambulatory Visit
Admission: RE | Admit: 2022-09-19 | Discharge: 2022-09-19 | Disposition: A | Discharge status: Home / Self Care | Payer: 59 | Source: Ambulatory Visit | Attending: Family Medicine | Admitting: Family Medicine

## 2022-09-19 DIAGNOSIS — R921 Mammographic calcification found on diagnostic imaging of breast: Secondary | ICD-10-CM | POA: Diagnosis not present

## 2022-09-19 DIAGNOSIS — R928 Other abnormal and inconclusive findings on diagnostic imaging of breast: Secondary | ICD-10-CM

## 2022-09-20 ENCOUNTER — Other Ambulatory Visit: Payer: Self-pay | Admitting: Family Medicine

## 2022-09-20 ENCOUNTER — Other Ambulatory Visit (HOSPITAL_COMMUNITY): Payer: Self-pay

## 2022-09-20 ENCOUNTER — Other Ambulatory Visit: Payer: Self-pay | Admitting: Physician Assistant

## 2022-09-20 ENCOUNTER — Telehealth: Payer: Self-pay

## 2022-09-20 DIAGNOSIS — R519 Headache, unspecified: Secondary | ICD-10-CM

## 2022-09-20 DIAGNOSIS — R921 Mammographic calcification found on diagnostic imaging of breast: Secondary | ICD-10-CM

## 2022-09-20 NOTE — Telephone Encounter (Signed)
Requested by interface surescripts. Protocols met Requested Prescriptions  Pending Prescriptions Disp Refills   naproxen (NAPROSYN) 500 MG tablet [Pharmacy Med Name: NAPROXEN 500 MG TABLET] 60 tablet 1    Sig: TAKE 1 TABLET (500 MG TOTAL) BY MOUTH 2 (TWO) TIMES DAILY AS NEEDED FOR PAIN     Analgesics:  NSAIDS Failed - 09/20/2022  2:22 AM      Failed - Manual Review: Labs are only required if the patient has taken medication for more than 8 weeks.      Passed - Cr in normal range and within 360 days    Creatinine, Ser  Date Value Ref Range Status  02/04/2022 0.83 0.44 - 1.00 mg/dL Final         Passed - HGB in normal range and within 360 days    Hemoglobin  Date Value Ref Range Status  02/04/2022 12.8 12.0 - 15.0 g/dL Final         Passed - PLT in normal range and within 360 days    Platelets  Date Value Ref Range Status  02/04/2022 244 150 - 400 K/uL Final         Passed - HCT in normal range and within 360 days    HCT  Date Value Ref Range Status  02/04/2022 37.8 36.0 - 46.0 % Final         Passed - eGFR is 30 or above and within 360 days    GFR calc Af Amer  Date Value Ref Range Status  02/01/2020 >60 >60 mL/min Final   GFR, Estimated  Date Value Ref Range Status  02/04/2022 >60 >60 mL/min Final    Comment:    (NOTE) Calculated using the CKD-EPI Creatinine Equation (2021)    eGFR  Date Value Ref Range Status  01/18/2022 103 >59 mL/min/1.73 Final         Passed - Patient is not pregnant      Passed - Valid encounter within last 12 months    Recent Outpatient Visits           2 months ago [facility]   [facility] [place], [place], [place]   6 months ago Hypokalemia   [facility] Marcine Matar, MD   8 months ago Cervical radiculopathy   [place] [facility] Hoy Register, MD   11 months ago Cervical radiculopathy   [facility] Hoy Register, MD   1 year ago Cervical radiculopathy   [facility] Hoy Register, MD

## 2022-09-20 NOTE — Telephone Encounter (Signed)
Message to Pharmacy team supervisor.

## 2022-09-20 NOTE — Telephone Encounter (Signed)
Patient Advocate Encounter   Received notification from [employer] that prior authorization is required for NEXIUM 40 MG delayed-release capsules   Submitted: 09-20-2022 Key [id number]  This was submitted as urgent Status is pending

## 2022-09-20 NOTE — Telephone Encounter (Signed)
Patient called to follow up on previous message regarding Nexium.

## 2022-09-20 NOTE — Telephone Encounter (Signed)
[facility] pharmacy calling requesting prior auth for Nexium on patients behalf.

## 2022-09-20 NOTE — Telephone Encounter (Signed)
Patient is calling again.

## 2022-09-21 ENCOUNTER — Other Ambulatory Visit (HOSPITAL_COMMUNITY): Payer: Self-pay

## 2022-09-21 NOTE — Telephone Encounter (Signed)
Urgent PA submitted in 09/20/22 encounter

## 2022-09-22 ENCOUNTER — Other Ambulatory Visit (HOSPITAL_COMMUNITY): Payer: Self-pay

## 2022-09-22 NOTE — Telephone Encounter (Signed)
Inbound call from patient, she is very upset that she has been without medication for a week. States the insurance advised her that they have not received any prior authorization for this medication.

## 2022-09-22 NOTE — Telephone Encounter (Signed)
I spoke with the patient and apologized for the delay. Assured her the Pharmacy Team has done their part in submitting the forms required by the insurance company. Patient thanks me for the call.

## 2022-09-25 NOTE — Telephone Encounter (Signed)
Patient Advocate Encounter  Prior Authorization for NEXIUM 40 MG delayed-release capsules has been approved through [employer].    Key [id number]   Effective: 09-22-2022 to 09-22-2023

## 2022-09-25 NOTE — Progress Notes (Unsigned)
Name: Laurie Bell  MRN/ DOB: [id number], 05-27-1981    Age/ Sex: 42 y.o., female    PCP: Hoy Register, MD   Reason for Endocrinology Evaluation: Low TSH     Date of Initial Endocrinology Evaluation: 12/09/2021    HPI: [name] is a 42 y.o. female with a past medical history of. The patient presented for initial endocrinology clinic visit on 12/09/2021 for consultative assistance with her low TSH.   Patient has been noted with low TSH since September 2012 with a TSH at 0.129u IU/mL, with normal free T4.  TRAb was undetectable Thyroid ultrasound on 12/23/2021 revealed multinodular goiter with a right superior 1.5 cm nodule meeting FNA criteria  We opted to stop methimazole due to multiple symptoms 11/2021 Mother and maternal aunt with thyroid disease   SUBJECTIVE:    Today (09/25/22):  [name] is here for a follow up on MNG and subclinical hyperthyroidism  The patient has not had her FNA of the right superior nodule yet because she declined due to extreme fear of needles  She was recently married 2 weeks ago   She denies local neck swelling   Has occasional constipation  Has chronic palpitations  Has palpitations  No Biotin intake     Methimazole 5 mg , 1 tab 6 days a week   HISTORY:  Past Medical History:  Past Medical History:  Diagnosis Date   Anxiety    Arthritis    Chlamydia contact, treated    GERD (gastroesophageal reflux disease)    Hyperthyroidism    Past Surgical History:  Past Surgical History:  Procedure Laterality Date   ENDOMETRIAL ABLATION N/A 04/25/2017   Procedure: ENDOMETRIAL ABLATION With NOVASURE;  Surgeon: Adam Phenix, MD;  Location: [facility];  Service: Gynecology;  Laterality: N/A;   ESOPHAGEAL MANOMETRY N/A 07/27/2021   Procedure: ESOPHAGEAL MANOMETRY (EM);  Surgeon: Imogene Burn, MD;  Location: [facility];  Service: Gastroenterology;  Laterality: N/A;   TUBAL LIGATION  2003    Social  History:  reports that she has never smoked. She has never used smokeless tobacco. She reports that she does not currently use drugs after having used the following drugs: Marijuana. She reports that she does not drink alcohol. Family History: family history includes Breast cancer in her maternal grandmother; Cancer in her maternal grandmother; Hypertension in her maternal aunt and mother.   HOME MEDICATIONS: Allergies as of 09/26/2022       Reactions   Latex Other (See Comments)   Burn in vaginal area with latex condoms        Medication List        Accurate as of September 25, 2022 11:25 AM. If you have any questions, ask your nurse or doctor.          acetaminophen 500 MG tablet Commonly known as: TYLENOL Take 1,000 mg by mouth every 6 (six) hours as needed for moderate pain.   AMBULATORY NON FORMULARY MEDICATION Medication Name: GI Cocktail-270 ml viscous Xylocaine 2% with 270 ml Dicyclomine 10mg /5 ml and 810 ml Mylanta SIG take 8-10 ml PO every 4-6 hours PRN severe stomach pain   ASHWAGANDHA PO Take 2 capsules by mouth 2 (two) times daily as needed (anxiety).   cyclobenzaprine 10 MG tablet Commonly known as: FLEXERIL TAKE 1 TABLET(10 MG) BY MOUTH AT BEDTIME AS NEEDED FOR MUSCLE SPASMS   esomeprazole 40 MG capsule Commonly known as: NEXIUM Take 1 capsule (40 mg total)  by mouth in the morning and at bedtime.   famotidine 20 MG tablet Commonly known as: PEPCID Take one tablet twice daily as needed   FLUoxetine 40 MG capsule Commonly known as: PROzac Take 1 capsule (40 mg total) by mouth daily.   fluticasone 50 MCG/ACT nasal spray Commonly known as: FLONASE Place 2 sprays into both nostrils daily. What changed:  when to take this reasons to take this   hydrOXYzine 25 MG tablet Commonly known as: ATARAX TAKE 1 TABLET(25 MG) BY MOUTH THREE TIMES DAILY   lidocaine 5 % Commonly known as: Lidoderm Place 1 patch onto the skin daily.   linaclotide 145 MCG Caps  capsule Commonly known as: Linzess Take 1 capsule (145 mcg total) by mouth daily before breakfast.   methimazole 5 MG tablet Commonly known as: TAPAZOLE Take 1 tablet (5 mg total) by mouth as directed. 1 tablet Monday through Saturdays, skips Sundays   metroNIDAZOLE 500 MG tablet Commonly known as: FLAGYL Take 1 tablet (500 mg total) by mouth 2 (two) times daily.   naproxen 500 MG tablet Commonly known as: NAPROSYN TAKE 1 TABLET (500 MG TOTAL) BY MOUTH 2 (TWO) TIMES DAILY AS NEEDED FOR PAIN   norethindrone 5 MG tablet Commonly known as: AYGESTIN Take 1 tablet (5 mg total) by mouth 2 (two) times daily.   ondansetron 4 MG disintegrating tablet Commonly known as: ZOFRAN-ODT Take 1 tablet (4 mg total) by mouth every 8 (eight) hours as needed for nausea or vomiting.   potassium chloride 10 MEQ tablet Commonly known as: KLOR-CON TAKE 1 TABLET(10 MEQ) BY MOUTH DAILY   pregabalin 75 MG capsule Commonly known as: LYRICA Take 1 capsule (75 mg total) by mouth 3 (three) times daily.   TYLENOL SINUS+HEADACHE PO Take 2 tablets by mouth daily as needed (allergies).          REVIEW OF SYSTEMS: A comprehensive ROS was conducted with the patient and is negative except as per HPI    OBJECTIVE:  VS: There were no vitals taken for this visit.   Wt Readings from Last 3 Encounters:  07/21/22 200 lb (90.7 kg)  07/17/22 203 lb (92.1 kg)  05/19/22 207 lb (93.9 kg)     EXAM: General: Pt appears well and is in NAD  Neck: General: Supple without adenopathy. Thyroid: Thyroid is prominent.  No goiter or nodules appreciated.   Lungs: Clear with good BS bilat with no rales, rhonchi, or wheezes  Heart: Auscultation: RRR.  Abdomen: Normoactive bowel sounds, soft, nontender, without masses or organomegaly palpable  Extremities:  BL LE: No pretibial edema normal ROM and strength.  Mental Status: Judgment, insight: Intact Orientation: Oriented to time, place, and person Mood and affect: No  depression, anxiety, or agitation     DATA REVIEWED:     Latest Reference Range & Units 03/22/22 11:09  TSH 0.35 - 5.50 uIU/mL 0.70  T4,Free(Direct) 0.60 - 1.60 ng/dL 1.61      Latest Reference Range & Units 11/28/21 07:31  Sodium 135 - 145 mmol/L 139  Potassium 3.5 - 5.1 mmol/L 3.3 (L)  Chloride 98 - 111 mmol/L 106  CO2 22 - 32 mmol/L 25  Glucose 70 - 99 mg/dL 096 (H)  BUN 6 - 20 mg/dL 11  Creatinine 0.45 - 4.09 mg/dL 8.11  Calcium 8.9 - 91.4 mg/dL 8.4 (L)  Anion gap 5 - 15  8  GFR, Estimated >60 mL/min >60   Thyroid ultrasound 12/23/2021   Estimated total number of nodules >/=  1 cm: 2   Number of spongiform nodules >/=  2 cm not described below (TR1): 0   Number of mixed cystic and solid nodules >/= 1.5 cm not described below (TR2): 0   _________________________________________________________   Nodule # 1:   Location: Right; Superior   Maximum size: 1.5 cm; Other 2 dimensions: 1.3 x 1.1 cm   Composition: solid/almost completely solid (2)   Echogenicity: hypoechoic (2)   Shape: not taller-than-wide (0)   Margins: lobulated/irregular (2)   Echogenic foci: macrocalcifications (1)   ACR TI-RADS total points: 7.   ACR TI-RADS risk category: TR5 (>/= 7 points).   ACR TI-RADS recommendations:   **Given size (>/= 1.0 cm) and appearance, fine needle aspiration of this highly suspicious nodule should be considered based on TI-RADS criteria.   _________________________________________________________   Nodule # 3: 1 cm mixed cystic and solid nodule in the right inferior gland. This lesion does not meet criteria to warrant further evaluation.   Additional small subcentimeter thyroid nodules noted bilaterally. These also do not meet criteria for further evaluation.   IMPRESSION: 1. Approximally 1.5 cm TI-RADS 5 nodule in the right superior gland meets criteria to consider fine-needle aspiration biopsy. Biopsy is recommended. 2. Additional small nodules  noted incidentally which do not meet criteria for further evaluation.   ASSESSMENT/PLAN/RECOMMENDATIONS:   Subclinical Hyperthyroidism:  -She was started on methimazole in June 2023 due to multiple symptoms, TFTs normalized on repeat labs in August -Patiently today tells me she was without methimazole for approximately a week, but she has been back on it for the past week -TFTs are normal, no change   Medications : Continue methimazole 5 mg daily     2. Multinodular Goiter:  - No local neck symptoms  -Thyroid ultrasound on 12/23/2021 revealed multinodular goiter with a right superior 1.5 cm nodule meeting FNA criteria, patient declined FNA due to being very fearful of needles -We discussed serial monitoring of the nodule and having low threshold for hemithyroidectomy with increase in size - Will repeat ultrasound 12/2022    Follow-up in 6 months  Signed electronically by: Lyndle Herrlich, MD  [facility]  [facility] [address] [place] [place], [place] [address] Phone: [phone] FAX: [phone]   CC: Hoy Register, MD [address] [place] [address] Phone: [phone] Fax: [phone]   Return to Endocrinology clinic as below: Future Appointments  Date Time Provider Department Center  09/26/2022  7:30 AM Autumm Hattery, Konrad Dolores, MD [facility] None  09/29/2022 10:00 AM Imogene Burn, MD [facility]  10/02/2022  7:30 AM [facility] PROCEDURES 2 [facility]  10/17/2022  2:15 PM Jerene Bears, MD [facility]

## 2022-09-25 NOTE — Telephone Encounter (Signed)
Patient advised.

## 2022-09-26 ENCOUNTER — Encounter: Payer: Self-pay | Admitting: Internal Medicine

## 2022-09-26 ENCOUNTER — Ambulatory Visit: Payer: 59 | Admitting: Internal Medicine

## 2022-09-26 VITALS — BP 110/68 | HR 88 | Ht 67.0 in | Wt 208.0 lb

## 2022-09-26 DIAGNOSIS — E0590 Thyrotoxicosis, unspecified without thyrotoxic crisis or storm: Secondary | ICD-10-CM

## 2022-09-26 DIAGNOSIS — E042 Nontoxic multinodular goiter: Secondary | ICD-10-CM

## 2022-09-26 LAB — TSH: TSH: 1.59 u[IU]/mL (ref 0.35–5.50)

## 2022-09-26 LAB — T4, FREE: Free T4: 0.65 ng/dL (ref 0.60–1.60)

## 2022-09-27 MED ORDER — METHIMAZOLE 5 MG PO TABS: 5.0000 mg | ORAL_TABLET | ORAL | 3 refills | Status: DC

## 2022-09-28 ENCOUNTER — Telehealth: Payer: Self-pay | Admitting: Internal Medicine

## 2022-09-28 NOTE — Telephone Encounter (Signed)
Patient called and requested to reschedule her procedure, sad she is having to do an MRI as well and is overwhelmed with having to do a lot of things at the same time. She is requesting a call back to discuss Gerd symptoms.

## 2022-09-29 ENCOUNTER — Encounter: Payer: Self-pay | Admitting: Internal Medicine

## 2022-09-30 DIAGNOSIS — R519 Headache, unspecified: Secondary | ICD-10-CM | POA: Diagnosis not present

## 2022-09-30 DIAGNOSIS — M25461 Effusion, right knee: Secondary | ICD-10-CM | POA: Diagnosis not present

## 2022-09-30 DIAGNOSIS — J0100 Acute maxillary sinusitis, unspecified: Secondary | ICD-10-CM | POA: Diagnosis not present

## 2022-09-30 DIAGNOSIS — B349 Viral infection, unspecified: Secondary | ICD-10-CM | POA: Diagnosis not present

## 2022-09-30 DIAGNOSIS — M25561 Pain in right knee: Secondary | ICD-10-CM | POA: Diagnosis not present

## 2022-10-02 ENCOUNTER — Ambulatory Visit
Admission: RE | Admit: 2022-10-02 | Discharge: 2022-10-02 | Disposition: A | Discharge status: Home / Self Care | Payer: 59 | Source: Ambulatory Visit | Attending: Family Medicine | Admitting: Family Medicine

## 2022-10-02 DIAGNOSIS — R921 Mammographic calcification found on diagnostic imaging of breast: Secondary | ICD-10-CM

## 2022-10-02 HISTORY — PX: BREAST BIOPSY: SHX20

## 2022-10-02 NOTE — Telephone Encounter (Signed)
Patient rescheduled her Bravo.

## 2022-10-04 ENCOUNTER — Ambulatory Visit: Payer: 59

## 2022-10-04 ENCOUNTER — Telehealth: Payer: Self-pay | Admitting: Hematology and Oncology

## 2022-10-04 NOTE — Telephone Encounter (Signed)
Spoke to patient to confirm upcoming afternoon [facility] clinic appointment on 5/1, paperwork will be sent via mail.   Gave location and time, also informed patient that the surgeon's office would be calling as well to get information from them similar to the packet that they will be receiving so make sure to do both.  Reminded patient that all providers will be coming to the clinic to see them HERE and if they had any questions to not hesitate to reach back out to myself or their navigators.

## 2022-10-09 ENCOUNTER — Encounter: Payer: Self-pay | Admitting: *Deleted

## 2022-10-09 DIAGNOSIS — Z171 Estrogen receptor negative status [ER-]: Secondary | ICD-10-CM | POA: Insufficient documentation

## 2022-10-09 DIAGNOSIS — D0511 Intraductal carcinoma in situ of right breast: Secondary | ICD-10-CM

## 2022-10-10 NOTE — Progress Notes (Signed)
Radiation Oncology         [phone] ________________________________  Initial Outpatient Consultation  Name: Laurie Bell MRN: [id number]  Date: 10/11/2022  DOB: 05/26/1981  NW:GNFAOZ, Odette Horns, MD  Abigail Miyamoto, MD   REFERRING PHYSICIAN: Abigail Miyamoto, MD  DIAGNOSIS: No diagnosis found.   Cancer Staging  No matching staging information was found for the patient.  Stage 0 (cTis (DCIS), cN0, cM0) Right Breast UIQ, High grade DCIS, ER+ / PR+ / Her2 not assessed  CHIEF COMPLAINT: Here to discuss management of right breast DCIS  HISTORY OF PRESENT ILLNESS::Laurie Bell is a 42 y.o. female who presented with possible right breast calcifications on the following imaging: bilateral screening mammogram on the date of 08/24/22. No symptoms, if any, were reported at that time. Right breast diagnostic mammogram on 09/19/22 showed indeterminate calcifications within the upper slightly inner right breast at posterior depth, spanning approximately 10 mm.  Biopsy of the upper inner right breast on date of 10/02/22 showed high-grade DCIS measuring greater than 6 mm in the greatest linear extent of the sample, with necrosis and calcifications. ER status: 30% positive with weak staining intensity; PR status 2% positive with strong staining intensity; Her2 not assessed.   ***  PREVIOUS RADIATION THERAPY: {EXAM; YES/NO:19492::"No"}  PAST MEDICAL HISTORY:  has a past medical history of Anxiety, Arthritis, Chlamydia contact, treated, GERD (gastroesophageal reflux disease), and Hyperthyroidism.    PAST SURGICAL HISTORY: Past Surgical History:  Procedure Laterality Date   BREAST BIOPSY Right 10/02/2022   MM RT BREAST BX W LOC DEV 1ST LESION IMAGE BX SPEC STEREO GUIDE 10/02/2022 [facility]   ENDOMETRIAL ABLATION N/A 04/25/2017   Procedure: ENDOMETRIAL ABLATION With NOVASURE;  Surgeon: Adam Phenix, MD;  Location: [facility];  Service: Gynecology;   Laterality: N/A;   ESOPHAGEAL MANOMETRY N/A 07/27/2021   Procedure: ESOPHAGEAL MANOMETRY (EM);  Surgeon: Imogene Burn, MD;  Location: [facility];  Service: Gastroenterology;  Laterality: N/A;   TUBAL LIGATION  2003    FAMILY HISTORY: family history includes Breast cancer in her maternal grandmother; Cancer in her maternal grandmother; Hypertension in her maternal aunt and mother.  SOCIAL HISTORY:  reports that she has never smoked. She has never used smokeless tobacco. She reports that she does not currently use drugs after having used the following drugs: Marijuana. She reports that she does not drink alcohol.  ALLERGIES: Latex  MEDICATIONS:  Current Outpatient Medications  Medication Sig Dispense Refill   acetaminophen (TYLENOL) 500 MG tablet Take 1,000 mg by mouth every 6 (six) hours as needed for moderate pain.     AMBULATORY NON FORMULARY MEDICATION Medication Name: GI Cocktail-270 ml viscous Xylocaine 2% with 270 ml Dicyclomine 10mg /5 ml and 810 ml Mylanta SIG take 8-10 ml PO every 4-6 hours PRN severe stomach pain 1350 mL 0   ASHWAGANDHA PO Take 2 capsules by mouth 2 (two) times daily as needed (anxiety).     cyclobenzaprine (FLEXERIL) 10 MG tablet TAKE 1 TABLET(10 MG) BY MOUTH AT BEDTIME AS NEEDED FOR MUSCLE SPASMS 30 tablet 1   esomeprazole (NEXIUM) 40 MG capsule Take 1 capsule (40 mg total) by mouth in the morning and at bedtime. 60 capsule 5   famotidine (PEPCID) 20 MG tablet Take one tablet twice daily as needed 30 tablet 3   FLUoxetine (PROZAC) 40 MG capsule Take 1 capsule (40 mg total) by mouth daily. 30 capsule 3   fluticasone (FLONASE) 50 MCG/ACT nasal spray Place 2 sprays into both  nostrils daily. (Patient taking differently: Place 2 sprays into both nostrils daily as needed for allergies.) 16 g 0   hydrOXYzine (ATARAX) 25 MG tablet TAKE 1 TABLET(25 MG) BY MOUTH THREE TIMES DAILY 270 tablet 0   lidocaine (LIDODERM) 5 % Place 1 patch onto the skin daily. 30 patch 3    linaclotide (LINZESS) 145 MCG CAPS capsule Take 1 capsule (145 mcg total) by mouth daily before breakfast. 30 capsule 5   methimazole (TAPAZOLE) 5 MG tablet Take 1 tablet (5 mg total) by mouth as directed. 1 tablet Monday through Friday, skip Saturdays and  Sundays 60 tablet 3   naproxen (NAPROSYN) 500 MG tablet TAKE 1 TABLET (500 MG TOTAL) BY MOUTH 2 (TWO) TIMES DAILY AS NEEDED FOR PAIN 60 tablet 1   norethindrone (AYGESTIN) 5 MG tablet Take 1 tablet (5 mg total) by mouth 2 (two) times daily. 60 tablet 3   ondansetron (ZOFRAN-ODT) 4 MG disintegrating tablet Take 1 tablet (4 mg total) by mouth every 8 (eight) hours as needed for nausea or vomiting. 20 tablet 0   Phenylephrine-Acetaminophen (TYLENOL SINUS+HEADACHE PO) Take 2 tablets by mouth daily as needed (allergies).     potassium chloride (KLOR-CON) 10 MEQ tablet TAKE 1 TABLET(10 MEQ) BY MOUTH DAILY 90 tablet 0   pregabalin (LYRICA) 75 MG capsule Take 1 capsule (75 mg total) by mouth 3 (three) times daily. 90 capsule 1   No current facility-administered medications for this encounter.    REVIEW OF SYSTEMS: As above in HPI.   PHYSICAL EXAM:  vitals were not taken for this visit.   General: Alert and oriented, in no acute distress HEENT: Head is normocephalic. Extraocular movements are intact. Oropharynx is clear. Neck: Neck is supple, no palpable cervical or supraclavicular lymphadenopathy. Heart: Regular in rate and rhythm with no murmurs, rubs, or gallops. Chest: Clear to auscultation bilaterally, with no rhonchi, wheezes, or rales. Abdomen: Soft, nontender, nondistended, with no rigidity or guarding. Extremities: No cyanosis or edema. Lymphatics: see Neck Exam Skin: No concerning lesions. Musculoskeletal: symmetric strength and muscle tone throughout. Neurologic: Cranial nerves II through XII are grossly intact. No obvious focalities. Speech is fluent. Coordination is intact. Psychiatric: Judgment and insight are intact. Affect is  appropriate. Breasts: *** . No other palpable masses appreciated in the breasts or axillae *** .    ECOG = ***  0 - Asymptomatic (Fully active, able to carry on all predisease activities without restriction)  1 - Symptomatic but completely ambulatory (Restricted in physically strenuous activity but ambulatory and able to carry out work of a light or sedentary nature. For example, light housework, office work)  2 - Symptomatic, <50% in bed during the day (Ambulatory and capable of all self care but unable to carry out any work activities. Up and about more than 50% of waking hours)  3 - Symptomatic, >50% in bed, but not bedbound (Capable of only limited self-care, confined to bed or chair 50% or more of waking hours)  4 - Bedbound (Completely disabled. Cannot carry on any self-care. Totally confined to bed or chair)  5 - Death   Santiago Glad MM, Creech RH, Tormey DC, et al. [phone]). "Toxicity and response criteria of the [facility]". Am. Evlyn Clines. Oncol. 5 (6): 649-55   LABORATORY DATA:  Lab Results  Component Value Date   WBC 9.7 02/04/2022   HGB 12.8 02/04/2022   HCT 37.8 02/04/2022   MCV 85.5 02/04/2022   PLT 244 02/04/2022   CMP  Component Value Date/Time   NA 138 02/04/2022 0423   NA 138 01/18/2022 1512   K 3.0 (L) 02/04/2022 0423   CL 108 02/04/2022 0423   CO2 24 02/04/2022 0423   GLUCOSE 94 02/04/2022 0423   BUN 13 02/04/2022 0423   BUN 9 01/18/2022 1512   CREATININE 0.83 02/04/2022 0423   CALCIUM 8.8 (L) 02/04/2022 0423   PROT 7.1 02/04/2022 0423   PROT 6.9 04/15/2021 0942   ALBUMIN 3.7 02/04/2022 0423   ALBUMIN 4.3 04/15/2021 0942   AST 14 (L) 02/04/2022 0423   ALT 12 02/04/2022 0423   ALKPHOS 95 02/04/2022 0423   BILITOT 0.4 02/04/2022 0423   BILITOT 0.6 04/15/2021 0942   GFRNONAA >60 02/04/2022 0423   GFRAA >60 02/01/2020 2233         RADIOGRAPHY: MM RT BREAST BX W LOC DEV 1ST LESION IMAGE BX SPEC STEREO GUIDE  Addendum Date:  10/06/2022   ADDENDUM REPORT: 10/06/2022 17:51 ADDENDUM: 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 This was found to be concordant by Dr. Gerome Sam. Pathology results were discussed with the patient by telephone. The patient reported doing well after the biopsy with tenderness at the site. Post biopsy instructions and care were reviewed and questions were answered. The patient was encouraged to call [facility] for any additional concerns. My direct phone number was provided. The patient was referred to [facility] on Oct 11, 2022. Consideration for a bilateral breast MRI for further evaluation of extent of disease given the high grade histology, age, family history and breast density, (D). Pathology results reported by Rene Kocher, RN on 10/06/2022. Electronically Signed   By: Gerome Sam III M.D.   On: 10/06/2022 17:51   Result Date: 10/06/2022 CLINICAL DATA:  Stereotactic biopsy of right breast calcifications EXAM: RIGHT BREAST STEREOTACTIC CORE NEEDLE BIOPSY COMPARISON:  Previous exam(s). FINDINGS: The patient and I discussed the procedure of stereotactic-guided biopsy including benefits and alternatives. We discussed the high likelihood of a successful procedure. We discussed the risks of the procedure including infection, bleeding, tissue injury, clip migration, and inadequate sampling. Informed written consent was given. The usual time out protocol was performed immediately prior to the procedure. Using sterile technique and 1% Lidocaine as local anesthetic, under stereotactic guidance, a 9 gauge vacuum assisted device was used to perform core needle biopsy of calcifications in the upper inner right breast using a lateral approach. The patient did move somewhat  resulting in no calcifications in the first 12 specimens. The needle was moved while within the patient and at least 2 calcifications were identified. Assessing the post clip film, I suspect there are likely more calcifications not visible on the specimen radiographs given the air in the radiographs from the first samples. As a result, if the biopsy is benign, a six-month follow-up will be recommended. Lesion quadrant: Upper inner right breast At the conclusion of the procedure, a coil shaped tissue marker clip was deployed into the biopsy cavity. Follow-up 2-view mammogram was performed and dictated separately. IMPRESSION: Stereotactic-guided biopsy of calcifications in the upper inner right breast. No apparent complications. Electronically Signed: By: Gerome Sam III M.D. On: 10/02/2022 08:47  MM CLIP PLACEMENT RIGHT  Result Date: 10/02/2022 CLINICAL DATA:  Evaluate biopsy clip EXAM: 3D DIAGNOSTIC RIGHT MAMMOGRAM POST STEREOTACTIC BIOPSY COMPARISON:  Previous exam(s). FINDINGS: 3D Mammographic images were obtained following stereotactic guided biopsy of right breast calcifications. The biopsy marking clip migrated 8 mm medial to the biopsied calcifications. The clip is along the anterior aspect of the biopsied calcifications. IMPRESSION: The biopsy marking clip migrated 8 mm medial to the biopsied calcifications. The clip is along the anterior aspect of the biopsied calcifications. Final Assessment: Post Procedure Mammograms for Marker Placement Electronically Signed   By: Gerome Sam III M.D.   On: 10/02/2022 08:50  MM Digital Diagnostic Unilat R  Result Date: 09/19/2022 CLINICAL DATA:  Patient returns today to evaluate RIGHT breast calcifications identified on recent screening mammogram. EXAM: DIGITAL DIAGNOSTIC UNILATERAL RIGHT MAMMOGRAM TECHNIQUE: Right digital diagnostic mammography was performed. COMPARISON:  Previous exam(s). ACR Breast Density Category d: The breasts are extremely dense,  which lowers the sensitivity of mammography. FINDINGS: On today's additional diagnostic views, including magnification views, grouped punctate and coarse heterogeneous calcifications are confirmed within the upper slightly inner RIGHT breast, at posterior depth, spanning 10 mm. IMPRESSION: Indeterminate calcifications within the upper RIGHT breast, at posterior depth, spanning 10 mm. Stereotactic biopsy is recommended to exclude malignancy. RECOMMENDATION: Stereotactic biopsy for RIGHT breast calcifications. Stereotactic biopsy will be scheduled at patient's convenience. I have discussed the findings and recommendations with the patient. If applicable, a reminder letter will be sent to the patient regarding the next appointment. BI-RADS CATEGORY  4: Suspicious. Electronically Signed   By: Bary Richard M.D.   On: 09/19/2022 13:15     IMPRESSION/PLAN: ***   It was a pleasure meeting the patient today. We discussed the risks, benefits, and side effects of radiotherapy. I recommend radiotherapy to the *** to reduce her risk of locoregional recurrence by 2/3.  We discussed that radiation would take approximately *** weeks to complete and that I would give the patient a few weeks to heal following surgery before starting treatment planning. *** If chemotherapy were to be given, this would precede radiotherapy. We spoke about acute effects including skin irritation and fatigue as well as much less common late effects including internal organ injury or irritation. We spoke about the latest technology that is used to minimize the risk of late effects for patients undergoing radiotherapy to the breast or chest wall. No guarantees of treatment were given. The patient is enthusiastic about proceeding with treatment. I look forward to participating in the patient's care.  I will await her referral back to me for postoperative follow-up and eventual CT simulation/treatment planning.  On date of service, in total, I spent  *** minutes on this encounter. Patient was seen in person.   __________________________________________   Lonie Peak, MD  This document serves as a record of services personally performed by Lonie Peak, MD. It was created on her behalf by Neena Rhymes, a trained medical scribe. The creation of this record is based on the scribe's personal observations and the provider's statements to them. This document has been checked and approved by the attending provider.

## 2022-10-11 ENCOUNTER — Other Ambulatory Visit: Payer: Self-pay | Admitting: Surgery

## 2022-10-11 ENCOUNTER — Inpatient Hospital Stay (HOSPITAL_BASED_OUTPATIENT_CLINIC_OR_DEPARTMENT_OTHER): Payer: 59 | Admitting: Hematology and Oncology

## 2022-10-11 ENCOUNTER — Encounter: Payer: Self-pay | Admitting: Genetic Counselor

## 2022-10-11 ENCOUNTER — Ambulatory Visit: Payer: 59 | Admitting: Physical Therapy

## 2022-10-11 ENCOUNTER — Encounter: Payer: Self-pay | Admitting: General Practice

## 2022-10-11 ENCOUNTER — Inpatient Hospital Stay: Payer: 59 | Attending: Hematology and Oncology

## 2022-10-11 ENCOUNTER — Inpatient Hospital Stay (HOSPITAL_BASED_OUTPATIENT_CLINIC_OR_DEPARTMENT_OTHER): Payer: 59 | Admitting: Genetic Counselor

## 2022-10-11 ENCOUNTER — Ambulatory Visit
Admission: RE | Admit: 2022-10-11 | Discharge: 2022-10-11 | Disposition: A | Discharge status: Home / Self Care | Payer: 59 | Source: Ambulatory Visit | Attending: Radiation Oncology | Admitting: Radiation Oncology

## 2022-10-11 ENCOUNTER — Encounter: Payer: Self-pay | Admitting: Hematology and Oncology

## 2022-10-11 VITALS — BP 134/94 | HR 94 | Temp 97.9°F | Resp 18 | Ht 67.0 in | Wt 208.7 lb

## 2022-10-11 DIAGNOSIS — Z803 Family history of malignant neoplasm of breast: Secondary | ICD-10-CM

## 2022-10-11 DIAGNOSIS — Z170 Estrogen receptor positive status [ER+]: Secondary | ICD-10-CM | POA: Diagnosis not present

## 2022-10-11 DIAGNOSIS — D0511 Intraductal carcinoma in situ of right breast: Secondary | ICD-10-CM

## 2022-10-11 LAB — CMP (CANCER CENTER ONLY)
ALT: 11 U/L (ref 0–44)
AST: 13 U/L — ABNORMAL LOW (ref 15–41)
Albumin: 4.2 g/dL (ref 3.5–5.0)
Alkaline Phosphatase: 146 U/L — ABNORMAL HIGH (ref 38–126)
Anion gap: 6 (ref 5–15)
BUN: 10 mg/dL (ref 6–20)
CO2: 28 mmol/L (ref 22–32)
Calcium: 9.1 mg/dL (ref 8.9–10.3)
Chloride: 104 mmol/L (ref 98–111)
Creatinine: 0.78 mg/dL (ref 0.44–1.00)
GFR, Estimated: >60 mL/min (ref >60)
Glucose, Bld: 111 mg/dL — ABNORMAL HIGH (ref 70–99)
Potassium: 3.6 mmol/L (ref 3.5–5.1)
Sodium: 138 mmol/L (ref 135–145)
Total Bilirubin: 0.5 mg/dL (ref 0.3–1.2)
Total Protein: 7.4 g/dL (ref 6.5–8.1)

## 2022-10-11 LAB — CBC WITH DIFFERENTIAL (CANCER CENTER ONLY)
Abs Immature Granulocytes: 0.03 10*3/uL (ref 0.00–0.07)
Basophils Absolute: 0 10*3/uL (ref 0.0–0.1)
Basophils Relative: 0 %
Eosinophils Absolute: 0.1 10*3/uL (ref 0.0–0.5)
Eosinophils Relative: 2 %
HCT: 38.1 % (ref 36.0–46.0)
Hemoglobin: 13 g/dL (ref 12.0–15.0)
Immature Granulocytes: 0 %
Lymphocytes Relative: 39 %
Lymphs Abs: 3.1 10*3/uL (ref 0.7–4.0)
MCH: 28.6 pg (ref 26.0–34.0)
MCHC: 34.1 g/dL (ref 30.0–36.0)
MCV: 83.9 fL (ref 80.0–100.0)
Monocytes Absolute: 0.6 10*3/uL (ref 0.1–1.0)
Monocytes Relative: 7 %
Neutro Abs: 4.1 10*3/uL (ref 1.7–7.7)
Neutrophils Relative %: 52 %
Platelet Count: 286 10*3/uL (ref 150–400)
RBC: 4.54 MIL/uL (ref 3.87–5.11)
RDW: 13 % (ref 11.5–15.5)
WBC Count: 7.9 10*3/uL (ref 4.0–10.5)
nRBC: 0 % (ref 0.0–0.2)

## 2022-10-11 LAB — GENETIC SCREENING ORDER

## 2022-10-11 NOTE — Progress Notes (Signed)
[facility] CONSULT NOTE  Patient Care Team: Hoy Register, MD as PCP - General (Family Medicine) Corky Crafts, MD as PCP - Cardiology (Cardiology) Glendale Chard, DO as Consulting Physician (Neurology) Abigail Miyamoto, MD as Consulting Physician (General Surgery) Serena Croissant, MD as Consulting Physician (Hematology and Oncology) Lonie Peak, MD as Attending Physician (Radiation Oncology) Pershing Proud, RN as Oncology Nurse Navigator Donnelly Angelica, RN as Oncology Nurse Navigator  CHIEF COMPLAINTS/PURPOSE OF CONSULTATION:  Newly diagnosed   HISTORY OF PRESENTING ILLNESS:  Laurie Bell 42 y.o. female is here because of recent diagnosis of right breast DCIS.  Patient had a screening mammogram detected right breast calcifications measuring 1 cm.  Stereotactic biopsy revealed grade 3 DCIS with suspicion of focal microinvasion ER 30% weak and PR 2%.  She was presented this morning to the multidisciplinary tumor board and she is here today accompanied by her family to discuss her treatment plan.  I reviewed her records extensively and collaborated the history with the patient.  SUMMARY OF ONCOLOGIC HISTORY: Oncology History  Ductal carcinoma in situ (DCIS) of right breast  10/02/2022 Initial Diagnosis   Screening mammogram detected right breast calcifications measuring 1 cm, 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, ER 30% weak, PR 2%   10/11/2022 Cancer Staging   Staging form: Breast, AJCC 8th Edition - Clinical: Stage 0 (cTis (DCIS), cN0, cM0, G3, ER+, PR+, HER2: Not Assessed) - Signed by Serena Croissant, MD on 10/11/2022 Stage prefix: Initial diagnosis Histologic grading system: 3 grade system      MEDICAL HISTORY:  Past Medical History:  Diagnosis Date   Anxiety    Arthritis    Breast cancer (HCC)    Chlamydia contact, treated    GERD (gastroesophageal reflux disease)    Hyperthyroidism     SURGICAL  HISTORY: Past Surgical History:  Procedure Laterality Date   BREAST BIOPSY Right 10/02/2022   MM RT BREAST BX W LOC DEV 1ST LESION IMAGE BX SPEC STEREO GUIDE 10/02/2022 [facility] MAMMOGRAPHY   ENDOMETRIAL ABLATION N/A 04/25/2017   Procedure: ENDOMETRIAL ABLATION With NOVASURE;  Surgeon: Adam Phenix, MD;  Location: [facility];  Service: Gynecology;  Laterality: N/A;   ESOPHAGEAL MANOMETRY N/A 07/27/2021   Procedure: ESOPHAGEAL MANOMETRY (EM);  Surgeon: Imogene Burn, MD;  Location: [facility];  Service: Gastroenterology;  Laterality: N/A;   TUBAL LIGATION  2003    SOCIAL HISTORY: Social History   Socioeconomic History   Marital status: Single    Spouse name: Not on file   Number of children: 2   Years of education: Not on file   Highest education level: Not on file  Occupational History   Occupation: Event organiser: [employer]  Tobacco Use   Smoking status: Never   Smokeless tobacco: Never  Vaping Use   Vaping Use: Never used  Substance and Sexual Activity   Alcohol use: No   Drug use: Not Currently    Types: Marijuana    Comment: quit 2018   Sexual activity: Not Currently    Birth control/protection: Surgical  Other Topics Concern   Not on file  Social History Narrative   ** Merged History Encounter **       Right Handed  Lives in a one story home    Social Determinants of Health   Financial Resource Strain: Medium Risk (11/03/2021)   Overall Financial Resource Strain (CARDIA)    Difficulty of Paying  Living Expenses: Somewhat hard  Food Insecurity: No Food Insecurity (06/16/2021)   Hunger Vital Sign    Worried About Running Out of Food in the Last Year: Never true    Ran Out of Food in the Last Year: Never true  Transportation Needs: No Transportation Needs (11/03/2021)   PRAPARE - Administrator, Civil Service (Medical): No    Lack of Transportation (Non-Medical): No  Physical Activity: Sufficiently Active (11/03/2021)   Exercise  Vital Sign    Days of Exercise per Week: 3 days    Minutes of Exercise per Session: 50 min  Stress: Stress Concern Present (11/03/2021)   [employer] of Occupational Health - Occupational Stress Questionnaire    Feeling of Stress : Very much  Social Connections: Moderately Isolated (11/03/2021)   Social Connection and Isolation Panel [NHANES]    Frequency of Communication with Friends and Family: Once a week    Frequency of Social Gatherings with Friends and Family: Once a week    Attends Religious Services: More than 4 times per year    Active Member of [employer] or Organizations: No    Attends Banker Meetings: Never    Marital Status: Living with partner  Intimate Partner Violence: Not At Risk (11/03/2021)   Humiliation, Afraid, Rape, and Kick questionnaire    Fear of Current or Ex-Partner: No    Emotionally Abused: No    Physically Abused: No    Sexually Abused: No    FAMILY HISTORY: Family History  Problem Relation Age of Onset   Hypertension Mother    Hypertension Maternal Aunt    Breast cancer Maternal Grandmother    Cancer Maternal Grandmother        type unknown   Esophageal cancer Neg Hx    Rectal cancer Neg Hx    Stomach cancer Neg Hx    Colon cancer Neg Hx     ALLERGIES:  is allergic to latex.  MEDICATIONS:  Current Outpatient Medications  Medication Sig Dispense Refill   acetaminophen (TYLENOL) 500 MG tablet Take 1,000 mg by mouth every 6 (six) hours as needed for moderate pain.     AMBULATORY NON FORMULARY MEDICATION Medication Name: GI Cocktail-270 ml viscous Xylocaine 2% with 270 ml Dicyclomine 10mg /5 ml and 810 ml Mylanta SIG take 8-10 ml PO every 4-6 hours PRN severe stomach pain 1350 mL 0   ASHWAGANDHA PO Take 2 capsules by mouth 2 (two) times daily as needed (anxiety).     cyclobenzaprine (FLEXERIL) 10 MG tablet TAKE 1 TABLET(10 MG) BY MOUTH AT BEDTIME AS NEEDED FOR MUSCLE SPASMS 30 tablet 1   esomeprazole (NEXIUM) 40 MG capsule Take 1  capsule (40 mg total) by mouth in the morning and at bedtime. 60 capsule 5   famotidine (PEPCID) 20 MG tablet Take one tablet twice daily as needed 30 tablet 3   FLUoxetine (PROZAC) 40 MG capsule Take 1 capsule (40 mg total) by mouth daily. 30 capsule 3   fluticasone (FLONASE) 50 MCG/ACT nasal spray Place 2 sprays into both nostrils daily. (Patient taking differently: Place 2 sprays into both nostrils daily as needed for allergies.) 16 g 0   hydrOXYzine (ATARAX) 25 MG tablet TAKE 1 TABLET(25 MG) BY MOUTH THREE TIMES DAILY 270 tablet 0   lidocaine (LIDODERM) 5 % Place 1 patch onto the skin daily. 30 patch 3   linaclotide (LINZESS) 145 MCG CAPS capsule Take 1 capsule (145 mcg total) by mouth daily before breakfast. 30 capsule 5  methimazole (TAPAZOLE) 5 MG tablet Take 1 tablet (5 mg total) by mouth as directed. 1 tablet Monday through Friday, skip Saturdays and  Sundays 60 tablet 3   naproxen (NAPROSYN) 500 MG tablet TAKE 1 TABLET (500 MG TOTAL) BY MOUTH 2 (TWO) TIMES DAILY AS NEEDED FOR PAIN 60 tablet 1   norethindrone (AYGESTIN) 5 MG tablet Take 1 tablet (5 mg total) by mouth 2 (two) times daily. 60 tablet 3   ondansetron (ZOFRAN-ODT) 4 MG disintegrating tablet Take 1 tablet (4 mg total) by mouth every 8 (eight) hours as needed for nausea or vomiting. 20 tablet 0   Phenylephrine-Acetaminophen (TYLENOL SINUS+HEADACHE PO) Take 2 tablets by mouth daily as needed (allergies).     potassium chloride (KLOR-CON) 10 MEQ tablet TAKE 1 TABLET(10 MEQ) BY MOUTH DAILY 90 tablet 0   pregabalin (LYRICA) 75 MG capsule Take 1 capsule (75 mg total) by mouth 3 (three) times daily. 90 capsule 1   No current facility-administered medications for this visit.    REVIEW OF SYSTEMS:   Constitutional: Denies fevers, chills or abnormal night sweats  All other systems were reviewed with the patient and are negative.  PHYSICAL EXAMINATION: ECOG PERFORMANCE STATUS: 1 - Symptomatic but completely ambulatory  Vitals:    10/11/22 1304  BP: (!) 134/94  Pulse: 94  Resp: 18  Temp: 97.9 F (36.6 C)  SpO2: 99%   Filed Weights   10/11/22 1304  Weight: 208 lb 11.2 oz (94.7 kg)    GENERAL:alert, no distress and comfortable    LABORATORY DATA:  I have reviewed the data as listed Lab Results  Component Value Date   WBC 7.9 10/11/2022   HGB 13.0 10/11/2022   HCT 38.1 10/11/2022   MCV 83.9 10/11/2022   PLT 286 10/11/2022   Lab Results  Component Value Date   NA 138 10/11/2022   K 3.6 10/11/2022   CL 104 10/11/2022   CO2 28 10/11/2022    RADIOGRAPHIC STUDIES: I have personally reviewed the radiological reports and agreed with the findings in the report.  ASSESSMENT AND PLAN:  Ductal carcinoma in situ (DCIS) of right breast 10/02/2022:Screening mammogram detected right breast calcifications measuring 1 cm, 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, ER 30% weak, PR 2%  Pathology review: I discussed with the patient the difference between DCIS and invasive breast cancer. It is considered a precancerous lesion. DCIS is classified as a 0. It is generally detected through mammograms as calcifications. We discussed the significance of grades and its impact on prognosis. We also discussed the importance of ER and PR receptors and their implications to adjuvant treatment options. Prognosis of DCIS dependence on grade, comedo necrosis. It is anticipated that if not treated, 20-30% of DCIS can develop into invasive breast cancer.  Recommendation: 1. Breast conserving surgery 2. Followed by adjuvant radiation therapy 3. Followed by antiestrogen therapy with tamoxifen 5 years  Patient was offered genetic testing but she declined  Tamoxifen counseling: We discussed the risks and benefits of tamoxifen. These include but not limited to insomnia, hot flashes, mood changes, vaginal dryness, and weight gain. Although rare, serious side effects including endometrial cancer, risk  of blood clots were also discussed. We strongly believe that the benefits far outweigh the risks. Patient understands these risks and consented to starting treatment. Planned treatment duration is 5 years.  Return to clinic after surgery to discuss the final pathology report and come up with an adjuvant treatment plan. We would like  to repeat prognostic markers on the final pathology to make sure that the ER is truly positive.   All questions were answered. The patient knows to call the clinic with any problems, questions or concerns.    Tamsen Meek, MD 10/11/22

## 2022-10-11 NOTE — Progress Notes (Signed)
[facility] Psychosocial Distress Screening Spiritual Care  Met with Laurie Bell, her mom, one aunt in person, and one aunt via phone in Breast Multidisciplinary Clinic to introduce Support Center team/resources, reviewing distress screen per protocol.  The patient scored a 10 on the Psychosocial Distress Thermometer which indicates severe distress. Also assessed for distress and other psychosocial needs.      10/11/2022    5:01 PM  ON[facility]N DISTRESS SCREENING  Screening Type Initial Screening  Distress experienced in past week (1-10) 10  Emotional problem type Nervousness/Anxiety;Adjusting to illness;Adjusting to appearance changes  Spiritual/Religous concerns type Relating to God  Physical Problem type Pain;Sleep/insomnia;Breathing;Tingling hands/feet;Swollen arms/legs  Referral to support programs Yes    Chaplain and patient discussed common feelings and emotions when being diagnosed with cancer, and the importance of support during treatment.  Chaplain informed patient of the support team and support services at [facility].  Chaplain provided contact information and encouraged patient to call with any questions or concerns.  Laurie Bell notes that she tends to "stress [significantly] about other peoples' problems, as well as [her own]," so her distress about her breast cancer diagnosis is high. She is interested in exploring [facility] support programming as a way to cope and make meaning through this experience.  Follow up needed: Yes.  We plan to follow up by phone in two weeks. Will also place referrals for social work and an Sports coach per her request.   Laurie Bell, [facility] Pager [phone] Voicemail [phone]

## 2022-10-11 NOTE — Progress Notes (Signed)
REFERRING PROVIDER: Serena Croissant, MD [address] [place],  [place] [address]  PRIMARY PROVIDER:  Hoy Register, MD  PRIMARY REASON FOR VISIT:  Encounter Diagnoses  Name Primary?   Ductal carcinoma in situ (DCIS) of right breast Yes   Family history of breast cancer      HISTORY OF PRESENT ILLNESS:   Laurie Bell, a 42 y.o. female, was seen for a genetics consultation during the breast multidisciplinary clinic.    In April 2024, at the age of 38, Laurie Bell was diagnosed with ductal carcinoma in situ of the right breast (ER+/PR+). The treatment plan is pending.  Laurie Bell declined genetic testing during her medical oncology appointment with Dr. Pamelia Hoit.   CANCER HISTORY:  Oncology History  Ductal carcinoma in situ (DCIS) of right breast  10/02/2022 Initial Diagnosis   Screening mammogram detected right breast calcifications measuring 1 cm, 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, ER 30% weak, PR 2%   10/11/2022 Cancer Staging   Staging form: Breast, AJCC 8th Edition - Clinical: Stage 0 (cTis (DCIS), cN0, cM0, G3, ER+, PR+, HER2: Not Assessed) - Signed by Serena Croissant, MD on 10/11/2022 Stage prefix: Initial diagnosis Histologic grading system: 3 grade system    FAMILY HISTORY:  We obtained a detailed, 4-generation family history.  Significant diagnoses are listed below: Family History  Problem Relation Age of Onset   Breast cancer Maternal Grandmother        dx > 50   Stomach cancer Maternal Grandfather        dx > 50   Breast cancer Paternal Grandmother        dx > 50     GENETIC COUNSELING ASSESSMENT: Laurie Bell is a 42 y.o. female with a personal history of breast cancer which is somewhat suggestive of a hereditary cancer syndrome and predisposition to cancer given her age of diagnosis and the presence of related cancers in the family.   DISCUSSION:  Laurie Bell did not wish to discuss genetic testing beyond her  appointment with Dr. Pamelia Hoit.  She does not wish to proceed with genetic testing at this time.  Our contact information was provided in the event that she has questions or wishes to proceed with genetic testing in the future.    Individuals in this family might be at some increased risk of developing cancer, over the general population risk, due to the family history of cancer.  Individuals in the family should notify their providers of the family history of cancer. We recommend women in this family have a yearly mammogram beginning at age 7, or 73 years younger than the earliest onset of cancer, an annual clinical breast exam, and perform monthly breast self-exams.  Risk models that take into account family history, hormonal history, and breast density may be helpful in determining appropriate breast cancer screening options for family members.   Laurie Bell M. Rennie Plowman, MS, [facility] Genetic Counselor [email or web address] (P) [phone]      Face-to-face counseling provided for less than 5 minutes.

## 2022-10-11 NOTE — Assessment & Plan Note (Signed)
10/02/2022:Screening mammogram detected right breast calcifications measuring 1 cm, 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, ER 30% weak, PR 2%  Pathology review: I discussed with the patient the difference between DCIS and invasive breast cancer. It is considered a precancerous lesion. DCIS is classified as a 0. It is generally detected through mammograms as calcifications. We discussed the significance of grades and its impact on prognosis. We also discussed the importance of ER and PR receptors and their implications to adjuvant treatment options. Prognosis of DCIS dependence on grade, comedo necrosis. It is anticipated that if not treated, 20-30% of DCIS can develop into invasive breast cancer.  Recommendation: 1. Breast conserving surgery 2. Followed by adjuvant radiation therapy 3. Followed by antiestrogen therapy with tamoxifen 5 years  Tamoxifen counseling: We discussed the risks and benefits of tamoxifen. These include but not limited to insomnia, hot flashes, mood changes, vaginal dryness, and weight gain. Although rare, serious side effects including endometrial cancer, risk of blood clots were also discussed. We strongly believe that the benefits far outweigh the risks. Patient understands these risks and consented to starting treatment. Planned treatment duration is 5 years.  Return to clinic after surgery to discuss the final pathology report and come up with an adjuvant treatment plan. We would like to repeat prognostic markers on the final pathology to make sure that the ER is truly positive.

## 2022-10-12 ENCOUNTER — Telehealth: Payer: Self-pay | Admitting: *Deleted

## 2022-10-12 ENCOUNTER — Encounter: Payer: Self-pay | Admitting: Radiation Oncology

## 2022-10-12 NOTE — Telephone Encounter (Signed)
Received call from pt requesting to review recent lab work.  RN reviewed labs, pt educated and verbalized understanding.

## 2022-10-13 ENCOUNTER — Telehealth: Payer: Self-pay | Admitting: Licensed Clinical Social Worker

## 2022-10-13 ENCOUNTER — Other Ambulatory Visit: Payer: Self-pay | Admitting: Surgery

## 2022-10-13 ENCOUNTER — Inpatient Hospital Stay: Payer: 59 | Admitting: Licensed Clinical Social Worker

## 2022-10-13 DIAGNOSIS — D0511 Intraductal carcinoma in situ of right breast: Secondary | ICD-10-CM

## 2022-10-13 NOTE — Progress Notes (Signed)
[facility] Clinical Social Work  Clinical Social Work was referred by spiritual care for assessment of psychosocial needs.  Clinical Social Worker contacted patient by phone to offer support and assess for needs.    Patient works as a Production designer, theatre/television/film at [employer] and is concerned with upcoming surgery and radiation about financial wellbeing as she will have a reduction in income.  She is primarily living alone, although son is sometimes at home. CSW shared information on [employer] and [employer] in [place] for potential assistance and also sent pt information on other foundations that may have funding in a few months.  Pt will also be interested in seeing if she is eligible for the [employer] grant during radiation treatments.    No questions from pt at this time. She will contact CSW as she looks over the information provided today and reach out with any concerns or assistance needed.     Aniaya Bacha E Schawn Byas, LCSW  Clinical Social Worker [facility]

## 2022-10-13 NOTE — Telephone Encounter (Signed)
[facility] Clinical Social Work  Clinical Social Work was referred by spiritual care for assessment of psychosocial needs due to pt inquiry re: gas cards.  Clinical Social Worker attempted to contact patient by phone to offer support and assess for needs.   No answer. Left VM with direct contact information.     Bevelyn Arriola E Jung Ingerson, LCSW  Clinical Social Worker [facility]

## 2022-10-14 DIAGNOSIS — M5412 Radiculopathy, cervical region: Secondary | ICD-10-CM | POA: Diagnosis not present

## 2022-10-14 DIAGNOSIS — M19012 Primary osteoarthritis, left shoulder: Secondary | ICD-10-CM | POA: Diagnosis not present

## 2022-10-14 DIAGNOSIS — J0100 Acute maxillary sinusitis, unspecified: Secondary | ICD-10-CM | POA: Diagnosis not present

## 2022-10-14 DIAGNOSIS — M62830 Muscle spasm of back: Secondary | ICD-10-CM | POA: Diagnosis not present

## 2022-10-16 ENCOUNTER — Encounter: Payer: 59 | Admitting: Podiatry

## 2022-10-17 ENCOUNTER — Telehealth (HOSPITAL_BASED_OUTPATIENT_CLINIC_OR_DEPARTMENT_OTHER): Payer: Self-pay | Admitting: Obstetrics & Gynecology

## 2022-10-17 ENCOUNTER — Ambulatory Visit (HOSPITAL_BASED_OUTPATIENT_CLINIC_OR_DEPARTMENT_OTHER): Payer: 59 | Admitting: Obstetrics & Gynecology

## 2022-10-17 NOTE — Telephone Encounter (Signed)
Called patient and left a message to please call the office back about today's missed appointment.  

## 2022-10-18 ENCOUNTER — Encounter: Payer: Self-pay | Admitting: *Deleted

## 2022-10-18 ENCOUNTER — Telehealth: Payer: Self-pay | Admitting: Radiation Oncology

## 2022-10-18 DIAGNOSIS — D0511 Intraductal carcinoma in situ of right breast: Secondary | ICD-10-CM

## 2022-10-18 NOTE — Telephone Encounter (Signed)
5/8 @ 1:33 pm Left voicemail for patient to call our office to be schedule for f/u consult with ct sim.

## 2022-10-20 ENCOUNTER — Telehealth: Payer: Self-pay | Admitting: *Deleted

## 2022-10-20 ENCOUNTER — Telehealth: Payer: Self-pay | Admitting: Hematology and Oncology

## 2022-10-20 NOTE — Telephone Encounter (Signed)
Scheduled appointment per scheduling message. Left voicemail.  

## 2022-10-20 NOTE — Telephone Encounter (Signed)
Spoke to pt concerning [facility] from 5.1.24. Denies questions or concerns regarding dx or treatment care plan. Encourage pt to call with needs. Received verbal understanding. 

## 2022-10-23 ENCOUNTER — Ambulatory Visit
Admission: RE | Admit: 2022-10-23 | Discharge: 2022-10-23 | Disposition: A | Discharge status: Home / Self Care | Payer: 59 | Source: Ambulatory Visit | Attending: Internal Medicine | Admitting: Internal Medicine

## 2022-10-23 DIAGNOSIS — E041 Nontoxic single thyroid nodule: Secondary | ICD-10-CM | POA: Diagnosis not present

## 2022-10-23 DIAGNOSIS — E042 Nontoxic multinodular goiter: Secondary | ICD-10-CM

## 2022-10-25 ENCOUNTER — Encounter: Payer: Self-pay | Admitting: General Practice

## 2022-10-25 NOTE — Progress Notes (Signed)
[facility] Spiritual Care Note  Attempted [facility] follow-up call, leaving voicemail with direct number and encouragement to return call.   [id number] Rush Barer, [place], [facility] Pager [phone] Voicemail [phone]

## 2022-10-26 ENCOUNTER — Telehealth: Payer: Self-pay | Admitting: Internal Medicine

## 2022-10-26 NOTE — Telephone Encounter (Signed)
Patient would like to wait until after June 3rd when she does the breast biopsy. She would like a callback around that time

## 2022-10-26 NOTE — Telephone Encounter (Signed)
Please let the patient know that her thyroid ultrasound continues to show that she needs a biopsy of a thyroid nodule.   In the past she has declined because she was scared of the needles, but when I saw her last time she was getting ready to have a breast biopsy, and she was starting to think about it.   Please see if the patient has changed her mind about thyroid biopsy, it is a much simpler process than a breast biopsy and I would strongly encourage her to go through the process   Thanks

## 2022-10-30 ENCOUNTER — Other Ambulatory Visit: Payer: Self-pay | Admitting: Physical Medicine & Rehabilitation

## 2022-10-30 ENCOUNTER — Encounter: Payer: 59 | Admitting: Podiatry

## 2022-11-02 ENCOUNTER — Other Ambulatory Visit: Payer: Self-pay

## 2022-11-02 ENCOUNTER — Encounter (HOSPITAL_BASED_OUTPATIENT_CLINIC_OR_DEPARTMENT_OTHER): Payer: Self-pay | Admitting: Surgery

## 2022-11-04 ENCOUNTER — Other Ambulatory Visit: Payer: Self-pay

## 2022-11-04 ENCOUNTER — Emergency Department (HOSPITAL_COMMUNITY): Payer: 59

## 2022-11-04 ENCOUNTER — Encounter (HOSPITAL_COMMUNITY): Payer: Self-pay

## 2022-11-04 ENCOUNTER — Emergency Department (HOSPITAL_COMMUNITY)
Admission: EM | Admit: 2022-11-04 | Discharge: 2022-11-04 | Disposition: A | Discharge status: Home / Self Care | Payer: 59 | Attending: Emergency Medicine | Admitting: Emergency Medicine

## 2022-11-04 DIAGNOSIS — R001 Bradycardia, unspecified: Secondary | ICD-10-CM | POA: Diagnosis not present

## 2022-11-04 DIAGNOSIS — R079 Chest pain, unspecified: Secondary | ICD-10-CM | POA: Diagnosis not present

## 2022-11-04 DIAGNOSIS — R5383 Other fatigue: Secondary | ICD-10-CM | POA: Diagnosis not present

## 2022-11-04 DIAGNOSIS — R0602 Shortness of breath: Secondary | ICD-10-CM | POA: Insufficient documentation

## 2022-11-04 DIAGNOSIS — Z91040 Latex allergy status: Secondary | ICD-10-CM | POA: Insufficient documentation

## 2022-11-04 DIAGNOSIS — R209 Unspecified disturbances of skin sensation: Secondary | ICD-10-CM | POA: Insufficient documentation

## 2022-11-04 DIAGNOSIS — R0981 Nasal congestion: Secondary | ICD-10-CM | POA: Diagnosis not present

## 2022-11-04 DIAGNOSIS — Z853 Personal history of malignant neoplasm of breast: Secondary | ICD-10-CM | POA: Diagnosis not present

## 2022-11-04 DIAGNOSIS — R0789 Other chest pain: Secondary | ICD-10-CM | POA: Diagnosis not present

## 2022-11-04 DIAGNOSIS — O479 False labor, unspecified: Secondary | ICD-10-CM | POA: Diagnosis not present

## 2022-11-04 DIAGNOSIS — I499 Cardiac arrhythmia, unspecified: Secondary | ICD-10-CM | POA: Diagnosis not present

## 2022-11-04 DIAGNOSIS — I491 Atrial premature depolarization: Secondary | ICD-10-CM | POA: Diagnosis not present

## 2022-11-04 LAB — CBC
HCT: 39.9 % (ref 36.0–46.0)
Hemoglobin: 13.3 g/dL (ref 12.0–15.0)
MCH: 27.7 pg (ref 26.0–34.0)
MCHC: 33.3 g/dL (ref 30.0–36.0)
MCV: 83.1 fL (ref 80.0–100.0)
Platelets: 292 10*3/uL (ref 150–400)
RBC: 4.8 MIL/uL (ref 3.87–5.11)
RDW: 13 % (ref 11.5–15.5)
WBC: 12.7 10*3/uL — ABNORMAL HIGH (ref 4.0–10.5)
nRBC: 0 % (ref 0.0–0.2)

## 2022-11-04 LAB — BASIC METABOLIC PANEL
Anion gap: 11 (ref 5–15)
BUN: 8 mg/dL (ref 6–20)
CO2: 22 mmol/L (ref 22–32)
Calcium: 9.4 mg/dL (ref 8.9–10.3)
Chloride: 104 mmol/L (ref 98–111)
Creatinine, Ser: 0.86 mg/dL (ref 0.44–1.00)
GFR, Estimated: >60 mL/min (ref >60)
Glucose, Bld: 129 mg/dL — ABNORMAL HIGH (ref 70–99)
Potassium: 3.8 mmol/L (ref 3.5–5.1)
Sodium: 137 mmol/L (ref 135–145)

## 2022-11-04 LAB — I-STAT BETA HCG BLOOD, ED (MC, WL, AP ONLY): I-stat hCG, quantitative: <5 m[IU]/mL (ref <5)

## 2022-11-04 LAB — TROPONIN I (HIGH SENSITIVITY)
Troponin I (High Sensitivity): 4 ng/L (ref <18)
Troponin I (High Sensitivity): 4 ng/L (ref <18)

## 2022-11-04 LAB — D-DIMER, QUANTITATIVE: D-Dimer, Quant: 0.38 ug/mL-FEU (ref 0.00–0.50)

## 2022-11-04 MED ORDER — METHOCARBAMOL 500 MG PO TABS: 500.0000 mg | ORAL_TABLET | Freq: Two times a day (BID) | ORAL | 0 refills | Status: DC

## 2022-11-04 NOTE — ED Provider Notes (Signed)
[place] EMERGENCY DEPARTMENT AT [facility] Provider Note   CSN: [id number] Arrival date & time: 11/04/22  0865     History  Chief Complaint  Patient presents with   Chest Pain    Laurie Bell is a 42 y.o. female with history of GERD, anxiety, arthritis, hyperthyroidism, breast cancer who presents to the ER complaining of chest pain x few hours.  Patient complains of intermittent left arm and leg tingling/burning for the past several days.  States that she has previously had similar symptoms and been placed on gabapentin.  She was told it was related to a muscle in her neck.  This morning the same symptoms woke her up, with associated chest pain.  She described it as feeling "my chest locked up", making it difficult to breathe.  This was around 5 AM.  EMS gave 1 tablet nitroglycerin sublingual, and 324 mg aspirin, did not affect the pain.  They also placed her on oxygen as she felt she was having a hard time breathing.  Denies any cardiac history.  Denies any history of blood clots, no recent long travel.  Intermittently has some swelling in her left knee and left ankle, without calf swelling or tenderness.  She is currently being monitored/worked up for precancerous cells in her breast tissue. Also been having some nasal congestion for a few days, been taking allergy medicine and using nasal sprays.     Chest Pain Associated symptoms: fatigue, numbness and shortness of breath   Associated symptoms: no cough and no fever        Home Medications Prior to Admission medications   Medication Sig Start Date End Date Taking? Authorizing Provider  methocarbamol (ROBAXIN) 500 MG tablet Take 1 tablet (500 mg total) by mouth 2 (two) times daily. 11/04/22  Yes Ewa Hipp T, PA-C  acetaminophen (TYLENOL) 500 MG tablet Take 1,000 mg by mouth every 6 (six) hours as needed for moderate pain.    [provider]  AMBULATORY NON FORMULARY MEDICATION Medication Name: GI  Cocktail-270 ml viscous Xylocaine 2% with 270 ml Dicyclomine 10mg /5 ml and 810 ml Mylanta SIG take 8-10 ml PO every 4-6 hours PRN severe stomach pain 09/14/22   Imogene Burn, MD  ASHWAGANDHA PO Take 2 capsules by mouth 2 (two) times daily as needed (anxiety).    [provider]  cyclobenzaprine (FLEXERIL) 10 MG tablet TAKE 1 TABLET BY MOUTH AT BEDTIME AS NEEDED FOR MUSCLE SPASMS 10/31/22   Kirsteins, Victorino Sparrow, MD  esomeprazole (NEXIUM) 40 MG capsule Take 1 capsule (40 mg total) by mouth in the morning and at bedtime. 05/19/22   Imogene Burn, MD  fluticasone (FLONASE) 50 MCG/ACT nasal spray Place 2 sprays into both nostrils daily. Patient taking differently: Place 2 sprays into both nostrils daily as needed for allergies. 12/30/21   Lurline Idol, FNP  gabapentin (NEURONTIN) 400 MG capsule Take 400 mg by mouth 3 (three) times daily.    [provider]  hydrOXYzine (ATARAX) 25 MG tablet TAKE 1 TABLET(25 MG) BY MOUTH THREE TIMES DAILY 07/04/22   Hoy Register, MD  lidocaine (LIDODERM) 5 % Place 1 patch onto the skin daily. 08/26/21   Hoy Register, MD  linaclotide Karlene Einstein) 145 MCG CAPS capsule Take 1 capsule (145 mcg total) by mouth daily before breakfast. 05/19/22   Imogene Burn, MD  methimazole (TAPAZOLE) 5 MG tablet Take 1 tablet (5 mg total) by mouth as directed. 1 tablet Monday through Friday, skip Saturdays and  Sundays 09/27/22   Shamleffer, Konrad Dolores, MD  naproxen (NAPROSYN) 500 MG tablet TAKE 1 TABLET (500 MG TOTAL) BY MOUTH 2 (TWO) TIMES DAILY AS NEEDED FOR PAIN 09/20/22   Anders Simmonds, PA-C  ondansetron (ZOFRAN-ODT) 4 MG disintegrating tablet Take 1 tablet (4 mg total) by mouth every 8 (eight) hours as needed for nausea or vomiting. 08/18/21   Imogene Burn, MD  Phenylephrine-Acetaminophen (TYLENOL SINUS+HEADACHE PO) Take 2 tablets by mouth daily as needed (allergies).    [provider]  medroxyPROGESTERone (PROVERA) 10 MG tablet Take 2 tablets by  mouth daily Patient not taking: Reported on 06/05/2019 02/06/18 02/02/20  Conan Bowens, MD      Allergies    Latex    Review of Systems   Review of Systems  Constitutional:  Positive for fatigue. Negative for fever.  Respiratory:  Positive for shortness of breath. Negative for cough.   Cardiovascular:  Positive for chest pain.  Neurological:  Positive for numbness.  All other systems reviewed and are negative.   Physical Exam Updated Vital Signs BP 109/78   Pulse 74   Temp 98.4 F (36.9 C) (Oral)   Resp 14   Ht 5\' 6"  (1.676 m)   Wt 89.4 kg   LMP 10/25/2022 (Exact Date)   SpO2 100%   BMI 31.80 kg/m  Physical Exam Vitals and nursing note reviewed.  Constitutional:      Appearance: Normal appearance.  HENT:     Head: Normocephalic and atraumatic.  Eyes:     Conjunctiva/sclera: Conjunctivae normal.  Cardiovascular:     Rate and Rhythm: Normal rate and regular rhythm.     Pulses:          Posterior tibial pulses are 2+ on the right side and 2+ on the left side.  Pulmonary:     Effort: Pulmonary effort is normal. No respiratory distress.     Breath sounds: Normal breath sounds.  Abdominal:     General: There is no distension.     Palpations: Abdomen is soft.     Tenderness: There is no abdominal tenderness.  Musculoskeletal:     Right lower leg: No edema.     Left lower leg: No edema.  Skin:    General: Skin is warm and dry.  Neurological:     General: No focal deficit present.     Mental Status: She is alert.     Comments: 5/5 strength in all extremities.  Reports decreased sensation to the left upper extremity and left foot compared to the right.     ED Results / Procedures / Treatments   Labs (all labs ordered are listed, but only abnormal results are displayed) Labs Reviewed  BASIC METABOLIC PANEL - Abnormal; Notable for the following components:      Result Value   Glucose, Bld 129 (*)    All other components within normal limits  CBC - Abnormal;  Notable for the following components:   WBC 12.7 (*)    All other components within normal limits  D-DIMER, QUANTITATIVE  I-STAT BETA HCG BLOOD, ED ([facility], [facility], [facility] ONLY)  TROPONIN I (HIGH SENSITIVITY)  TROPONIN I (HIGH SENSITIVITY)    EKG EKG Interpretation  Date/Time:  Saturday Nov 04 2022 05:46:08 EDT Ventricular Rate:  82 PR Interval:  171 QRS Duration: 85 QT Interval:  376 QTC Calculation: 440 R Axis:   35 Text Interpretation: Sinus rhythm Multiple ventricular premature complexes Anterior infarct, old Confirmed by Mesner, Barbara Cower [phone]) on  11/04/2022 6:14:11 AM  Radiology DG Chest 2 View  Result Date: 11/04/2022 CLINICAL DATA:  Chest pain EXAM: CHEST - 2 VIEW COMPARISON:  02/04/2022 FINDINGS: The heart size and mediastinal contours are within normal limits. Both lungs are clear. The visualized skeletal structures are unremarkable. IMPRESSION: No active cardiopulmonary disease. Electronically Signed   By: Kennith Center M.D.   On: 11/04/2022 06:46    Procedures Procedures    Medications Ordered in ED Medications - No data to display  ED Course/ Medical Decision Making/ A&P             HEART Score: 1                Medical Decision Making  This patient is a 42 y.o. female  who presents to the ED for concern of chest pain.   Differential diagnoses prior to evaluation: The emergent differential diagnosis includes, but is not limited to,  ACS, pericarditis, myocarditis, aortic dissection, PE, pneumothorax, esophageal spasm or rupture, chronic angina, pneumonia, bronchitis, GERD, reflux/PUD, biliary disease, pancreatitis, costochondritis, anxiety. This is not an exhaustive differential.   Past Medical History / Co-morbidities / Social History: GERD, anxiety, arthritis, hyperthyroidism, breast cancer  Physical Exam: Physical exam performed. The pertinent findings include: Normal vital signs, no acute distress.  Normal respiratory effort, lung sounds clear.  Heart regular rate  and rhythm.  No peripheral edema.  Lab Tests/Imaging studies: I personally interpreted labs/imaging and the pertinent results include:  leukocytosis of 12.7, normal hemoglobin. BMP unremarkable. Negative pregnancy. Initial troponin 4, delta troponin 4. D-dimer negative.  CXR without acute abnormalities. I agree with the radiologist interpretation.  Cardiac monitoring: EKG obtained and interpreted by my attending physician which shows: sinus rhythm with PVCs   Disposition: After consideration of the diagnostic results and the patients response to treatment, I feel that emergency department workup does not suggest an emergent condition requiring admission or immediate intervention beyond what has been performed at this time. Patient is to be discharged with recommendation to follow up with PCP in regards to today's hospital visit. Chest pain is not likely of cardiac or pulmonary etiology d/t presentation, D-dimer for PE negative, VSS, no tracheal deviation, no JVD or new murmur, RRR, breath sounds equal bilaterally, EKG without acute abnormalities, negative troponin, and negative CXR. Heart score of 1. Pt has been advised to return to the ED if CP becomes exertional, associated with diaphoresis or nausea, radiates to left jaw/arm, worsens or becomes concerning in any way. Pt appears reliable for follow up and is agreeable to discharge.   I discussed this case with my attending physician Dr. Jacqulyn Bath who cosigned this note including patient's presenting symptoms, physical exam, and planned diagnostics and interventions. Attending physician stated agreement with plan or made changes to plan which were implemented.    Final Clinical Impression(s) / ED Diagnoses Final diagnoses:  Nonspecific chest pain  Nasal congestion    Rx / DC Orders ED Discharge Orders          Ordered    methocarbamol (ROBAXIN) 500 MG tablet  2 times daily        11/04/22 0910           Portions of this report may have  been transcribed using voice recognition software. Every effort was made to ensure accuracy; however, inadvertent computerized transcription errors may be present.    Jeanella Flattery 11/04/22 0912    Maia Plan, MD 11/04/22 [phone]

## 2022-11-04 NOTE — Discharge Instructions (Signed)
You were seen in the emergency department today for chest pain.  As we discussed your lab work, EKG, chest x-ray all looked reassuring today.  I think that your symptoms could likely been related to your muscles and possibly nerves coming out of your neck.   I recommend monitoring your stress levels.  You can do gentle stretching or get a massage. I am prescribing you a muscle relaxer, and I also recommend taking over the counter anti-inflammatories like ibuprofen or tylenol. Use a heating pad, lidocaine patches, or topical creams/gels.   Continue your allergy medication. You can do daily sinus rinses too.   Continue to monitor how you are doing overall, and return to the emergency department for any new or worsening symptoms such as: Worsening pain or pain with exertion, difficulty breathing, sweating, or pain or swelling in your legs.

## 2022-11-04 NOTE — ED Triage Notes (Signed)
Patient BIB EMS for evaluation of chest pain x 1 hour.  No reports of cardiac hx.  Pt reports chest pain woke her from sleep.  C/o numbness and tingling to L arm.  Given Nitro SL x 1 and ASA 324 mg PO PTA.  Reports no changes in chest pain.

## 2022-11-08 DIAGNOSIS — C50911 Malignant neoplasm of unspecified site of right female breast: Secondary | ICD-10-CM | POA: Diagnosis not present

## 2022-11-09 ENCOUNTER — Encounter: Payer: Self-pay | Admitting: Internal Medicine

## 2022-11-09 ENCOUNTER — Telehealth: Payer: Self-pay | Admitting: Internal Medicine

## 2022-11-09 NOTE — Telephone Encounter (Signed)
Good Morning Dr. Leonides Schanz,  I called this patient at 10:45am today.  Patient stated she needed to have some surgery  done on her breast to remove some tissue possible cancerous in the next day or so.   And the doctor told her to hold off on our procedure.  She called on Friday of last week and left a message regarding cancellation of appointment.

## 2022-11-10 ENCOUNTER — Ambulatory Visit
Admission: RE | Admit: 2022-11-10 | Discharge: 2022-11-10 | Disposition: A | Discharge status: Home / Self Care | Payer: 59 | Source: Ambulatory Visit | Attending: Surgery | Admitting: Surgery

## 2022-11-10 DIAGNOSIS — D0511 Intraductal carcinoma in situ of right breast: Secondary | ICD-10-CM

## 2022-11-10 DIAGNOSIS — Z01818 Encounter for other preprocedural examination: Secondary | ICD-10-CM | POA: Diagnosis not present

## 2022-11-10 HISTORY — PX: BREAST BIOPSY: SHX20

## 2022-11-10 NOTE — Progress Notes (Signed)

## 2022-11-10 NOTE — Progress Notes (Signed)
Patient returned call and states she has had "no more episodes of chest pain or shortness of breath" since her ER admission. Plan to proceed with seed placement and sx as scheduled per Dr Isaias Cowman.

## 2022-11-10 NOTE — Progress Notes (Signed)
Reviewed chart with Dr Isaias Cowman (anesthesia) after seeing notes in chart for recent ED visit for CP/SOB. Pt going for rad seed this afternoon and surgery scheduled for Monday. Dr Isaias Cowman wants to make sure pt hasn't had additional episodes of CP/SOB since ED visit prior to proceeding. Called pt, no answer, LM on VM to return call ASAP.

## 2022-11-11 ENCOUNTER — Other Ambulatory Visit: Payer: Self-pay | Admitting: Physician Assistant

## 2022-11-11 DIAGNOSIS — R519 Headache, unspecified: Secondary | ICD-10-CM

## 2022-11-12 NOTE — H&P (Signed)
REFERRING PHYSICIAN: Sabas Sous, MD PROVIDER: Wayne Both, MD MRN: [id number] DOB: [birth year]  Subjective  Chief Complaint: Breast Cancer  History of Present Illness: Laurie Bell is a 42 y.o. female who is seen  as an office consultation for evaluation of Breast Cancer  This is a 42 year old female was found on recent screening mammography to have calcifications in her right breast. She underwent an ultrasound showing a 1 cm area of calcifications. 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 There was possible microinvasion. It was 30% ER positive and 2% PR positive. She has a lot of anxiety and complaints about multiple issues including reflux. She has had no previous problems regarding her breast and denies nipple discharge. There is a family history of breast cancer in her maternal grandmother. She has no cardiopulmonary issues.  Review of Systems: A complete review of systems was obtained from the patient. I have reviewed this information and discussed as appropriate with the patient. See HPI as well for other ROS.  ROS  Medical History: Past Medical History: Diagnosis Date Anxiety Arthritis GERD (gastroesophageal reflux disease)  Patient Active Problem List Diagnosis MDD (major depressive disorder), recurrent episode, moderate (CMS/HHS-HCC) Nausea and vomiting Subclinical hyperthyroidism Multinodular goiter Hypomagnesemia Hypokalemia Hypocalcemia Irregular heart rhythm Gastroesophageal reflux disease GAD (generalized anxiety disorder) Dysphagia Ductal carcinoma in situ (DCIS) of right breast DUB (dysfunctional uterine bleeding) Cervical high risk HPV (human papillomavirus) test positive Fibroid, uterine Weight loss Ringing in the ears, unspecified laterality Visual disturbance Palpitations Leg swelling Heartburn Constipation Neck pain Dizziness Nervously anxious  Past Surgical History: Procedure Laterality Date endometrial  ablation with novasure N/A 04/25/2017 esophagel manometry N/A 07/27/2021 right breast bx Right 10/02/2022   Allergies Allergen Reactions Latex Other (See Comments) Burn in vaginal area with latex condoms  Current Outpatient Medications on File Prior to Visit Medication Sig Dispense Refill cyclobenzaprine (FLEXERIL) 10 MG tablet Take 10 mg by mouth at bedtime as needed for Muscle spasms esomeprazole (NEXIUM) 40 MG DR capsule Take 40 mg by mouth as directed Take 1 capsule (40 mg total) by mouth in the morning and at bedtime. hydrOXYzine (ATARAX) 25 MG tablet Take 25 mg by mouth 3 (three) times daily linaCLOtide (LINZESS) 145 mcg capsule Take 145 mcg by mouth every morning before breakfast methIMAzole (TAPAZOLE) 5 MG tablet Take 5 mg by mouth as directed (Take 1 tablet (5 mg total) by mouth as directed. 1 tablet Monday through Friday, skip Saturdays and Sundays) naproxen (NAPROSYN) 500 MG tablet Take 500 mg by mouth 2 (two) times daily as needed potassium chloride (KLOR-CON) 10 MEQ ER tablet Take 10 mEq by mouth once daily  No current facility-administered medications on file prior to visit.  Family History Problem Relation Age of Onset High blood pressure (Hypertension) Mother   Social History  Tobacco Use Smoking Status Never Smokeless Tobacco Never   Social History  Socioeconomic History Marital status: Single Tobacco Use Smoking status: Never Smokeless tobacco: Never Vaping Use Vaping status: Never Used Substance and Sexual Activity Alcohol use: Never Drug use: Never  Social Determinants of Health  Financial Resource Strain: Medium Risk (11/03/2021) Received from [facility] Overall Financial Resource Strain (CARDIA) Difficulty of Paying Living Expenses: Somewhat hard Food Insecurity: No Food Insecurity (06/16/2021) Received from [facility] Hunger Vital Sign Worried About Running Out of Food in the Last Year: Never true Ran Out of Food in the Last Year: Never  true Transportation Needs: No Transportation Needs (11/03/2021) Received from [facility] - Transportation Lack  of Transportation (Medical): No Lack of Transportation (Non-Medical): No Physical Activity: Sufficiently Active (11/03/2021) Received from [facility] Exercise Vital Sign Days of Exercise per Week: 3 days Minutes of Exercise per Session: 50 min Stress: Stress Concern Present (11/03/2021) Received from [facility] - Occupational Stress Questionnaire Feeling of Stress : Very much Social Connections: Moderately Isolated (11/03/2021) Received from [facility] Social Connection and Isolation Panel [NHANES] Frequency of Communication with Friends and Family: Once a week Frequency of Social Gatherings with Friends and Family: Once a week Attends Religious Services: More than 4 times per year Active Member of [employer] or Organizations: No Attends Banker Meetings: Never Marital Status: Living with partner  Objective: There were no vitals filed for this visit. There is no height or weight on file to calculate BMI.  Physical Exam  She appears well on exam  There are no palpable breast masses. The nipple areolar complexes are normal. 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 I discussed the diagnosis with the patient and her family and friends. From a surgical standpoint we then discussed breast conservation versus mastectomy. We discussed the long-term results of both. She is interested in breast conservation. I next discussed proceeding with a radioactive seed guided right breast lumpectomy. I  explained the procedure in detail. We discussed the risks which includes but is not limited to bleeding, infection, the need for further surgery if the margins are positive or the need for eventual sentinel lymph node biopsy if there is invasive cancer found at the final pathology. We discussed postoperative and long-term follow-up as well. She understands and wished to proceed with the right breast radioactive seed guided lumpectomy as discussed. She will be scheduled

## 2022-11-13 ENCOUNTER — Ambulatory Visit (HOSPITAL_BASED_OUTPATIENT_CLINIC_OR_DEPARTMENT_OTHER): Payer: 59 | Admitting: Anesthesiology

## 2022-11-13 ENCOUNTER — Encounter (HOSPITAL_BASED_OUTPATIENT_CLINIC_OR_DEPARTMENT_OTHER): Payer: Self-pay | Admitting: Surgery

## 2022-11-13 ENCOUNTER — Other Ambulatory Visit: Payer: Self-pay

## 2022-11-13 ENCOUNTER — Ambulatory Visit
Admission: RE | Admit: 2022-11-13 | Discharge: 2022-11-13 | Disposition: A | Discharge status: Home / Self Care | Payer: 59 | Source: Ambulatory Visit | Attending: Surgery | Admitting: Surgery

## 2022-11-13 ENCOUNTER — Ambulatory Visit (HOSPITAL_BASED_OUTPATIENT_CLINIC_OR_DEPARTMENT_OTHER)
Admission: RE | Admit: 2022-11-13 | Discharge: 2022-11-13 | Disposition: A | Discharge status: Home / Self Care | Payer: 59 | Attending: Surgery | Admitting: Surgery

## 2022-11-13 ENCOUNTER — Encounter (HOSPITAL_BASED_OUTPATIENT_CLINIC_OR_DEPARTMENT_OTHER)
Admission: RE | Disposition: A | Discharge status: Home / Self Care | Payer: Self-pay | Source: Home / Self Care | Attending: Surgery

## 2022-11-13 DIAGNOSIS — F32A Depression, unspecified: Secondary | ICD-10-CM | POA: Insufficient documentation

## 2022-11-13 DIAGNOSIS — M1990 Unspecified osteoarthritis, unspecified site: Secondary | ICD-10-CM | POA: Insufficient documentation

## 2022-11-13 DIAGNOSIS — D0511 Intraductal carcinoma in situ of right breast: Secondary | ICD-10-CM | POA: Diagnosis not present

## 2022-11-13 DIAGNOSIS — C50811 Malignant neoplasm of overlapping sites of right female breast: Secondary | ICD-10-CM | POA: Diagnosis not present

## 2022-11-13 DIAGNOSIS — Z01818 Encounter for other preprocedural examination: Secondary | ICD-10-CM

## 2022-11-13 DIAGNOSIS — C50911 Malignant neoplasm of unspecified site of right female breast: Secondary | ICD-10-CM | POA: Diagnosis not present

## 2022-11-13 DIAGNOSIS — F418 Other specified anxiety disorders: Secondary | ICD-10-CM

## 2022-11-13 DIAGNOSIS — E0590 Thyrotoxicosis, unspecified without thyrotoxic crisis or storm: Secondary | ICD-10-CM

## 2022-11-13 DIAGNOSIS — F419 Anxiety disorder, unspecified: Secondary | ICD-10-CM | POA: Diagnosis not present

## 2022-11-13 DIAGNOSIS — Z48817 Encounter for surgical aftercare following surgery on the skin and subcutaneous tissue: Secondary | ICD-10-CM | POA: Diagnosis not present

## 2022-11-13 DIAGNOSIS — Z803 Family history of malignant neoplasm of breast: Secondary | ICD-10-CM | POA: Diagnosis not present

## 2022-11-13 DIAGNOSIS — Z171 Estrogen receptor negative status [ER-]: Secondary | ICD-10-CM | POA: Diagnosis not present

## 2022-11-13 DIAGNOSIS — K219 Gastro-esophageal reflux disease without esophagitis: Secondary | ICD-10-CM | POA: Diagnosis not present

## 2022-11-13 DIAGNOSIS — Z170 Estrogen receptor positive status [ER+]: Secondary | ICD-10-CM | POA: Diagnosis not present

## 2022-11-13 DIAGNOSIS — Z79899 Other long term (current) drug therapy: Secondary | ICD-10-CM | POA: Insufficient documentation

## 2022-11-13 HISTORY — PX: BREAST LUMPECTOMY WITH RADIOACTIVE SEED LOCALIZATION: SHX6424

## 2022-11-13 LAB — POCT PREGNANCY, URINE: Preg Test, Ur: NEGATIVE

## 2022-11-13 SURGERY — BREAST LUMPECTOMY WITH RADIOACTIVE SEED LOCALIZATION
Anesthesia: General | Site: Breast | Laterality: Right

## 2022-11-13 MED ORDER — FENTANYL CITRATE (PF) 100 MCG/2ML IJ SOLN: 25.0000 ug | INTRAMUSCULAR | Status: DC | PRN

## 2022-11-13 MED ORDER — ENSURE PRE-SURGERY PO LIQD: 296.0000 mL | Freq: Once | ORAL | Status: DC

## 2022-11-13 MED ORDER — LIDOCAINE 2% (20 MG/ML) 5 ML SYRINGE
INTRAMUSCULAR | Status: DC | PRN
  Administered 2022-11-13: 60 mg via INTRAVENOUS

## 2022-11-13 MED ORDER — OXYCODONE HCL 5 MG PO TABS: 5.0000 mg | ORAL_TABLET | Freq: Four times a day (QID) | ORAL | 0 refills | Status: DC | PRN

## 2022-11-13 MED ORDER — LACTATED RINGERS IV SOLN: INTRAVENOUS | Status: DC

## 2022-11-13 MED ORDER — OXYCODONE HCL 5 MG/5ML PO SOLN: 5.0000 mg | Freq: Once | ORAL | Status: AC | PRN

## 2022-11-13 MED ORDER — PROPOFOL 10 MG/ML IV BOLUS
INTRAVENOUS | Status: DC | PRN
  Administered 2022-11-13: 200 mg via INTRAVENOUS

## 2022-11-13 MED ORDER — CEFAZOLIN SODIUM-DEXTROSE 2-4 GM/100ML-% IV SOLN
INTRAVENOUS | Status: AC
  Filled 2022-11-13: qty 100

## 2022-11-13 MED ORDER — ONDANSETRON HCL 4 MG/2ML IJ SOLN
INTRAMUSCULAR | Status: DC | PRN
  Administered 2022-11-13: 4 mg via INTRAVENOUS

## 2022-11-13 MED ORDER — PROPOFOL 500 MG/50ML IV EMUL
INTRAVENOUS | Status: DC | PRN
  Administered 2022-11-13: 150 ug/kg/min via INTRAVENOUS

## 2022-11-13 MED ORDER — CHLORHEXIDINE GLUCONATE CLOTH 2 % EX PADS: 6.0000 | MEDICATED_PAD | Freq: Once | CUTANEOUS | Status: DC

## 2022-11-13 MED ORDER — PROPOFOL 10 MG/ML IV BOLUS
INTRAVENOUS | Status: AC
  Filled 2022-11-13: qty 20

## 2022-11-13 MED ORDER — OXYCODONE HCL 5 MG PO TABS
ORAL_TABLET | ORAL | Status: AC
  Filled 2022-11-13: qty 1

## 2022-11-13 MED ORDER — DEXMEDETOMIDINE HCL IN NACL 80 MCG/20ML IV SOLN
INTRAVENOUS | Status: DC | PRN
  Administered 2022-11-13: 8 ug via INTRAVENOUS

## 2022-11-13 MED ORDER — ACETAMINOPHEN 500 MG PO TABS
1000.0000 mg | ORAL_TABLET | ORAL | Status: AC
  Administered 2022-11-13: 1000 mg via ORAL

## 2022-11-13 MED ORDER — ONDANSETRON HCL 4 MG/2ML IJ SOLN: 4.0000 mg | Freq: Four times a day (QID) | INTRAMUSCULAR | Status: DC | PRN

## 2022-11-13 MED ORDER — OXYCODONE HCL 5 MG PO TABS
5.0000 mg | ORAL_TABLET | Freq: Once | ORAL | Status: AC | PRN
  Administered 2022-11-13: 5 mg via ORAL

## 2022-11-13 MED ORDER — ACETAMINOPHEN 500 MG PO TABS
ORAL_TABLET | ORAL | Status: AC
  Filled 2022-11-13: qty 2

## 2022-11-13 MED ORDER — MIDAZOLAM HCL 2 MG/2ML IJ SOLN
INTRAMUSCULAR | Status: AC
  Filled 2022-11-13: qty 2

## 2022-11-13 MED ORDER — DEXAMETHASONE SODIUM PHOSPHATE 4 MG/ML IJ SOLN
INTRAMUSCULAR | Status: DC | PRN
  Administered 2022-11-13: 5 mg via INTRAVENOUS

## 2022-11-13 MED ORDER — MIDAZOLAM HCL 5 MG/5ML IJ SOLN
INTRAMUSCULAR | Status: DC | PRN
  Administered 2022-11-13: 2 mg via INTRAVENOUS

## 2022-11-13 MED ORDER — FENTANYL CITRATE (PF) 100 MCG/2ML IJ SOLN
INTRAMUSCULAR | Status: DC | PRN
  Administered 2022-11-13 (×3): 50 ug via INTRAVENOUS

## 2022-11-13 MED ORDER — CEFAZOLIN IN SODIUM CHLORIDE 3-0.9 GM/100ML-% IV SOLN
3.0000 g | INTRAVENOUS | Status: AC
  Administered 2022-11-13: 2 g via INTRAVENOUS

## 2022-11-13 MED ORDER — FENTANYL CITRATE (PF) 100 MCG/2ML IJ SOLN
INTRAMUSCULAR | Status: AC
  Filled 2022-11-13: qty 2

## 2022-11-13 MED ORDER — BUPIVACAINE HCL (PF) 0.5 % IJ SOLN
INTRAMUSCULAR | Status: DC | PRN
  Administered 2022-11-13: 20 mL

## 2022-11-13 SURGICAL SUPPLY — 46 items
ADH SKN CLS APL DERMABOND .7 (GAUZE/BANDAGES/DRESSINGS) ×1
APL PRP STRL LF DISP 70% ISPRP (MISCELLANEOUS) ×1
APPLIER CLIP 9.375 MED OPEN (MISCELLANEOUS) ×1
APR CLP MED 9.3 20 MLT OPN (MISCELLANEOUS) ×1
BLADE SURG 15 STRL LF DISP TIS (BLADE) ×2 IMPLANT
BLADE SURG 15 STRL SS (BLADE) ×1
CANISTER SUC SOCK COL 7IN (MISCELLANEOUS) IMPLANT
CANISTER SUCT 1200ML W/VALVE (MISCELLANEOUS) IMPLANT
CHLORAPREP W/TINT 26 (MISCELLANEOUS) ×2 IMPLANT
CLIP APPLIE 9.375 MED OPEN (MISCELLANEOUS) IMPLANT
COVER BACK TABLE 60X90IN (DRAPES) ×2 IMPLANT
COVER MAYO STAND STRL (DRAPES) ×2 IMPLANT
COVER PROBE CYLINDRICAL 5X96 (MISCELLANEOUS) ×2 IMPLANT
DERMABOND ADVANCED .7 DNX12 (GAUZE/BANDAGES/DRESSINGS) ×2 IMPLANT
DRAPE LAPAROSCOPIC ABDOMINAL (DRAPES) ×2 IMPLANT
DRAPE UTILITY XL STRL (DRAPES) ×2 IMPLANT
ELECT REM PT RETURN 9FT ADLT (ELECTROSURGICAL) ×1
ELECTRODE REM PT RTRN 9FT ADLT (ELECTROSURGICAL) ×2 IMPLANT
GAUZE SPONGE 4X4 12PLY STRL LF (GAUZE/BANDAGES/DRESSINGS) IMPLANT
GLOVE BIOGEL PI IND STRL 7.0 (GLOVE) IMPLANT
GLOVE BIOGEL PI MICRO STRL 5.5 (GLOVE) IMPLANT
GLOVE SURG SS PI 6.5 STRL IVOR (GLOVE) IMPLANT
GLOVE SURG SYN 7.5 E (GLOVE) ×1 IMPLANT
GLOVE SURG SYN 7.5 PF PI (GLOVE) IMPLANT
GOWN STRL REUS W/ TWL LRG LVL3 (GOWN DISPOSABLE) ×2 IMPLANT
GOWN STRL REUS W/ TWL XL LVL3 (GOWN DISPOSABLE) ×2 IMPLANT
GOWN STRL REUS W/TWL LRG LVL3 (GOWN DISPOSABLE) ×2
GOWN STRL REUS W/TWL XL LVL3 (GOWN DISPOSABLE) ×1
KIT MARKER MARGIN INK (KITS) ×2 IMPLANT
NDL HYPO 25X1 1.5 SAFETY (NEEDLE) ×2 IMPLANT
NEEDLE HYPO 25X1 1.5 SAFETY (NEEDLE) ×1 IMPLANT
NS IRRIG 1000ML POUR BTL (IV SOLUTION) IMPLANT
PACK BASIN DAY SURGERY FS (CUSTOM PROCEDURE TRAY) ×2 IMPLANT
PENCIL SMOKE EVACUATOR (MISCELLANEOUS) ×2 IMPLANT
SLEEVE SCD COMPRESS KNEE MED (STOCKING) ×2 IMPLANT
SPIKE FLUID TRANSFER (MISCELLANEOUS) IMPLANT
SPONGE T-LAP 4X18 ~~LOC~~+RFID (SPONGE) ×2 IMPLANT
SUT MNCRL AB 4-0 PS2 18 (SUTURE) ×2 IMPLANT
SUT SILK 2 0 SH (SUTURE) IMPLANT
SUT VIC AB 3-0 SH 27 (SUTURE) ×1
SUT VIC AB 3-0 SH 27X BRD (SUTURE) ×2 IMPLANT
SYR CONTROL 10ML LL (SYRINGE) ×2 IMPLANT
TOWEL GREEN STERILE FF (TOWEL DISPOSABLE) ×2 IMPLANT
TRAY FAXITRON CT DISP (TRAY / TRAY PROCEDURE) ×2 IMPLANT
TUBE CONNECTING 20X1/4 (TUBING) IMPLANT
YANKAUER SUCT BULB TIP NO VENT (SUCTIONS) IMPLANT

## 2022-11-13 NOTE — Telephone Encounter (Signed)
Requested Prescriptions  Pending Prescriptions Disp Refills   naproxen (NAPROSYN) 500 MG tablet [Pharmacy Med Name: NAPROXEN 500 MG TABLET] 60 tablet 1    Sig: TAKE 1 TABLET BY MOUTH 2 TIMES DAILY AS NEEDED FOR PAIN.     Analgesics:  NSAIDS Failed - 11/11/2022  7:39 AM      Failed - Manual Review: Labs are only required if the patient has taken medication for more than 8 weeks.      Passed - Cr in normal range and within 360 days    Creatinine  Date Value Ref Range Status  10/11/2022 0.78 0.44 - 1.00 mg/dL Final   Creatinine, Ser  Date Value Ref Range Status  11/04/2022 0.86 0.44 - 1.00 mg/dL Final         Passed - HGB in normal range and within 360 days    Hemoglobin  Date Value Ref Range Status  11/04/2022 13.3 12.0 - 15.0 g/dL Final  16/03/9603 54.0 12.0 - 15.0 g/dL Final         Passed - PLT in normal range and within 360 days    Platelets  Date Value Ref Range Status  11/04/2022 292 150 - 400 K/uL Final   Platelet Count  Date Value Ref Range Status  10/11/2022 286 150 - 400 K/uL Final         Passed - HCT in normal range and within 360 days    HCT  Date Value Ref Range Status  11/04/2022 39.9 36.0 - 46.0 % Final         Passed - eGFR is 30 or above and within 360 days    GFR calc Af Amer  Date Value Ref Range Status  02/01/2020 >60 >60 mL/min Final   GFR, Estimated  Date Value Ref Range Status  11/04/2022 >60 >60 mL/min Final    Comment:    (NOTE) Calculated using the CKD-EPI Creatinine Equation (2021)   10/11/2022 >60 >60 mL/min Final    Comment:    (NOTE) Calculated using the CKD-EPI Creatinine Equation (2021)    eGFR  Date Value Ref Range Status  01/18/2022 103 >59 mL/min/1.73 Final         Passed - Patient is not pregnant      Passed - Valid encounter within last 12 months    Recent Outpatient Visits           3 months ago [facility]   [facility] [place], [place], [place]   7 months ago Hypokalemia   [facility] Marcine Matar, MD   9 months ago Cervical radiculopathy   [facility] Hoy Register, MD   1 year ago Cervical radiculopathy   [facility] Hoy Register, MD   1 year ago Cervical radiculopathy   [facility] Hoy Register, MD

## 2022-11-13 NOTE — Op Note (Signed)
   Laurie Bell 11/13/2022   Pre-op Diagnosis: RIGHT BREAST DCIS     Post-op Diagnosis: same  Procedure(s): RIGHT BREAST LUMPECTOMY WITH RADIOACTIVE SEED LOCALIZATION  Surgeon(s): Abigail Miyamoto, MD Carollee Herter, MD  Anesthesia: General  Staff:  Circulator: Lenn Cal, RN Relief Circulator: Maryan Rued, RN Relief Scrub: Kennith Maes, RN  Estimated Blood Loss: Minimal               Specimens: sent to path  Indications: This is a 42 year old female was found to have calcifications in her right breast on screening mammography.  The area measured about 1 cm.  She underwent a biopsy showing ductal carcinoma in situ.  The decision was made to proceed with a radioactive seed guided right breast lumpectomy  Procedure: The patient was brought to the operating room and identified as a correct patient.  She was placed upon the operating table and general anesthesia was induced.  Her right breast was then prepped and draped in usual sterile fashion.  The radioactive seed was located in the deep tissue with the neoprobe at the 12 o'clock position of the right breast.  I anesthetized the upper edge of the areola with Marcaine and then made a circumareolar incision with a scalpel.  We then dissected down to the breast tissue and then superiorly with electrocautery.  Once we got superior to the signal from the radioactive seed we then dissected down to the chest wall.  The patient had very fibrocystic breasts with a lot of dense tissue.  We stayed around the signal from the radioactive seed going down to near the chest wall with the electrocautery.  We then completed the lumpectomy staying underneath the signal from the radioactive seed.  Once the specimen was removed we then marked all margins with paint.  An x-ray was performed and specimen confirming the radioactive seed and previous tissue marker in the specimen.  The specimen was then sent to pathology for evaluation.   Hemostasis was achieved with the cautery.  We placed surgical clips around the periphery of the lumpectomy cavity.  We then closed the subcutaneous tissue with interrupted 3-0 Vicryl sutures and closed the skin with a running 4-0 Monocryl after anesthetizing the cavity further with Marcaine.  The patient tolerated the procedure well.  All the counts were correct at the end of the procedure.  The patient was then extubated in the operating room and taken in a stable condition to the recovery room.          Abigail Miyamoto   Date: 11/13/2022  Time: 1:01 PM

## 2022-11-13 NOTE — Anesthesia Postprocedure Evaluation (Signed)
Anesthesia Post Note  Patient: [name]  Procedure(s) Performed: RIGHT BREAST LUMPECTOMY WITH RADIOACTIVE SEED LOCALIZATION (Right: Breast)     Patient location during evaluation: PACU Anesthesia Type: General Level of consciousness: awake and alert Pain management: pain level controlled Vital Signs Assessment: post-procedure vital signs reviewed and stable Respiratory status: spontaneous breathing, nonlabored ventilation, respiratory function stable and patient connected to nasal cannula oxygen Cardiovascular status: blood pressure returned to baseline and stable Postop Assessment: no apparent nausea or vomiting Anesthetic complications: no   No notable events documented.  Last Vitals:  Vitals:   11/13/22 1345 11/13/22 1413  BP: 101/83 102/75  Pulse: 76 77  Resp: 11 16  Temp:  36.6 C  SpO2: 99% 98%    Last Pain:  Vitals:   11/13/22 1410  TempSrc:   PainSc: 7                  Veasna Santibanez S

## 2022-11-13 NOTE — Discharge Instructions (Addendum)
[facility] Office Phone Number [phone]  BREAST BIOPSY/ PARTIAL MASTECTOMY: POST OP INSTRUCTIONS  Always review your discharge instruction sheet given to you by the facility where your surgery was performed.  IF YOU HAVE DISABILITY OR FAMILY LEAVE FORMS, YOU MUST BRING THEM TO THE OFFICE FOR PROCESSING.  DO NOT GIVE THEM TO YOUR DOCTOR.  A prescription for pain medication may be given to you upon discharge.  Take your pain medication as prescribed, if needed.  If narcotic pain medicine is not needed, then you may take acetaminophen (Tylenol) or ibuprofen (Advil) as needed. Take your usually prescribed medications unless otherwise directed If you need a refill on your pain medication, please contact your pharmacy.  They will contact our office to request authorization.  Prescriptions will not be filled after 5pm or on week-ends. You should eat very light the first 24 hours after surgery, such as soup, crackers, pudding, etc.  Resume your normal diet the day after surgery. Most patients will experience some swelling and bruising in the breast.  Ice packs and a good support bra will help.  Swelling and bruising can take several days to resolve.  It is common to experience some constipation if taking pain medication after surgery.  Increasing fluid intake and taking a stool softener will usually help or prevent this problem from occurring.  A mild laxative (Milk of Magnesia or Miralax) should be taken according to package directions if there are no bowel movements after 48 hours. Unless discharge instructions indicate otherwise, you may remove your bandages 24-48 hours after surgery, and you may shower at that time.  You may have steri-strips (small skin tapes) in place directly over the incision.  These strips should be left on the skin for 7-10 days.  If your surgeon used skin glue on the incision, you may shower in 24 hours.  The glue will flake off over the next 2-3 weeks.  Any  sutures or staples will be removed at the office during your follow-up visit. ACTIVITIES:  You may resume regular daily activities (gradually increasing) beginning the next day.  Wearing a good support bra or sports bra minimizes pain and swelling.  You may have sexual intercourse when it is comfortable. You may drive when you no longer are taking prescription pain medication, you can comfortably wear a seatbelt, and you can safely maneuver your car and apply brakes. RETURN TO WORK:  ______________________________________________________________________________________ Laurie Bell should see your doctor in the office for a follow-up appointment approximately two weeks after your surgery.  Your doctor's nurse will typically make your follow-up appointment when she calls you with your pathology report.  Expect your pathology report 2-3 business days after your surgery.  You may call to check if you do not hear from [place] after three days. OTHER INSTRUCTIONS: __YOU MAY REMOVE THE BINDER AND SHOWER STARTING TOMORROW THEN PUT THE BINDER BACK ON ICE PACK, IBUPROFEN, AND TYLENOL ALSO FOR PAIN NO VIGOROUS ACTIVITY FOR ONE WEEK _____________________________________________________________________________________________ _____________________________________________________________________________________________________________________________________ _____________________________________________________________________________________________________________________________________ _____________________________________________________________________________________________________________________________________  WHEN TO CALL YOUR DOCTOR: Fever over 101.0 Nausea and/or vomiting. Extreme swelling or bruising. Continued bleeding from incision. Increased pain, redness, or drainage from the incision.  The clinic staff is available to answer your questions during regular business hours.  Please don't hesitate to call  and ask to speak to one of the nurses for clinical concerns.  If you have a medical emergency, go to the nearest emergency room or call 911.  A surgeon from [facility] is always on  call at the hospital.  For further questions, please visit [email or web address]    Post Anesthesia Home Care Instructions  Activity: Get plenty of rest for the remainder of the day. A responsible individual must stay with you for 24 hours following the procedure.  For the next 24 hours, DO NOT: -Drive a car -Advertising copywriter -Drink alcoholic beverages -Take any medication unless instructed by your physician -Make any legal decisions or sign important papers.  Meals: Start with liquid foods such as gelatin or soup. Progress to regular foods as tolerated. Avoid greasy, spicy, heavy foods. If nausea and/or vomiting occur, drink only clear liquids until the nausea and/or vomiting subsides. Call your physician if vomiting continues.  Special Instructions/Symptoms: Your throat may feel dry or sore from the anesthesia or the breathing tube placed in your throat during surgery. If this causes discomfort, gargle with warm salt water. The discomfort should disappear within 24 hours.  If you had a scopolamine patch placed behind your ear for the management of post- operative nausea and/or vomiting:  1. The medication in the patch is effective for 72 hours, after which it should be removed.  Wrap patch in a tissue and discard in the trash. Wash hands thoroughly with soap and water. 2. You may remove the patch earlier than 72 hours if you experience unpleasant side effects which may include dry mouth, dizziness or visual disturbances. 3. Avoid touching the patch. Wash your hands with soap and water after contact with the patch.     May have Tylenol today after 4:45 PM

## 2022-11-13 NOTE — Anesthesia Procedure Notes (Signed)
Procedure Name: LMA Insertion Date/Time: 11/13/2022 12:19 PM  Performed by: Burna Cash, CRNAPre-anesthesia Checklist: Patient identified, Emergency Drugs available, Suction available and Patient being monitored Patient Re-evaluated:Patient Re-evaluated prior to induction Oxygen Delivery Method: Circle system utilized Preoxygenation: Pre-oxygenation with 100% oxygen Induction Type: IV induction Ventilation: Mask ventilation without difficulty LMA: LMA inserted LMA Size: 4.0 Number of attempts: 1 Airway Equipment and Method: Bite block Placement Confirmation: positive ETCO2 Tube secured with: Tape Dental Injury: Teeth and Oropharynx as per pre-operative assessment

## 2022-11-13 NOTE — Interval H&P Note (Signed)
History and Physical Interval Note: no change in H and P  11/13/2022 11:31 AM  Laurie Bell  has presented today for surgery, with the diagnosis of RIGHT BREAST DCIS.  The various methods of treatment have been discussed with the patient and family. After consideration of risks, benefits and other options for treatment, the patient has consented to  Procedure(s): RIGHT BREAST LUMPECTOMY WITH RADIOACTIVE SEED LOCALIZATION (Right) as a surgical intervention.  The patient's history has been reviewed, patient examined, no change in status, stable for surgery.  I have reviewed the patient's chart and labs.  Questions were answered to the patient's satisfaction.     Abigail Miyamoto

## 2022-11-13 NOTE — Anesthesia Preprocedure Evaluation (Signed)
Anesthesia Evaluation  Patient identified by MRN, date of birth, ID band Patient awake    Reviewed: Allergy & Precautions, H&P , NPO status , Patient's Chart, lab work & pertinent test results  Airway Mallampati: II   Neck ROM: full    Dental   Pulmonary neg pulmonary ROS   breath sounds clear to auscultation       Cardiovascular negative cardio ROS  Rhythm:regular Rate:Normal     Neuro/Psych  PSYCHIATRIC DISORDERS Anxiety Depression       GI/Hepatic ,GERD  ,,  Endo/Other   Hyperthyroidism   Renal/GU      Musculoskeletal  (+) Arthritis ,    Abdominal   Peds  Hematology   Anesthesia Other Findings   Reproductive/Obstetrics Breast CA                             Anesthesia Physical Anesthesia Plan  ASA: 2  Anesthesia Plan: General   Post-op Pain Management:    Induction: Intravenous  PONV Risk Score and Plan: 3 and Ondansetron, Dexamethasone, Midazolam and Treatment may vary due to age or medical condition  Airway Management Planned: LMA  Additional Equipment:   Intra-op Plan:   Post-operative Plan: Extubation in OR  Informed Consent: I have reviewed the patients History and Physical, chart, labs and discussed the procedure including the risks, benefits and alternatives for the proposed anesthesia with the patient or authorized representative who has indicated his/her understanding and acceptance.     Dental advisory given  Plan Discussed with: CRNA, Anesthesiologist and Surgeon  Anesthesia Plan Comments:        Anesthesia Quick Evaluation

## 2022-11-13 NOTE — Transfer of Care (Signed)
Immediate Anesthesia Transfer of Care Note  Patient: Laurie Bell  Procedure(s) Performed: RIGHT BREAST LUMPECTOMY WITH RADIOACTIVE SEED LOCALIZATION (Right: Breast)  Patient Location: PACU  Anesthesia Type:General  Level of Consciousness: sedated  Airway & Oxygen Therapy: Patient Spontanous Breathing and Patient connected to face mask oxygen  Post-op Assessment: Report given to RN and Post -op Vital signs reviewed and stable  Post vital signs: Reviewed and stable  Last Vitals:  Vitals Value Taken Time  BP    Temp    Pulse    Resp    SpO2      Last Pain:  Vitals:   11/13/22 1039  TempSrc: Temporal  PainSc: 0-No pain         Complications: No notable events documented.

## 2022-11-14 ENCOUNTER — Encounter (HOSPITAL_BASED_OUTPATIENT_CLINIC_OR_DEPARTMENT_OTHER): Payer: Self-pay | Admitting: Surgery

## 2022-11-14 ENCOUNTER — Other Ambulatory Visit: Payer: Self-pay | Admitting: Surgery

## 2022-11-14 LAB — SURGICAL PATHOLOGY

## 2022-11-15 DIAGNOSIS — C50911 Malignant neoplasm of unspecified site of right female breast: Secondary | ICD-10-CM | POA: Diagnosis not present

## 2022-11-15 NOTE — Progress Notes (Signed)
Patient Care Team: Hoy Register, MD as PCP - General (Family Medicine) Corky Crafts, MD as PCP - Cardiology (Cardiology) Glendale Chard, DO as Consulting Physician (Neurology) Abigail Miyamoto, MD as Consulting Physician (General Surgery) Serena Croissant, MD as Consulting Physician (Hematology and Oncology) Lonie Peak, MD as Attending Physician (Radiation Oncology) Pershing Proud, RN as Oncology Nurse Navigator Donnelly Angelica, RN as Oncology Nurse Navigator  DIAGNOSIS:  Encounter Diagnosis  Name Primary?   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, ER 30% weak, PR 2%   10/11/2022 Cancer Staging   Staging form: Breast, AJCC 8th Edition - Clinical: Stage 0 (cTis (DCIS), cN0, cM0, G3, ER+, PR+, HER2: Not Assessed) - Signed by Serena Croissant, MD on 10/11/2022 Stage prefix: Initial diagnosis Histologic grading system: 3 grade system   10/11/2022 Genetic Testing   Declined hereditary cancer genetic testing    11/13/2022 Surgery   Right lumpectomy: Grade 3 IDC, 2 cm in size inferior and posterior margins are positive, DCIS posterior margin positive, lymphovascular invasion not identified, ER 0%, PR 0%, Ki-67 85%, HER2 0 by IHC     CHIEF COMPLIANT: Follow-up after surgery  INTERVAL HISTORY: ELEXA LATZKE is a 42 y.o. female is here because of prior history of right breast DCIS.  She underwent a lumpectomy which revealed invasive breast cancer that was triple negative.  Unfortunately the margins are positive and therefore she will need to undergo another surgery.  She came to  [place] to discuss the pathology report and to discuss adjuvant treatment plan.   ALLERGIES:  is allergic to latex.  MEDICATIONS:  Current Outpatient Medications  Medication Sig Dispense Refill   acetaminophen (TYLENOL) 500 MG tablet Take 1,000 mg by mouth every 6 (six) hours as needed for moderate pain.     albuterol (VENTOLIN HFA) 108 (90 Base) MCG/ACT inhaler Inhale 2 puffs into the lungs every 6 (six) hours as needed for wheezing or shortness of breath. 16 g 0   AMBULATORY NON FORMULARY MEDICATION Medication Name: GI Cocktail-270 ml viscous Xylocaine 2% with 270 ml Dicyclomine 10mg /5 ml and 810 ml Mylanta SIG take 8-10 ml PO every 4-6 hours PRN severe stomach pain 1350 mL 0   ASHWAGANDHA PO Take 2 capsules by mouth 2 (two) times daily as needed (anxiety).     cyclobenzaprine (FLEXERIL) 10 MG tablet TAKE 1 TABLET BY MOUTH AT BEDTIME AS NEEDED FOR MUSCLE SPASMS 30 tablet 1   esomeprazole (NEXIUM) 40 MG capsule Take 1 capsule (40 mg total) by mouth in the morning and at bedtime. 60 capsule 5   fluticasone (FLONASE) 50 MCG/ACT nasal spray Place 2 sprays into both nostrils daily. (Patient taking differently: Place 2 sprays into both nostrils daily as needed for allergies.) 16 g 0   gabapentin (NEURONTIN) 400 MG capsule Take 400 mg by mouth 3 (three) times daily.     hydrOXYzine (ATARAX) 25 MG tablet TAKE 1 TABLET(25 MG) BY MOUTH THREE TIMES DAILY 270 tablet 0   lidocaine (LIDODERM) 5 % Place 1 patch onto the skin daily. 30 patch 3   linaclotide (LINZESS) 145 MCG CAPS capsule Take 1 capsule (145 mcg total) by  mouth daily before breakfast. 30 capsule 5   methimazole (TAPAZOLE) 5 MG tablet Take 1 tablet (5 mg total) by mouth as directed. 1 tablet Monday through Friday, skip Saturdays and  Sundays 60 tablet 3   methocarbamol (ROBAXIN) 500 MG tablet Take 1 tablet (500 mg total) by mouth 2 (two) times daily. 20 tablet 0   naproxen (NAPROSYN) 500 MG tablet TAKE 1 TABLET BY MOUTH 2 TIMES DAILY AS NEEDED FOR  PAIN. 60 tablet 1   ondansetron (ZOFRAN-ODT) 4 MG disintegrating tablet Take 1 tablet (4 mg total) by mouth every 8 (eight) hours as needed for nausea or vomiting. 20 tablet 0   oxyCODONE (OXY IR/ROXICODONE) 5 MG immediate release tablet Take 1 tablet (5 mg total) by mouth every 6 (six) hours as needed for moderate pain, severe pain or breakthrough pain. 20 tablet 0   Phenylephrine-Acetaminophen (TYLENOL SINUS+HEADACHE PO) Take 2 tablets by mouth daily as needed (allergies).     No current facility-administered medications for this visit.    PHYSICAL EXAMINATION: ECOG PERFORMANCE STATUS: 1 - Symptomatic but completely ambulatory  Vitals:   11/28/22 0948  BP: 95/63  Pulse: 85  Resp: 18  Temp: (!) 97.5 F (36.4 C)  SpO2: 99%   Filed Weights   11/28/22 0948  Weight: 209 lb (94.8 kg)      LABORATORY DATA:  I have reviewed the data as listed    Latest Ref Rng & Units 11/25/2022    9:53 PM 11/04/2022    5:52 AM 10/11/2022   12:49 PM  CMP  Glucose 70 - 99 mg/dL 161  096  045   BUN 6 - 20 mg/dL 9  8  10    Creatinine 0.44 - 1.00 mg/dL 4.09  8.11  9.14   Sodium 135 - 145 mmol/L 136  137  138   Potassium 3.5 - 5.1 mmol/L 3.4  3.8  3.6   Chloride 98 - 111 mmol/L 104  104  104   CO2 22 - 32 mmol/L 24  22  28    Calcium 8.9 - 10.3 mg/dL 8.9  9.4  9.1   Total Protein 6.5 - 8.1 g/dL   7.4   Total Bilirubin 0.3 - 1.2 mg/dL   0.5   Alkaline Phos 38 - 126 U/L   146   AST 15 - 41 U/L   13   ALT 0 - 44 U/L   11     Lab Results  Component Value Date   WBC 11.7 (H) 11/25/2022   HGB 12.8 11/25/2022   HCT 38.4 11/25/2022   MCV 84.6 11/25/2022   PLT 286 11/25/2022   NEUTROABS 4.1 10/11/2022    ASSESSMENT & PLAN:  Malignant neoplasm of upper-outer quadrant of right breast in female, estrogen receptor negative (HCC) Right lumpectomy: Grade 3 IDC, 2 cm in size inferior and posterior margins are positive, DCIS posterior margin positive, lymphovascular invasion not identified, ER 0%, PR 0%,  Ki-67 85%, HER2 0 by IHC  (10/02/2022: Initial biopsy was DCIS ER 30% PR 2%)  Pathology counseling: I discussed the final pathology report of the patient provided  a copy of this report. I discussed the margins as well as lymph node surgeries. We also discussed the final staging along with previously performed ER/PR and HER-2/neu testing.  Recommendation: Resection of the positive margins and sentinel lymph node evaluation Adjuvant chemotherapy with dose dense Adriamycin and Cytoxan followed by Taxol and carboplatin Adjuvant radiation therapy  No role of antiestrogen therapy since the  final pathology was ER/PR negative. Will request for port placement chemo education to start chemotherapy after she heals from the repeat surgery. Patient is obviously very distraught because of the change in pathology from DCIS to triple negative invasive breast cancer requiring chemotherapy.  I will see her back after surgery to review the final pathology No orders of the defined types were placed in this encounter.  The patient has a good understanding of the overall plan. she agrees with it. she will call with any problems that may develop before the next visit here. Total time spent: 45 mins including face to face time and time spent for planning, charting and co-ordination of care   Tamsen Meek, MD 11/28/22    I Janan Ridge am acting as a Neurosurgeon for [employer]  I have reviewed the above documentation for accuracy and completeness, and I agree with the above.

## 2022-11-16 ENCOUNTER — Encounter: Payer: Self-pay | Admitting: *Deleted

## 2022-11-16 DIAGNOSIS — Z171 Estrogen receptor negative status [ER-]: Secondary | ICD-10-CM

## 2022-11-19 NOTE — Therapy (Incomplete)
OUTPATIENT PHYSICAL THERAPY BREAST CANCER BASELINE EVALUATION   Patient Name: Laurie Bell MRN: [id number] DOB:[birth year], 42 y.o., female Today's Date: 11/19/2022  END OF SESSION:   Past Medical History:  Diagnosis Date   Anxiety    Arthritis    Breast cancer (HCC)    Chlamydia contact, treated    GERD (gastroesophageal reflux disease)    Hyperthyroidism    Past Surgical History:  Procedure Laterality Date   BREAST BIOPSY Right 10/02/2022   MM RT BREAST BX W LOC DEV 1ST LESION IMAGE BX SPEC STEREO GUIDE 10/02/2022 [facility]   BREAST BIOPSY  11/10/2022   MM RT RADIOACTIVE SEED LOC MAMMO GUIDE 11/10/2022 [facility]   BREAST LUMPECTOMY WITH RADIOACTIVE SEED LOCALIZATION Right 11/13/2022   Procedure: RIGHT BREAST LUMPECTOMY WITH RADIOACTIVE SEED LOCALIZATION;  Surgeon: Abigail Miyamoto, MD;  Location: [facility];  Service: General;  Laterality: Right;   ENDOMETRIAL ABLATION N/A 04/25/2017   Procedure: ENDOMETRIAL ABLATION With NOVASURE;  Surgeon: Adam Phenix, MD;  Location: [facility];  Service: Gynecology;  Laterality: N/A;   ESOPHAGEAL MANOMETRY N/A 07/27/2021   Procedure: ESOPHAGEAL MANOMETRY (EM);  Surgeon: Imogene Burn, MD;  Location: [facility];  Service: Gastroenterology;  Laterality: N/A;   TUBAL LIGATION  2003   Patient Active Problem List   Diagnosis Date Noted   Malignant neoplasm of upper-inner quadrant of right breast in female, estrogen receptor negative (HCC) 11/16/2022   Ductal carcinoma in situ (DCIS) of right breast 10/09/2022   Multinodular goiter 03/22/2022   Subclinical hyperthyroidism 03/22/2022   MDD (major depressive disorder), recurrent episode, moderate (HCC) 11/03/2021   GAD (generalized anxiety disorder) 11/03/2021   Neck pain 10/28/2021   Cervical high risk HPV (human papillomavirus) test positive 10/17/2021   Irregular heart rhythm 04/15/2021   Numbness on left side 04/15/2021   Dysphagia  03/17/2021   Hypomagnesemia 02/23/2021   Gastroesophageal reflux disease    Hypokalemia 08/24/2020   Atypical chest pain 08/24/2020   Nausea and vomiting 08/24/2020   Hypocalcemia 08/24/2020   DUB (dysfunctional uterine bleeding) 01/12/2017   Fibroid, uterine 01/12/2017    PCP: Hoy Register, MD  REFERRING PROVIDER: ***  REFERRING DIAG: Right Breast Cancer  THERAPY DIAG:  No diagnosis found.  Rationale for Evaluation and Treatment: Rehabilitation  ONSET DATE: 10/04/2022  SUBJECTIVE:                                                                                                                                                                                           SUBJECTIVE STATEMENT: Patient reports she is here today to be seen by her medical  team for her newly diagnosed right breast cancer.   PERTINENT HISTORY:  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 It measures 1 cm and is located in the upper-inner quadrant. It is ER 30%+, PR>1% +, HER 2 unknown and Ki 67 unknown. She had a right lumpectomy performed on 11/13/2022. The biopsy of the lumpectomy showed grade 3 IDC that was triple - with a Ki 67 of 85%. She also had several positive margins and is now scheduled for a Rigth Re-excision with SLNB.      PATIENT GOALS:   reduce lymphedema risk and learn post op HEP.   PAIN:  Are you having pain? {OPRCPAIN:27236}  PRECAUTIONS: Active CA , latex allergy  HAND DOMINANCE: {RIGHT/LEFT:21944}  WEIGHT BEARING RESTRICTIONS: No  FALLS:  Has patient fallen in last 6 months? {fallsyesno:27318}  LIVING ENVIRONMENT: Patient lives with: *** Lives in: {Lives in:[address]70}  OCCUPATION: ***  LEISURE: ***  PRIOR LEVEL OF FUNCTION: Independent   OBJECTIVE:  COGNITION: Overall cognitive status: Within functional limits for tasks assessed    POSTURE:  Forward head and rounded shoulders posture  UPPER EXTREMITY AROM/PROM:  A/PROM RIGHT   eval    Shoulder extension   Shoulder flexion   Shoulder abduction   Shoulder internal rotation   Shoulder external rotation     (Blank rows = not tested)  A/PROM LEFT   eval  Shoulder extension   Shoulder flexion   Shoulder abduction   Shoulder internal rotation   Shoulder external rotation     (Blank rows = not tested)  CERVICAL AROM: All within normal limits:    UPPER EXTREMITY STRENGTH: WFL  LYMPHEDEMA ASSESSMENTS:   LANDMARK RIGHT   eval  10 cm proximal to olecranon process   Olecranon process   10 cm proximal to ulnar styloid process   Just proximal to ulnar styloid process   Across hand at thumb web space   At base of 2nd digit   (Blank rows = not tested)  LANDMARK LEFT   eval  10 cm proximal to olecranon process   Olecranon process   10 cm proximal to ulnar styloid process   Just proximal to ulnar styloid process   Across hand at thumb web space   At base of 2nd digit   (Blank rows = not tested)  L-DEX LYMPHEDEMA SCREENING:  The patient was assessed using the L-Dex machine today to produce a lymphedema index baseline score. The patient will be reassessed on a regular basis (typically every 3 months) to obtain new L-Dex scores. If the score is > 6.5 points away from his/her baseline score indicating onset of subclinical lymphedema, it will be recommended to wear a compression garment for 4 weeks, 12 hours per day and then be reassessed. If the score continues to be > 6.5 points from baseline at reassessment, we will initiate lymphedema treatment. Assessing in this manner has a 95% rate of preventing clinically significant lymphedema.    QUICK DASH SURVEY: ***  PATIENT EDUCATION:  Education details: Lymphedema risk reduction and post op shoulder/posture HEP Person educated: Patient Education method: Explanation, Demonstration, Handout Education comprehension: Patient verbalized understanding and returned demonstration  HOME EXERCISE PROGRAM: Patient was  instructed today in a home exercise program today for post op shoulder range of motion. These included active assist shoulder flexion in sitting, scapular retraction, wall walking with shoulder abduction, and hands behind head external rotation.  She was encouraged to do these twice a day, holding 3 seconds and  repeating 5 times when permitted by her physician.   ASSESSMENT:  CLINICAL IMPRESSION: Pts. multidisciplinary medical team met prior to her assessments to determine a recommended treatment plan.  She is s/p a right lumpectomy for DCIS on 11/13/2022, however biopsy results reveal IDC that is triple negative, as well as having positive margins.She is planning to have a right lumpectomy re-excision with SLNB on 12/01/2022. She will benefit from a post op PT reassessment to determine needs and from L-Dex screens every 3 months for 2 years to detect subclinical lymphedema.  Pt will benefit from skilled therapeutic intervention to improve on the following deficits: Decreased knowledge of precautions, impaired UE functional use, pain, decreased ROM, postural dysfunction.   PT treatment/interventions: ADL/self-care home management, pt/family education, therapeutic exercise  REHAB POTENTIAL: Good  CLINICAL DECISION MAKING: Stable/uncomplicated  EVALUATION COMPLEXITY: Low   GOALS: Goals reviewed with patient? YES  LONG TERM GOALS: (STG=LTG)    Name Target Date Goal status  1 Pt will be able to verbalize understanding of pertinent lymphedema risk reduction practices relevant to her dx specifically related to skin care.  Baseline:  No knowledge 11/19/2022 Achieved at eval  2 Pt will be able to return demo and/or verbalize understanding of the post op HEP related to regaining shoulder ROM. Baseline:  No knowledge 11/19/2022 Achieved at eval  3 Pt will be able to verbalize understanding of the importance of attending the post op After Breast CA Class for further lymphedema risk reduction education and  therapeutic exercise.  Baseline:  No knowledge 11/19/2022 Achieved at eval  4 Pt will demo she has regained full shoulder ROM and function post operatively compared to baselines.  Baseline: See objective measurements taken today. ***     PLAN:  PT FREQUENCY/DURATION: EVAL and 1 follow up appointment.   PLAN FOR NEXT SESSION: will reassess 3-4 weeks post op to determine needs.   Patient will follow up at outpatient cancer rehab 3-4 weeks following surgery.  If the patient requires physical therapy at that time, a specific plan will be dictated and sent to the referring physician for approval. The patient was educated today on appropriate basic range of motion exercises to begin post operatively and the importance of attending the After Breast Cancer class following surgery.  Patient was educated today on lymphedema risk reduction practices as it pertains to recommendations that will benefit the patient immediately following surgery.  She verbalized good understanding.    Physical Therapy Information for After Breast Cancer Surgery/Treatment:  Lymphedema is a swelling condition that you may be at risk for in your arm if you have lymph nodes removed from the armpit area.  After a sentinel node biopsy, the risk is approximately 5-9% and is higher after an axillary node dissection.  There is treatment available for this condition and it is not life-threatening.  Contact your physician or physical therapist with concerns. You may begin the 4 shoulder/posture exercises (see additional sheet) when permitted by your physician (typically a week after surgery).  If you have drains, you may need to wait until those are removed before beginning range of motion exercises.  A general recommendation is to not lift your arms above shoulder height until drains are removed.  These exercises should be done to your tolerance and gently.  This is not a "no pain/no gain" type of recovery so listen to your body and stretch  into the range of motion that you can tolerate, stopping if you have pain.  If you are  having immediate reconstruction, ask your plastic surgeon about doing exercises as he or she may want you to wait. We encourage you to attend the free one time ABC (After Breast Cancer) class offered by [facility].  You will learn information related to lymphedema risk, prevention and treatment and additional exercises to regain mobility following surgery.  You can call [phone] for more information.  This is offered the 1st and 3rd Monday of each month.  You only attend the class one time. While undergoing any medical procedure or treatment, try to avoid blood pressure being taken or needle sticks from occurring on the arm on the side of cancer.   This recommendation begins after surgery and continues for the rest of your life.  This may help reduce your risk of getting lymphedema (swelling in your arm). An excellent resource for those seeking information on lymphedema is the National Lymphedema Network's web site. It can be accessed at www.lymphnet.org If you notice swelling in your hand, arm or breast at any time following surgery (even if it is many years from now), please contact your doctor or physical therapist to discuss this.  Lymphedema can be treated at any time but it is easier for you if it is treated early on.  If you feel like your shoulder motion is not returning to normal in a reasonable amount of time, please contact your surgeon or physical therapist.  [facility] [phone]. [address], [address], [place] [address]  ABC CLASS After Breast Cancer Class  After Breast Cancer Class is a specially designed exercise class to assist you in a safe recover after having breast cancer surgery.  In this class you will learn how to get back to full function whether your drains were just removed or if you had surgery a month ago.  This  one-time class is held the 1st and 3rd Monday of every month from 11:00 a.m. until 12:00 noon virtually.  This class is FREE and space is limited. For more information or to register for the next available class, call [phone].  Class Goals  Understand specific stretches to improve the flexibility of you chest and shoulder. Learn ways to safely strengthen your upper body and improve your posture. Understand the warning signs of infection and why you may be at risk for an arm infection. Learn about Lymphedema and prevention.  ** You do not attend this class until after surgery.  Drains must be removed to participate  Patient was instructed today in a home exercise program today for post op shoulder range of motion. These included active assist shoulder flexion in sitting, scapular retraction, wall walking with shoulder abduction, and hands behind head external rotation.  She was encouraged to do these twice a day, holding 3 seconds and repeating 5 times when permitted by her physician.    Waynette Buttery, PT 11/19/2022, 6:36 PM

## 2022-11-20 ENCOUNTER — Encounter: Payer: Self-pay | Admitting: *Deleted

## 2022-11-20 ENCOUNTER — Other Ambulatory Visit: Payer: Self-pay

## 2022-11-20 ENCOUNTER — Other Ambulatory Visit: Payer: 59

## 2022-11-20 ENCOUNTER — Ambulatory Visit: Payer: 59 | Attending: Hematology and Oncology

## 2022-11-20 DIAGNOSIS — Z171 Estrogen receptor negative status [ER-]: Secondary | ICD-10-CM | POA: Insufficient documentation

## 2022-11-20 DIAGNOSIS — R293 Abnormal posture: Secondary | ICD-10-CM | POA: Diagnosis not present

## 2022-11-20 DIAGNOSIS — C50211 Malignant neoplasm of upper-inner quadrant of right female breast: Secondary | ICD-10-CM | POA: Insufficient documentation

## 2022-11-20 DIAGNOSIS — C50911 Malignant neoplasm of unspecified site of right female breast: Secondary | ICD-10-CM | POA: Insufficient documentation

## 2022-11-20 NOTE — Addendum Note (Signed)
Addended by: Waynette Buttery on: 11/20/2022 11:53 AM   Modules accepted: Orders

## 2022-11-21 ENCOUNTER — Other Ambulatory Visit: Payer: 59

## 2022-11-24 ENCOUNTER — Encounter: Payer: Self-pay | Admitting: *Deleted

## 2022-11-25 ENCOUNTER — Other Ambulatory Visit: Payer: Self-pay

## 2022-11-25 ENCOUNTER — Encounter (HOSPITAL_COMMUNITY): Payer: Self-pay

## 2022-11-25 ENCOUNTER — Emergency Department (HOSPITAL_COMMUNITY)
Admission: EM | Admit: 2022-11-25 | Discharge: 2022-11-26 | Disposition: A | Discharge status: Home / Self Care | Payer: 59 | Attending: Emergency Medicine | Admitting: Emergency Medicine

## 2022-11-25 ENCOUNTER — Emergency Department (HOSPITAL_COMMUNITY): Payer: 59

## 2022-11-25 DIAGNOSIS — Z79899 Other long term (current) drug therapy: Secondary | ICD-10-CM | POA: Insufficient documentation

## 2022-11-25 DIAGNOSIS — Z853 Personal history of malignant neoplasm of breast: Secondary | ICD-10-CM | POA: Diagnosis not present

## 2022-11-25 DIAGNOSIS — R059 Cough, unspecified: Secondary | ICD-10-CM | POA: Diagnosis not present

## 2022-11-25 DIAGNOSIS — E876 Hypokalemia: Secondary | ICD-10-CM | POA: Diagnosis not present

## 2022-11-25 DIAGNOSIS — Z91040 Latex allergy status: Secondary | ICD-10-CM | POA: Diagnosis not present

## 2022-11-25 DIAGNOSIS — R0789 Other chest pain: Secondary | ICD-10-CM

## 2022-11-25 DIAGNOSIS — E039 Hypothyroidism, unspecified: Secondary | ICD-10-CM | POA: Insufficient documentation

## 2022-11-25 DIAGNOSIS — D72829 Elevated white blood cell count, unspecified: Secondary | ICD-10-CM | POA: Insufficient documentation

## 2022-11-25 DIAGNOSIS — J069 Acute upper respiratory infection, unspecified: Secondary | ICD-10-CM | POA: Diagnosis not present

## 2022-11-25 DIAGNOSIS — R079 Chest pain, unspecified: Secondary | ICD-10-CM | POA: Diagnosis not present

## 2022-11-25 LAB — CBC
HCT: 38.4 % (ref 36.0–46.0)
Hemoglobin: 12.8 g/dL (ref 12.0–15.0)
MCH: 28.2 pg (ref 26.0–34.0)
MCHC: 33.3 g/dL (ref 30.0–36.0)
MCV: 84.6 fL (ref 80.0–100.0)
Platelets: 286 10*3/uL (ref 150–400)
RBC: 4.54 MIL/uL (ref 3.87–5.11)
RDW: 13.1 % (ref 11.5–15.5)
WBC: 11.7 10*3/uL — ABNORMAL HIGH (ref 4.0–10.5)
nRBC: 0 % (ref 0.0–0.2)

## 2022-11-25 LAB — BASIC METABOLIC PANEL
Anion gap: 8 (ref 5–15)
BUN: 9 mg/dL (ref 6–20)
CO2: 24 mmol/L (ref 22–32)
Calcium: 8.9 mg/dL (ref 8.9–10.3)
Chloride: 104 mmol/L (ref 98–111)
Creatinine, Ser: 0.94 mg/dL (ref 0.44–1.00)
GFR, Estimated: >60 mL/min (ref >60)
Glucose, Bld: 103 mg/dL — ABNORMAL HIGH (ref 70–99)
Potassium: 3.4 mmol/L — ABNORMAL LOW (ref 3.5–5.1)
Sodium: 136 mmol/L (ref 135–145)

## 2022-11-25 LAB — I-STAT BETA HCG BLOOD, ED (MC, WL, AP ONLY): I-stat hCG, quantitative: <5 m[IU]/mL (ref <5)

## 2022-11-25 LAB — TROPONIN I (HIGH SENSITIVITY): Troponin I (High Sensitivity): <2 ng/L (ref <18)

## 2022-11-25 LAB — D-DIMER, QUANTITATIVE: D-Dimer, Quant: 0.48 ug/mL-FEU (ref 0.00–0.50)

## 2022-11-25 MED ORDER — SODIUM CHLORIDE 0.9 % IV BOLUS
500.0000 mL | Freq: Once | INTRAVENOUS | Status: AC
  Administered 2022-11-25: 500 mL via INTRAVENOUS

## 2022-11-25 MED ORDER — DEXAMETHASONE SODIUM PHOSPHATE 10 MG/ML IJ SOLN
10.0000 mg | Freq: Once | INTRAMUSCULAR | Status: AC
  Administered 2022-11-25: 10 mg via INTRAVENOUS
  Filled 2022-11-25: qty 1

## 2022-11-25 MED ORDER — POTASSIUM CHLORIDE CRYS ER 20 MEQ PO TBCR
40.0000 meq | EXTENDED_RELEASE_TABLET | Freq: Once | ORAL | Status: AC
  Administered 2022-11-26: 40 meq via ORAL
  Filled 2022-11-25: qty 2

## 2022-11-25 NOTE — ED Triage Notes (Addendum)
Pt reports with [facility] and chest pain x 2 days. Pt reports having a dry cough and it feels like something is in her throat. Pt reports having surgery on her breast 2 weeks ago.

## 2022-11-25 NOTE — Discharge Instructions (Addendum)
You have been seen today for your complaint of chest pain, shortness of breath. Your lab work was reassuring and showed no abnormalities. Your imaging was reassuring and showed no abnormalities. You should use Claritin and Flonase for your congestion and cough. Follow up with: Your primary care provider in 1 week for reevaluation of your symptoms Please seek immediate medical care if you develop any of the following symptoms: Your chest pain gets worse. You have a cough that gets worse, or you cough up blood. You have severe pain in your abdomen. You faint. You have sudden, unexplained chest discomfort. You have sudden, unexplained discomfort in your arms, back, neck, or jaw. You have shortness of breath at any time. You suddenly start to sweat, or your skin gets clammy. You feel nausea or you vomit. You suddenly feel lightheaded or dizzy. You have severe weakness, or unexplained weakness or fatigue. Your heart begins to beat quickly, or it feels like it is skipping beats. At this time there does not appear to be the presence of an emergent medical condition, however there is always the potential for conditions to change. Please read and follow the below instructions.  Do not take your medicine if  develop an itchy rash, swelling in your mouth or lips, or difficulty breathing; call 911 and seek immediate emergency medical attention if this occurs.  You may review your lab tests and imaging results in their entirety on your MyChart account.  Please discuss all results of fully with your primary care provider and other specialist at your follow-up visit.  Note: Portions of this text may have been transcribed using voice recognition software. Every effort was made to ensure accuracy; however, inadvertent computerized transcription errors may still be present.

## 2022-11-25 NOTE — ED Provider Notes (Signed)
[facility] AT [facility] Provider Note   CSN: [id number] Arrival date & time: 11/25/22  2135     History  Chief Complaint  Patient presents with   Chest Pain   Shortness of Breath    Laurie Bell is a 42 y.o. female.  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  Symptoms began 2 days ago and have progressively gotten worse.  She localizes the chest pain to the substernal region without radiation.  Describes this as a burning and a pressure.  It is not exertional.  Denies any associated nausea, vomiting or diaphoresis.  She denies any fevers.  She denies any cardiac history.  She did not undergo any chemotherapy for her breast cancer.  Her cough is described as nonproductive.  She denies any unilateral leg swelling, history of DVT or PE, hemoptysis.   Chest Pain Associated symptoms: cough and shortness of breath   Shortness of Breath Associated symptoms: chest pain and cough        Home Medications Prior to Admission medications   Medication Sig Start Date End Date Taking? Authorizing Provider  acetaminophen (TYLENOL) 500 MG tablet Take 1,000 mg by mouth every 6 (six) hours as needed for moderate pain.    [provider]  AMBULATORY NON FORMULARY MEDICATION Medication Name: GI Cocktail-270 ml viscous Xylocaine 2% with 270 ml Dicyclomine 10mg /5 ml and 810 ml Mylanta SIG take 8-10 ml PO every 4-6 hours PRN severe stomach pain 09/14/22   Imogene Burn, MD  ASHWAGANDHA PO Take 2 capsules by mouth 2 (two) times daily as needed (anxiety).    [provider]  cyclobenzaprine (FLEXERIL) 10 MG tablet TAKE 1 TABLET BY MOUTH AT BEDTIME AS NEEDED FOR MUSCLE SPASMS 10/31/22   Kirsteins, Victorino Sparrow, MD  esomeprazole (NEXIUM) 40 MG capsule Take 1 capsule (40 mg total) by mouth in the  morning and at bedtime. 05/19/22   Imogene Burn, MD  fluticasone (FLONASE) 50 MCG/ACT nasal spray Place 2 sprays into both nostrils daily. Patient taking differently: Place 2 sprays into both nostrils daily as needed for allergies. 12/30/21   Lurline Idol, FNP  gabapentin (NEURONTIN) 400 MG capsule Take 400 mg by mouth 3 (three) times daily.    [provider]  hydrOXYzine (ATARAX) 25 MG tablet TAKE 1 TABLET(25 MG) BY MOUTH THREE TIMES DAILY 07/04/22   Hoy Register, MD  lidocaine (LIDODERM) 5 % Place 1 patch onto the skin daily. 08/26/21   Hoy Register, MD  linaclotide Karlene Einstein) 145 MCG CAPS capsule Take 1 capsule (145 mcg total) by mouth daily before breakfast. 05/19/22   Imogene Burn, MD  methimazole (TAPAZOLE) 5 MG tablet Take 1 tablet (5 mg total) by mouth as directed. 1 tablet Monday through Friday, skip Saturdays and  Sundays 09/27/22   Shamleffer, Konrad Dolores, MD  methocarbamol (ROBAXIN) 500 MG tablet Take 1 tablet (500 mg total) by mouth 2 (two) times daily. 11/04/22   Roemhildt, Lorin T, PA-C  naproxen (NAPROSYN) 500 MG tablet TAKE 1 TABLET BY MOUTH 2 TIMES DAILY AS NEEDED FOR PAIN. 11/13/22   Hoy Register, MD  ondansetron (ZOFRAN-ODT) 4 MG disintegrating tablet Take 1 tablet (4 mg total) by mouth every 8 (eight) hours as needed for nausea or vomiting. 08/18/21   Imogene Burn, MD  oxyCODONE (OXY IR/ROXICODONE) 5 MG  immediate release tablet Take 1 tablet (5 mg total) by mouth every 6 (six) hours as needed for moderate pain, severe pain or breakthrough pain. 11/13/22   Abigail Miyamoto, MD  Phenylephrine-Acetaminophen (TYLENOL SINUS+HEADACHE PO) Take 2 tablets by mouth daily as needed (allergies).    [provider]  medroxyPROGESTERone (PROVERA) 10 MG tablet Take 2 tablets by mouth daily Patient not taking: Reported on 06/05/2019 02/06/18 02/02/20  Conan Bowens, MD      Allergies    Latex    Review of Systems   Review of Systems  Respiratory:  Positive for  cough and shortness of breath.   Cardiovascular:  Positive for chest pain.  All other systems reviewed and are negative.   Physical Exam Updated Vital Signs BP (!) 125/99 (BP Location: Left Arm)   Pulse 83   Temp 98.2 F (36.8 C) (Oral)   Resp 18   Ht 5\' 6"  (1.676 m)   Wt 90.7 kg   SpO2 98%   BMI 32.28 kg/m  Physical Exam Vitals and nursing note reviewed.  Constitutional:      General: She is not in acute distress.    Appearance: She is well-developed.     Comments: Resting comfortably in bed  HENT:     Head: Normocephalic and atraumatic.     Comments: Uvula midline.  Tonsils 0 bilaterally.  No posterior pharyngeal erythema.  Mild amount of postnasal drip Eyes:     Conjunctiva/sclera: Conjunctivae normal.  Cardiovascular:     Rate and Rhythm: Normal rate and regular rhythm.     Heart sounds: No murmur heard. Pulmonary:     Effort: Pulmonary effort is normal. No respiratory distress.     Breath sounds: Normal breath sounds. No decreased breath sounds, wheezing, rhonchi or rales.  Chest:     Comments: TTP to the costochondral margin on the left approximately fifth and sixth ribs.  No rashes. Abdominal:     Palpations: Abdomen is soft.     Tenderness: There is no abdominal tenderness.  Musculoskeletal:        General: No swelling.     Cervical back: Neck supple.     Right lower leg: No edema.     Left lower leg: No edema.  Skin:    General: Skin is warm and dry.     Capillary Refill: Capillary refill takes less than 2 seconds.  Neurological:     General: No focal deficit present.     Mental Status: She is alert and oriented to person, place, and time.  Psychiatric:        Mood and Affect: Mood normal.     ED Results / Procedures / Treatments   Labs (all labs ordered are listed, but only abnormal results are displayed) Labs Reviewed  BASIC METABOLIC PANEL - Abnormal; Notable for the following components:      Result Value   Potassium 3.4 (*)    Glucose, Bld  103 (*)    All other components within normal limits  CBC - Abnormal; Notable for the following components:   WBC 11.7 (*)    All other components within normal limits  D-DIMER, QUANTITATIVE  I-STAT BETA HCG BLOOD, ED ([facility], [facility], [facility] ONLY)  TROPONIN I (HIGH SENSITIVITY)  TROPONIN I (HIGH SENSITIVITY)    EKG EKG Interpretation  Date/Time:  Saturday November 25 2022 21:53:04 EDT Ventricular Rate:  91 PR Interval:  175 QRS Duration: 95 QT Interval:  364 QTC Calculation: 448 R Axis:  60 Text Interpretation: Sinus rhythm Confirmed by Virgina Norfolk [phone]) on 11/25/2022 10:15:23 PM  Radiology DG Chest 2 View  Result Date: 11/25/2022 CLINICAL DATA:  Chest pain. EXAM: CHEST - 2 VIEW COMPARISON:  Chest radiograph dated 11/04/2022. FINDINGS: No focal consolidation, pleural effusion, or pneumothorax. The cardiac silhouette is within normal limits. No acute osseous pathology. IMPRESSION: No active cardiopulmonary disease. Electronically Signed   By: Elgie Collard M.D.   On: 11/25/2022 22:08    Procedures Procedures    Medications Ordered in ED Medications  potassium chloride SA (KLOR-CON M) CR tablet 40 mEq (has no administration in time range)  dexamethasone (DECADRON) injection 10 mg (10 mg Intravenous Given 11/25/22 2335)  sodium chloride 0.9 % bolus 500 mL (500 mLs Intravenous New Bag/Given 11/25/22 2335)    ED Course/ Medical Decision Making/ A&P             HEART Score: 1                Medical Decision Making Amount and/or Complexity of Data Reviewed Labs: ordered. Radiology: ordered.  Risk Prescription drug management.  This patient presents to the ED for concern of chest pain, shortness of breath, this involves an extensive number of treatment options, and is a complaint that carries with it a high risk of complications and morbidity.  The emergent differential diagnosis of chest pain includes: Acute coronary syndrome, pericarditis, aortic dissection, pulmonary embolism,  tension pneumothorax, and esophageal rupture.   other urgent/non-acute considerations include, but are not limited to: chronic angina, aortic stenosis, cardiomyopathy, myocarditis, mitral valve prolapse, pulmonary hypertension, hypertrophic obstructive cardiomyopathy (HOCM), aortic insufficiency, right ventricular hypertrophy, pneumonia, pleuritis, bronchitis, pneumothorax, tumor, gastroesophageal reflux disease (GERD), esophageal spasm, Mallory-Weiss syndrome, peptic ulcer disease, biliary disease, pancreatitis, functional gastrointestinal pain, cervical or thoracic disk disease or arthritis, shoulder arthritis, costochondritis, subacromial bursitis, anxiety or panic attack, herpes zoster, breast disorders, chest wall tumors, thoracic outlet syndrome, mediastinitis.   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 ED visit for similar on 11/04/2022  I ordered, reviewed and interpreted labs which include: BMP, CBC, troponin, hCG, D-dimer.  Leukocytosis of 11.7.  Hypokalemia of 3.4.  Labs otherwise normal  I ordered imaging studies including chest x-ray I independently visualized and interpreted imaging which showed normal I agree with the radiologist interpretation  Cardiac Monitoring:  The patient was maintained on a cardiac monitor.  I personally viewed and interpreted the cardiac monitored which showed an underlying rhythm of: NSR  Afebrile, hemodynamically stable.  42 year old female presenting to the ED for evaluation of chest pain and shortness of breath.  States this began 2 days ago and has progressively gotten worse.  EKG is without ischemic changes.  Chest x-ray normal.  Initial troponin normal.  She has point tenderness to the left fifth and sixth ribs. heart score of  1 and I have very low suspicion for ACS as the cause of her symptoms.  She did have a recent surgery and thus a D-dimer was ordered to rule out a PE.  This was negative and I have very low suspicion for PE as a cause of her symptoms.  Patient likely has costochondritis given her URI type symptoms.  She was given a dose of Decadron in the ED for this.  She was given information regarding symptomatic  treatment at home.  She was encouraged to follow-up with her primary care provider in 1 week for reevaluation.  She was given return precautions.  Stable at discharge.  At this time there does not appear to be any evidence of an acute emergency medical condition and the patient appears stable for discharge with appropriate outpatient follow up. Diagnosis was discussed with patient who verbalizes understanding of care plan and is agreeable to discharge. I have discussed return precautions with patient and husband who verbalizes understanding. Patient encouraged to follow-up with their PCP within 1 week. All questions answered.  Patient's case discussed with Dr. Lockie Mola who agrees with plan to discharge with follow-up.   Note: Portions of this report may have been transcribed using voice recognition software. Every effort was made to ensure accuracy; however, inadvertent computerized transcription errors may still be present.        Final Clinical Impression(s) / ED Diagnoses Final diagnoses:  Atypical chest pain  Upper respiratory tract infection, unspecified type    Rx / DC Orders ED Discharge Orders     None         Michelle Piper, Cordelia Poche 11/25/22 2339    Virgina Norfolk, DO 11/25/22 2341

## 2022-11-26 MED ORDER — ALUM & MAG HYDROXIDE-SIMETH 200-200-20 MG/5ML PO SUSP
15.0000 mL | Freq: Once | ORAL | Status: AC
  Administered 2022-11-26: 15 mL via ORAL
  Filled 2022-11-26: qty 30

## 2022-11-27 ENCOUNTER — Encounter: Payer: Self-pay | Admitting: Internal Medicine

## 2022-11-27 ENCOUNTER — Encounter: Payer: Self-pay | Admitting: Hematology and Oncology

## 2022-11-27 ENCOUNTER — Other Ambulatory Visit: Payer: Self-pay | Admitting: *Deleted

## 2022-11-27 ENCOUNTER — Telehealth: Payer: Self-pay | Admitting: Licensed Clinical Social Worker

## 2022-11-27 ENCOUNTER — Other Ambulatory Visit: Payer: Self-pay | Admitting: Family Medicine

## 2022-11-27 ENCOUNTER — Encounter (HOSPITAL_BASED_OUTPATIENT_CLINIC_OR_DEPARTMENT_OTHER): Payer: Self-pay | Admitting: Obstetrics & Gynecology

## 2022-11-27 ENCOUNTER — Telehealth: Payer: Self-pay | Admitting: *Deleted

## 2022-11-27 ENCOUNTER — Encounter: Payer: Self-pay | Admitting: Family Medicine

## 2022-11-27 MED ORDER — ALBUTEROL SULFATE HFA 108 (90 BASE) MCG/ACT IN AERS: 2.0000 | INHALATION_SPRAY | Freq: Four times a day (QID) | RESPIRATORY_TRACT | 0 refills | Status: DC | PRN

## 2022-11-27 NOTE — Telephone Encounter (Signed)
[facility] Clinical Social Work  CSW received VM from pt asking about [employer] application that she reports sending in as well as questions about gas cards.  CSW attempted to return call. No answer. Left VM informing pt that [employer] will communicate directly with her and provided their e-mail contact. Regarding gas cards, informed that she will have to apply through financial navigators for the [employer] once she is in radiation treatment.  Left direct number for pt to call back with any questions.   Jedadiah Abdallah E Ethyn Schetter, LCSW

## 2022-11-27 NOTE — Telephone Encounter (Signed)
Called pt and discussed reasoning for denial of MRI. Informed currently working with pre-auth team to get approved. Received verbal understanding. Discussed as updates are given to me, I will update her. Pt appreciative.

## 2022-11-27 NOTE — Telephone Encounter (Signed)
Pt has been notified verbally of Dr.Shamleffer's message

## 2022-11-28 ENCOUNTER — Other Ambulatory Visit: Payer: Self-pay

## 2022-11-28 ENCOUNTER — Telehealth: Payer: Self-pay

## 2022-11-28 ENCOUNTER — Inpatient Hospital Stay: Payer: 59 | Attending: Hematology and Oncology | Admitting: Hematology and Oncology

## 2022-11-28 VITALS — BP 95/63 | HR 85 | Temp 97.5°F | Resp 18 | Ht 66.0 in | Wt 209.0 lb

## 2022-11-28 DIAGNOSIS — C50411 Malignant neoplasm of upper-outer quadrant of right female breast: Secondary | ICD-10-CM | POA: Insufficient documentation

## 2022-11-28 DIAGNOSIS — Z171 Estrogen receptor negative status [ER-]: Secondary | ICD-10-CM | POA: Insufficient documentation

## 2022-11-28 NOTE — Assessment & Plan Note (Signed)
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 I discussed the margins as well as lymph node surgeries. We also discussed the final staging along with previously performed ER/PR and HER-2/neu testing.  Recommendation: Resection of the positive margins and sentinel lymph node evaluation Adjuvant chemotherapy with dose dense Adriamycin and Cytoxan followed by Taxol and carboplatin Adjuvant radiation therapy  No role of antiestrogen therapy since the final pathology was ER/PR negative. Will request for port placement chemo education to start chemotherapy after she heals from the repeat surgery.

## 2022-11-28 NOTE — Transitions of Care (Post Inpatient/ED Visit) (Signed)
11/28/2022  Name: Laurie Bell MRN: [id number] DOB: 1980/06/15  Today's TOC FU Call Status: Today's TOC FU Call Status:: Successful TOC FU Call Competed TOC FU Call Complete Date: 11/28/22  Red on EMMI-ED Discharge Alert Date & Reason:11/27/22 "Scheduled follow-up appt? No"  Transition Care Management Follow-up Telephone Call Date of Discharge: 11/26/22 Discharge Facility: [facility]) Type of Discharge: Emergency Department Reason for ED Visit: Other: ("atypical chest pain") How have you been since you were released from the hospital?: Better (Pt voices she is "feeling a ltitle better-still having occasional prod cough-clear in color." Denies any SOB.) Any questions or concerns?: No  Items Reviewed: Did you receive and understand the discharge instructions provided?: Yes Medications obtained,verified, and reconciled?: Partial Review Completed (pt states she has not resumed a lot of meds yet since recent breast surgery) Reason for Partial Mediation Review: brief call with pt as she was not at home Any new allergies since your discharge?: No Dietary orders reviewed?: NA Do you have support at home?: Yes People in Home: significant other  Medications Reviewed Today: Medications Reviewed Today     Reviewed by Charlyn Minerva, RN (Registered Nurse) on 11/28/22 at 1342  Med List Status: <None>   Medication Order Taking? Sig Documenting Provider Last Dose Status Informant  acetaminophen (TYLENOL) 500 MG tablet [id number] Yes Take 1,000 mg by mouth every 6 (six) hours as needed for moderate pain. [provider] Taking Active Self  albuterol (VENTOLIN HFA) 108 (90 Base) MCG/ACT inhaler [id number] Yes Inhale 2 puffs into the lungs every 6 (six) hours as needed for wheezing or shortness of breath. Hoy Register, MD Taking Active   Kipp Laurence MEDICATION [id number]  Medication Name: GI Cocktail-270 ml viscous Xylocaine 2% with 270 ml Dicyclomine  10mg /5 ml and 810 ml Mylanta SIG take 8-10 ml PO every 4-6 hours PRN severe stomach pain Imogene Burn, MD  Active   ASHWAGANDHA PO [id number]  Take 2 capsules by mouth 2 (two) times daily as needed (anxiety). [provider]  Active Self  cyclobenzaprine (FLEXERIL) 10 MG tablet [id number]  TAKE 1 TABLET BY MOUTH AT BEDTIME AS NEEDED FOR MUSCLE SPASMS Kirsteins, Victorino Sparrow, MD  Active   esomeprazole (NEXIUM) 40 MG capsule [id number] Yes Take 1 capsule (40 mg total) by mouth in the morning and at bedtime. Imogene Burn, MD Taking Active   fluticasone [facility]) 50 MCG/ACT nasal spray [id number] Yes Place 2 sprays into both nostrils daily.  Patient taking differently: Place 2 sprays into both nostrils daily as needed for allergies.   Lurline Idol, FNP Taking Active Self  gabapentin (NEURONTIN) 400 MG capsule [id number]  Take 400 mg by mouth 3 (three) times daily. [provider]  Active   hydrOXYzine (ATARAX) 25 MG tablet [id number]  TAKE 1 TABLET(25 MG) BY MOUTH THREE TIMES DAILY Newlin, Enobong, MD  Active   lidocaine (LIDODERM) 5 % [id number]  Place 1 patch onto the skin daily. Hoy Register, MD  Active Self  linaclotide Karlene Einstein) 145 MCG CAPS capsule [id number]  Take 1 capsule (145 mcg total) by mouth daily before breakfast. Imogene Burn, MD  Active    Patient not taking:   Discontinued 02/02/20 1436 methimazole (TAPAZOLE) 5 MG tablet [id number]  Take 1 tablet (5 mg total) by mouth as directed. 1 tablet Monday through Friday, skip Saturdays and  Sundays Shamleffer, Konrad Dolores, MD  Active   methocarbamol (ROBAXIN) 500 MG tablet [id number]  Take 1 tablet (500 mg total) by  mouth 2 (two) times daily. Roemhildt, Lorin T, PA-C  Active   naproxen (NAPROSYN) 500 MG tablet [id number]  TAKE 1 TABLET BY MOUTH 2 TIMES DAILY AS NEEDED FOR PAIN. Hoy Register, MD  Active   ondansetron (ZOFRAN-ODT) 4 MG disintegrating tablet [id number]  Take 1 tablet (4 mg total) by mouth every 8 (eight) hours  as needed for nausea or vomiting. Imogene Burn, MD  Active Self, Pharmacy Records  oxyCODONE (OXY IR/ROXICODONE) 5 MG immediate release tablet [id number] Yes Take 1 tablet (5 mg total) by mouth every 6 (six) hours as needed for moderate pain, severe pain or breakthrough pain. Abigail Miyamoto, MD Taking Active   Phenylephrine-Acetaminophen (TYLENOL SINUS+HEADACHE PO) [id number]  Take 2 tablets by mouth daily as needed (allergies). [provider]  Active Self            Home Care and Equipment/Supplies: Were Home Health Services Ordered?: NA Any new equipment or medical supplies ordered?: NA  Functional Questionnaire: Do you need assistance with bathing/showering or dressing?: No Do you need assistance with meal preparation?: No Do you need assistance with eating?: No Do you have difficulty maintaining continence: No Do you need assistance with getting out of bed/getting out of a chair/moving?: No Do you have difficulty managing or taking your medications?: No  Follow up appointments reviewed: PCP Follow-up appointment confirmed?: Yes Date of PCP follow-up appointment?: 11/29/22 Follow-up Provider: Dr. Alvis Lemmings Specialist [facility] Follow-up appointment confirmed?: Yes Date of Specialist follow-up appointment?: 11/28/22 Follow-Up Specialty Provider:: Dr. Pamelia Hoit Do you need transportation to your follow-up appointment?: No Do you understand care options if your condition(s) worsen?: Yes-patient verbalized understanding  SDOH Interventions Today    Flowsheet Row Most Recent Value  SDOH Interventions   Transportation Interventions Intervention Not Indicated      TOC Interventions Today    Flowsheet Row Most Recent Value  TOC Interventions   TOC Interventions Discussed/Reviewed TOC Interventions Discussed, Arranged PCP follow up within 7 days/Care Guide scheduled      Interventions Today    Flowsheet Row Most Recent Value  General Interventions   General  Interventions Discussed/Reviewed General Interventions Discussed, Doctor Visits  Doctor Visits Discussed/Reviewed Doctor Visits Discussed, PCP, Specialist  PCP/Specialist Visits Compliance with follow-up visit  Education Interventions   Education Provided Provided Education  Provided Verbal Education On Nutrition, Medication, When to see the doctor, Other  [resp sx mgmt]  Nutrition Interventions   Nutrition Discussed/Reviewed Nutrition Discussed  Pharmacy Interventions   Pharmacy Dicussed/Reviewed Pharmacy Topics Discussed, Medications and their functions       Alessandra Grout [facility] Care Management Care Management Community Coordinator Direct Phone: [phone] Toll Free: [phone] Fax: [phone]

## 2022-11-29 ENCOUNTER — Encounter: Payer: Self-pay | Admitting: Family Medicine

## 2022-11-29 ENCOUNTER — Encounter (HOSPITAL_COMMUNITY): Payer: Self-pay | Admitting: Surgery

## 2022-11-29 ENCOUNTER — Ambulatory Visit: Payer: 59 | Attending: Family Medicine | Admitting: Family Medicine

## 2022-11-29 ENCOUNTER — Other Ambulatory Visit: Payer: Self-pay

## 2022-11-29 VITALS — BP 111/74 | HR 86 | Temp 98.0°F | Ht 66.0 in | Wt 207.6 lb

## 2022-11-29 DIAGNOSIS — J0100 Acute maxillary sinusitis, unspecified: Secondary | ICD-10-CM | POA: Diagnosis not present

## 2022-11-29 DIAGNOSIS — F32A Depression, unspecified: Secondary | ICD-10-CM | POA: Diagnosis not present

## 2022-11-29 DIAGNOSIS — C50411 Malignant neoplasm of upper-outer quadrant of right female breast: Secondary | ICD-10-CM

## 2022-11-29 DIAGNOSIS — E0590 Thyrotoxicosis, unspecified without thyrotoxic crisis or storm: Secondary | ICD-10-CM

## 2022-11-29 DIAGNOSIS — Z171 Estrogen receptor negative status [ER-]: Secondary | ICD-10-CM

## 2022-11-29 DIAGNOSIS — R002 Palpitations: Secondary | ICD-10-CM

## 2022-11-29 DIAGNOSIS — F419 Anxiety disorder, unspecified: Secondary | ICD-10-CM | POA: Diagnosis not present

## 2022-11-29 MED ORDER — DULOXETINE HCL 60 MG PO CPEP
60.0000 mg | ORAL_CAPSULE | Freq: Every day | ORAL | 1 refills | Status: DC
Start: 2022-11-29 — End: 2023-02-17

## 2022-11-29 MED ORDER — PREDNISONE 20 MG PO TABS
20.0000 mg | ORAL_TABLET | Freq: Every day | ORAL | 0 refills | Status: DC
Start: 2022-11-29 — End: 2022-12-26

## 2022-11-29 MED ORDER — HYDROXYZINE HCL 25 MG PO TABS: ORAL_TABLET | ORAL | 1 refills | Status: DC

## 2022-11-29 MED ORDER — METOPROLOL SUCCINATE ER 25 MG PO TB24
12.5000 mg | ORAL_TABLET | Freq: Every day | ORAL | 1 refills | Status: DC
Start: 2022-11-29 — End: 2023-08-20

## 2022-11-29 NOTE — Progress Notes (Signed)
Neck pain

## 2022-11-29 NOTE — Patient Instructions (Signed)

## 2022-11-29 NOTE — Progress Notes (Signed)
Subjective:  Patient ID: Laurie Bell, female    DOB: February 27, 1981  Age: 42 y.o. MRN: [id number]  CC: Neck Pain   HPI Laurie Bell is a 42 y.o. 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   Interval History:  She had a visit with the cancer center yesterday and notes reviewed.  She is being scheduled for resection of positive margins and sentinel lymph node evaluation, adjuvant chemotherapy and adjuvant radiation. She complains of anxiety especially with her new diagnosis of breast cancer.  Also feels that her heart races a lot.  2 years ago she was seen by cardiology and cardiac symptoms thought to be secondary to anxiety and her chest pain atypical.  She Complains of neck pain on both sides of her neck which is intermittent and it radiates up to her throat. Also states her throat feels irritated. She gets out of breath talking. She feels like a burning in her throat when she swallows and tries to breathe. Seen at the ED 4 days ago for atypical chest pain and URI.  She received a steroid shot and was advised to use antihistamines and Flonase but these have been ineffective.  She has no fever.  Hypothyroidism is managed by endocrinology and she is scheduled for fine-needle aspiration biopsy of her thyroid nodule. Past Medical History:  Diagnosis Date   Anxiety    Arthritis    Breast cancer (HCC)    Chlamydia contact, treated    GERD (gastroesophageal reflux disease)    Hyperthyroidism     Past Surgical History:  Procedure Laterality Date   BREAST BIOPSY Right 10/02/2022   MM RT BREAST BX W LOC DEV 1ST LESION IMAGE BX SPEC STEREO GUIDE 10/02/2022 [facility]   BREAST BIOPSY  11/10/2022   MM RT RADIOACTIVE SEED LOC MAMMO GUIDE 11/10/2022 [facility]   BREAST LUMPECTOMY WITH RADIOACTIVE SEED LOCALIZATION Right 11/13/2022   Procedure: RIGHT BREAST LUMPECTOMY WITH RADIOACTIVE SEED LOCALIZATION;  Surgeon: Abigail Miyamoto, MD;  Location: [facility];  Service: General;  Laterality: Right;   ENDOMETRIAL ABLATION N/A 04/25/2017   Procedure: ENDOMETRIAL ABLATION With NOVASURE;  Surgeon: Adam Phenix, MD;  Location: [facility];  Service: Gynecology;  Laterality: N/A;   ESOPHAGEAL MANOMETRY N/A 07/27/2021   Procedure: ESOPHAGEAL MANOMETRY (EM);  Surgeon: Imogene Burn, MD;  Location: [facility];  Service: Gastroenterology;  Laterality: N/A;   TUBAL LIGATION  2003    Family History  Problem Relation Age of Onset   Hypertension Mother    Hypertension Maternal Aunt    Breast cancer Maternal Grandmother        dx > 50   Stomach cancer Maternal Grandfather        dx > 50   Breast cancer Paternal Grandmother        dx > 50   Esophageal cancer Neg Hx    Rectal cancer Neg Hx    Colon cancer Neg Hx     Social History   Socioeconomic History   Marital status: Single    Spouse name: Not on file   Number of children: 2   Years of education: Not on file   Highest education level: Not on file  Occupational History   Occupation: Event organiser: [employer]  Tobacco Use  Smoking status: Never   Smokeless tobacco: Never  Vaping Use   Vaping Use: Never used  Substance and Sexual Activity   Alcohol use: No   Drug use: Not Currently    Types: Marijuana    Comment: quit 2018   Sexual activity: Not Currently    Birth control/protection: Surgical  Other Topics Concern   Not on file  Social History Narrative   ** Merged History Encounter **       Right Handed  Lives in a one story home    Social Determinants of Health   Financial Resource Strain: Medium Risk (11/03/2021)   Overall Financial Resource Strain (CARDIA)    Difficulty of Paying Living Expenses: Somewhat hard  Food  Insecurity: No Food Insecurity (06/16/2021)   Hunger Vital Sign    Worried About Running Out of Food in the Last Year: Never true    Ran Out of Food in the Last Year: Never true  Transportation Needs: No Transportation Needs (11/28/2022)   PRAPARE - Administrator, Civil Service (Medical): No    Lack of Transportation (Non-Medical): No  Physical Activity: Sufficiently Active (11/03/2021)   Exercise Vital Sign    Days of Exercise per Week: 3 days    Minutes of Exercise per Session: 50 min  Stress: Stress Concern Present (11/03/2021)   [employer] of Occupational Health - Occupational Stress Questionnaire    Feeling of Stress : Very much  Social Connections: Moderately Isolated (11/03/2021)   Social Connection and Isolation Panel [NHANES]    Frequency of Communication with Friends and Family: Once a week    Frequency of Social Gatherings with Friends and Family: Once a week    Attends Religious Services: More than 4 times per year    Active Member of [employer] or Organizations: No    Attends Banker Meetings: Never    Marital Status: Living with partner    Allergies  Allergen Reactions   Latex Other (See Comments)    Burn in vaginal area with latex condoms    Outpatient Medications Prior to Visit  Medication Sig Dispense Refill   acetaminophen (TYLENOL) 500 MG tablet Take 1,000 mg by mouth every 6 (six) hours as needed for moderate pain.     albuterol (VENTOLIN HFA) 108 (90 Base) MCG/ACT inhaler Inhale 2 puffs into the lungs every 6 (six) hours as needed for wheezing or shortness of breath. 16 g 0   AMBULATORY NON FORMULARY MEDICATION Medication Name: GI Cocktail-270 ml viscous Xylocaine 2% with 270 ml Dicyclomine 10mg /5 ml and 810 ml Mylanta SIG take 8-10 ml PO every 4-6 hours PRN severe stomach pain 1350 mL 0   esomeprazole (NEXIUM) 40 MG capsule Take 1 capsule (40 mg total) by mouth in the morning and at bedtime. 60 capsule 5   fluticasone (FLONASE) 50  MCG/ACT nasal spray Place 2 sprays into both nostrils daily. (Patient taking differently: Place 2 sprays into both nostrils daily as needed for allergies.) 16 g 0   gabapentin (NEURONTIN) 400 MG capsule Take 400 mg by mouth 3 (three) times daily.     lidocaine (LIDODERM) 5 % Place 1 patch onto the skin daily. 30 patch 3   linaclotide (LINZESS) 145 MCG CAPS capsule Take 1 capsule (145 mcg total) by mouth daily before breakfast. 30 capsule 5   methimazole (TAPAZOLE) 5 MG tablet Take 1 tablet (5 mg total) by mouth as directed. 1 tablet Monday through Friday, skip Saturdays and  Sundays 60 tablet  3   methocarbamol (ROBAXIN) 500 MG tablet Take 1 tablet (500 mg total) by mouth 2 (two) times daily. 20 tablet 0   naproxen (NAPROSYN) 500 MG tablet TAKE 1 TABLET BY MOUTH 2 TIMES DAILY AS NEEDED FOR PAIN. 60 tablet 1   ondansetron (ZOFRAN-ODT) 4 MG disintegrating tablet Take 1 tablet (4 mg total) by mouth every 8 (eight) hours as needed for nausea or vomiting. 20 tablet 0   oxyCODONE (OXY IR/ROXICODONE) 5 MG immediate release tablet Take 1 tablet (5 mg total) by mouth every 6 (six) hours as needed for moderate pain, severe pain or breakthrough pain. 20 tablet 0   Phenylephrine-Acetaminophen (TYLENOL SINUS+HEADACHE PO) Take 2 tablets by mouth daily as needed (allergies).     hydrOXYzine (ATARAX) 25 MG tablet TAKE 1 TABLET(25 MG) BY MOUTH THREE TIMES DAILY 270 tablet 0   ASHWAGANDHA PO Take 2 capsules by mouth 2 (two) times daily as needed (anxiety). (Patient not taking: Reported on 11/29/2022)     cyclobenzaprine (FLEXERIL) 10 MG tablet TAKE 1 TABLET BY MOUTH AT BEDTIME AS NEEDED FOR MUSCLE SPASMS (Patient not taking: Reported on 11/29/2022) 30 tablet 1   No facility-administered medications prior to visit.     ROS Review of Systems  Constitutional:  Negative for activity change and appetite change.  HENT:  Negative for sinus pressure and sore throat.   Respiratory:  Negative for chest tightness, shortness  of breath and wheezing.   Cardiovascular:  Negative for chest pain and palpitations.  Gastrointestinal:  Negative for abdominal distention, abdominal pain and constipation.  Genitourinary: Negative.   Musculoskeletal:  Positive for neck pain.  Psychiatric/Behavioral:  Negative for behavioral problems and dysphoric mood.     Objective:  BP 111/74   Pulse 86   Temp 98 F (36.7 C) (Oral)   Ht 5\' 6"  (1.676 m)   Wt 207 lb 9.6 oz (94.2 kg)   LMP 11/28/2022 (Exact Date)   SpO2 98%   BMI 33.51 kg/m      11/29/2022    3:08 PM 11/28/2022    9:48 AM 11/26/2022   12:11 AM  BP/Weight  Systolic BP 111 95   Diastolic BP 74 63   Wt. (Lbs) 207.6 209 212  BMI 33.51 kg/m2 33.73 kg/m2 34.22 kg/m2      Physical Exam Constitutional:      Appearance: She is well-developed.  HENT:     Head:     Comments: Right maxillary sinus TTP     Right Ear: Tympanic membrane normal.     Left Ear: Tympanic membrane normal.     Mouth/Throat:     Mouth: Mucous membranes are moist.  Cardiovascular:     Rate and Rhythm: Normal rate.     Heart sounds: Normal heart sounds. No murmur heard. Pulmonary:     Effort: Pulmonary effort is normal.     Breath sounds: Normal breath sounds. No wheezing or rales.  Chest:     Chest wall: No tenderness.  Abdominal:     General: Bowel sounds are normal. There is no distension.     Palpations: Abdomen is soft. There is no mass.     Tenderness: There is no abdominal tenderness.  Musculoskeletal:        General: Normal range of motion.     Cervical back: Normal range of motion. Tenderness (slight TTP of R side of neck) present.     Right lower leg: No edema.     Left lower leg: No edema.  Lymphadenopathy:     Cervical: No cervical adenopathy.  Neurological:     Mental Status: She is alert and oriented to person, place, and time.  Psychiatric:        Mood and Affect: Mood normal.       Latest Ref Rng & Units 11/25/2022    9:53 PM 11/04/2022    5:52 AM 10/11/2022    12:49 PM  CMP  Glucose 70 - 99 mg/dL 409  811  914   BUN 6 - 20 mg/dL 9  8  10    Creatinine 0.44 - 1.00 mg/dL 7.82  9.56  2.13   Sodium 135 - 145 mmol/L 136  137  138   Potassium 3.5 - 5.1 mmol/L 3.4  3.8  3.6   Chloride 98 - 111 mmol/L 104  104  104   CO2 22 - 32 mmol/L 24  22  28    Calcium 8.9 - 10.3 mg/dL 8.9  9.4  9.1   Total Protein 6.5 - 8.1 g/dL   7.4   Total Bilirubin 0.3 - 1.2 mg/dL   0.5   Alkaline Phos 38 - 126 U/L   146   AST 15 - 41 U/L   13   ALT 0 - 44 U/L   11     Lipid Panel  No results found for: "CHOL", "TRIG", "HDL", "CHOLHDL", "VLDL", "LDLCALC", "LDLDIRECT"  CBC    Component Value Date/Time   WBC 11.7 (H) 11/25/2022 2153   RBC 4.54 11/25/2022 2153   HGB 12.8 11/25/2022 2153   HGB 13.0 10/11/2022 1249   HCT 38.4 11/25/2022 2153   PLT 286 11/25/2022 2153   PLT 286 10/11/2022 1249   MCV 84.6 11/25/2022 2153   MCH 28.2 11/25/2022 2153   MCHC 33.3 11/25/2022 2153   RDW 13.1 11/25/2022 2153   LYMPHSABS 3.1 10/11/2022 1249   MONOABS 0.6 10/11/2022 1249   EOSABS 0.1 10/11/2022 1249   BASOSABS 0.0 10/11/2022 1249    Lab Results  Component Value Date   HGBA1C 5.6 01/18/2022    Assessment & Plan:  1. Anxiety and depression Controlled Recent diagnosis of breast cancer also contributing Will add on Cymbalta - hydrOXYzine (ATARAX) 25 MG tablet; TAKE 1 TABLET(25 MG) BY MOUTH THREE TIMES DAILY  Dispense: 270 tablet; Refill: 1 - DULoxetine (CYMBALTA) 60 MG capsule; Take 1 capsule (60 mg total) by mouth daily.  Dispense: 90 capsule; Refill: 1  2. Malignant neoplasm of upper-outer quadrant of right breast in female, estrogen receptor negative (HCC) Status post right breast lumpectomy Scheduled for resection of positive margins and sentinel lymph node evaluation  3. Acute maxillary sinusitis, recurrence not specified Symptoms Advised to continue with Flonase and antihistamine Antibiotic not indicated - predniSONE (DELTASONE) 20 MG tablet; Take 1 tablet  (20 mg total) by mouth daily with breakfast.  Dispense: 5 tablet; Refill: 0  4. Palpitations Heart rate is now within normal range Anxiety could be playing a role Will place on low-dose metoprolol especially given history of hypothyroidism Advised on adverse effects of metoprolol including hypotension and she is to discontinue this if she is hypotensive - metoprolol succinate (TOPROL-XL) 25 MG 24 hr tablet; Take 0.5 tablets (12.5 mg total) by mouth daily. For increased heart rate  Dispense: 45 tablet; Refill: 1  5.  Subclinical hyperthyroidism She remains on methimazole per endocrine   Meds ordered this encounter  Medications   hydrOXYzine (ATARAX) 25 MG tablet    Sig: TAKE 1 TABLET(25 MG) BY MOUTH THREE  TIMES DAILY    Dispense:  270 tablet    Refill:  1   predniSONE (DELTASONE) 20 MG tablet    Sig: Take 1 tablet (20 mg total) by mouth daily with breakfast.    Dispense:  5 tablet    Refill:  0   metoprolol succinate (TOPROL-XL) 25 MG 24 hr tablet    Sig: Take 0.5 tablets (12.5 mg total) by mouth daily. For increased heart rate    Dispense:  45 tablet    Refill:  1   DULoxetine (CYMBALTA) 60 MG capsule    Sig: Take 1 capsule (60 mg total) by mouth daily.    Dispense:  90 capsule    Refill:  1    Follow-up: Return in about 6 months (around 05/31/2023) for Chronic medical conditions.       Hoy Register, MD, FAAFP. [facility] [place], [place] [phone]   11/29/2022, 5:37 PM

## 2022-11-29 NOTE — Progress Notes (Signed)
SDW call  Patient was given pre-op instructions over the phone. Patient verbalized understanding of instructions provided.    PCP -  Dr. Hoy Register Cardiologist - Dr. Lance Muss Pulmonary:    PPM/ICD - denies  Chest x-ray - 11/25/2022 EKG -  11/27/2022 Stress Test - ECHO - 08/26/2020 Cardiac Cath -   Sleep Study/sleep apnea/CPAP: denies  Non-diabetic   Blood Thinner Instructions: denies Aspirin Instructions: denies   ERAS Protcol - yes, clear fluids until 0630 PRE-SURGERY Ensure or G2-    COVID TEST- n/a    Anesthesia review: Yes.  Seen in the ED 11/25/2022 for chest pain and SOB   Patient denies shortness of breath, fever, cough and chest pain over the phone call  Your procedure is scheduled on Friday December 01, 2022  Report to [facility] Main Entrance "A" at 0700 A.M., then check in with the Admitting office.  Call this number if you have problems the morning of surgery:  [phone]   If you have any questions prior to your surgery date call [phone]: Open Monday-Friday 8am-4pm If you experience any cold or flu symptoms such as cough, fever, chills, shortness of breath, etc. between now and your scheduled surgery, please notify us at the above number    Remember:  Do not eat after midnight the night before your surgery  You may drink clear liquids until 0630  the morning of your surgery.   Clear liquids allowed are: Water, Non-Citrus Juices (without pulp), Carbonated Beverages, Clear Tea, Black Coffee ONLY (NO MILK, CREAM OR POWDERED CREAMER of any kind), and Gatorade   Take these medicines the morning of surgery with A SIP OF WATER:  Nexium, gabapentin, hydroxyzine, tapazole, robaxin  As needed: Tylenol, albuterol, flonase, zofran, oxycodone  As of today, STOP taking any Aspirin (unless otherwise instructed by your surgeon) Aleve, Naproxen, Ibuprofen, Motrin, Advil, Goody's, BC's, all herbal medications, fish oil, and all vitamins.

## 2022-11-30 ENCOUNTER — Other Ambulatory Visit: Payer: Self-pay | Admitting: Surgery

## 2022-11-30 ENCOUNTER — Ambulatory Visit
Admission: RE | Admit: 2022-11-30 | Discharge: 2022-11-30 | Disposition: A | Discharge status: Home / Self Care | Payer: 59 | Source: Ambulatory Visit | Attending: Hematology and Oncology | Admitting: Hematology and Oncology

## 2022-11-30 ENCOUNTER — Other Ambulatory Visit: Payer: 59

## 2022-11-30 ENCOUNTER — Encounter: Payer: Self-pay | Admitting: *Deleted

## 2022-11-30 DIAGNOSIS — C50211 Malignant neoplasm of upper-inner quadrant of right female breast: Secondary | ICD-10-CM

## 2022-11-30 DIAGNOSIS — D0511 Intraductal carcinoma in situ of right breast: Secondary | ICD-10-CM | POA: Diagnosis not present

## 2022-11-30 MED ORDER — GADOPICLENOL 0.5 MMOL/ML IV SOLN
10.0000 mL | Freq: Once | INTRAVENOUS | Status: AC | PRN
  Administered 2022-11-30: 10 mL via INTRAVENOUS

## 2022-11-30 NOTE — Anesthesia Preprocedure Evaluation (Addendum)
Anesthesia Evaluation  Patient identified by MRN, date of birth, ID band Patient awake    Reviewed: Allergy & Precautions, H&P , NPO status , Patient's Chart, lab work & pertinent test results  Airway Mallampati: II   Neck ROM: full    Dental   Pulmonary neg pulmonary ROS   breath sounds clear to auscultation       Cardiovascular negative cardio ROS  Rhythm:regular Rate:Normal     Neuro/Psych  PSYCHIATRIC DISORDERS Anxiety Depression       GI/Hepatic ,GERD  ,,  Endo/Other   Hyperthyroidism   Renal/GU      Musculoskeletal  (+) Arthritis ,    Abdominal   Peds  Hematology   Anesthesia Other Findings   Reproductive/Obstetrics Breast CA                             Anesthesia Physical Anesthesia Plan  ASA: 2  Anesthesia Plan: General   Post-op Pain Management: Regional block*, Tylenol PO (pre-op)* and Toradol IV (intra-op)*   Induction: Intravenous  PONV Risk Score and Plan: 3 and Ondansetron, Dexamethasone, Midazolam and Treatment may vary due to age or medical condition  Airway Management Planned: LMA  Additional Equipment:   Intra-op Plan:   Post-operative Plan: Extubation in OR  Informed Consent: I have reviewed the patients History and Physical, chart, labs and discussed the procedure including the risks, benefits and alternatives for the proposed anesthesia with the patient or authorized representative who has indicated his/her understanding and acceptance.     Dental advisory given  Plan Discussed with: CRNA, Anesthesiologist and Surgeon  Anesthesia Plan Comments: (  )       Anesthesia Quick Evaluation

## 2022-11-30 NOTE — H&P (Signed)
Laurie Bell is an 42 y.o. female.   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  The final pathology however showed a 2 cm invasive triple negative breast cancer.  Margins were positive.  She was seen again by medical oncology and decision been made to proceed with reexcision of the margins, sentinel node biopsy, and Port-A-Cath insertion for IV chemotherapy.  She has had an MRI which showed only enhancement around the biopsy cavity but no other abnormalities in either breast and no enlarged lymph nodes.  Past Medical History:  Diagnosis Date   Anxiety    Arthritis    Breast cancer (HCC)    Chlamydia contact, treated    GERD (gastroesophageal reflux disease)    Hyperthyroidism     Past Surgical History:  Procedure Laterality Date   BREAST BIOPSY Right 10/02/2022   MM RT BREAST BX W LOC DEV 1ST LESION IMAGE BX SPEC STEREO GUIDE 10/02/2022 [facility]   BREAST BIOPSY  11/10/2022   MM RT RADIOACTIVE SEED LOC MAMMO GUIDE 11/10/2022 [facility]   BREAST LUMPECTOMY WITH RADIOACTIVE SEED LOCALIZATION Right 11/13/2022   Procedure: RIGHT BREAST LUMPECTOMY WITH RADIOACTIVE SEED LOCALIZATION;  Surgeon: Abigail Miyamoto, MD;  Location: [facility];  Service: General;  Laterality: Right;   ENDOMETRIAL ABLATION N/A 04/25/2017   Procedure: ENDOMETRIAL ABLATION With NOVASURE;  Surgeon: Adam Phenix, MD;  Location: [facility];  Service: Gynecology;  Laterality: N/A;   ESOPHAGEAL MANOMETRY N/A 07/27/2021   Procedure: ESOPHAGEAL MANOMETRY (EM);  Surgeon: Imogene Burn, MD;  Location: [facility];  Service: Gastroenterology;  Laterality: N/A;   TUBAL LIGATION  2003    Family History  Problem Relation Age of Onset   Hypertension Mother    Hypertension Maternal Aunt    Breast cancer Maternal Grandmother        dx > 50    Stomach cancer Maternal Grandfather        dx > 50   Breast cancer Paternal Grandmother        dx > 50   Esophageal cancer Neg Hx    Rectal cancer Neg Hx    Colon cancer Neg Hx    Social History:  reports that she has never smoked. She has never used smokeless tobacco. She reports that she does not currently use drugs after having used the following drugs: Marijuana. She reports that she does not drink alcohol.  Allergies:  Allergies  Allergen Reactions   Latex Other (See Comments)    Burn in vaginal area with latex condoms    No medications prior to admission.    No results found for this or any previous visit (from the past 48 hour(s)). 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 EXAM: BILATERAL BREAST MRI WITH AND WITHOUT CONTRAST TECHNIQUE: Multiplanar, multisequence MR images of both breasts were obtained prior to and following the intravenous administration of 10 ml of Vueway Three-dimensional MR images were rendered by post-processing of the original MR data on an independent workstation. The three-dimensional MR images were interpreted, and findings are reported in the following complete MRI report for this study. Three dimensional images were evaluated at the independent  interpreting workstation using the DynaCAD thin client. COMPARISON:  Prior mammograms FINDINGS: Breast composition: c. Heterogeneous fibroglandular tissue. Background parenchymal enhancement: Moderate Right breast: A 4 x 5 cm lumpectomy cavity within the central breast is identified. There is mild ill-defined enhancement along the anterior, inferior, medial and posterior margins of the lumpectomy cavity. No discrete mass is identified. Left breast: No suspicious mass or worrisome  enhancement noted. Lymph nodes: No abnormal appearing lymph nodes. Ancillary findings:  None. IMPRESSION: 1. Mild ill-defined enhancement along the lateral, inferior, medial and posterior margins of the 4 x 5 cm RIGHT breast lumpectomy cavity. This is indeterminate and may represent postsurgical changes or ill-defined malignancy given positive margins. 2. No other suspicious abnormalities within either breast. No abnormal appearing lymph nodes. RECOMMENDATION: Treatment plan/re-excision as clinically indicated. BI-RADS CATEGORY  6: Known biopsy-proven malignancy. Electronically Signed   By: Harmon Pier M.D.   On: 11/30/2022 15:29    Review of Systems  All other systems reviewed and are negative.   Last menstrual period 11/28/2022. Physical Exam Constitutional:      General: She is not in acute distress. HENT:     Head: Normocephalic and atraumatic.     Nose: Nose normal.  Cardiovascular:     Rate and Rhythm: Normal rate and regular rhythm.  Pulmonary:     Effort: Pulmonary effort is normal. No respiratory distress.  Skin:    General: Skin is warm and dry.  Neurological:     General: No focal deficit present.     Mental Status: She is alert.  Psychiatric:        Behavior: Behavior normal.   Breast incision healing well  Assessment/Plan Triple negative right breast cancer  The plan will be to proceed to the operating room for reexcision of the right breast cancer at the positive margins as well as a sentinel lymph node biopsy on the right side and a Port-A-Cath insertion for IV chemotherapy.  I discussed the results of the MRI with her.  We discussed the risk of the procedure which includes but is not limited to bleeding, infection, injury to surrounding structures, pneumothorax with port insertion, the need for further surgery if margins are still positive or lymph nodes were positive, seroma formation, cardiopulmonary issues with anesthesia, postoperative recovery, etc.  She understands  and agrees to proceed with surgery.  Abigail Miyamoto, MD 11/30/2022, 4:28 PM

## 2022-11-30 NOTE — Progress Notes (Signed)
Anesthesia Chart Review: Laurie Bell  Case: [id number] Date/Time: 12/01/22 0915   Procedures:      RE-EXCISION OF RIGHT BREAST CANCER (Right: Breast)     RIGHT AXILLARY SENTINEL NODE BIOPSY (Right)     INSERTION PORT-A-CATH WITH ULTRASOUND GUIDANCE   Anesthesia type: General   Pre-op diagnosis: RIGHT BREAST CANCER   [facility] / [facility]   Surgeons: Abigail Miyamoto, MD       DISCUSSION: Patient is a 42 year old female scheduled for the above procedure. 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 Inferior and positive margins positive, so the above procedure recommended.   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  She had ED visit on 11/25/22 for atypical chest pain, non-productive cough x 2 days. Negative troponin and D-dimer, so low suspicious for ACS or PE. She had tenderness to the left 5th and 6th ribs. CXR clear. Costochondritis suspected and given a dose of Decadron with return precautions.   She had primary care follow-up with Hoy Register, MD on 11/29/22. She reported a lot of anxiety relating to her new breast cancer diagnosis. Having palpitations. PCP reviewed ED notes and plans for re-excision of breast cancer margins. Patient reported neck pain and that throat felt irritated. No fever. Advised to use/continue Flonase, antihistamine, and prescribed a 5 day course of prednisone for neck pain, possible sinusitis.   She is followed by endocrinologist Dr. Lonzo Cloud for MNG and subclinical hyperthyroidism. Last visit 09/26/22 and noted, "The patient has not had her FNA of the right superior nodule yet because she declined due to extreme fear of needles." She on on Tapzole and Toprol. TFTS normal, so methimazole decreased to 5 mg M-F.    She has biopsy proven breast  cancer, and needs re-excision of margins and Port-a-cath insertion. Recent evaluation by PCP who noted surgery plans and started prednisone for 5 days for neck pain, throat irritation, possible sinusitis (no antibiotics prescribed). No fever.  She is a same day work up. Anesthesia team to evaluate on the day of surgery.   VS: LMP 11/28/2022 (Exact Date)  BP Readings from Last 3 Encounters:  11/29/22 111/74  11/28/22 95/63  11/25/22 (!) 125/99   Pulse Readings from Last 3 Encounters:  11/29/22 86  11/28/22 85  11/25/22 83     PROVIDERS: Hoy Register, MD is PCP  Serena Croissant, MD is HEM-ONC Lonie Peak, MD is RAD-ONC Nita Sickle, DO is neurology Lance Muss, MD is cardiologist. Visit in 05/2021 for palpitations with PVCs on EKG, started on b-blocker.. Normal LVEF, trivial MR on 08/2020 echo.  Shamleffer, Brent Bulla, MD is endocrinologist    LABS: Most recent labs in [facility] include: Lab Results  Component Value Date   WBC 11.7 (H) 11/25/2022   HGB 12.8 11/25/2022   HCT 38.4 11/25/2022   PLT 286 11/25/2022   GLUCOSE 103 (H) 11/25/2022   ALT 11 10/11/2022   AST 13 (L) 10/11/2022   NA 136 11/25/2022   K 3.4 (L) 11/25/2022   CL 104 11/25/2022   CREATININE 0.94 11/25/2022   BUN 9 11/25/2022   CO2 24 11/25/2022   TSH 1.59 09/26/2022   INR 1.2 12/24/2007   HGBA1C 5.6 01/18/2022     IMAGES: CXR 11/25/22: FINDINGS: No focal consolidation, pleural effusion, or pneumothorax. The cardiac silhouette is within normal limits. No acute  osseous pathology. IMPRESSION: No active cardiopulmonary disease.   [place] 10/23/22: IMPRESSION: 1. Multinodular thyroid gland. Several nodules demonstrate more discrete features on today's exam, and have been upgraded with corresponding recommendations as detailed. 2. Nodule labeled 1 in the mid right thyroid lobe (2.0 cm TR 5) appears to demonstrate interval enlargement, and continues to meet criteria for biopsy as previously  recommended. 3. Nodules labeled 2 and 3, both in the inferior right thyroid lobe, demonstrate more conspicuous features on today's exam, now warranting follow-up ultrasound in 1 year. A total follow-up interval of 5 years is recommended. - The above is in keeping with the ACR TI-RADS recommendations - J Am Coll Radiol 2017;14:587-595.   EKG: 11/27/22:  Sinus rhythm Low voltage, precordial leads Confirmed by Lockie Mola, Adam ([id number]) on 11/25/2022 10:14:56 PM - Occasional PVC   CV: Long term monitor 01/01/22 - 01/09/22: Normal sinus rhythm with frequent PVCs.   Patient symptoms reliably correlated to PVCs.   No sustained arrhythmias. - "Could consider low dose beta blocker if she wanted, but risk of fatigue and other side effects in a younger person."  Echo 08/26/20: IMPRESSIONS   1. Left ventricular ejection fraction, by estimation, is 65 to 70%. The  left ventricle has normal function. The left ventricle has no regional  wall motion abnormalities. Left ventricular diastolic parameters were  normal.   2. Right ventricular systolic function is normal. The right ventricular  size is normal.   3. The mitral valve is normal in structure. Trivial mitral valve  regurgitation.   4. The aortic valve is normal in structure. Aortic valve regurgitation is  not visualized.    Past Medical History:  Diagnosis Date   Anxiety    Arthritis    Breast cancer (HCC)    Chlamydia contact, treated    GERD (gastroesophageal reflux disease)    Hyperthyroidism     Past Surgical History:  Procedure Laterality Date   BREAST BIOPSY Right 10/02/2022   MM RT BREAST BX W LOC DEV 1ST LESION IMAGE BX SPEC STEREO GUIDE 10/02/2022 [facility]   BREAST BIOPSY  11/10/2022   MM RT RADIOACTIVE SEED LOC MAMMO GUIDE 11/10/2022 [facility]   BREAST LUMPECTOMY WITH RADIOACTIVE SEED LOCALIZATION Right 11/13/2022   Procedure: RIGHT BREAST LUMPECTOMY WITH RADIOACTIVE SEED LOCALIZATION;  Surgeon: Abigail Miyamoto, MD;  Location: [facility];  Service: General;  Laterality: Right;   ENDOMETRIAL ABLATION N/A 04/25/2017   Procedure: ENDOMETRIAL ABLATION With NOVASURE;  Surgeon: Adam Phenix, MD;  Location: [facility];  Service: Gynecology;  Laterality: N/A;   ESOPHAGEAL MANOMETRY N/A 07/27/2021   Procedure: ESOPHAGEAL MANOMETRY (EM);  Surgeon: Imogene Burn, MD;  Location: [facility];  Service: Gastroenterology;  Laterality: N/A;   TUBAL LIGATION  2003    MEDICATIONS: No current facility-administered medications for this encounter.    acetaminophen (TYLENOL) 500 MG tablet   albuterol (VENTOLIN HFA) 108 (90 Base) MCG/ACT inhaler   AMBULATORY NON FORMULARY MEDICATION   ASHWAGANDHA PO   cyclobenzaprine (FLEXERIL) 10 MG tablet   DULoxetine (CYMBALTA) 60 MG capsule   esomeprazole (NEXIUM) 40 MG capsule   fluticasone (FLONASE) 50 MCG/ACT nasal spray   gabapentin (NEURONTIN) 400 MG capsule   hydrOXYzine (ATARAX) 25 MG tablet   lidocaine (LIDODERM) 5 %   linaclotide (LINZESS) 145 MCG CAPS capsule   methimazole (TAPAZOLE) 5 MG tablet   methocarbamol (ROBAXIN) 500 MG tablet   metoprolol succinate (TOPROL-XL) 25 MG 24 hr tablet   naproxen (NAPROSYN) 500  MG tablet   ondansetron (ZOFRAN-ODT) 4 MG disintegrating tablet   oxyCODONE (OXY IR/ROXICODONE) 5 MG immediate release tablet   Phenylephrine-Acetaminophen (TYLENOL SINUS+HEADACHE PO)   predniSONE (DELTASONE) 20 MG tablet    Shonna Chock, PA-C Surgical Short Stay/Anesthesiology [facility] Phone [phone] [facility] Phone [phone] 11/30/2022 11:29 AM

## 2022-12-01 ENCOUNTER — Encounter (HOSPITAL_COMMUNITY)
Admission: RE | Disposition: A | Discharge status: Home / Self Care | Payer: Self-pay | Source: Home / Self Care | Attending: Surgery

## 2022-12-01 ENCOUNTER — Ambulatory Visit (HOSPITAL_COMMUNITY): Payer: 59

## 2022-12-01 ENCOUNTER — Telehealth: Payer: Self-pay | Admitting: Hematology and Oncology

## 2022-12-01 ENCOUNTER — Ambulatory Visit (HOSPITAL_COMMUNITY): Payer: 59 | Admitting: Vascular Surgery

## 2022-12-01 ENCOUNTER — Encounter (HOSPITAL_COMMUNITY): Payer: Self-pay | Admitting: Surgery

## 2022-12-01 ENCOUNTER — Ambulatory Visit (HOSPITAL_BASED_OUTPATIENT_CLINIC_OR_DEPARTMENT_OTHER): Payer: 59 | Admitting: Vascular Surgery

## 2022-12-01 ENCOUNTER — Other Ambulatory Visit: Payer: Self-pay

## 2022-12-01 ENCOUNTER — Ambulatory Visit (HOSPITAL_COMMUNITY)
Admission: RE | Admit: 2022-12-01 | Discharge: 2022-12-01 | Disposition: A | Discharge status: Home / Self Care | Payer: 59 | Attending: Surgery | Admitting: Surgery

## 2022-12-01 DIAGNOSIS — C50911 Malignant neoplasm of unspecified site of right female breast: Secondary | ICD-10-CM

## 2022-12-01 DIAGNOSIS — G8918 Other acute postprocedural pain: Secondary | ICD-10-CM | POA: Diagnosis not present

## 2022-12-01 DIAGNOSIS — F418 Other specified anxiety disorders: Secondary | ICD-10-CM | POA: Diagnosis not present

## 2022-12-01 DIAGNOSIS — Z171 Estrogen receptor negative status [ER-]: Secondary | ICD-10-CM | POA: Insufficient documentation

## 2022-12-01 DIAGNOSIS — E0590 Thyrotoxicosis, unspecified without thyrotoxic crisis or storm: Secondary | ICD-10-CM

## 2022-12-01 DIAGNOSIS — E0520 Thyrotoxicosis with toxic multinodular goiter without thyrotoxic crisis or storm: Secondary | ICD-10-CM | POA: Insufficient documentation

## 2022-12-01 DIAGNOSIS — F419 Anxiety disorder, unspecified: Secondary | ICD-10-CM | POA: Insufficient documentation

## 2022-12-01 DIAGNOSIS — K219 Gastro-esophageal reflux disease without esophagitis: Secondary | ICD-10-CM | POA: Insufficient documentation

## 2022-12-01 DIAGNOSIS — I493 Ventricular premature depolarization: Secondary | ICD-10-CM | POA: Diagnosis not present

## 2022-12-01 DIAGNOSIS — C50811 Malignant neoplasm of overlapping sites of right female breast: Secondary | ICD-10-CM | POA: Diagnosis not present

## 2022-12-01 DIAGNOSIS — D0511 Intraductal carcinoma in situ of right breast: Secondary | ICD-10-CM | POA: Diagnosis not present

## 2022-12-01 DIAGNOSIS — Z452 Encounter for adjustment and management of vascular access device: Secondary | ICD-10-CM | POA: Diagnosis not present

## 2022-12-01 HISTORY — PX: AXILLARY SENTINEL NODE BIOPSY: SHX5738

## 2022-12-01 HISTORY — PX: PORTACATH PLACEMENT: SHX2246

## 2022-12-01 HISTORY — PX: RE-EXCISION OF BREAST CANCER,SUPERIOR MARGINS: SHX6047

## 2022-12-01 SURGERY — RE-EXCISION OF BREAST CANCER,SUPERIOR MARGINS
Anesthesia: General | Site: Breast | Laterality: Right

## 2022-12-01 MED ORDER — CHLORHEXIDINE GLUCONATE CLOTH 2 % EX PADS: 6.0000 | MEDICATED_PAD | Freq: Once | CUTANEOUS | Status: DC

## 2022-12-01 MED ORDER — MAGTRACE LYMPHATIC TRACER
INTRAMUSCULAR | Status: DC | PRN
  Administered 2022-12-01: 2 mL via INTRAMUSCULAR

## 2022-12-01 MED ORDER — FENTANYL CITRATE (PF) 250 MCG/5ML IJ SOLN
INTRAMUSCULAR | Status: AC
  Filled 2022-12-01: qty 5

## 2022-12-01 MED ORDER — OXYCODONE HCL 5 MG PO TABS: 5.0000 mg | ORAL_TABLET | Freq: Four times a day (QID) | ORAL | 0 refills | Status: DC | PRN

## 2022-12-01 MED ORDER — KETOROLAC TROMETHAMINE 30 MG/ML IJ SOLN
INTRAMUSCULAR | Status: AC
  Filled 2022-12-01: qty 1

## 2022-12-01 MED ORDER — FENTANYL CITRATE (PF) 100 MCG/2ML IJ SOLN
25.0000 ug | INTRAMUSCULAR | Status: DC | PRN
  Administered 2022-12-01 (×2): 50 ug via INTRAVENOUS

## 2022-12-01 MED ORDER — ONDANSETRON HCL 4 MG/2ML IJ SOLN
INTRAMUSCULAR | Status: DC | PRN
  Administered 2022-12-01: 4 mg via INTRAVENOUS

## 2022-12-01 MED ORDER — CEFAZOLIN SODIUM-DEXTROSE 2-4 GM/100ML-% IV SOLN
INTRAVENOUS | Status: AC
  Filled 2022-12-01: qty 100

## 2022-12-01 MED ORDER — AMISULPRIDE (ANTIEMETIC) 5 MG/2ML IV SOLN
10.0000 mg | Freq: Once | INTRAVENOUS | Status: AC | PRN
  Administered 2022-12-01: 10 mg via INTRAVENOUS

## 2022-12-01 MED ORDER — LACTATED RINGERS IV SOLN: INTRAVENOUS | Status: DC

## 2022-12-01 MED ORDER — FENTANYL CITRATE (PF) 100 MCG/2ML IJ SOLN: 50.0000 ug | Freq: Once | INTRAMUSCULAR | Status: AC

## 2022-12-01 MED ORDER — FENTANYL CITRATE (PF) 100 MCG/2ML IJ SOLN
INTRAMUSCULAR | Status: AC
  Administered 2022-12-01: 50 ug via INTRAVENOUS
  Filled 2022-12-01: qty 2

## 2022-12-01 MED ORDER — MIDAZOLAM HCL 2 MG/2ML IJ SOLN
INTRAMUSCULAR | Status: AC
  Administered 2022-12-01: 2 mg via INTRAVENOUS
  Filled 2022-12-01: qty 2

## 2022-12-01 MED ORDER — ACETAMINOPHEN 500 MG PO TABS
ORAL_TABLET | ORAL | Status: AC
  Administered 2022-12-01: 1000 mg via ORAL
  Filled 2022-12-01: qty 2

## 2022-12-01 MED ORDER — ENSURE PRE-SURGERY PO LIQD: 296.0000 mL | Freq: Once | ORAL | Status: DC

## 2022-12-01 MED ORDER — LIDOCAINE 2% (20 MG/ML) 5 ML SYRINGE
INTRAMUSCULAR | Status: DC | PRN
  Administered 2022-12-01: 40 mg via INTRAVENOUS

## 2022-12-01 MED ORDER — CHLORHEXIDINE GLUCONATE 0.12 % MT SOLN
OROMUCOSAL | Status: AC
  Administered 2022-12-01: 15 mL via OROMUCOSAL
  Filled 2022-12-01: qty 15

## 2022-12-01 MED ORDER — DEXAMETHASONE SODIUM PHOSPHATE 10 MG/ML IJ SOLN
INTRAMUSCULAR | Status: DC | PRN
  Administered 2022-12-01: 5 mg via INTRAVENOUS

## 2022-12-01 MED ORDER — LIDOCAINE 2% (20 MG/ML) 5 ML SYRINGE
INTRAMUSCULAR | Status: AC
  Filled 2022-12-01: qty 5

## 2022-12-01 MED ORDER — ORAL CARE MOUTH RINSE: 15.0000 mL | Freq: Once | OROMUCOSAL | Status: AC

## 2022-12-01 MED ORDER — KETOROLAC TROMETHAMINE 30 MG/ML IJ SOLN
INTRAMUSCULAR | Status: DC | PRN
  Administered 2022-12-01: 30 mg via INTRAVENOUS

## 2022-12-01 MED ORDER — FENTANYL CITRATE (PF) 250 MCG/5ML IJ SOLN
INTRAMUSCULAR | Status: DC | PRN
  Administered 2022-12-01: 100 ug via INTRAVENOUS

## 2022-12-01 MED ORDER — CEFAZOLIN SODIUM-DEXTROSE 2-4 GM/100ML-% IV SOLN
2.0000 g | INTRAVENOUS | Status: AC
  Administered 2022-12-01: 2 g via INTRAVENOUS
  Filled 2022-12-01: qty 100

## 2022-12-01 MED ORDER — HEPARIN SOD (PORK) LOCK FLUSH 100 UNIT/ML IV SOLN
INTRAVENOUS | Status: DC | PRN
  Administered 2022-12-01: 500 [IU] via INTRAVENOUS

## 2022-12-01 MED ORDER — AMISULPRIDE (ANTIEMETIC) 5 MG/2ML IV SOLN
INTRAVENOUS | Status: AC
  Filled 2022-12-01: qty 4

## 2022-12-01 MED ORDER — MIDAZOLAM HCL 2 MG/2ML IJ SOLN: 2.0000 mg | Freq: Once | INTRAMUSCULAR | Status: AC

## 2022-12-01 MED ORDER — FENTANYL CITRATE (PF) 100 MCG/2ML IJ SOLN
INTRAMUSCULAR | Status: AC
  Filled 2022-12-01: qty 2

## 2022-12-01 MED ORDER — OXYCODONE HCL 5 MG PO TABS
ORAL_TABLET | ORAL | Status: AC
  Filled 2022-12-01: qty 1

## 2022-12-01 MED ORDER — HEPARIN 6000 UNIT IRRIGATION SOLUTION
Status: DC | PRN
  Administered 2022-12-01: 1

## 2022-12-01 MED ORDER — FENTANYL CITRATE (PF) 100 MCG/2ML IJ SOLN
50.0000 ug | Freq: Once | INTRAMUSCULAR | Status: AC
  Administered 2022-12-01: 50 ug via INTRAVENOUS

## 2022-12-01 MED ORDER — ONDANSETRON HCL 4 MG/2ML IJ SOLN
INTRAMUSCULAR | Status: AC
  Filled 2022-12-01: qty 2

## 2022-12-01 MED ORDER — BUPIVACAINE-EPINEPHRINE 0.25% -1:200000 IJ SOLN
INTRAMUSCULAR | Status: DC | PRN
  Administered 2022-12-01: 14 mL
  Administered 2022-12-01: 12 mL

## 2022-12-01 MED ORDER — DEXAMETHASONE SODIUM PHOSPHATE 10 MG/ML IJ SOLN
INTRAMUSCULAR | Status: AC
  Filled 2022-12-01: qty 1

## 2022-12-01 MED ORDER — PROPOFOL 10 MG/ML IV BOLUS
INTRAVENOUS | Status: DC | PRN
  Administered 2022-12-01: 200 mg via INTRAVENOUS

## 2022-12-01 MED ORDER — ACETAMINOPHEN 500 MG PO TABS
1000.0000 mg | ORAL_TABLET | ORAL | Status: AC
  Filled 2022-12-01: qty 2

## 2022-12-01 MED ORDER — HEPARIN 6000 UNIT IRRIGATION SOLUTION
Status: AC
  Filled 2022-12-01: qty 500

## 2022-12-01 MED ORDER — 0.9 % SODIUM CHLORIDE (POUR BTL) OPTIME
TOPICAL | Status: DC | PRN
  Administered 2022-12-01: 1000 mL

## 2022-12-01 MED ORDER — HEPARIN SOD (PORK) LOCK FLUSH 100 UNIT/ML IV SOLN
INTRAVENOUS | Status: AC
  Filled 2022-12-01: qty 5

## 2022-12-01 MED ORDER — CLONIDINE HCL (ANALGESIA) 100 MCG/ML EP SOLN
EPIDURAL | Status: DC | PRN
  Administered 2022-12-01: 50 ug

## 2022-12-01 MED ORDER — ROPIVACAINE HCL 5 MG/ML IJ SOLN
INTRAMUSCULAR | Status: DC | PRN
  Administered 2022-12-01: 30 mL via PERINEURAL

## 2022-12-01 MED ORDER — PHENYLEPHRINE 80 MCG/ML (10ML) SYRINGE FOR IV PUSH (FOR BLOOD PRESSURE SUPPORT)
PREFILLED_SYRINGE | INTRAVENOUS | Status: DC | PRN
  Administered 2022-12-01: 160 ug via INTRAVENOUS

## 2022-12-01 MED ORDER — CHLORHEXIDINE GLUCONATE 0.12 % MT SOLN
15.0000 mL | Freq: Once | OROMUCOSAL | Status: AC
  Filled 2022-12-01: qty 15

## 2022-12-01 MED ORDER — OXYCODONE HCL 5 MG PO TABS
5.0000 mg | ORAL_TABLET | Freq: Once | ORAL | Status: AC
  Administered 2022-12-01: 5 mg via ORAL

## 2022-12-01 MED ORDER — BUPIVACAINE-EPINEPHRINE (PF) 0.25% -1:200000 IJ SOLN
INTRAMUSCULAR | Status: AC
  Filled 2022-12-01: qty 30

## 2022-12-01 SURGICAL SUPPLY — 71 items
ADH SKN CLS APL DERMABOND .7 (GAUZE/BANDAGES/DRESSINGS) ×6
ADH SKN CLS LQ APL DERMABOND (GAUZE/BANDAGES/DRESSINGS) ×6
APL PRP STRL LF DISP 70% ISPRP (MISCELLANEOUS) ×3
APL PRP STRL LF ISPRP CHG 10.5 (MISCELLANEOUS) ×3
APPLICATOR CHLORAPREP 10.5 ORG (MISCELLANEOUS) ×4 IMPLANT
APPLIER CLIP 11 MED OPEN (CLIP) ×3
APR CLP MED 11 20 MLT OPN (CLIP) ×3
BAG COUNTER SPONGE SURGICOUNT (BAG) ×4 IMPLANT
BAG DECANTER FOR FLEXI CONT (MISCELLANEOUS) ×4 IMPLANT
BAG SPNG CNTER NS LX DISP (BAG) ×3
CANISTER SUCT 3000ML PPV (MISCELLANEOUS) IMPLANT
CHLORAPREP W/TINT 26 (MISCELLANEOUS) ×4 IMPLANT
CLIP APPLIE 11 MED OPEN (CLIP) IMPLANT
CNTNR URN SCR LID CUP LEK RST (MISCELLANEOUS) ×4 IMPLANT
CONT SPEC 4OZ STRL OR WHT (MISCELLANEOUS) ×3
COVER SURGICAL LIGHT HANDLE (MISCELLANEOUS) ×4 IMPLANT
COVER TRANSDUCER ULTRASND GEL (DISPOSABLE) IMPLANT
DERMABOND ADVANCED .7 DNX12 (GAUZE/BANDAGES/DRESSINGS) ×4 IMPLANT
DERMABOND ADVANCED .7 DNX6 (GAUZE/BANDAGES/DRESSINGS) IMPLANT
DRAPE C-ARM 42X120 X-RAY (DRAPES) ×4 IMPLANT
DRAPE CHEST BREAST 15X10 FENES (DRAPES) ×4 IMPLANT
DRAPE LAPAROTOMY 100X72 PEDS (DRAPES) ×4 IMPLANT
DRSG TEGADERM 4X4.75 (GAUZE/BANDAGES/DRESSINGS) ×4 IMPLANT
ELECT CAUTERY BLADE 6.4 (BLADE) ×4 IMPLANT
ELECT REM PT RETURN 9FT ADLT (ELECTROSURGICAL) ×3
ELECTRODE REM PT RTRN 9FT ADLT (ELECTROSURGICAL) ×4 IMPLANT
GAUZE 4X4 16PLY ~~LOC~~+RFID DBL (SPONGE) ×4 IMPLANT
GAUZE SPONGE 2X2 8PLY STRL LF (GAUZE/BANDAGES/DRESSINGS) ×4 IMPLANT
GAUZE SPONGE 4X4 12PLY STRL (GAUZE/BANDAGES/DRESSINGS) ×4 IMPLANT
GAUZE SPONGE 4X4 12PLY STRL LF (GAUZE/BANDAGES/DRESSINGS) ×4 IMPLANT
GEL ULTRASOUND 20GR AQUASONIC (MISCELLANEOUS) IMPLANT
GLOVE SURG SIGNA 7.5 PF LTX (GLOVE) ×4 IMPLANT
GOWN STRL REUS W/ TWL LRG LVL3 (GOWN DISPOSABLE) ×4 IMPLANT
GOWN STRL REUS W/ TWL XL LVL3 (GOWN DISPOSABLE) ×4 IMPLANT
GOWN STRL REUS W/TWL LRG LVL3 (GOWN DISPOSABLE) ×3
GOWN STRL REUS W/TWL XL LVL3 (GOWN DISPOSABLE) ×3
INTRODUCER COOK 11FR (CATHETERS) IMPLANT
KIT BASIN OR (CUSTOM PROCEDURE TRAY) ×4 IMPLANT
KIT PORT POWER 8FR ISP CVUE (Port) IMPLANT
KIT TURNOVER KIT B (KITS) ×4 IMPLANT
NDL FILTER BLUNT 18X1 1/2 (NEEDLE) IMPLANT
NDL HYPO 25GX1X1/2 BEV (NEEDLE) ×4 IMPLANT
NEEDLE FILTER BLUNT 18X1 1/2 (NEEDLE) IMPLANT
NEEDLE HYPO 25GX1X1/2 BEV (NEEDLE) ×3 IMPLANT
NS IRRIG 1000ML POUR BTL (IV SOLUTION) ×4 IMPLANT
PACK GENERAL/GYN (CUSTOM PROCEDURE TRAY) ×4 IMPLANT
PAD ARMBOARD 7.5X6 YLW CONV (MISCELLANEOUS) ×4 IMPLANT
PENCIL BUTTON HOLSTER BLD 10FT (ELECTRODE) ×4 IMPLANT
PENCIL SMOKE EVACUATOR (MISCELLANEOUS) ×4 IMPLANT
POSITIONER HEAD DONUT 9IN (MISCELLANEOUS) ×4 IMPLANT
SET INTRODUCER 12FR PACEMAKER (INTRODUCER) IMPLANT
SET SHEATH INTRODUCER 10FR (MISCELLANEOUS) IMPLANT
SHEATH COOK PEEL AWAY SET 9F (SHEATH) IMPLANT
SPECIMEN JAR MEDIUM (MISCELLANEOUS) ×4 IMPLANT
SPIKE FLUID TRANSFER (MISCELLANEOUS) ×4 IMPLANT
SPONGE T-LAP 4X18 ~~LOC~~+RFID (SPONGE) ×4 IMPLANT
SUT MNCRL AB 4-0 PS2 18 (SUTURE) ×4 IMPLANT
SUT PROLENE 2 0 SH 30 (SUTURE) ×4 IMPLANT
SUT PROLENE 2 0 SH DA (SUTURE) IMPLANT
SUT SILK 2 0 (SUTURE)
SUT SILK 2-0 18XBRD TIE 12 (SUTURE) IMPLANT
SUT VIC AB 3-0 SH 27 (SUTURE) ×6
SUT VIC AB 3-0 SH 27X BRD (SUTURE) ×4 IMPLANT
SUT VIC AB 3-0 SH 27XBRD (SUTURE) ×4 IMPLANT
SYR 5ML LUER SLIP (SYRINGE) ×4 IMPLANT
SYR CONTROL 10ML LL (SYRINGE) ×4 IMPLANT
TOWEL GREEN STERILE (TOWEL DISPOSABLE) ×4 IMPLANT
TOWEL GREEN STERILE FF (TOWEL DISPOSABLE) ×4 IMPLANT
TRAY LAPAROSCOPIC MC (CUSTOM PROCEDURE TRAY) ×4 IMPLANT
TUBE SUCT ARGYLE STRL (TUBING) IMPLANT
YANKAUER SUCT BULB TIP NO VENT (SUCTIONS) IMPLANT

## 2022-12-01 NOTE — Interval H&P Note (Signed)
History and Physical Interval Note:no change in H and P  12/01/2022 9:11 AM  Laurie Bell  has presented today for surgery, with the diagnosis of RIGHT BREAST CANCER.  The various methods of treatment have been discussed with the patient and family. After consideration of risks, benefits and other options for treatment, the patient has consented to  Procedure(s): RE-EXCISION OF RIGHT BREAST CANCER (Right) RIGHT AXILLARY SENTINEL NODE BIOPSY (Right) INSERTION PORT-A-CATH WITH ULTRASOUND GUIDANCE (N/A) as a surgical intervention.  The patient's history has been reviewed, patient examined, no change in status, stable for surgery.  I have reviewed the patient's chart and labs.  Questions were answered to the patient's satisfaction.     Laurie Bell

## 2022-12-01 NOTE — Anesthesia Procedure Notes (Signed)
Anesthesia Regional Block: Pectoralis block   Pre-Anesthetic Checklist: , timeout performed,  Correct Patient, Correct Site, Correct Laterality,  Correct Procedure, Correct Position, site marked,  Risks and benefits discussed,  Surgical consent,  Pre-op evaluation,  At surgeon's request and post-op pain management  Laterality: Right  Prep: chloraprep       Needles:  Injection technique: Single-shot  Needle Type: Echogenic Needle     Needle Length: 9cm  Needle Gauge: 21     Additional Needles:   Procedures:,,,, ultrasound used (permanent image in chart),,    Narrative:  Start time: 12/01/2022 9:30 AM End time: 12/01/2022 9:35 AM Injection made incrementally with aspirations every 5 mL.  Performed by: Personally  Anesthesiologist: Marcene Duos, MD

## 2022-12-01 NOTE — Telephone Encounter (Signed)
Scheduled appointment per los. Patient is aware of the made appointment. 

## 2022-12-01 NOTE — Anesthesia Procedure Notes (Signed)
Procedure Name: LMA Insertion Date/Time: 12/01/2022 9:48 AM  Performed by: Gus Puma, CRNAPre-anesthesia Checklist: Patient identified, Emergency Drugs available, Suction available and Patient being monitored Patient Re-evaluated:Patient Re-evaluated prior to induction Oxygen Delivery Method: Circle System Utilized Preoxygenation: Pre-oxygenation with 100% oxygen Induction Type: IV induction Ventilation: Mask ventilation without difficulty LMA: LMA inserted LMA Size: 4.0 Number of attempts: 1 Airway Equipment and Method: Bite block Placement Confirmation: positive ETCO2 Tube secured with: Tape Dental Injury: Teeth and Oropharynx as per pre-operative assessment

## 2022-12-01 NOTE — Discharge Instructions (Signed)
[facility] Office Phone Number [phone]  BREAST BIOPSY/ PARTIAL MASTECTOMY: POST OP INSTRUCTIONS  Always review your discharge instruction sheet given to you by the facility where your surgery was performed.  IF YOU HAVE DISABILITY OR FAMILY LEAVE FORMS, YOU MUST BRING THEM TO THE OFFICE FOR PROCESSING.  DO NOT GIVE THEM TO YOUR DOCTOR.  A prescription for pain medication may be given to you upon discharge.  Take your pain medication as prescribed, if needed.  If narcotic pain medicine is not needed, then you may take acetaminophen (Tylenol) or ibuprofen (Advil) as needed. Take your usually prescribed medications unless otherwise directed If you need a refill on your pain medication, please contact your pharmacy.  They will contact our office to request authorization.  Prescriptions will not be filled after 5pm or on week-ends. You should eat very light the first 24 hours after surgery, such as soup, crackers, pudding, etc.  Resume your normal diet the day after surgery. Most patients will experience some swelling and bruising in the breast.  Ice packs and a good support bra will help.  Swelling and bruising can take several days to resolve.  It is common to experience some constipation if taking pain medication after surgery.  Increasing fluid intake and taking a stool softener will usually help or prevent this problem from occurring.  A mild laxative (Milk of Magnesia or Miralax) should be taken according to package directions if there are no bowel movements after 48 hours. Unless discharge instructions indicate otherwise, you may remove your bandages 24-48 hours after surgery, and you may shower at that time.  You may have steri-strips (small skin tapes) in place directly over the incision.  These strips should be left on the skin for 7-10 days.  If your surgeon used skin glue on the incision, you may shower in 24 hours.  The glue will flake off over the next 2-3 weeks.  Any  sutures or staples will be removed at the office during your follow-up visit. ACTIVITIES:  You may resume regular daily activities (gradually increasing) beginning the next day.  Wearing a good support bra or sports bra minimizes pain and swelling.  You may have sexual intercourse when it is comfortable. You may drive when you no longer are taking prescription pain medication, you can comfortably wear a seatbelt, and you can safely maneuver your car and apply brakes. RETURN TO WORK:  ______________________________________________________________________________________ You should see your doctor in the office for a follow-up appointment approximately two weeks after your surgery.  Your doctor's nurse will typically make your follow-up appointment when she calls you with your pathology report.  Expect your pathology report 2-3 business days after your surgery.  You may call to check if you do not hear from us after three days. OTHER INSTRUCTIONS: _______________________________________________________________________________________________ _____________________________________________________________________________________________________________________________________ _____________________________________________________________________________________________________________________________________ _____________________________________________________________________________________________________________________________________  WHEN TO CALL YOUR DOCTOR: Fever over 101.0 Nausea and/or vomiting. Extreme swelling or bruising. Continued bleeding from incision. Increased pain, redness, or drainage from the incision.  The clinic staff is available to answer your questions during regular business hours.  Please don't hesitate to call and ask to speak to one of the nurses for clinical concerns.  If you have a medical emergency, go to the nearest emergency room or call 911.  A surgeon from [facility] is always on call at the hospital.  For further questions, please visit [email or web address]   

## 2022-12-01 NOTE — Op Note (Signed)
Laurie Bell 12/01/2022   Pre-op Diagnosis: RIGHT BREAST CANCER     Post-op Diagnosis: same  Procedure(s): RE-EXCISION OF RIGHT BREAST CANCER RIGHT DEEP AXILLARY SENTINEL NODE BIOPSY INJECTION OF MAG TRACE FOR LYMPH NODE MAPPING LEFT SUBCLAVIAN PORT A CATH INSERTION (8FR)  Surgeon(s): Abigail Miyamoto, MD Carollee Herter, MD [facility] Resident  Anesthesia: General  Staff:  Circulator: Lennox Laity, RN Scrub Person: Madilyn Fireman, Amy E  Estimated Blood Loss: Minimal               Specimens: The inferior right breast margin and posterior right breast margin were sent separately to pathology along with sentinel lymph nodes  Indications: This is a 42 year old female who had undergone a radioactive seed guided 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  The posterior margin and inferior margin were positive.  An MRI was performed which showed no other abnormalities in either breast and no enlarged lymph nodes.  The decision was made to proceed with reexcision of the inferior and posterior margin as well as sentinel lymph node biopsy and Port-A-Cath insertion as she will need IV chemotherapy  Procedure: The patient was brought to the operating identified as correct patient.  She was placed upon the operating table and general anesthesia was induced.  Her right breast was then prepped and draped in the usual sterile fashion.  Prior to this I did inject MAC trace underneath the right nipple areolar complex and massaged the breast.  The patient's left chest and neck were then prepped and draped in usual sterile fashion.  I anesthetized skin on the left chest with lidocaine.  The patient was placed in the Trendelenburg position.  Using the introducer needle I then cannulated the left subclavian vein.  I passed a wire through the needle into the central venous system under  fluoroscopy.  I then removed the needle leaving the guidewire in place.  I then created an incision at the needle introduction site transversely across the upper chest with a scalpel.  I dissected down to the subcutaneous tissue with the cautery and then created a pocket for the port.  An 8 French [place] port was brought to the field and flushed appropriately.  I passed the introducer sheath and dilator over the wire and into the central venous system.  I then removed the dilator and the wire.  The catheter was attached to the port and catheter appropriate length.  I placed the port into the chest pocket and then fed the catheter down the peel-away sheath.  The sheath was then peeled away leaving the catheter and central venous system.  Fluoroscopy showed that the catheter was terminating in the distal atrium or ventricle so I pulled it back approximately 5 cm into the appropriate location.  I accessed the port good flush and return were demonstrated.  I sewed the port to the chest wall with a 3-0 Prolene suture.  We then instilled concentrated heparin solution into the port.  The subcutaneous tissue was then closed with interrupted 3-0 Vicryl sutures and the skin was closed with running 4-0 Monocryl.  Dermabond was then applied.  We then reprepped and draped the patient's right breast.  We anesthetized the skin at the incision at the upper edge of the areola with Marcaine.  We then opened up the previous incision with a scalpel and entered the seroma cavity from the lumpectomy site.  We  grasped the inferior margin with Allis clamps and excised the inferior margin going down to the chest wall with the cautery.  The most inferior margin was marked with paint and the specimen was sent to pathology.  We then grasped the posterior margin and made an circular incision around the entire edge of the lumpectomy cavity and excised the posterior margin going down the chest wall with the cautery.  We marked the deepest  posterior margin with paint.  This was then sent to pathology as well.  Hemostasis was achieved with cautery.  Using the mag trace probe I then identified increased area of uptake in the patient's right axilla.  We anesthetized skin with Marcaine.  We then made an incision and dissected down into the deep axillary space with electrocautery.  With the aid of the mag trace probe we easily identify 2 sentinel lymph nodes which were grasped and then excised with the cautery.  No further increased uptake was identified.  These nodes were sent to pathology for evaluation.  There were no enlarged lymph nodes in the axilla.  We placed surgical clips at the periphery of the lumpectomy cavity.  We then closed both incisions with interrupted 3-0 Vicryl sutures and running 4-0 Monocryl sutures.  Dermabond was then applied.  The patient tolerated the procedure well.  All the counts were correct at the end of the procedure.  The patient was then extubated in the operating room and taken in stable condition to the recovery room.          Abigail Miyamoto   Date: 12/01/2022  Time: 11:07 AM

## 2022-12-01 NOTE — Transfer of Care (Signed)
Immediate Anesthesia Transfer of Care Note  Patient: Laurie Bell  Procedure(s) Performed: RE-EXCISION OF RIGHT BREAST CANCER (Right: Breast) RIGHT AXILLARY SENTINEL NODE BIOPSY (Right) INSERTION PORT-A-CATH WITH ULTRASOUND GUIDANCE  Patient Location: PACU  Anesthesia Type:General  Level of Consciousness: drowsy and patient cooperative  Airway & Oxygen Therapy: Patient Spontanous Breathing  Post-op Assessment: Report given to RN and Post -op Vital signs reviewed and stable  Post vital signs: Reviewed and stable  Last Vitals:  Vitals Value Taken Time  BP 118/87 12/01/22 1120  Temp    Pulse 86 12/01/22 1123  Resp 13 12/01/22 1123  SpO2 99 % 12/01/22 1123  Vitals shown include unvalidated device data.  Last Pain:  Vitals:   12/01/22 0910  TempSrc:   PainSc: 0-No pain      Patients Stated Pain Goal: 0 (12/01/22 0910)  Complications: No notable events documented.

## 2022-12-01 NOTE — Anesthesia Postprocedure Evaluation (Signed)
Anesthesia Post Note  Patient: [name]  Procedure(s) Performed: RE-EXCISION OF RIGHT BREAST CANCER (Right: Breast) RIGHT AXILLARY SENTINEL NODE BIOPSY (Right) INSERTION PORT-A-CATH WITH ULTRASOUND GUIDANCE     Patient location during evaluation: PACU Anesthesia Type: General Level of consciousness: awake and alert Pain management: pain level controlled Vital Signs Assessment: post-procedure vital signs reviewed and stable Respiratory status: spontaneous breathing, nonlabored ventilation, respiratory function stable and patient connected to nasal cannula oxygen Cardiovascular status: blood pressure returned to baseline and stable Postop Assessment: no apparent nausea or vomiting Anesthetic complications: no  No notable events documented.  Last Vitals:  Vitals:   12/01/22 1300 12/01/22 1315  BP: (!) 125/91 119/86  Pulse: 74 74  Resp: 11 12  Temp:    SpO2: 98% 96%    Last Pain:  Vitals:   12/01/22 1315  TempSrc:   PainSc: Asleep                 Kennieth Rad

## 2022-12-03 ENCOUNTER — Encounter (HOSPITAL_COMMUNITY): Payer: Self-pay | Admitting: Surgery

## 2022-12-04 LAB — SURGICAL PATHOLOGY

## 2022-12-05 ENCOUNTER — Encounter: Payer: Self-pay | Admitting: Internal Medicine

## 2022-12-05 ENCOUNTER — Telehealth: Payer: Self-pay

## 2022-12-05 ENCOUNTER — Telehealth: Payer: Self-pay | Admitting: Internal Medicine

## 2022-12-05 NOTE — Telephone Encounter (Signed)
Copied from CRM [id number]. Topic: General - Other >> Dec 05, 2022 11:00 AM Everette C wrote: Reason for CRM: The patient would like to be contacted directly by a member of practice staff to continue previous discussions related to their recent labs   The patient shares that they received a MyChart message from a member of clinical staff regarding recent test results   Please contact further when possible

## 2022-12-05 NOTE — Telephone Encounter (Signed)
Spoke with patient . Verified name & DOB   Patient voiced that she was confused at the message received by the provider on last night. Voiced that she was told that all of her Cancer was gone and that she would be receiving radiation as a precaution. Patient voiced that the message she received sounded as she still had cancer. She wanted to  know who the Doctor was that sent the message because she did not know them.  She wanted to know if she still has cancer. Advised the patient that she should defer to her surgeon and oncologist. Advised the patient that I would ensure that provider is made aware of her message as requested.

## 2022-12-05 NOTE — Telephone Encounter (Signed)
Phone call placed to patient today to clarify MyChart message that I sent to her regarding pathology results from recent excision of lesion from her right breast.  Patient states she was confused as  she was told by the surgeon that the cancer was gone.  I informed the patient that one of the areas that they removed showed some scant DCIS cells but looks like they got it all as margins good.  She tells me she is about to start chemo-XRT to prevent it from coming back.  She accepted my explanation and apology for causing any anxiety on her part in regards to the results.

## 2022-12-08 ENCOUNTER — Other Ambulatory Visit: Payer: Self-pay | Admitting: *Deleted

## 2022-12-08 DIAGNOSIS — C50411 Malignant neoplasm of upper-outer quadrant of right female breast: Secondary | ICD-10-CM

## 2022-12-09 NOTE — Progress Notes (Signed)
Patient Care Team: Hoy Register, MD as PCP - General (Family Medicine) Corky Crafts, MD as PCP - Cardiology (Cardiology) Glendale Chard, DO as Consulting Physician (Neurology) Abigail Miyamoto, MD as Consulting Physician (General Surgery) Serena Croissant, MD as Consulting Physician (Hematology and Oncology) Lonie Peak, MD as Attending Physician (Radiation Oncology) Pershing Proud, RN as Oncology Nurse Navigator Donnelly Angelica, RN as Oncology Nurse Navigator  DIAGNOSIS: No diagnosis found.  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, ER 30% weak, PR 2%   10/11/2022 Cancer Staging   Staging form: Breast, AJCC 8th Edition - Clinical: Stage 0 (cTis (DCIS), cN0, cM0, G3, ER+, PR+, HER2: Not Assessed) - Signed by Serena Croissant, MD on 10/11/2022 Stage prefix: Initial diagnosis Histologic grading system: 3 grade system   10/11/2022 Genetic Testing   Declined hereditary cancer genetic testing    11/13/2022 Surgery   Right lumpectomy: Grade 3 IDC, 2 cm in size inferior and posterior margins are positive, DCIS posterior margin positive, lymphovascular invasion not identified, ER 0%, PR 0%, Ki-67 85%, HER2 0 by IHC     CHIEF COMPLIANT: Follow-up after surgery  INTERVAL HISTORY: Laurie Bell is a  42 y.o. female is here because of ER/PR negative. She presents to the clinic for a follow-up.    ALLERGIES:  is allergic to latex.  MEDICATIONS:  Current Outpatient Medications  Medication Sig Dispense Refill   acetaminophen (TYLENOL) 500 MG tablet Take 1,000 mg by mouth every 6 (six) hours as needed for moderate pain.     albuterol (VENTOLIN HFA) 108 (90 Base) MCG/ACT inhaler Inhale 2  puffs into the lungs every 6 (six) hours as needed for wheezing or shortness of breath. 16 g 0   AMBULATORY NON FORMULARY MEDICATION Medication Name: GI Cocktail-270 ml viscous Xylocaine 2% with 270 ml Dicyclomine 10mg /5 ml and 810 ml Mylanta SIG take 8-10 ml PO every 4-6 hours PRN severe stomach pain 1350 mL 0   ASHWAGANDHA PO Take 2 capsules by mouth 2 (two) times daily as needed (anxiety). (Patient not taking: Reported on 11/29/2022)     cyclobenzaprine (FLEXERIL) 10 MG tablet TAKE 1 TABLET BY MOUTH AT BEDTIME AS NEEDED FOR MUSCLE SPASMS (Patient not taking: Reported on 11/29/2022) 30 tablet 1   DULoxetine (CYMBALTA) 60 MG capsule Take 1 capsule (60 mg total) by mouth daily. 90 capsule 1   esomeprazole (NEXIUM) 40 MG capsule Take 1 capsule (40 mg total) by mouth in the morning and at bedtime. 60 capsule 5   fluticasone (FLONASE) 50 MCG/ACT nasal spray Place 2 sprays into both nostrils daily. (Patient taking differently: Place 2 sprays into both nostrils daily as needed for allergies.) 16 g 0   gabapentin (NEURONTIN) 400 MG capsule Take 400 mg by mouth 3 (three) times daily.     hydrOXYzine (ATARAX) 25 MG tablet TAKE 1 TABLET(25 MG) BY MOUTH THREE TIMES DAILY 270 tablet 1   lidocaine (LIDODERM) 5 % Place 1 patch onto the skin daily. 30 patch 3   linaclotide (LINZESS) 145 MCG CAPS capsule Take 1 capsule (145 mcg total) by mouth daily before breakfast. 30 capsule 5   methimazole (TAPAZOLE) 5 MG tablet Take 1 tablet (5 mg total) by mouth as directed. 1 tablet Monday through Friday, skip  Saturdays and  Sundays 60 tablet 3   methocarbamol (ROBAXIN) 500 MG tablet Take 1 tablet (500 mg total) by mouth 2 (two) times daily. 20 tablet 0   metoprolol succinate (TOPROL-XL) 25 MG 24 hr tablet Take 0.5 tablets (12.5 mg total) by mouth daily. For increased heart rate 45 tablet 1   naproxen (NAPROSYN) 500 MG tablet TAKE 1 TABLET BY MOUTH 2 TIMES DAILY AS NEEDED FOR PAIN. 60 tablet 1   ondansetron (ZOFRAN-ODT) 4 MG  disintegrating tablet Take 1 tablet (4 mg total) by mouth every 8 (eight) hours as needed for nausea or vomiting. 20 tablet 0   oxyCODONE (OXY IR/ROXICODONE) 5 MG immediate release tablet Take 1 tablet (5 mg total) by mouth every 6 (six) hours as needed for moderate pain, severe pain or breakthrough pain. 25 tablet 0   Phenylephrine-Acetaminophen (TYLENOL SINUS+HEADACHE PO) Take 2 tablets by mouth daily as needed (allergies).     predniSONE (DELTASONE) 20 MG tablet Take 1 tablet (20 mg total) by mouth daily with breakfast. 5 tablet 0   No current facility-administered medications for this visit.    PHYSICAL EXAMINATION: ECOG PERFORMANCE STATUS: {CHL ONC ECOG PS:[phone]}  There were no vitals filed for this visit. There were no vitals filed for this visit.  BREAST:*** No palpable masses or nodules in either right or left breasts. No palpable axillary supraclavicular or infraclavicular adenopathy no breast tenderness or nipple discharge. (exam performed in the presence of a chaperone)  LABORATORY DATA:  I have reviewed the data as listed    Latest Ref Rng & Units 11/25/2022    9:53 PM 11/04/2022    5:52 AM 10/11/2022   12:49 PM  CMP  Glucose 70 - 99 mg/dL 161  096  045   BUN 6 - 20 mg/dL 9  8  10    Creatinine 0.44 - 1.00 mg/dL 4.09  8.11  9.14   Sodium 135 - 145 mmol/L 136  137  138   Potassium 3.5 - 5.1 mmol/L 3.4  3.8  3.6   Chloride 98 - 111 mmol/L 104  104  104   CO2 22 - 32 mmol/L 24  22  28    Calcium 8.9 - 10.3 mg/dL 8.9  9.4  9.1   Total Protein 6.5 - 8.1 g/dL   7.4   Total Bilirubin 0.3 - 1.2 mg/dL   0.5   Alkaline Phos 38 - 126 U/L   146   AST 15 - 41 U/L   13   ALT 0 - 44 U/L   11     Lab Results  Component Value Date   WBC 11.7 (H) 11/25/2022   HGB 12.8 11/25/2022   HCT 38.4 11/25/2022   MCV 84.6 11/25/2022   PLT 286 11/25/2022   NEUTROABS 4.1 10/11/2022    ASSESSMENT & PLAN:  No problem-specific Assessment & Plan notes found for this encounter.    No  orders of the defined types were placed in this encounter.  The patient has a good understanding of the overall plan. she agrees with it. she will call with any problems that may develop before the next visit here. Total time spent: 30 mins including face to face time and time spent for planning, charting and co-ordination of care   Sherlyn Lick, CMA 12/09/22    I Laurie Bell am acting as a Neurosurgeon for [employer]  ***

## 2022-12-11 ENCOUNTER — Ambulatory Visit: Payer: 59 | Admitting: Radiation Oncology

## 2022-12-11 ENCOUNTER — Ambulatory Visit: Payer: 59

## 2022-12-12 ENCOUNTER — Encounter: Payer: Self-pay | Admitting: *Deleted

## 2022-12-12 ENCOUNTER — Encounter: Payer: Self-pay | Admitting: Hematology and Oncology

## 2022-12-12 ENCOUNTER — Inpatient Hospital Stay: Payer: 59 | Attending: Hematology and Oncology | Admitting: Hematology and Oncology

## 2022-12-12 VITALS — BP 140/89 | HR 110 | Temp 97.7°F | Resp 18 | Ht 66.0 in | Wt 208.2 lb

## 2022-12-12 DIAGNOSIS — Z171 Estrogen receptor negative status [ER-]: Secondary | ICD-10-CM | POA: Insufficient documentation

## 2022-12-12 DIAGNOSIS — G4700 Insomnia, unspecified: Secondary | ICD-10-CM | POA: Insufficient documentation

## 2022-12-12 DIAGNOSIS — C50411 Malignant neoplasm of upper-outer quadrant of right female breast: Secondary | ICD-10-CM

## 2022-12-12 DIAGNOSIS — Z803 Family history of malignant neoplasm of breast: Secondary | ICD-10-CM | POA: Diagnosis not present

## 2022-12-12 DIAGNOSIS — R530 Neoplastic (malignant) related fatigue: Secondary | ICD-10-CM | POA: Insufficient documentation

## 2022-12-12 DIAGNOSIS — D0511 Intraductal carcinoma in situ of right breast: Secondary | ICD-10-CM | POA: Insufficient documentation

## 2022-12-12 DIAGNOSIS — Z5111 Encounter for antineoplastic chemotherapy: Secondary | ICD-10-CM | POA: Diagnosis not present

## 2022-12-12 DIAGNOSIS — Z5189 Encounter for other specified aftercare: Secondary | ICD-10-CM | POA: Diagnosis not present

## 2022-12-12 DIAGNOSIS — F419 Anxiety disorder, unspecified: Secondary | ICD-10-CM | POA: Diagnosis not present

## 2022-12-12 MED ORDER — ONDANSETRON HCL 8 MG PO TABS
ORAL_TABLET | ORAL | 1 refills | Status: DC
Start: 2022-12-12 — End: 2023-02-21

## 2022-12-12 MED ORDER — PROCHLORPERAZINE MALEATE 10 MG PO TABS
10.0000 mg | ORAL_TABLET | Freq: Four times a day (QID) | ORAL | 1 refills | Status: AC | PRN
Start: 2022-12-12 — End: ?

## 2022-12-12 MED ORDER — LIDOCAINE-PRILOCAINE 2.5-2.5 % EX CREA
TOPICAL_CREAM | CUTANEOUS | 3 refills | Status: DC
Start: 2022-12-12 — End: 2023-01-21

## 2022-12-12 MED ORDER — DEXAMETHASONE 4 MG PO TABS
ORAL_TABLET | ORAL | 0 refills | Status: DC
Start: 2022-12-12 — End: 2023-02-28

## 2022-12-12 NOTE — Research (Signed)
W0981, ICE COMPRESS: RANDOMIZED TRIAL OF LIMB CRYOCOMPRESSION VERSUS CONTINUOUS COMPRESSION VERSUS LOW CYCLIC COMPRESSION FOR THE PREVENTION OF TAXANE-INDUCED PERIPHERAL NEUROPATHY  Patient Laurie Bell was identified by Dr Pamelia Hoit as a potential candidate for the above listed study. This Clinical Research Nurse met with Laurie Bell, [id number], on 12/12/22 in a manner and location that ensures patient privacy to discuss participation in the above listed research study. Patient is Accompanied by her family . A copy of the informed consent document and separate HIPAA Authorization was provided to the patient. Patient reads, speaks, and understands [place].    Patient was provided with the business card of this Nurse and encouraged to contact the research team with any questions. Approximately 10 minutes were spent with the patient reviewing the informed consent documents. Patient was provided the option of taking informed consent documents home to review and was encouraged to review at their convenience with their support network, including other care providers. Patient took the consent documents home to review.  Juanita Laster, RN, BSN, CPN Clinical Research Nurse I [phone]  12/12/2022 2:27 PM

## 2022-12-12 NOTE — Assessment & Plan Note (Signed)
Right lumpectomy: Grade 3 IDC, 2 cm in size inferior and posterior margins are positive, DCIS posterior margin positive, lymphovascular invasion not identified, ER 0%, PR 0%, Ki-67 85%, HER2 0 by IHC Margin excision: Posterior margin: Scattered foci of high-grade DCIS, 0/2 lymph nodes negative, final margins negative  (10/02/2022: Initial biopsy was DCIS ER 30% PR 2%)  Treatment plan: Adjuvant chemotherapy with dose dense Adriamycin and Cytoxan followed by Taxol and carboplatin Adjuvant radiation therapy No role of antiestrogen therapy because she was ER/PR negative  Return to clinic in 2 weeks to start chemotherapy

## 2022-12-12 NOTE — Progress Notes (Signed)
START ON PATHWAY REGIMEN - Breast     Cycles 1 through 4: A cycle is every 14 days:     Doxorubicin      Cyclophosphamide      Pegfilgrastim-[id number]    Cycles 5 through 16: A cycle is every 7 days:     Paclitaxel   **Always confirm dose/schedule in your pharmacy ordering system**  Patient Characteristics: Postoperative without Neoadjuvant Therapy, M0 (Pathologic Staging), Invasive Disease, Adjuvant Therapy, HER2 Negative, ER Negative, Node Negative, pT1a-c, N84mi or pT1c or Higher, pN0 Therapeutic Status: Postoperative without Neoadjuvant Therapy, M0 (Pathologic Staging) AJCC Grade: G3 AJCC N Category: pN0 AJCC M Category: cM0 ER Status: Negative (-) AJCC 8 Stage Grouping: IIA HER2 Status: Negative (-) Oncotype Dx Recurrence Score: Not Appropriate AJCC T Category: pT2 PR Status: Negative (-) Intent of Therapy: Curative Intent, Discussed with Patient

## 2022-12-13 ENCOUNTER — Other Ambulatory Visit: Payer: Self-pay

## 2022-12-15 ENCOUNTER — Telehealth: Payer: Self-pay | Admitting: Hematology and Oncology

## 2022-12-15 ENCOUNTER — Telehealth: Payer: Self-pay

## 2022-12-15 NOTE — Telephone Encounter (Signed)
Z6109, ICE COMPRESS: RANDOMIZED TRIAL OF LIMB CRYOCOMPRESSION VERSUS CONTINUOUS COMPRESSION VERSUS LOW CYCLIC COMPRESSION FOR THE PREVENTION OF TAXANE-INDUCED PERIPHERAL NEUROPATHY  Called and LVM for patient to follow-up on above-listed study. Provided contact information for call-back.   Juanita Laster, RN, BSN, CPN Clinical Research Nurse I [phone]  12/15/2022 10:48 AM

## 2022-12-15 NOTE — Telephone Encounter (Signed)
Scheduled appointments per WQ. Left voicemail for patient.  

## 2022-12-18 ENCOUNTER — Encounter: Payer: Self-pay | Admitting: *Deleted

## 2022-12-19 ENCOUNTER — Emergency Department (HOSPITAL_COMMUNITY): Payer: 59

## 2022-12-19 ENCOUNTER — Other Ambulatory Visit: Payer: Self-pay | Admitting: Internal Medicine

## 2022-12-19 ENCOUNTER — Emergency Department (HOSPITAL_COMMUNITY)
Admission: EM | Admit: 2022-12-19 | Discharge: 2022-12-20 | Disposition: A | Discharge status: Home / Self Care | Payer: 59 | Attending: Emergency Medicine | Admitting: Emergency Medicine

## 2022-12-19 ENCOUNTER — Other Ambulatory Visit: Payer: Self-pay

## 2022-12-19 ENCOUNTER — Encounter (HOSPITAL_COMMUNITY): Payer: Self-pay

## 2022-12-19 DIAGNOSIS — Z853 Personal history of malignant neoplasm of breast: Secondary | ICD-10-CM | POA: Insufficient documentation

## 2022-12-19 DIAGNOSIS — Z91040 Latex allergy status: Secondary | ICD-10-CM | POA: Insufficient documentation

## 2022-12-19 DIAGNOSIS — Z0389 Encounter for observation for other suspected diseases and conditions ruled out: Secondary | ICD-10-CM | POA: Diagnosis not present

## 2022-12-19 DIAGNOSIS — R0981 Nasal congestion: Secondary | ICD-10-CM | POA: Diagnosis not present

## 2022-12-19 DIAGNOSIS — Z20822 Contact with and (suspected) exposure to covid-19: Secondary | ICD-10-CM | POA: Diagnosis not present

## 2022-12-19 DIAGNOSIS — R059 Cough, unspecified: Secondary | ICD-10-CM | POA: Diagnosis not present

## 2022-12-19 DIAGNOSIS — R079 Chest pain, unspecified: Secondary | ICD-10-CM | POA: Diagnosis not present

## 2022-12-19 DIAGNOSIS — R0602 Shortness of breath: Secondary | ICD-10-CM | POA: Diagnosis not present

## 2022-12-19 DIAGNOSIS — J029 Acute pharyngitis, unspecified: Secondary | ICD-10-CM | POA: Diagnosis not present

## 2022-12-19 LAB — CBC WITH DIFFERENTIAL/PLATELET
Abs Immature Granulocytes: 0.04 10*3/uL (ref 0.00–0.07)
Basophils Absolute: 0 10*3/uL (ref 0.0–0.1)
Basophils Relative: 0 %
Eosinophils Absolute: 0 10*3/uL (ref 0.0–0.5)
Eosinophils Relative: 0 %
HCT: 39.7 % (ref 36.0–46.0)
Hemoglobin: 13 g/dL (ref 12.0–15.0)
Immature Granulocytes: 0 %
Lymphocytes Relative: 18 %
Lymphs Abs: 2 10*3/uL (ref 0.7–4.0)
MCH: 27.8 pg (ref 26.0–34.0)
MCHC: 32.7 g/dL (ref 30.0–36.0)
MCV: 84.8 fL (ref 80.0–100.0)
Monocytes Absolute: 0.3 10*3/uL (ref 0.1–1.0)
Monocytes Relative: 3 %
Neutro Abs: 8.5 10*3/uL — ABNORMAL HIGH (ref 1.7–7.7)
Neutrophils Relative %: 79 %
Platelets: 255 10*3/uL (ref 150–400)
RBC: 4.68 MIL/uL (ref 3.87–5.11)
RDW: 13.2 % (ref 11.5–15.5)
WBC: 10.8 10*3/uL — ABNORMAL HIGH (ref 4.0–10.5)
nRBC: 0 % (ref 0.0–0.2)

## 2022-12-19 LAB — COMPREHENSIVE METABOLIC PANEL
ALT: 17 U/L (ref 0–44)
AST: 16 U/L (ref 15–41)
Albumin: 4.1 g/dL (ref 3.5–5.0)
Alkaline Phosphatase: 124 U/L (ref 38–126)
Anion gap: 6 (ref 5–15)
BUN: 11 mg/dL (ref 6–20)
CO2: 23 mmol/L (ref 22–32)
Calcium: 9.2 mg/dL (ref 8.9–10.3)
Chloride: 106 mmol/L (ref 98–111)
Creatinine, Ser: 0.95 mg/dL (ref 0.44–1.00)
GFR, Estimated: >60 mL/min (ref >60)
Glucose, Bld: 128 mg/dL — ABNORMAL HIGH (ref 70–99)
Potassium: 4 mmol/L (ref 3.5–5.1)
Sodium: 135 mmol/L (ref 135–145)
Total Bilirubin: 0.5 mg/dL (ref 0.3–1.2)
Total Protein: 8 g/dL (ref 6.5–8.1)

## 2022-12-19 LAB — SARS CORONAVIRUS 2 BY RT PCR: SARS Coronavirus 2 by RT PCR: NEGATIVE

## 2022-12-19 LAB — TROPONIN I (HIGH SENSITIVITY): Troponin I (High Sensitivity): <2 ng/L (ref <18)

## 2022-12-19 MED ORDER — CALCIUM CARBONATE ANTACID 500 MG PO CHEW
1.0000 | CHEWABLE_TABLET | Freq: Once | ORAL | Status: DC
  Filled 2022-12-19: qty 1

## 2022-12-19 MED ORDER — FAMOTIDINE IN NACL 20-0.9 MG/50ML-% IV SOLN
20.0000 mg | Freq: Once | INTRAVENOUS | Status: AC
  Administered 2022-12-19: 20 mg via INTRAVENOUS
  Filled 2022-12-19: qty 50

## 2022-12-19 MED ORDER — IOHEXOL 350 MG/ML SOLN
80.0000 mL | Freq: Once | INTRAVENOUS | Status: AC | PRN
  Administered 2022-12-19: 80 mL via INTRAVENOUS

## 2022-12-19 MED ORDER — ALUM & MAG HYDROXIDE-SIMETH 200-200-20 MG/5ML PO SUSP
30.0000 mL | Freq: Once | ORAL | Status: AC
  Administered 2022-12-20: 30 mL via ORAL
  Filled 2022-12-19: qty 30

## 2022-12-19 NOTE — ED Triage Notes (Signed)
Patient presented to ER for increased shortness of breath, dry cough, stuffy nose and stuff in throat. Patient has history of breast cancer has had surgery to remove but still getting chemo.

## 2022-12-19 NOTE — ED Notes (Signed)
Pt states that between her acid reflux acting up and her heart racing its a bad combo.

## 2022-12-19 NOTE — ED Notes (Signed)
Labs sent

## 2022-12-19 NOTE — ED Notes (Signed)
Ambulated pt with SpO2, pts HR before standing was 88 and SpO2 was at 98. Once pt stood up HR increased to 128 and remained between 100-118. SpO2 level remained between 97-99%. Asked the pt if they were feeling any pain, pt stated, " yea, it feels like a sharp pain and irritating. It feels like this most days, possibly because I have anxiety and acid reflux."

## 2022-12-19 NOTE — ED Provider Notes (Signed)
[place] EMERGENCY DEPARTMENT AT [facility] Provider Note   CSN: [id number] Arrival date & time: 12/19/22  1809     History  Chief Complaint  Patient presents with   Shortness of Breath    Laurie Bell is a 42 y.o. female.  42 y.o female with a PMH of breast CA s/po surgery but undergoing chemotherapy presents to the ED with a chief complaint of nasal congestion, sore throat, chest pain and shortness of breath.  Reports she feels some left-sided stabbing pain.  Felt that there was some tingling and numbness going from her chest down to her bilateral upper and lower extremities.  She reports taking some sinus medication daily, and feels like she took some today and there was no improvement in symptoms.  Continues to feel congested.  She also reports her heart has been racing, previously seen for an irregular heart rate. She also reports the tingling sensation down upper extremities but has had muscle testing in the past, told this was likely stress. No fever, no cough, or other complaints.   The history is provided by the patient.  Shortness of Breath Severity:  Moderate Onset quality:  Gradual Duration:  1 day Timing:  Constant Associated symptoms: no abdominal pain, no chest pain, no fever and no vomiting        Home Medications Prior to Admission medications   Medication Sig Start Date End Date Taking? Authorizing Provider  doxycycline (VIBRAMYCIN) 100 MG capsule Take 1 capsule (100 mg total) by mouth 2 (two) times daily. 12/20/22  Yes Roxy Horseman, PA-C  ipratropium (ATROVENT) 0.03 % nasal spray Place 2 sprays into both nostrils every 12 (twelve) hours. 12/20/22  Yes Roxy Horseman, PA-C  acetaminophen (TYLENOL) 500 MG tablet Take 1,000 mg by mouth every 6 (six) hours as needed for moderate pain.    [provider]  albuterol (VENTOLIN HFA) 108 (90 Base) MCG/ACT inhaler TAKE 2 PUFFS BY MOUTH EVERY 6 HOURS AS NEEDED FOR WHEEZE OR SHORTNESS OF  BREATH 12/22/22   Hoy Register, MD  AMBULATORY NON FORMULARY MEDICATION Medication Name: GI Cocktail-270 ml viscous Xylocaine 2% with 270 ml Dicyclomine 10mg /5 ml and 810 ml Mylanta SIG take 8-10 ml PO every 4-6 hours PRN severe stomach pain 09/14/22   Imogene Burn, MD  ASHWAGANDHA PO Take 2 capsules by mouth 2 (two) times daily as needed (anxiety). Patient not taking: Reported on 11/29/2022    [provider]  cyclobenzaprine (FLEXERIL) 10 MG tablet TAKE 1 TABLET BY MOUTH AT BEDTIME AS NEEDED FOR MUSCLE SPASMS Patient not taking: Reported on 11/29/2022 10/31/22   Erick Colace, MD  dexamethasone (DECADRON) 4 MG tablet Take 1 tablet day after chemo and 1 tablet 2 days after chemo with food 12/12/22   Serena Croissant, MD  DULoxetine (CYMBALTA) 60 MG capsule Take 1 capsule (60 mg total) by mouth daily. 11/29/22   Hoy Register, MD  esomeprazole (NEXIUM) 40 MG capsule TAKE 1 CAPSULE BY MOUTH EVERY MORNING AND AT BEDTIME 12/19/22   Imogene Burn, MD  fluticasone [facility]) 50 MCG/ACT nasal spray Place 2 sprays into both nostrils daily. Patient taking differently: Place 2 sprays into both nostrils daily as needed for allergies. 12/30/21   Lurline Idol, FNP  gabapentin (NEURONTIN) 400 MG capsule Take 400 mg by mouth 3 (three) times daily.    [provider]  hydrOXYzine (ATARAX) 25 MG tablet TAKE 1 TABLET(25 MG) BY MOUTH THREE TIMES DAILY 11/29/22   Hoy Register, MD  lidocaine (LIDODERM) 5 % Place 1 patch onto the skin daily. 08/26/21   Hoy Register, MD  lidocaine-prilocaine (EMLA) cream Apply to affected area once 12/12/22   Serena Croissant, MD  linaclotide [facility]) 145 MCG CAPS capsule Take 1 capsule (145 mcg total) by mouth daily before breakfast. 05/19/22   Imogene Burn, MD  meloxicam (MOBIC) 7.5 MG tablet Take by mouth. 10/14/22   [provider]  methimazole (TAPAZOLE) 5 MG tablet Take 1 tablet (5 mg total) by mouth as directed. 1 tablet Monday through Friday, skip  Saturdays and  Sundays 09/27/22   Shamleffer, Konrad Dolores, MD  methocarbamol (ROBAXIN) 500 MG tablet Take 1 tablet (500 mg total) by mouth 2 (two) times daily. 11/04/22   Roemhildt, Lorin T, PA-C  metoprolol succinate (TOPROL-XL) 25 MG 24 hr tablet Take 0.5 tablets (12.5 mg total) by mouth daily. For increased heart rate 11/29/22   Newlin, Odette Horns, MD  naproxen (NAPROSYN) 500 MG tablet TAKE 1 TABLET BY MOUTH 2 TIMES DAILY AS NEEDED FOR PAIN. 11/13/22   Hoy Register, MD  ondansetron (ZOFRAN) 8 MG tablet Take 1 tab (8 mg) by mouth every 8 hrs as needed for nausea/vomiting. Start third day after doxorubicin/cyclophosphamide chemotherapy. 12/12/22   Serena Croissant, MD  ondansetron (ZOFRAN-ODT) 4 MG disintegrating tablet Take 1 tablet (4 mg total) by mouth every 8 (eight) hours as needed for nausea or vomiting. 08/18/21   Imogene Burn, MD  oxyCODONE (OXY IR/ROXICODONE) 5 MG immediate release tablet Take 1 tablet (5 mg total) by mouth every 6 (six) hours as needed for moderate pain, severe pain or breakthrough pain. 12/01/22   Abigail Miyamoto, MD  Phenylephrine-Acetaminophen (TYLENOL SINUS+HEADACHE PO) Take 2 tablets by mouth daily as needed (allergies).    [provider]  potassium chloride (KLOR-CON) 10 MEQ tablet Take by mouth. 08/08/22   [provider]  predniSONE (DELTASONE) 20 MG tablet Take 1 tablet (20 mg total) by mouth daily with breakfast. 11/29/22   Hoy Register, MD  prochlorperazine (COMPAZINE) 10 MG tablet Take 1 tablet (10 mg total) by mouth every 6 (six) hours as needed for nausea or vomiting. 12/12/22   Serena Croissant, MD  medroxyPROGESTERone (PROVERA) 10 MG tablet Take 2 tablets by mouth daily Patient not taking: Reported on 06/05/2019 02/06/18 02/02/20  Conan Bowens, MD      Allergies    Latex    Review of Systems   Review of Systems  Constitutional:  Positive for chills. Negative for fever.  Respiratory:  Positive for shortness of breath.   Cardiovascular:   Negative for chest pain.  Gastrointestinal:  Negative for abdominal pain, nausea and vomiting.  Musculoskeletal:  Negative for back pain.  All other systems reviewed and are negative.   Physical Exam Updated Vital Signs BP (!) 141/83 (BP Location: Left Arm)   Pulse 87   Temp 98.2 F (36.8 C) (Oral)   Resp 16   Ht 5\' 6"  (1.676 m)   Wt 94 kg   LMP 11/28/2022 (Exact Date)   SpO2 99%   BMI 33.45 kg/m  Physical Exam Vitals and nursing note reviewed.  Constitutional:      Appearance: She is well-developed.  HENT:     Head: Normocephalic and atraumatic.  Cardiovascular:     Rate and Rhythm: Normal rate.  Pulmonary:     Effort: Pulmonary effort is normal.     Breath sounds: No decreased breath sounds or wheezing.  Chest:     Chest wall: Tenderness present.  Abdominal:     Palpations: Abdomen is soft.     Tenderness: There is no abdominal tenderness.  Musculoskeletal:     Cervical back: Normal range of motion and neck supple.     Right lower leg: No edema.     Left lower leg: No edema.  Skin:    General: Skin is warm and dry.  Neurological:     Mental Status: She is alert and oriented to person, place, and time.     ED Results / Procedures / Treatments   Labs (all labs ordered are listed, but only abnormal results are displayed) Labs Reviewed  CBC WITH DIFFERENTIAL/PLATELET - Abnormal; Notable for the following components:      Result Value   WBC 10.8 (*)    Neutro Abs 8.5 (*)    All other components within normal limits  COMPREHENSIVE METABOLIC PANEL - Abnormal; Notable for the following components:   Glucose, Bld 128 (*)    All other components within normal limits  SARS CORONAVIRUS 2 BY RT PCR  TROPONIN I (HIGH SENSITIVITY)    EKG EKG Interpretation Date/Time:  Tuesday December 19 2022 18:26:40 EDT Ventricular Rate:  97 PR Interval:  158 QRS Duration:  78 QT Interval:  347 QTC Calculation: 441 R Axis:   35  Text Interpretation: Sinus rhythm Anterior  infarct, old Baseline wander in lead(s) V5 No significant change since last tracing Confirmed by Richardean Canal [phone]) on 12/19/2022 9:58:37 PM  Radiology No results found.  Procedures Procedures    Medications Ordered in ED Medications  famotidine (PEPCID) IVPB 20 mg premix (0 mg Intravenous Stopped 12/19/22 2256)  iohexol (OMNIPAQUE) 350 MG/ML injection 80 mL (80 mLs Intravenous Contrast Given 12/19/22 2228)  alum & mag hydroxide-simeth (MAALOX/MYLANTA) 200-200-20 MG/5ML suspension 30 mL (30 mLs Oral Given 12/20/22 0013)  doxycycline (VIBRA-TABS) tablet 100 mg (100 mg Oral Given 12/20/22 0015)    ED Course/ Medical Decision Making/ A&P                             Medical Decision Making Amount and/or Complexity of Data Reviewed Labs: ordered. Radiology: ordered.  Risk OTC drugs. Prescription drug management.    This patient presents to the ED for concern of sob, nasal congestion and bilateral arm tingling, this involves a number of treatment options, and is a complaint that carries with it a high risk of complications and morbidity.  The differential diagnosis includes Viral URI, Covid 19, seasonal allergies versus dissection.    Co morbidities: Discussed in HPI   Brief History:  See HPI.   EMR reviewed including pt PMHx, past surgical history and past visits to ER.   See HPI for more details   Lab Tests:  I ordered and independently interpreted labs.  The pertinent results include:    CBC with slight leukocytosis, hemoglobin is normal. The rest of her labs are pending.    Imaging Studies:  NAD. I personally reviewed all imaging studies and no acute abnormality found. I agree with radiology interpretation. Chest xray without any acute findings.    Cardiac Monitoring:  The patient was maintained on a cardiac monitor.  I personally viewed and interpreted the cardiac monitored which showed an underlying rhythm of: NSR 97 EKG non-ischemic   Medicines  ordered: N/A   Reevaluation:  After the interventions noted above I re-evaluated patient and found that they have :stayed the same   Social Determinants of Health:  The patient's social determinants of health were a factor in the care of this patient  Problem List / ED Course:  Patient presents to the ED with nasal congestion, sore throat, along with bodyaches that began this morning.  Reports taking some congestion medication without any improvement in her symptoms.  She was concerned as she also started to develop some left-sided chest pain, shortness of breath along with bilateral upper extremity tingling.  She reports she has had this worked up in the past, and had some stress test they told her that this was likely nerve pain, however today this return.  She was concerned as she felt that her upper and lower extremities were numb.  She does move all her upper and lower extremities.  During evaluation she is nontoxic-appearing, she is a prior cancer patient and currently undergoing chemotherapy therefore some concern for hypercoagulability.  Vitals are within normal limits, no hypoxia satting at 99%, she is afebrile hypertensive. Will check for COVID-19 as well as high suspicion for viral illness.  Will also obtain CBC, CMP along with troponin. Extensive records reveal a prior visit in the month of June, with similar symptoms such as nasal congestion, shortness of breath along with sore throat.  Does appear that patient did go under lumpectomy, they did remove some sentinel nodes, and now she has a diagnoses of Grade 3 IDC, according to Dr. Ave Filter oncologist note from earlier last month reveals that patient now will need to undergo chemotherapy which she has been receiving.  Does not have any prior history of blood clots, however she will be undergoing chemotherapy at this time. She will CT day.  Will be signed out to incoming provider.   Dispostion:  Patient care signed out to incoming  provider pending the rest of her workup.    Portions of this note were generated with Scientist, clinical (histocompatibility and immunogenetics). Dictation errors may occur despite best attempts at proofreading.   Final Clinical Impression(s) / ED Diagnoses Final diagnoses:  SOB (shortness of breath)    Rx / DC Orders ED Discharge Orders          Ordered    azithromycin (ZITHROMAX) 250 MG tablet  Daily,   Status:  Discontinued        12/20/22 0005    ipratropium (ATROVENT) 0.03 % nasal spray  Every 12 hours        12/20/22 0005    doxycycline (VIBRAMYCIN) 100 MG capsule  2 times daily        12/20/22 0006              Claude Manges, PA-C 12/23/22 1700    Charlynne Pander, MD 12/23/22 2230

## 2022-12-20 MED ORDER — DOXYCYCLINE HYCLATE 100 MG PO TABS
100.0000 mg | ORAL_TABLET | Freq: Once | ORAL | Status: AC
  Administered 2022-12-20: 100 mg via ORAL
  Filled 2022-12-20: qty 1

## 2022-12-20 MED ORDER — DOXYCYCLINE HYCLATE 100 MG PO CAPS: 100.0000 mg | ORAL_CAPSULE | Freq: Two times a day (BID) | ORAL | 0 refills | Status: DC

## 2022-12-20 MED ORDER — IPRATROPIUM BROMIDE 0.03 % NA SOLN: 2.0000 | Freq: Two times a day (BID) | NASAL | 0 refills | Status: DC

## 2022-12-20 MED ORDER — AZITHROMYCIN 250 MG PO TABS: 250.0000 mg | ORAL_TABLET | Freq: Every day | ORAL | 0 refills | Status: DC

## 2022-12-20 NOTE — Discharge Instructions (Signed)
No certain cause of your symptoms was found tonight.  We will treat you for developing sinus infection.  I recommend staying well hydrated.  Please follow-up with your doctor.  Please return to the ER for new or worsening symptoms.

## 2022-12-20 NOTE — ED Provider Notes (Signed)
Patient here with nasal congestion, sore throat, ear fullness, shortness of breath that started earlier today.  She also states that her symptoms are exacerbated by acid reflux.  She states that sometimes she feels palpitations and racing heart.  She also reports history of anxiety.  Patient signed out to me at shift change.  Patient has reassuring labs.  Troponin is less than 2, doubt ACS.  Patient had PE study due to cancer and tachycardia, but this was negative for PE or other acute findings.  I discussed all the results with the patient.  She feels reassured with the workup.  Will trial some Atrovent nasal spray for the nasal congestion.  Question early sinusitis given the facial pressure, ear pain, and congestion, will trial doxycycline.  Recommend PCP follow-up.  Return if symptoms worsen.  Patient is agreeable with this plan.   Roxy Horseman, PA-C 12/20/22 0044    Nira Conn, MD 12/21/22 [phone]

## 2022-12-21 ENCOUNTER — Other Ambulatory Visit: Payer: Self-pay | Admitting: Family Medicine

## 2022-12-21 DIAGNOSIS — F32A Depression, unspecified: Secondary | ICD-10-CM

## 2022-12-22 ENCOUNTER — Other Ambulatory Visit: Payer: Self-pay | Admitting: Family Medicine

## 2022-12-22 ENCOUNTER — Encounter: Payer: Self-pay | Admitting: Internal Medicine

## 2022-12-23 ENCOUNTER — Emergency Department (HOSPITAL_COMMUNITY): Payer: 59

## 2022-12-23 ENCOUNTER — Emergency Department (HOSPITAL_COMMUNITY)
Admission: EM | Admit: 2022-12-23 | Discharge: 2022-12-23 | Disposition: A | Discharge status: Home / Self Care | Payer: 59 | Attending: Emergency Medicine | Admitting: Emergency Medicine

## 2022-12-23 ENCOUNTER — Encounter (HOSPITAL_COMMUNITY): Payer: Self-pay

## 2022-12-23 ENCOUNTER — Other Ambulatory Visit: Payer: Self-pay

## 2022-12-23 DIAGNOSIS — Z91040 Latex allergy status: Secondary | ICD-10-CM | POA: Insufficient documentation

## 2022-12-23 DIAGNOSIS — J0100 Acute maxillary sinusitis, unspecified: Secondary | ICD-10-CM | POA: Insufficient documentation

## 2022-12-23 DIAGNOSIS — Z853 Personal history of malignant neoplasm of breast: Secondary | ICD-10-CM | POA: Diagnosis not present

## 2022-12-23 DIAGNOSIS — R519 Headache, unspecified: Secondary | ICD-10-CM | POA: Diagnosis not present

## 2022-12-23 DIAGNOSIS — Z1152 Encounter for screening for COVID-19: Secondary | ICD-10-CM | POA: Diagnosis not present

## 2022-12-23 DIAGNOSIS — G238 Other specified degenerative diseases of basal ganglia: Secondary | ICD-10-CM | POA: Diagnosis not present

## 2022-12-23 LAB — CBC WITH DIFFERENTIAL/PLATELET
Abs Immature Granulocytes: 0.02 10*3/uL (ref 0.00–0.07)
Basophils Absolute: 0 10*3/uL (ref 0.0–0.1)
Basophils Relative: 0 %
Eosinophils Absolute: 0.1 10*3/uL (ref 0.0–0.5)
Eosinophils Relative: 2 %
HCT: 42.4 % (ref 36.0–46.0)
Hemoglobin: 13.9 g/dL (ref 12.0–15.0)
Immature Granulocytes: 0 %
Lymphocytes Relative: 38 %
Lymphs Abs: 2.8 10*3/uL (ref 0.7–4.0)
MCH: 27.9 pg (ref 26.0–34.0)
MCHC: 32.8 g/dL (ref 30.0–36.0)
MCV: 85.1 fL (ref 80.0–100.0)
Monocytes Absolute: 0.5 10*3/uL (ref 0.1–1.0)
Monocytes Relative: 7 %
Neutro Abs: 3.8 10*3/uL (ref 1.7–7.7)
Neutrophils Relative %: 53 %
Platelets: 266 10*3/uL (ref 150–400)
RBC: 4.98 MIL/uL (ref 3.87–5.11)
RDW: 13.1 % (ref 11.5–15.5)
WBC: 7.3 10*3/uL (ref 4.0–10.5)
nRBC: 0 % (ref 0.0–0.2)

## 2022-12-23 LAB — COMPREHENSIVE METABOLIC PANEL
ALT: 15 U/L (ref 0–44)
AST: 15 U/L (ref 15–41)
Albumin: 4 g/dL (ref 3.5–5.0)
Alkaline Phosphatase: 128 U/L — ABNORMAL HIGH (ref 38–126)
Anion gap: 7 (ref 5–15)
BUN: 10 mg/dL (ref 6–20)
CO2: 25 mmol/L (ref 22–32)
Calcium: 9 mg/dL (ref 8.9–10.3)
Chloride: 104 mmol/L (ref 98–111)
Creatinine, Ser: 0.96 mg/dL (ref 0.44–1.00)
GFR, Estimated: >60 mL/min (ref >60)
Glucose, Bld: 102 mg/dL — ABNORMAL HIGH (ref 70–99)
Potassium: 3.5 mmol/L (ref 3.5–5.1)
Sodium: 136 mmol/L (ref 135–145)
Total Bilirubin: 0.6 mg/dL (ref 0.3–1.2)
Total Protein: 8 g/dL (ref 6.5–8.1)

## 2022-12-23 LAB — URINALYSIS, ROUTINE W REFLEX MICROSCOPIC
Bilirubin Urine: NEGATIVE
Glucose, UA: NEGATIVE mg/dL
Hgb urine dipstick: NEGATIVE
Ketones, ur: NEGATIVE mg/dL
Leukocytes,Ua: NEGATIVE
Nitrite: NEGATIVE
Protein, ur: NEGATIVE mg/dL
Specific Gravity, Urine: 1.009 (ref 1.005–1.030)
pH: 7 (ref 5.0–8.0)

## 2022-12-23 LAB — RESP PANEL BY RT-PCR (RSV, FLU A&B, COVID)  RVPGX2
Influenza A by PCR: NEGATIVE
Influenza B by PCR: NEGATIVE
Resp Syncytial Virus by PCR: NEGATIVE
SARS Coronavirus 2 by RT PCR: NEGATIVE

## 2022-12-23 MED ORDER — DIPHENHYDRAMINE HCL 50 MG/ML IJ SOLN
12.5000 mg | Freq: Once | INTRAMUSCULAR | Status: AC
  Administered 2022-12-23: 12.5 mg via INTRAVENOUS
  Filled 2022-12-23: qty 1

## 2022-12-23 MED ORDER — AMOXICILLIN-POT CLAVULANATE 875-125 MG PO TABS: 1.0000 | ORAL_TABLET | Freq: Two times a day (BID) | ORAL | 0 refills | Status: AC

## 2022-12-23 MED ORDER — SODIUM CHLORIDE 0.9 % IV BOLUS
1000.0000 mL | Freq: Once | INTRAVENOUS | Status: AC
  Administered 2022-12-23: 1000 mL via INTRAVENOUS

## 2022-12-23 MED ORDER — PROCHLORPERAZINE EDISYLATE 10 MG/2ML IJ SOLN
10.0000 mg | Freq: Once | INTRAMUSCULAR | Status: AC
  Administered 2022-12-23: 10 mg via INTRAVENOUS
  Filled 2022-12-23: qty 2

## 2022-12-23 NOTE — ED Provider Notes (Signed)
[place] EMERGENCY DEPARTMENT AT [facility] Provider Note   CSN: [id number] Arrival date & time: 12/23/22  1550     History Chief Complaint  Patient presents with   Headache    Laurie Bell is a 42 y.o. female.  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  Reports that she was recently diagnosed with a sinus infection and started on doxycycline for this.  States that she does not feel the doxycycline has had much improvement in her symptoms.  Still endorsing right-sided frontal and maxillary sinus pain and pressure with some bloody nasal discharge.  Also endorsing current photosensitivity and sound sensitivity.  Denies any unilateral weakness.   Headache      Home Medications Prior to Admission medications   Medication Sig Start Date End Date Taking? Authorizing Provider  amoxicillin-clavulanate (AUGMENTIN) 875-125 MG tablet Take 1 tablet by mouth 2 (two) times daily for 7 days. 12/23/22 12/30/22 Yes Smitty Knudsen, PA-C  acetaminophen (TYLENOL) 500 MG tablet Take 1,000 mg by mouth every 6 (six) hours as needed for moderate pain.    [provider]  albuterol (VENTOLIN HFA) 108 (90 Base) MCG/ACT inhaler TAKE 2 PUFFS BY MOUTH EVERY 6 HOURS AS NEEDED FOR WHEEZE OR SHORTNESS OF BREATH 12/22/22   Hoy Register, MD  AMBULATORY NON FORMULARY MEDICATION Medication Name: GI Cocktail-270 ml viscous Xylocaine 2% with 270 ml Dicyclomine 10mg /5 ml and 810 ml Mylanta SIG take 8-10 ml PO every 4-6 hours PRN severe stomach pain 09/14/22   Imogene Burn, MD  ASHWAGANDHA PO Take 2 capsules by mouth 2 (two) times daily as needed (anxiety). Patient not taking: Reported on 11/29/2022    [provider]  cyclobenzaprine (FLEXERIL) 10 MG tablet TAKE 1 TABLET BY MOUTH AT BEDTIME AS NEEDED FOR MUSCLE  SPASMS Patient not taking: Reported on 11/29/2022 10/31/22   Erick Colace, MD  dexamethasone (DECADRON) 4 MG tablet Take 1 tablet day after chemo and 1 tablet 2 days after chemo with food 12/12/22   Serena Croissant, MD  doxycycline (VIBRAMYCIN) 100 MG capsule Take 1 capsule (100 mg total) by mouth 2 (two) times daily. 12/20/22   Roxy Horseman, PA-C  DULoxetine (CYMBALTA) 60 MG capsule Take 1 capsule (60 mg total) by mouth daily. 11/29/22   Hoy Register, MD  esomeprazole (NEXIUM) 40 MG capsule TAKE 1 CAPSULE BY MOUTH EVERY MORNING AND AT BEDTIME 12/19/22   Imogene Burn, MD  fluticasone [facility] 50 MCG/ACT nasal spray Place 2 sprays into both nostrils daily. Patient taking differently: Place 2 sprays into both nostrils daily as needed for allergies. 12/30/21   Lurline Idol, FNP  gabapentin (NEURONTIN) 400 MG capsule Take 400 mg by mouth 3 (three) times daily.    [provider]  hydrOXYzine (ATARAX) 25 MG tablet TAKE 1 TABLET(25 MG) BY MOUTH THREE TIMES DAILY 11/29/22   Hoy Register, MD  ipratropium (ATROVENT) 0.03 % nasal spray Place 2 sprays into both nostrils every 12 (twelve) hours. 12/20/22   Roxy Horseman, PA-C  lidocaine (LIDODERM) 5 % Place 1 patch onto the skin daily. 08/26/21   Hoy Register, MD  lidocaine-prilocaine (EMLA) cream Apply to affected area once 12/12/22   Serena Croissant, MD  linaclotide [facility]) 145 MCG CAPS capsule Take 1 capsule (145 mcg total) by mouth daily before breakfast. 05/19/22   Imogene Burn, MD  meloxicam (MOBIC) 7.5 MG tablet Take by mouth. 10/14/22   [provider]  methimazole (TAPAZOLE) 5 MG tablet Take 1 tablet (5 mg total) by mouth as directed. 1 tablet Monday through Friday, skip Saturdays and  Sundays 09/27/22   Shamleffer, Konrad Dolores, MD  methocarbamol (ROBAXIN) 500 MG tablet Take 1 tablet (500 mg total) by mouth 2 (two) times daily. 11/04/22   Roemhildt, Lorin T, PA-C  metoprolol succinate (TOPROL-XL) 25 MG 24 hr tablet  Take 0.5 tablets (12.5 mg total) by mouth daily. For increased heart rate 11/29/22   Newlin, Odette Horns, MD  naproxen (NAPROSYN) 500 MG tablet TAKE 1 TABLET BY MOUTH 2 TIMES DAILY AS NEEDED FOR PAIN. 11/13/22   Hoy Register, MD  ondansetron (ZOFRAN) 8 MG tablet Take 1 tab (8 mg) by mouth every 8 hrs as needed for nausea/vomiting. Start third day after doxorubicin/cyclophosphamide chemotherapy. 12/12/22   Serena Croissant, MD  ondansetron (ZOFRAN-ODT) 4 MG disintegrating tablet Take 1 tablet (4 mg total) by mouth every 8 (eight) hours as needed for nausea or vomiting. 08/18/21   Imogene Burn, MD  oxyCODONE (OXY IR/ROXICODONE) 5 MG immediate release tablet Take 1 tablet (5 mg total) by mouth every 6 (six) hours as needed for moderate pain, severe pain or breakthrough pain. 12/01/22   Abigail Miyamoto, MD  Phenylephrine-Acetaminophen (TYLENOL SINUS+HEADACHE PO) Take 2 tablets by mouth daily as needed (allergies).    [provider]  potassium chloride (KLOR-CON) 10 MEQ tablet Take by mouth. 08/08/22   [provider]  predniSONE (DELTASONE) 20 MG tablet Take 1 tablet (20 mg total) by mouth daily with breakfast. 11/29/22   Hoy Register, MD  prochlorperazine (COMPAZINE) 10 MG tablet Take 1 tablet (10 mg total) by mouth every 6 (six) hours as needed for nausea or vomiting. 12/12/22   Serena Croissant, MD  medroxyPROGESTERone (PROVERA) 10 MG tablet Take 2 tablets by mouth daily Patient not taking: Reported on 06/05/2019 02/06/18 02/02/20  Conan Bowens, MD      Allergies    Latex    Review of Systems   Review of Systems  Neurological:  Positive for headaches.  All other systems reviewed and are negative.   Physical Exam Updated Vital Signs BP 112/85   Pulse 85   Temp 98 F (36.7 C) (Oral)   Resp 18   Ht 5\' 6"  (1.676 m)   Wt 88.9 kg   LMP 11/28/2022 (Exact Date)   SpO2 100%   BMI 31.64 kg/m  Physical Exam Vitals and nursing note reviewed.  Constitutional:      General: She is not  in acute distress.    Appearance: She is well-developed.  HENT:     Head: Normocephalic and atraumatic.  Eyes:     Conjunctiva/sclera: Conjunctivae normal.  Cardiovascular:     Rate and Rhythm: Normal rate and regular rhythm.     Heart sounds: No murmur heard. Pulmonary:     Effort: Pulmonary effort is normal. No respiratory distress.     Breath sounds: Normal breath sounds.  Abdominal:     Palpations: Abdomen is soft.     Tenderness: There is no abdominal tenderness.  Musculoskeletal:        General: No swelling.     Cervical back: Neck supple.  Skin:    General: Skin is warm and dry.     Capillary Refill: Capillary refill takes less than 2 seconds.  Neurological:     Mental Status: She is alert.  Cranial Nerves: No cranial nerve deficit.     Comments: CN II-XII intact with no acute deficit noted. No slurred speech, facial droop, impaired cognition, or other concerning findings.  Psychiatric:        Mood and Affect: Mood normal.     ED Results / Procedures / Treatments   Labs (all labs ordered are listed, but only abnormal results are displayed) Labs Reviewed  COMPREHENSIVE METABOLIC PANEL - Abnormal; Notable for the following components:      Result Value   Glucose, Bld 102 (*)    Alkaline Phosphatase 128 (*)    All other components within normal limits  URINALYSIS, ROUTINE W REFLEX MICROSCOPIC - Abnormal; Notable for the following components:   Color, Urine STRAW (*)    All other components within normal limits  RESP PANEL BY RT-PCR (RSV, FLU A&B, COVID)  RVPGX2  CBC WITH DIFFERENTIAL/PLATELET    EKG None  Radiology CT Head Wo Contrast  Result Date: 12/23/2022 CLINICAL DATA:  Headaches with increasing frequency and severity EXAM: CT HEAD WITHOUT CONTRAST TECHNIQUE: Contiguous axial images were obtained from the base of the skull through the vertex without intravenous contrast. RADIATION DOSE REDUCTION: This exam was performed according to the departmental  dose-optimization program which includes automated exposure control, adjustment of the mA and/or kV according to patient size and/or use of iterative reconstruction technique. COMPARISON:  07/24/2015 FINDINGS: Brain: No acute intracranial findings are seen. There are no signs of bleeding within the cranium. Calcifications are seen in basal ganglia on both sides. Cortical sulci are prominent. Vascular: Unremarkable. Skull: No acute findings are seen. Sinuses/Orbits: Unremarkable. Other: None. IMPRESSION: No acute intracranial findings are seen in noncontrast CT brain. Electronically Signed   By: Ernie Avena M.D.   On: 12/23/2022 20:16    Procedures Procedures   Medications Ordered in ED Medications  sodium chloride 0.9 % bolus 1,000 mL (1,000 mLs Intravenous New Bag/Given 12/23/22 2056)  prochlorperazine (COMPAZINE) injection 10 mg (10 mg Intravenous Given 12/23/22 2051)  diphenhydrAMINE (BENADRYL) injection 12.5 mg (12.5 mg Intravenous Given 12/23/22 2052)    ED Course/ Medical Decision Making/ A&P                           Medical Decision Making Amount and/or Complexity of Data Reviewed Labs: ordered. Radiology: ordered.  Risk Prescription drug management.   This patient presents to the ED for concern of headache.  Differential diagnosis includes migraine headache, sinus infection, tension headache, viral URI, pharyngitis   Lab Tests:  I Ordered, and personally interpreted labs.  The pertinent results include: CBC unremarkable, CMP without any acute derangements in electrolytes but elevation in alk phos, UA without evidence of infection, respiratory viral panel negative   Imaging Studies ordered:  I ordered imaging studies including CT head I independently visualized and interpreted imaging which showed no acute intracranial normality I agree with the radiologist interpretation   Medicines ordered and prescription drug management:  I ordered medication including  fluids, Compazine, Benadryl for headache Reevaluation of the patient after these medicines showed that the patient improved I have reviewed the patients home medicines and have made adjustments as needed   Problem List / ED Course:  Patient presents the emergency department complaints of a headache.  Reports has been ongoing for the last few days without significant improvement.  Reports that she was seen in the emergency department recently and was diagnosed with a sinus infection and has been taking  doxycycline for this.  Also noticing some mild nausea but denies any vomiting or diarrhea.  No abdominal pain at this time.  Will evaluate for potential other causes of headache that she is been experiencing with basic lab workup include respiratory viral panel.  Will also order CT imaging of the head for further evaluation of potential cause of symptoms. Labs are largely unremarkable with white blood count downtrending compared to prior CBC.  CMP without significant abnormalities but alk phos is elevated at 128.  UA without signs of infection and respiratory viral panel negative.  Will treat with migraine cocktail. Patient responded well to migraine cocktail with significant improvement in symptoms.  Will switch patient to a course of Augmentin for management of suspected sinus infection given that she reports that she has not had significant improvement with the doxycycline.  Encourage patient to stop taking the doxycycline and continue with only Augmentin.  Patient is agreeable treatment plan verbalized understanding all return precautions.  All questions answered prior to patient discharge.  Final Clinical Impression(s) / ED Diagnoses Final diagnoses:  Bad headache  Acute non-recurrent maxillary sinusitis    Rx / DC Orders ED Discharge Orders          Ordered    amoxicillin-clavulanate (AUGMENTIN) 875-125 MG tablet  2 times daily        12/23/22 2204              Salomon Mast 12/23/22 2209    Charlynne Pander, MD 12/23/22 2229

## 2022-12-23 NOTE — Discharge Instructions (Signed)
You were seen in the emergency department for a headache. Your labs and imaging were largely reassuring today and you had improvement in your headache with a migraine cocktail. I think you would benefit from switching antibiotics from doxycycline to Augmentin for managing your sinus infection. Please take this as prescribed to ensure resolution of your symptoms. If you are worried that your symptoms are worsening, please return to the ER.

## 2022-12-23 NOTE — ED Notes (Signed)
Assumed care of pt, found her alert and oriented in bed.  Pt requested to eat food bedside. Provider gave consent to eat.  Pt slightly agitated about wait but understands once explained.  Headache continues and pt is very concerned.

## 2022-12-23 NOTE — ED Triage Notes (Signed)
Patient has had a headache since yesterday. Said her right ear feels clogged. Sensitive to light and sound. Taking antibiotic for sinus infection now. Feels nauseous.

## 2022-12-24 ENCOUNTER — Telehealth: Payer: 59 | Admitting: Physician Assistant

## 2022-12-24 DIAGNOSIS — R2242 Localized swelling, mass and lump, left lower limb: Secondary | ICD-10-CM | POA: Diagnosis not present
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
## 2022-12-24 NOTE — Patient Instructions (Signed)
[name], thank you for joining Tylene Fantasia Ward, PA-C for today's virtual visit.  While this provider is not your primary care provider (PCP), if your PCP is located in our provider database this encounter information will be shared with them immediately following your visit.   A [facility] MyChart account gives you access to today's visit and all your visits, tests, and labs performed at [facility] " click here if you don't have a [facility] MyChart account or go to [email or web address]  Consent: (Patient) Laurie Bell provided verbal consent for this virtual visit at the beginning of the encounter.  Current Medications:  Current Outpatient Medications:    acetaminophen (TYLENOL) 500 MG tablet, Take 1,000 mg by mouth every 6 (six) hours as needed for moderate pain., Disp: , Rfl:    albuterol (VENTOLIN HFA) 108 (90 Base) MCG/ACT inhaler, TAKE 2 PUFFS BY MOUTH EVERY 6 HOURS AS NEEDED FOR WHEEZE OR SHORTNESS OF BREATH, Disp: 54 each, Rfl: 0   AMBULATORY NON FORMULARY MEDICATION, Medication Name: GI Cocktail-270 ml viscous Xylocaine 2% with 270 ml Dicyclomine 10mg /5 ml and 810 ml Mylanta SIG take 8-10 ml PO every 4-6 hours PRN severe stomach pain, Disp: 1350 mL, Rfl: 0   amoxicillin-clavulanate (AUGMENTIN) 875-125 MG tablet, Take 1 tablet by mouth 2 (two) times daily for 7 days., Disp: 14 tablet, Rfl: 0   ASHWAGANDHA PO, Take 2 capsules by mouth 2 (two) times daily as needed (anxiety). (Patient not taking: Reported on 11/29/2022), Disp: , Rfl:    cyclobenzaprine (FLEXERIL) 10 MG tablet, TAKE 1 TABLET BY MOUTH AT BEDTIME AS NEEDED FOR MUSCLE SPASMS (Patient not taking: Reported on 11/29/2022), Disp: 30 tablet, Rfl: 1   dexamethasone (DECADRON) 4 MG tablet, Take 1 tablet day after chemo and 1 tablet 2 days after chemo with food, Disp: 30 tablet, Rfl: 0   doxycycline (VIBRAMYCIN) 100 MG capsule, Take 1 capsule (100 mg total) by mouth 2 (two) times daily., Disp: 20 capsule,  Rfl: 0   DULoxetine (CYMBALTA) 60 MG capsule, Take 1 capsule (60 mg total) by mouth daily., Disp: 90 capsule, Rfl: 1   esomeprazole (NEXIUM) 40 MG capsule, TAKE 1 CAPSULE BY MOUTH EVERY MORNING AND AT BEDTIME, Disp: 60 capsule, Rfl: 4   fluticasone (FLONASE) 50 MCG/ACT nasal spray, Place 2 sprays into both nostrils daily. (Patient taking differently: Place 2 sprays into both nostrils daily as needed for allergies.), Disp: 16 g, Rfl: 0   gabapentin (NEURONTIN) 400 MG capsule, Take 400 mg by mouth 3 (three) times daily., Disp: , Rfl:    hydrOXYzine (ATARAX) 25 MG tablet, TAKE 1 TABLET(25 MG) BY MOUTH THREE TIMES DAILY, Disp: 270 tablet, Rfl: 1   ipratropium (ATROVENT) 0.03 % nasal spray, Place 2 sprays into both nostrils every 12 (twelve) hours., Disp: 30 mL, Rfl: 0   lidocaine (LIDODERM) 5 %, Place 1 patch onto the skin daily., Disp: 30 patch, Rfl: 3   lidocaine-prilocaine (EMLA) cream, Apply to affected area once, Disp: 30 g, Rfl: 3   linaclotide (LINZESS) 145 MCG CAPS capsule, Take 1 capsule (145 mcg total) by mouth daily before breakfast., Disp: 30 capsule, Rfl: 5   meloxicam (MOBIC) 7.5 MG tablet, Take by mouth., Disp: , Rfl:    methimazole (TAPAZOLE) 5 MG tablet, Take 1 tablet (5 mg total) by mouth as directed. 1 tablet Monday through Friday, skip Saturdays and  Sundays, Disp: 60 tablet, Rfl: 3   methocarbamol (ROBAXIN) 500 MG tablet, Take 1 tablet (500 mg total)  by mouth 2 (two) times daily., Disp: 20 tablet, Rfl: 0   metoprolol succinate (TOPROL-XL) 25 MG 24 hr tablet, Take 0.5 tablets (12.5 mg total) by mouth daily. For increased heart rate, Disp: 45 tablet, Rfl: 1   naproxen (NAPROSYN) 500 MG tablet, TAKE 1 TABLET BY MOUTH 2 TIMES DAILY AS NEEDED FOR PAIN., Disp: 60 tablet, Rfl: 1   ondansetron (ZOFRAN) 8 MG tablet, Take 1 tab (8 mg) by mouth every 8 hrs as needed for nausea/vomiting. Start third day after doxorubicin/cyclophosphamide chemotherapy., Disp: 30 tablet, Rfl: 1   ondansetron  (ZOFRAN-ODT) 4 MG disintegrating tablet, Take 1 tablet (4 mg total) by mouth every 8 (eight) hours as needed for nausea or vomiting., Disp: 20 tablet, Rfl: 0   oxyCODONE (OXY IR/ROXICODONE) 5 MG immediate release tablet, Take 1 tablet (5 mg total) by mouth every 6 (six) hours as needed for moderate pain, severe pain or breakthrough pain., Disp: 25 tablet, Rfl: 0   Phenylephrine-Acetaminophen (TYLENOL SINUS+HEADACHE PO), Take 2 tablets by mouth daily as needed (allergies)., Disp: , Rfl:    potassium chloride (KLOR-CON) 10 MEQ tablet, Take by mouth., Disp: , Rfl:    predniSONE (DELTASONE) 20 MG tablet, Take 1 tablet (20 mg total) by mouth daily with breakfast., Disp: 5 tablet, Rfl: 0   prochlorperazine (COMPAZINE) 10 MG tablet, Take 1 tablet (10 mg total) by mouth every 6 (six) hours as needed for nausea or vomiting., Disp: 30 tablet, Rfl: 1   Medications ordered in this encounter:  No orders of the defined types were placed in this encounter.    *If you need refills on other medications prior to your next appointment, please contact your pharmacy*  Follow-Up: Call back or seek an in-person evaluation if the symptoms worsen or if the condition fails to improve as anticipated.  [facility] [phone]  Other Instructions Recommend ice to the foot, elevation.  If no improvement follow up with your Primary Care Physician.    If you have been instructed to have an in-person evaluation today at a local Urgent Care facility, please use the link below. It will take you to a list of all of our available [facility], including address, phone number and hours of operation. Please do not delay care.  [facility]  If you or a family member do not have a primary care provider, use the link below to schedule a visit and establish care. When you choose a [facility] primary care physician or advanced practice provider, you gain a long-term partner in health. Find a  Primary Care Provider  Learn more about [facility]'s in-office and virtual care options: [facility] Get [facility] Now

## 2022-12-25 NOTE — Progress Notes (Signed)
Pharmacist Chemotherapy Monitoring - Initial Assessment    Anticipated start date: 01/01/23   The following has been reviewed per standard work regarding the patient's treatment regimen: The patient's diagnosis, treatment plan and drug doses, and organ/hematologic function Lab orders and baseline tests specific to treatment regimen  The treatment plan start date, drug sequencing, and pre-medications Prior authorization status  Patient's documented medication list, including drug-drug interaction screen and prescriptions for anti-emetics and supportive care specific to the treatment regimen The drug concentrations, fluid compatibility, administration routes, and timing of the medications to be used The patient's access for treatment and lifetime cumulative dose history, if applicable  The patient's medication allergies and previous infusion related reactions, if applicable   Changes made to treatment plan:  N/A  Follow up needed:  Pending authorization for treatment  ECHO   Janeice Robinson, RPH, 12/25/2022  12:01 PM

## 2022-12-26 ENCOUNTER — Ambulatory Visit (INDEPENDENT_AMBULATORY_CARE_PROVIDER_SITE_OTHER): Payer: 59 | Admitting: Endocrinology

## 2022-12-26 ENCOUNTER — Encounter: Payer: Self-pay | Admitting: Endocrinology

## 2022-12-26 VITALS — BP 136/70 | HR 90 | Ht 66.0 in | Wt 208.0 lb

## 2022-12-26 DIAGNOSIS — M542 Cervicalgia: Secondary | ICD-10-CM

## 2022-12-26 DIAGNOSIS — E0590 Thyrotoxicosis, unspecified without thyrotoxic crisis or storm: Secondary | ICD-10-CM

## 2022-12-26 DIAGNOSIS — R519 Headache, unspecified: Secondary | ICD-10-CM | POA: Diagnosis not present

## 2022-12-26 DIAGNOSIS — M79671 Pain in right foot: Secondary | ICD-10-CM | POA: Diagnosis not present

## 2022-12-26 LAB — TSH: TSH: 1.91 u[IU]/mL (ref 0.35–5.50)

## 2022-12-26 LAB — T3, FREE: T3, Free: 4.3 pg/mL — ABNORMAL HIGH (ref 2.3–4.2)

## 2022-12-26 LAB — T4, FREE: Free T4: 0.65 ng/dL (ref 0.60–1.60)

## 2022-12-26 NOTE — Progress Notes (Signed)
Name: Laurie Bell  MRN/ DOB: [id number], 11/28/1980    Age/ Sex: 42 y.o., female    PCP: Hoy Register, MD   Reason for Endocrinology Evaluation: Low TSH     Date of Initial Endocrinology Evaluation: 12/09/2021    HPI: [name] is a 42 y.o. female with a past medical history of. The patient presented for initial endocrinology clinic visit on 12/09/2021 for evaluation of her low TSH.   Patient has been noted with low TSH since September 2012 with a TSH at 0.129u IU/mL, with normal free T4.  TRAb was undetectable Thyroid ultrasound on 12/23/2021 revealed multinodular goiter with a right superior 1.5 cm nodule meeting FNA criteria  She was started on methimazole due to multiple symptoms as of 11/2021 by her current endocrinologist Subsequently thyroid levels have been normal although free T4 has been low normal   Lab Results  Component Value Date   TSH 1.59 09/26/2022   TSH 0.85 05/25/2022   TSH 0.70 03/22/2022   FREET4 0.65 09/26/2022   FREET4 0.59 (L) 05/25/2022   FREET4 0.74 03/22/2022     Mother and maternal aunt with thyroid disease   SUBJECTIVE:    Today (12/26/22):     Patient was seen today for work in visit because of her complaints of neck pain on the right side for 2 to 3 weeks This is intermittent and not associated with swallowing or movement She was concerned that it may be related to her thyroid nodule   Otherwise she has no new palpitations or other symptoms and no unusual fatigue She has deferred thyroid biopsy because of being managed for her breast cancer and is starting chemotherapy this week  Most recent ultrasound result as of 5/24 was as follows   Nodule labeled 1 in the mid right thyroid lobe (2.0 cm TR 5) appears to demonstrate interval enlargement, and continues to meet criteria for biopsy as previously recommended.   Nodules labeled 2 and 3, both in the inferior right thyroid lobe, demonstrate more conspicuous  features on today's exam, now warranting follow-up ultrasound in 1 year.  HISTORY:  Past Medical History:  Past Medical History:  Diagnosis Date   Anxiety    Arthritis    Breast cancer (HCC)    Chlamydia contact, treated    GERD (gastroesophageal reflux disease)    Hyperthyroidism    Past Surgical History:  Past Surgical History:  Procedure Laterality Date   AXILLARY SENTINEL NODE BIOPSY Right 12/01/2022   Procedure: RIGHT AXILLARY SENTINEL NODE BIOPSY;  Surgeon: Abigail Miyamoto, MD;  Location: [facility] OR;  Service: General;  Laterality: Right;   BREAST BIOPSY Right 10/02/2022   MM RT BREAST BX W LOC DEV 1ST LESION IMAGE BX SPEC STEREO GUIDE 10/02/2022 [facility]   BREAST BIOPSY  11/10/2022   MM RT RADIOACTIVE SEED LOC MAMMO GUIDE 11/10/2022 [facility]   BREAST LUMPECTOMY WITH RADIOACTIVE SEED LOCALIZATION Right 11/13/2022   Procedure: RIGHT BREAST LUMPECTOMY WITH RADIOACTIVE SEED LOCALIZATION;  Surgeon: Abigail Miyamoto, MD;  Location: [facility];  Service: General;  Laterality: Right;   ENDOMETRIAL ABLATION N/A 04/25/2017   Procedure: ENDOMETRIAL ABLATION With NOVASURE;  Surgeon: Adam Phenix, MD;  Location:  SURGERY CENTER;  Service: Gynecology;  Laterality: N/A;   ESOPHAGEAL MANOMETRY N/A 07/27/2021   Procedure: ESOPHAGEAL MANOMETRY (EM);  Surgeon: Imogene Burn, MD;  Location: [facility];  Service: Gastroenterology;  Laterality: N/A;   PORTACATH PLACEMENT N/A 12/01/2022   Procedure:  INSERTION PORT-A-CATH WITH ULTRASOUND GUIDANCE;  Surgeon: Abigail Miyamoto, MD;  Location: [facility] OR;  Service: General;  Laterality: N/A;   RE-EXCISION OF BREAST CANCER,SUPERIOR MARGINS Right 12/01/2022   Procedure: RE-EXCISION OF RIGHT BREAST CANCER;  Surgeon: Abigail Miyamoto, MD;  Location: [facility] OR;  Service: General;  Laterality: Right;   TUBAL LIGATION  2003    Social History:  reports that she has never smoked. She has never used smokeless tobacco. She reports  that she does not currently use drugs after having used the following drugs: Marijuana. She reports that she does not drink alcohol. Family History: family history includes Breast cancer in her maternal grandmother and paternal grandmother; Hypertension in her maternal aunt and mother; Stomach cancer in her maternal grandfather.   HOME MEDICATIONS: Allergies as of 12/26/2022       Reactions   Latex Other (See Comments)   Burn in vaginal area with latex condoms        Medication List        Accurate as of December 26, 2022 11:58 AM. If you have any questions, ask your nurse or doctor.          STOP taking these medications    doxycycline 100 MG capsule Commonly known as: VIBRAMYCIN Stopped by: Reather Littler   predniSONE 20 MG tablet Commonly known as: DELTASONE Stopped by: Reather Littler       TAKE these medications    acetaminophen 500 MG tablet Commonly known as: TYLENOL Take 1,000 mg by mouth every 6 (six) hours as needed for moderate pain.   albuterol 108 (90 Base) MCG/ACT inhaler Commonly known as: VENTOLIN HFA TAKE 2 PUFFS BY MOUTH EVERY 6 HOURS AS NEEDED FOR WHEEZE OR SHORTNESS OF BREATH   AMBULATORY NON FORMULARY MEDICATION Medication Name: GI Cocktail-270 ml viscous Xylocaine 2% with 270 ml Dicyclomine 10mg /5 ml and 810 ml Mylanta SIG take 8-10 ml PO every 4-6 hours PRN severe stomach pain   amoxicillin-clavulanate 875-125 MG tablet Commonly known as: AUGMENTIN Take 1 tablet by mouth 2 (two) times daily for 7 days.   ASHWAGANDHA PO Take 2 capsules by mouth 2 (two) times daily as needed (anxiety).   cyclobenzaprine 10 MG tablet Commonly known as: FLEXERIL TAKE 1 TABLET BY MOUTH AT BEDTIME AS NEEDED FOR MUSCLE SPASMS   dexamethasone 4 MG tablet Commonly known as: DECADRON Take 1 tablet day after chemo and 1 tablet 2 days after chemo with food   DULoxetine 60 MG capsule Commonly known as: Cymbalta Take 1 capsule (60 mg total) by mouth daily.    esomeprazole 40 MG capsule Commonly known as: NEXIUM TAKE 1 CAPSULE BY MOUTH EVERY MORNING AND AT BEDTIME   fluticasone 50 MCG/ACT nasal spray Commonly known as: FLONASE Place 2 sprays into both nostrils daily. What changed:  when to take this reasons to take this   gabapentin 400 MG capsule Commonly known as: NEURONTIN Take 400 mg by mouth 3 (three) times daily.   hydrOXYzine 25 MG tablet Commonly known as: ATARAX TAKE 1 TABLET(25 MG) BY MOUTH THREE TIMES DAILY   ipratropium 0.03 % nasal spray Commonly known as: ATROVENT Place 2 sprays into both nostrils every 12 (twelve) hours.   lidocaine 5 % Commonly known as: Lidoderm Place 1 patch onto the skin daily.   lidocaine-prilocaine cream Commonly known as: EMLA Apply to affected area once   linaclotide 145 MCG Caps capsule Commonly known as: Linzess Take 1 capsule (145 mcg total) by mouth daily before breakfast.   meloxicam  7.5 MG tablet Commonly known as: MOBIC Take by mouth.   methimazole 5 MG tablet Commonly known as: TAPAZOLE Take 1 tablet (5 mg total) by mouth as directed. 1 tablet Monday through Friday, skip Saturdays and  Sundays   methocarbamol 500 MG tablet Commonly known as: ROBAXIN Take 1 tablet (500 mg total) by mouth 2 (two) times daily.   metoprolol succinate 25 MG 24 hr tablet Commonly known as: TOPROL-XL Take 0.5 tablets (12.5 mg total) by mouth daily. For increased heart rate   naproxen 500 MG tablet Commonly known as: NAPROSYN TAKE 1 TABLET BY MOUTH 2 TIMES DAILY AS NEEDED FOR PAIN.   ondansetron 4 MG disintegrating tablet Commonly known as: ZOFRAN-ODT Take 1 tablet (4 mg total) by mouth every 8 (eight) hours as needed for nausea or vomiting.   ondansetron 8 MG tablet Commonly known as: Zofran Take 1 tab (8 mg) by mouth every 8 hrs as needed for nausea/vomiting. Start third day after doxorubicin/cyclophosphamide chemotherapy.   oxyCODONE 5 MG immediate release tablet Commonly known as:  Oxy IR/ROXICODONE Take 1 tablet (5 mg total) by mouth every 6 (six) hours as needed for moderate pain, severe pain or breakthrough pain.   potassium chloride 10 MEQ tablet Commonly known as: KLOR-CON Take by mouth.   prochlorperazine 10 MG tablet Commonly known as: COMPAZINE Take 1 tablet (10 mg total) by mouth every 6 (six) hours as needed for nausea or vomiting.   TYLENOL SINUS+HEADACHE PO Take 2 tablets by mouth daily as needed (allergies).          REVIEW OF SYSTEMS:   OBJECTIVE:  VS: BP 136/70 (BP Location: Left Arm, Patient Position: Sitting, Cuff Size: Large)   Pulse 90   Ht 5\' 6"  (1.676 m)   Wt 208 lb (94.3 kg)   LMP 11/28/2022 (Exact Date)   SpO2 97%   BMI 33.57 kg/m    Wt Readings from Last 3 Encounters:  12/26/22 208 lb (94.3 kg)  12/23/22 196 lb (88.9 kg)  12/19/22 207 lb 3.7 oz (94 kg)     EXAM:  Anterior neck exam shows the following  She has tenderness over the lower right sternomastoid area and somewhat in the rest of the sternomastoid  Right lobe of the thyroid is palpable, texture is firm irregular but no distinct nodule felt Left lobe is not palpable No lymphadenopathy in the neck   ASSESSMENT/PLAN/RECOMMENDATIONS:   Subclinical Hyperthyroidism:  -She was started on methimazole in June 2023  Has no symptoms today and will have follow-up labs done   2. Multinodular Goiter:  The thyroid exam appears to be unchanged from before with small goiter and no distant enlargement of any nodule No tenderness over the thyroid itself  Her intermittent neck pain appears to be related to muscle spasm of the sternomastoid for unknown reasons She will discuss this with PCP and also take muscle relaxant as needed   Reather Littler MD

## 2022-12-26 NOTE — Progress Notes (Signed)
Please call to let patient know that overall thyroid levels are okay and she will continue the same dose of methimazole but follow-up with Dr. Deloria Lair

## 2022-12-28 ENCOUNTER — Encounter: Payer: Self-pay | Admitting: Hematology and Oncology

## 2022-12-28 ENCOUNTER — Inpatient Hospital Stay: Payer: 59

## 2022-12-28 ENCOUNTER — Other Ambulatory Visit: Payer: Self-pay

## 2022-12-28 ENCOUNTER — Inpatient Hospital Stay: Payer: 59 | Admitting: Pharmacist

## 2022-12-28 DIAGNOSIS — D0511 Intraductal carcinoma in situ of right breast: Secondary | ICD-10-CM | POA: Diagnosis not present

## 2022-12-28 DIAGNOSIS — F419 Anxiety disorder, unspecified: Secondary | ICD-10-CM | POA: Diagnosis not present

## 2022-12-28 DIAGNOSIS — G4700 Insomnia, unspecified: Secondary | ICD-10-CM | POA: Diagnosis not present

## 2022-12-28 DIAGNOSIS — Z803 Family history of malignant neoplasm of breast: Secondary | ICD-10-CM | POA: Diagnosis not present

## 2022-12-28 DIAGNOSIS — Z5111 Encounter for antineoplastic chemotherapy: Secondary | ICD-10-CM | POA: Diagnosis not present

## 2022-12-28 DIAGNOSIS — R530 Neoplastic (malignant) related fatigue: Secondary | ICD-10-CM | POA: Diagnosis not present

## 2022-12-28 DIAGNOSIS — Z171 Estrogen receptor negative status [ER-]: Secondary | ICD-10-CM | POA: Diagnosis not present

## 2022-12-28 DIAGNOSIS — Z5189 Encounter for other specified aftercare: Secondary | ICD-10-CM | POA: Diagnosis not present

## 2022-12-28 NOTE — Therapy (Signed)
OUTPATIENT PHYSICAL THERAPY BREAST CANCER POST OP FOLLOW UP   Patient Name: Laurie Bell MRN: [id number] DOB:1980/06/27, 42 y.o., female Today's Date: 12/29/2022  END OF SESSION:  PT End of Session - 12/29/22 0902     Visit Number 2    Number of Visits 2    Date for PT Re-Evaluation 12/29/22    PT Start Time 0904    PT Stop Time 0948    PT Time Calculation (min) 44 min    Activity Tolerance Patient tolerated treatment well    Behavior During Therapy WFL for tasks assessed/performed             Past Medical History:  Diagnosis Date   Anxiety    Arthritis    Breast cancer (HCC)    Chlamydia contact, treated    GERD (gastroesophageal reflux disease)    Hyperthyroidism    Past Surgical History:  Procedure Laterality Date   AXILLARY SENTINEL NODE BIOPSY Right 12/01/2022   Procedure: RIGHT AXILLARY SENTINEL NODE BIOPSY;  Surgeon: Abigail Miyamoto, MD;  Location: [facility];  Service: General;  Laterality: Right;   BREAST BIOPSY Right 10/02/2022   MM RT BREAST BX W LOC DEV 1ST LESION IMAGE BX SPEC STEREO GUIDE 10/02/2022 [facility]   BREAST BIOPSY  11/10/2022   MM RT RADIOACTIVE SEED LOC MAMMO GUIDE 11/10/2022 [facility]   BREAST LUMPECTOMY WITH RADIOACTIVE SEED LOCALIZATION Right 11/13/2022   Procedure: RIGHT BREAST LUMPECTOMY WITH RADIOACTIVE SEED LOCALIZATION;  Surgeon: Abigail Miyamoto, MD;  Location: [facility];  Service: General;  Laterality: Right;   ENDOMETRIAL ABLATION N/A 04/25/2017   Procedure: ENDOMETRIAL ABLATION With NOVASURE;  Surgeon: Adam Phenix, MD;  Location: [facility];  Service: Gynecology;  Laterality: N/A;   ESOPHAGEAL MANOMETRY N/A 07/27/2021   Procedure: ESOPHAGEAL MANOMETRY (EM);  Surgeon: Imogene Burn, MD;  Location: [facility];  Service: Gastroenterology;  Laterality: N/A;   PORTACATH PLACEMENT N/A 12/01/2022   Procedure: INSERTION PORT-A-CATH WITH ULTRASOUND GUIDANCE;  Surgeon: Abigail Miyamoto,  MD;  Location: [facility];  Service: General;  Laterality: N/A;   RE-EXCISION OF BREAST CANCER,SUPERIOR MARGINS Right 12/01/2022   Procedure: RE-EXCISION OF RIGHT BREAST CANCER;  Surgeon: Abigail Miyamoto, MD;  Location: [facility] OR;  Service: General;  Laterality: Right;   TUBAL LIGATION  2003   Patient Active Problem List   Diagnosis Date Noted   Malignant neoplasm of upper-outer quadrant of right breast in female, estrogen receptor negative (HCC) 10/09/2022   Multinodular goiter 03/22/2022   Subclinical hyperthyroidism 03/22/2022   MDD (major depressive disorder), recurrent episode, moderate (HCC) 11/03/2021   GAD (generalized anxiety disorder) 11/03/2021   Neck pain 10/28/2021   Cervical high risk HPV (human papillomavirus) test positive 10/17/2021   Irregular heart rhythm 04/15/2021   Numbness on left side 04/15/2021   Dysphagia 03/17/2021   Hypomagnesemia 02/23/2021   Gastroesophageal reflux disease    Hypokalemia 08/24/2020   Atypical chest pain 08/24/2020   Nausea and vomiting 08/24/2020   Hypocalcemia 08/24/2020   DUB (dysfunctional uterine bleeding) 01/12/2017   Fibroid, uterine 01/12/2017    PCP:  Hoy Register, MD   REFERRING PROVIDER: Serena Croissant MD   REFERRING DIAG: Right Breast Cancer  THERAPY DIAG:  Malignant neoplasm of right breast in female, estrogen receptor negative, unspecified site of breast (HCC)  Abnormal posture  Rationale for Evaluation and Treatment: Rehabilitation  ONSET DATE: 10/04/2022  SUBJECTIVE:  SUBJECTIVE STATEMENT: The surgery went well, but the port is irritating. Will start Chemo on Monday. My arm tingles at times and since the port was put in it makes my arm goe numb. I have had those muscles spasm for 2 years. I went to PT for it and had DN too, but it never  went away. I have been doing the exercises and think my ROM is good.  PERTINENT HISTORY:  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 It measures 1 cm and is located in the upper-inner quadrant. It is ER 30%+, PR>1% +, HER 2 unknown and Ki 67 unknown. She had a right lumpectomy performed on 11/13/2022. 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 She also had several positive margins and is now s/p a Right Re-excision with SLNB on 12/01/2022 with 0/2 LN's.    PATIENT GOALS:  Reassess how my recovery is going related to arm function, pain, and swelling.  PAIN:  Are you having pain? No, just tingling in left arm.  PRECAUTIONS: Recent Surgery, right UE Lymphedema risk,   RED FLAGS: None   ACTIVITY LEVEL / LEISURE: I went back to [employer] to work a few days, but not again until I see how chemo goes, walking some when not too hot.   OBJECTIVE:   PATIENT SURVEYS:  QUICK DASH: 20.45  OBSERVATIONS: Incisions healed; mild scabbing still present  POSTURE:  Forward head, rounded shoulders  LYMPHEDEMA ASSESSMENT:   UPPER EXTREMITY AROM/PROM:   A/PROM RIGHT   eval   RIGHT 12/29/2022  Shoulder extension 55 60  Shoulder flexion 150 153  Shoulder abduction 173 175  Shoulder internal rotation 67 65  Shoulder external rotation 110 108                          (Blank rows = not tested)   A/PROM LEFT   eval  Shoulder extension 45  Shoulder flexion 160  Shoulder abduction 180  Shoulder internal rotation 67  Shoulder external rotation 105                          (Blank rows = not tested)   CERVICAL AROM: All within normal limits:      UPPER EXTREMITY STRENGTH: WFL   LYMPHEDEMA ASSESSMENTS:    LANDMARK RIGHT   eval RIGHT 12/29/2022  10 cm proximal to olecranon process 33.3 33.3  Olecranon process 27.5 27.6  10 cm proximal to ulnar styloid process 22.2 22.3  Just proximal to ulnar styloid process 16.4 16.5   Across hand at thumb web space 19.3 19.2  At base of 2nd digit 6.0 6.0  (Blank rows = not tested)   LANDMARK LEFT   eval  10 cm proximal to olecranon process 32.5  Olecranon process 27.9  10 cm proximal to ulnar styloid process 21.3  Just proximal to ulnar styloid process 16.05  Across hand at thumb web space 19.1  At base of 2nd digit 6.4  (Blank rows = not tested)  Surgery type/Date: 11/13/2022 Right lumpectomy, 12/01/2022 Right re-excision with SLNB Number of lymph nodes removed: 0/2 Current/past treatment (chemo, radiation, hormone therapy): Having chemo Other symptoms:  Heaviness/tightness no Pain Yes Pitting edema No Infections No Decreased scar mobility Yes Stemmer sign No  PATIENT EDUCATION:  Education details: ABC class, SOZO screens, HEP:lat stretch, pectoral wall stretch, UT/levator stretch,  Foam pad made for axilla Person educated: Patient Education method: Explanation, Demonstration, and Handouts Education comprehension: verbalized understanding and returned demonstration  HOME EXERCISE PROGRAM: Reviewed previously given post op HEP. Added lat stretch, pectoral stretch, UT and levator stretch  ASSESSMENT:  CLINICAL IMPRESSION: Pt is s/p a right breast lumpectomy on 11/13/2022 with a re-excision on 12/01/2022 for Invasive Ductal Carcinoma with 0/2 LN's and insertion of portacath for chemo for her Triple Negative Cancer. She is doing very well and ROM has returned to WNL. There is no sign of swelling in the arm or right breast. We discussed ABC class and SOZO screens. We also discussed continuing compression bra and exs through duration of radiation.  A foam pad was mad to add comfort to her bra at the axillary region. Pt was advised to call or message [place] with any problems or concerns.  Pt will benefit from skilled therapeutic intervention to improve on the following deficits: Decreased knowledge of precautions, impaired UE functional use, pain, decreased ROM, postural  dysfunction.   PT treatment/interventions: ADL/Self care home management, Therapeutic exercises, Patient/Family education, and Self Care   GOALS: Goals reviewed with patient? Yes  LONG TERM GOALS:  (STG=LTG)  GOALS Name Target Date  Goal status  1 Pt will demonstrate she has regained full shoulder ROM and function post operatively compared to baselines.  Baseline: MET INITIAL     PLAN:  PT FREQUENCY/DURATION: No further PT needs identified   PLAN FOR NEXT SESSION: Pt is discharged to HEP. She was advised to call with questions or concerns PHYSICAL THERAPY DISCHARGE SUMMARY  Visits from Start of Care: 2  Current functional level related to goals / functional outcomes: Achieved goals   Remaining deficits: Pt is still pending chemo and radiation   Education / Equipment: HEP instruction   Patient agrees to discharge. Patient goals were met. Patient is being discharged due to meeting the stated rehab goals.   [facility]  [address], [address]  [place] [address]  [phone]  After Breast Cancer Class It is recommended you attend the ABC class to be educated on lymphedema risk reduction. This class is free of charge and lasts for 1 hour. It is a 1-time class. You will need to download the TEAMS app either on your phone or computer. We will send you a link the night before or the morning of the class. You should be able to click on that link to join the class. This is not a confidential class. You don't have to turn your camera on, but other participants may be able to see your email address.  Scar massage You can begin gentle scar massage to you incision sites. Gently place one hand on the incision and move the skin (without sliding on the skin) in various directions. Do this for a few minutes and then you can gently massage either coconut oil or vitamin E cream into the scars.  Compression garment You should continue wearing your compression  bra until you feel like you no longer have swelling.  Home exercise Program Continue doing the exercises you were given until you feel like you can do them without feeling any tightness at the end.   Walking Program Studies show that 30 minutes of walking per day (fast enough to elevate your heart rate) can significantly reduce the risk of a cancer recurrence. If you can't walk due to other medical reasons, we encourage you to find another activity you could do (like  a stationary bike or water exercise).  Posture After breast cancer surgery, people frequently sit with rounded shoulders posture because it puts their incisions on slack and feels better. If you sit like this and scar tissue forms in that position, you can become very tight and have pain sitting or standing with good posture. Try to be aware of your posture and sit and stand up tall to heal properly.  Follow up PT: It is recommended you return every 3 months for the first 3 years following surgery to be assessed on the SOZO machine for an L-Dex score. This helps prevent clinically significant lymphedema in 95% of patients. These follow up screens are 10 minute appointments that you are not billed for.  Waynette Buttery, PT 12/29/2022, 9:49 AM

## 2022-12-28 NOTE — Progress Notes (Signed)
[facility]       Telephone: [phone]?Fax: [phone]   Oncology Clinical Pharmacist Practitioner Initial Assessment  Laurie Bell is a 42 y.o. female with a diagnosis of breast cancer. They were contacted today via in-person visit.  Indication/Regimen Doxorubicin (Adriamycin) and cyclophosphamide (Cytoxan) followed by paclitaxel (Taxol) are being used appropriately for treatment of breast cancer by Dr. Serena Bell.      Wt Readings from Last 1 Encounters:  12/26/22 208 lb (94.3 kg)    Estimated body surface area is 2.1 meters squared as calculated from the following:   Height as of 12/26/22: 5\' 6"  (1.676 m).   Weight as of 12/26/22: 208 lb (94.3 kg).  The dosing regimen is every 14 days for 4 cycles  Doxorubicin (60 mg/m2) on Day 1 Cyclophosphamide (600 mg/m2) on Day 1 Pegfilgrastim (6 mg) on Day 3  Followed by a dosing regimen that is every 7 days for 12 cycles  Paclitaxel (80 mg/m2) on Day 1  It is planned to continue until treatment plan completion or unacceptable toxicity. The tentative start date is: 01/01/23  Dose Modifications No chemotherapy dose modifications at this time   Allergies Allergies  Allergen Reactions   Latex Other (See Comments)    Burn in vaginal area with latex condoms    Vitals: No vitals or labs were done today for this chemotherapy education visit   Contraindications Contraindications were reviewed? Yes Contraindications to therapy were identified? No   Safety Precautions The following safety precautions were reviewed:  Fever: reviewed the importance of having a thermometer and the Centers for Disease Control and Prevention (CDC) definition of fever which is 100.27F (38C) or higher. Patient should call 24/7 triage at [phone] if experiencing a fever or any other symptoms Decreased white blood cells (WBCs) and increased risk for infection Decreased platelet count and increased risk of  bleeding Decreased hemoglobin, part of the red blood cells that carry iron and oxygen Change in the color of urine Fatigue Hair loss Nausea or vomiting Mouth irritation or sores Doxorubicin (vesicant) Cardiotoxicity Hemorrhagic cystitis Secondary cancers Muscle or joint pain or weakness Peripheral neuropathy Hypersensitivity reactions Avoid grapefruit products Intimacy, sexual activity, contraception, fertility Handling body fluids and waste Laurie Bell has some baseline anxiety. May consider adding antixiolytic to premedications if needed.  Medication Reconciliation Current Outpatient Medications  Medication Sig Dispense Refill   albuterol (VENTOLIN HFA) 108 (90 Base) MCG/ACT inhaler TAKE 2 PUFFS BY MOUTH EVERY 6 HOURS AS NEEDED FOR WHEEZE OR SHORTNESS OF BREATH 54 each 0   AMBULATORY NON FORMULARY MEDICATION Medication Name: GI Cocktail-270 ml viscous Xylocaine 2% with 270 ml Dicyclomine 10mg /5 ml and 810 ml Mylanta SIG take 8-10 ml PO every 4-6 hours PRN severe stomach pain 1350 mL 0   amoxicillin-clavulanate (AUGMENTIN) 875-125 MG tablet Take 1 tablet by mouth 2 (two) times daily for 7 days. 14 tablet 0   cyclobenzaprine (FLEXERIL) 10 MG tablet TAKE 1 TABLET BY MOUTH AT BEDTIME AS NEEDED FOR MUSCLE SPASMS 30 tablet 1   esomeprazole (NEXIUM) 40 MG capsule TAKE 1 CAPSULE BY MOUTH EVERY MORNING AND AT BEDTIME 60 capsule 4   hydrOXYzine (ATARAX) 25 MG tablet TAKE 1 TABLET(25 MG) BY MOUTH THREE TIMES DAILY 270 tablet 1   ipratropium (ATROVENT) 0.03 % nasal spray Place 2 sprays into both nostrils every 12 (twelve) hours. 30 mL 0   methimazole (TAPAZOLE) 5 MG tablet Take 1 tablet (5 mg total) by mouth as directed. 1  tablet Monday through Friday, skip Saturdays and  Sundays 60 tablet 3   potassium chloride (KLOR-CON) 10 MEQ tablet Take by mouth.     acetaminophen (TYLENOL) 500 MG tablet Take 1,000 mg by mouth every 6 (six) hours as needed for moderate pain. (Patient not taking: Reported  on 12/28/2022)     dexamethasone (DECADRON) 4 MG tablet Take 1 tablet day after chemo and 1 tablet 2 days after chemo with food (Patient not taking: Reported on 12/28/2022) 30 tablet 0   DULoxetine (CYMBALTA) 60 MG capsule Take 1 capsule (60 mg total) by mouth daily. (Patient not taking: Reported on 12/28/2022) 90 capsule 1   fluticasone (FLONASE) 50 MCG/ACT nasal spray Place 2 sprays into both nostrils daily. (Patient not taking: Reported on 12/28/2022) 16 g 0   gabapentin (NEURONTIN) 400 MG capsule Take 400 mg by mouth 3 (three) times daily. (Patient not taking: Reported on 12/28/2022)     lidocaine (LIDODERM) 5 % Place 1 patch onto the skin daily. (Patient not taking: Reported on 12/28/2022) 30 patch 3   lidocaine-prilocaine (EMLA) cream Apply to affected area once (Patient not taking: Reported on 12/28/2022) 30 g 3   linaclotide (LINZESS) 145 MCG CAPS capsule Take 1 capsule (145 mcg total) by mouth daily before breakfast. (Patient not taking: Reported on 12/28/2022) 30 capsule 5   meloxicam (MOBIC) 7.5 MG tablet Take by mouth. (Patient not taking: Reported on 12/28/2022)     metoprolol succinate (TOPROL-XL) 25 MG 24 hr tablet Take 0.5 tablets (12.5 mg total) by mouth daily. For increased heart rate (Patient not taking: Reported on 12/28/2022) 45 tablet 1   naproxen (NAPROSYN) 500 MG tablet TAKE 1 TABLET BY MOUTH 2 TIMES DAILY AS NEEDED FOR PAIN. (Patient not taking: Reported on 12/28/2022) 60 tablet 1   ondansetron (ZOFRAN) 8 MG tablet Take 1 tab (8 mg) by mouth every 8 hrs as needed for nausea/vomiting. Start third day after doxorubicin/cyclophosphamide chemotherapy. (Patient not taking: Reported on 12/28/2022) 30 tablet 1   prochlorperazine (COMPAZINE) 10 MG tablet Take 1 tablet (10 mg total) by mouth every 6 (six) hours as needed for nausea or vomiting. (Patient not taking: Reported on 12/26/2022) 30 tablet 1   No current facility-administered medications for this visit.   Medication reconciliation is  based on the patient's most recent medication list in the electronic medical record (EMR) including herbal products and OTC medications.   The patient's medication list was reviewed today with the patient? Yes   Drug-drug interactions (DDIs) DDIs were evaluated? Yes Significant DDIs identified? No   Drug-Food Interactions Drug-food interactions were evaluated? Yes Drug-food interactions identified?  She will avoid grapefruit products  Follow-up Plan  Treatment start date: 7/224/24 Port placement date: 12/01/22 ECHO date: 12/29/22 Patient had tubal ligation in 2003 per surgical history and her report We reviewed the prescriptions, premedications, and treatment regimen with the patient. Possible side effects of the treatment regimen were reviewed and management strategies were discussed.  Can use loperamide as needed for diarrhea, loratadine as needed for G-CSF bone pain, and Senna-S as needed for constipation.  Clinical pharmacy will assist Dr. Serena Bell and [name] on an as needed basis going forward  [name] participated in the discussion, expressed understanding, and voiced agreement with the above plan. All questions were answered to her satisfaction. The patient was advised to contact the clinic at [phone] with any questions or concerns prior to her return visit.   I spent 60 minutes assessing the  patient.  Reather Steller A. Odetta Pink, PharmD, BCOP, CPP  Anselm Lis, RPH-CPP, 12/28/2022 3:13 PM  **Disclaimer: This note was dictated with voice recognition software. Similar sounding words can inadvertently be transcribed and this note may contain transcription errors which may not have been corrected upon publication of note.**

## 2022-12-29 ENCOUNTER — Encounter: Payer: Self-pay | Admitting: Hematology and Oncology

## 2022-12-29 ENCOUNTER — Ambulatory Visit: Payer: 59 | Attending: Hematology and Oncology

## 2022-12-29 ENCOUNTER — Telehealth: Payer: Self-pay | Admitting: Licensed Clinical Social Worker

## 2022-12-29 ENCOUNTER — Ambulatory Visit (HOSPITAL_COMMUNITY)
Admission: RE | Admit: 2022-12-29 | Discharge: 2022-12-29 | Disposition: A | Discharge status: Home / Self Care | Payer: 59 | Source: Ambulatory Visit | Attending: Hematology and Oncology | Admitting: Hematology and Oncology

## 2022-12-29 DIAGNOSIS — C50411 Malignant neoplasm of upper-outer quadrant of right female breast: Secondary | ICD-10-CM | POA: Diagnosis not present

## 2022-12-29 DIAGNOSIS — Z0189 Encounter for other specified special examinations: Secondary | ICD-10-CM

## 2022-12-29 DIAGNOSIS — Z171 Estrogen receptor negative status [ER-]: Secondary | ICD-10-CM

## 2022-12-29 DIAGNOSIS — R293 Abnormal posture: Secondary | ICD-10-CM | POA: Diagnosis not present

## 2022-12-29 DIAGNOSIS — C50911 Malignant neoplasm of unspecified site of right female breast: Secondary | ICD-10-CM | POA: Diagnosis not present

## 2022-12-29 DIAGNOSIS — Z5111 Encounter for antineoplastic chemotherapy: Secondary | ICD-10-CM | POA: Insufficient documentation

## 2022-12-29 LAB — ECHOCARDIOGRAM COMPLETE
AR max vel: 2.82 cm2
AV Area VTI: 2.39 cm2
AV Area mean vel: 2.39 cm2
AV Mean grad: 4 mmHg
AV Peak grad: 6.3 mmHg
Ao pk vel: 1.26 m/s
Area-P 1/2: 3.76 cm2
Calc EF: 63.8 %
MV VTI: 4.01 cm2
S' Lateral: 3 cm
Single Plane A2C EF: 64.7 %
Single Plane A4C EF: 63.7 %

## 2022-12-29 MED FILL — Dexamethasone Sodium Phosphate Inj 100 MG/10ML: INTRAMUSCULAR | Qty: 1 | Status: AC

## 2022-12-29 MED FILL — Fosaprepitant Dimeglumine For IV Infusion 150 MG (Base Eq): INTRAVENOUS | Qty: 5 | Status: AC

## 2022-12-29 NOTE — Patient Instructions (Signed)
     [facility]  [address]  [place] [address]  [phone]  After Breast Cancer Class It is recommended you attend the ABC class to be educated on lymphedema risk reduction. This class is free of charge and lasts for 1 hour. It is a 1-time class. You will need to download the TEAMS app either on your phone or computer. We will send you a link the night before or the morning of the class. You should be able to click on that link to join the class. This is not a confidential class. You don't have to turn your camera on, but other participants may be able to see your email address.  Scar massage You can begin gentle scar massage to you incision sites. Gently place one hand on the incision and move the skin (without sliding on the skin) in various directions. Do this for a few minutes and then you can gently massage either coconut oil or vitamin E cream into the scars.  Compression garment You should continue wearing your compression bra until you feel like you no longer have swelling.  Home exercise Program Continue doing the exercises you were given until you feel like you can do them without feeling any tightness at the end.   Walking Program Studies show that 30 minutes of walking per day (fast enough to elevate your heart rate) can significantly reduce the risk of a cancer recurrence. If you can't walk due to other medical reasons, we encourage you to find another activity you could do (like a stationary bike or water exercise).  Posture After breast cancer surgery, people frequently sit with rounded shoulders posture because it puts their incisions on slack and feels better. If you sit like this and scar tissue forms in that position, you can become very tight and have pain sitting or standing with good posture. Try to be aware of your posture and sit and stand up tall to heal properly.  Follow up PT: It is recommended you return every 3 months for  the first 2 years following surgery to be assessed on the SOZO machine for an L-Dex score. This helps prevent clinically significant lymphedema in 95% of patients. These follow up screens are 10 minute appointments that you are not billed for.

## 2022-12-29 NOTE — Patient Outreach (Signed)
  Care Coordination   12/29/2022 Name: NORI WINEGAR MRN: [id number] DOB: [birth year]   Care Coordination Outreach Attempts:  An unsuccessful telephone outreach was attempted today to offer the patient information about available care coordination services.  Follow Up Plan:  Additional outreach attempts will be made to offer the patient care coordination information and services.   Encounter Outcome:  No Answer   Care Coordination Interventions:  No, not indicated    Jenel Lucks, MSW, LCSW [facility] Care Management [place]  [facility] [email or web address] Phone [phone] 2:20 PM

## 2022-12-29 NOTE — Progress Notes (Signed)
Received voicemail from patient after receiving my card per my request.  Introduced myself as Dance movement psychotherapist and to offer available resources.  Discussed one-time $1000 [employer] grant to assist with personal expenses while going through treatment. Advised what is needed to apply. She will send via email. She will be given grant paperwork to complete on Mon 01/01/23. She will also be given expense sheet in green folder. Advised to contact me at earliest convenience after appointment to discuss expense sheet in detail. Briefly went over expenses.  She has my card to do so and for any additional financial questions or concerns.

## 2022-12-31 NOTE — Progress Notes (Unsigned)
Patient Care Team: Hoy Register, MD as PCP - General (Family Medicine) Corky Crafts, MD as PCP - Cardiology (Cardiology) Glendale Chard, DO as Consulting Physician (Neurology) Abigail Miyamoto, MD as Consulting Physician (General Surgery) Serena Croissant, MD as Consulting Physician (Hematology and Oncology) Lonie Peak, MD as Attending Physician (Radiation Oncology) Pershing Proud, RN as Oncology Nurse Navigator Donnelly Angelica, RN as Oncology Nurse Navigator  DIAGNOSIS: No diagnosis found.  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, ER 30% weak, PR 2%   10/11/2022 Cancer Staging   Staging form: Breast, AJCC 8th Edition - Clinical: Stage 0 (cTis (DCIS), cN0, cM0, G3, ER+, PR+, HER2: Not Assessed) - Signed by Serena Croissant, MD on 10/11/2022 Stage prefix: Initial diagnosis Histologic grading system: 3 grade system   10/11/2022 Genetic Testing   Declined hereditary cancer genetic testing    11/13/2022 Surgery   Right lumpectomy: Grade 3 IDC, 2 cm in size inferior and posterior margins are positive, DCIS posterior margin positive, lymphovascular invasion not identified, ER 0%, PR 0%, Ki-67 85%, HER2 0 by [facility]   12/01/2022 Surgery   Margin excision: Posterior margin: Scattered foci of high-grade DCIS, 0/2 lymph nodes negative   01/01/2023 -  Chemotherapy   Patient is on Treatment Plan : BREAST ADJUVANT DOSE DENSE AC q14d / PACLitaxel q7d       CHIEF COMPLIANT: Cycle 1 Adriamycin and Cytoxan   INTERVAL HISTORY: Laurie Bell is a 42 y.o. female is here because of ER/PR negative. She presents to the clinic for a follow-up.    ALLERGIES:  is allergic to latex.  MEDICATIONS:   Current Outpatient Medications  Medication Sig Dispense Refill   acetaminophen (TYLENOL) 500 MG tablet Take 1,000 mg by mouth every 6 (six) hours as needed for moderate pain. (Patient not taking: Reported on 12/28/2022)     albuterol (VENTOLIN HFA) 108 (90 Base) MCG/ACT inhaler TAKE 2 PUFFS BY MOUTH EVERY 6 HOURS AS NEEDED FOR WHEEZE OR SHORTNESS OF BREATH 54 each 0   AMBULATORY NON FORMULARY MEDICATION Medication Name: GI Cocktail-270 ml viscous Xylocaine 2% with 270 ml Dicyclomine 10mg /5 ml and 810 ml Mylanta SIG take 8-10 ml PO every 4-6 hours PRN severe stomach pain 1350 mL 0   cyclobenzaprine (FLEXERIL) 10 MG tablet TAKE 1 TABLET BY MOUTH AT BEDTIME AS NEEDED FOR MUSCLE SPASMS 30 tablet 1   dexamethasone (DECADRON) 4 MG tablet Take 1 tablet day after chemo and 1 tablet 2 days after chemo with food (Patient not taking: Reported on 12/28/2022) 30 tablet 0   DULoxetine (CYMBALTA) 60 MG capsule Take 1 capsule (60 mg total) by mouth daily. (Patient not taking: Reported on 12/28/2022) 90 capsule 1   esomeprazole (NEXIUM) 40 MG capsule TAKE 1 CAPSULE BY MOUTH EVERY MORNING AND AT BEDTIME 60 capsule 4   fluticasone (FLONASE) 50 MCG/ACT nasal spray Place 2 sprays into both nostrils daily. (Patient not taking: Reported on 12/28/2022) 16 g 0   gabapentin (NEURONTIN) 400 MG capsule Take 400 mg by mouth 3 (three) times daily. (Patient not taking: Reported on 12/28/2022)     hydrOXYzine (ATARAX) 25 MG tablet TAKE 1 TABLET(25 MG) BY MOUTH THREE TIMES DAILY 270 tablet 1   ipratropium (ATROVENT) 0.03 % nasal spray Place 2  sprays into both nostrils every 12 (twelve) hours. 30 mL 0   lidocaine (LIDODERM) 5 % Place 1 patch onto the skin daily. (Patient not taking: Reported on 12/28/2022) 30 patch 3   lidocaine-prilocaine (EMLA) cream Apply to affected area once (Patient not taking: Reported on 12/28/2022) 30 g 3   linaclotide (LINZESS) 145 MCG CAPS capsule Take 1 capsule (145 mcg total) by mouth daily before breakfast.  (Patient not taking: Reported on 12/28/2022) 30 capsule 5   meloxicam (MOBIC) 7.5 MG tablet Take by mouth. (Patient not taking: Reported on 12/28/2022)     methimazole (TAPAZOLE) 5 MG tablet Take 1 tablet (5 mg total) by mouth as directed. 1 tablet Monday through Friday, skip Saturdays and  Sundays 60 tablet 3   metoprolol succinate (TOPROL-XL) 25 MG 24 hr tablet Take 0.5 tablets (12.5 mg total) by mouth daily. For increased heart rate (Patient not taking: Reported on 12/28/2022) 45 tablet 1   naproxen (NAPROSYN) 500 MG tablet TAKE 1 TABLET BY MOUTH 2 TIMES DAILY AS NEEDED FOR PAIN. (Patient not taking: Reported on 12/28/2022) 60 tablet 1   ondansetron (ZOFRAN) 8 MG tablet Take 1 tab (8 mg) by mouth every 8 hrs as needed for nausea/vomiting. Start third day after doxorubicin/cyclophosphamide chemotherapy. (Patient not taking: Reported on 12/28/2022) 30 tablet 1   potassium chloride (KLOR-CON) 10 MEQ tablet Take by mouth.     prochlorperazine (COMPAZINE) 10 MG tablet Take 1 tablet (10 mg total) by mouth every 6 (six) hours as needed for nausea or vomiting. (Patient not taking: Reported on 12/26/2022) 30 tablet 1   No current facility-administered medications for this visit.    PHYSICAL EXAMINATION: ECOG PERFORMANCE STATUS: {CHL ONC ECOG [phone]}  There were no vitals filed for this visit. There were no vitals filed for this visit.  BREAST:*** No palpable masses or nodules in either right or left breasts. No palpable axillary supraclavicular or infraclavicular adenopathy no breast tenderness or nipple discharge. (exam performed in the presence of a chaperone)  LABORATORY DATA:  I have reviewed the data as listed    Latest Ref Rng & Units 12/23/2022    4:28 PM 12/19/2022    8:52 PM 11/25/2022    9:53 PM  CMP  Glucose 70 - 99 mg/dL 161  096  045   BUN 6 - 20 mg/dL 10  11  9    Creatinine 0.44 - 1.00 mg/dL 4.09  8.11  9.14   Sodium 135 - 145 mmol/L 136  135  136   Potassium 3.5 - 5.1 mmol/L 3.5   4.0  3.4   Chloride 98 - 111 mmol/L 104  106  104   CO2 22 - 32 mmol/L 25  23  24    Calcium 8.9 - 10.3 mg/dL 9.0  9.2  8.9   Total Protein 6.5 - 8.1 g/dL 8.0  8.0    Total Bilirubin 0.3 - 1.2 mg/dL 0.6  0.5    Alkaline Phos 38 - 126 U/L 128  124    AST 15 - 41 U/L 15  16    ALT 0 - 44 U/L 15  17      Lab Results  Component Value Date   WBC 7.3 12/23/2022   HGB 13.9 12/23/2022   HCT 42.4 12/23/2022   MCV 85.1 12/23/2022   PLT 266 12/23/2022   NEUTROABS 3.8 12/23/2022    ASSESSMENT & PLAN:  No problem-specific Assessment & Plan notes found for this encounter.    No orders of  the defined types were placed in this encounter.  The patient has a good understanding of the overall plan. she agrees with it. she will call with any problems that may develop before the next visit here. Total time spent: 30 mins including face to face time and time spent for planning, charting and co-ordination of care   Sherlyn Lick, CMA 12/31/22    I Janan Ridge am acting as a Neurosurgeon for [employer]  ***

## 2023-01-01 ENCOUNTER — Inpatient Hospital Stay: Payer: 59

## 2023-01-01 ENCOUNTER — Encounter: Payer: Self-pay | Admitting: *Deleted

## 2023-01-01 ENCOUNTER — Other Ambulatory Visit: Payer: Self-pay

## 2023-01-01 ENCOUNTER — Inpatient Hospital Stay (HOSPITAL_BASED_OUTPATIENT_CLINIC_OR_DEPARTMENT_OTHER): Payer: 59 | Admitting: Hematology and Oncology

## 2023-01-01 ENCOUNTER — Other Ambulatory Visit: Payer: 59

## 2023-01-01 VITALS — BP 127/87 | HR 95 | Temp 97.7°F | Resp 18 | Ht 66.0 in | Wt 210.9 lb

## 2023-01-01 VITALS — BP 133/83 | HR 104 | Resp 16

## 2023-01-01 DIAGNOSIS — D0511 Intraductal carcinoma in situ of right breast: Secondary | ICD-10-CM | POA: Diagnosis not present

## 2023-01-01 DIAGNOSIS — Z171 Estrogen receptor negative status [ER-]: Secondary | ICD-10-CM | POA: Diagnosis not present

## 2023-01-01 DIAGNOSIS — C50411 Malignant neoplasm of upper-outer quadrant of right female breast: Secondary | ICD-10-CM

## 2023-01-01 DIAGNOSIS — Z95828 Presence of other vascular implants and grafts: Secondary | ICD-10-CM | POA: Insufficient documentation

## 2023-01-01 DIAGNOSIS — G4700 Insomnia, unspecified: Secondary | ICD-10-CM | POA: Diagnosis not present

## 2023-01-01 DIAGNOSIS — F419 Anxiety disorder, unspecified: Secondary | ICD-10-CM | POA: Diagnosis not present

## 2023-01-01 DIAGNOSIS — Z5189 Encounter for other specified aftercare: Secondary | ICD-10-CM | POA: Diagnosis not present

## 2023-01-01 DIAGNOSIS — Z803 Family history of malignant neoplasm of breast: Secondary | ICD-10-CM | POA: Diagnosis not present

## 2023-01-01 DIAGNOSIS — Z5111 Encounter for antineoplastic chemotherapy: Secondary | ICD-10-CM | POA: Diagnosis not present

## 2023-01-01 DIAGNOSIS — R530 Neoplastic (malignant) related fatigue: Secondary | ICD-10-CM | POA: Diagnosis not present

## 2023-01-01 LAB — CMP (CANCER CENTER ONLY)
ALT: 11 U/L (ref 0–44)
AST: 14 U/L — ABNORMAL LOW (ref 15–41)
Albumin: 4 g/dL (ref 3.5–5.0)
Alkaline Phosphatase: 129 U/L — ABNORMAL HIGH (ref 38–126)
Anion gap: 7 (ref 5–15)
BUN: 6 mg/dL (ref 6–20)
CO2: 25 mmol/L (ref 22–32)
Calcium: 9.3 mg/dL (ref 8.9–10.3)
Chloride: 106 mmol/L (ref 98–111)
Creatinine: 0.71 mg/dL (ref 0.44–1.00)
GFR, Estimated: >60 mL/min (ref >60)
Glucose, Bld: 85 mg/dL (ref 70–99)
Potassium: 4 mmol/L (ref 3.5–5.1)
Sodium: 138 mmol/L (ref 135–145)
Total Bilirubin: 0.3 mg/dL (ref 0.3–1.2)
Total Protein: 7 g/dL (ref 6.5–8.1)

## 2023-01-01 LAB — CBC WITH DIFFERENTIAL (CANCER CENTER ONLY)
Abs Immature Granulocytes: 0.05 10*3/uL (ref 0.00–0.07)
Basophils Absolute: 0.1 10*3/uL (ref 0.0–0.1)
Basophils Relative: 1 %
Eosinophils Absolute: 0.1 10*3/uL (ref 0.0–0.5)
Eosinophils Relative: 1 %
HCT: 36.4 % (ref 36.0–46.0)
Hemoglobin: 12.8 g/dL (ref 12.0–15.0)
Immature Granulocytes: 1 %
Lymphocytes Relative: 35 %
Lymphs Abs: 3.2 10*3/uL (ref 0.7–4.0)
MCH: 29.1 pg (ref 26.0–34.0)
MCHC: 35.2 g/dL (ref 30.0–36.0)
MCV: 82.7 fL (ref 80.0–100.0)
Monocytes Absolute: 0.6 10*3/uL (ref 0.1–1.0)
Monocytes Relative: 6 %
Neutro Abs: 5.3 10*3/uL (ref 1.7–7.7)
Neutrophils Relative %: 56 %
Platelet Count: 263 10*3/uL (ref 150–400)
RBC: 4.4 MIL/uL (ref 3.87–5.11)
RDW: 13 % (ref 11.5–15.5)
WBC Count: 9.4 10*3/uL (ref 4.0–10.5)
nRBC: 0 % (ref 0.0–0.2)

## 2023-01-01 LAB — PREGNANCY, URINE: Preg Test, Ur: NEGATIVE

## 2023-01-01 MED ORDER — SODIUM CHLORIDE 0.9% FLUSH
10.0000 mL | INTRAVENOUS | Status: DC | PRN
  Administered 2023-01-01: 10 mL

## 2023-01-01 MED ORDER — SODIUM CHLORIDE 0.9 % IV SOLN
600.0000 mg/m2 | Freq: Once | INTRAVENOUS | Status: AC
  Administered 2023-01-01: 1260 mg via INTRAVENOUS
  Filled 2023-01-01: qty 63

## 2023-01-01 MED ORDER — SODIUM CHLORIDE 0.9 % IV SOLN
150.0000 mg | Freq: Once | INTRAVENOUS | Status: AC
  Administered 2023-01-01: 150 mg via INTRAVENOUS
  Filled 2023-01-01: qty 150

## 2023-01-01 MED ORDER — HEPARIN SOD (PORK) LOCK FLUSH 100 UNIT/ML IV SOLN
500.0000 [IU] | Freq: Once | INTRAVENOUS | Status: AC | PRN
  Administered 2023-01-01: 500 [IU]

## 2023-01-01 MED ORDER — SODIUM CHLORIDE 0.9% FLUSH
10.0000 mL | Freq: Once | INTRAVENOUS | Status: AC
  Administered 2023-01-01: 10 mL

## 2023-01-01 MED ORDER — DOXORUBICIN HCL CHEMO IV INJECTION 2 MG/ML
60.0000 mg/m2 | Freq: Once | INTRAVENOUS | Status: AC
  Administered 2023-01-01: 126 mg via INTRAVENOUS
  Filled 2023-01-01: qty 63

## 2023-01-01 MED ORDER — SODIUM CHLORIDE 0.9 % IV SOLN: Freq: Once | INTRAVENOUS | Status: AC

## 2023-01-01 MED ORDER — SODIUM CHLORIDE 0.9 % IV SOLN
10.0000 mg | Freq: Once | INTRAVENOUS | Status: AC
  Administered 2023-01-01: 10 mg via INTRAVENOUS
  Filled 2023-01-01: qty 10

## 2023-01-01 MED ORDER — PALONOSETRON HCL INJECTION 0.25 MG/5ML
0.2500 mg | Freq: Once | INTRAVENOUS | Status: AC
  Administered 2023-01-01: 0.25 mg via INTRAVENOUS
  Filled 2023-01-01: qty 5

## 2023-01-01 NOTE — Patient Instructions (Addendum)
[facility]  Discharge Instructions: Thank you for choosing [facility] to provide your oncology and hematology care.   If you have a lab appointment with the Cancer Center, please go directly to the Cancer Center and check in at the registration area.   Wear comfortable clothing and clothing appropriate for easy access to any Portacath or PICC line.   We strive to give you quality time with your provider. You may need to reschedule your appointment if you arrive late (15 or more minutes).  Arriving late affects you and other patients whose appointments are after yours.  Also, if you miss three or more appointments without notifying the office, you may be dismissed from the clinic at the provider's discretion.      For prescription refill requests, have your pharmacy contact our office and allow 72 hours for refills to be completed.    Today you received the following chemotherapy and/or immunotherapy agents: Adriamycin/Cytoxan.      To help prevent nausea and vomiting after your treatment, we encourage you to take your nausea medication as directed.  BELOW ARE SYMPTOMS THAT SHOULD BE REPORTED IMMEDIATELY: *FEVER GREATER THAN 100.4 F (38 C) OR HIGHER *CHILLS OR SWEATING *NAUSEA AND VOMITING THAT IS NOT CONTROLLED WITH YOUR NAUSEA MEDICATION *UNUSUAL SHORTNESS OF BREATH *UNUSUAL BRUISING OR BLEEDING *URINARY PROBLEMS (pain or burning when urinating, or frequent urination) *BOWEL PROBLEMS (unusual diarrhea, constipation, pain near the anus) TENDERNESS IN MOUTH AND THROAT WITH OR WITHOUT PRESENCE OF ULCERS (sore throat, sores in mouth, or a toothache) UNUSUAL RASH, SWELLING OR PAIN  UNUSUAL VAGINAL DISCHARGE OR ITCHING   Items with * indicate a potential emergency and should be followed up as soon as possible or go to the Emergency Department if any problems should occur.  Please show the CHEMOTHERAPY ALERT CARD or IMMUNOTHERAPY ALERT  CARD at check-in to the Emergency Department and triage nurse.  Should you have questions after your visit or need to cancel or reschedule your appointment, please contact [facility]  Dept: [phone]  and follow the prompts.  Office hours are 8:00 a.m. to 4:30 p.m. Monday - Friday. Please note that voicemails left after 4:00 p.m. may not be returned until the following business day.  We are closed weekends and major holidays. You have access to a nurse at all times for urgent questions. Please call the main number to the clinic Dept: [phone] and follow the prompts.   For any non-urgent questions, you may also contact your provider using MyChart. We now offer e-Visits for anyone 47 and older to request care online for non-urgent symptoms. For details visit [email or web address].   Also download the MyChart app! Go to the app store, search "MyChart", open the app, select [place], and log in with your MyChart username and password.  Doxorubicin Injection What is this medication? DOXORUBICIN (dox oh ROO bi sin) treats some types of cancer. It works by slowing down the growth of cancer cells. This medicine may be used for other purposes; ask your health care provider or pharmacist if you have questions. COMMON BRAND NAME(S): Adriamycin, Adriamycin PFS, Adriamycin RDF, Rubex What should I tell my care team before I take this medication? They need to know if you have any of these conditions: Heart disease History of low blood cell levels caused by a medication Liver disease Recent or ongoing radiation An unusual or allergic reaction to doxorubicin, other medications, foods,  dyes, or preservatives If you or your partner are pregnant or trying to get pregnant Breast-feeding How should I use this medication? This medication is injected into a vein. It is given by your care team in a hospital or clinic setting. Talk to your care team about the use of  this medication in children. Special care may be needed. Overdosage: If you think you have taken too much of this medicine contact a poison control center or emergency room at once. NOTE: This medicine is only for you. Do not share this medicine with others. What if I miss a dose? Keep appointments for follow-up doses. It is important not to miss your dose. Call your care team if you are unable to keep an appointment. What may interact with this medication? 6-mercaptopurine Paclitaxel Phenytoin St. John's wort Trastuzumab Verapamil This list may not describe all possible interactions. Give your health care provider a list of all the medicines, herbs, non-prescription drugs, or dietary supplements you use. Also tell them if you smoke, drink alcohol, or use illegal drugs. Some items may interact with your medicine. What should I watch for while using this medication? Your condition will be monitored carefully while you are receiving this medication. You may need blood work while taking this medication. This medication may make you feel generally unwell. This is not uncommon as chemotherapy can affect healthy cells as well as cancer cells. Report any side effects. Continue your course of treatment even though you feel ill unless your care team tells you to stop. There is a maximum amount of this medication you should receive throughout your life. The amount depends on the medical condition being treated and your overall health. Your care team will watch how much of this medication you receive. Tell your care team if you have taken this medication before. Your urine may turn red for a few days after your dose. This is not blood. If your urine is dark or brown, call your care team. In some cases, you may be given additional medications to help with side effects. Follow all directions for their use. This medication may increase your risk of getting an infection. Call your care team for advice if you get  a fever, chills, sore throat, or other symptoms of a cold or flu. Do not treat yourself. Try to avoid being around people who are sick. This medication may increase your risk to bruise or bleed. Call your care team if you notice any unusual bleeding. Talk to your care team about your risk of cancer. You may be more at risk for certain types of cancers if you take this medication. Talk to your care team if you or your partner may be pregnant. Serious birth defects can occur if you take this medication during pregnancy and for 6 months after the last dose. Contraception is recommended while taking this medication and for 6 months after the last dose. Your care team can help you find the option that works for you. If your partner can get pregnant, use a condom while taking this medication and for 6 months after the last dose. Do not breastfeed while taking this medication. This medication may cause infertility. Talk to your care team if you are concerned about your fertility. What side effects may I notice from receiving this medication? Side effects that you should report to your care team as soon as possible: Allergic reactions--skin rash, itching, hives, swelling of the face, lips, tongue, or throat Heart failure--shortness of breath, swelling  of the ankles, feet, or hands, sudden weight gain, unusual weakness or fatigue Heart rhythm changes--fast or irregular heartbeat, dizziness, feeling faint or lightheaded, chest pain, trouble breathing Infection--fever, chills, cough, sore throat, wounds that don't heal, pain or trouble when passing urine, general feeling of discomfort or being unwell Low red blood cell level--unusual weakness or fatigue, dizziness, headache, trouble breathing Painful swelling, warmth, or redness of the skin, blisters or sores at the infusion site Unusual bruising or bleeding Side effects that usually do not require medical attention (report to your care team if they continue or  are bothersome): Diarrhea Hair loss Nausea Pain, redness, or swelling with sores inside the mouth or throat Red urine This list may not describe all possible side effects. Call your doctor for medical advice about side effects. You may report side effects to FDA at 1-800-FDA-1088. Where should I keep my medication? This medication is given in a hospital or clinic. It will not be stored at home. NOTE: This sheet is a summary. It may not cover all possible information. If you have questions about this medicine, talk to your doctor, pharmacist, or health care provider.  2024 Elsevier/Gold Standard (2022-08-31 00:00:00)  Cyclophosphamide Injection What is this medication? CYCLOPHOSPHAMIDE (sye kloe FOSS fa mide) treats some types of cancer. It works by slowing down the growth of cancer cells. This medicine may be used for other purposes; ask your health care provider or pharmacist if you have questions. COMMON BRAND NAME(S): Cyclophosphamide, Cytoxan, Neosar What should I tell my care team before I take this medication? They need to know if you have any of these conditions: Heart disease Irregular heartbeat or rhythm Infection Kidney problems Liver disease Low blood cell levels (white cells, platelets, or red blood cells) Lung disease Previous radiation Trouble passing urine An unusual or allergic reaction to cyclophosphamide, other medications, foods, dyes, or preservatives Pregnant or trying to get pregnant Breast-feeding How should I use this medication? This medication is injected into a vein. It is given by your care team in a hospital or clinic setting. Talk to your care team about the use of this medication in children. Special care may be needed. Overdosage: If you think you have taken too much of this medicine contact a poison control center or emergency room at once. NOTE: This medicine is only for you. Do not share this medicine with others. What if I miss a dose? Keep  appointments for follow-up doses. It is important not to miss your dose. Call your care team if you are unable to keep an appointment. What may interact with this medication? Amphotericin B Amiodarone Azathioprine Certain antivirals for HIV or hepatitis Certain medications for blood pressure, such as enalapril, lisinopril, quinapril Cyclosporine Diuretics Etanercept Indomethacin Medications that relax muscles Metronidazole Natalizumab Tamoxifen Warfarin This list may not describe all possible interactions. Give your health care provider a list of all the medicines, herbs, non-prescription drugs, or dietary supplements you use. Also tell them if you smoke, drink alcohol, or use illegal drugs. Some items may interact with your medicine. What should I watch for while using this medication? This medication may make you feel generally unwell. This is not uncommon as chemotherapy can affect healthy cells as well as cancer cells. Report any side effects. Continue your course of treatment even though you feel ill unless your care team tells you to stop. You may need blood work while you are taking this medication. This medication may increase your risk of getting an infection. Call  your care team for advice if you get a fever, chills, sore throat, or other symptoms of a cold or flu. Do not treat yourself. Try to avoid being around people who are sick. Avoid taking medications that contain aspirin, acetaminophen, ibuprofen, naproxen, or ketoprofen unless instructed by your care team. These medications may hide a fever. Be careful brushing or flossing your teeth or using a toothpick because you may get an infection or bleed more easily. If you have any dental work done, tell your dentist you are receiving this medication. Drink water or other fluids as directed. Urinate often, even at night. Some products may contain alcohol. Ask your care team if this medication contains alcohol. Be sure to tell all  care teams you are taking this medicine. Certain medicines, like metronidazole and disulfiram, can cause an unpleasant reaction when taken with alcohol. The reaction includes flushing, headache, nausea, vomiting, sweating, and increased thirst. The reaction can last from 30 minutes to several hours. Talk to your care team if you wish to become pregnant or think you might be pregnant. This medication can cause serious birth defects if taken during pregnancy and for 1 year after the last dose. A negative pregnancy test is required before starting this medication. A reliable form of contraception is recommended while taking this medication and for 1 year after the last dose. Talk to your care team about reliable forms of contraception. Do not father a child while taking this medication and for 4 months after the last dose. Use a condom during this time period. Do not breast-feed while taking this medication or for 1 week after the last dose. This medication may cause infertility. Talk to your care team if you are concerned about your fertility. Talk to your care team about your risk of cancer. You may be more at risk for certain types of cancer if you take this medication. What side effects may I notice from receiving this medication? Side effects that you should report to your care team as soon as possible: Allergic reactions--skin rash, itching, hives, swelling of the face, lips, tongue, or throat Dry cough, shortness of breath or trouble breathing Heart failure--shortness of breath, swelling of the ankles, feet, or hands, sudden weight gain, unusual weakness or fatigue Heart muscle inflammation--unusual weakness or fatigue, shortness of breath, chest pain, fast or irregular heartbeat, dizziness, swelling of the ankles, feet, or hands Heart rhythm changes--fast or irregular heartbeat, dizziness, feeling faint or lightheaded, chest pain, trouble breathing Infection--fever, chills, cough, sore throat,  wounds that don't heal, pain or trouble when passing urine, general feeling of discomfort or being unwell Kidney injury--decrease in the amount of urine, swelling of the ankles, hands, or feet Liver injury--right upper belly pain, loss of appetite, nausea, light-colored stool, dark yellow or brown urine, yellowing skin or eyes, unusual weakness or fatigue Low red blood cell level--unusual weakness or fatigue, dizziness, headache, trouble breathing Low sodium level--muscle weakness, fatigue, dizziness, headache, confusion Red or dark brown urine Unusual bruising or bleeding Side effects that usually do not require medical attention (report to your care team if they continue or are bothersome): Hair loss Irregular menstrual cycles or spotting Loss of appetite Nausea Pain, redness, or swelling with sores inside the mouth or throat Vomiting This list may not describe all possible side effects. Call your doctor for medical advice about side effects. You may report side effects to FDA at 1-800-FDA-1088. Where should I keep my medication? This medication is given in a hospital or  clinic. It will not be stored at home. NOTE: This sheet is a summary. It may not cover all possible information. If you have questions about this medicine, talk to your doctor, pharmacist, or health care provider.  2024 Elsevier/Gold Standard (2021-10-14 00:00:00)

## 2023-01-01 NOTE — Assessment & Plan Note (Signed)
Right lumpectomy: Grade 3 IDC, 2 cm in size inferior and posterior margins are positive, DCIS posterior margin positive, lymphovascular invasion not identified, ER 0%, PR 0%, Ki-67 85%, HER2 0 by IHC Margin excision: Posterior margin: Scattered foci of high-grade DCIS, 0/2 lymph nodes negative, final margins negative  (10/02/2022: Initial biopsy was DCIS ER 30% PR 2%)   Treatment plan: Adjuvant chemotherapy with dose dense Adriamycin and Cytoxan followed by Taxol and carboplatin Adjuvant radiation therapy ----------------------------------------------------------------------------------------------------------------------------------- Current treatment: Cycle 1 dose dense Adriamycin and Cytoxan Labs reviewed, antiemetics were reviewed, chemo consent obtained, chemo education completed Echocardiogram 12/29/2022: EF 60 to 65%  Return to clinic in 1 week for toxicity check

## 2023-01-02 ENCOUNTER — Telehealth: Payer: Self-pay

## 2023-01-02 ENCOUNTER — Encounter: Payer: Self-pay | Admitting: Hematology and Oncology

## 2023-01-02 NOTE — Telephone Encounter (Signed)
-----   Message from Nurse Zetta Bills sent at 01/01/2023  2:06 PM EDT ----- Regarding: GUDENA 1ST TIME A/C Patient received 1st time A/C.  Tolerated well.  No s/s or c/o distress or discomfort.

## 2023-01-02 NOTE — Progress Notes (Signed)
Received voicemail from patient.  Returned call to patient to discuss [employer].  Patient was approved for one-time $1000 [employer] grant to assist with personal expenses while going through treatment. She completed paperwork on 01/01/23 and received a copy of the approval and expense sheet. Discussed each expense in detail and how they are submitted or obtained. She verbalized understanding. Answered her questions.  She has my card for any additional financial questions or concerns.

## 2023-01-02 NOTE — Telephone Encounter (Signed)
LM for patient that this nurse was calling to see how they were doing after their treatment. Please call back to Dr. Gudena's nurse at [phone] if they have any questions or concerns regarding the treatment. 

## 2023-01-03 ENCOUNTER — Telehealth: Payer: Self-pay

## 2023-01-03 ENCOUNTER — Inpatient Hospital Stay: Payer: 59

## 2023-01-03 ENCOUNTER — Other Ambulatory Visit: Payer: Self-pay

## 2023-01-03 VITALS — BP 117/62 | HR 81 | Temp 98.4°F | Resp 18

## 2023-01-03 DIAGNOSIS — C50411 Malignant neoplasm of upper-outer quadrant of right female breast: Secondary | ICD-10-CM

## 2023-01-03 DIAGNOSIS — Z5189 Encounter for other specified aftercare: Secondary | ICD-10-CM | POA: Diagnosis not present

## 2023-01-03 DIAGNOSIS — Z171 Estrogen receptor negative status [ER-]: Secondary | ICD-10-CM | POA: Diagnosis not present

## 2023-01-03 DIAGNOSIS — D0511 Intraductal carcinoma in situ of right breast: Secondary | ICD-10-CM | POA: Diagnosis not present

## 2023-01-03 DIAGNOSIS — Z803 Family history of malignant neoplasm of breast: Secondary | ICD-10-CM | POA: Diagnosis not present

## 2023-01-03 DIAGNOSIS — G4700 Insomnia, unspecified: Secondary | ICD-10-CM | POA: Diagnosis not present

## 2023-01-03 DIAGNOSIS — Z5111 Encounter for antineoplastic chemotherapy: Secondary | ICD-10-CM | POA: Diagnosis not present

## 2023-01-03 DIAGNOSIS — F419 Anxiety disorder, unspecified: Secondary | ICD-10-CM | POA: Diagnosis not present

## 2023-01-03 DIAGNOSIS — R530 Neoplastic (malignant) related fatigue: Secondary | ICD-10-CM | POA: Diagnosis not present

## 2023-01-03 MED ORDER — PEGFILGRASTIM-JMDB 6 MG/0.6ML ~~LOC~~ SOSY
6.0000 mg | PREFILLED_SYRINGE | Freq: Once | SUBCUTANEOUS | Status: AC
  Administered 2023-01-03: 6 mg via SUBCUTANEOUS

## 2023-01-03 NOTE — Patient Instructions (Signed)

## 2023-01-03 NOTE — Telephone Encounter (Signed)
Called Pt regarding difficulty sleeping. Received secure chat from flush nurse saying that Pt is c/o difficulty sleeping. Pt states she had difficulty sleeping prior to breast cancer treatment, but now it has worsened. Pt reports taking tylenol PM. Advised Pt to add additional 25mg  tablet of benadryl, noise machine to reduce outside noise, dark environment, no screens one hour before bed, and discussing with NP on 01/08/23 per MD. Pt states her anxiety also prevents sleep. Pt is medicated for anxiety. Advised Pt to reach out to her prescribing MD as anxiety meds may need to be adjusted. Pt verbalized understanding.

## 2023-01-03 NOTE — Progress Notes (Signed)
Pt. Came in for injection.  States she spoke with the nurse in regards to difficulty sleeping.  States she wasn't sleeping much before treatment, but its worse now.  Secured chatted Dr. Pamelia Hoit and Abner Greenspan.  Dr. Pamelia Hoit recommended OTC Tylenol PM, Benadryl, or Melatonin.  Pt. States Tylenol PM doesn't work, Benadryl causes issues with her stomach ulcer/acid reflux!  Dr. Pamelia Hoit states will discuss at her next visit with him.  Theora Gianotti states she will call the pt. To discuss.  Pt. Informed.

## 2023-01-03 NOTE — Telephone Encounter (Signed)
Called Pt regarding after hours message. Pt had reported left hand numbness, "veins sticking out" on left arm, and left arm erythema. Pt reports these symptoms have resolved. Pt states she has hx of issues with left shoulder that have previously caused numbness and hx of anxiety "which is why the nurse thought my breathing sounded weird, but I'm ok". Pt received first time A/C on 01/02/23. Pt denies fever, pain, SOB, or dehydration. Advised Pt to call us again if symptoms return. Pt verbalized understanding.

## 2023-01-05 ENCOUNTER — Telehealth: Payer: Self-pay | Admitting: *Deleted

## 2023-01-05 NOTE — Progress Notes (Unsigned)
  Care Coordination Note  01/05/2023 Name: REKA SNIDOW MRN: [id number] DOB: Mar 20, 1981  Rondel Baton Niehoff is a 42 y.o. year old female who is a primary care patient of Hoy Register, MD and is actively engaged with the care management team. I reached out to [name] by phone today to assist with re-scheduling an initial visit with the Licensed Clinical Social Worker  Follow up plan: Unsuccessful telephone outreach attempt made. A HIPAA compliant phone message was left for the patient providing contact information and requesting a return call.   [facility]  Care Coordination Care Guide  Direct Dial: [phone]

## 2023-01-08 ENCOUNTER — Inpatient Hospital Stay (HOSPITAL_BASED_OUTPATIENT_CLINIC_OR_DEPARTMENT_OTHER): Payer: 59 | Admitting: Adult Health

## 2023-01-08 ENCOUNTER — Other Ambulatory Visit: Payer: Self-pay

## 2023-01-08 ENCOUNTER — Other Ambulatory Visit: Payer: Self-pay | Admitting: Adult Health

## 2023-01-08 ENCOUNTER — Encounter: Payer: Self-pay | Admitting: Internal Medicine

## 2023-01-08 ENCOUNTER — Telehealth: Payer: Self-pay | Admitting: Hematology and Oncology

## 2023-01-08 ENCOUNTER — Encounter: Payer: Self-pay | Admitting: Interventional Cardiology

## 2023-01-08 ENCOUNTER — Inpatient Hospital Stay: Payer: 59

## 2023-01-08 ENCOUNTER — Encounter: Payer: Self-pay | Admitting: Adult Health

## 2023-01-08 VITALS — BP 134/84 | HR 108 | Temp 98.1°F | Resp 16 | Wt 209.1 lb

## 2023-01-08 DIAGNOSIS — Z5189 Encounter for other specified aftercare: Secondary | ICD-10-CM | POA: Diagnosis not present

## 2023-01-08 DIAGNOSIS — Z171 Estrogen receptor negative status [ER-]: Secondary | ICD-10-CM

## 2023-01-08 DIAGNOSIS — D0511 Intraductal carcinoma in situ of right breast: Secondary | ICD-10-CM | POA: Diagnosis not present

## 2023-01-08 DIAGNOSIS — C50411 Malignant neoplasm of upper-outer quadrant of right female breast: Secondary | ICD-10-CM

## 2023-01-08 DIAGNOSIS — Z5111 Encounter for antineoplastic chemotherapy: Secondary | ICD-10-CM | POA: Diagnosis not present

## 2023-01-08 DIAGNOSIS — Z803 Family history of malignant neoplasm of breast: Secondary | ICD-10-CM | POA: Diagnosis not present

## 2023-01-08 DIAGNOSIS — G4700 Insomnia, unspecified: Secondary | ICD-10-CM | POA: Diagnosis not present

## 2023-01-08 DIAGNOSIS — R530 Neoplastic (malignant) related fatigue: Secondary | ICD-10-CM | POA: Diagnosis not present

## 2023-01-08 DIAGNOSIS — F419 Anxiety disorder, unspecified: Secondary | ICD-10-CM | POA: Diagnosis not present

## 2023-01-08 LAB — CBC WITH DIFFERENTIAL (CANCER CENTER ONLY)
Abs Immature Granulocytes: 0.13 10*3/uL — ABNORMAL HIGH (ref 0.00–0.07)
Basophils Absolute: 0 10*3/uL (ref 0.0–0.1)
Basophils Relative: 0 %
Eosinophils Absolute: 0.1 10*3/uL (ref 0.0–0.5)
Eosinophils Relative: 1 %
HCT: 38.4 % (ref 36.0–46.0)
Hemoglobin: 12.9 g/dL (ref 12.0–15.0)
Immature Granulocytes: 1 %
Lymphocytes Relative: 26 %
Lymphs Abs: 2.4 10*3/uL (ref 0.7–4.0)
MCH: 28.2 pg (ref 26.0–34.0)
MCHC: 33.6 g/dL (ref 30.0–36.0)
MCV: 83.8 fL (ref 80.0–100.0)
Monocytes Absolute: 0.5 10*3/uL (ref 0.1–1.0)
Monocytes Relative: 6 %
Neutro Abs: 6.1 10*3/uL (ref 1.7–7.7)
Neutrophils Relative %: 66 %
Platelet Count: 228 10*3/uL (ref 150–400)
RBC: 4.58 MIL/uL (ref 3.87–5.11)
RDW: 12.9 % (ref 11.5–15.5)
WBC Count: 9.3 10*3/uL (ref 4.0–10.5)
nRBC: 0 % (ref 0.0–0.2)

## 2023-01-08 LAB — CMP (CANCER CENTER ONLY)
ALT: 10 U/L (ref 0–44)
AST: 10 U/L — ABNORMAL LOW (ref 15–41)
Albumin: 4.1 g/dL (ref 3.5–5.0)
Alkaline Phosphatase: 182 U/L — ABNORMAL HIGH (ref 38–126)
Anion gap: 6 (ref 5–15)
BUN: 8 mg/dL (ref 6–20)
CO2: 29 mmol/L (ref 22–32)
Calcium: 9.6 mg/dL (ref 8.9–10.3)
Chloride: 103 mmol/L (ref 98–111)
Creatinine: 0.79 mg/dL (ref 0.44–1.00)
GFR, Estimated: >60 mL/min (ref >60)
Glucose, Bld: 86 mg/dL (ref 70–99)
Potassium: 3.5 mmol/L (ref 3.5–5.1)
Sodium: 138 mmol/L (ref 135–145)
Total Bilirubin: 0.4 mg/dL (ref 0.3–1.2)
Total Protein: 7.1 g/dL (ref 6.5–8.1)

## 2023-01-08 NOTE — Telephone Encounter (Signed)
Scheduled appointments per WQ. Patient is aware of all made appointments. 

## 2023-01-08 NOTE — Progress Notes (Signed)
Cancer Center Cancer Follow up:    Laurie Register, MD [address] [place] [address]   DIAGNOSIS:  Cancer Staging  Malignant neoplasm of upper-outer quadrant of right breast in female, estrogen receptor negative (HCC) Staging form: Breast, AJCC 8th Edition - Clinical: Stage 0 (cTis (DCIS), cN0, cM0, G3, ER+, PR+, HER2: Not Assessed) - Signed by Serena Croissant, MD on 10/11/2022 Stage prefix: Initial diagnosis Histologic grading system: 3 grade system - Pathologic: Stage IIA (pT1c, pN1, cM0, G3, ER-, PR-, HER2-) - Unsigned Histologic grading system: 3 grade system   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, ER 30% weak, PR 2%   10/11/2022 Cancer Staging   Staging form: Breast, AJCC 8th Edition - Clinical: Stage 0 (cTis (DCIS), cN0, cM0, G3, ER+, PR+, HER2: Not Assessed) - Signed by Serena Croissant, MD on 10/11/2022 Stage prefix: Initial diagnosis Histologic grading system: 3 grade system   10/11/2022 Genetic Testing   Declined hereditary cancer genetic testing    11/13/2022 Surgery   Right lumpectomy: Grade 3 IDC, 2 cm in size inferior and posterior margins are positive, DCIS posterior margin positive, lymphovascular invasion not identified, ER 0%, PR 0%, Ki-67 85%, HER2 0 by [facility]   12/01/2022 Surgery   Margin excision: Posterior margin: Scattered foci of high-grade DCIS, 0/2 lymph nodes negative   01/01/2023 -  Chemotherapy   Patient is on Treatment Plan : BREAST ADJUVANT DOSE DENSE AC q14d / PACLitaxel q7d       CURRENT THERAPY: Adriamycin/Cytoxan  INTERVAL HISTORY: Laurie Bell 42 y.o. female returns for f/u after receiving her first cycle of Adriamycin and  Cytoxan.  She tolerated it moderately well.  She endorses fatigue, difficulty sleeping, and increased anxiety.  She notes she missed a couple of doses of her Methimazole for her hyperthyroidism this past week.  She has a sore mouth, but denies oral ulcerations, or dysphagia.     Patient Active Problem List   Diagnosis Date Noted   Port-A-Cath in place 01/01/2023   Malignant neoplasm of upper-outer quadrant of right breast in female, estrogen receptor negative (HCC) 10/09/2022   Multinodular goiter 03/22/2022   Subclinical hyperthyroidism 03/22/2022   MDD (major depressive disorder), recurrent episode, moderate (HCC) 11/03/2021   GAD (generalized anxiety disorder) 11/03/2021   Neck pain 10/28/2021   Cervical high risk HPV (human papillomavirus) test positive 10/17/2021   Irregular heart rhythm 04/15/2021   Numbness on left side 04/15/2021   Dysphagia 03/17/2021   Hypomagnesemia 02/23/2021   Gastroesophageal reflux disease    Hypokalemia 08/24/2020   Atypical chest pain 08/24/2020   Nausea and vomiting 08/24/2020   Hypocalcemia 08/24/2020   DUB (dysfunctional uterine bleeding) 01/12/2017   Fibroid, uterine 01/12/2017    is allergic to latex.  MEDICAL HISTORY: Past Medical History:  Diagnosis Date   Anxiety    Arthritis    Breast cancer (HCC)    Chlamydia contact, treated    GERD (gastroesophageal reflux disease)    Hyperthyroidism     SURGICAL HISTORY: Past Surgical History:  Procedure Laterality Date   AXILLARY SENTINEL NODE BIOPSY Right 12/01/2022   Procedure: RIGHT AXILLARY SENTINEL NODE BIOPSY;  Surgeon: Abigail Miyamoto, MD;  Location: [facility];  Service: General;  Laterality: Right;  BREAST BIOPSY Right 10/02/2022   MM RT BREAST BX W LOC DEV 1ST LESION IMAGE BX SPEC STEREO GUIDE 10/02/2022 [facility]   BREAST BIOPSY  11/10/2022   MM RT RADIOACTIVE SEED LOC MAMMO GUIDE 11/10/2022 [facility]   BREAST LUMPECTOMY WITH RADIOACTIVE SEED LOCALIZATION Right  11/13/2022   Procedure: RIGHT BREAST LUMPECTOMY WITH RADIOACTIVE SEED LOCALIZATION;  Surgeon: Abigail Miyamoto, MD;  Location: [facility];  Service: General;  Laterality: Right;   ENDOMETRIAL ABLATION N/A 04/25/2017   Procedure: ENDOMETRIAL ABLATION With NOVASURE;  Surgeon: Adam Phenix, MD;  Location: [facility];  Service: Gynecology;  Laterality: N/A;   ESOPHAGEAL MANOMETRY N/A 07/27/2021   Procedure: ESOPHAGEAL MANOMETRY (EM);  Surgeon: Imogene Burn, MD;  Location: [facility];  Service: Gastroenterology;  Laterality: N/A;   PORTACATH PLACEMENT N/A 12/01/2022   Procedure: INSERTION PORT-A-CATH WITH ULTRASOUND GUIDANCE;  Surgeon: Abigail Miyamoto, MD;  Location: [facility] OR;  Service: General;  Laterality: N/A;   RE-EXCISION OF BREAST CANCER,SUPERIOR MARGINS Right 12/01/2022   Procedure: RE-EXCISION OF RIGHT BREAST CANCER;  Surgeon: Abigail Miyamoto, MD;  Location: [facility];  Service: General;  Laterality: Right;   TUBAL LIGATION  2003    SOCIAL HISTORY: Social History   Socioeconomic History   Marital status: Single    Spouse name: Not on file   Number of children: 2   Years of education: Not on file   Highest education level: Not on file  Occupational History   Occupation: Event organiser: [employer]  Tobacco Use   Smoking status: Never   Smokeless tobacco: Never  Vaping Use   Vaping status: Never Used  Substance and Sexual Activity   Alcohol use: No   Drug use: Not Currently    Types: Marijuana    Comment: quit 2018   Sexual activity: Not Currently    Birth control/protection: Surgical  Other Topics Concern   Not on file  Social History Narrative   ** Merged History Encounter **       Right Handed  Lives in a one story home    Social Determinants of Health   Financial Resource Strain: Medium Risk (11/03/2021)   Overall Financial Resource Strain (CARDIA)    Difficulty of Paying Living Expenses: Somewhat hard  Food Insecurity: No Food  Insecurity (06/16/2021)   Hunger Vital Sign    Worried About Running Out of Food in the Last Year: Never true    Ran Out of Food in the Last Year: Never true  Transportation Needs: No Transportation Needs (11/28/2022)   PRAPARE - Administrator, Civil Service (Medical): No    Lack of Transportation (Non-Medical): No  Physical Activity: Sufficiently Active (11/03/2021)   Exercise Vital Sign    Days of Exercise per Week: 3 days    Minutes of Exercise per Session: 50 min  Stress: Stress Concern Present (11/03/2021)   [employer] of Occupational Health - Occupational Stress Questionnaire    Feeling of Stress : Very much  Social Connections: Moderately Isolated (11/03/2021)   Social Connection and Isolation Panel [NHANES]    Frequency of Communication with Friends and Family: Once a week    Frequency of Social Gatherings with Friends and Family: Once a week    Attends Religious Services: More than 4 times per year    Active Member of [employer] or Organizations: No    Attends Banker Meetings: Never    Marital Status: Living with partner  Intimate  Partner Violence: Not At Risk (11/03/2021)   Humiliation, Afraid, Rape, and Kick questionnaire    Fear of Current or Ex-Partner: No    Emotionally Abused: No    Physically Abused: No    Sexually Abused: No    FAMILY HISTORY: Family History  Problem Relation Age of Onset   Hypertension Mother    Hypertension Maternal Aunt    Breast cancer Maternal Grandmother        dx > 50   Stomach cancer Maternal Grandfather        dx > 50   Breast cancer Paternal Grandmother        dx > 50   Esophageal cancer Neg Hx    Rectal cancer Neg Hx    Colon cancer Neg Hx     Review of Systems  Constitutional:  Negative for appetite change, chills, fatigue, fever and unexpected weight change.  HENT:   Negative for hearing loss, lump/mass and trouble swallowing.   Eyes:  Negative for eye problems and icterus.  Respiratory:  Negative  for chest tightness, cough and shortness of breath.   Cardiovascular:  Negative for chest pain, leg swelling and palpitations.  Gastrointestinal:  Negative for abdominal distention, abdominal pain, constipation, diarrhea, nausea and vomiting.  Endocrine: Negative for hot flashes.  Genitourinary:  Negative for difficulty urinating.   Musculoskeletal:  Negative for arthralgias.  Skin:  Negative for itching and rash.  Neurological:  Negative for dizziness, extremity weakness, headaches and numbness.  Hematological:  Negative for adenopathy. Does not bruise/bleed easily.  Psychiatric/Behavioral:  Negative for depression. The patient is not nervous/anxious.       PHYSICAL EXAMINATION   Onc Performance Status - 01/08/23 1157       ECOG Perf Status   ECOG Perf Status Restricted in physically strenuous activity but ambulatory and able to carry out work of a light or sedentary nature, e.g., light house work, office work      KPS SCALE   KPS % SCORE Able to carry on normal activity, minor s/s of disease             Vitals:   01/08/23 1156  BP: 134/84  Pulse: (!) 108  Resp: 16  Temp: 98.1 F (36.7 C)  SpO2: 96%    Physical Exam Constitutional:      General: She is not in acute distress.    Appearance: Normal appearance. She is not toxic-appearing.  HENT:     Head: Normocephalic and atraumatic.     Mouth/Throat:     Mouth: Mucous membranes are moist.     Pharynx: Oropharynx is clear. No oropharyngeal exudate or posterior oropharyngeal erythema.  Eyes:     General: No scleral icterus. Cardiovascular:     Rate and Rhythm: Normal rate and regular rhythm.     Pulses: Normal pulses.     Heart sounds: Normal heart sounds.  Pulmonary:     Effort: Pulmonary effort is normal.     Breath sounds: Normal breath sounds.  Abdominal:     General: Abdomen is flat. Bowel sounds are normal. There is no distension.     Palpations: Abdomen is soft.     Tenderness: There is no abdominal  tenderness.  Musculoskeletal:        General: No swelling.     Cervical back: Neck supple.  Lymphadenopathy:     Cervical: No cervical adenopathy.  Skin:    General: Skin is warm and dry.     Findings: No rash.  Neurological:     General: No focal deficit present.     Mental Status: She is alert.  Psychiatric:        Mood and Affect: Mood normal.        Behavior: Behavior normal.     LABORATORY DATA:  CBC    Component Value Date/Time   WBC 9.3 01/08/2023 1137   WBC 7.3 12/23/2022 1628   RBC 4.58 01/08/2023 1137   HGB 12.9 01/08/2023 1137   HCT 38.4 01/08/2023 1137   PLT 228 01/08/2023 1137   MCV 83.8 01/08/2023 1137   MCH 28.2 01/08/2023 1137   MCHC 33.6 01/08/2023 1137   RDW 12.9 01/08/2023 1137   LYMPHSABS 2.4 01/08/2023 1137   MONOABS 0.5 01/08/2023 1137   EOSABS 0.1 01/08/2023 1137   BASOSABS 0.0 01/08/2023 1137    CMP     Component Value Date/Time   NA 138 01/08/2023 1137   NA 138 01/18/2022 1512   K 3.5 01/08/2023 1137   CL 103 01/08/2023 1137   CO2 29 01/08/2023 1137   GLUCOSE 86 01/08/2023 1137   BUN 8 01/08/2023 1137   BUN 9 01/18/2022 1512   CREATININE 0.79 01/08/2023 1137   CALCIUM 9.6 01/08/2023 1137   PROT 7.1 01/08/2023 1137   PROT 6.9 04/15/2021 0942   ALBUMIN 4.1 01/08/2023 1137   ALBUMIN 4.3 04/15/2021 0942   AST 10 (L) 01/08/2023 1137   ALT 10 01/08/2023 1137   ALKPHOS 182 (H) 01/08/2023 1137   BILITOT 0.4 01/08/2023 1137   GFRNONAA >60 01/08/2023 1137   GFRAA >60 02/01/2020 2233     ASSESSMENT and THERAPY PLAN:   Malignant neoplasm of upper-outer quadrant of right breast in female, estrogen receptor negative (HCC) Laurie Bell is a 42 year old woman with stage 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  Treatment plan: Right lumpectomy: Grade 3 IDC, 2 cm in size inferior and posterior margins are  positive, DCIS posterior margin positive, lymphovascular invasion not identified, ER 0%, PR 0%, Ki-67 85%, HER2 0 by IHC Margin excision: Posterior margin: Scattered foci of high-grade DCIS, 0/2 lymph nodes negative, final margins negative  (10/02/2022: Initial biopsy was DCIS ER 30% PR 2%)   Treatment plan: Adjuvant chemotherapy with dose dense Adriamycin and Cytoxan followed by Taxol and carboplatin Adjuvant radiation therapy -------------------------------------------------  Current treatment: Adriamycin and Cytoxan beginning January 01, 2023.  Chemo side effects: Fatigue: Likely related to chemotherapy and difficulty sleeping.  We discussed energy conservation today. Difficulty sleeping: We discussed sleep hygiene today.  I am hesitant send anything additional in for her since she did miss a couple of doses of her methimazole last week. Anxiety: Could be increased related to missing a couple of doses of methimazole.  As she regains her strength and energy we will see how she does this week and can consider Effexor XR when we see her in 1 week's time.  She continues on hydroxyzine.    Her labs today are stable.  I reviewed them with her in detail.  She will return in 1 week for labs, follow-up with Dr. Pamelia Hoit, and her next cycle of therapy.   All questions were answered. The patient knows to call the clinic with any problems, questions or concerns. We can certainly see the patient much sooner if necessary.  Total encounter time:30 minutes*in face-to-face visit time, chart review, lab review, care  coordination, order entry, and documentation of the encounter time.  Lillard Anes, NP 01/08/23 12:37 PM Medical Oncology and Hematology [facility] [address] [place], [place] [address] Tel. [phone]    Fax. [phone]  *Total Encounter Time as defined by the Centers for Medicare and Medicaid Services includes, in addition to the face-to-face time of a patient visit  (documented in the note above) non-face-to-face time: obtaining and reviewing outside history, ordering and reviewing medications, tests or procedures, care coordination (communications with other health care professionals or caregivers) and documentation in the medical record.

## 2023-01-08 NOTE — Assessment & Plan Note (Signed)
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  Treatment plan: Right lumpectomy: Grade 3 IDC, 2 cm in size inferior and posterior margins are positive, DCIS posterior margin positive, lymphovascular invasion not identified, ER 0%, PR 0%, Ki-67 85%, HER2 0 by IHC Margin excision: Posterior margin: Scattered foci of high-grade DCIS, 0/2 lymph nodes negative, final margins negative  (10/02/2022: Initial biopsy was DCIS ER 30% PR 2%)   Treatment plan: Adjuvant chemotherapy with dose dense Adriamycin and Cytoxan followed by Taxol and carboplatin Adjuvant radiation therapy -------------------------------------------------  Current treatment: Adriamycin and Cytoxan beginning January 01, 2023.  Chemo side effects: Fatigue: Likely related to chemotherapy and difficulty sleeping.  We discussed energy conservation today. Difficulty sleeping: We discussed sleep hygiene today.  I am hesitant send anything additional in for her since she did miss a couple of doses of her methimazole last week. Anxiety: Could be increased related to missing a couple of doses of methimazole.  As she regains her strength and energy we will see how she does this week and can consider Effexor XR when we see her in 1 week's time.  She continues on hydroxyzine.    Her labs today are stable.  I reviewed them with her in detail.  She will return in 1 week for labs, follow-up with Dr. Pamelia Hoit, and her next cycle of therapy.

## 2023-01-09 ENCOUNTER — Encounter: Payer: Self-pay | Admitting: Hematology and Oncology

## 2023-01-09 ENCOUNTER — Encounter (HOSPITAL_BASED_OUTPATIENT_CLINIC_OR_DEPARTMENT_OTHER): Payer: Self-pay | Admitting: Emergency Medicine

## 2023-01-09 DIAGNOSIS — Z91040 Latex allergy status: Secondary | ICD-10-CM | POA: Diagnosis not present

## 2023-01-09 DIAGNOSIS — Z853 Personal history of malignant neoplasm of breast: Secondary | ICD-10-CM | POA: Diagnosis not present

## 2023-01-09 DIAGNOSIS — M79662 Pain in left lower leg: Secondary | ICD-10-CM | POA: Diagnosis not present

## 2023-01-09 DIAGNOSIS — M79605 Pain in left leg: Secondary | ICD-10-CM | POA: Diagnosis not present

## 2023-01-09 NOTE — ED Triage Notes (Signed)
Left leg pain calf to knee. Currently getting cancer treatments, [facility] team sent her for concerns DVT.

## 2023-01-10 ENCOUNTER — Emergency Department (HOSPITAL_BASED_OUTPATIENT_CLINIC_OR_DEPARTMENT_OTHER)
Admission: EM | Admit: 2023-01-10 | Discharge: 2023-01-10 | Disposition: A | Discharge status: Home / Self Care | Payer: 59 | Attending: Emergency Medicine | Admitting: Emergency Medicine

## 2023-01-10 ENCOUNTER — Ambulatory Visit (HOSPITAL_BASED_OUTPATIENT_CLINIC_OR_DEPARTMENT_OTHER)
Admission: RE | Admit: 2023-01-10 | Discharge: 2023-01-10 | Disposition: A | Discharge status: Home / Self Care | Payer: 59 | Source: Ambulatory Visit | Attending: Emergency Medicine | Admitting: Emergency Medicine

## 2023-01-10 DIAGNOSIS — M79605 Pain in left leg: Secondary | ICD-10-CM | POA: Insufficient documentation

## 2023-01-10 DIAGNOSIS — R600 Localized edema: Secondary | ICD-10-CM | POA: Diagnosis not present

## 2023-01-10 NOTE — ED Provider Notes (Signed)
[facility] Provider Note: Lowella Dell, MD, FACEP  CSN: [id number] MRN: [id number] ARRIVAL: 01/09/23 at 2232 ROOM: DB014/DB014   CHIEF COMPLAINT  Leg Pain   HISTORY OF PRESENT ILLNESS  01/10/23 1:20 AM Laurie Bell is a 42 y.o. female who is currently undergoing treatment for breast cancer.  She is here with left lower extremity pain.  This awakened her from sleep about 6:30 PM yesterday evening.  She rates the pain as an 8 out of 10.  It is present in her left anterior thigh and in her left calf.  She contacted her oncologist and was told to come to the ED to be evaluated for a DVT.   Past Medical History:  Diagnosis Date   Anxiety    Arthritis    Breast cancer (HCC)    Chlamydia contact, treated    GERD (gastroesophageal reflux disease)    Hyperthyroidism     Past Surgical History:  Procedure Laterality Date   AXILLARY SENTINEL NODE BIOPSY Right 12/01/2022   Procedure: RIGHT AXILLARY SENTINEL NODE BIOPSY;  Surgeon: Abigail Miyamoto, MD;  Location: [facility];  Service: General;  Laterality: Right;   BREAST BIOPSY Right 10/02/2022   MM RT BREAST BX W LOC DEV 1ST LESION IMAGE BX SPEC STEREO GUIDE 10/02/2022 [facility]   BREAST BIOPSY  11/10/2022   MM RT RADIOACTIVE SEED LOC MAMMO GUIDE 11/10/2022 [facility]   BREAST LUMPECTOMY WITH RADIOACTIVE SEED LOCALIZATION Right 11/13/2022   Procedure: RIGHT BREAST LUMPECTOMY WITH RADIOACTIVE SEED LOCALIZATION;  Surgeon: Abigail Miyamoto, MD;  Location: [facility];  Service: General;  Laterality: Right;   ENDOMETRIAL ABLATION N/A 04/25/2017   Procedure: ENDOMETRIAL ABLATION With NOVASURE;  Surgeon: Adam Phenix, MD;  Location: [facility];  Service: Gynecology;  Laterality: N/A;   ESOPHAGEAL MANOMETRY N/A 07/27/2021   Procedure: ESOPHAGEAL MANOMETRY (EM);  Surgeon: Imogene Burn, MD;  Location: [facility];  Service: Gastroenterology;  Laterality: N/A;   PORTACATH PLACEMENT N/A  12/01/2022   Procedure: INSERTION PORT-A-CATH WITH ULTRASOUND GUIDANCE;  Surgeon: Abigail Miyamoto, MD;  Location: [facility];  Service: General;  Laterality: N/A;   RE-EXCISION OF BREAST CANCER,SUPERIOR MARGINS Right 12/01/2022   Procedure: RE-EXCISION OF RIGHT BREAST CANCER;  Surgeon: Abigail Miyamoto, MD;  Location: [facility];  Service: General;  Laterality: Right;   TUBAL LIGATION  2003    Family History  Problem Relation Age of Onset   Hypertension Mother    Hypertension Maternal Aunt    Breast cancer Maternal Grandmother        dx > 50   Stomach cancer Maternal Grandfather        dx > 50   Breast cancer Paternal Grandmother        dx > 50   Esophageal cancer Neg Hx    Rectal cancer Neg Hx    Colon cancer Neg Hx     Social History   Tobacco Use   Smoking status: Never   Smokeless tobacco: Never  Vaping Use   Vaping status: Never Used  Substance Use Topics   Alcohol use: No   Drug use: Not Currently    Types: Marijuana    Comment: quit 2018    Prior to Admission medications   Medication Sig Start Date End Date Taking? Authorizing Provider  acetaminophen (TYLENOL) 500 MG tablet Take 1,000 mg by mouth every 6 (six) hours as needed for moderate pain.    [provider]  albuterol (VENTOLIN HFA) 108 (90  Base) MCG/ACT inhaler TAKE 2 PUFFS BY MOUTH EVERY 6 HOURS AS NEEDED FOR WHEEZE OR SHORTNESS OF BREATH 12/22/22   Hoy Register, MD  AMBULATORY NON FORMULARY MEDICATION Medication Name: GI Cocktail-270 ml viscous Xylocaine 2% with 270 ml Dicyclomine 10mg /5 ml and 810 ml Mylanta SIG take 8-10 ml PO every 4-6 hours PRN severe stomach pain 09/14/22   Imogene Burn, MD  cyclobenzaprine (FLEXERIL) 10 MG tablet TAKE 1 TABLET BY MOUTH AT BEDTIME AS NEEDED FOR MUSCLE SPASMS 10/31/22   Kirsteins, Victorino Sparrow, MD  dexamethasone (DECADRON) 4 MG tablet Take 1 tablet day after chemo and 1 tablet 2 days after chemo with food 12/12/22   Serena Croissant, MD  DULoxetine (CYMBALTA) 60 MG capsule Take  1 capsule (60 mg total) by mouth daily. 11/29/22   Hoy Register, MD  esomeprazole (NEXIUM) 40 MG capsule TAKE 1 CAPSULE BY MOUTH EVERY MORNING AND AT BEDTIME 12/19/22   Imogene Burn, MD  fluticasone [facility]) 50 MCG/ACT nasal spray Place 2 sprays into both nostrils daily. 12/30/21   Lurline Idol, FNP  gabapentin (NEURONTIN) 400 MG capsule Take 400 mg by mouth 3 (three) times daily.    [provider]  hydrOXYzine (ATARAX) 25 MG tablet TAKE 1 TABLET(25 MG) BY MOUTH THREE TIMES DAILY 11/29/22   Hoy Register, MD  ipratropium (ATROVENT) 0.03 % nasal spray Place 2 sprays into both nostrils every 12 (twelve) hours. 12/20/22   Roxy Horseman, PA-C  lidocaine (LIDODERM) 5 % Place 1 patch onto the skin daily. 08/26/21   Hoy Register, MD  lidocaine-prilocaine (EMLA) cream Apply to affected area once 12/12/22   Serena Croissant, MD  linaclotide [facility]) 145 MCG CAPS capsule Take 1 capsule (145 mcg total) by mouth daily before breakfast. 05/19/22   Imogene Burn, MD  meloxicam (MOBIC) 7.5 MG tablet Take by mouth. 10/14/22   [provider]  methimazole (TAPAZOLE) 5 MG tablet Take 1 tablet (5 mg total) by mouth as directed. 1 tablet Monday through Friday, skip Saturdays and  Sundays 09/27/22   Shamleffer, Konrad Dolores, MD  metoprolol succinate (TOPROL-XL) 25 MG 24 hr tablet Take 0.5 tablets (12.5 mg total) by mouth daily. For increased heart rate 11/29/22   Newlin, Odette Horns, MD  naproxen (NAPROSYN) 500 MG tablet TAKE 1 TABLET BY MOUTH 2 TIMES DAILY AS NEEDED FOR PAIN. 11/13/22   Hoy Register, MD  ondansetron (ZOFRAN) 8 MG tablet Take 1 tab (8 mg) by mouth every 8 hrs as needed for nausea/vomiting. Start third day after doxorubicin/cyclophosphamide chemotherapy. 12/12/22   Serena Croissant, MD  potassium chloride (KLOR-CON) 10 MEQ tablet Take by mouth. 08/08/22   [provider]  prochlorperazine (COMPAZINE) 10 MG tablet Take 1 tablet (10 mg total) by mouth every 6 (six) hours as needed  for nausea or vomiting. 12/12/22   Serena Croissant, MD  medroxyPROGESTERone (PROVERA) 10 MG tablet Take 2 tablets by mouth daily Patient not taking: Reported on 06/05/2019 02/06/18 02/02/20  Conan Bowens, MD    Allergies Latex   REVIEW OF SYSTEMS  Negative except as noted here or in the History of Present Illness.   PHYSICAL EXAMINATION  Initial Vital Signs Blood pressure (!) 125/95, pulse 96, temperature 98.3 F (36.8 C), resp. rate 18, SpO2 98%.  Examination General: Well-developed, well-nourished female in no acute distress; appearance consistent with age of record HENT: normocephalic; atraumatic Eyes: Normal appearance Neck: supple Heart: regular rate and rhythm Lungs: clear to auscultation bilaterally Abdomen: soft; nondistended; nontender; bowel sounds present Extremities:  No deformity; mild left lower extremity tenderness without significant edema Neurologic: Awake, alert and oriented; motor function intact in all extremities and symmetric; no facial droop Skin: Warm and dry Psychiatric: Normal mood and affect   RESULTS  Summary of this visit's results, reviewed and interpreted by myself:   EKG Interpretation Date/Time:    Ventricular Rate:    PR Interval:    QRS Duration:    QT Interval:    QTC Calculation:   R Axis:      Text Interpretation:         Laboratory Studies: Results for orders placed or performed during the hospital encounter of 01/10/23 (from the past 24 hour(s))  D-dimer, quantitative     Status: None   Collection Time: 01/10/23 12:52 AM  Result Value Ref Range   D-Dimer, Quant 0.39 0.00 - 0.50 ug/mL-FEU   Imaging Studies: No results found.  ED COURSE and MDM  Nursing notes, initial and subsequent vitals signs, including pulse oximetry, reviewed and interpreted by myself.  Vitals:   01/09/23 2256 01/10/23 0037 01/10/23 0145  BP: (!) 125/95    Pulse: 96  92  Resp: 18    Temp: 98.3 F (36.8 C)    SpO2: 98% 98% 100%   Medications -  No data to display  The patient's D-dimer is within normal limits.  She is at risk for DVT because of her ongoing cancer.  We will have her return for an outpatient Doppler study as ultrasound is not available at the present time.  A shot of Lovenox was recommended in the meantime but the patient refused this because she states her father had an unfavorable experience with anticoagulation.  PROCEDURES  Procedures   ED DIAGNOSES     ICD-10-CM   1. Acute pain of left lower extremity  M79.605 US Venous Img Lower Unilateral Left (DVT)         Ilyas Lipsitz, MD 01/10/23 [phone]

## 2023-01-10 NOTE — ED Notes (Signed)
Pt very upset with process of [place] and getting a call early in morning to schedule for tomorrow. Pt did not want to be woken at 0700 after being here at 0200. This nurse attempted to get [place] scheduled for pt prior to discharge and CT department on duty stated that they do not schedule them at night that the patient must wait for the scheduler to come in in the am.

## 2023-01-11 NOTE — Progress Notes (Signed)
  Care Coordination Note  01/11/2023 Name: Laurie Bell MRN: [id number] DOB: 1980-06-24  [name] is a 42 y.o. year old female who is a primary care patient of Hoy Register, MD and is actively engaged with the care management team. I reached out to [name] by phone today to assist with re-scheduling an initial visit with the Licensed Clinical Social Worker  Follow up plan: Telephone appointment with care management team member scheduled for:01/15/23  [facility]  Care Coordination Care Guide  Direct Dial: [phone]

## 2023-01-11 NOTE — Progress Notes (Signed)
  Care Coordination Note  01/11/2023 Name: Laurie Bell MRN: [id number] DOB: [birth year]  Laurie Bell is a 42 y.o. year old female who is a primary care patient of Hoy Register, MD and is actively engaged with the care management team. I reached out to [name] by phone today to assist with re-scheduling a follow up visit with the Licensed Clinical Social Worker  Follow up plan: Unsuccessful telephone outreach attempt made. A HIPAA compliant phone message was left for the patient providing contact information and requesting a return call.   [facility]  Care Coordination Care Guide  Direct Dial: [phone]

## 2023-01-12 ENCOUNTER — Telehealth: Payer: Self-pay | Admitting: *Deleted

## 2023-01-12 ENCOUNTER — Other Ambulatory Visit: Payer: Self-pay

## 2023-01-12 MED ORDER — MAGIC MOUTHWASH W/LIDOCAINE: 5.0000 mL | Freq: Four times a day (QID) | ORAL | 3 refills | Status: DC | PRN

## 2023-01-12 NOTE — Telephone Encounter (Signed)
Called patient to inform of the f/u appt needed in reference to complaints of pain and discomfort when swallowing at back of throat, per Dr. Leonides Schanz. Patient agreed to schedule for 02/20/23 at 1:30pm.

## 2023-01-15 ENCOUNTER — Encounter: Payer: Self-pay | Admitting: *Deleted

## 2023-01-15 ENCOUNTER — Ambulatory Visit: Payer: Self-pay | Admitting: Licensed Clinical Social Worker

## 2023-01-15 MED FILL — Dexamethasone Sodium Phosphate Inj 100 MG/10ML: INTRAMUSCULAR | Qty: 1 | Status: AC

## 2023-01-15 MED FILL — Fosaprepitant Dimeglumine For IV Infusion 150 MG (Base Eq): INTRAVENOUS | Qty: 5 | Status: AC

## 2023-01-15 NOTE — Progress Notes (Signed)
Patient Care Team: Hoy Register, MD as PCP - General (Family Medicine) Corky Crafts, MD as PCP - Cardiology (Cardiology) Glendale Chard, DO as Consulting Physician (Neurology) Abigail Miyamoto, MD as Consulting Physician (General Surgery) Serena Croissant, MD as Consulting Physician (Hematology and Oncology) Lonie Peak, MD as Attending Physician (Radiation Oncology) Pershing Proud, RN as Oncology Nurse Navigator Donnelly Angelica, RN as Oncology Nurse Navigator  DIAGNOSIS:  Encounter Diagnosis  Name Primary?   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, ER 30% weak, PR 2%   10/11/2022 Cancer Staging   Staging form: Breast, AJCC 8th Edition - Clinical: Stage 0 (cTis (DCIS), cN0, cM0, G3, ER+, PR+, HER2: Not Assessed) - Signed by Serena Croissant, MD on 10/11/2022 Stage prefix: Initial diagnosis Histologic grading system: 3 grade system   10/11/2022 Genetic Testing   Declined hereditary cancer genetic testing    11/13/2022 Surgery   Right lumpectomy: Grade 3 IDC, 2 cm in size inferior and posterior margins are positive, DCIS posterior margin positive, lymphovascular invasion not identified, ER 0%, PR 0%, Ki-67 85%, HER2 0 by [facility]   12/01/2022 Surgery   Margin excision: Posterior margin: Scattered foci of high-grade DCIS, 0/2 lymph nodes negative   01/01/2023 -  Chemotherapy   Patient is on Treatment Plan : BREAST ADJUVANT DOSE DENSE AC q14d / PACLitaxel q7d       CHIEF COMPLIANT: Cycle 2 dose dense Adriamycin and Cytoxan    INTERVAL HISTORY: Laurie Bell is a 42 y.o.  female is here because of ER/PR negative. She presents to the clinic for a follow-up. Patient reports that she had some fatigue. She states that she can't sleep at night. Appetite and taste was good. Overall she had no other complaints.  ALLERGIES:  is allergic to latex.  MEDICATIONS:  Current Outpatient Medications  Medication Sig Dispense Refill   acetaminophen (TYLENOL) 500 MG tablet Take 1,000 mg by mouth every 6 (six) hours as needed for moderate pain.     albuterol (VENTOLIN HFA) 108 (90 Base) MCG/ACT inhaler TAKE 2 PUFFS BY MOUTH EVERY 6 HOURS AS NEEDED FOR WHEEZE OR SHORTNESS OF BREATH 54 each 0   AMBULATORY NON FORMULARY MEDICATION Medication Name: GI Cocktail-270 ml viscous Xylocaine 2% with 270 ml Dicyclomine 10mg /5 ml and 810 ml Mylanta SIG take 8-10 ml PO every 4-6 hours PRN severe stomach pain 1350 mL 0   cyclobenzaprine (FLEXERIL) 10 MG tablet TAKE 1 TABLET BY MOUTH AT BEDTIME AS NEEDED FOR MUSCLE SPASMS 30 tablet 1   dexamethasone (DECADRON) 4 MG tablet Take 1 tablet day after chemo and 1 tablet 2 days after chemo with food 30 tablet 0   DULoxetine (CYMBALTA) 60 MG capsule Take 1 capsule (60 mg total) by mouth daily. 90 capsule 1   esomeprazole (NEXIUM) 40 MG capsule TAKE 1 CAPSULE BY MOUTH EVERY MORNING AND AT BEDTIME 60 capsule 4   fluticasone (FLONASE) 50 MCG/ACT nasal spray Place 2 sprays into both nostrils daily. 16 g 0   gabapentin (NEURONTIN) 400 MG capsule Take 400 mg by mouth 3 (three) times daily.     hydrOXYzine (ATARAX) 25 MG tablet TAKE 1 TABLET(25  MG) BY MOUTH THREE TIMES DAILY 270 tablet 1   ipratropium (ATROVENT) 0.03 % nasal spray Place 2 sprays into both nostrils every 12 (twelve) hours. 30 mL 0   lidocaine (LIDODERM) 5 % Place 1 patch onto the skin daily. 30 patch 3   lidocaine-prilocaine (EMLA) cream Apply to affected area once 30 g 3   linaclotide (LINZESS) 145 MCG CAPS capsule Take 1 capsule (145 mcg total) by mouth daily before breakfast. 30 capsule 5    magic mouthwash w/lidocaine SOLN Take 5 mLs by mouth 4 (four) times daily as needed for mouth pain. 240 mL 3   meloxicam (MOBIC) 7.5 MG tablet Take by mouth.     methimazole (TAPAZOLE) 5 MG tablet Take 1 tablet (5 mg total) by mouth as directed. 1 tablet Monday through Friday, skip Saturdays and  Sundays 60 tablet 3   metoprolol succinate (TOPROL-XL) 25 MG 24 hr tablet Take 0.5 tablets (12.5 mg total) by mouth daily. For increased heart rate 45 tablet 1   naproxen (NAPROSYN) 500 MG tablet TAKE 1 TABLET BY MOUTH 2 TIMES DAILY AS NEEDED FOR PAIN. 60 tablet 1   ondansetron (ZOFRAN) 8 MG tablet Take 1 tab (8 mg) by mouth every 8 hrs as needed for nausea/vomiting. Start third day after doxorubicin/cyclophosphamide chemotherapy. 30 tablet 1   potassium chloride (KLOR-CON) 10 MEQ tablet Take by mouth.     prochlorperazine (COMPAZINE) 10 MG tablet Take 1 tablet (10 mg total) by mouth every 6 (six) hours as needed for nausea or vomiting. 30 tablet 1   zolpidem (AMBIEN) 5 MG tablet Take 1 tablet (5 mg total) by mouth at bedtime as needed for sleep. 30 tablet 0   No current facility-administered medications for this visit.    PHYSICAL EXAMINATION: ECOG PERFORMANCE STATUS: 1 - Symptomatic but completely ambulatory  Vitals:   01/16/23 1010  BP: 137/83  Pulse: 96  Resp: 18  Temp: (!) 97.3 F (36.3 C)  SpO2: 100%   Filed Weights   01/16/23 1010  Weight: 212 lb 11.2 oz (96.5 kg)     LABORATORY DATA:  I have reviewed the data as listed    Latest Ref Rng & Units 01/08/2023   11:37 AM 01/01/2023   10:01 AM 12/23/2022    4:28 PM  CMP  Glucose 70 - 99 mg/dL 86  85  962   BUN 6 - 20 mg/dL 8  6  10    Creatinine 0.44 - 1.00 mg/dL 9.52  8.41  3.24   Sodium 135 - 145 mmol/L 138  138  136   Potassium 3.5 - 5.1 mmol/L 3.5  4.0  3.5   Chloride 98 - 111 mmol/L 103  106  104   CO2 22 - 32 mmol/L 29  25  25    Calcium 8.9 - 10.3 mg/dL 9.6  9.3  9.0   Total Protein 6.5 - 8.1 g/dL 7.1  7.0  8.0   Total  Bilirubin 0.3 - 1.2 mg/dL 0.4  0.3  0.6   Alkaline Phos 38 - 126 U/L 182  129  128   AST 15 - 41 U/L 10  14  15    ALT 0 - 44 U/L 10  11  15      Lab Results  Component Value Date   WBC 12.8 (H) 01/16/2023   HGB 12.2 01/16/2023   HCT 35.9 (L) 01/16/2023   MCV 83.5 01/16/2023   PLT 176 01/16/2023   NEUTROABS PENDING 01/16/2023    ASSESSMENT &  PLAN:  Malignant neoplasm of upper-outer quadrant of right breast in female, estrogen receptor negative (HCC) 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, DCIS posterior margin positive, lymphovascular invasion not identified, ER 0%, PR 0%, Ki-67 85%, HER2 0 by IHC Margin excision: Posterior margin: Scattered foci of high-grade DCIS, 0/2 lymph nodes negative, final margins negative  (10/02/2022: Initial biopsy was DCIS ER 30% PR 2%)   Treatment plan: Adjuvant chemotherapy with dose dense Adriamycin and Cytoxan followed by Taxol and carboplatin Adjuvant radiation therapy ------------------------------------------------- Current treatment: Dose dense Adriamycin and Cytoxan started January 01, 2023.  Today is cycle 2 Chemo toxicities: Fatigue Insomnia Anxiety  Return to clinic in 2 weeks for cycle 3  No orders of the defined types were placed in this encounter.  The patient has a good understanding of the overall plan. she agrees with it. she will call with any problems that may develop before the next visit here. Total time spent: 30 mins including face to face time and time spent for planning, charting and co-ordination of care   Tamsen Meek, MD 01/16/23   I Janan Ridge am acting as a Neurosurgeon for [employer]  I have reviewed the above documentation for accuracy and completeness, and I agree with the above.

## 2023-01-16 ENCOUNTER — Encounter: Payer: Self-pay | Admitting: Hematology and Oncology

## 2023-01-16 ENCOUNTER — Encounter: Payer: Self-pay | Admitting: *Deleted

## 2023-01-16 ENCOUNTER — Other Ambulatory Visit: Payer: Self-pay

## 2023-01-16 ENCOUNTER — Inpatient Hospital Stay (HOSPITAL_BASED_OUTPATIENT_CLINIC_OR_DEPARTMENT_OTHER): Payer: 59 | Admitting: Hematology and Oncology

## 2023-01-16 ENCOUNTER — Inpatient Hospital Stay: Payer: 59

## 2023-01-16 ENCOUNTER — Other Ambulatory Visit: Payer: 59

## 2023-01-16 ENCOUNTER — Inpatient Hospital Stay: Payer: 59 | Attending: Hematology and Oncology

## 2023-01-16 VITALS — BP 119/81 | HR 100

## 2023-01-16 VITALS — BP 137/83 | HR 96 | Temp 97.3°F | Resp 18 | Ht 66.0 in | Wt 212.7 lb

## 2023-01-16 DIAGNOSIS — R5383 Other fatigue: Secondary | ICD-10-CM | POA: Diagnosis not present

## 2023-01-16 DIAGNOSIS — Z5189 Encounter for other specified aftercare: Secondary | ICD-10-CM | POA: Insufficient documentation

## 2023-01-16 DIAGNOSIS — C50411 Malignant neoplasm of upper-outer quadrant of right female breast: Secondary | ICD-10-CM

## 2023-01-16 DIAGNOSIS — Z95828 Presence of other vascular implants and grafts: Secondary | ICD-10-CM

## 2023-01-16 DIAGNOSIS — Z171 Estrogen receptor negative status [ER-]: Secondary | ICD-10-CM | POA: Diagnosis not present

## 2023-01-16 DIAGNOSIS — G4700 Insomnia, unspecified: Secondary | ICD-10-CM | POA: Diagnosis not present

## 2023-01-16 DIAGNOSIS — D0511 Intraductal carcinoma in situ of right breast: Secondary | ICD-10-CM | POA: Insufficient documentation

## 2023-01-16 DIAGNOSIS — Z5111 Encounter for antineoplastic chemotherapy: Secondary | ICD-10-CM | POA: Diagnosis not present

## 2023-01-16 DIAGNOSIS — Z79899 Other long term (current) drug therapy: Secondary | ICD-10-CM | POA: Diagnosis not present

## 2023-01-16 DIAGNOSIS — F419 Anxiety disorder, unspecified: Secondary | ICD-10-CM | POA: Insufficient documentation

## 2023-01-16 LAB — CBC WITH DIFFERENTIAL (CANCER CENTER ONLY)
Abs Immature Granulocytes: 1.47 10*3/uL — ABNORMAL HIGH (ref 0.00–0.07)
Basophils Absolute: 0 10*3/uL (ref 0.0–0.1)
Basophils Relative: 0 %
Eosinophils Absolute: 0 10*3/uL (ref 0.0–0.5)
Eosinophils Relative: 0 %
HCT: 35.9 % — ABNORMAL LOW (ref 36.0–46.0)
Hemoglobin: 12.2 g/dL (ref 12.0–15.0)
Immature Granulocytes: 12 %
Lymphocytes Relative: 24 %
Lymphs Abs: 3.1 10*3/uL (ref 0.7–4.0)
MCH: 28.4 pg (ref 26.0–34.0)
MCHC: 34 g/dL (ref 30.0–36.0)
MCV: 83.5 fL (ref 80.0–100.0)
Monocytes Absolute: 0.7 10*3/uL (ref 0.1–1.0)
Monocytes Relative: 5 %
Neutro Abs: 7.5 10*3/uL (ref 1.7–7.7)
Neutrophils Relative %: 59 %
Platelet Count: 176 10*3/uL (ref 150–400)
RBC: 4.3 MIL/uL (ref 3.87–5.11)
RDW: 13.8 % (ref 11.5–15.5)
Smear Review: NORMAL
WBC Count: 12.8 10*3/uL — ABNORMAL HIGH (ref 4.0–10.5)
nRBC: 0.5 % — ABNORMAL HIGH (ref 0.0–0.2)

## 2023-01-16 LAB — CMP (CANCER CENTER ONLY)
ALT: 18 U/L (ref 0–44)
AST: 16 U/L (ref 15–41)
Albumin: 4 g/dL (ref 3.5–5.0)
Alkaline Phosphatase: 138 U/L — ABNORMAL HIGH (ref 38–126)
Anion gap: 7 (ref 5–15)
BUN: 8 mg/dL (ref 6–20)
CO2: 26 mmol/L (ref 22–32)
Calcium: 8.8 mg/dL — ABNORMAL LOW (ref 8.9–10.3)
Chloride: 106 mmol/L (ref 98–111)
Creatinine: 0.74 mg/dL (ref 0.44–1.00)
GFR, Estimated: >60 mL/min (ref >60)
Glucose, Bld: 117 mg/dL — ABNORMAL HIGH (ref 70–99)
Potassium: 3.7 mmol/L (ref 3.5–5.1)
Sodium: 139 mmol/L (ref 135–145)
Total Bilirubin: 0.2 mg/dL — ABNORMAL LOW (ref 0.3–1.2)
Total Protein: 7.1 g/dL (ref 6.5–8.1)

## 2023-01-16 MED ORDER — SODIUM CHLORIDE 0.9 % IV SOLN: Freq: Once | INTRAVENOUS | Status: AC

## 2023-01-16 MED ORDER — SODIUM CHLORIDE 0.9% FLUSH
10.0000 mL | INTRAVENOUS | Status: DC | PRN
  Administered 2023-01-16: 10 mL

## 2023-01-16 MED ORDER — SODIUM CHLORIDE 0.9 % IV SOLN: Freq: Once | INTRAVENOUS | Status: DC | PRN

## 2023-01-16 MED ORDER — SODIUM CHLORIDE 0.9 % IV SOLN
600.0000 mg/m2 | Freq: Once | INTRAVENOUS | Status: AC
  Administered 2023-01-16: 1260 mg via INTRAVENOUS
  Filled 2023-01-16: qty 63

## 2023-01-16 MED ORDER — SODIUM CHLORIDE 0.9 % IV SOLN
150.0000 mg | Freq: Once | INTRAVENOUS | Status: AC
  Administered 2023-01-16: 150 mg via INTRAVENOUS
  Filled 2023-01-16: qty 150

## 2023-01-16 MED ORDER — SODIUM CHLORIDE 0.9% FLUSH
10.0000 mL | Freq: Once | INTRAVENOUS | Status: AC
  Administered 2023-01-16: 10 mL

## 2023-01-16 MED ORDER — PALONOSETRON HCL INJECTION 0.25 MG/5ML
0.2500 mg | Freq: Once | INTRAVENOUS | Status: AC
  Administered 2023-01-16: 0.25 mg via INTRAVENOUS
  Filled 2023-01-16: qty 5

## 2023-01-16 MED ORDER — SODIUM CHLORIDE 0.9 % IV SOLN
10.0000 mg | Freq: Once | INTRAVENOUS | Status: AC
  Administered 2023-01-16: 10 mg via INTRAVENOUS
  Filled 2023-01-16: qty 10

## 2023-01-16 MED ORDER — FAMOTIDINE IN NACL 20-0.9 MG/50ML-% IV SOLN
20.0000 mg | Freq: Once | INTRAVENOUS | Status: AC | PRN
  Administered 2023-01-16: 20 mg via INTRAVENOUS

## 2023-01-16 MED ORDER — DOXORUBICIN HCL CHEMO IV INJECTION 2 MG/ML
60.0000 mg/m2 | Freq: Once | INTRAVENOUS | Status: AC
  Administered 2023-01-16: 126 mg via INTRAVENOUS
  Filled 2023-01-16: qty 63

## 2023-01-16 MED ORDER — HEPARIN SOD (PORK) LOCK FLUSH 100 UNIT/ML IV SOLN
500.0000 [IU] | Freq: Once | INTRAVENOUS | Status: AC | PRN
  Administered 2023-01-16: 500 [IU]

## 2023-01-16 MED ORDER — ZOLPIDEM TARTRATE 5 MG PO TABS: 5.0000 mg | ORAL_TABLET | Freq: Every evening | ORAL | 0 refills | Status: DC | PRN

## 2023-01-16 NOTE — Progress Notes (Signed)
Per laboratory tech, ANC 7.5 today.

## 2023-01-16 NOTE — Progress Notes (Signed)
Midway through Cytoxan infusion, pt c/o burning nose.  RN slowed rate from 684 mL/hr to 387 mL/hr.  After five minutes, pt had no relief from burning nose and c/o burning in chest, "like acid reflux."  Infusion paused, Daphane Shepherd, PA, at chairside.  Per PA, IV Pepcid and IVF given (see MAR for details).  Per PA, infusion was held until symptoms resolved, then restarted at a slower rate.  RN restarted infusion at 250 mL/hr 15 min after Pepcid administered, once Sx resolved.  Pt tolerated remainder of infusion without incident.  Melissa Gujrati, RPH, notified of rate change per protocol.  VSS at discharge, patient ambulated to lobby.

## 2023-01-16 NOTE — Assessment & Plan Note (Signed)
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, DCIS posterior margin positive, lymphovascular invasion not identified, ER 0%, PR 0%, Ki-67 85%, HER2 0 by IHC Margin excision: Posterior margin: Scattered foci of high-grade DCIS, 0/2 lymph nodes negative, final margins negative  (10/02/2022: Initial biopsy was DCIS ER 30% PR 2%)   Treatment plan: Adjuvant chemotherapy with dose dense Adriamycin and Cytoxan followed by Taxol and carboplatin Adjuvant radiation therapy ------------------------------------------------- Current treatment: Dose dense Adriamycin and Cytoxan started January 01, 2023.  Today is cycle 2 Chemo toxicities: Fatigue Insomnia Anxiety  Return to clinic in 2 weeks for cycle 3

## 2023-01-16 NOTE — Research (Unsigned)
W1093, ICE COMPRESS: RANDOMIZED TRIAL OF LIMB CRYOCOMPRESSION VERSUS CONTINUOUS COMPRESSION VERSUS LOW CYCLIC COMPRESSION FOR THE PREVENTION  OF TAXANE-INDUCED PERIPHERAL NEUROPATHY  Followed up with patient regarding above study.  She remembers discussing with research previously and we reviewed it again briefly together.  Patient reports history of intermittent numbness and tingling down her left arm and leg she thinks is due to muscle spasms.  Informed patient that people with a history neuropathy are not eligible for the study and I would need to clarify with MD if this is considered neuropathy.  Patient stated she was not interested in the study even if eligible due to not wanting to have her hands wrapped up during treatment. She wants to be able to use her hands during treatment.  Thanked patient for her time and talking with research nurse today.  Dr. Pamelia Hoit notified.   DCP-001: Use of a Clinical Trial Screening Tool to Address Cancer Health Disparities in the N[employer] Community Oncology Research Program [facility])   Introduced the above study explaining is it a one time demographic and data collection study for patients that were screened on NCI sponsored trials.  Informed patient that participation is voluntary and could be done while she is infusion.   Patient declined.  Thanked patient for talking with research nurse today.  Domenica Reamer, BSN, RN, [employer] Research Nurse II [phone] 01/16/2023 11:19 AM

## 2023-01-16 NOTE — Patient Instructions (Signed)
[facility]  Discharge Instructions: Thank you for choosing [facility] to provide your oncology and hematology care.   If you have a lab appointment with the [facility], please go directly to the [facility] and check in at the registration area.   Wear comfortable clothing and clothing appropriate for easy access to any Portacath or PICC line.   We strive to give you quality time with your provider. You may need to reschedule your appointment if you arrive late (15 or more minutes).  Arriving late affects you and other patients whose appointments are after yours.  Also, if you miss three or more appointments without notifying the office, you may be dismissed from the clinic at the provider's discretion.      For prescription refill requests, have your pharmacy contact our office and allow 72 hours for refills to be completed.    Today you received the following chemotherapy and/or immunotherapy agents: Doxorubicin, Cyclophosphamide      To help prevent nausea and vomiting after your treatment, we encourage you to take your nausea medication as directed.  BELOW ARE SYMPTOMS THAT SHOULD BE REPORTED IMMEDIATELY: *FEVER GREATER THAN 100.4 F (38 C) OR HIGHER *CHILLS OR SWEATING *NAUSEA AND VOMITING THAT IS NOT CONTROLLED WITH YOUR NAUSEA MEDICATION *UNUSUAL SHORTNESS OF BREATH *UNUSUAL BRUISING OR BLEEDING *URINARY PROBLEMS (pain or burning when urinating, or frequent urination) *BOWEL PROBLEMS (unusual diarrhea, constipation, pain near the anus) TENDERNESS IN MOUTH AND THROAT WITH OR WITHOUT PRESENCE OF ULCERS (sore throat, sores in mouth, or a toothache) UNUSUAL RASH, SWELLING OR PAIN  UNUSUAL VAGINAL DISCHARGE OR ITCHING   Items with * indicate a potential emergency and should be followed up as soon as possible or go to the Emergency Department if any problems should occur.  Please show the CHEMOTHERAPY ALERT CARD or IMMUNOTHERAPY  ALERT CARD at check-in to the Emergency Department and triage nurse.  Should you have questions after your visit or need to cancel or reschedule your appointment, please contact [facility]  Dept: [phone]  and follow the prompts.  Office hours are 8:00 a.m. to 4:30 p.m. Monday - Friday. Please note that voicemails left after 4:00 p.m. may not be returned until the following business day.  We are closed weekends and major holidays. You have access to a nurse at all times for urgent questions. Please call the main number to the clinic Dept: [phone] and follow the prompts.   For any non-urgent questions, you may also contact your provider using MyChart. We now offer e-Visits for anyone 21 and older to request care online for non-urgent symptoms. For details visit [email or web address].   Also download the MyChart app! Go to the app store, search "MyChart", open the app, select [facility], and log in with your MyChart username and password.

## 2023-01-17 NOTE — Patient Outreach (Signed)
  Care Coordination   Initial Visit Note   01/15/2023 Name: Laurie Bell MRN: [id number] DOB: [birth year]  Laurie Bell is a 42 y.o. year old female who sees Hoy Register, MD for primary care. I spoke with  [name] by phone today.  What matters to the patients health and wellness today?  Finances and Symptom Management    Goals Addressed             This Visit's Progress    Obtain Supportive Resources-Finances   On track    Activities and task to complete in order to accomplish goals.   Keep all upcoming appointments discussed today Continue with compliance of taking medication prescribed by Doctor Implement healthy coping skills discussed to assist with management of symptoms Follow up with Department of Social Services          SDOH assessments and interventions completed:  Yes     Care Coordination Interventions:  Yes, provided  Interventions Today    Flowsheet Row Most Recent Value  Chronic Disease   Chronic disease during today's visit Other  [MDD, GAD, Malignant neoplasm of breast]  General Interventions   General Interventions Discussed/Reviewed General Interventions Discussed, Doctor Visits, [employer] reports out of pocket costs for medical care. Currently not working while undergoing tx with no Short/Long term disability]  Doctor Visits Discussed/Reviewed Doctor Visits Discussed  Mental Health Interventions   Mental Health Discussed/Reviewed Mental Health Discussed, Coping Strategies, Anxiety, Depression  [Pt endorses panic attacks and insomnia with ineffectiveness of melatonin or OTC meds (Tylenol PM/Benedryl) Praying and breathing exercises utilized to assist with managment of symptoms. Encouraged to implement self-care]  Nutrition Interventions   Nutrition Discussed/Reviewed Nutrition Discussed  Pharmacy Interventions   Pharmacy Dicussed/Reviewed Pharmacy Topics Discussed, Medication Adherence  Safety Interventions    Safety Discussed/Reviewed Safety Discussed       Follow up plan: Follow up call scheduled for 2-4 weeks    Encounter Outcome:  Pt. Visit Completed   Jenel Lucks, MSW, LCSW [facility] Care Management [facility]  [facility] [email or web address] Phone [phone] 11:02 PM \

## 2023-01-17 NOTE — Patient Instructions (Signed)
Visit Information  Thank you for taking time to visit with me today. Please don't hesitate to contact me if I can be of assistance to you.   Following are the goals we discussed today:   Goals Addressed             This Visit's Progress    Obtain Supportive Resources-Finances   On track    Activities and task to complete in order to accomplish goals.   Keep all upcoming appointments discussed today Continue with compliance of taking medication prescribed by Doctor Implement healthy coping skills discussed to assist with management of symptoms Follow up with Department of Social Services          Our next appointment is by telephone on 08/19 at 10 AM  Please call the care guide team at [phone] if you need to cancel or reschedule your appointment.   If you are experiencing a Mental Health or Behavioral Health Crisis or need someone to talk to, please call the Suicide and Crisis Lifeline: 988 call 911   Patient verbalizes understanding of instructions and care plan provided today and agrees to view in MyChart. Active MyChart status and patient understanding of how to access instructions and care plan via MyChart confirmed with patient.     Jenel Lucks, MSW, LCSW [facility] Care Management [place]  [facility] [email or web address] Phone [phone] 11:04 PM

## 2023-01-18 ENCOUNTER — Inpatient Hospital Stay: Payer: 59

## 2023-01-18 ENCOUNTER — Other Ambulatory Visit: Payer: Self-pay

## 2023-01-18 VITALS — BP 113/75 | HR 86 | Temp 98.0°F | Resp 18

## 2023-01-18 DIAGNOSIS — Z5111 Encounter for antineoplastic chemotherapy: Secondary | ICD-10-CM | POA: Diagnosis not present

## 2023-01-18 DIAGNOSIS — G4700 Insomnia, unspecified: Secondary | ICD-10-CM | POA: Diagnosis not present

## 2023-01-18 DIAGNOSIS — F419 Anxiety disorder, unspecified: Secondary | ICD-10-CM | POA: Diagnosis not present

## 2023-01-18 DIAGNOSIS — R5383 Other fatigue: Secondary | ICD-10-CM | POA: Diagnosis not present

## 2023-01-18 DIAGNOSIS — D0511 Intraductal carcinoma in situ of right breast: Secondary | ICD-10-CM | POA: Diagnosis not present

## 2023-01-18 DIAGNOSIS — Z5189 Encounter for other specified aftercare: Secondary | ICD-10-CM | POA: Diagnosis not present

## 2023-01-18 DIAGNOSIS — Z79899 Other long term (current) drug therapy: Secondary | ICD-10-CM | POA: Diagnosis not present

## 2023-01-18 DIAGNOSIS — Z171 Estrogen receptor negative status [ER-]: Secondary | ICD-10-CM

## 2023-01-18 MED ORDER — PEGFILGRASTIM-JMDB 6 MG/0.6ML ~~LOC~~ SOSY
6.0000 mg | PREFILLED_SYRINGE | Freq: Once | SUBCUTANEOUS | Status: AC
  Administered 2023-01-18: 6 mg via SUBCUTANEOUS
  Filled 2023-01-18: qty 0.6

## 2023-01-19 ENCOUNTER — Other Ambulatory Visit: Payer: Self-pay | Admitting: Family Medicine

## 2023-01-19 ENCOUNTER — Encounter: Payer: Self-pay | Admitting: Hematology and Oncology

## 2023-01-19 ENCOUNTER — Inpatient Hospital Stay: Payer: 59

## 2023-01-19 DIAGNOSIS — R519 Headache, unspecified: Secondary | ICD-10-CM

## 2023-01-19 NOTE — Progress Notes (Signed)
[facility] CSW Progress Note  Clinical Child psychotherapist contacted patient by phone to follow up per nurse navigator request. Patient stated reason for call was questions about submitting financial documents to the [employer] and questions about financial support.   Interventions: CSW sent email to [employer] to assist in gathering information on financial documentation.  CSW confirmed that patient has already applied and received [employer]. CSW referred patient to [employer], and faxed off medical documentation.   Next Steps:  CSW will wait to hear back from the pink fund.  Marguerita Merles, LCSWA Clinical Social Worker [facility]

## 2023-01-21 ENCOUNTER — Inpatient Hospital Stay (HOSPITAL_COMMUNITY)
Admission: EM | Admit: 2023-01-21 | Discharge: 2023-01-23 | DRG: 158 | Disposition: A | Discharge status: Home / Self Care | Payer: 59 | Attending: Internal Medicine | Admitting: Internal Medicine

## 2023-01-21 ENCOUNTER — Emergency Department (HOSPITAL_COMMUNITY): Payer: 59

## 2023-01-21 ENCOUNTER — Other Ambulatory Visit: Payer: Self-pay

## 2023-01-21 DIAGNOSIS — Z803 Family history of malignant neoplasm of breast: Secondary | ICD-10-CM

## 2023-01-21 DIAGNOSIS — Z171 Estrogen receptor negative status [ER-]: Secondary | ICD-10-CM

## 2023-01-21 DIAGNOSIS — M1990 Unspecified osteoarthritis, unspecified site: Secondary | ICD-10-CM | POA: Diagnosis present

## 2023-01-21 DIAGNOSIS — M79651 Pain in right thigh: Secondary | ICD-10-CM

## 2023-01-21 DIAGNOSIS — E0590 Thyrotoxicosis, unspecified without thyrotoxic crisis or storm: Secondary | ICD-10-CM | POA: Diagnosis present

## 2023-01-21 DIAGNOSIS — B370 Candidal stomatitis: Secondary | ICD-10-CM | POA: Diagnosis present

## 2023-01-21 DIAGNOSIS — G4700 Insomnia, unspecified: Secondary | ICD-10-CM | POA: Diagnosis present

## 2023-01-21 DIAGNOSIS — K1231 Oral mucositis (ulcerative) due to antineoplastic therapy: Principal | ICD-10-CM | POA: Diagnosis present

## 2023-01-21 DIAGNOSIS — F419 Anxiety disorder, unspecified: Secondary | ICD-10-CM | POA: Diagnosis present

## 2023-01-21 DIAGNOSIS — R208 Other disturbances of skin sensation: Secondary | ICD-10-CM | POA: Diagnosis present

## 2023-01-21 DIAGNOSIS — R079 Chest pain, unspecified: Secondary | ICD-10-CM | POA: Diagnosis present

## 2023-01-21 DIAGNOSIS — T458X5A Adverse effect of other primarily systemic and hematological agents, initial encounter: Secondary | ICD-10-CM | POA: Diagnosis present

## 2023-01-21 DIAGNOSIS — E876 Hypokalemia: Secondary | ICD-10-CM | POA: Diagnosis not present

## 2023-01-21 DIAGNOSIS — C50411 Malignant neoplasm of upper-outer quadrant of right female breast: Secondary | ICD-10-CM | POA: Diagnosis not present

## 2023-01-21 DIAGNOSIS — R6510 Systemic inflammatory response syndrome (SIRS) of non-infectious origin without acute organ dysfunction: Secondary | ICD-10-CM | POA: Diagnosis present

## 2023-01-21 DIAGNOSIS — E872 Acidosis, unspecified: Secondary | ICD-10-CM | POA: Diagnosis present

## 2023-01-21 DIAGNOSIS — Z8249 Family history of ischemic heart disease and other diseases of the circulatory system: Secondary | ICD-10-CM

## 2023-01-21 DIAGNOSIS — M25561 Pain in right knee: Secondary | ICD-10-CM | POA: Diagnosis present

## 2023-01-21 DIAGNOSIS — D72829 Elevated white blood cell count, unspecified: Secondary | ICD-10-CM | POA: Diagnosis present

## 2023-01-21 DIAGNOSIS — M7989 Other specified soft tissue disorders: Secondary | ICD-10-CM | POA: Diagnosis not present

## 2023-01-21 DIAGNOSIS — M25461 Effusion, right knee: Secondary | ICD-10-CM | POA: Diagnosis present

## 2023-01-21 DIAGNOSIS — T451X5A Adverse effect of antineoplastic and immunosuppressive drugs, initial encounter: Secondary | ICD-10-CM | POA: Diagnosis present

## 2023-01-21 DIAGNOSIS — M898X8 Other specified disorders of bone, other site: Secondary | ICD-10-CM | POA: Diagnosis not present

## 2023-01-21 DIAGNOSIS — K219 Gastro-esophageal reflux disease without esophagitis: Secondary | ICD-10-CM | POA: Diagnosis present

## 2023-01-21 DIAGNOSIS — E669 Obesity, unspecified: Secondary | ICD-10-CM | POA: Diagnosis present

## 2023-01-21 DIAGNOSIS — R0789 Other chest pain: Secondary | ICD-10-CM | POA: Diagnosis not present

## 2023-01-21 DIAGNOSIS — R109 Unspecified abdominal pain: Secondary | ICD-10-CM | POA: Diagnosis not present

## 2023-01-21 DIAGNOSIS — Z6834 Body mass index (BMI) 34.0-34.9, adult: Secondary | ICD-10-CM

## 2023-01-21 DIAGNOSIS — Z91040 Latex allergy status: Secondary | ICD-10-CM

## 2023-01-21 DIAGNOSIS — Z853 Personal history of malignant neoplasm of breast: Secondary | ICD-10-CM

## 2023-01-21 DIAGNOSIS — Z885 Allergy status to narcotic agent status: Secondary | ICD-10-CM

## 2023-01-21 DIAGNOSIS — M79604 Pain in right leg: Secondary | ICD-10-CM | POA: Diagnosis not present

## 2023-01-21 DIAGNOSIS — Z800 Family history of malignant neoplasm of digestive organs: Secondary | ICD-10-CM

## 2023-01-21 DIAGNOSIS — Z79899 Other long term (current) drug therapy: Secondary | ICD-10-CM

## 2023-01-21 DIAGNOSIS — R000 Tachycardia, unspecified: Secondary | ICD-10-CM | POA: Diagnosis present

## 2023-01-21 DIAGNOSIS — Z95828 Presence of other vascular implants and grafts: Secondary | ICD-10-CM

## 2023-01-21 DIAGNOSIS — Z9889 Other specified postprocedural states: Secondary | ICD-10-CM

## 2023-01-21 LAB — CBC
HCT: 36.9 % (ref 36.0–46.0)
Hemoglobin: 12.3 g/dL (ref 12.0–15.0)
MCH: 28.8 pg (ref 26.0–34.0)
MCHC: 33.3 g/dL (ref 30.0–36.0)
MCV: 86.4 fL (ref 80.0–100.0)
Platelets: 304 10*3/uL (ref 150–400)
RBC: 4.27 MIL/uL (ref 3.87–5.11)
RDW: 14.4 % (ref 11.5–15.5)
WBC: 29.8 10*3/uL — ABNORMAL HIGH (ref 4.0–10.5)
nRBC: 0 % (ref 0.0–0.2)

## 2023-01-21 LAB — COMPREHENSIVE METABOLIC PANEL
ALT: 17 U/L (ref 0–44)
AST: 14 U/L — ABNORMAL LOW (ref 15–41)
Albumin: 3.8 g/dL (ref 3.5–5.0)
Alkaline Phosphatase: 179 U/L — ABNORMAL HIGH (ref 38–126)
Anion gap: 7 (ref 5–15)
BUN: 12 mg/dL (ref 6–20)
CO2: 27 mmol/L (ref 22–32)
Calcium: 9 mg/dL (ref 8.9–10.3)
Chloride: 101 mmol/L (ref 98–111)
Creatinine, Ser: 0.79 mg/dL (ref 0.44–1.00)
GFR, Estimated: >60 mL/min (ref >60)
Glucose, Bld: 111 mg/dL — ABNORMAL HIGH (ref 70–99)
Potassium: 3.4 mmol/L — ABNORMAL LOW (ref 3.5–5.1)
Sodium: 135 mmol/L (ref 135–145)
Total Bilirubin: 0.5 mg/dL (ref 0.3–1.2)
Total Protein: 7.3 g/dL (ref 6.5–8.1)

## 2023-01-21 LAB — HCG, SERUM, QUALITATIVE: Preg, Serum: NEGATIVE

## 2023-01-21 LAB — LACTIC ACID, PLASMA
Lactic Acid, Venous: 2.1 mmol/L (ref 0.5–1.9)
Lactic Acid, Venous: 2.1 mmol/L (ref 0.5–1.9)

## 2023-01-21 LAB — HCG, QUANTITATIVE, PREGNANCY: hCG, Beta Chain, Quant, S: <1 m[IU]/mL (ref <5)

## 2023-01-21 LAB — TROPONIN I (HIGH SENSITIVITY): Troponin I (High Sensitivity): 6 ng/L (ref <18)

## 2023-01-21 MED ORDER — SODIUM CHLORIDE 0.9 % IV SOLN
2.0000 g | Freq: Once | INTRAVENOUS | Status: AC
  Administered 2023-01-21: 2 g via INTRAVENOUS
  Filled 2023-01-21: qty 12.5

## 2023-01-21 MED ORDER — LACTATED RINGERS IV SOLN: INTRAVENOUS | Status: DC

## 2023-01-21 MED ORDER — IOHEXOL 350 MG/ML SOLN
75.0000 mL | Freq: Once | INTRAVENOUS | Status: AC | PRN
  Administered 2023-01-21: 75 mL via INTRAVENOUS

## 2023-01-21 MED ORDER — VANCOMYCIN HCL 2000 MG/400ML IV SOLN
2000.0000 mg | Freq: Once | INTRAVENOUS | Status: AC
  Administered 2023-01-21: 2000 mg via INTRAVENOUS
  Filled 2023-01-21: qty 400

## 2023-01-21 MED ORDER — SODIUM CHLORIDE 0.9 % IV BOLUS
1000.0000 mL | Freq: Once | INTRAVENOUS | Status: AC
  Administered 2023-01-21: 1000 mL via INTRAVENOUS

## 2023-01-21 MED ORDER — METRONIDAZOLE 500 MG/100ML IV SOLN
500.0000 mg | Freq: Once | INTRAVENOUS | Status: AC
  Administered 2023-01-21: 500 mg via INTRAVENOUS
  Filled 2023-01-21: qty 100

## 2023-01-21 MED ORDER — PANTOPRAZOLE SODIUM 40 MG IV SOLR
40.0000 mg | Freq: Once | INTRAVENOUS | Status: AC
  Administered 2023-01-21: 40 mg via INTRAVENOUS
  Filled 2023-01-21: qty 10

## 2023-01-21 MED ORDER — VANCOMYCIN HCL IN DEXTROSE 1-5 GM/200ML-% IV SOLN: 1000.0000 mg | Freq: Once | INTRAVENOUS | Status: DC

## 2023-01-21 NOTE — ED Provider Notes (Signed)
[place] EMERGENCY DEPARTMENT AT [facility] Provider Note   CSN: [id number] Arrival date & time: 01/21/23  1735     History  Chief Complaint  Patient presents with   Chest Pain   Leg Swelling   port pain    Laurie Bell is a 42 y.o. female, history of breast cancer, tachycardia, who presents to the ED secondary to chest pain, has been going on for the last 5 days, after receiving her chemotherapy per her report.  She states that she has some left dull chest pain, that is burning in sensation, and dull at times.  She originally noticed it when she was getting her chemotherapy, but it has been intermittent since then.  States it feels just uncomfortable, and tender around her port.  Port was placed about 6 weeks ago.  Denies any fevers or chills.  No rash.  Notes that she has chest pain, with associated shortness of breath at times.  Feels like she is having heart rates as well.  Is not on Eliquis or blood thinners.  Patient here also for right thigh/leg pain, has been going on for the last 4 days.  She feels like her right thigh is swollen, and tender to light touch.  She denies any rash, redness, trauma to the area.  Notes that her oncologist told her to come to the ER, to rule out a blood clot.     Home Medications Prior to Admission medications   Medication Sig Start Date End Date Taking? Authorizing Provider  acetaminophen (TYLENOL) 500 MG tablet Take 1,000 mg by mouth every 6 (six) hours as needed for moderate pain.   Yes [provider]  albuterol (VENTOLIN HFA) 108 (90 Base) MCG/ACT inhaler TAKE 2 PUFFS BY MOUTH EVERY 6 HOURS AS NEEDED FOR WHEEZE OR SHORTNESS OF BREATH 12/22/22  Yes Newlin, Enobong, MD  cyclobenzaprine (FLEXERIL) 10 MG tablet TAKE 1 TABLET BY MOUTH AT BEDTIME AS NEEDED FOR MUSCLE SPASMS 10/31/22  Yes Kirsteins, Victorino Sparrow, MD  dexamethasone (DECADRON) 4 MG tablet Take 1 tablet day after chemo and 1 tablet 2 days after chemo with food 12/12/22   Yes Serena Croissant, MD  DULoxetine (CYMBALTA) 60 MG capsule Take 1 capsule (60 mg total) by mouth daily. 11/29/22  Yes Hoy Register, MD  esomeprazole (NEXIUM) 40 MG capsule TAKE 1 CAPSULE BY MOUTH EVERY MORNING AND AT BEDTIME 12/19/22  Yes Imogene Burn, MD  fluticasone [facility]) 50 MCG/ACT nasal spray Place 2 sprays into both nostrils daily. 12/30/21  Yes Lurline Idol, FNP  gabapentin (NEURONTIN) 400 MG capsule Take 400 mg by mouth 3 (three) times daily as needed (pain).   Yes [provider]  hydrOXYzine (ATARAX) 25 MG tablet TAKE 1 TABLET(25 MG) BY MOUTH THREE TIMES DAILY 11/29/22  Yes Newlin, Enobong, MD  lidocaine (XYLOCAINE) 2 % solution Use as directed 15 mLs in the mouth or throat every 4 (four) hours as needed for mouth pain. 01/10/23  Yes [provider]  linaclotide Karlene Einstein) 145 MCG CAPS capsule Take 1 capsule (145 mcg total) by mouth daily before breakfast. 05/19/22  Yes Imogene Burn, MD  methimazole (TAPAZOLE) 5 MG tablet Take 1 tablet (5 mg total) by mouth as directed. 1 tablet Monday through Friday, skip Saturdays and  Sundays 09/27/22  Yes Shamleffer, Konrad Dolores, MD  metoprolol succinate (TOPROL-XL) 25 MG 24 hr tablet Take 0.5 tablets (12.5 mg total) by mouth daily. For increased heart rate 11/29/22  Yes Hoy Register, MD  naproxen (NAPROSYN) 500 MG tablet TAKE 1 TABLET BY MOUTH TWICE A DAY AS NEEDED FOR PAIN 01/19/23  Yes Newlin, Enobong, MD  ondansetron (ZOFRAN) 8 MG tablet Take 1 tab (8 mg) by mouth every 8 hrs as needed for nausea/vomiting. Start third day after doxorubicin/cyclophosphamide chemotherapy. 12/12/22  Yes Serena Croissant, MD  potassium chloride (KLOR-CON) 10 MEQ tablet Take 10 mEq by mouth daily. 08/08/22  Yes [provider]  prochlorperazine (COMPAZINE) 10 MG tablet Take 1 tablet (10 mg total) by mouth every 6 (six) hours as needed for nausea or vomiting. 12/12/22  Yes Serena Croissant, MD  zolpidem (AMBIEN) 5 MG tablet Take 1 tablet (5 mg  total) by mouth at bedtime as needed for sleep. 01/16/23  Yes Serena Croissant, MD  ipratropium (ATROVENT) 0.03 % nasal spray Place 2 sprays into both nostrils every 12 (twelve) hours. Patient not taking: Reported on 01/21/2023 12/20/22   Roxy Horseman, PA-C  medroxyPROGESTERone (PROVERA) 10 MG tablet Take 2 tablets by mouth daily Patient not taking: Reported on 06/05/2019 02/06/18 02/02/20  Conan Bowens, MD      Allergies    Latex    Review of Systems   Review of Systems  Respiratory:  Positive for shortness of breath.   Cardiovascular:  Positive for chest pain and leg swelling.  Gastrointestinal:  Negative for abdominal pain.    Physical Exam Updated Vital Signs BP 118/83   Pulse 100   Temp 98 F (36.7 C) (Oral)   Resp 19   Ht 5\' 6"  (1.676 m)   Wt 91.6 kg   SpO2 100%   BMI 32.60 kg/m  Physical Exam Vitals and nursing note reviewed.  Constitutional:      General: She is not in acute distress.    Appearance: She is well-developed.  HENT:     Head: Normocephalic and atraumatic.  Eyes:     Conjunctiva/sclera: Conjunctivae normal.  Cardiovascular:     Rate and Rhythm: Normal rate and regular rhythm.     Heart sounds: No murmur heard. Pulmonary:     Effort: Pulmonary effort is normal. No respiratory distress.     Breath sounds: Normal breath sounds.  Abdominal:     Palpations: Abdomen is soft.     Tenderness: There is no abdominal tenderness.  Musculoskeletal:        General: No swelling.     Cervical back: Neck supple.     Comments: Right Knee: Tenderness to palpation of medial and lateral joint lines. An effusion is not present.  Negative anterior and posterior drawer. Negative Mcmurray's. +Patellar stability. Negative valgus and varus stress test.. Extension and flexion intact. No sensory deficits. TTP of femur. No evident swelling or erythema.   Skin:    General: Skin is warm and dry.     Capillary Refill: Capillary refill takes less than 2 seconds.     Comments: No  overlying erythema of RLE.  Neurological:     Mental Status: She is alert.  Psychiatric:        Mood and Affect: Mood normal.     ED Results / Procedures / Treatments   Labs (all labs ordered are listed, but only abnormal results are displayed) Labs Reviewed  CBC - Abnormal; Notable for the following components:      Result Value   WBC 29.8 (*)    All other components within normal limits  COMPREHENSIVE METABOLIC PANEL - Abnormal; Notable for the following components:   Potassium 3.4 (*)  Glucose, Bld 111 (*)    AST 14 (*)    Alkaline Phosphatase 179 (*)    All other components within normal limits  LACTIC ACID, PLASMA - Abnormal; Notable for the following components:   Lactic Acid, Venous 2.1 (*)    All other components within normal limits  LACTIC ACID, PLASMA - Abnormal; Notable for the following components:   Lactic Acid, Venous 2.1 (*)    All other components within normal limits  CULTURE, BLOOD (ROUTINE X 2)  CULTURE, BLOOD (ROUTINE X 2)  HCG, SERUM, QUALITATIVE  HCG, QUANTITATIVE, PREGNANCY  URINALYSIS, ROUTINE W REFLEX MICROSCOPIC  TROPONIN I (HIGH SENSITIVITY)  TROPONIN I (HIGH SENSITIVITY)    EKG EKG Interpretation Date/Time:  Sunday January 21 2023 18:29:44 EDT Ventricular Rate:  107 PR Interval:  160 QRS Duration:  58 QT Interval:  363 QTC Calculation: 485 R Axis:   28  Text Interpretation: Sinus tachycardia Low voltage, precordial leads Anteroseptal infarct, old When compared with ECG of 12/19/2022, No significant change was found Confirmed by Dione Booze ([id number]) on 01/21/2023 10:41:37 PM  Radiology CT Angio Chest PE W and/or Wo Contrast  Result Date: 01/21/2023 CLINICAL DATA:  Chest and abdominal pain following chemotherapy 5 days ago, initial encounter EXAM: CT ANGIOGRAPHY CHEST CT ABDOMEN AND PELVIS WITH CONTRAST TECHNIQUE: Multidetector CT imaging of the chest was performed using the standard protocol during bolus administration of intravenous  contrast. Multiplanar CT image reconstructions and MIPs were obtained to evaluate the vascular anatomy. Multidetector CT imaging of the abdomen and pelvis was performed using the standard protocol during bolus administration of intravenous contrast. RADIATION DOSE REDUCTION: This exam was performed according to the departmental dose-optimization program which includes automated exposure control, adjustment of the mA and/or kV according to patient size and/or use of iterative reconstruction technique. CONTRAST:  75mL OMNIPAQUE IOHEXOL 350 MG/ML SOLN COMPARISON:  None Available. FINDINGS: CTA CHEST FINDINGS Cardiovascular: Thoracic aorta shows no aneurysmal dilatation or dissection. No cardiac enlargement is seen. The pulmonary artery shows a normal branching pattern bilaterally. No filling defect to suggest pulmonary embolism is noted. No coronary calcifications are seen. Left chest wall port is noted in satisfactory position. No significant inflammatory changes are noted surrounding the port. Mediastinum/Nodes: Thoracic inlet is within normal limits. No hilar or mediastinal adenopathy is noted. The esophagus as visualized is within normal limits. Lungs/Pleura: Lungs are well aerated bilaterally. No focal infiltrate or effusion is seen. Few  nodular densities are noted adjacent to the major fissure on the left consistent with Peri fissural lymph nodes. Musculoskeletal: Postsurgical changes are noted in the right breast. No acute bony abnormality is noted. Review of the MIP images confirms the above findings. CT ABDOMEN and PELVIS FINDINGS Hepatobiliary: No focal liver abnormality is seen. No gallstones, gallbladder wall thickening, or biliary dilatation. Pancreas: Unremarkable. No pancreatic ductal dilatation or surrounding inflammatory changes. Spleen: Normal in size without focal abnormality. Adrenals/Urinary Tract: Adrenal glands are within normal limits. Kidneys demonstrate a normal enhancement pattern  bilaterally. The bladder is within normal limits. Stomach/Bowel: No obstructive or inflammatory changes of the colon are seen. The appendix is within normal limits.  bowel and stomach are unremarkable. Vascular/Lymphatic: No significant vascular findings are present. No enlarged abdominal or pelvic lymph nodes. Incidental note is made of a left retroaortic renal vein. Reproductive: Uterus and bilateral adnexa are unremarkable. Other: No abdominal wall hernia or abnormality. No abdominopelvic ascites. Musculoskeletal: Stable sclerotic lesions consistent with bone islands are noted. No acute bony abnormality is seen.  Review of the MIP images confirms the above findings. IMPRESSION: CTA of the chest: No evidence of pulmonary emboli. No significant inflammatory changes surrounding the known left chest wall port are seen. CT of the abdomen and pelvis: No acute abnormality noted. Electronically Signed   By: Alcide Clever M.D.   On: 01/21/2023 21:32   CT ABDOMEN PELVIS W CONTRAST  Result Date: 01/21/2023 CLINICAL DATA:  Chest and abdominal pain following chemotherapy 5 days ago, initial encounter EXAM: CT ANGIOGRAPHY CHEST CT ABDOMEN AND PELVIS WITH CONTRAST TECHNIQUE: Multidetector CT imaging of the chest was performed using the standard protocol during bolus administration of intravenous contrast. Multiplanar CT image reconstructions and MIPs were obtained to evaluate the vascular anatomy. Multidetector CT imaging of the abdomen and pelvis was performed using the standard protocol during bolus administration of intravenous contrast. RADIATION DOSE REDUCTION: This exam was performed according to the departmental dose-optimization program which includes automated exposure control, adjustment of the mA and/or kV according to patient size and/or use of iterative reconstruction technique. CONTRAST:  75mL OMNIPAQUE IOHEXOL 350 MG/ML SOLN COMPARISON:  None Available. FINDINGS: CTA CHEST FINDINGS Cardiovascular: Thoracic  aorta shows no aneurysmal dilatation or dissection. No cardiac enlargement is seen. The pulmonary artery shows a normal branching pattern bilaterally. No filling defect to suggest pulmonary embolism is noted. No coronary calcifications are seen. Left chest wall port is noted in satisfactory position. No significant inflammatory changes are noted surrounding the port. Mediastinum/Nodes: Thoracic inlet is within normal limits. No hilar or mediastinal adenopathy is noted. The esophagus as visualized is within normal limits. Lungs/Pleura: Lungs are well aerated bilaterally. No focal infiltrate or effusion is seen. Few  nodular densities are noted adjacent to the major fissure on the left consistent with Peri fissural lymph nodes. Musculoskeletal: Postsurgical changes are noted in the right breast. No acute bony abnormality is noted. Review of the MIP images confirms the above findings. CT ABDOMEN and PELVIS FINDINGS Hepatobiliary: No focal liver abnormality is seen. No gallstones, gallbladder wall thickening, or biliary dilatation. Pancreas: Unremarkable. No pancreatic ductal dilatation or surrounding inflammatory changes. Spleen: Normal in size without focal abnormality. Adrenals/Urinary Tract: Adrenal glands are within normal limits. Kidneys demonstrate a normal enhancement pattern bilaterally. The bladder is within normal limits. Stomach/Bowel: No obstructive or inflammatory changes of the colon are seen. The appendix is within normal limits.  bowel and stomach are unremarkable. Vascular/Lymphatic: No significant vascular findings are present. No enlarged abdominal or pelvic lymph nodes. Incidental note is made of a left retroaortic renal vein. Reproductive: Uterus and bilateral adnexa are unremarkable. Other: No abdominal wall hernia or abnormality. No abdominopelvic ascites. Musculoskeletal: Stable sclerotic lesions consistent with bone islands are noted. No acute bony abnormality is seen. Review of the  MIP images confirms the above findings. IMPRESSION: CTA of the chest: No evidence of pulmonary emboli. No significant inflammatory changes surrounding the known left chest wall port are seen. CT of the abdomen and pelvis: No acute abnormality noted. Electronically Signed   By: Alcide Clever M.D.   On: 01/21/2023 21:32   DG Chest Port 1 View  Result Date: 01/21/2023 CLINICAL DATA:  ON CHEMOTHERAPY, WITH RIGHT THIGH PAIN AND SWELLING, RIGHT KNEE PAIN, AND CHEST PAIN AT PORT SITE. [id number]. EXAM: PORTABLE CHEST 1 VIEW CR- RIGHT FEMUR 2 VIEWS CR- RIGHT KNEE - COMPLETE 4+ VIEW COMPARISON:  PA Lat chest 11/25/2022. No prior right femur or right knee films. FINDINGS: Chest AP portable, 7:35 p.m.: The heart size and mediastinal contours are within  normal limits. Both lungs are clear. The visualized skeletal structures are unremarkable. There is new demonstration of a left chest port, with subclavian approach catheter terminating just above the superior cavoatrial junction. Right femur, AP Lat exam, total of 4 films: There is no evidence of fracture, dislocation or other focal bone abnormality. The right pelvic ring is intact. Arthritic changes are not seen. Soft tissues are unremarkable. Right knee, routine four views: There is no evidence of fracture, dislocation, or joint effusion. No evidence of arthropathy or other focal bone abnormality. Soft tissues are unremarkable. A bone island is incidentally seen in the proximal tibial metaphysis. IMPRESSION: 1. No evidence of acute chest disease. Left chest port. 2. No evidence of acute right femur or right knee fracture or other acute process. Electronically Signed   By: Almira Bar M.D.   On: 01/21/2023 20:09   DG Knee Complete 4 Views Right  Result Date: 01/21/2023 CLINICAL DATA:  ON CHEMOTHERAPY, WITH RIGHT THIGH PAIN AND SWELLING, RIGHT KNEE PAIN, AND CHEST PAIN AT PORT SITE. [id number]. EXAM: PORTABLE CHEST 1 VIEW CR- RIGHT FEMUR 2 VIEWS CR- RIGHT KNEE - COMPLETE 4+  VIEW COMPARISON:  PA Lat chest 11/25/2022. No prior right femur or right knee films. FINDINGS: Chest AP portable, 7:35 p.m.: The heart size and mediastinal contours are within normal limits. Both lungs are clear. The visualized skeletal structures are unremarkable. There is new demonstration of a left chest port, with subclavian approach catheter terminating just above the superior cavoatrial junction. Right femur, AP Lat exam, total of 4 films: There is no evidence of fracture, dislocation or other focal bone abnormality. The right pelvic ring is intact. Arthritic changes are not seen. Soft tissues are unremarkable. Right knee, routine four views: There is no evidence of fracture, dislocation, or joint effusion. No evidence of arthropathy or other focal bone abnormality. Soft tissues are unremarkable. A bone island is incidentally seen in the proximal tibial metaphysis. IMPRESSION: 1. No evidence of acute chest disease. Left chest port. 2. No evidence of acute right femur or right knee fracture or other acute process. Electronically Signed   By: Almira Bar M.D.   On: 01/21/2023 20:09   DG Femur Min 2 Views Right  Result Date: 01/21/2023 CLINICAL DATA:  ON CHEMOTHERAPY, WITH RIGHT THIGH PAIN AND SWELLING, RIGHT KNEE PAIN, AND CHEST PAIN AT PORT SITE. [id number]. EXAM: PORTABLE CHEST 1 VIEW CR- RIGHT FEMUR 2 VIEWS CR- RIGHT KNEE - COMPLETE 4+ VIEW COMPARISON:  PA Lat chest 11/25/2022. No prior right femur or right knee films. FINDINGS: Chest AP portable, 7:35 p.m.: The heart size and mediastinal contours are within normal limits. Both lungs are clear. The visualized skeletal structures are unremarkable. There is new demonstration of a left chest port, with subclavian approach catheter terminating just above the superior cavoatrial junction. Right femur, AP Lat exam, total of 4 films: There is no evidence of fracture, dislocation or other focal bone abnormality. The right pelvic ring is intact. Arthritic changes  are not seen. Soft tissues are unremarkable. Right knee, routine four views: There is no evidence of fracture, dislocation, or joint effusion. No evidence of arthropathy or other focal bone abnormality. Soft tissues are unremarkable. A bone island is incidentally seen in the proximal tibial metaphysis. IMPRESSION: 1. No evidence of acute chest disease. Left chest port. 2. No evidence of acute right femur or right knee fracture or other acute process. Electronically Signed   By: Almira Bar M.D.   On: 01/21/2023 20:09  VAS [place] LOWER EXTREMITY VENOUS (DVT)  Result Date: 01/21/2023  Lower Venous DVT Study Patient Name:  Laurie Bell  Date of Exam:   01/21/2023 Medical Rec #: [id number]          Accession #:    [id number] Date of Birth: 12-21-1980         Patient Gender: F Patient Age:   23 years Exam Location:  [facility] Procedure:      VAS [place] LOWER EXTREMITY VENOUS (DVT) Referring Phys: Nehemiah Settle  --------------------------------------------------------------------------------  Indications: Thigh pain.  Risk Factors: Cancer: Breast. Comparison Study: Prior negative left LEV done 01/10/23 Performing Technologist: Sherren Kerns RVS  Examination Guidelines: A complete evaluation includes B-mode imaging, spectral Doppler, color Doppler, and power Doppler as needed of all accessible portions of each vessel. Bilateral testing is considered an integral part of a complete examination. Limited examinations for reoccurring indications may be performed as noted. The reflux portion of the exam is performed with the patient in reverse Trendelenburg.  +---------+---------------+---------+-----------+----------+--------------+ RIGHT    CompressibilityPhasicitySpontaneityPropertiesThrombus Aging +---------+---------------+---------+-----------+----------+--------------+ CFV      Full           Yes      Yes                                  +---------+---------------+---------+-----------+----------+--------------+ SFJ      Full                                                        +---------+---------------+---------+-----------+----------+--------------+ FV Prox  Full                                                        +---------+---------------+---------+-----------+----------+--------------+ FV Mid   Full                                                        +---------+---------------+---------+-----------+----------+--------------+ FV DistalFull                                                        +---------+---------------+---------+-----------+----------+--------------+ PFV      Full                                                        +---------+---------------+---------+-----------+----------+--------------+ POP      Full           Yes      Yes                                 +---------+---------------+---------+-----------+----------+--------------+  PTV      Full                                                        +---------+---------------+---------+-----------+----------+--------------+ PERO     Full                                                        +---------+---------------+---------+-----------+----------+--------------+   +----+---------------+---------+-----------+----------+--------------+ LEFTCompressibilityPhasicitySpontaneityPropertiesThrombus Aging +----+---------------+---------+-----------+----------+--------------+ CFV Full           Yes      Yes                                 +----+---------------+---------+-----------+----------+--------------+     Summary: RIGHT: - There is no evidence of deep vein thrombosis in the lower extremity.  - No cystic structure found in the popliteal fossa.  LEFT: - No evidence of common femoral vein obstruction.  *See table(s) above for measurements and observations.    Preliminary     Procedures Procedures     Medications Ordered in ED Medications  lactated ringers infusion ( Intravenous New Bag/Given 01/21/23 2215)  metroNIDAZOLE (FLAGYL) IVPB 500 mg (500 mg Intravenous New Bag/Given 01/21/23 2215)  vancomycin (VANCOREADY) IVPB 2000 mg/400 mL (has no administration in time range)  pantoprazole (PROTONIX) injection 40 mg (40 mg Intravenous Given 01/21/23 2039)  sodium chloride 0.9 % bolus 1,000 mL (0 mLs Intravenous Stopped 01/21/23 2200)  iohexol (OMNIPAQUE) 350 MG/ML injection 75 mL (75 mLs Intravenous Contrast Given 01/21/23 2111)  ceFEPIme (MAXIPIME) 2 g in sodium chloride 0.9 % 100 mL IVPB (0 g Intravenous Stopped 01/21/23 2215)    ED Course/ Medical Decision Making/ A&P                                 Medical Decision Making Patient is a 42 year old female, here for chest pain, shortness of breath, that is intermittently been going on for the last 5 days.  Additionally had some swelling right lower extremity, with some knee pain.  We will obtain x-rays, ultrasound for further evaluation as well as a CTA to rule out a PE.  Given that she is on chemo, is tachycardic, and complaining of chest pain and shortness of breath.  She is overall well-appearing.  Amount and/or Complexity of Data Reviewed Labs: ordered.    Details: Leukocytosis of 29 point today, lactic acid of 2.1K Radiology: ordered.    Details: Negative CTA, abdomen pelvis no acute findings of fracture, or effusions on x-rays of knees, or chest Discussion of management or test interpretation with external provider(s): Discussed with patient, and Dr. Silverio Lay, white count of 29,000k, she was having intermittent abdominal pain earlier this week so an abdomen pelvis was added, lactic acid of 2.1, once lactic acid resulted, Dr. Silverio Lay and I agreed that she should started on a antibiotic regimen for unknown source.  Antibiotics started, overall well-appearing.  Admitted to Dr. Cyndia Bent, for SIRS Hu of unknown etiology.  She is overall well-appearing no  evidence of any kind of rash, wound.  Scans are negative.    Risk Prescription drug management. Decision regarding hospitalization.   CRITICAL CARE Performed by: Pete Pelt   Total critical care time: 35 minutes  Critical care time was exclusive of separately billable procedures and treating other patients.  Critical care was necessary to treat or prevent imminent or life-threatening deterioration.  Critical care was time spent personally by me on the following activities: development of treatment plan with patient and/or surrogate as well as nursing, discussions with consultants, evaluation of patient's response to treatment, examination of patient, obtaining history from patient or surrogate, ordering and performing treatments and interventions, ordering and review of laboratory studies, ordering and review of radiographic studies, pulse oximetry and re-evaluation of patient's condition.  F [facility] Clinical Impression(s) / ED Diagnoses Final diagnoses:  SIRS (systemic inflammatory response syndrome) (HCC)  Chest pain, unspecified type  Right leg pain    Rx / DC Orders ED Discharge Orders     None         , Harley Alto, PA 01/21/23 2317    Charlynne Pander, MD 01/21/23 2320

## 2023-01-21 NOTE — ED Triage Notes (Signed)
Pt arrived via POV. C/o chest pain that started during chemo tx 5x days ago, pain at port site, and pain and swelling R thigh that began today.  AOx4

## 2023-01-21 NOTE — Progress Notes (Signed)
Pt being followed by [facility] for Sepsis protocol. 

## 2023-01-21 NOTE — Assessment & Plan Note (Signed)
Chest pain -presented with tachycardia,leukocytosis and elevated lactate but no clear source of infection -negative CT Chest/abd/pelvis -pt reports chest pain starting at infusion port shortly after chemotherapy this week. Port is also tender on exam with slight surround erythema. Suspect port could be source of infection. Blood cultures are pending.  -also reports increase urinary frequency- UA is pending -continue broad spectrum antibiotics with IV vancomycin and cefepime with blood cultures pending -will also obtain echocardiogram

## 2023-01-21 NOTE — Progress Notes (Signed)
PHARMACY -  BRIEF ANTIBIOTIC NOTE   Pharmacy has received consult(s) for vancomycin and cefepime from an ED provider.  The patient's profile has been reviewed for ht/wt/allergies/indication/available labs.    One time order(s) placed for vancomycin 2 g + cefepime 2 g  Further antibiotics/pharmacy consults should be ordered by admitting physician if indicated.                       Thank you,  Pricilla Riffle, PharmD, BCPS Clinical Pharmacist 01/21/2023 9:38 PM

## 2023-01-21 NOTE — Assessment & Plan Note (Signed)
-  Started Adriamycin and Cytoxan started January 01, 2023 and has been on 2 cycles -follows oncologist Dr. Pamelia Hoit

## 2023-01-21 NOTE — Progress Notes (Signed)
VASCULAR LAB    Right lower extremity venous duplex has been performed.  See CV proc for preliminary results.  Gave verbal report to [employer], PA-C  , , RVT 01/21/2023, 7:42 PM

## 2023-01-21 NOTE — H&P (Signed)
History and Physical    PatientMarland Kitchen AMIA Bell ZOX:[id number] DOB: 1980-07-05 DOA: 01/21/2023 DOS: the patient was seen and examined on 01/22/2023 PCP: Laurie Register, MD  Patient coming from: Home  Chief Complaint:  Chief Complaint  Patient presents with   Chest Pain   Leg Swelling   port pain   HPI: Laurie Bell is a 42 y.o. female with medical history significant of right breast cancer s/p right lumpectomy on chemotherapy, anxiety and insomnia who presents with chest pain and right LE edema.   Pt had chemotherapy on 8/8 and immediate with infusion of her chemotherapy she felt burning sensation to her chest port. Port placed on 12/01/2022. Thought it was perhaps reflux since she has hx of that. The following day she had mid-sternal burning chest pain with pressure and felt palpations. Pain has been intermittent since. She takes PPI and also GI cocktail at home with minimal relief. Has nausea but felt related to chemotherapy. She had  lower abdominal cramping one day this week. She took a laxative and had relief of her pain after a bowel movement. No fever or chills. No sick contact.   She also notes right thigh and knee swelling with pain. Denies any trauma. No rash or redness to the region.   In the ED, she was afebrile, normotensive, mildly tachycardic with HR of 111 on room air.  Leukocytosis of 29.8, lactate of 2.1.   Na of 135, mild hypokalemia of 3.4, CBG of 111, creatinine of 0.79.   Troponin of 6.   CTA chest negative for pulmonary embolism, no inflammatory changes noted around left chest wall.  CT A/P negative.   She was started on broad spectrum IV antibiotics and hospitalist consulted for further management.  Review of Systems: As mentioned in the history of present illness. All other systems reviewed and are negative. Past Medical History:  Diagnosis Date   Anxiety    Arthritis    Breast cancer (HCC)    Chlamydia contact, treated    GERD  (gastroesophageal reflux disease)    Hyperthyroidism    Past Surgical History:  Procedure Laterality Date   AXILLARY SENTINEL NODE BIOPSY Right 12/01/2022   Procedure: RIGHT AXILLARY SENTINEL NODE BIOPSY;  Surgeon: Abigail Miyamoto, MD;  Location: [facility] OR;  Service: General;  Laterality: Right;   BREAST BIOPSY Right 10/02/2022   MM RT BREAST BX W LOC DEV 1ST LESION IMAGE BX SPEC STEREO GUIDE 10/02/2022 [facility]   BREAST BIOPSY  11/10/2022   MM RT RADIOACTIVE SEED LOC MAMMO GUIDE 11/10/2022 [facility]   BREAST LUMPECTOMY WITH RADIOACTIVE SEED LOCALIZATION Right 11/13/2022   Procedure: RIGHT BREAST LUMPECTOMY WITH RADIOACTIVE SEED LOCALIZATION;  Surgeon: Abigail Miyamoto, MD;  Location: [facility];  Service: General;  Laterality: Right;   ENDOMETRIAL ABLATION N/A 04/25/2017   Procedure: ENDOMETRIAL ABLATION With NOVASURE;  Surgeon: Adam Phenix, MD;  Location: [facility];  Service: Gynecology;  Laterality: N/A;   ESOPHAGEAL MANOMETRY N/A 07/27/2021   Procedure: ESOPHAGEAL MANOMETRY (EM);  Surgeon: Laurie Burn, MD;  Location: [facility];  Service: Gastroenterology;  Laterality: N/A;   PORTACATH PLACEMENT N/A 12/01/2022   Procedure: INSERTION PORT-A-CATH WITH ULTRASOUND GUIDANCE;  Surgeon: Abigail Miyamoto, MD;  Location: [facility] OR;  Service: General;  Laterality: N/A;   RE-EXCISION OF BREAST CANCER,SUPERIOR MARGINS Right 12/01/2022   Procedure: RE-EXCISION OF RIGHT BREAST CANCER;  Surgeon: Abigail Miyamoto, MD;  Location: [facility] OR;  Service: General;  Laterality: Right;   TUBAL  LIGATION  2003   Social History:  reports that she has never smoked. She has never used smokeless tobacco. She reports that she does not currently use drugs after having used the following drugs: Marijuana. She reports that she does not drink alcohol.  Allergies  Allergen Reactions   Latex Other (See Comments)    Bell in vaginal area with latex condoms    Family History   Problem Relation Age of Onset   Hypertension Mother    Hypertension Maternal Aunt    Breast cancer Maternal Grandmother        dx > 50   Stomach cancer Maternal Grandfather        dx > 50   Breast cancer Paternal Grandmother        dx > 50   Esophageal cancer Neg Hx    Rectal cancer Neg Hx    Colon cancer Neg Hx     Prior to Admission medications   Medication Sig Start Date End Date Taking? Authorizing Provider  acetaminophen (TYLENOL) 500 MG tablet Take 1,000 mg by mouth every 6 (six) hours as needed for moderate pain.   Yes [provider]  albuterol (VENTOLIN HFA) 108 (90 Base) MCG/ACT inhaler TAKE 2 PUFFS BY MOUTH EVERY 6 HOURS AS NEEDED FOR WHEEZE OR SHORTNESS OF BREATH 12/22/22  Yes Newlin, Enobong, MD  cyclobenzaprine (FLEXERIL) 10 MG tablet TAKE 1 TABLET BY MOUTH AT BEDTIME AS NEEDED FOR MUSCLE SPASMS 10/31/22  Yes Kirsteins, Victorino Sparrow, MD  dexamethasone (DECADRON) 4 MG tablet Take 1 tablet day after chemo and 1 tablet 2 days after chemo with food 12/12/22  Yes Laurie Croissant, MD  DULoxetine (CYMBALTA) 60 MG capsule Take 1 capsule (60 mg total) by mouth daily. 11/29/22  Yes Laurie Register, MD  esomeprazole (NEXIUM) 40 MG capsule TAKE 1 CAPSULE BY MOUTH EVERY MORNING AND AT BEDTIME 12/19/22  Yes Laurie Burn, MD  fluticasone [facility]) 50 MCG/ACT nasal spray Place 2 sprays into both nostrils daily. 12/30/21  Yes Laurie Idol, Laurie Bell  gabapentin (NEURONTIN) 400 MG capsule Take 400 mg by mouth 3 (three) times daily as needed (pain).   Yes [provider]  hydrOXYzine (ATARAX) 25 MG tablet TAKE 1 TABLET(25 MG) BY MOUTH THREE TIMES DAILY 11/29/22  Yes Newlin, Enobong, MD  lidocaine (XYLOCAINE) 2 % solution Use as directed 15 mLs in the mouth or throat every 4 (four) hours as needed for mouth pain. 01/10/23  Yes [provider]  linaclotide Karlene Einstein) 145 MCG CAPS capsule Take 1 capsule (145 mcg total) by mouth daily before breakfast. 05/19/22  Yes Laurie Burn, MD   methimazole (TAPAZOLE) 5 MG tablet Take 1 tablet (5 mg total) by mouth as directed. 1 tablet Monday through Friday, skip Saturdays and  Sundays 09/27/22  Yes Shamleffer, Konrad Dolores, MD  metoprolol succinate (TOPROL-XL) 25 MG 24 hr tablet Take 0.5 tablets (12.5 mg total) by mouth daily. For increased heart rate 11/29/22  Yes Newlin, Enobong, MD  naproxen (NAPROSYN) 500 MG tablet TAKE 1 TABLET BY MOUTH TWICE A DAY AS NEEDED FOR PAIN 01/19/23  Yes Newlin, Enobong, MD  ondansetron (ZOFRAN) 8 MG tablet Take 1 tab (8 mg) by mouth every 8 hrs as needed for nausea/vomiting. Start third day after doxorubicin/cyclophosphamide chemotherapy. 12/12/22  Yes Laurie Croissant, MD  potassium chloride (KLOR-CON) 10 MEQ tablet Take 10 mEq by mouth daily. 08/08/22  Yes [provider]  prochlorperazine (COMPAZINE) 10 MG tablet Take 1 tablet (10 mg total) by mouth  every 6 (six) hours as needed for nausea or vomiting. 12/12/22  Yes Laurie Croissant, MD  zolpidem (AMBIEN) 5 MG tablet Take 1 tablet (5 mg total) by mouth at bedtime as needed for sleep. 01/16/23  Yes Laurie Croissant, MD  ipratropium (ATROVENT) 0.03 % nasal spray Place 2 sprays into both nostrils every 12 (twelve) hours. Patient not taking: Reported on 01/21/2023 12/20/22   Roxy Horseman, PA-C  medroxyPROGESTERone (PROVERA) 10 MG tablet Take 2 tablets by mouth daily Patient not taking: Reported on 06/05/2019 02/06/18 02/02/20  Conan Bowens, MD    Physical Exam: Vitals:   01/21/23 1834 01/21/23 2030 01/21/23 2256 01/21/23 2330  BP:  118/83  129/80  Pulse:  100  (!) 112  Resp:  19  18  Temp:   98 F (36.7 C)   TempSrc:   Oral   SpO2:  100%  100%  Weight: 91.6 kg     Height: 5\' 6"  (1.676 m)      Constitutional: NAD, calm, comfortable, Well appearing middle age female laying upright in bed Eyes: lids and conjunctivae normal ENMT: Mucous membranes are moist.  Neck: normal, supple Respiratory: clear to auscultation bilaterally, no wheezing, no crackles.  Normal respiratory effort. No accessory muscle use.  Cardiovascular: Regular rate and rhythm, no murmurs / rubs / gallops. No extremity edema. Mid sternal chest wall pain on palpation.  Abdomen: no tenderness, soft, non-distended Bowel sounds positive.  Musculoskeletal: no clubbing / cyanosis. No joint deformity upper and lower extremities. No obvious deformities or erythema to right LE. Right anterior knee is slight more boggy/edematous compared to left.  Right knee pain with flexion. No increase warmth to touch of the right LE.  Skin: Pain with palpation of left chest wall port with mild surrounding erythema. Neurologic: CN 2-12 grossly intact.  Psychiatric: Normal judgment and insight. Alert and oriented x 3. Normal mood.   Data Reviewed:  See HPI  Assessment and Plan: * SIRS (systemic inflammatory response syndrome) (HCC) Chest pain -presented with tachycardia,leukocytosis and elevated lactate but no clear source of infection -negative CT Chest/abd/pelvis -pt reports chest pain starting at infusion port shortly after chemotherapy this week. Port is also tender on exam with slight surround erythema. Suspect port could be source of infection. Blood cultures are pending.  -continue broad spectrum antibiotics with IV vancomycin and cefepime with blood cultures pending -will also obtain echocardiogram   Lower extremity pain, anterior, right -DVT ruled out with venous doppler -unclear etiology but will test ESR, CRP, uric acid for questionable gout -also obtain CT imaging to rule out any potential metastasis  Malignant neoplasm of upper-outer quadrant of right breast in female, estrogen receptor negative (HCC)  -Started Adriamycin and Cytoxan started January 01, 2023 and has been on 2 cycles -follows oncologist Dr. Pamelia Hoit  Hyperthyroidism -continue methimazole -recent TSH a few weeks ago was normal  Hypokalemia -replete with oral potassium      Advance Care Planning:   Code Status:  Full Code   Consults: none  Family Communication: aunt and cousin at bedside  Severity of Illness: The appropriate patient status for this patient is INPATIENT. Inpatient status is judged to be reasonable and necessary in order to provide the required intensity of service to ensure the patient's safety. The patient's presenting symptoms, physical exam findings, and initial radiographic and laboratory data in the context of their chronic comorbidities is felt to place them at high risk for further clinical deterioration. Furthermore, it is not anticipated that the patient  will be medically stable for discharge from the hospital within 2 midnights of admission.   * I certify that at the point of admission it is my clinical judgment that the patient will require inpatient hospital care spanning beyond 2 midnights from the point of admission due to high intensity of service, high risk for further deterioration and high frequency of surveillance required.*  Author: Anselm Jungling, DO 01/22/2023 1:02 AM  For on call review [email or web address].

## 2023-01-22 ENCOUNTER — Encounter (HOSPITAL_COMMUNITY): Payer: Self-pay | Admitting: Family Medicine

## 2023-01-22 ENCOUNTER — Inpatient Hospital Stay (HOSPITAL_COMMUNITY): Payer: 59

## 2023-01-22 DIAGNOSIS — R6510 Systemic inflammatory response syndrome (SIRS) of non-infectious origin without acute organ dysfunction: Secondary | ICD-10-CM | POA: Diagnosis not present

## 2023-01-22 DIAGNOSIS — D72829 Elevated white blood cell count, unspecified: Secondary | ICD-10-CM | POA: Diagnosis not present

## 2023-01-22 DIAGNOSIS — B370 Candidal stomatitis: Secondary | ICD-10-CM | POA: Diagnosis not present

## 2023-01-22 DIAGNOSIS — M79604 Pain in right leg: Secondary | ICD-10-CM

## 2023-01-22 DIAGNOSIS — F419 Anxiety disorder, unspecified: Secondary | ICD-10-CM | POA: Diagnosis not present

## 2023-01-22 DIAGNOSIS — R079 Chest pain, unspecified: Secondary | ICD-10-CM | POA: Diagnosis not present

## 2023-01-22 DIAGNOSIS — K219 Gastro-esophageal reflux disease without esophagitis: Secondary | ICD-10-CM | POA: Diagnosis not present

## 2023-01-22 DIAGNOSIS — E876 Hypokalemia: Secondary | ICD-10-CM | POA: Diagnosis not present

## 2023-01-22 DIAGNOSIS — E872 Acidosis, unspecified: Secondary | ICD-10-CM | POA: Diagnosis not present

## 2023-01-22 DIAGNOSIS — R208 Other disturbances of skin sensation: Secondary | ICD-10-CM | POA: Diagnosis not present

## 2023-01-22 DIAGNOSIS — M1990 Unspecified osteoarthritis, unspecified site: Secondary | ICD-10-CM | POA: Diagnosis not present

## 2023-01-22 DIAGNOSIS — E669 Obesity, unspecified: Secondary | ICD-10-CM | POA: Diagnosis not present

## 2023-01-22 DIAGNOSIS — K1231 Oral mucositis (ulcerative) due to antineoplastic therapy: Principal | ICD-10-CM

## 2023-01-22 DIAGNOSIS — Z95828 Presence of other vascular implants and grafts: Secondary | ICD-10-CM | POA: Diagnosis not present

## 2023-01-22 DIAGNOSIS — T458X5A Adverse effect of other primarily systemic and hematological agents, initial encounter: Secondary | ICD-10-CM | POA: Diagnosis not present

## 2023-01-22 DIAGNOSIS — Z8249 Family history of ischemic heart disease and other diseases of the circulatory system: Secondary | ICD-10-CM | POA: Diagnosis not present

## 2023-01-22 DIAGNOSIS — M7989 Other specified soft tissue disorders: Secondary | ICD-10-CM | POA: Diagnosis not present

## 2023-01-22 DIAGNOSIS — M25561 Pain in right knee: Secondary | ICD-10-CM | POA: Diagnosis not present

## 2023-01-22 DIAGNOSIS — T451X5A Adverse effect of antineoplastic and immunosuppressive drugs, initial encounter: Secondary | ICD-10-CM | POA: Diagnosis not present

## 2023-01-22 DIAGNOSIS — M25461 Effusion, right knee: Secondary | ICD-10-CM | POA: Diagnosis not present

## 2023-01-22 DIAGNOSIS — E0590 Thyrotoxicosis, unspecified without thyrotoxic crisis or storm: Secondary | ICD-10-CM | POA: Diagnosis not present

## 2023-01-22 DIAGNOSIS — G4700 Insomnia, unspecified: Secondary | ICD-10-CM | POA: Diagnosis not present

## 2023-01-22 DIAGNOSIS — Z6834 Body mass index (BMI) 34.0-34.9, adult: Secondary | ICD-10-CM | POA: Diagnosis not present

## 2023-01-22 DIAGNOSIS — C50411 Malignant neoplasm of upper-outer quadrant of right female breast: Secondary | ICD-10-CM | POA: Diagnosis not present

## 2023-01-22 DIAGNOSIS — R000 Tachycardia, unspecified: Secondary | ICD-10-CM | POA: Diagnosis not present

## 2023-01-22 LAB — BASIC METABOLIC PANEL
Anion gap: 9 (ref 5–15)
BUN: 9 mg/dL (ref 6–20)
CO2: 24 mmol/L (ref 22–32)
Calcium: 8.5 mg/dL — ABNORMAL LOW (ref 8.9–10.3)
Chloride: 103 mmol/L (ref 98–111)
Creatinine, Ser: 0.72 mg/dL (ref 0.44–1.00)
GFR, Estimated: >60 mL/min (ref >60)
Glucose, Bld: 130 mg/dL — ABNORMAL HIGH (ref 70–99)
Potassium: 3.8 mmol/L (ref 3.5–5.1)
Sodium: 136 mmol/L (ref 135–145)

## 2023-01-22 LAB — CBC
HCT: 34 % — ABNORMAL LOW (ref 36.0–46.0)
Hemoglobin: 11.3 g/dL — ABNORMAL LOW (ref 12.0–15.0)
MCH: 28.8 pg (ref 26.0–34.0)
MCHC: 33.2 g/dL (ref 30.0–36.0)
MCV: 86.7 fL (ref 80.0–100.0)
Platelets: 272 10*3/uL (ref 150–400)
RBC: 3.92 MIL/uL (ref 3.87–5.11)
RDW: 14.5 % (ref 11.5–15.5)
WBC: 20.8 10*3/uL — ABNORMAL HIGH (ref 4.0–10.5)
nRBC: 0 % (ref 0.0–0.2)

## 2023-01-22 LAB — ECHOCARDIOGRAM COMPLETE
Area-P 1/2: 4.57 cm2
Height: 66 in
S' Lateral: 2.5 cm
Weight: 3387.2 oz

## 2023-01-22 LAB — C-REACTIVE PROTEIN: CRP: 3.6 mg/dL — ABNORMAL HIGH (ref <1.0)

## 2023-01-22 LAB — URIC ACID: Uric Acid, Serum: 4.9 mg/dL (ref 2.5–7.1)

## 2023-01-22 LAB — HIV ANTIBODY (ROUTINE TESTING W REFLEX): HIV Screen 4th Generation wRfx: NONREACTIVE

## 2023-01-22 LAB — SEDIMENTATION RATE: Sed Rate: 16 mm/hr (ref 0–22)

## 2023-01-22 MED ORDER — DULOXETINE HCL 60 MG PO CPEP
60.0000 mg | ORAL_CAPSULE | Freq: Every day | ORAL | Status: DC
  Administered 2023-01-22: 60 mg via ORAL
  Filled 2023-01-22 (×2): qty 1

## 2023-01-22 MED ORDER — HYDROXYZINE HCL 25 MG PO TABS
25.0000 mg | ORAL_TABLET | Freq: Three times a day (TID) | ORAL | Status: DC
  Administered 2023-01-22 – 2023-01-23 (×4): 25 mg via ORAL
  Filled 2023-01-22 (×5): qty 1

## 2023-01-22 MED ORDER — ENOXAPARIN SODIUM 40 MG/0.4ML IJ SOSY
40.0000 mg | PREFILLED_SYRINGE | INTRAMUSCULAR | Status: DC
  Administered 2023-01-22 – 2023-01-23 (×2): 40 mg via SUBCUTANEOUS
  Filled 2023-01-22 (×2): qty 0.4

## 2023-01-22 MED ORDER — POTASSIUM CHLORIDE ER 10 MEQ PO TBCR
10.0000 meq | EXTENDED_RELEASE_TABLET | Freq: Every day | ORAL | Status: DC
  Administered 2023-01-22 – 2023-01-23 (×2): 10 meq via ORAL
  Filled 2023-01-22 (×4): qty 1

## 2023-01-22 MED ORDER — METHIMAZOLE 5 MG PO TABS
5.0000 mg | ORAL_TABLET | ORAL | Status: DC
  Administered 2023-01-22 – 2023-01-23 (×2): 5 mg via ORAL
  Filled 2023-01-22 (×2): qty 1

## 2023-01-22 MED ORDER — LIDOCAINE VISCOUS HCL 2 % MT SOLN
15.0000 mL | OROMUCOSAL | Status: DC | PRN
  Administered 2023-01-22: 15 mL via OROMUCOSAL
  Filled 2023-01-22 (×2): qty 15

## 2023-01-22 MED ORDER — SODIUM CHLORIDE 0.9 % IV SOLN
2.0000 g | Freq: Three times a day (TID) | INTRAVENOUS | Status: DC
  Administered 2023-01-22 (×2): 2 g via INTRAVENOUS
  Filled 2023-01-22 (×2): qty 12.5

## 2023-01-22 MED ORDER — SUCRALFATE 1 GM/10ML PO SUSP
1.0000 g | Freq: Three times a day (TID) | ORAL | Status: DC
  Administered 2023-01-22 – 2023-01-23 (×5): 1 g via ORAL
  Filled 2023-01-22 (×5): qty 10

## 2023-01-22 MED ORDER — PANTOPRAZOLE SODIUM 40 MG PO TBEC
80.0000 mg | DELAYED_RELEASE_TABLET | Freq: Every day | ORAL | Status: DC
  Administered 2023-01-22: 80 mg via ORAL
  Filled 2023-01-22: qty 2

## 2023-01-22 MED ORDER — VANCOMYCIN HCL 750 MG/150ML IV SOLN
750.0000 mg | Freq: Two times a day (BID) | INTRAVENOUS | Status: DC
  Administered 2023-01-22: 750 mg via INTRAVENOUS
  Filled 2023-01-22: qty 150

## 2023-01-22 MED ORDER — LINACLOTIDE 145 MCG PO CAPS
145.0000 ug | ORAL_CAPSULE | Freq: Every day | ORAL | Status: DC
  Filled 2023-01-22 (×2): qty 1

## 2023-01-22 MED ORDER — TRAMADOL HCL 50 MG PO TABS
50.0000 mg | ORAL_TABLET | Freq: Once | ORAL | Status: DC
  Filled 2023-01-22: qty 1

## 2023-01-22 MED ORDER — DICLOFENAC SODIUM 1 % EX GEL: 2.0000 g | Freq: Four times a day (QID) | CUTANEOUS | Status: DC | PRN

## 2023-01-22 MED ORDER — OXYCODONE HCL 5 MG PO TABS
5.0000 mg | ORAL_TABLET | Freq: Once | ORAL | Status: AC
  Administered 2023-01-22: 5 mg via ORAL
  Filled 2023-01-22: qty 1

## 2023-01-22 MED ORDER — ZOLPIDEM TARTRATE 5 MG PO TABS: 5.0000 mg | ORAL_TABLET | Freq: Every evening | ORAL | Status: DC | PRN

## 2023-01-22 MED ORDER — PANTOPRAZOLE SODIUM 40 MG PO TBEC
40.0000 mg | DELAYED_RELEASE_TABLET | Freq: Two times a day (BID) | ORAL | Status: DC
  Administered 2023-01-22 – 2023-01-23 (×3): 40 mg via ORAL
  Filled 2023-01-22 (×3): qty 1

## 2023-01-22 MED ORDER — POTASSIUM CHLORIDE CRYS ER 20 MEQ PO TBCR
40.0000 meq | EXTENDED_RELEASE_TABLET | Freq: Once | ORAL | Status: AC
  Administered 2023-01-22: 40 meq via ORAL
  Filled 2023-01-22: qty 2

## 2023-01-22 MED ORDER — ACETAMINOPHEN 500 MG PO TABS
1000.0000 mg | ORAL_TABLET | Freq: Four times a day (QID) | ORAL | Status: DC | PRN
  Administered 2023-01-22: 1000 mg via ORAL
  Filled 2023-01-22 (×2): qty 2

## 2023-01-22 MED ORDER — FLUCONAZOLE 100 MG PO TABS
200.0000 mg | ORAL_TABLET | Freq: Every day | ORAL | Status: DC
  Administered 2023-01-22 – 2023-01-23 (×2): 200 mg via ORAL
  Filled 2023-01-22 (×2): qty 2

## 2023-01-22 MED ORDER — FLUTICASONE PROPIONATE 50 MCG/ACT NA SUSP
2.0000 | Freq: Every day | NASAL | Status: DC
  Administered 2023-01-22: 2 via NASAL
  Filled 2023-01-22: qty 16

## 2023-01-22 NOTE — Assessment & Plan Note (Signed)
-  DVT ruled out with venous doppler -unclear etiology but will test ESR, CRP, uric acid for questionable gout -also obtain CT imaging to rule out any potential metastasis

## 2023-01-22 NOTE — Progress Notes (Signed)
Echocardiogram 2D Echocardiogram has been performed.  Leda Roys RDCS 01/22/2023, 1:59 PM

## 2023-01-22 NOTE — Progress Notes (Signed)
[facility] Progress Note Patient: Laurie Bell ION:[id number] DOB: August 06, 1980 DOA: 01/21/2023  DOS: the patient was seen and examined on 01/22/2023  Brief hospital course: PMH of anxiety, GERD, hypothyroidism, right breast cancer SP lumpectomy on chemotherapy with Port-A-Cath.  Present to the hospital with complaints of chest pain as well as right knee pain. Chest pain appears to be related to mucositis/GERD. Right knee pain appears to be related to Neulasta injections. Oncology consult appreciated.  Assessment and Plan: Chemotherapy associated mucositis. Oral candidiasis Chemotherapy session on 8/6. Appears to have developed worsening acid reflux/burning chest pain since then. Oral mucosa also appears to have some thrush. Will treat with Diflucan, twice daily PPI and Carafate and monitor response. Continue l lidocaine as needed.  Right knee pain. Presents with complaints of swelling of the right thigh as well as right knee pain. Prior to her this admission, patient was also seen in ER with left knee pain after her first session of chemotherapy. Suspect this is related to Neulasta injections. I do not appreciate any swelling or redness or warmth or erythema or rash or lumps or bumps at the time of my evaluation. No imaging necessary for now. X-ray knee already unremarkable. Topical treatment for pain.  Nasal burning. Likely associated with Cytoxan toxicity. Oncology aware.  Appreciate assistance.  SIRS. Sepsis ruled out. Patient was started on IV antibiotics with concerns for sepsis. Her leukocytosis is related to Neulasta. Currently no evidence of active infection based on a CT scan of the chest as well as abdomen. At present we will hold antibiotics and monitor. Viral workup is also negative.  Hypothyroidism. On methimazole. Which I will continue.  Anxiety. Continue home regimen.  Obesity Body mass index is 34.17 kg/m.  Placing the pt at higher risk of  poor outcomes.    Subjective: No nausea no vomiting no fever no chills.  Continues to have some chest pain.  Unchanged since chemotherapy.  Physical Exam: General: in Mild distress, No Rash, oral mucosa has thrush Cardiovascular: S1 and S2 Present, No Murmur Respiratory: Good respiratory effort, Bilateral Air entry present. No Crackles, No wheezes Abdomen: Bowel Sound present, No tenderness Extremities: No edema Neuro: Alert and oriented x3, no new focal deficit  Data Reviewed: I have Reviewed nursing notes, Vitals, and Lab results. Since last encounter, pertinent lab results CBC and BMP   . I have ordered test including CBC and BMP  . I have discussed pt's care plan and test results with oncology  .   Disposition: Status is: Inpatient Remains inpatient appropriate because: Monitor for stability overnight.  enoxaparin (LOVENOX) injection 40 mg Start: 01/22/23 0800   Family Communication: No one at bedside Level of care: Telemetry   Vitals:   01/22/23 0131 01/22/23 0651 01/22/23 1022 01/22/23 1507  BP:  127/72 111/85 121/88  Pulse:  99 97 95  Resp:  19 20 20   Temp:  98.3 F (36.8 C) 97.9 F (36.6 C) 98.4 F (36.9 C)  TempSrc:  Oral Oral Oral  SpO2:  100% 99% 100%  Weight: 96 kg     Height: 5\' 6"  (1.676 m)        Author: Lynden Oxford, MD 01/22/2023 6:03 PM  Please look on [email or web address] to find out who is on call.

## 2023-01-22 NOTE — Assessment & Plan Note (Signed)
-  replete with oral potassium ?
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
## 2023-01-22 NOTE — Assessment & Plan Note (Signed)
-  continue methimazole -recent TSH a few weeks ago was normal

## 2023-01-22 NOTE — Plan of Care (Signed)
  Problem: Health Behavior/Discharge Planning: Goal: Ability to manage health-related needs will improve Outcome: Progressing   Problem: Clinical Measurements: Goal: Ability to maintain clinical measurements within normal limits will improve Outcome: Progressing   Problem: Nutrition: Goal: Adequate nutrition will be maintained Outcome: Progressing   
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
## 2023-01-22 NOTE — Hospital Course (Addendum)
Brief hospital course: PMH of anxiety, GERD, hypothyroidism, right breast cancer SP lumpectomy on chemotherapy with Port-A-Cath.  Present to the hospital with complaints of chest pain as well as right knee pain. Chest pain appears to be related to mucositis/GERD. Right knee pain appears to be related to Neulasta injections. Oncology consult appreciated.  Assessment and Plan: Chemotherapy associated mucositis. Oral candidiasis Chemotherapy session on 8/6. Appears to have developed worsening acid reflux/burning chest pain since then. Oral mucosa also appears to have some thrush. Will treat with Diflucan, twice daily PPI and Carafate and monitor response. Continue l lidocaine as needed.  Right knee pain. Presents with complaints of swelling of the right thigh as well as right knee pain. Prior to her this admission, patient was also seen in ER with left knee pain after her first session of chemotherapy. Suspect this is related to Neulasta injections. I do not appreciate any swelling or redness or warmth or erythema or rash or lumps or bumps at the time of my evaluation. No imaging necessary for now. X-ray knee already unremarkable. Topical treatment for pain.  Nasal burning. Likely associated with Cytoxan toxicity. Oncology aware.  Appreciate assistance.  SIRS. Sepsis ruled out. Patient was started on IV antibiotics with concerns for sepsis. Her leukocytosis is related to Neulasta. Currently no evidence of active infection based on a CT scan of the chest as well as abdomen. At present we will hold antibiotics and monitor. Viral workup is also negative.  Hypothyroidism. On methimazole. Which I will continue.  Anxiety. Continue home regimen.  Obesity Body mass index is 34.17 kg/m.  Placing the pt at higher risk of poor outcomes.

## 2023-01-22 NOTE — Plan of Care (Signed)
  Problem: Activity: Goal: Risk for activity intolerance will decrease Outcome: Progressing   Problem: Coping: Goal: Level of anxiety will decrease Outcome: Progressing   Problem: Safety: Goal: Ability to remain free from injury will improve Outcome: Progressing   

## 2023-01-22 NOTE — Consult Note (Signed)
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  She receives this treatment on day 1 of a 14-day cycle with Neulasta or Neulasta bio similar support given on day 3.  She is currently cycle 2-day 7 of therapy.  She was admitted with chest pain, leukocytosis and concern for sepsis.  Laurie Bell has undergone a CT angiogram of the chest that was negative for pulmonary embolism, CT abdomen pelvis was negative.  She was started on IV antibiotics.  Cultures were obtained and are still pending.  She underwent echocardiogram today that demonstrated a left ventricular ejection fraction of 60 to 65%.  Right leg Doppler was negative for DVT.  She has been afebrile since admission.  Elease Hashimoto tells me that she is doing moderately well.  She has still experienced some burning from her chest to her nose today.  She denies any other issues.  She reviewed the events that led up to her hospitalization with me, and noted she is fearful for future treatments.     Objective:  Vitals:   01/22/23 0651 01/22/23 1022  BP: 127/72 111/85  Pulse: 99 97  Resp: 19 20  Temp: 98.3 F (36.8 C) 97.9 F (36.6 C)  SpO2: 100% 99%    Body mass index is 34.17 kg/m.  Intake/Output Summary (Last 24 hours) at 01/22/2023 1505 Last data filed at 01/22/2023 1025 Gross per 24 hour  Intake 920 ml  Output --  Net 920 ml     Sclerae unicteric  Oropharynx shows no thrush or other lesions  No cervical or supraclavicular adenopathy  Lungs no rales or wheezes--auscultated anterolaterally  Heart regular rate and rhythm  Abdomen soft, +BS  Neuro nonfocal    Labs:  Lab Results  Component Value Date   WBC 20.8 (H) 01/22/2023   HGB 11.3 (L) 01/22/2023   HCT 34.0 (L) 01/22/2023   MCV 86.7 01/22/2023   PLT 272 01/22/2023   NEUTROABS 7.5 01/16/2023     Basic Metabolic Panel: Recent Labs  Lab 01/16/23 0955 01/21/23 1917 01/22/23 0432  NA 139 135 136  K 3.7 3.4* 3.8  CL 106 101 103  CO2 26 27 24   GLUCOSE 117* 111* 130*  BUN 8 12 9   CREATININE 0.74 0.79 0.72  CALCIUM 8.8* 9.0 8.5*   GFR Estimated Creatinine Clearance: 107 mL/min (by C-G formula based on SCr of 0.72 mg/dL). Liver Function Tests: Recent Labs  Lab 01/16/23 0955 01/21/23 1917  AST 16 14*  ALT 18 17  ALKPHOS 138* 179*  BILITOT 0.2* 0.5  PROT 7.1 7.3  ALBUMIN 4.0 3.8    CBC: Recent Labs  Lab 01/16/23 0955 01/21/23 1955 01/22/23 0432  WBC 12.8* 29.8* 20.8*  NEUTROABS 7.5  --   --   HGB 12.2 12.3 11.3*  HCT 35.9* 36.9 34.0*  MCV 83.5 86.4 86.7  PLT 176 304 272     Assessment/Plan: 42 y.o. woman with stage IIA triple negative breast cancer cycle 2 day 7 of treatment with adriamycin/cytoxan admitted with chest pain, leukocytosis, and concern for possible sepsis.  Nasal Burning: likely secondary to Cytoxan.  Will follow with Laurie Bell as an outpatient to discuss dose reduction and rate reduction with cycle 3 of therapy to avoid this type of response.   Chest discomfort and leukocytosis: cultures and cardiac work up are negative thus far. This is likely  secondary to her Neulasta.  Her WBC will improve with time.  So long as cultures are negative and she is clinically stable, she can be discharged.    Other medical problems per hospitalist team.  Thank you for your great care of our mutual patient.    We will follow up with Laurie Bell on Monday, 01/29/2023 for labs and an office visit.  She will discuss with Dr. Pamelia Hoit her treatment plan and next steps.     Lillard Anes, NP 01/22/23 4:45 PM Medical Oncology and Hematology [facility] [address] [place], [place] [address] Tel. [phone]    Fax. [phone]

## 2023-01-23 DIAGNOSIS — R6510 Systemic inflammatory response syndrome (SIRS) of non-infectious origin without acute organ dysfunction: Secondary | ICD-10-CM | POA: Diagnosis not present

## 2023-01-23 LAB — CBC WITH DIFFERENTIAL/PLATELET
Abs Immature Granulocytes: 0.34 10*3/uL — ABNORMAL HIGH (ref 0.00–0.07)
Basophils Absolute: 0.1 10*3/uL (ref 0.0–0.1)
Basophils Relative: 0 %
Eosinophils Absolute: 0 10*3/uL (ref 0.0–0.5)
Eosinophils Relative: 0 %
HCT: 36.6 % (ref 36.0–46.0)
Hemoglobin: 12.1 g/dL (ref 12.0–15.0)
Immature Granulocytes: 2 %
Lymphocytes Relative: 19 %
Lymphs Abs: 2.8 10*3/uL (ref 0.7–4.0)
MCH: 28.5 pg (ref 26.0–34.0)
MCHC: 33.1 g/dL (ref 30.0–36.0)
MCV: 86.1 fL (ref 80.0–100.0)
Monocytes Absolute: 1.3 10*3/uL — ABNORMAL HIGH (ref 0.1–1.0)
Monocytes Relative: 9 %
Neutro Abs: 10.3 10*3/uL — ABNORMAL HIGH (ref 1.7–7.7)
Neutrophils Relative %: 70 %
Platelets: 320 10*3/uL (ref 150–400)
RBC: 4.25 MIL/uL (ref 3.87–5.11)
RDW: 14.3 % (ref 11.5–15.5)
WBC: 14.8 10*3/uL — ABNORMAL HIGH (ref 4.0–10.5)
nRBC: 0 % (ref 0.0–0.2)

## 2023-01-23 LAB — BASIC METABOLIC PANEL
Anion gap: 9 (ref 5–15)
BUN: 9 mg/dL (ref 6–20)
CO2: 25 mmol/L (ref 22–32)
Calcium: 9 mg/dL (ref 8.9–10.3)
Chloride: 101 mmol/L (ref 98–111)
Creatinine, Ser: 0.75 mg/dL (ref 0.44–1.00)
GFR, Estimated: >60 mL/min (ref >60)
Glucose, Bld: 100 mg/dL — ABNORMAL HIGH (ref 70–99)
Potassium: 3.4 mmol/L — ABNORMAL LOW (ref 3.5–5.1)
Sodium: 135 mmol/L (ref 135–145)

## 2023-01-23 LAB — URINALYSIS, ROUTINE W REFLEX MICROSCOPIC
Bacteria, UA: NONE SEEN
Bilirubin Urine: NEGATIVE
Glucose, UA: NEGATIVE mg/dL
Ketones, ur: NEGATIVE mg/dL
Leukocytes,Ua: NEGATIVE
Nitrite: NEGATIVE
Protein, ur: NEGATIVE mg/dL
Specific Gravity, Urine: 1.01 (ref 1.005–1.030)
pH: 7 (ref 5.0–8.0)

## 2023-01-23 MED ORDER — PANTOPRAZOLE SODIUM 40 MG PO TBEC: 40.0000 mg | DELAYED_RELEASE_TABLET | Freq: Two times a day (BID) | ORAL | 0 refills | Status: DC

## 2023-01-23 MED ORDER — LINACLOTIDE 145 MCG PO CAPS
145.0000 ug | ORAL_CAPSULE | Freq: Every day | ORAL | Status: DC
  Administered 2023-01-23: 145 ug via ORAL
  Filled 2023-01-23 (×2): qty 1

## 2023-01-23 MED ORDER — FLUCONAZOLE 200 MG PO TABS: 200.0000 mg | ORAL_TABLET | Freq: Every day | ORAL | 0 refills | Status: AC

## 2023-01-23 MED ORDER — HYDROCORTISONE (PERIANAL) 2.5 % EX CREA: TOPICAL_CREAM | Freq: Four times a day (QID) | CUTANEOUS | 0 refills | Status: DC | PRN

## 2023-01-23 MED ORDER — HYDROCORTISONE (PERIANAL) 2.5 % EX CREA: TOPICAL_CREAM | Freq: Four times a day (QID) | CUTANEOUS | Status: DC | PRN

## 2023-01-23 MED ORDER — SUCRALFATE 1 G PO TABS: 1.0000 g | ORAL_TABLET | Freq: Four times a day (QID) | ORAL | 0 refills | Status: DC

## 2023-01-23 NOTE — Plan of Care (Signed)

## 2023-01-23 NOTE — Progress Notes (Signed)
Transition of Care [facility]) - Inpatient Brief Assessment   Patient Details  Name: Laurie Bell MRN: [id number] Date of Birth: 03/24/1981  Transition [facility] CM/SW Contact:    Larrie Kass, LCSW Phone Number: 01/23/2023, 1:01 PM   Clinical Narrative:    Transition of Care Asessment: Insurance and Status: Insurance coverage has been reviewed Patient has primary care physician: Yes Home environment has been reviewed: yes Prior level of function:: independent Prior/Current Home Services: No current home services Social Determinants of Health Reivew: SDOH reviewed no interventions necessary Readmission risk has been reviewed: Yes Transition of care needs: no transition of care needs at this time

## 2023-01-23 NOTE — Plan of Care (Signed)
  Problem: Education: Goal: Knowledge of General Education information will improve Description: Including pain rating scale, medication(s)/side effects and non-pharmacologic comfort measures 01/23/2023 1906 by Clabe Seal, Reita Chard, RN Outcome: Completed/Met 01/23/2023 1409 by Janan Halter, RN Outcome: Progressing   Problem: Health Behavior/Discharge Planning: Goal: Ability to manage health-related needs will improve 01/23/2023 1906 by Janan Halter, RN Outcome: Completed/Met 01/23/2023 1409 by Janan Halter, RN Outcome: Progressing   Problem: Clinical Measurements: Goal: Ability to maintain clinical measurements within normal limits will improve 01/23/2023 1906 by Janan Halter, RN Outcome: Completed/Met 01/23/2023 1409 by Janan Halter, RN Outcome: Progressing Goal: Will remain free from infection 01/23/2023 1906 by Janan Halter, RN Outcome: Completed/Met 01/23/2023 1409 by Janan Halter, RN Outcome: Progressing Goal: Diagnostic test results will improve 01/23/2023 1906 by Janan Halter, RN Outcome: Completed/Met 01/23/2023 1409 by Janan Halter, RN Outcome: Progressing Goal: Respiratory complications will improve 01/23/2023 1906 by Janan Halter, RN Outcome: Completed/Met 01/23/2023 1409 by Janan Halter, RN Outcome: Progressing Goal: Cardiovascular complication will be avoided 01/23/2023 1906 by Janan Halter, RN Outcome: Completed/Met 01/23/2023 1409 by Janan Halter, RN Outcome: Progressing   Problem: Activity: Goal: Risk for activity intolerance will decrease 01/23/2023 1906 by Janan Halter, RN Outcome: Completed/Met 01/23/2023 1409 by Janan Halter, RN Outcome: Progressing   Problem: Nutrition: Goal: Adequate nutrition will be maintained 01/23/2023 1906 by Janan Halter, RN Outcome: Completed/Met 01/23/2023  1409 by Janan Halter, RN Outcome: Progressing   Problem: Coping: Goal: Level of anxiety will decrease 01/23/2023 1906 by Janan Halter, RN Outcome: Completed/Met 01/23/2023 1409 by Janan Halter, RN Outcome: Progressing   Problem: Elimination: Goal: Will not experience complications related to bowel motility 01/23/2023 1906 by Janan Halter, RN Outcome: Completed/Met 01/23/2023 1409 by Janan Halter, RN Outcome: Progressing Goal: Will not experience complications related to urinary retention 01/23/2023 1906 by Janan Halter, RN Outcome: Completed/Met 01/23/2023 1409 by Janan Halter, RN Outcome: Progressing   Problem: Pain Managment: Goal: General experience of comfort will improve 01/23/2023 1906 by Janan Halter, RN Outcome: Completed/Met 01/23/2023 1409 by Janan Halter, RN Outcome: Progressing   Problem: Safety: Goal: Ability to remain free from injury will improve 01/23/2023 1906 by Janan Halter, RN Outcome: Completed/Met 01/23/2023 1409 by Janan Halter, RN Outcome: Progressing   Problem: Skin Integrity: Goal: Risk for impaired skin integrity will decrease 01/23/2023 1906 by Janan Halter, RN Outcome: Completed/Met 01/23/2023 1409 by Janan Halter, RN Outcome: Progressing

## 2023-01-24 ENCOUNTER — Telehealth: Payer: Self-pay

## 2023-01-24 NOTE — Transitions of Care (Post Inpatient/ED Visit) (Signed)
01/25/2023  Name: Laurie Bell MRN: [id number] DOB: 1980/07/10  Today's TOC FU Call Status: Today's TOC FU Call Status:: Successful TOC FU Call Completed TOC FU Call Complete Date: 01/24/23  Transition Care Management Follow-up Telephone Call Date of Discharge: 01/23/23 Discharge Facility: [facility]) Type of Discharge: Inpatient Admission Primary Inpatient Discharge Diagnosis:: "SIRS" How have you been since you were released from the hospital?: Better ([name]-"slept better last night and pain a little better."Appetite fair-does report some metallic taste at times with certain foods. No N&V. No BM since coming home yet.) Any questions or concerns?: No  Items Reviewed: Did you receive and understand the discharge instructions provided?: Yes Medications obtained,verified, and reconciled?: Yes (Medications Reviewed) (pt has not picked up new meds but plans to do so today) Any new allergies since your discharge?: No Dietary orders reviewed?: Yes Type of Diet Ordered:: low salt/heart healthy Do you have support at home?: Yes People in Home: significant other  Medications Reviewed Today: Medications Reviewed Today     Reviewed by Charlyn Minerva, RN (Registered Nurse) on 01/24/23 at 1221  Med List Status: <None>   Medication Order Taking? Sig Documenting Provider Last Dose Status Informant  acetaminophen (TYLENOL) 500 MG tablet [id number] Yes Take 1,000 mg by mouth every 6 (six) hours as needed for moderate pain. [provider] Taking Active Self, Pharmacy Records  albuterol (VENTOLIN HFA) 108 (90 Base) MCG/ACT inhaler [id number] Yes TAKE 2 PUFFS BY MOUTH EVERY 6 HOURS AS NEEDED FOR WHEEZE OR SHORTNESS OF Clarisa Fling, MD Taking Active Self, Pharmacy Records  cyclobenzaprine (FLEXERIL) 10 MG tablet [id number] Yes TAKE 1 TABLET BY MOUTH AT BEDTIME AS NEEDED FOR MUSCLE SPASMS Kirsteins, Victorino Sparrow, MD Taking Active Self, Pharmacy  Records  dexamethasone (DECADRON) 4 MG tablet [id number] Yes Take 1 tablet day after chemo and 1 tablet 2 days after chemo with food Serena Croissant, MD Taking Active Self, Pharmacy Records  DULoxetine (CYMBALTA) 60 MG capsule [id number] Yes Take 1 capsule (60 mg total) by mouth daily. Hoy Register, MD Taking Active Self, Pharmacy Records  fluconazole (DIFLUCAN) 200 MG tablet [id number]  Take 1 tablet (200 mg total) by mouth daily for 5 days. Rolly Salter, MD  Active   fluticasone [facility]) 50 MCG/ACT nasal spray [id number] Yes Place 2 sprays into both nostrils daily. Lurline Idol, FNP Taking Active Self, Pharmacy Records  gabapentin (NEURONTIN) 400 MG capsule [id number] Yes Take 400 mg by mouth 3 (three) times daily as needed (pain). [provider] Taking Active Self, Pharmacy Records  hydrocortisone (ANUSOL-HC) 2.5 % rectal cream [id number] Yes Place rectally 4 (four) times daily as needed for hemorrhoids or anal itching. Rolly Salter, MD Taking Active   hydrOXYzine (ATARAX) 25 MG tablet [id number] Yes TAKE 1 TABLET(25 MG) BY MOUTH THREE TIMES DAILY Hoy Register, MD Taking Active Self, Pharmacy Records  ipratropium (ATROVENT) 0.03 % nasal spray [id number] No Place 2 sprays into both nostrils every 12 (twelve) hours.  Patient not taking: Reported on 01/21/2023   Roxy Horseman, PA-C Not Taking Active Self, Pharmacy Records  lidocaine (XYLOCAINE) 2 % solution [id number] Yes Use as directed 15 mLs in the mouth or throat every 4 (four) hours as needed for mouth pain. [provider] Taking Active Self, Pharmacy Records  linaclotide [facility]) 145 MCG CAPS capsule [id number] Yes Take 1 capsule (145 mcg total) by mouth daily before breakfast. Imogene Burn, MD Taking Active Self, Pharmacy Records   Patient not  taking:   Discontinued 02/02/20 1436 methimazole (TAPAZOLE) 5 MG tablet [id number] Yes Take 1 tablet (5 mg total) by mouth as directed. 1 tablet Monday through Friday, skip  Saturdays and  Sundays Shamleffer, Konrad Dolores, MD Taking Active Self, Pharmacy Records  metoprolol succinate (TOPROL-XL) 25 MG 24 hr tablet [id number] Yes Take 0.5 tablets (12.5 mg total) by mouth daily. For increased heart rate Hoy Register, MD Taking Active Self, Pharmacy Records  naproxen (NAPROSYN) 500 MG tablet [id number] Yes TAKE 1 TABLET BY MOUTH TWICE A DAY AS NEEDED FOR PAIN Hoy Register, MD Taking Active Self, Pharmacy Records  ondansetron (ZOFRAN) 8 MG tablet [id number] Yes Take 1 tab (8 mg) by mouth every 8 hrs as needed for nausea/vomiting. Start third day after doxorubicin/cyclophosphamide chemotherapy. Serena Croissant, MD Taking Active Self, Pharmacy Records  pantoprazole (PROTONIX) 40 MG tablet [id number]  Take 1 tablet (40 mg total) by mouth 2 (two) times daily before a meal for 13 days. Rolly Salter, MD  Active   potassium chloride (KLOR-CON) 10 MEQ tablet [id number] Yes Take 10 mEq by mouth daily. [provider] Taking Active Self, Pharmacy Records  prochlorperazine (COMPAZINE) 10 MG tablet [id number] Yes Take 1 tablet (10 mg total) by mouth every 6 (six) hours as needed for nausea or vomiting. Serena Croissant, MD Taking Active Self, Pharmacy Records  sucralfate (CARAFATE) 1 g tablet [id number]  Take 1 tablet (1 g total) by mouth 4 (four) times daily for 14 days. Rolly Salter, MD  Active   zolpidem [facility]) 5 MG tablet [id number] Yes Take 1 tablet (5 mg total) by mouth at bedtime as needed for sleep. Serena Croissant, MD Taking Active Self, Pharmacy Records            Home Care and Equipment/Supplies: Were Home Health Services Ordered?: NA Any new equipment or medical supplies ordered?: NA  Functional Questionnaire: Do you need assistance with bathing/showering or dressing?: No Do you need assistance with meal preparation?: No Do you need assistance with eating?: No Do you have difficulty maintaining continence: No Do you need assistance with getting out of  bed/getting out of a chair/moving?: No Do you have difficulty managing or taking your medications?: No  Follow up appointments reviewed: PCP Follow-up appointment confirmed?: No (Pt declined making PCP appt-states she is being followed closely by oncologist) MD Provider Line [phone] Given: No Specialist Hospital Follow-up appointment confirmed?: Yes Date of Specialist follow-up appointment?: 01/29/23 Follow-Up Specialty Provider:: D. Pamelia Hoit Do you need transportation to your follow-up appointment?: No Do you understand care options if your condition(s) worsen?: Yes-patient verbalized understanding  SDOH Interventions Today    Flowsheet Row Most Recent Value  SDOH Interventions   Food Insecurity Interventions Intervention Not Indicated  Transportation Interventions Intervention Not Indicated      TOC Interventions Today    Flowsheet Row Most Recent Value  TOC Interventions   TOC Interventions Discussed/Reviewed TOC Interventions Discussed      Interventions Today    Flowsheet Row Most Recent Value  Chronic Disease   Chronic disease during today's visit Other  [Breast CA]  General Interventions   General Interventions Discussed/Reviewed General Interventions Discussed, Doctor Visits  Doctor Visits Discussed/Reviewed PCP, Doctor Visits Discussed, Specialist  PCP/Specialist Visits Compliance with follow-up visit  Education Interventions   Education Provided Provided Education  Provided Verbal Education On Nutrition, When to see the doctor, Medication  Nutrition Interventions   Nutrition Discussed/Reviewed Nutrition Discussed, Adding fruits and vegetables, Increasing proteins  Pharmacy Interventions   Pharmacy Dicussed/Reviewed  Pharmacy Topics Discussed, Medications and their functions  Safety Interventions   Safety Discussed/Reviewed Safety Discussed       Alessandra Grout [facility] Care Management Care Management Community  Coordinator Direct Phone: [phone] Toll Free: [phone] Fax: [phone]

## 2023-01-25 ENCOUNTER — Inpatient Hospital Stay: Payer: 59

## 2023-01-25 NOTE — Progress Notes (Signed)
[facility] CSW Progress Note  Clinical Child psychotherapist contacted patient by phone to provide update from the [employer] in [place] regarding question about financial documents. Patient inquired about the [employer] [facility] documentation, stated that she received email stating documentation had not been received. Csw refaxed medical documentation.CSW got verbal permission to forward email from [place] in [place] foundation to her email on file. CSW attached fax confirmation that medical documentation was sent.   Marguerita Merles, LCSWA Clinical Social Worker [facility]

## 2023-01-29 ENCOUNTER — Other Ambulatory Visit: Payer: Self-pay

## 2023-01-29 ENCOUNTER — Inpatient Hospital Stay: Payer: 59

## 2023-01-29 ENCOUNTER — Ambulatory Visit: Payer: Self-pay | Admitting: Licensed Clinical Social Worker

## 2023-01-29 ENCOUNTER — Inpatient Hospital Stay (HOSPITAL_BASED_OUTPATIENT_CLINIC_OR_DEPARTMENT_OTHER): Payer: 59 | Admitting: Hematology and Oncology

## 2023-01-29 ENCOUNTER — Encounter: Payer: Self-pay | Admitting: Hematology and Oncology

## 2023-01-29 VITALS — BP 142/78 | HR 103 | Temp 97.8°F | Resp 18 | Ht 66.0 in | Wt 215.2 lb

## 2023-01-29 DIAGNOSIS — Z171 Estrogen receptor negative status [ER-]: Secondary | ICD-10-CM | POA: Diagnosis not present

## 2023-01-29 DIAGNOSIS — G4700 Insomnia, unspecified: Secondary | ICD-10-CM | POA: Diagnosis not present

## 2023-01-29 DIAGNOSIS — C50411 Malignant neoplasm of upper-outer quadrant of right female breast: Secondary | ICD-10-CM | POA: Diagnosis not present

## 2023-01-29 DIAGNOSIS — Z5111 Encounter for antineoplastic chemotherapy: Secondary | ICD-10-CM | POA: Diagnosis not present

## 2023-01-29 DIAGNOSIS — Z5189 Encounter for other specified aftercare: Secondary | ICD-10-CM | POA: Diagnosis not present

## 2023-01-29 DIAGNOSIS — Z79899 Other long term (current) drug therapy: Secondary | ICD-10-CM | POA: Diagnosis not present

## 2023-01-29 DIAGNOSIS — D0511 Intraductal carcinoma in situ of right breast: Secondary | ICD-10-CM | POA: Diagnosis not present

## 2023-01-29 DIAGNOSIS — F419 Anxiety disorder, unspecified: Secondary | ICD-10-CM | POA: Diagnosis not present

## 2023-01-29 DIAGNOSIS — R5383 Other fatigue: Secondary | ICD-10-CM | POA: Diagnosis not present

## 2023-01-29 LAB — CBC WITH DIFFERENTIAL (CANCER CENTER ONLY)
Abs Immature Granulocytes: 1.34 10*3/uL — ABNORMAL HIGH (ref 0.00–0.07)
Band Neutrophils: 6 %
Basophils Absolute: 0 10*3/uL (ref 0.0–0.1)
Basophils Relative: 0 %
Blasts: 0 %
Eosinophils Absolute: 0 10*3/uL (ref 0.0–0.5)
Eosinophils Relative: 0 %
HCT: 35.2 % — ABNORMAL LOW (ref 36.0–46.0)
Hemoglobin: 11.9 g/dL — ABNORMAL LOW (ref 12.0–15.0)
Lymphocytes Relative: 17 %
Lymphs Abs: 2.9 10*3/uL (ref 0.7–4.0)
MCH: 28.6 pg (ref 26.0–34.0)
MCHC: 33.8 g/dL (ref 30.0–36.0)
MCV: 84.6 fL (ref 80.0–100.0)
Metamyelocytes Relative: 7 %
Monocytes Absolute: 1.3 10*3/uL — ABNORMAL HIGH (ref 0.1–1.0)
Monocytes Relative: 8 %
Myelocytes: 1 %
Neutro Abs: 11.3 10*3/uL — ABNORMAL HIGH (ref 1.7–7.7)
Neutrophils Relative %: 61 %
Other: 0 %
Platelet Count: 181 10*3/uL (ref 150–400)
Promyelocytes Relative: 0 %
RBC: 4.16 MIL/uL (ref 3.87–5.11)
RDW: 14.8 % (ref 11.5–15.5)
Smear Review: NORMAL
WBC Count: 16.8 10*3/uL — ABNORMAL HIGH (ref 4.0–10.5)
nRBC: 0.7 % — ABNORMAL HIGH (ref 0.0–0.2)
nRBC: 3 /100 WBC — ABNORMAL HIGH

## 2023-01-29 LAB — CMP (CANCER CENTER ONLY)
ALT: 20 U/L (ref 0–44)
AST: 17 U/L (ref 15–41)
Albumin: 4 g/dL (ref 3.5–5.0)
Alkaline Phosphatase: 141 U/L — ABNORMAL HIGH (ref 38–126)
Anion gap: 6 (ref 5–15)
BUN: 9 mg/dL (ref 6–20)
CO2: 27 mmol/L (ref 22–32)
Calcium: 9.1 mg/dL (ref 8.9–10.3)
Chloride: 106 mmol/L (ref 98–111)
Creatinine: 0.72 mg/dL (ref 0.44–1.00)
GFR, Estimated: >60 mL/min (ref >60)
Glucose, Bld: 95 mg/dL (ref 70–99)
Potassium: 3.7 mmol/L (ref 3.5–5.1)
Sodium: 139 mmol/L (ref 135–145)
Total Bilirubin: 0.3 mg/dL (ref 0.3–1.2)
Total Protein: 7.1 g/dL (ref 6.5–8.1)

## 2023-01-29 LAB — PREGNANCY, URINE: Preg Test, Ur: NEGATIVE

## 2023-01-29 MED FILL — Fosaprepitant Dimeglumine For IV Infusion 150 MG (Base Eq): INTRAVENOUS | Qty: 5 | Status: AC

## 2023-01-29 MED FILL — Dexamethasone Sodium Phosphate Inj 100 MG/10ML: INTRAMUSCULAR | Qty: 1 | Status: AC

## 2023-01-29 NOTE — Progress Notes (Signed)
Patient Care Team: Hoy Register, MD as PCP - General (Family Medicine) Corky Crafts, MD as PCP - Cardiology (Cardiology) Glendale Chard, DO as Consulting Physician (Neurology) Abigail Miyamoto, MD as Consulting Physician (General Surgery) Serena Croissant, MD as Consulting Physician (Hematology and Oncology) Lonie Peak, MD as Attending Physician (Radiation Oncology) Pershing Proud, RN as Oncology Nurse Navigator Donnelly Angelica, RN as Oncology Nurse Navigator  DIAGNOSIS:  Encounter Diagnosis  Name Primary?   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, ER 30% weak, PR 2%   10/11/2022 Cancer Staging   Staging form: Breast, AJCC 8th Edition - Clinical: Stage 0 (cTis (DCIS), cN0, cM0, G3, ER+, PR+, HER2: Not Assessed) - Signed by Serena Croissant, MD on 10/11/2022 Stage prefix: Initial diagnosis Histologic grading system: 3 grade system   10/11/2022 Genetic Testing   Declined hereditary cancer genetic testing    11/13/2022 Surgery   Right lumpectomy: Grade 3 IDC, 2 cm in size inferior and posterior margins are positive, DCIS posterior margin positive, lymphovascular invasion not identified, ER 0%, PR 0%, Ki-67 85%, HER2 0 by [facility]   12/01/2022 Surgery   Margin excision: Posterior margin: Scattered foci of high-grade DCIS, 0/2 lymph nodes negative   01/01/2023 -  Chemotherapy   Patient is on Treatment Plan : BREAST ADJUVANT DOSE DENSE AC q14d / PACLitaxel q7d       CHIEF COMPLIANT: hospital f/u   INTERVAL HISTORY: Laurie Bell is a 42 y.o. female is here because of ER/PR  negative. She presents to the clinic for a follow-up. Patient reports that her chest was burning and leg was hurting after treatment. She did not tolerate last treatment. She did have problems with port access, but say it is better as of today.   ALLERGIES:  is allergic to latex and tramadol.  MEDICATIONS:  Current Outpatient Medications  Medication Sig Dispense Refill   acetaminophen (TYLENOL) 500 MG tablet Take 1,000 mg by mouth every 6 (six) hours as needed for moderate pain.     albuterol (VENTOLIN HFA) 108 (90 Base) MCG/ACT inhaler TAKE 2 PUFFS BY MOUTH EVERY 6 HOURS AS NEEDED FOR WHEEZE OR SHORTNESS OF BREATH 54 each 0   cyclobenzaprine (FLEXERIL) 10 MG tablet TAKE 1 TABLET BY MOUTH AT BEDTIME AS NEEDED FOR MUSCLE SPASMS 30 tablet 1   dexamethasone (DECADRON) 4 MG tablet Take 1 tablet day after chemo and 1 tablet 2 days after chemo with food 30 tablet 0   DULoxetine (CYMBALTA) 60 MG capsule Take 1 capsule (60 mg total) by mouth daily. 90 capsule 1   fluconazole (DIFLUCAN) 200 MG tablet Take 1 tablet (200 mg total) by mouth daily for 5 days. 5 tablet 0   fluticasone (FLONASE) 50 MCG/ACT nasal spray Place 2 sprays into both nostrils daily. 16 g 0   gabapentin (NEURONTIN) 400 MG capsule Take 400 mg by mouth 3 (three) times daily as needed (pain).     hydrocortisone (ANUSOL-HC) 2.5 % rectal cream Place rectally 4 (four) times daily as needed for hemorrhoids or anal itching. 30 g 0   hydrOXYzine (ATARAX) 25 MG tablet TAKE 1 TABLET(25 MG) BY MOUTH THREE TIMES  DAILY 270 tablet 1   ipratropium (ATROVENT) 0.03 % nasal spray Place 2 sprays into both nostrils every 12 (twelve) hours. (Patient not taking: Reported on 01/21/2023) 30 mL 0   lidocaine (XYLOCAINE) 2 % solution Use as directed 15 mLs in the mouth or throat every 4 (four) hours as needed for mouth pain.     linaclotide (LINZESS) 145 MCG CAPS capsule Take 1 capsule (145 mcg total) by mouth daily before breakfast. 30 capsule 5   methimazole  (TAPAZOLE) 5 MG tablet Take 1 tablet (5 mg total) by mouth as directed. 1 tablet Monday through Friday, skip Saturdays and  Sundays 60 tablet 3   metoprolol succinate (TOPROL-XL) 25 MG 24 hr tablet Take 0.5 tablets (12.5 mg total) by mouth daily. For increased heart rate 45 tablet 1   naproxen (NAPROSYN) 500 MG tablet TAKE 1 TABLET BY MOUTH TWICE A DAY AS NEEDED FOR PAIN 60 tablet 1   ondansetron (ZOFRAN) 8 MG tablet Take 1 tab (8 mg) by mouth every 8 hrs as needed for nausea/vomiting. Start third day after doxorubicin/cyclophosphamide chemotherapy. 30 tablet 1   pantoprazole (PROTONIX) 40 MG tablet Take 1 tablet (40 mg total) by mouth 2 (two) times daily before a meal for 13 days. 26 tablet 0   potassium chloride (KLOR-CON) 10 MEQ tablet Take 10 mEq by mouth daily.     prochlorperazine (COMPAZINE) 10 MG tablet Take 1 tablet (10 mg total) by mouth every 6 (six) hours as needed for nausea or vomiting. 30 tablet 1   sucralfate (CARAFATE) 1 g tablet Take 1 tablet (1 g total) by mouth 4 (four) times daily for 14 days. 56 tablet 0   zolpidem (AMBIEN) 5 MG tablet Take 1 tablet (5 mg total) by mouth at bedtime as needed for sleep. 30 tablet 0   No current facility-administered medications for this visit.    PHYSICAL EXAMINATION: ECOG PERFORMANCE STATUS: 1 - Symptomatic but completely ambulatory  Vitals:   01/29/23 1454  BP: (!) 142/78  Pulse: (!) 103  Resp: 18  Temp: 97.8 F (36.6 C)  SpO2: 99%   Filed Weights   01/29/23 1454  Weight: 215 lb 3.2 oz (97.6 kg)     LABORATORY DATA:  I have reviewed the data as listed    Latest Ref Rng & Units 01/23/2023    3:50 PM 01/22/2023    4:32 AM 01/21/2023    7:17 PM  CMP  Glucose 70 - 99 mg/dL 119  147  829   BUN 6 - 20 mg/dL 9  9  12    Creatinine 0.44 - 1.00 mg/dL 5.62  1.30  8.65   Sodium 135 - 145 mmol/L 135  136  135   Potassium 3.5 - 5.1 mmol/L 3.4  3.8  3.4   Chloride 98 - 111 mmol/L 101  103  101   CO2 22 - 32 mmol/L 25  24  27     Calcium 8.9 - 10.3 mg/dL 9.0  8.5  9.0   Total Protein 6.5 - 8.1 g/dL   7.3   Total Bilirubin 0.3 - 1.2 mg/dL   0.5   Alkaline Phos 38 - 126 U/L   179   AST 15 - 41 U/L   14   ALT 0 - 44 U/L   17     Lab Results  Component Value Date   WBC 16.8 (H) 01/29/2023   HGB 11.9 (L) 01/29/2023   HCT 35.2 (L) 01/29/2023   MCV 84.6 01/29/2023  PLT 181 01/29/2023   NEUTROABS PENDING 01/29/2023    ASSESSMENT & PLAN:  Malignant neoplasm of upper-outer quadrant of right breast in female, estrogen receptor negative (HCC) 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, DCIS posterior margin positive, lymphovascular invasion not identified, ER 0%, PR 0%, Ki-67 85%, HER2 0 by IHC Margin excision: Posterior margin: Scattered foci of high-grade DCIS, 0/2 lymph nodes negative, final margins negative  (10/02/2022: Initial biopsy was DCIS ER 30% PR 2%)   Treatment plan: Adjuvant chemotherapy with dose dense Adriamycin and Cytoxan followed by Taxol and carboplatin Adjuvant radiation therapy ------------------------------------------------- Current treatment: Dose dense Adriamycin and Cytoxan started January 01, 2023.  Today is cycle 3 Chemo toxicities: Fatigue Insomnia Anxiety Hospitalization 01/21/2023 for nasal burning, chest discomfort leukocytosis.  Workup was negative, felt to be related to Neulasta and Cytoxan.  We will reduce the dosage of Cytoxan and will slow the infusion rate down.  Encouraged the patient to take Claritin and Tylenol/ibuprofen for the bone pain. Weight gain: Patient's husband is very worried about the weight she is gaining.  I discussed with her that it is important to keep her weight under control.  We would like to slow down her Cytoxan infusion. Also reducing the dosage of Adriamycin and Cytoxan. Return to clinic in 2 weeks for cycle 3    Orders Placed This Encounter  Procedures   CBC with Differential (Cancer Center  Only)    Standing Status:   Future    Standing Expiration Date:   03/05/2024   CMP (Cancer Center only)    Standing Status:   Future    Standing Expiration Date:   03/05/2024   CBC with Differential (Cancer Center Only)    Standing Status:   Future    Standing Expiration Date:   03/12/2024   CMP (Cancer Center only)    Standing Status:   Future    Standing Expiration Date:   03/12/2024   CBC with Differential (Cancer Center Only)    Standing Status:   Future    Standing Expiration Date:   03/19/2024   CMP (Cancer Center only)    Standing Status:   Future    Standing Expiration Date:   03/19/2024   CBC with Differential (Cancer Center Only)    Standing Status:   Future    Standing Expiration Date:   03/26/2024   CMP (Cancer Center only)    Standing Status:   Future    Standing Expiration Date:   03/26/2024   CBC with Differential (Cancer Center Only)    Standing Status:   Future    Standing Expiration Date:   04/02/2024   CMP (Cancer Center only)    Standing Status:   Future    Standing Expiration Date:   04/02/2024   CBC with Differential (Cancer Center Only)    Standing Status:   Future    Standing Expiration Date:   04/09/2024   CMP (Cancer Center only)    Standing Status:   Future    Standing Expiration Date:   04/09/2024   CBC with Differential (Cancer Center Only)    Standing Status:   Future    Standing Expiration Date:   04/16/2024   CMP (Cancer Center only)    Standing Status:   Future    Standing Expiration Date:   04/16/2024   The patient has a good understanding of the overall plan. she agrees with it. she will call with  any problems that may develop before the next visit here. Total time spent: 30 mins including face to face time and time spent for planning, charting and co-ordination of care   Laurie Meek, MD 01/29/23    I Laurie Bell am acting as a Neurosurgeon for [employer]  I have reviewed the above documentation for accuracy and completeness, and  I agree with the above.

## 2023-01-29 NOTE — Assessment & Plan Note (Addendum)
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, DCIS posterior margin positive, lymphovascular invasion not identified, ER 0%, PR 0%, Ki-67 85%, HER2 0 by IHC Margin excision: Posterior margin: Scattered foci of high-grade DCIS, 0/2 lymph nodes negative, final margins negative  (10/02/2022: Initial biopsy was DCIS ER 30% PR 2%)   Treatment plan: Adjuvant chemotherapy with dose dense Adriamycin and Cytoxan followed by Taxol and carboplatin Adjuvant radiation therapy ------------------------------------------------- Current treatment: Dose dense Adriamycin and Cytoxan started January 01, 2023.  Today is cycle 3 Chemo toxicities: Fatigue Insomnia Anxiety Hospitalization 01/21/2023 for nasal burning, chest discomfort leukocytosis.  Workup was negative, felt to be related to Neulasta and Cytoxan.  We will reduce the dosage of Cytoxan and will slow the infusion rate down.  Encouraged the patient to take Claritin and Tylenol/ibuprofen for the bone pain. Weight gain: Patient's husband is very worried about the weight she is gaining.  I discussed with her that it is important to keep her weight under control.   Return to clinic in 2 weeks for cycle 3

## 2023-01-29 NOTE — Discharge Summary (Signed)
Physician Discharge Summary   Patient: Laurie Bell MRN: [id number] DOB: [birth year]  Admit date:     01/21/2023  Discharge date: 01/23/2023  Discharge Physician: Lynden Oxford  PCP: Hoy Register, MD  Recommendations at discharge:  Follow up with oncology as recommended  Discharge Diagnoses: Principal Problem:   SIRS (systemic inflammatory response syndrome) (HCC) Active Problems:   Hypokalemia   Hyperthyroidism   Malignant neoplasm of upper-outer quadrant of right breast in female, estrogen receptor negative (HCC)   Lower extremity pain, anterior, right  Brief hospital course: PMH of anxiety, GERD, hypothyroidism, right breast cancer SP lumpectomy on chemotherapy with Port-A-Cath.  Present to the hospital with complaints of chest pain as well as right knee pain. Chest pain appears to be related to mucositis/GERD. Right knee pain appears to be related to Neulasta injections. Oncology consult appreciated.  Assessment and Plan: Chemotherapy associated mucositis. Oral candidiasis Chemotherapy session on 8/6. Appears to have developed worsening acid reflux/burning chest pain since then. Oral mucosa also appears to have some thrush. Will treat with Diflucan, twice daily PPI and Carafate and monitor response. Continue l lidocaine as needed.  Right knee pain. Presents with complaints of swelling of the right thigh as well as right knee pain. Prior to her this admission, patient was also seen in ER with left knee pain after her first session of chemotherapy. Suspect this is related to Neulasta injections. I do not appreciate any swelling or redness or warmth or erythema or rash or lumps or bumps at the time of my evaluation. No imaging necessary for now. X-ray knee already unremarkable. Topical treatment for pain.  Nasal burning. Likely associated with Cytoxan toxicity. Oncology aware.  Appreciate assistance.  SIRS. Sepsis ruled out. Patient was started on IV antibiotics  with concerns for sepsis. Her leukocytosis is related to Neulasta. Currently no evidence of active infection based on a CT scan of the chest as well as abdomen. At present we will hold antibiotics and monitor. Viral workup is also negative.  Hypothyroidism. On methimazole. Which I will continue.  Anxiety. Continue home regimen.  Obesity Body mass index is 34.17 kg/m.  Placing the pt at higher risk of poor outcomes.    Consultants:  Oncology   Procedures performed:  none  DISCHARGE MEDICATION: Allergies as of 01/23/2023       Reactions   Latex Other (See Comments)   Burn in vaginal area with latex condoms   Tramadol Other (See Comments)   States "it messes me up" no further info        Medication List     STOP taking these medications    esomeprazole 40 MG capsule Commonly known as: NEXIUM       TAKE these medications    acetaminophen 500 MG tablet Commonly known as: TYLENOL Take 1,000 mg by mouth every 6 (six) hours as needed for moderate pain.   albuterol 108 (90 Base) MCG/ACT inhaler Commonly known as: VENTOLIN HFA TAKE 2 PUFFS BY MOUTH EVERY 6 HOURS AS NEEDED FOR WHEEZE OR SHORTNESS OF BREATH   cyclobenzaprine 10 MG tablet Commonly known as: FLEXERIL TAKE 1 TABLET BY MOUTH AT BEDTIME AS NEEDED FOR MUSCLE SPASMS   dexamethasone 4 MG tablet Commonly known as: DECADRON Take 1 tablet day after chemo and 1 tablet 2 days after chemo with food   DULoxetine 60 MG capsule Commonly known as: Cymbalta Take 1 capsule (60 mg total) by mouth daily.   fluconazole 200 MG tablet Commonly known as: DIFLUCAN Take  1 tablet (200 mg total) by mouth daily for 5 days.   fluticasone 50 MCG/ACT nasal spray Commonly known as: FLONASE Place 2 sprays into both nostrils daily.   gabapentin 400 MG capsule Commonly known as: NEURONTIN Take 400 mg by mouth 3 (three) times daily as needed (pain).   hydrocortisone 2.5 % rectal cream Commonly known as:  ANUSOL-HC Place rectally 4 (four) times daily as needed for hemorrhoids or anal itching.   hydrOXYzine 25 MG tablet Commonly known as: ATARAX TAKE 1 TABLET(25 MG) BY MOUTH THREE TIMES DAILY   ipratropium 0.03 % nasal spray Commonly known as: ATROVENT Place 2 sprays into both nostrils every 12 (twelve) hours.   lidocaine 2 % solution Commonly known as: XYLOCAINE Use as directed 15 mLs in the mouth or throat every 4 (four) hours as needed for mouth pain.   linaclotide 145 MCG Caps capsule Commonly known as: Linzess Take 1 capsule (145 mcg total) by mouth daily before breakfast.   methimazole 5 MG tablet Commonly known as: TAPAZOLE Take 1 tablet (5 mg total) by mouth as directed. 1 tablet Monday through Friday, skip Saturdays and  Sundays   metoprolol succinate 25 MG 24 hr tablet Commonly known as: TOPROL-XL Take 0.5 tablets (12.5 mg total) by mouth daily. For increased heart rate   naproxen 500 MG tablet Commonly known as: NAPROSYN TAKE 1 TABLET BY MOUTH TWICE A DAY AS NEEDED FOR PAIN   ondansetron 8 MG tablet Commonly known as: Zofran Take 1 tab (8 mg) by mouth every 8 hrs as needed for nausea/vomiting. Start third day after doxorubicin/cyclophosphamide chemotherapy.   pantoprazole 40 MG tablet Commonly known as: PROTONIX Take 1 tablet (40 mg total) by mouth 2 (two) times daily before a meal for 13 days.   potassium chloride 10 MEQ tablet Commonly known as: KLOR-CON Take 10 mEq by mouth daily.   prochlorperazine 10 MG tablet Commonly known as: COMPAZINE Take 1 tablet (10 mg total) by mouth every 6 (six) hours as needed for nausea or vomiting.   sucralfate 1 g tablet Commonly known as: Carafate Take 1 tablet (1 g total) by mouth 4 (four) times daily for 14 days.   zolpidem 5 MG tablet Commonly known as: AMBIEN Take 1 tablet (5 mg total) by mouth at bedtime as needed for sleep.       Disposition: Home Diet recommendation: Cardiac diet  Discharge  Exam: Vitals:   01/22/23 2025 01/23/23 0359 01/23/23 0720 01/23/23 1453  BP: 107/89 108/81  122/76  Pulse: (!) 110 91  96  Resp: 18 18 19 20   Temp: 98.4 F (36.9 C) 98.4 F (36.9 C)  98.4 F (36.9 C)  TempSrc: Oral Oral  Oral  SpO2: 100% 100%  100%  Weight:      Height:       General: Appear in no distress; no visible Abnormal Neck Mass Or lumps, Conjunctiva normal Cardiovascular: S1 and S2 Present, no Murmur, Respiratory: good respiratory effort, Bilateral Air entry present and CTA, no Crackles, no wheezes Abdomen: Bowel Sound present, Non tender  Extremities: no Pedal edema Neurology: alert and oriented to time, place, and person  Filed Weights   01/21/23 1834 01/22/23 0131  Weight: 91.6 kg 96 kg   Condition at discharge: stable  The results of significant diagnostics from this hospitalization (including imaging, microbiology, ancillary and laboratory) are listed below for reference.   Imaging Studies: VAS [place] LOWER EXTREMITY VENOUS (DVT)  Result Date: 01/23/2023  Lower Venous DVT Study Patient  Name:  SHAWNDA DOVEL  Date of Exam:   01/21/2023 Medical Rec #: [id number]          Accession #:    [id number] Date of Birth: 1981-01-21         Patient Gender: F Patient Age:   72 years Exam Location:  [facility] Procedure:      VAS [place] LOWER EXTREMITY VENOUS (DVT) Referring Phys: Nehemiah Settle SMALL --------------------------------------------------------------------------------  Indications: Thigh pain.  Risk Factors: Cancer: Breast. Comparison Study: Prior negative left LEV done 01/10/23 Performing Technologist: Sherren Kerns RVS  Examination Guidelines: A complete evaluation includes B-mode imaging, spectral Doppler, color Doppler, and power Doppler as needed of all accessible portions of each vessel. Bilateral testing is considered an integral part of a complete examination. Limited examinations for reoccurring indications may be performed as noted. The reflux portion of the exam is  performed with the patient in reverse Trendelenburg.  +---------+---------------+---------+-----------+----------+--------------+ RIGHT    CompressibilityPhasicitySpontaneityPropertiesThrombus Aging +---------+---------------+---------+-----------+----------+--------------+ CFV      Full           Yes      Yes                                 +---------+---------------+---------+-----------+----------+--------------+ SFJ      Full                                                        +---------+---------------+---------+-----------+----------+--------------+ FV Prox  Full                                                        +---------+---------------+---------+-----------+----------+--------------+ FV Mid   Full                                                        +---------+---------------+---------+-----------+----------+--------------+ FV DistalFull                                                        +---------+---------------+---------+-----------+----------+--------------+ PFV      Full                                                        +---------+---------------+---------+-----------+----------+--------------+ POP      Full           Yes      Yes                                 +---------+---------------+---------+-----------+----------+--------------+ PTV      Full                                                        +---------+---------------+---------+-----------+----------+--------------+  PERO     Full                                                        +---------+---------------+---------+-----------+----------+--------------+   +----+---------------+---------+-----------+----------+--------------+ LEFTCompressibilityPhasicitySpontaneityPropertiesThrombus Aging +----+---------------+---------+-----------+----------+--------------+ CFV Full           Yes      Yes                                  +----+---------------+---------+-----------+----------+--------------+     Summary: RIGHT: - There is no evidence of deep vein thrombosis in the lower extremity.  - No cystic structure found in the popliteal fossa.  LEFT: - No evidence of common femoral vein obstruction.  *See table(s) above for measurements and observations. Electronically signed by Coral Else MD on 01/23/2023 at 9:08:15 PM.    Final    ECHOCARDIOGRAM COMPLETE  Result Date: 01/22/2023    ECHOCARDIOGRAM REPORT   Patient Name:   ROSANGEL BORNHOLDT Date of Exam: 01/22/2023 Medical Rec #:  [id number]         Height:       66.0 in Accession #:    [id number]        Weight:       211.7 lb Date of Birth:  08-09-1980        BSA:          2.049 m Patient Age:    41 years          BP:           111/85 mmHg Patient Gender: F                 HR:           55 bpm. Exam Location:  Inpatient Procedure: 2D Echo, Cardiac Doppler and Color Doppler Indications:    Chest Pain R07.9  History:        Patient has prior history of Echocardiogram examinations, most                 recent 12/29/2022.  Sonographer:    Harriette Bouillon RDCS Referring Phys: [id number] CHING T TU IMPRESSIONS  1. Left ventricular ejection fraction, by estimation, is 60 to 65%. The left ventricle has normal function. The left ventricle has no regional wall motion abnormalities. Left ventricular diastolic parameters were normal.  2. Right ventricular systolic function is normal. The right ventricular size is normal.  3. The mitral valve is normal in structure. No evidence of mitral valve regurgitation. No evidence of mitral stenosis.  4. The aortic valve is normal in structure. Aortic valve regurgitation is not visualized. No aortic stenosis is present.  5. The inferior vena cava is normal in size with greater than 50% respiratory variability, suggesting right atrial pressure of 3 mmHg. FINDINGS  Left Ventricle: Left ventricular ejection fraction, by estimation, is 60 to 65%. The left ventricle has  normal function. The left ventricle has no regional wall motion abnormalities. The left ventricular internal cavity size was normal in size. There is  no left ventricular hypertrophy. Left ventricular diastolic parameters were normal. Right Ventricle: The right ventricular size is normal. No increase in right ventricular wall thickness. Right ventricular systolic function is normal. Left Atrium: Left atrial size was normal in  size. Right Atrium: Right atrial size was normal in size. Pericardium: There is no evidence of pericardial effusion. Mitral Valve: The mitral valve is normal in structure. No evidence of mitral valve regurgitation. No evidence of mitral valve stenosis. Tricuspid Valve: The tricuspid valve is normal in structure. Tricuspid valve regurgitation is not demonstrated. No evidence of tricuspid stenosis. Aortic Valve: The aortic valve is normal in structure. Aortic valve regurgitation is not visualized. No aortic stenosis is present. Pulmonic Valve: The pulmonic valve was normal in structure. Pulmonic valve regurgitation is not visualized. No evidence of pulmonic stenosis. Aorta: The aortic root is normal in size and structure. Venous: The inferior vena cava is normal in size with greater than 50% respiratory variability, suggesting right atrial pressure of 3 mmHg. IAS/Shunts: No atrial level shunt detected by color flow Doppler.  LEFT VENTRICLE PLAX 2D LVIDd:         4.40 cm   Diastology LVIDs:         2.50 cm   LV e' lateral:   8.38 cm/s LV PW:         0.70 cm   LV E/e' lateral: 9.0 LV IVS:        0.70 cm LVOT diam:     2.10 cm LV SV:         69 LV SV Index:   33 LVOT Area:     3.46 cm  IVC IVC diam: 1.30 cm LEFT ATRIUM             Index LA diam:        3.10 cm 1.51 cm/m LA Vol (A2C):   29.0 ml 14.16 ml/m LA Vol (A4C):   55.6 ml 27.14 ml/m LA Biplane Vol: 43.5 ml 21.23 ml/m  AORTIC VALVE LVOT Vmax:   118.00 cm/s LVOT Vmean:  83.300 cm/s LVOT VTI:    0.198 m  AORTA Ao Root diam: 2.60 cm Ao Asc  diam:  3.10 cm MITRAL VALVE MV Area (PHT): 4.57 cm    SHUNTS MV Decel Time: 166 msec    Systemic VTI:  0.20 m MV E velocity: 75.70 cm/s  Systemic Diam: 2.10 cm MV A velocity: 66.00 cm/s MV E/A ratio:  1.15 Kardie Tobb DO Electronically signed by Thomasene Ripple DO Signature Date/Time: 01/22/2023/2:32:49 PM    Final    CT Angio Chest PE W and/or Wo Contrast  Result Date: 01/21/2023 CLINICAL DATA:  Chest and abdominal pain following chemotherapy 5 days ago, initial encounter EXAM: CT ANGIOGRAPHY CHEST CT ABDOMEN AND PELVIS WITH CONTRAST TECHNIQUE: Multidetector CT imaging of the chest was performed using the standard protocol during bolus administration of intravenous contrast. Multiplanar CT image reconstructions and MIPs were obtained to evaluate the vascular anatomy. Multidetector CT imaging of the abdomen and pelvis was performed using the standard protocol during bolus administration of intravenous contrast. RADIATION DOSE REDUCTION: This exam was performed according to the departmental dose-optimization program which includes automated exposure control, adjustment of the mA and/or kV according to patient size and/or use of iterative reconstruction technique. CONTRAST:  75mL OMNIPAQUE IOHEXOL 350 MG/ML SOLN COMPARISON:  None Available. FINDINGS: CTA CHEST FINDINGS Cardiovascular: Thoracic aorta shows no aneurysmal dilatation or dissection. No cardiac enlargement is seen. The pulmonary artery shows a normal branching pattern bilaterally. No filling defect to suggest pulmonary embolism is noted. No coronary calcifications are seen. Left chest wall port is noted in satisfactory position. No significant inflammatory changes are noted surrounding the port. Mediastinum/Nodes: Thoracic inlet is within normal limits.  No hilar or mediastinal adenopathy is noted. The esophagus as visualized is within normal limits. Lungs/Pleura: Lungs are well aerated bilaterally. No focal infiltrate or effusion is seen. Few small nodular  densities are noted adjacent to the major fissure on the left consistent with Peri fissural lymph nodes. Musculoskeletal: Postsurgical changes are noted in the right breast. No acute bony abnormality is noted. Review of the MIP images confirms the above findings. CT ABDOMEN and PELVIS FINDINGS Hepatobiliary: No focal liver abnormality is seen. No gallstones, gallbladder wall thickening, or biliary dilatation. Pancreas: Unremarkable. No pancreatic ductal dilatation or surrounding inflammatory changes. Spleen: Normal in size without focal abnormality. Adrenals/Urinary Tract: Adrenal glands are within normal limits. Kidneys demonstrate a normal enhancement pattern bilaterally. The bladder is within normal limits. Stomach/Bowel: No obstructive or inflammatory changes of the colon are seen. The appendix is within normal limits. Small bowel and stomach are unremarkable. Vascular/Lymphatic: No significant vascular findings are present. No enlarged abdominal or pelvic lymph nodes. Incidental note is made of a left retroaortic renal vein. Reproductive: Uterus and bilateral adnexa are unremarkable. Other: No abdominal wall hernia or abnormality. No abdominopelvic ascites. Musculoskeletal: Stable sclerotic lesions consistent with bone islands are noted. No acute bony abnormality is seen. Review of the MIP images confirms the above findings. IMPRESSION: CTA of the chest: No evidence of pulmonary emboli. No significant inflammatory changes surrounding the known left chest wall port are seen. CT of the abdomen and pelvis: No acute abnormality noted. Electronically Signed   By: Alcide Clever M.D.   On: 01/21/2023 21:32   CT ABDOMEN PELVIS W CONTRAST  Result Date: 01/21/2023 CLINICAL DATA:  Chest and abdominal pain following chemotherapy 5 days ago, initial encounter EXAM: CT ANGIOGRAPHY CHEST CT ABDOMEN AND PELVIS WITH CONTRAST TECHNIQUE: Multidetector CT imaging of the chest was performed using the standard protocol during  bolus administration of intravenous contrast. Multiplanar CT image reconstructions and MIPs were obtained to evaluate the vascular anatomy. Multidetector CT imaging of the abdomen and pelvis was performed using the standard protocol during bolus administration of intravenous contrast. RADIATION DOSE REDUCTION: This exam was performed according to the departmental dose-optimization program which includes automated exposure control, adjustment of the mA and/or kV according to patient size and/or use of iterative reconstruction technique. CONTRAST:  75mL OMNIPAQUE IOHEXOL 350 MG/ML SOLN COMPARISON:  None Available. FINDINGS: CTA CHEST FINDINGS Cardiovascular: Thoracic aorta shows no aneurysmal dilatation or dissection. No cardiac enlargement is seen. The pulmonary artery shows a normal branching pattern bilaterally. No filling defect to suggest pulmonary embolism is noted. No coronary calcifications are seen. Left chest wall port is noted in satisfactory position. No significant inflammatory changes are noted surrounding the port. Mediastinum/Nodes: Thoracic inlet is within normal limits. No hilar or mediastinal adenopathy is noted. The esophagus as visualized is within normal limits. Lungs/Pleura: Lungs are well aerated bilaterally. No focal infiltrate or effusion is seen. Few small nodular densities are noted adjacent to the major fissure on the left consistent with Peri fissural lymph nodes. Musculoskeletal: Postsurgical changes are noted in the right breast. No acute bony abnormality is noted. Review of the MIP images confirms the above findings. CT ABDOMEN and PELVIS FINDINGS Hepatobiliary: No focal liver abnormality is seen. No gallstones, gallbladder wall thickening, or biliary dilatation. Pancreas: Unremarkable. No pancreatic ductal dilatation or surrounding inflammatory changes. Spleen: Normal in size without focal abnormality. Adrenals/Urinary Tract: Adrenal glands are within normal limits. Kidneys  demonstrate a normal enhancement pattern bilaterally. The bladder is within normal limits. Stomach/Bowel: No  obstructive or inflammatory changes of the colon are seen. The appendix is within normal limits. Small bowel and stomach are unremarkable. Vascular/Lymphatic: No significant vascular findings are present. No enlarged abdominal or pelvic lymph nodes. Incidental note is made of a left retroaortic renal vein. Reproductive: Uterus and bilateral adnexa are unremarkable. Other: No abdominal wall hernia or abnormality. No abdominopelvic ascites. Musculoskeletal: Stable sclerotic lesions consistent with bone islands are noted. No acute bony abnormality is seen. Review of the MIP images confirms the above findings. IMPRESSION: CTA of the chest: No evidence of pulmonary emboli. No significant inflammatory changes surrounding the known left chest wall port are seen. CT of the abdomen and pelvis: No acute abnormality noted. Electronically Signed   By: Alcide Clever M.D.   On: 01/21/2023 21:32   DG Chest Port 1 View  Result Date: 01/21/2023 CLINICAL DATA:  ON CHEMOTHERAPY, WITH RIGHT THIGH PAIN AND SWELLING, RIGHT KNEE PAIN, AND CHEST PAIN AT PORT SITE. [id number]. EXAM: PORTABLE CHEST 1 VIEW CR- RIGHT FEMUR 2 VIEWS CR- RIGHT KNEE - COMPLETE 4+ VIEW COMPARISON:  PA Lat chest 11/25/2022. No prior right femur or right knee films. FINDINGS: Chest AP portable, 7:35 p.m.: The heart size and mediastinal contours are within normal limits. Both lungs are clear. The visualized skeletal structures are unremarkable. There is new demonstration of a left chest port, with subclavian approach catheter terminating just above the superior cavoatrial junction. Right femur, AP Lat exam, total of 4 films: There is no evidence of fracture, dislocation or other focal bone abnormality. The right pelvic ring is intact. Arthritic changes are not seen. Soft tissues are unremarkable. Right knee, routine four views: There is no evidence of fracture,  dislocation, or joint effusion. No evidence of arthropathy or other focal bone abnormality. Soft tissues are unremarkable. A bone island is incidentally seen in the proximal tibial metaphysis. IMPRESSION: 1. No evidence of acute chest disease. Left chest port. 2. No evidence of acute right femur or right knee fracture or other acute process. Electronically Signed   By: Almira Bar M.D.   On: 01/21/2023 20:09   DG Knee Complete 4 Views Right  Result Date: 01/21/2023 CLINICAL DATA:  ON CHEMOTHERAPY, WITH RIGHT THIGH PAIN AND SWELLING, RIGHT KNEE PAIN, AND CHEST PAIN AT PORT SITE. [id number]. EXAM: PORTABLE CHEST 1 VIEW CR- RIGHT FEMUR 2 VIEWS CR- RIGHT KNEE - COMPLETE 4+ VIEW COMPARISON:  PA Lat chest 11/25/2022. No prior right femur or right knee films. FINDINGS: Chest AP portable, 7:35 p.m.: The heart size and mediastinal contours are within normal limits. Both lungs are clear. The visualized skeletal structures are unremarkable. There is new demonstration of a left chest port, with subclavian approach catheter terminating just above the superior cavoatrial junction. Right femur, AP Lat exam, total of 4 films: There is no evidence of fracture, dislocation or other focal bone abnormality. The right pelvic ring is intact. Arthritic changes are not seen. Soft tissues are unremarkable. Right knee, routine four views: There is no evidence of fracture, dislocation, or joint effusion. No evidence of arthropathy or other focal bone abnormality. Soft tissues are unremarkable. A bone island is incidentally seen in the proximal tibial metaphysis. IMPRESSION: 1. No evidence of acute chest disease. Left chest port. 2. No evidence of acute right femur or right knee fracture or other acute process. Electronically Signed   By: Almira Bar M.D.   On: 01/21/2023 20:09   DG Femur Min 2 Views Right  Result Date: 01/21/2023 CLINICAL DATA:  ON  CHEMOTHERAPY, WITH RIGHT THIGH PAIN AND SWELLING, RIGHT KNEE PAIN, AND CHEST PAIN AT  PORT SITE. [id number]. EXAM: PORTABLE CHEST 1 VIEW CR- RIGHT FEMUR 2 VIEWS CR- RIGHT KNEE - COMPLETE 4+ VIEW COMPARISON:  PA Lat chest 11/25/2022. No prior right femur or right knee films. FINDINGS: Chest AP portable, 7:35 p.m.: The heart size and mediastinal contours are within normal limits. Both lungs are clear. The visualized skeletal structures are unremarkable. There is new demonstration of a left chest port, with subclavian approach catheter terminating just above the superior cavoatrial junction. Right femur, AP Lat exam, total of 4 films: There is no evidence of fracture, dislocation or other focal bone abnormality. The right pelvic ring is intact. Arthritic changes are not seen. Soft tissues are unremarkable. Right knee, routine four views: There is no evidence of fracture, dislocation, or joint effusion. No evidence of arthropathy or other focal bone abnormality. Soft tissues are unremarkable. A bone island is incidentally seen in the proximal tibial metaphysis. IMPRESSION: 1. No evidence of acute chest disease. Left chest port. 2. No evidence of acute right femur or right knee fracture or other acute process. Electronically Signed   By: Almira Bar M.D.   On: 01/21/2023 20:09   US Venous Img Lower Unilateral Left (DVT)  Result Date: 01/10/2023 CLINICAL DATA:  Left lower extremity pain and edema. Evaluate for DVT. EXAM: LEFT LOWER EXTREMITY VENOUS DOPPLER ULTRASOUND TECHNIQUE: Gray-scale sonography with graded compression, as well as color Doppler and duplex ultrasound were performed to evaluate the lower extremity deep venous systems from the level of the common femoral vein and including the common femoral, femoral, profunda femoral, popliteal and calf veins including the posterior tibial, peroneal and gastrocnemius veins when visible. The superficial great saphenous vein was also interrogated. Spectral Doppler was utilized to evaluate flow at rest and with distal augmentation maneuvers in the common  femoral, femoral and popliteal veins. COMPARISON:  None Available. FINDINGS: Contralateral Common Femoral Vein: Respiratory phasicity is normal and symmetric with the symptomatic side. No evidence of thrombus. Normal compressibility. Common Femoral Vein: No evidence of thrombus. Normal compressibility, respiratory phasicity and response to augmentation. Saphenofemoral Junction: No evidence of thrombus. Normal compressibility and flow on color Doppler imaging. Profunda Femoral Vein: No evidence of thrombus. Normal compressibility and flow on color Doppler imaging. Femoral Vein: No evidence of thrombus. Normal compressibility, respiratory phasicity and response to augmentation. Popliteal Vein: No evidence of thrombus. Normal compressibility, respiratory phasicity and response to augmentation. Calf Veins: No evidence of thrombus. Normal compressibility and flow on color Doppler imaging. Superficial Great Saphenous Vein: No evidence of thrombus. Normal compressibility. Other Findings:  None. IMPRESSION: No evidence of DVT within the left lower extremity. Electronically Signed   By: Simonne Come M.D.   On: 01/10/2023 14:40    Microbiology: Results for orders placed or performed during the hospital encounter of 01/21/23  Blood culture (routine x 2)     Status: None   Collection Time: 01/21/23 10:02 PM   Specimen: BLOOD LEFT HAND  Result Value Ref Range Status   Specimen Description   Final    BLOOD LEFT HAND Performed at [facility], [address] [place], [place] [address]    Special Requests   Final    BOTTLES DRAWN AEROBIC AND ANAEROBIC Blood Culture adequate volume Performed at [facility], [address] [place], [place] [address]    Culture   Final    NO GROWTH 5 DAYS Performed at [facility], [address], [place], [place] [address]    Report Status 01/27/2023 FINAL  Final  Blood culture (routine x 2)     Status: None   Collection Time:  01/22/23  4:32 AM   Specimen: BLOOD LEFT HAND  Result Value Ref Range Status   Specimen Description   Final    BLOOD LEFT HAND Performed at [facility], [address], [place], [place] [address]    Special Requests   Final    BOTTLES DRAWN AEROBIC AND ANAEROBIC Blood Culture adequate volume Performed at [facility], [address], [place], [place] [address]    Culture   Final    NO GROWTH 5 DAYS Performed at [facility], [address] [place], [place] [address]    Report Status 01/27/2023 FINAL  Final   Labs: CBC: Recent Labs  Lab 01/23/23 1550  WBC 14.8*  NEUTROABS 10.3*  HGB 12.1  HCT 36.6  MCV 86.1  PLT 320   Basic Metabolic Panel: Recent Labs  Lab 01/23/23 1550  NA 135  K 3.4*  CL 101  CO2 25  GLUCOSE 100*  BUN 9  CREATININE 0.75  CALCIUM 9.0   Liver Function Tests: No results for input(s): "AST", "ALT", "ALKPHOS", "BILITOT", "PROT", "ALBUMIN" in the last 168 hours. CBG: No results for input(s): "GLUCAP" in the last 168 hours.  Discharge time spent: greater than 30 minutes.  Author: Lynden [place], MD  [facility] 01/23/2023

## 2023-01-30 ENCOUNTER — Inpatient Hospital Stay: Payer: 59

## 2023-01-30 VITALS — BP 125/87 | HR 96 | Temp 97.3°F | Resp 16

## 2023-01-30 DIAGNOSIS — C50411 Malignant neoplasm of upper-outer quadrant of right female breast: Secondary | ICD-10-CM

## 2023-01-30 DIAGNOSIS — Z79899 Other long term (current) drug therapy: Secondary | ICD-10-CM | POA: Diagnosis not present

## 2023-01-30 DIAGNOSIS — F419 Anxiety disorder, unspecified: Secondary | ICD-10-CM | POA: Diagnosis not present

## 2023-01-30 DIAGNOSIS — Z5189 Encounter for other specified aftercare: Secondary | ICD-10-CM | POA: Diagnosis not present

## 2023-01-30 DIAGNOSIS — Z5111 Encounter for antineoplastic chemotherapy: Secondary | ICD-10-CM | POA: Diagnosis not present

## 2023-01-30 DIAGNOSIS — R5383 Other fatigue: Secondary | ICD-10-CM | POA: Diagnosis not present

## 2023-01-30 DIAGNOSIS — G4700 Insomnia, unspecified: Secondary | ICD-10-CM | POA: Diagnosis not present

## 2023-01-30 DIAGNOSIS — D0511 Intraductal carcinoma in situ of right breast: Secondary | ICD-10-CM | POA: Diagnosis not present

## 2023-01-30 MED ORDER — SODIUM CHLORIDE 0.9% FLUSH
10.0000 mL | INTRAVENOUS | Status: DC | PRN
  Administered 2023-01-30: 10 mL

## 2023-01-30 MED ORDER — DOXORUBICIN HCL CHEMO IV INJECTION 2 MG/ML
50.0000 mg/m2 | Freq: Once | INTRAVENOUS | Status: AC
  Administered 2023-01-30: 106 mg via INTRAVENOUS
  Filled 2023-01-30: qty 53

## 2023-01-30 MED ORDER — SODIUM CHLORIDE 0.9 % IV SOLN
500.0000 mg/m2 | Freq: Once | INTRAVENOUS | Status: AC
  Administered 2023-01-30: 1000 mg via INTRAVENOUS
  Filled 2023-01-30: qty 50

## 2023-01-30 MED ORDER — SODIUM CHLORIDE 0.9 % IV SOLN
10.0000 mg | Freq: Once | INTRAVENOUS | Status: AC
  Administered 2023-01-30: 10 mg via INTRAVENOUS
  Filled 2023-01-30: qty 10

## 2023-01-30 MED ORDER — FAMOTIDINE IN NACL 20-0.9 MG/50ML-% IV SOLN
20.0000 mg | Freq: Once | INTRAVENOUS | Status: AC
  Administered 2023-01-30: 20 mg via INTRAVENOUS
  Filled 2023-01-30: qty 50

## 2023-01-30 MED ORDER — HEPARIN SOD (PORK) LOCK FLUSH 100 UNIT/ML IV SOLN
500.0000 [IU] | Freq: Once | INTRAVENOUS | Status: AC | PRN
  Administered 2023-01-30: 500 [IU]

## 2023-01-30 MED ORDER — PALONOSETRON HCL INJECTION 0.25 MG/5ML
0.2500 mg | Freq: Once | INTRAVENOUS | Status: AC
  Administered 2023-01-30: 0.25 mg via INTRAVENOUS
  Filled 2023-01-30: qty 5

## 2023-01-30 MED ORDER — SODIUM CHLORIDE 0.9 % IV SOLN
150.0000 mg | Freq: Once | INTRAVENOUS | Status: AC
  Administered 2023-01-30: 150 mg via INTRAVENOUS
  Filled 2023-01-30: qty 150

## 2023-01-30 MED ORDER — SODIUM CHLORIDE 0.9 % IV SOLN: Freq: Once | INTRAVENOUS | Status: AC

## 2023-01-30 NOTE — Patient Instructions (Signed)
[place] [facility]  Discharge Instructions: Thank you for choosing [facility] to provide your oncology and hematology care.   If you have a lab appointment with the Cancer Center, please go directly to the Cancer Center and check in at the registration area.   Wear comfortable clothing and clothing appropriate for easy access to any Portacath or PICC line.   We strive to give you quality time with your provider. You may need to reschedule your appointment if you arrive late (15 or more minutes).  Arriving late affects you and other patients whose appointments are after yours.  Also, if you miss three or more appointments without notifying the office, you may be dismissed from the clinic at the provider's discretion.      For prescription refill requests, have your pharmacy contact our office and allow 72 hours for refills to be completed.    Today you received the following chemotherapy and/or immunotherapy agents: cyclophosphamide and doxorubicin      To help prevent nausea and vomiting after your treatment, we encourage you to take your nausea medication as directed.  BELOW ARE SYMPTOMS THAT SHOULD BE REPORTED IMMEDIATELY: *FEVER GREATER THAN 100.4 F (38 C) OR HIGHER *CHILLS OR SWEATING *NAUSEA AND VOMITING THAT IS NOT CONTROLLED WITH YOUR NAUSEA MEDICATION *UNUSUAL SHORTNESS OF BREATH *UNUSUAL BRUISING OR BLEEDING *URINARY PROBLEMS (pain or burning when urinating, or frequent urination) *BOWEL PROBLEMS (unusual diarrhea, constipation, pain near the anus) TENDERNESS IN MOUTH AND THROAT WITH OR WITHOUT PRESENCE OF ULCERS (sore throat, sores in mouth, or a toothache) UNUSUAL RASH, SWELLING OR PAIN  UNUSUAL VAGINAL DISCHARGE OR ITCHING   Items with * indicate a potential emergency and should be followed up as soon as possible or go to the Emergency Department if any problems should occur.  Please show the CHEMOTHERAPY ALERT CARD or  IMMUNOTHERAPY ALERT CARD at check-in to the Emergency Department and triage nurse.  Should you have questions after your visit or need to cancel or reschedule your appointment, please contact [place] [facility]  Dept: [phone]  and follow the prompts.  Office hours are 8:00 a.m. to 4:30 p.m. Monday - Friday. Please note that voicemails left after 4:00 p.m. may not be returned until the following business day.  We are closed weekends and major holidays. You have access to a nurse at all times for urgent questions. Please call the main number to the clinic Dept: [phone] and follow the prompts.   For any non-urgent questions, you may also contact your provider using MyChart. We now offer e-Visits for anyone 89 and older to request care online for non-urgent symptoms. For details visit [email or web address].   Also download the MyChart app! Go to the app store, search "MyChart", open the app, select [place], and log in with your MyChart username and password.

## 2023-01-30 NOTE — Patient Outreach (Signed)
  Care Coordination   Follow Up Visit Note   01/29/2023 Name: Laurie Bell MRN: [id number] DOB: September 28, 1980  Rondel Baton Feldner is a 42 y.o. year old female who sees Hoy Register, MD for primary care. I spoke with  [name] by phone today.  What matters to the patients health and wellness today?  Supportive Resources    Goals Addressed             This Visit's Progress    Obtain Supportive Resources-Finances   On track    Activities and task to complete in order to accomplish goals.   Keep all upcoming appointments discussed today Continue with compliance of taking medication prescribed by Doctor Implement healthy coping skills discussed to assist with management of symptoms Follow up with Department of Social Services          SDOH assessments and interventions completed:  No     Care Coordination Interventions:  Yes, provided  Interventions Today    Flowsheet Row Most Recent Value  Chronic Disease   Chronic disease during today's visit Other  [GAD, MDD, Malignant neoplasm of upper-outer quadrant of right breast in female, estrogen receptor negative]  General Interventions   General Interventions Discussed/Reviewed General Interventions Reviewed, [facility], Doctor Visits  [Pt has applied for Medicaid and awaiting decision. Agreed to f/up with oncologist and/or SW regarding financial assistance]  Doctor Visits Discussed/Reviewed Doctor Visits Reviewed  Mental Health Interventions   Mental Health Discussed/Reviewed Mental Health Reviewed, Coping Strategies, Anxiety, Depression       Follow up plan: Follow up call scheduled for 2-6 weeks    Encounter Outcome:  Pt. Visit Completed   Jenel Lucks, MSW, LCSW [facility] Care Management [facility]  [facility] [email or web address] Phone [phone] 11:32 PM

## 2023-01-30 NOTE — Progress Notes (Signed)
Pt c/o heartburn. Verbal order for IV pepcid obtained from Karie Fetch, PA-C.

## 2023-01-30 NOTE — Patient Instructions (Signed)
Visit Information  Thank you for taking time to visit with me today. Please don't hesitate to contact me if I can be of assistance to you.   Following are the goals we discussed today:   Goals Addressed             This Visit's Progress    Obtain Supportive Resources-Finances   On track    Activities and task to complete in order to accomplish goals.   Keep all upcoming appointments discussed today Continue with compliance of taking medication prescribed by Doctor Implement healthy coping skills discussed to assist with management of symptoms Follow up with Department of Social Services          Our next appointment is by telephone on 08/29 at 11:30 AM  Please call the care guide team at [phone] if you need to cancel or reschedule your appointment.   If you are experiencing a Mental Health or Behavioral Health Crisis or need someone to talk to, please call the Suicide and Crisis Lifeline: 988 call 911   Patient verbalizes understanding of instructions and care plan provided today and agrees to view in MyChart. Active MyChart status and patient understanding of how to access instructions and care plan via MyChart confirmed with patient.     Jenel Lucks, MSW, LCSW [facility] Care Management [place]  [facility] [email or web address] Phone [phone] 11:32 PM

## 2023-02-01 ENCOUNTER — Encounter: Payer: Self-pay | Admitting: Hematology and Oncology

## 2023-02-01 ENCOUNTER — Inpatient Hospital Stay: Payer: 59

## 2023-02-01 VITALS — BP 119/89 | HR 81 | Temp 98.2°F | Resp 16

## 2023-02-01 DIAGNOSIS — R5383 Other fatigue: Secondary | ICD-10-CM | POA: Diagnosis not present

## 2023-02-01 DIAGNOSIS — Z5189 Encounter for other specified aftercare: Secondary | ICD-10-CM | POA: Diagnosis not present

## 2023-02-01 DIAGNOSIS — C50411 Malignant neoplasm of upper-outer quadrant of right female breast: Secondary | ICD-10-CM

## 2023-02-01 DIAGNOSIS — G4700 Insomnia, unspecified: Secondary | ICD-10-CM | POA: Diagnosis not present

## 2023-02-01 DIAGNOSIS — F419 Anxiety disorder, unspecified: Secondary | ICD-10-CM | POA: Diagnosis not present

## 2023-02-01 DIAGNOSIS — Z5111 Encounter for antineoplastic chemotherapy: Secondary | ICD-10-CM | POA: Diagnosis not present

## 2023-02-01 DIAGNOSIS — D0511 Intraductal carcinoma in situ of right breast: Secondary | ICD-10-CM | POA: Diagnosis not present

## 2023-02-01 DIAGNOSIS — Z79899 Other long term (current) drug therapy: Secondary | ICD-10-CM | POA: Diagnosis not present

## 2023-02-01 MED ORDER — PEGFILGRASTIM-JMDB 6 MG/0.6ML ~~LOC~~ SOSY
6.0000 mg | PREFILLED_SYRINGE | Freq: Once | SUBCUTANEOUS | Status: AC
  Administered 2023-02-01: 6 mg via SUBCUTANEOUS
  Filled 2023-02-01: qty 0.6

## 2023-02-05 ENCOUNTER — Other Ambulatory Visit (HOSPITAL_COMMUNITY): Payer: Self-pay

## 2023-02-05 ENCOUNTER — Encounter: Payer: Self-pay | Admitting: Hematology and Oncology

## 2023-02-06 ENCOUNTER — Encounter: Payer: Self-pay | Admitting: Hematology and Oncology

## 2023-02-08 ENCOUNTER — Ambulatory Visit: Payer: Self-pay | Admitting: Licensed Clinical Social Worker

## 2023-02-08 DIAGNOSIS — C50911 Malignant neoplasm of unspecified site of right female breast: Secondary | ICD-10-CM | POA: Diagnosis not present

## 2023-02-08 NOTE — Patient Instructions (Signed)
Visit Information  Thank you for taking time to visit with me today. Please don't hesitate to contact me if I can be of assistance to you.   Following are the goals we discussed today:   Goals Addressed             This Visit's Progress    Obtain Supportive Resources-Finances   On track    Activities and task to complete in order to accomplish goals.   Keep all upcoming appointments discussed today Continue with compliance of taking medication prescribed by Doctor Implement healthy coping skills discussed to assist with management of symptoms Follow up with Department of Social Services          Our next appointment is by telephone on 09/26 at 11:30 AM  Please call the care guide team at [phone] if you need to cancel or reschedule your appointment.   If you are experiencing a Mental Health or Behavioral Health Crisis or need someone to talk to, please call the Suicide and Crisis Lifeline: 988 call 911   Patient verbalizes understanding of instructions and care plan provided today and agrees to view in MyChart. Active MyChart status and patient understanding of how to access instructions and care plan via MyChart confirmed with patient.     Jenel Lucks, MSW, LCSW [facility] Care Management [place]  [facility] [email or web address] Phone [phone] 3:41 PM

## 2023-02-08 NOTE — Patient Outreach (Signed)
  Care Coordination   Follow Up Visit Note   02/08/2023 Name: Laurie Bell MRN: [id number] DOB: 06/05/1981  Laurie Bell is a 42 y.o. year old female who sees Hoy Register, MD for primary care. I spoke with  [name] by phone today.  What matters to the patients health and wellness today?  Grant Assistance and Pending Medicaid application    Goals Addressed             This Visit's Progress    Obtain Supportive Resources-Finances   On track    Activities and task to complete in order to accomplish goals.   Keep all upcoming appointments discussed today Continue with compliance of taking medication prescribed by Doctor Implement healthy coping skills discussed to assist with management of symptoms Follow up with Department of Social Services          SDOH assessments and interventions completed:  No     Care Coordination Interventions:  Yes, provided  Interventions Today    Flowsheet Row Most Recent Value  Chronic Disease   Chronic disease during today's visit Other  [Cancer, MDD, and GAD]  General Interventions   General Interventions Discussed/Reviewed General Interventions Reviewed, Doctor Visits, Community Resources  Doctor Visits Discussed/Reviewed Doctor Visits Reviewed  Mental Health Interventions   Mental Health Discussed/Reviewed Mental Health Reviewed, Coping Strategies, Depression, Anxiety       Follow up plan: Follow up call scheduled for 4-6 weeks    Encounter Outcome:  Pt. Visit Completed   Jenel Lucks, MSW, LCSW [facility] Care Management [facility]  [facility] [email or web address] Phone [phone] 3:40 PM

## 2023-02-13 NOTE — Progress Notes (Signed)
Patient Care Team: Hoy Register, MD as PCP - General (Family Medicine) Corky Crafts, MD as PCP - Cardiology (Cardiology) Glendale Chard, DO as Consulting Physician (Neurology) Abigail Miyamoto, MD as Consulting Physician (General Surgery) Serena Croissant, MD as Consulting Physician (Hematology and Oncology) Lonie Peak, MD as Attending Physician (Radiation Oncology) Pershing Proud, RN as Oncology Nurse Navigator Donnelly Angelica, RN as Oncology Nurse Navigator  DIAGNOSIS:  Encounter Diagnosis  Name Primary?   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, ER 30% weak, PR 2%   10/11/2022 Cancer Staging   Staging form: Breast, AJCC 8th Edition - Clinical: Stage 0 (cTis (DCIS), cN0, cM0, G3, ER+, PR+, HER2: Not Assessed) - Signed by Serena Croissant, MD on 10/11/2022 Stage prefix: Initial diagnosis Histologic grading system: 3 grade system   10/11/2022 Genetic Testing   Declined hereditary cancer genetic testing    11/13/2022 Surgery   Right lumpectomy: Grade 3 IDC, 2 cm in size inferior and posterior margins are positive, DCIS posterior margin positive, lymphovascular invasion not identified, ER 0%, PR 0%, Ki-67 85%, HER2 0 by [facility]   12/01/2022 Surgery   Margin excision: Posterior margin: Scattered foci of high-grade DCIS, 0/2 lymph nodes negative   01/01/2023 -  Chemotherapy   Patient is on Treatment Plan : BREAST ADJUVANT DOSE DENSE AC q14d / PACLitaxel q7d       CHIEF COMPLIANT: Adriamycin and Cytoxan   INTERVAL HISTORY: Laurie Bell is a 42 y.o. female is here because of  ER/PR negative. She presents to the clinic for a follow-up. Patient reports real bad stomach pain. She is still having burning pain in chest and nose. She says it last for a couple of days. She reports it feels like a cramp and a sharp pain. Complains of leg pain.   ALLERGIES:  is allergic to latex and tramadol.  MEDICATIONS:  Current Outpatient Medications  Medication Sig Dispense Refill   acetaminophen (TYLENOL) 500 MG tablet Take 1,000 mg by mouth every 6 (six) hours as needed for moderate pain.     albuterol (VENTOLIN HFA) 108 (90 Base) MCG/ACT inhaler TAKE 2 PUFFS BY MOUTH EVERY 6 HOURS AS NEEDED FOR WHEEZE OR SHORTNESS OF BREATH 54 each 0   cyclobenzaprine (FLEXERIL) 10 MG tablet TAKE 1 TABLET BY MOUTH AT BEDTIME AS NEEDED FOR MUSCLE SPASMS 30 tablet 1   dexamethasone (DECADRON) 4 MG tablet Take 1 tablet day after chemo and 1 tablet 2 days after chemo with food 30 tablet 0   DULoxetine (CYMBALTA) 60 MG capsule Take 1 capsule (60 mg total) by mouth daily. 90 capsule 1   esomeprazole (NEXIUM) 40 MG capsule Take 40 mg by mouth 2 (two) times daily.     fluticasone (FLONASE) 50 MCG/ACT nasal spray Place 2 sprays into both nostrils daily. 16 g 0   gabapentin (NEURONTIN) 400 MG capsule Take 400 mg by mouth 3 (three) times daily as needed (pain).     hydrocortisone (ANUSOL-HC) 2.5 % rectal cream Place rectally 4 (four) times daily as needed for hemorrhoids or anal itching. 30 g 0   hydrOXYzine (ATARAX) 25 MG tablet TAKE 1 TABLET(25 MG) BY  MOUTH THREE TIMES DAILY 270 tablet 1   lidocaine (XYLOCAINE) 2 % solution Use as directed 15 mLs in the mouth or throat every 4 (four) hours as needed for mouth pain.     linaclotide (LINZESS) 145 MCG CAPS capsule Take 1 capsule (145 mcg total) by mouth daily before breakfast. 30 capsule 5   methimazole (TAPAZOLE) 5 MG tablet Take 1 tablet (5 mg total) by mouth as directed. 1 tablet Monday through Friday, skip Saturdays and  Sundays 60 tablet 3   metoprolol  succinate (TOPROL-XL) 25 MG 24 hr tablet Take 0.5 tablets (12.5 mg total) by mouth daily. For increased heart rate 45 tablet 1   naproxen (NAPROSYN) 500 MG tablet TAKE 1 TABLET BY MOUTH TWICE A DAY AS NEEDED FOR PAIN 60 tablet 1   ondansetron (ZOFRAN) 8 MG tablet Take 1 tab (8 mg) by mouth every 8 hrs as needed for nausea/vomiting. Start third day after doxorubicin/cyclophosphamide chemotherapy. 30 tablet 1   potassium chloride (KLOR-CON) 10 MEQ tablet Take 10 mEq by mouth daily.     prochlorperazine (COMPAZINE) 10 MG tablet Take 1 tablet (10 mg total) by mouth every 6 (six) hours as needed for nausea or vomiting. 30 tablet 1   zolpidem (AMBIEN) 5 MG tablet Take 1 tablet (5 mg total) by mouth at bedtime as needed for sleep. 30 tablet 0   ipratropium (ATROVENT) 0.03 % nasal spray Place 2 sprays into both nostrils every 12 (twelve) hours. (Patient not taking: Reported on 01/21/2023) 30 mL 0   pantoprazole (PROTONIX) 40 MG tablet Take 1 tablet (40 mg total) by mouth 2 (two) times daily before a meal for 13 days. 26 tablet 0   sucralfate (CARAFATE) 1 g tablet Take 1 tablet (1 g total) by mouth 4 (four) times daily for 14 days. 56 tablet 0   No current facility-administered medications for this visit.    PHYSICAL EXAMINATION: ECOG PERFORMANCE STATUS: 1 - Symptomatic but completely ambulatory  Vitals:   02/14/23 1052  BP: 134/84  Pulse: (!) 105  Resp: 19  Temp: (!) 97.2 F (36.2 C)  SpO2: 100%   Filed Weights   02/14/23 1052  Weight: 213 lb 12.8 oz (97 kg)     LABORATORY DATA:  I have reviewed the data as listed    Latest Ref Rng & Units 02/14/2023   10:27 AM 01/29/2023    2:41 PM 01/23/2023    3:50 PM  CMP  Glucose 70 - 99 mg/dL 811  95  914   BUN 6 - 20 mg/dL 8  9  9    Creatinine 0.44 - 1.00 mg/dL 7.82  9.56  2.13   Sodium 135 - 145 mmol/L 141  139  135   Potassium 3.5 - 5.1 mmol/L 3.4  3.7  3.4   Chloride 98 - 111 mmol/L 107  106  101   CO2 22 - 32 mmol/L 26  27  25    Calcium 8.9  - 10.3 mg/dL 9.1  9.1  9.0   Total Protein 6.5 - 8.1 g/dL 6.9  7.1    Total Bilirubin 0.3 - 1.2 mg/dL 0.3  0.3    Alkaline Phos 38 - 126 U/L 134  141    AST 15 - 41 U/L 23  17    ALT 0 - 44 U/L 33  20      Lab Results  Component Value Date   WBC 11.9 (H) 02/14/2023   HGB 11.8 (L) 02/14/2023   HCT 35.0 (  L) 02/14/2023   MCV 85.6 02/14/2023   PLT 180 02/14/2023   NEUTROABS 7.5 02/14/2023    ASSESSMENT & PLAN:  Malignant neoplasm of upper-outer quadrant of right breast in female, estrogen receptor negative (HCC) 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, DCIS posterior margin positive, lymphovascular invasion not identified, ER 0%, PR 0%, Ki-67 85%, HER2 0 by IHC Margin excision: Posterior margin: Scattered foci of high-grade DCIS, 0/2 lymph nodes negative, final margins negative  (10/02/2022: Initial biopsy was DCIS ER 30% PR 2%)   Treatment plan: Adjuvant chemotherapy with dose dense Adriamycin and Cytoxan followed by Taxol and carboplatin Adjuvant radiation therapy ------------------------------------------------- Current treatment: Dose dense Adriamycin and Cytoxan started January 01, 2023.  Today is cycle 4 Chemo toxicities: Fatigue Insomnia Anxiety Hospitalization 01/21/2023 for nasal burning, chest discomfort leukocytosis.  Workup was negative, felt to be related to Neulasta and Cytoxan.  We will reduce the dosage of Cytoxan and will slow the infusion rate down.  Encouraged the patient to take Claritin and Tylenol/ibuprofen for the bone pain. Weight gain:  Abdominal pain: Could be acid reflux but also cramps from chemo. Return to clinic in 2 weeks for cycle 1 Taxol    Orders Placed This Encounter  Procedures   CBC with Differential (Cancer Center Only)    Standing Status:   Future    Standing Expiration Date:   04/23/2024   CMP (Cancer Center only)    Standing Status:   Future    Standing Expiration Date:   04/23/2024    The patient has a good understanding of the overall plan. she agrees with it. she will call with any problems that may develop before the next visit here. Total time spent: 30 mins including face to face time and time spent for planning, charting and co-ordination of care   Tamsen Meek, MD 02/14/23   I Janan Ridge am acting as a Neurosurgeon for [employer]  I have reviewed the above documentation for accuracy and completeness, and I agree with the above.

## 2023-02-14 ENCOUNTER — Inpatient Hospital Stay: Payer: 59

## 2023-02-14 ENCOUNTER — Encounter: Payer: Self-pay | Admitting: *Deleted

## 2023-02-14 ENCOUNTER — Inpatient Hospital Stay: Payer: 59 | Attending: Hematology and Oncology | Admitting: Hematology and Oncology

## 2023-02-14 VITALS — HR 94

## 2023-02-14 VITALS — BP 134/84 | HR 105 | Temp 97.2°F | Resp 19 | Ht 66.0 in | Wt 213.8 lb

## 2023-02-14 DIAGNOSIS — E872 Acidosis, unspecified: Secondary | ICD-10-CM | POA: Diagnosis present

## 2023-02-14 DIAGNOSIS — E0590 Thyrotoxicosis, unspecified without thyrotoxic crisis or storm: Secondary | ICD-10-CM | POA: Diagnosis present

## 2023-02-14 DIAGNOSIS — Z79899 Other long term (current) drug therapy: Secondary | ICD-10-CM

## 2023-02-14 DIAGNOSIS — C50411 Malignant neoplasm of upper-outer quadrant of right female breast: Secondary | ICD-10-CM | POA: Diagnosis not present

## 2023-02-14 DIAGNOSIS — N390 Urinary tract infection, site not specified: Secondary | ICD-10-CM | POA: Diagnosis present

## 2023-02-14 DIAGNOSIS — U071 COVID-19: Secondary | ICD-10-CM | POA: Diagnosis present

## 2023-02-14 DIAGNOSIS — A4189 Other specified sepsis: Secondary | ICD-10-CM | POA: Diagnosis not present

## 2023-02-14 DIAGNOSIS — T451X5A Adverse effect of antineoplastic and immunosuppressive drugs, initial encounter: Secondary | ICD-10-CM | POA: Diagnosis present

## 2023-02-14 DIAGNOSIS — Z800 Family history of malignant neoplasm of digestive organs: Secondary | ICD-10-CM

## 2023-02-14 DIAGNOSIS — Z171 Estrogen receptor negative status [ER-]: Secondary | ICD-10-CM

## 2023-02-14 DIAGNOSIS — K219 Gastro-esophageal reflux disease without esophagitis: Secondary | ICD-10-CM | POA: Diagnosis present

## 2023-02-14 DIAGNOSIS — C50211 Malignant neoplasm of upper-inner quadrant of right female breast: Secondary | ICD-10-CM | POA: Insufficient documentation

## 2023-02-14 DIAGNOSIS — Z803 Family history of malignant neoplasm of breast: Secondary | ICD-10-CM

## 2023-02-14 DIAGNOSIS — A419 Sepsis, unspecified organism: Secondary | ICD-10-CM | POA: Diagnosis not present

## 2023-02-14 DIAGNOSIS — Z8249 Family history of ischemic heart disease and other diseases of the circulatory system: Secondary | ICD-10-CM

## 2023-02-14 DIAGNOSIS — M79662 Pain in left lower leg: Secondary | ICD-10-CM | POA: Diagnosis present

## 2023-02-14 DIAGNOSIS — E860 Dehydration: Secondary | ICD-10-CM | POA: Diagnosis present

## 2023-02-14 DIAGNOSIS — E669 Obesity, unspecified: Secondary | ICD-10-CM | POA: Diagnosis present

## 2023-02-14 DIAGNOSIS — F32A Depression, unspecified: Secondary | ICD-10-CM | POA: Diagnosis present

## 2023-02-14 DIAGNOSIS — B370 Candidal stomatitis: Secondary | ICD-10-CM | POA: Diagnosis present

## 2023-02-14 DIAGNOSIS — E876 Hypokalemia: Secondary | ICD-10-CM | POA: Diagnosis present

## 2023-02-14 DIAGNOSIS — Z5111 Encounter for antineoplastic chemotherapy: Secondary | ICD-10-CM | POA: Insufficient documentation

## 2023-02-14 DIAGNOSIS — Z9221 Personal history of antineoplastic chemotherapy: Secondary | ICD-10-CM

## 2023-02-14 DIAGNOSIS — Z95828 Presence of other vascular implants and grafts: Secondary | ICD-10-CM

## 2023-02-14 DIAGNOSIS — D849 Immunodeficiency, unspecified: Secondary | ICD-10-CM | POA: Diagnosis present

## 2023-02-14 DIAGNOSIS — E039 Hypothyroidism, unspecified: Secondary | ICD-10-CM | POA: Diagnosis present

## 2023-02-14 DIAGNOSIS — Z885 Allergy status to narcotic agent status: Secondary | ICD-10-CM

## 2023-02-14 DIAGNOSIS — Z6834 Body mass index (BMI) 34.0-34.9, adult: Secondary | ICD-10-CM

## 2023-02-14 DIAGNOSIS — Z91040 Latex allergy status: Secondary | ICD-10-CM

## 2023-02-14 LAB — CMP (CANCER CENTER ONLY)
ALT: 33 U/L (ref 0–44)
AST: 23 U/L (ref 15–41)
Albumin: 4.1 g/dL (ref 3.5–5.0)
Alkaline Phosphatase: 134 U/L — ABNORMAL HIGH (ref 38–126)
Anion gap: 8 (ref 5–15)
BUN: 8 mg/dL (ref 6–20)
CO2: 26 mmol/L (ref 22–32)
Calcium: 9.1 mg/dL (ref 8.9–10.3)
Chloride: 107 mmol/L (ref 98–111)
Creatinine: 0.77 mg/dL (ref 0.44–1.00)
GFR, Estimated: >60 mL/min (ref >60)
Glucose, Bld: 113 mg/dL — ABNORMAL HIGH (ref 70–99)
Potassium: 3.4 mmol/L — ABNORMAL LOW (ref 3.5–5.1)
Sodium: 141 mmol/L (ref 135–145)
Total Bilirubin: 0.3 mg/dL (ref 0.3–1.2)
Total Protein: 6.9 g/dL (ref 6.5–8.1)

## 2023-02-14 LAB — CBC WITH DIFFERENTIAL (CANCER CENTER ONLY)
Abs Immature Granulocytes: 1.43 10*3/uL — ABNORMAL HIGH (ref 0.00–0.07)
Basophils Absolute: 0.1 10*3/uL (ref 0.0–0.1)
Basophils Relative: 1 %
Eosinophils Absolute: 0 10*3/uL (ref 0.0–0.5)
Eosinophils Relative: 0 %
HCT: 35 % — ABNORMAL LOW (ref 36.0–46.0)
Hemoglobin: 11.8 g/dL — ABNORMAL LOW (ref 12.0–15.0)
Immature Granulocytes: 12 %
Lymphocytes Relative: 18 %
Lymphs Abs: 2.1 10*3/uL (ref 0.7–4.0)
MCH: 28.9 pg (ref 26.0–34.0)
MCHC: 33.7 g/dL (ref 30.0–36.0)
MCV: 85.6 fL (ref 80.0–100.0)
Monocytes Absolute: 0.8 10*3/uL (ref 0.1–1.0)
Monocytes Relative: 6 %
Neutro Abs: 7.5 10*3/uL (ref 1.7–7.7)
Neutrophils Relative %: 63 %
Platelet Count: 180 10*3/uL (ref 150–400)
RBC: 4.09 MIL/uL (ref 3.87–5.11)
RDW: 16.1 % — ABNORMAL HIGH (ref 11.5–15.5)
Smear Review: NORMAL
WBC Count: 11.9 10*3/uL — ABNORMAL HIGH (ref 4.0–10.5)
nRBC: 0.5 % — ABNORMAL HIGH (ref 0.0–0.2)

## 2023-02-14 MED ORDER — PALONOSETRON HCL INJECTION 0.25 MG/5ML
0.2500 mg | Freq: Once | INTRAVENOUS | Status: AC
  Administered 2023-02-14: 0.25 mg via INTRAVENOUS
  Filled 2023-02-14: qty 5

## 2023-02-14 MED ORDER — SODIUM CHLORIDE 0.9% FLUSH
10.0000 mL | INTRAVENOUS | Status: DC | PRN
  Administered 2023-02-14: 10 mL

## 2023-02-14 MED ORDER — SODIUM CHLORIDE 0.9 % IV SOLN
150.0000 mg | Freq: Once | INTRAVENOUS | Status: AC
  Administered 2023-02-14: 150 mg via INTRAVENOUS
  Filled 2023-02-14: qty 150

## 2023-02-14 MED ORDER — LORAZEPAM 2 MG/ML IJ SOLN
1.0000 mg | Freq: Once | INTRAMUSCULAR | Status: AC
  Administered 2023-02-14: 1 mg via INTRAVENOUS
  Filled 2023-02-14: qty 1

## 2023-02-14 MED ORDER — SODIUM CHLORIDE 0.9 % IV SOLN
500.0000 mg/m2 | Freq: Once | INTRAVENOUS | Status: AC
  Administered 2023-02-14: 1000 mg via INTRAVENOUS
  Filled 2023-02-14: qty 50

## 2023-02-14 MED ORDER — HEPARIN SOD (PORK) LOCK FLUSH 100 UNIT/ML IV SOLN
500.0000 [IU] | Freq: Once | INTRAVENOUS | Status: AC | PRN
  Administered 2023-02-14: 500 [IU]

## 2023-02-14 MED ORDER — SODIUM CHLORIDE 0.9 % IV SOLN: Freq: Once | INTRAVENOUS | Status: AC

## 2023-02-14 MED ORDER — SODIUM CHLORIDE 0.9% FLUSH
10.0000 mL | Freq: Once | INTRAVENOUS | Status: AC
  Administered 2023-02-14: 10 mL

## 2023-02-14 MED ORDER — DOXORUBICIN HCL CHEMO IV INJECTION 2 MG/ML
50.0000 mg/m2 | Freq: Once | INTRAVENOUS | Status: AC
  Administered 2023-02-14: 106 mg via INTRAVENOUS
  Filled 2023-02-14: qty 53

## 2023-02-14 MED ORDER — SODIUM CHLORIDE 0.9 % IV SOLN
10.0000 mg | Freq: Once | INTRAVENOUS | Status: AC
  Administered 2023-02-14: 10 mg via INTRAVENOUS
  Filled 2023-02-14: qty 10

## 2023-02-14 MED ORDER — FAMOTIDINE IN NACL 20-0.9 MG/50ML-% IV SOLN
20.0000 mg | Freq: Once | INTRAVENOUS | Status: AC
  Administered 2023-02-14: 20 mg via INTRAVENOUS
  Filled 2023-02-14: qty 50

## 2023-02-14 NOTE — Assessment & Plan Note (Addendum)
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, DCIS posterior margin positive, lymphovascular invasion not identified, ER 0%, PR 0%, Ki-67 85%, HER2 0 by IHC Margin excision: Posterior margin: Scattered foci of high-grade DCIS, 0/2 lymph nodes negative, final margins negative  (10/02/2022: Initial biopsy was DCIS ER 30% PR 2%)   Treatment plan: Adjuvant chemotherapy with dose dense Adriamycin and Cytoxan followed by Taxol and carboplatin Adjuvant radiation therapy ------------------------------------------------- Current treatment: Dose dense Adriamycin and Cytoxan started January 01, 2023.  Today is cycle 4 Chemo toxicities: Fatigue Insomnia Anxiety Hospitalization 01/21/2023 for nasal burning, chest discomfort leukocytosis.  Workup was negative, felt to be related to Neulasta and Cytoxan.  We will reduce the dosage of Cytoxan and will slow the infusion rate down.  Encouraged the patient to take Claritin and Tylenol/ibuprofen for the bone pain. Weight gain:  Abdominal pain: Could be acid reflux but also cramps from chemo. Return to clinic in 2 weeks for cycle 1 Taxol

## 2023-02-15 ENCOUNTER — Encounter: Payer: Self-pay | Admitting: Hematology and Oncology

## 2023-02-16 ENCOUNTER — Encounter: Payer: Self-pay | Admitting: Hematology and Oncology

## 2023-02-16 ENCOUNTER — Encounter (HOSPITAL_COMMUNITY): Payer: Self-pay

## 2023-02-16 ENCOUNTER — Ambulatory Visit (INDEPENDENT_AMBULATORY_CARE_PROVIDER_SITE_OTHER): Payer: 59

## 2023-02-16 ENCOUNTER — Telehealth: Payer: Self-pay

## 2023-02-16 ENCOUNTER — Ambulatory Visit (INDEPENDENT_AMBULATORY_CARE_PROVIDER_SITE_OTHER)
Admission: EM | Admit: 2023-02-16 | Discharge: 2023-02-16 | Disposition: A | Discharge status: Home / Self Care | Payer: 59 | Source: Home / Self Care | Attending: Family Medicine | Admitting: Family Medicine

## 2023-02-16 ENCOUNTER — Inpatient Hospital Stay: Payer: 59

## 2023-02-16 DIAGNOSIS — Z1152 Encounter for screening for COVID-19: Secondary | ICD-10-CM | POA: Insufficient documentation

## 2023-02-16 DIAGNOSIS — R519 Headache, unspecified: Secondary | ICD-10-CM | POA: Insufficient documentation

## 2023-02-16 DIAGNOSIS — J069 Acute upper respiratory infection, unspecified: Secondary | ICD-10-CM | POA: Insufficient documentation

## 2023-02-16 DIAGNOSIS — C50411 Malignant neoplasm of upper-outer quadrant of right female breast: Secondary | ICD-10-CM

## 2023-02-16 MED ORDER — PEGFILGRASTIM-JMDB 6 MG/0.6ML ~~LOC~~ SOSY
6.0000 mg | PREFILLED_SYRINGE | Freq: Once | SUBCUTANEOUS | Status: AC
  Administered 2023-02-16: 6 mg via SUBCUTANEOUS
  Filled 2023-02-16: qty 0.6

## 2023-02-16 MED ORDER — BENZONATATE 200 MG PO CAPS: 200.0000 mg | ORAL_CAPSULE | Freq: Three times a day (TID) | ORAL | 0 refills | Status: DC | PRN

## 2023-02-16 NOTE — ED Triage Notes (Signed)
Pt states whole family is has covid. She reports headache,chest congestion that started yesterday. Pt is currently on Chemo.

## 2023-02-16 NOTE — Telephone Encounter (Signed)
S/w pt by phone regarding her visit to urgent care. She states symptoms persisted after she went to urgent care yesterday and she is concerned she has COVID even though her POC was negative yesterday. She had a CXR today at urgent care which was negative for pna. COVID test pending results. She needs to come to [facility] today for her G-CSF injection. She was given instructions to call us upon arrival and stay outside while we retrieve her. She will have her injection at 4 PM. She was advised to start 12 hr mucinex for symptom management per MD. If she has COVID the urgent care provider is sending in Paxlovid. She knows to call with any other concerns.

## 2023-02-16 NOTE — Discharge Instructions (Addendum)
You were seen today for upper respiratory symptoms.  Your covid swab will be resulted tomorrow.  You will see this on mychart.  If positive please call the office for further care.  Your chest xray appears normal to me, but when the radiologist reads these results you will be notified if there is anything abnormal.  I have sent out a medication to help with your cough.  I recommend you get plenty of rest and increase fluids.  If you are having shortness of breath or difficulty breathing then please go to the ER for evaluation.

## 2023-02-16 NOTE — ED Provider Notes (Signed)
[facility]    CSN: [id number] Arrival date & time: 02/16/23  0801      History   Chief Complaint Chief Complaint  Patient presents with   Headache   Cough    HPI Laurie Bell is a 42 y.o. female.   Her grandson tested positive for covid this week.  She started with symptoms yesterday with cough, lots of mucous.  No sob or wheezing.  She is a cancer patient, on chemo, so she has a cough with that as well.  Her whole family has covid.  She did test yesterday which was negative, and started with the cough afterward.  She is worried about pneumonia as well.  She has chest hurting with cough.  No fevers.  No OTC meds used for symptoms.        Past Medical History:  Diagnosis Date   Anxiety    Arthritis    Breast cancer (HCC)    Chlamydia contact, treated    GERD (gastroesophageal reflux disease)    Hyperthyroidism     Patient Active Problem List   Diagnosis Date Noted   Lower extremity pain, anterior, right 01/22/2023   SIRS (systemic inflammatory response syndrome) (HCC) 01/21/2023   Port-A-Cath in place 01/01/2023   Malignant neoplasm of upper-outer quadrant of right breast in female, estrogen receptor negative (HCC) 10/09/2022   Multinodular goiter 03/22/2022   Hyperthyroidism 03/22/2022   MDD (major depressive disorder), recurrent episode, moderate (HCC) 11/03/2021   GAD (generalized anxiety disorder) 11/03/2021   Neck pain 10/28/2021   Cervical high risk HPV (human papillomavirus) test positive 10/17/2021   Irregular heart rhythm 04/15/2021   Numbness on left side 04/15/2021   Dysphagia 03/17/2021   Hypomagnesemia 02/23/2021   Gastroesophageal reflux disease    Hypokalemia 08/24/2020   Atypical chest pain 08/24/2020   Nausea and vomiting 08/24/2020   Hypocalcemia 08/24/2020   DUB (dysfunctional uterine bleeding) 01/12/2017   Fibroid, uterine 01/12/2017    Past Surgical History:  Procedure Laterality Date   AXILLARY SENTINEL NODE  BIOPSY Right 12/01/2022   Procedure: RIGHT AXILLARY SENTINEL NODE BIOPSY;  Surgeon: Abigail Miyamoto, MD;  Location: [facility];  Service: General;  Laterality: Right;   BREAST BIOPSY Right 10/02/2022   MM RT BREAST BX W LOC DEV 1ST LESION IMAGE BX SPEC STEREO GUIDE 10/02/2022 [facility]   BREAST BIOPSY  11/10/2022   MM RT RADIOACTIVE SEED LOC MAMMO GUIDE 11/10/2022 [facility]   BREAST LUMPECTOMY WITH RADIOACTIVE SEED LOCALIZATION Right 11/13/2022   Procedure: RIGHT BREAST LUMPECTOMY WITH RADIOACTIVE SEED LOCALIZATION;  Surgeon: Abigail Miyamoto, MD;  Location: [facility];  Service: General;  Laterality: Right;   ENDOMETRIAL ABLATION N/A 04/25/2017   Procedure: ENDOMETRIAL ABLATION With NOVASURE;  Surgeon: Adam Phenix, MD;  Location: [facility];  Service: Gynecology;  Laterality: N/A;   ESOPHAGEAL MANOMETRY N/A 07/27/2021   Procedure: ESOPHAGEAL MANOMETRY (EM);  Surgeon: Imogene Burn, MD;  Location: [facility];  Service: Gastroenterology;  Laterality: N/A;   PORTACATH PLACEMENT N/A 12/01/2022   Procedure: INSERTION PORT-A-CATH WITH ULTRASOUND GUIDANCE;  Surgeon: Abigail Miyamoto, MD;  Location: [facility];  Service: General;  Laterality: N/A;   RE-EXCISION OF BREAST CANCER,SUPERIOR MARGINS Right 12/01/2022   Procedure: RE-EXCISION OF RIGHT BREAST CANCER;  Surgeon: Abigail Miyamoto, MD;  Location: [facility];  Service: General;  Laterality: Right;   TUBAL LIGATION  2003    OB History     Gravida  2   Para  2  Term  2   Preterm  0   AB  0   Living         SAB  0   IAB  0   Ectopic  0   Multiple      Live Births               Home Medications    Prior to Admission medications   Medication Sig Start Date End Date Taking? Authorizing Provider  acetaminophen (TYLENOL) 500 MG tablet Take 1,000 mg by mouth every 6 (six) hours as needed for moderate pain.   Yes [provider]  albuterol (VENTOLIN HFA) 108 (90 Base) MCG/ACT  inhaler TAKE 2 PUFFS BY MOUTH EVERY 6 HOURS AS NEEDED FOR WHEEZE OR SHORTNESS OF BREATH 12/22/22  Yes Newlin, Enobong, MD  cyclobenzaprine (FLEXERIL) 10 MG tablet TAKE 1 TABLET BY MOUTH AT BEDTIME AS NEEDED FOR MUSCLE SPASMS 10/31/22  Yes Kirsteins, Victorino Sparrow, MD  DULoxetine (CYMBALTA) 60 MG capsule Take 1 capsule (60 mg total) by mouth daily. 11/29/22  Yes Hoy Register, MD  esomeprazole (NEXIUM) 40 MG capsule Take 40 mg by mouth 2 (two) times daily. 02/12/23  Yes [provider]  fluticasone (FLONASE) 50 MCG/ACT nasal spray Place 2 sprays into both nostrils daily. 12/30/21  Yes Lurline Idol, FNP  gabapentin (NEURONTIN) 400 MG capsule Take 400 mg by mouth 3 (three) times daily as needed (pain).   Yes [provider]  hydrocortisone (ANUSOL-HC) 2.5 % rectal cream Place rectally 4 (four) times daily as needed for hemorrhoids or anal itching. 01/23/23  Yes Rolly Salter, MD  hydrOXYzine (ATARAX) 25 MG tablet TAKE 1 TABLET(25 MG) BY MOUTH THREE TIMES DAILY 11/29/22  Yes Newlin, Enobong, MD  ipratropium (ATROVENT) 0.03 % nasal spray Place 2 sprays into both nostrils every 12 (twelve) hours. 12/20/22  Yes Roxy Horseman, PA-C  lidocaine (XYLOCAINE) 2 % solution Use as directed 15 mLs in the mouth or throat every 4 (four) hours as needed for mouth pain. 01/10/23  Yes [provider]  linaclotide Karlene Einstein) 145 MCG CAPS capsule Take 1 capsule (145 mcg total) by mouth daily before breakfast. 05/19/22  Yes Imogene Burn, MD  methimazole (TAPAZOLE) 5 MG tablet Take 1 tablet (5 mg total) by mouth as directed. 1 tablet Monday through Friday, skip Saturdays and  Sundays 09/27/22  Yes Shamleffer, Konrad Dolores, MD  metoprolol succinate (TOPROL-XL) 25 MG 24 hr tablet Take 0.5 tablets (12.5 mg total) by mouth daily. For increased heart rate 11/29/22  Yes Newlin, Enobong, MD  naproxen (NAPROSYN) 500 MG tablet TAKE 1 TABLET BY MOUTH TWICE A DAY AS NEEDED FOR PAIN 01/19/23  Yes Newlin, Enobong, MD   ondansetron (ZOFRAN) 8 MG tablet Take 1 tab (8 mg) by mouth every 8 hrs as needed for nausea/vomiting. Start third day after doxorubicin/cyclophosphamide chemotherapy. 12/12/22  Yes Serena Croissant, MD  potassium chloride (KLOR-CON) 10 MEQ tablet Take 10 mEq by mouth daily. 08/08/22  Yes [provider]  prochlorperazine (COMPAZINE) 10 MG tablet Take 1 tablet (10 mg total) by mouth every 6 (six) hours as needed for nausea or vomiting. 12/12/22  Yes Serena Croissant, MD  zolpidem (AMBIEN) 5 MG tablet Take 1 tablet (5 mg total) by mouth at bedtime as needed for sleep. 01/16/23  Yes Serena Croissant, MD  dexamethasone (DECADRON) 4 MG tablet Take 1 tablet day after chemo and 1 tablet 2 days after chemo with food 12/12/22   Serena Croissant, MD  pantoprazole (PROTONIX)  40 MG tablet Take 1 tablet (40 mg total) by mouth 2 (two) times daily before a meal for 13 days. 01/24/23 02/06/23  Rolly Salter, MD  sucralfate (CARAFATE) 1 g tablet Take 1 tablet (1 g total) by mouth 4 (four) times daily for 14 days. 01/23/23 02/06/23  Rolly Salter, MD  medroxyPROGESTERone (PROVERA) 10 MG tablet Take 2 tablets by mouth daily Patient not taking: Reported on 06/05/2019 02/06/18 02/02/20  Conan Bowens, MD    Family History Family History  Problem Relation Age of Onset   Hypertension Mother    Hypertension Maternal Aunt    Breast cancer Maternal Grandmother        dx > 50   Stomach cancer Maternal Grandfather        dx > 50   Breast cancer Paternal Grandmother        dx > 50   Esophageal cancer Neg Hx    Rectal cancer Neg Hx    Colon cancer Neg Hx     Social History Social History   Tobacco Use   Smoking status: Never   Smokeless tobacco: Never  Vaping Use   Vaping status: Never Used  Substance Use Topics   Alcohol use: No   Drug use: Not Currently    Types: Marijuana    Comment: quit 2018     Allergies   Latex and Tramadol   Review of Systems Review of Systems  Constitutional:  Positive for fatigue.  Negative for fever.  HENT:  Positive for congestion.   Respiratory:  Positive for cough. Negative for shortness of breath.   Gastrointestinal: Negative.   Musculoskeletal: Negative.   Neurological:  Positive for headaches.  Psychiatric/Behavioral: Negative.       Physical Exam Triage Vital Signs ED Triage Vitals [02/16/23 0816]  Encounter Vitals Group     BP (!) 127/91     Systolic BP Percentile      Diastolic BP Percentile      Pulse Rate 92     Resp 16     Temp 98.2 F (36.8 C)     Temp Source Oral     SpO2 98 %     Weight      Height      Head Circumference      Peak Flow      Pain Score      Pain Loc      Pain Education      Exclude from Growth Chart    No data found.  Updated Vital Signs BP (!) 127/91 (BP Location: Left Arm)   Pulse 92   Temp 98.2 F (36.8 C) (Oral)   Resp 16   SpO2 98%   Visual Acuity Right Eye Distance:   Left Eye Distance:   Bilateral Distance:    Right Eye Near:   Left Eye Near:    Bilateral Near:     Physical Exam Constitutional:      Appearance: She is well-developed.  Eyes:     Extraocular Movements: Extraocular movements intact.     Pupils: Pupils are equal, round, and reactive to light.  Cardiovascular:     Rate and Rhythm: Normal rate and regular rhythm.  Pulmonary:     Effort: Pulmonary effort is normal.     Breath sounds: Normal breath sounds.  Musculoskeletal:     Cervical back: Normal range of motion and neck supple.  Lymphadenopathy:     Cervical: No cervical adenopathy.  Neurological:  Mental Status: She is alert.  Psychiatric:        Mood and Affect: Mood normal.      UC Treatments / Results  Labs (all labs ordered are listed, but only abnormal results are displayed) Labs Reviewed  SARS CORONAVIRUS 2 (TAT 6-24 HRS)    EKG   Radiology No results found.  Procedures Procedures (including critical care time)  Medications Ordered in UC Medications - No data to display  Initial Impression  / Assessment and Plan / UC Course  I have reviewed the triage vital signs and the nursing notes.  Pertinent labs & imaging results that were available during my care of the patient were reviewed by me and considered in my medical decision making (see chart for details).  Patient is seen today for URI symptoms.  Her whole family has covid and she is immunocompromised (chemo patient).  She did have a negative test yesterday.  Covid swab is pending today.  If positive would treat with paxlovid.  She will call tomorrow after results are on my chart.  Chest xray done today.  I do not see anything to treat.  However, once radiology reads this if an antibiotic is needed I will call to notify her.  She is aware and agrees with plan.   Final Clinical Impressions(s) / UC Diagnoses   Final diagnoses:  Upper respiratory tract infection, unspecified type  Encounter for screening for COVID-19  Acute nonintractable headache, unspecified headache type     Discharge Instructions      You were seen today for upper respiratory symptoms.  Your covid swab will be resulted tomorrow.  You will see this on mychart.  If positive please call the office for further care.  Your chest xray appears normal to me, but when the radiologist reads these results you will be notified if there is anything abnormal.  I have sent out a medication to help with your cough.  I recommend you get plenty of rest and increase fluids.  If you are having shortness of breath or difficulty breathing then please go to the ER for evaluation.     ED Prescriptions     Medication Sig Dispense Auth. Provider   benzonatate (TESSALON) 200 MG capsule Take 1 capsule (200 mg total) by mouth 3 (three) times daily as needed for cough. 21 capsule Jannifer Franklin, MD      PDMP not reviewed this encounter.   Jannifer Franklin, MD 02/16/23 [phone]

## 2023-02-16 NOTE — Patient Instructions (Signed)

## 2023-02-17 ENCOUNTER — Inpatient Hospital Stay (HOSPITAL_COMMUNITY)
Admission: EM | Admit: 2023-02-17 | Discharge: 2023-02-21 | DRG: 871 | Disposition: A | Discharge status: Home / Self Care | Payer: 59 | Attending: Internal Medicine | Admitting: Internal Medicine

## 2023-02-17 ENCOUNTER — Emergency Department (HOSPITAL_COMMUNITY): Payer: 59

## 2023-02-17 ENCOUNTER — Other Ambulatory Visit: Payer: Self-pay

## 2023-02-17 ENCOUNTER — Encounter (HOSPITAL_COMMUNITY): Payer: Self-pay | Admitting: Emergency Medicine

## 2023-02-17 DIAGNOSIS — E039 Hypothyroidism, unspecified: Secondary | ICD-10-CM | POA: Diagnosis present

## 2023-02-17 DIAGNOSIS — Z8249 Family history of ischemic heart disease and other diseases of the circulatory system: Secondary | ICD-10-CM | POA: Diagnosis not present

## 2023-02-17 DIAGNOSIS — E876 Hypokalemia: Secondary | ICD-10-CM | POA: Diagnosis present

## 2023-02-17 DIAGNOSIS — K219 Gastro-esophageal reflux disease without esophagitis: Secondary | ICD-10-CM | POA: Diagnosis present

## 2023-02-17 DIAGNOSIS — Z9221 Personal history of antineoplastic chemotherapy: Secondary | ICD-10-CM | POA: Diagnosis not present

## 2023-02-17 DIAGNOSIS — C50411 Malignant neoplasm of upper-outer quadrant of right female breast: Secondary | ICD-10-CM

## 2023-02-17 DIAGNOSIS — M79605 Pain in left leg: Secondary | ICD-10-CM | POA: Diagnosis not present

## 2023-02-17 DIAGNOSIS — A4189 Other specified sepsis: Secondary | ICD-10-CM | POA: Diagnosis present

## 2023-02-17 DIAGNOSIS — B370 Candidal stomatitis: Secondary | ICD-10-CM | POA: Diagnosis present

## 2023-02-17 DIAGNOSIS — U071 COVID-19: Secondary | ICD-10-CM | POA: Diagnosis present

## 2023-02-17 DIAGNOSIS — Z885 Allergy status to narcotic agent status: Secondary | ICD-10-CM | POA: Diagnosis not present

## 2023-02-17 DIAGNOSIS — E860 Dehydration: Secondary | ICD-10-CM | POA: Diagnosis present

## 2023-02-17 DIAGNOSIS — D849 Immunodeficiency, unspecified: Secondary | ICD-10-CM | POA: Diagnosis present

## 2023-02-17 DIAGNOSIS — F32A Depression, unspecified: Secondary | ICD-10-CM | POA: Diagnosis present

## 2023-02-17 DIAGNOSIS — Z6834 Body mass index (BMI) 34.0-34.9, adult: Secondary | ICD-10-CM | POA: Diagnosis not present

## 2023-02-17 DIAGNOSIS — A419 Sepsis, unspecified organism: Secondary | ICD-10-CM | POA: Diagnosis present

## 2023-02-17 DIAGNOSIS — E872 Acidosis, unspecified: Secondary | ICD-10-CM | POA: Diagnosis present

## 2023-02-17 DIAGNOSIS — E0590 Thyrotoxicosis, unspecified without thyrotoxic crisis or storm: Secondary | ICD-10-CM | POA: Diagnosis present

## 2023-02-17 DIAGNOSIS — E669 Obesity, unspecified: Secondary | ICD-10-CM | POA: Diagnosis present

## 2023-02-17 DIAGNOSIS — N390 Urinary tract infection, site not specified: Secondary | ICD-10-CM | POA: Diagnosis present

## 2023-02-17 DIAGNOSIS — T451X5A Adverse effect of antineoplastic and immunosuppressive drugs, initial encounter: Secondary | ICD-10-CM | POA: Diagnosis present

## 2023-02-17 DIAGNOSIS — Z803 Family history of malignant neoplasm of breast: Secondary | ICD-10-CM | POA: Diagnosis not present

## 2023-02-17 DIAGNOSIS — Z79899 Other long term (current) drug therapy: Secondary | ICD-10-CM | POA: Diagnosis not present

## 2023-02-17 DIAGNOSIS — Z91040 Latex allergy status: Secondary | ICD-10-CM | POA: Diagnosis not present

## 2023-02-17 DIAGNOSIS — Z171 Estrogen receptor negative status [ER-]: Secondary | ICD-10-CM | POA: Diagnosis not present

## 2023-02-17 DIAGNOSIS — M79662 Pain in left lower leg: Secondary | ICD-10-CM | POA: Diagnosis present

## 2023-02-17 LAB — COMPREHENSIVE METABOLIC PANEL
ALT: 23 U/L (ref 0–44)
AST: 15 U/L (ref 15–41)
Albumin: 3.6 g/dL (ref 3.5–5.0)
Alkaline Phosphatase: 119 U/L (ref 38–126)
Anion gap: 9 (ref 5–15)
BUN: 11 mg/dL (ref 6–20)
CO2: 26 mmol/L (ref 22–32)
Calcium: 8.9 mg/dL (ref 8.9–10.3)
Chloride: 102 mmol/L (ref 98–111)
Creatinine, Ser: 0.72 mg/dL (ref 0.44–1.00)
GFR, Estimated: >60 mL/min (ref >60)
Glucose, Bld: 94 mg/dL (ref 70–99)
Potassium: 3 mmol/L — ABNORMAL LOW (ref 3.5–5.1)
Sodium: 137 mmol/L (ref 135–145)
Total Bilirubin: 0.6 mg/dL (ref 0.3–1.2)
Total Protein: 6.8 g/dL (ref 6.5–8.1)

## 2023-02-17 LAB — LACTIC ACID, PLASMA
Lactic Acid, Venous: 2.3 mmol/L (ref 0.5–1.9)
Lactic Acid, Venous: 3.9 mmol/L (ref 0.5–1.9)

## 2023-02-17 LAB — URINALYSIS, W/ REFLEX TO CULTURE (INFECTION SUSPECTED)
Bilirubin Urine: NEGATIVE
Glucose, UA: NEGATIVE mg/dL
Ketones, ur: NEGATIVE mg/dL
Nitrite: NEGATIVE
Protein, ur: NEGATIVE mg/dL
Specific Gravity, Urine: 1.009 (ref 1.005–1.030)
pH: 7 (ref 5.0–8.0)

## 2023-02-17 LAB — CBC
HCT: 33.6 % — ABNORMAL LOW (ref 36.0–46.0)
Hemoglobin: 11.3 g/dL — ABNORMAL LOW (ref 12.0–15.0)
MCH: 29.5 pg (ref 26.0–34.0)
MCHC: 33.6 g/dL (ref 30.0–36.0)
MCV: 87.7 fL (ref 80.0–100.0)
Platelets: 173 10*3/uL (ref 150–400)
RBC: 3.83 MIL/uL — ABNORMAL LOW (ref 3.87–5.11)
RDW: 16.8 % — ABNORMAL HIGH (ref 11.5–15.5)
WBC: 82.2 10*3/uL (ref 4.0–10.5)
nRBC: 0 % (ref 0.0–0.2)

## 2023-02-17 LAB — MAGNESIUM: Magnesium: 1.9 mg/dL (ref 1.7–2.4)

## 2023-02-17 LAB — SARS CORONAVIRUS 2 (TAT 6-24 HRS): SARS Coronavirus 2: POSITIVE — AB

## 2023-02-17 LAB — TROPONIN I (HIGH SENSITIVITY)
Troponin I (High Sensitivity): 5 ng/L (ref <18)
Troponin I (High Sensitivity): 6 ng/L (ref <18)

## 2023-02-17 LAB — PROCALCITONIN: Procalcitonin: 0.37 ng/mL

## 2023-02-17 LAB — HCG, QUANTITATIVE, PREGNANCY: hCG, Beta Chain, Quant, S: 1 m[IU]/mL (ref <5)

## 2023-02-17 LAB — PHOSPHORUS: Phosphorus: 2.9 mg/dL (ref 2.5–4.6)

## 2023-02-17 LAB — TSH: TSH: 0.566 u[IU]/mL (ref 0.350–4.500)

## 2023-02-17 MED ORDER — KETOROLAC TROMETHAMINE 15 MG/ML IJ SOLN
15.0000 mg | Freq: Once | INTRAMUSCULAR | Status: AC
  Administered 2023-02-17: 15 mg via INTRAVENOUS
  Filled 2023-02-17: qty 1

## 2023-02-17 MED ORDER — HYDROXYZINE HCL 25 MG PO TABS
25.0000 mg | ORAL_TABLET | Freq: Three times a day (TID) | ORAL | Status: DC
  Administered 2023-02-17 – 2023-02-21 (×12): 25 mg via ORAL
  Filled 2023-02-17 (×12): qty 1

## 2023-02-17 MED ORDER — IOHEXOL 350 MG/ML SOLN
75.0000 mL | Freq: Once | INTRAVENOUS | Status: AC | PRN
  Administered 2023-02-17: 75 mL via INTRAVENOUS

## 2023-02-17 MED ORDER — ACETAMINOPHEN 325 MG PO TABS
650.0000 mg | ORAL_TABLET | Freq: Four times a day (QID) | ORAL | Status: DC | PRN
  Administered 2023-02-17 – 2023-02-18 (×2): 650 mg via ORAL
  Filled 2023-02-17 (×2): qty 2

## 2023-02-17 MED ORDER — POTASSIUM CHLORIDE CRYS ER 20 MEQ PO TBCR
40.0000 meq | EXTENDED_RELEASE_TABLET | Freq: Once | ORAL | Status: AC
  Administered 2023-02-17: 40 meq via ORAL
  Filled 2023-02-17: qty 2

## 2023-02-17 MED ORDER — METHIMAZOLE 5 MG PO TABS
5.0000 mg | ORAL_TABLET | ORAL | Status: DC
  Administered 2023-02-19 – 2023-02-21 (×3): 5 mg via ORAL
  Filled 2023-02-17 (×4): qty 1

## 2023-02-17 MED ORDER — ONDANSETRON HCL 4 MG/2ML IJ SOLN
4.0000 mg | Freq: Four times a day (QID) | INTRAMUSCULAR | Status: DC | PRN
  Administered 2023-02-17 – 2023-02-18 (×3): 4 mg via INTRAVENOUS
  Filled 2023-02-17 (×4): qty 2

## 2023-02-17 MED ORDER — ACETAMINOPHEN 650 MG RE SUPP: 650.0000 mg | Freq: Four times a day (QID) | RECTAL | Status: DC | PRN

## 2023-02-17 MED ORDER — METHIMAZOLE 5 MG PO TABS: 5.0000 mg | ORAL_TABLET | ORAL | Status: DC

## 2023-02-17 MED ORDER — METOPROLOL SUCCINATE ER 25 MG PO TB24
12.5000 mg | ORAL_TABLET | Freq: Every day | ORAL | Status: DC
  Administered 2023-02-17 – 2023-02-21 (×5): 12.5 mg via ORAL
  Filled 2023-02-17 (×5): qty 1

## 2023-02-17 MED ORDER — NIRMATRELVIR/RITONAVIR (PAXLOVID)TABLET
3.0000 | ORAL_TABLET | Freq: Two times a day (BID) | ORAL | Status: DC
  Administered 2023-02-17 – 2023-02-21 (×8): 3 via ORAL
  Filled 2023-02-17: qty 30

## 2023-02-17 MED ORDER — CYCLOBENZAPRINE HCL 10 MG PO TABS
10.0000 mg | ORAL_TABLET | Freq: Every day | ORAL | Status: DC
  Administered 2023-02-17 – 2023-02-20 (×4): 10 mg via ORAL
  Filled 2023-02-17 (×4): qty 1

## 2023-02-17 MED ORDER — MAGNESIUM SULFATE 2 GM/50ML IV SOLN
2.0000 g | Freq: Once | INTRAVENOUS | Status: AC
  Administered 2023-02-17: 2 g via INTRAVENOUS
  Filled 2023-02-17: qty 50

## 2023-02-17 MED ORDER — ENOXAPARIN SODIUM 40 MG/0.4ML IJ SOSY
40.0000 mg | PREFILLED_SYRINGE | INTRAMUSCULAR | Status: DC
  Administered 2023-02-17 – 2023-02-20 (×4): 40 mg via SUBCUTANEOUS
  Filled 2023-02-17 (×4): qty 0.4

## 2023-02-17 MED ORDER — LACTATED RINGERS IV SOLN: INTRAVENOUS | Status: AC

## 2023-02-17 MED ORDER — ZOLPIDEM TARTRATE 5 MG PO TABS
5.0000 mg | ORAL_TABLET | Freq: Every evening | ORAL | Status: DC | PRN
  Administered 2023-02-17 – 2023-02-21 (×4): 5 mg via ORAL
  Filled 2023-02-17 (×4): qty 1

## 2023-02-17 MED ORDER — SODIUM CHLORIDE 0.9 % IV SOLN
500.0000 mg | INTRAVENOUS | Status: DC
  Administered 2023-02-17 – 2023-02-18 (×2): 500 mg via INTRAVENOUS
  Filled 2023-02-17 (×2): qty 5

## 2023-02-17 MED ORDER — PANTOPRAZOLE SODIUM 40 MG PO TBEC
40.0000 mg | DELAYED_RELEASE_TABLET | Freq: Every day | ORAL | Status: DC
  Administered 2023-02-17 – 2023-02-21 (×5): 40 mg via ORAL
  Filled 2023-02-17 (×5): qty 1

## 2023-02-17 MED ORDER — BENZONATATE 100 MG PO CAPS
200.0000 mg | ORAL_CAPSULE | Freq: Three times a day (TID) | ORAL | Status: DC | PRN
  Filled 2023-02-17: qty 2

## 2023-02-17 MED ORDER — SODIUM CHLORIDE 0.9 % IV SOLN
2.0000 g | INTRAVENOUS | Status: AC
  Administered 2023-02-17 – 2023-02-21 (×5): 2 g via INTRAVENOUS
  Filled 2023-02-17 (×5): qty 20

## 2023-02-17 MED ORDER — SODIUM CHLORIDE 0.9 % IV BOLUS
1000.0000 mL | Freq: Once | INTRAVENOUS | Status: AC
  Administered 2023-02-17: 1000 mL via INTRAVENOUS

## 2023-02-17 MED ORDER — SODIUM CHLORIDE 0.9 % IV BOLUS: 1000.0000 mL | Freq: Once | INTRAVENOUS | Status: DC

## 2023-02-17 NOTE — H&P (Signed)
History and Physical    PatientMarland Kitchen Laurie Bell DOB: Feb 20, 1981 DOA: 02/17/2023 DOS: the patient was seen and examined on 02/17/2023 PCP: Hoy Register, MD  Patient coming from: Home  Chief Complaint:  Chief Complaint  Patient presents with   Cough   Shortness of Breath   Headache   Generalized Body Aches   HPI: Laurie Bell is a 42 y.o. female with medical history significant of anxiety, depression, multinodular goiter hypomagnesemia, hypokalemia, hypocalcemia, hypothyroidism, DU B, fibroids, dysphagia, breast cancer who is presented to the emergency department complaints of dyspnea, cough, headache, body aches, fatigue and malaise for the past 2 days.  She has been exposed to relatives with COVID-19.  Her last chemotherapy treatment was 3 days ago. He denied fever, chills, rhinorrhea, sore throat, wheezing or hemoptysis.  Positive pleuritic chest pain but no palpitations, diaphoresis, PND, orthopnea or pitting edema of the lower extremities.  Positive abdominal muscle pain from coughing and nausea, but no emesis, melena or hematochezia.  She occasionally gets constipated and gets diarrhea 2 or 3 days after chemotherapy.  No flank pain, dysuria, frequency or hematuria.  No polyuria, polydipsia, polyphagia or blurred vision.   Lab work: Her urinalysis was hazy with small hemoglobin and large leukocyte esterase.  11-20 RBC, 21-50 WBC and rare bacteria on microscopic examination.  Serum pregnancy test was negative.  CBC showed a white count of 82.2, hemoglobin 11.3 g/dL and platelets 811.  Normal troponin and TSH.  Lactic acid 2.3 mmol/L.  CMP was normal except for potassium of 3.0 mmol/L.  Imaging: 2 view chest radiograph done yesterday with no acute cardiopulmonary process.  CTA chest with no pulmonary embolism.  Unchanged 4 mm right middle lobe nodule.  No acute abnormality seen.  No pleural effusion or pneumothorax.   ED course: Initial vital signs were temperature 98.3  F, pulse 110, respirations 20, BP 117/92 mmHg O2 sat 100% on room air.  The patient received ceftriaxone 2 g IVPB, azithromycin 500 mg IVPB, ketorolac 15 mg IVP, KCl 40 mEq p.o. x 1 and was started on LR at 150 mL/h.  Review of Systems: As mentioned in the history of present illness. All other systems reviewed and are negative. Past Medical History:  Diagnosis Date   Anxiety    Arthritis    Breast cancer (HCC)    Chlamydia contact, treated    GERD (gastroesophageal reflux disease)    Hyperthyroidism    Past Surgical History:  Procedure Laterality Date   AXILLARY SENTINEL NODE BIOPSY Right 12/01/2022   Procedure: RIGHT AXILLARY SENTINEL NODE BIOPSY;  Surgeon: Abigail Miyamoto, MD;  Location: [facility] OR;  Service: General;  Laterality: Right;   BREAST BIOPSY Right 10/02/2022   MM RT BREAST BX W LOC DEV 1ST LESION IMAGE BX SPEC STEREO GUIDE 10/02/2022 [facility]   BREAST BIOPSY  11/10/2022   MM RT RADIOACTIVE SEED LOC MAMMO GUIDE 11/10/2022 [facility]   BREAST LUMPECTOMY WITH RADIOACTIVE SEED LOCALIZATION Right 11/13/2022   Procedure: RIGHT BREAST LUMPECTOMY WITH RADIOACTIVE SEED LOCALIZATION;  Surgeon: Abigail Miyamoto, MD;  Location: [facility];  Service: General;  Laterality: Right;   ENDOMETRIAL ABLATION N/A 04/25/2017   Procedure: ENDOMETRIAL ABLATION With NOVASURE;  Surgeon: Adam Phenix, MD;  Location: [facility];  Service: Gynecology;  Laterality: N/A;   ESOPHAGEAL MANOMETRY N/A 07/27/2021   Procedure: ESOPHAGEAL MANOMETRY (EM);  Surgeon: Imogene Burn, MD;  Location: [facility];  Service: Gastroenterology;  Laterality: N/A;   PORTACATH  PLACEMENT N/A 12/01/2022   Procedure: INSERTION PORT-A-CATH WITH ULTRASOUND GUIDANCE;  Surgeon: Abigail Miyamoto, MD;  Location: [facility];  Service: General;  Laterality: N/A;   RE-EXCISION OF BREAST CANCER,SUPERIOR MARGINS Right 12/01/2022   Procedure: RE-EXCISION OF RIGHT BREAST CANCER;  Surgeon: Abigail Miyamoto, MD;  Location: [facility];  Service: General;  Laterality: Right;   TUBAL LIGATION  2003   Social History:  reports that she has never smoked. She has never used smokeless tobacco. She reports that she does not currently use drugs after having used the following drugs: Marijuana. She reports that she does not drink alcohol.  Allergies  Allergen Reactions   Latex Other (See Comments)    Burn in vaginal area with latex condoms   Tramadol Other (See Comments)    States "it messes me up" no further info    Family History  Problem Relation Age of Onset   Hypertension Mother    Hypertension Maternal Aunt    Breast cancer Maternal Grandmother        dx > 50   Stomach cancer Maternal Grandfather        dx > 50   Breast cancer Paternal Grandmother        dx > 50   Esophageal cancer Neg Hx    Rectal cancer Neg Hx    Colon cancer Neg Hx     Prior to Admission medications   Medication Sig Start Date End Date Taking? Authorizing Provider  acetaminophen (TYLENOL) 500 MG tablet Take 1,000 mg by mouth every 6 (six) hours as needed for moderate pain.    [provider]  albuterol (VENTOLIN HFA) 108 (90 Base) MCG/ACT inhaler TAKE 2 PUFFS BY MOUTH EVERY 6 HOURS AS NEEDED FOR WHEEZE OR SHORTNESS OF BREATH 12/22/22   Hoy Register, MD  benzonatate (TESSALON) 200 MG capsule Take 1 capsule (200 mg total) by mouth 3 (three) times daily as needed for cough. 02/16/23   Piontek, Denny Peon, MD  cyclobenzaprine (FLEXERIL) 10 MG tablet TAKE 1 TABLET BY MOUTH AT BEDTIME AS NEEDED FOR MUSCLE SPASMS 10/31/22   Kirsteins, Victorino Sparrow, MD  dexamethasone (DECADRON) 4 MG tablet Take 1 tablet day after chemo and 1 tablet 2 days after chemo with food 12/12/22   Serena Croissant, MD  DULoxetine (CYMBALTA) 60 MG capsule Take 1 capsule (60 mg total) by mouth daily. 11/29/22   Hoy Register, MD  esomeprazole (NEXIUM) 40 MG capsule Take 40 mg by mouth 2 (two) times daily. 02/12/23   [provider]  fluticasone  (FLONASE) 50 MCG/ACT nasal spray Place 2 sprays into both nostrils daily. 12/30/21   Lurline Idol, FNP  gabapentin (NEURONTIN) 400 MG capsule Take 400 mg by mouth 3 (three) times daily as needed (pain).    [provider]  hydrocortisone (ANUSOL-HC) 2.5 % rectal cream Place rectally 4 (four) times daily as needed for hemorrhoids or anal itching. 01/23/23   Rolly Salter, MD  hydrOXYzine (ATARAX) 25 MG tablet TAKE 1 TABLET(25 MG) BY MOUTH THREE TIMES DAILY 11/29/22   Hoy Register, MD  ipratropium (ATROVENT) 0.03 % nasal spray Place 2 sprays into both nostrils every 12 (twelve) hours. 12/20/22   Roxy Horseman, PA-C  lidocaine (XYLOCAINE) 2 % solution Use as directed 15 mLs in the mouth or throat every 4 (four) hours as needed for mouth pain. 01/10/23   [provider]  linaclotide Karlene Einstein) 145 MCG CAPS capsule Take 1 capsule (145 mcg total) by mouth daily before breakfast. 05/19/22  Imogene Burn, MD  methimazole (TAPAZOLE) 5 MG tablet Take 1 tablet (5 mg total) by mouth as directed. 1 tablet Monday through Friday, skip Saturdays and  Sundays 09/27/22   Shamleffer, Konrad Dolores, MD  metoprolol succinate (TOPROL-XL) 25 MG 24 hr tablet Take 0.5 tablets (12.5 mg total) by mouth daily. For increased heart rate 11/29/22   Newlin, Odette Horns, MD  naproxen (NAPROSYN) 500 MG tablet TAKE 1 TABLET BY MOUTH TWICE A DAY AS NEEDED FOR PAIN 01/19/23   Hoy Register, MD  ondansetron (ZOFRAN) 8 MG tablet Take 1 tab (8 mg) by mouth every 8 hrs as needed for nausea/vomiting. Start third day after doxorubicin/cyclophosphamide chemotherapy. 12/12/22   Serena Croissant, MD  pantoprazole (PROTONIX) 40 MG tablet Take 1 tablet (40 mg total) by mouth 2 (two) times daily before a meal for 13 days. 01/24/23 02/06/23  Rolly Salter, MD  potassium chloride (KLOR-CON) 10 MEQ tablet Take 10 mEq by mouth daily. 08/08/22   [provider]  prochlorperazine (COMPAZINE) 10 MG tablet Take 1 tablet (10 mg total)  by mouth every 6 (six) hours as needed for nausea or vomiting. 12/12/22   Serena Croissant, MD  sucralfate (CARAFATE) 1 g tablet Take 1 tablet (1 g total) by mouth 4 (four) times daily for 14 days. 01/23/23 02/06/23  Rolly Salter, MD  zolpidem (AMBIEN) 5 MG tablet Take 1 tablet (5 mg total) by mouth at bedtime as needed for sleep. 01/16/23   Serena Croissant, MD  medroxyPROGESTERone (PROVERA) 10 MG tablet Take 2 tablets by mouth daily Patient not taking: Reported on 06/05/2019 02/06/18 02/02/20  Conan Bowens, MD    Physical Exam: Vitals:   02/17/23 1200 02/17/23 1230 02/17/23 1300 02/17/23 1430  BP: 127/89 (!) 119/90 112/78 120/79  Pulse: (!) 111 (!) 111 (!) 109 (!) 118  Resp: 17 19 19 19   Temp:    98.4 F (36.9 C)  TempSrc:    Oral  SpO2: 100% 100% 100% 100%  Weight:      Height:       Physical Exam Vitals and nursing note reviewed.  Constitutional:      General: She is awake. She is not in acute distress.    Appearance: She is obese. She is ill-appearing.  HENT:     Head: Normocephalic.     Nose: No rhinorrhea.     Mouth/Throat:     Mouth: Mucous membranes are moist.  Eyes:     General: No scleral icterus.    Extraocular Movements:     Left eye: No nystagmus.     Pupils: Pupils are equal, round, and reactive to light.  Neck:     Vascular: No JVD.  Cardiovascular:     Rate and Rhythm: Regular rhythm. Tachycardia present.     Heart sounds: S1 normal and S2 normal.  Pulmonary:     Effort: Pulmonary effort is normal.     Breath sounds: Normal breath sounds. No wheezing, rhonchi or rales.  Abdominal:     General: Bowel sounds are normal. There is no distension.     Palpations: Abdomen is soft.     Tenderness: There is no abdominal tenderness. There is no guarding or rebound.  Musculoskeletal:     Cervical back: Neck supple.     Right lower leg: No edema.     Left lower leg: No edema.  Skin:    General: Skin is warm and dry.  Neurological:     General: No focal deficit  present.     Mental Status: She is alert and oriented to person, place, and time.  Psychiatric:        Mood and Affect: Mood normal.        Behavior: Behavior normal. Behavior is cooperative.     Data Reviewed:  Results are pending, will review when available.  EKG: Vent. rate 107 BPM PR interval 156 ms QRS duration 64 ms QT/QTcB 353/471 ms P-R-T axes 42 34 32 Sinus tachycardia Anterior infarct, old  Assessment and Plan: Principal Problem:   Sepsis (HCC) In the setting of:   COVID-19 virus infection Admit to PCU/inpatient. Supplemental oxygen as needed. Bronchodilators as needed. Incentive spirometry while awake. Flutter valve exercises. Antitussives as needed. Paxlovid per pharmacy. Follow-up CBC, CMP and chemistry. Second lactic acid level still not done. The patient stated she is feeling much better. Continue IV azithromycin and ceftriaxone. Check procalcitonin level today and tomorrow. Follow blood cultures and sensitivity.  Active Problems:   Hypokalemia Replacing. Magnesium has been supplemented. Follow-up potassium level in the morning.    Gastroesophageal reflux disease Continue pantoprazole 40 mg p.o. daily.    Hyperthyroidism Continue methimazole 5 mg p.o. Monday through Friday.    Malignant neoplasm of upper-outer quadrant of  right breast in female, estrogen receptor negative (HCC) Received chemotherapy earlier this week. Follow-up with oncology as scheduled.    Advance Care Planning:   Code Status: Full Code   Consults:   Family Communication:   Severity of Illness: The appropriate patient status for this patient is INPATIENT. Inpatient status is judged to be reasonable and necessary in order to provide the required intensity of service to ensure the patient's safety. The patient's presenting symptoms, physical exam findings, and initial radiographic and laboratory data in the context of their chronic comorbidities is felt to place them at  high risk for further clinical deterioration. Furthermore, it is not anticipated that the patient will be medically stable for discharge from the hospital within 2 midnights of admission.   * I certify that at the point of admission it is my clinical judgment that the patient will require inpatient hospital care spanning beyond 2 midnights from the point of admission due to high intensity of service, high risk for further deterioration and high frequency of surveillance required.*  Author: Bobette Mo, MD 02/17/2023 3:12 PM  For on call review [email or web address].   This document was prepared using Dragon voice recognition software and may contain some unintended transcription errors.

## 2023-02-17 NOTE — ED Triage Notes (Signed)
Pt to ER via EMS form home with c/o SHOB, cough productive of white sputum, body aches and headache.  Pt states positive COVID test this AM.  Pt currently taking Chemo for breast CA.  Pt c/o chest pain with cough.  Denies known fever.

## 2023-02-17 NOTE — Sepsis Progress Note (Signed)
Sepsis protocol is being followed by [facility]. 

## 2023-02-17 NOTE — ED Notes (Signed)
Date and time results received: 02/17/23 12:15 PM  (use smartphrase ".now" to insert current time)  Test: lactic acid 2.3 WBC 82  Name of Provider Notified: Dr. Earlene Plater  Orders Received? Or Actions Taken?: see chart

## 2023-02-17 NOTE — Sepsis Progress Note (Signed)
Notified provider of need to consider ordering a repeat lactic acid since the first lactic was >2.

## 2023-02-17 NOTE — Sepsis Progress Note (Signed)
Notified bedside nurse of need to draw repeat lactic acid per sepsis protocol. 
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
## 2023-02-17 NOTE — ED Provider Notes (Signed)
[place] EMERGENCY DEPARTMENT AT [facility] Provider Note   CSN: [id number] Arrival date & time: 02/17/23  1029     History  Chief Complaint  Patient presents with   Cough   Shortness of Breath   Headache   Generalized Body Aches    Laurie Bell is a 42 y.o. female with PMHx breast cancer, GERD, hyperthyroidism who presents to ED concerned for SOB, productive cough with white sputum, body aches, and headache. Also with chest pain with cough. Positive COVID test resulted this morning. Patient stating that she had a round of chemo yesterday.   Denies fever, vomiting, diarrhea, dysuria, hematuria. Denies pain/swelling of legs.     Cough Associated symptoms: headaches and shortness of breath   Shortness of Breath Associated symptoms: cough and headaches   Headache Associated symptoms: cough        Home Medications Prior to Admission medications   Medication Sig Start Date End Date Taking? Authorizing Provider  acetaminophen (TYLENOL) 500 MG tablet Take 1,000 mg by mouth every 6 (six) hours as needed for moderate pain.    [provider]  albuterol (VENTOLIN HFA) 108 (90 Base) MCG/ACT inhaler TAKE 2 PUFFS BY MOUTH EVERY 6 HOURS AS NEEDED FOR WHEEZE OR SHORTNESS OF BREATH 12/22/22   Hoy Register, MD  benzonatate (TESSALON) 200 MG capsule Take 1 capsule (200 mg total) by mouth 3 (three) times daily as needed for cough. 02/16/23   Piontek, Denny Peon, MD  cyclobenzaprine (FLEXERIL) 10 MG tablet TAKE 1 TABLET BY MOUTH AT BEDTIME AS NEEDED FOR MUSCLE SPASMS 10/31/22   Kirsteins, Victorino Sparrow, MD  dexamethasone (DECADRON) 4 MG tablet Take 1 tablet day after chemo and 1 tablet 2 days after chemo with food 12/12/22   Serena Croissant, MD  DULoxetine (CYMBALTA) 60 MG capsule Take 1 capsule (60 mg total) by mouth daily. 11/29/22   Hoy Register, MD  esomeprazole (NEXIUM) 40 MG capsule Take 40 mg by mouth 2 (two) times daily. 02/12/23   [provider]  fluticasone  (FLONASE) 50 MCG/ACT nasal spray Place 2 sprays into both nostrils daily. 12/30/21   Lurline Idol, FNP  gabapentin (NEURONTIN) 400 MG capsule Take 400 mg by mouth 3 (three) times daily as needed (pain).    [provider]  hydrocortisone (ANUSOL-HC) 2.5 % rectal cream Place rectally 4 (four) times daily as needed for hemorrhoids or anal itching. 01/23/23   Rolly Salter, MD  hydrOXYzine (ATARAX) 25 MG tablet TAKE 1 TABLET(25 MG) BY MOUTH THREE TIMES DAILY 11/29/22   Hoy Register, MD  ipratropium (ATROVENT) 0.03 % nasal spray Place 2 sprays into both nostrils every 12 (twelve) hours. 12/20/22   Roxy Horseman, PA-C  lidocaine (XYLOCAINE) 2 % solution Use as directed 15 mLs in the mouth or throat every 4 (four) hours as needed for mouth pain. 01/10/23   [provider]  linaclotide Karlene Einstein) 145 MCG CAPS capsule Take 1 capsule (145 mcg total) by mouth daily before breakfast. 05/19/22   Imogene Burn, MD  methimazole (TAPAZOLE) 5 MG tablet Take 1 tablet (5 mg total) by mouth as directed. 1 tablet Monday through Friday, skip Saturdays and  Sundays 09/27/22   Shamleffer, Konrad Dolores, MD  metoprolol succinate (TOPROL-XL) 25 MG 24 hr tablet Take 0.5 tablets (12.5 mg total) by mouth daily. For increased heart rate 11/29/22   Hoy Register, MD  naproxen (NAPROSYN) 500 MG tablet TAKE 1 TABLET BY MOUTH TWICE A DAY AS NEEDED FOR PAIN 01/19/23  Hoy Register, MD  ondansetron (ZOFRAN) 8 MG tablet Take 1 tab (8 mg) by mouth every 8 hrs as needed for nausea/vomiting. Start third day after doxorubicin/cyclophosphamide chemotherapy. 12/12/22   Serena Croissant, MD  pantoprazole (PROTONIX) 40 MG tablet Take 1 tablet (40 mg total) by mouth 2 (two) times daily before a meal for 13 days. 01/24/23 02/06/23  Rolly Salter, MD  potassium chloride (KLOR-CON) 10 MEQ tablet Take 10 mEq by mouth daily. 08/08/22   [provider]  prochlorperazine (COMPAZINE) 10 MG tablet Take 1 tablet (10 mg total)  by mouth every 6 (six) hours as needed for nausea or vomiting. 12/12/22   Serena Croissant, MD  sucralfate (CARAFATE) 1 g tablet Take 1 tablet (1 g total) by mouth 4 (four) times daily for 14 days. 01/23/23 02/06/23  Rolly Salter, MD  zolpidem (AMBIEN) 5 MG tablet Take 1 tablet (5 mg total) by mouth at bedtime as needed for sleep. 01/16/23   Serena Croissant, MD  medroxyPROGESTERone (PROVERA) 10 MG tablet Take 2 tablets by mouth daily Patient not taking: Reported on 06/05/2019 02/06/18 02/02/20  Conan Bowens, MD      Allergies    Latex and Tramadol    Review of Systems   Review of Systems  Respiratory:  Positive for cough and shortness of breath.   Neurological:  Positive for headaches.    Physical Exam Updated Vital Signs BP 120/79   Pulse (!) 118   Temp 98.4 F (36.9 C) (Oral)   Resp 19   Ht 5\' 6"  (1.676 m)   Wt 95.7 kg   SpO2 100%   BMI 34.06 kg/m  Physical Exam Vitals and nursing note reviewed.  Constitutional:      General: She is not in acute distress.    Appearance: She is not ill-appearing or toxic-appearing.  HENT:     Head: Normocephalic and atraumatic.     Mouth/Throat:     Mouth: Mucous membranes are moist.     Pharynx: No posterior oropharyngeal erythema.  Eyes:     General: No scleral icterus.       Right eye: No discharge.        Left eye: No discharge.     Conjunctiva/sclera: Conjunctivae normal.  Cardiovascular:     Rate and Rhythm: Normal rate and regular rhythm.     Pulses: Normal pulses.     Heart sounds: No murmur heard. Pulmonary:     Effort: Pulmonary effort is normal. No tachypnea or respiratory distress.     Breath sounds: Normal breath sounds. No wheezing, rhonchi or rales.  Abdominal:     General: Bowel sounds are normal.     Palpations: Abdomen is soft.  Musculoskeletal:     Right lower leg: No edema.     Left lower leg: No edema.  Skin:    General: Skin is warm and dry.     Findings: No rash.  Neurological:     General: No focal deficit  present.     Mental Status: She is alert. Mental status is at baseline.  Psychiatric:        Mood and Affect: Mood normal.        Behavior: Behavior normal.     ED Results / Procedures / Treatments   Labs (all labs ordered are listed, but only abnormal results are displayed) Labs Reviewed  COMPREHENSIVE METABOLIC PANEL - Abnormal; Notable for the following components:      Result Value   Potassium 3.0 (*)  All other components within normal limits  CBC - Abnormal; Notable for the following components:   WBC 82.2 (*)    RBC 3.83 (*)    Hemoglobin 11.3 (*)    HCT 33.6 (*)    RDW 16.8 (*)    All other components within normal limits  LACTIC ACID, PLASMA - Abnormal; Notable for the following components:   Lactic Acid, Venous 2.3 (*)    All other components within normal limits  CULTURE, BLOOD (ROUTINE X 2)  CULTURE, BLOOD (ROUTINE X 2)  HCG, QUANTITATIVE, PREGNANCY  TSH  URINALYSIS, W/ REFLEX TO CULTURE (INFECTION SUSPECTED)  TROPONIN I (HIGH SENSITIVITY)  TROPONIN I (HIGH SENSITIVITY)    EKG EKG Interpretation Date/Time:  Saturday February 17 2023 10:56:57 EDT Ventricular Rate:  107 PR Interval:  156 QRS Duration:  64 QT Interval:  353 QTC Calculation: 471 R Axis:   34  Text Interpretation: Sinus tachycardia Anterior infarct, old Confirmed by Fulton Reek [phone]) on 02/17/2023 2:42:42 PM  Radiology CT Angio Chest PE W/Cm &/Or Wo Cm  Result Date: 02/17/2023 CLINICAL DATA:  Shortness of breath. History of breast cancer. Productive cough. EXAM: CT ANGIOGRAPHY CHEST WITH CONTRAST TECHNIQUE: Multidetector CT imaging of the chest was performed using the standard protocol during bolus administration of intravenous contrast. Multiplanar CT image reconstructions and MIPs were obtained to evaluate the vascular anatomy. RADIATION DOSE REDUCTION: This exam was performed according to the departmental dose-optimization program which includes automated exposure control, adjustment  of the mA and/or kV according to patient size and/or use of iterative reconstruction technique. CONTRAST:  75mL OMNIPAQUE IOHEXOL 350 MG/ML SOLN COMPARISON:  January 21, 2023.  July 24, 2015. FINDINGS: Cardiovascular: Satisfactory opacification of the pulmonary arteries to the segmental level. No evidence of pulmonary embolism. Normal heart size. No pericardial effusion. Mediastinum/Nodes: No enlarged mediastinal, hilar, or axillary lymph nodes. Thyroid gland, trachea, and esophagus demonstrate no significant findings. Lungs/Pleura: 4 mm right middle lobe nodule is noted on image number 66 of series 12 which is unchanged compared to prior exam of 2017. No acute abnormality seen. No pleural effusion or pneumothorax. Upper Abdomen: No acute abnormality. Musculoskeletal: No chest wall abnormality. No acute or significant osseous findings. Review of the MIP images confirms the above findings. IMPRESSION: No definite evidence of pulmonary embolus. Electronically Signed   By: Lupita Raider M.D.   On: 02/17/2023 13:52   DG Chest 2 View  Result Date: 02/16/2023 CLINICAL DATA:  cough, immunocompromised EXAM: CHEST - 2 VIEW COMPARISON:  01/21/2023 FINDINGS: Cardiomediastinal silhouette and pulmonary vasculature are within normal limits. Left chest port is unchanged in position. Right breast clips are again noted. No airspace opacity to indicate pneumonia.  Lungs are clear. IMPRESSION: No acute cardiopulmonary process. Electronically Signed   By: Acquanetta Belling M.D.   On: 02/16/2023 10:09    Procedures Procedures    Medications Ordered in ED Medications  ondansetron (ZOFRAN) injection 4 mg (4 mg Intravenous Given 02/17/23 1159)  lactated ringers infusion ( Intravenous New Bag/Given 02/17/23 1345)  cefTRIAXone (ROCEPHIN) 2 g in sodium chloride 0.9 % 100 mL IVPB (2 g Intravenous New Bag/Given 02/17/23 1345)  azithromycin (ZITHROMAX) 500 mg in sodium chloride 0.9 % 250 mL IVPB (500 mg Intravenous New Bag/Given 02/17/23  1345)  potassium chloride SA (KLOR-CON M) CR tablet 40 mEq (has no administration in time range)  ketorolac (TORADOL) 15 MG/ML injection 15 mg (has no administration in time range)  sodium chloride 0.9 % bolus 1,000 mL (0 mLs  Intravenous Stopped 02/17/23 1328)  iohexol (OMNIPAQUE) 350 MG/ML injection 75 mL (75 mLs Intravenous Contrast Given 02/17/23 1232)    ED Course/ Medical Decision Making/ A&P                                 Medical Decision Making Amount and/or Complexity of Data Reviewed Labs: ordered. Radiology: ordered.  Risk Prescription drug management. Decision regarding hospitalization.   This patient presents to the ED for concern of shortness of breath, this involves an extensive number of treatment options, and is a complaint that carries with it a high risk of complications and morbidity.  The differential diagnosis includes Anxiety, Anaphylaxis/Angioedema, Aspirated FB, Arrhythmia, CHF, Asthma, COPD, PNA, COVID/Flu/RSV, STEMI, Tamponade, TPNX, DKA, Sepsis, Toxin   Co morbidities that complicate the patient evaluation  breast cancer, GERD, hyperthyroidism    Lab Tests:  I Ordered, and personally interpreted labs.  The pertinent results include:   - CMP: hypokalemia at 3.0; otherwise no concern for electrolyte abnormality; no concern for kidney/liver damage - Trop: within normal limits - CBC: leukocytosis at 82.2; mild anemia at 11.3 - blood cultures: pending - LA: 2.3 - hcg: negative - TSH: pending - UA: pending    Imaging Studies ordered:  I ordered imaging studies including  -CTA: to assess for PNA and PE  I independently visualized and interpreted imaging I agree with the radiologist interpretation   Cardiac Monitoring: / EKG:  The patient was maintained on a cardiac monitor.  I personally viewed and interpreted the cardiac monitored which showed an underlying rhythm of: sinus tachycardia without ST changes or arrhythmias   Consultations  Obtained:  I requested consultation with Oncologist Dr. Arbutus Ped,  and discussed lab and imaging findings as well as pertinent plan - they recommend: inpatient admission if patient's tachycardia dose not resolve with IV fluids   Problem List / ED Course / Critical interventions / Medication management  Admitting patient for COVID and sepsis Patient presents with SOB, productive cough, body ache, and headache. Patient COVID test resulted positive this morning. Vitals with tachycardia that did not resolve after 1L IV fluids. Rest of physical exam unremarkable. CBC with leukocytosis at 82.2 - patient is on chemo injections that raise WBC. Patient just received this dose yesterday. I believe this is the cause for her leukocytosis, however, given that she now technically meets Sepsis criteria, I have activated code sepsis and started patient on IV ABX for PNA given her cough. LA 2.3. hcg negative. CMP with mild hypokalemia at 3.0 - I have provided patient with oral potassium in ED. Initial and repeat troponin within normal limits. EKG reassuring. Hcg negative. TSH pending. Blood cultures pending. UA pending. CTA without concern for PE or PNA. I have reviewed the patients home medicines and have made adjustments as needed Dr. Robb Matar admitting provider   DDx: These are considered less likely due to history of present illness and physical exam findings -Aspirated FB: no history of choking -Arrhythmia/STEMI: EKG and troponin reassuring -Asthma/COPD/PNA/COVID/Flu/RSV: Lungs clear to auscultation bilaterally and O2 sat 100% -Tamponade: CT without concern -CHF: no physical exam findings -TPNX: Lungs clear to auscultation bilaterally -Sepsis: afebrile and other vital signs stable   Social Determinants of Health:  none          Final Clinical Impression(s) / ED Diagnoses Final diagnoses:  COVID  Sepsis without acute organ dysfunction, due to unspecified organism [facility])    Rx /  DC Orders ED  Discharge Orders     None         Dorthy Cooler, [place] 02/17/23 1503    Laurence Spates, MD 02/18/23 [phone]

## 2023-02-17 NOTE — Plan of Care (Signed)
  Problem: Education: Goal: Knowledge of risk factors and measures for prevention of condition will improve Outcome: Progressing   

## 2023-02-18 DIAGNOSIS — A419 Sepsis, unspecified organism: Secondary | ICD-10-CM

## 2023-02-18 LAB — CBC
HCT: 33.4 % — ABNORMAL LOW (ref 36.0–46.0)
Hemoglobin: 11.1 g/dL — ABNORMAL LOW (ref 12.0–15.0)
MCH: 28.8 pg (ref 26.0–34.0)
MCHC: 33.2 g/dL (ref 30.0–36.0)
MCV: 86.8 fL (ref 80.0–100.0)
Platelets: 168 10*3/uL (ref 150–400)
RBC: 3.85 MIL/uL — ABNORMAL LOW (ref 3.87–5.11)
RDW: 16.9 % — ABNORMAL HIGH (ref 11.5–15.5)
WBC: 66.8 10*3/uL (ref 4.0–10.5)
nRBC: 0 % (ref 0.0–0.2)

## 2023-02-18 LAB — C-REACTIVE PROTEIN: CRP: 6.8 mg/dL — ABNORMAL HIGH (ref <1.0)

## 2023-02-18 LAB — LACTIC ACID, PLASMA
Lactic Acid, Venous: 3.3 mmol/L (ref 0.5–1.9)
Lactic Acid, Venous: 1.5 mmol/L (ref 0.5–1.9)

## 2023-02-18 LAB — COMPREHENSIVE METABOLIC PANEL
ALT: 19 U/L (ref 0–44)
AST: 14 U/L — ABNORMAL LOW (ref 15–41)
Albumin: 3.6 g/dL (ref 3.5–5.0)
Alkaline Phosphatase: 134 U/L — ABNORMAL HIGH (ref 38–126)
Anion gap: 11 (ref 5–15)
BUN: 9 mg/dL (ref 6–20)
CO2: 24 mmol/L (ref 22–32)
Calcium: 9.2 mg/dL (ref 8.9–10.3)
Chloride: 102 mmol/L (ref 98–111)
Creatinine, Ser: 0.76 mg/dL (ref 0.44–1.00)
GFR, Estimated: >60 mL/min (ref >60)
Glucose, Bld: 118 mg/dL — ABNORMAL HIGH (ref 70–99)
Potassium: 3.6 mmol/L (ref 3.5–5.1)
Sodium: 137 mmol/L (ref 135–145)
Total Bilirubin: 0.4 mg/dL (ref 0.3–1.2)
Total Protein: 6.8 g/dL (ref 6.5–8.1)

## 2023-02-18 LAB — PROCALCITONIN: Procalcitonin: 0.32 ng/mL

## 2023-02-18 MED ORDER — GABAPENTIN 400 MG PO CAPS: 400.0000 mg | ORAL_CAPSULE | Freq: Three times a day (TID) | ORAL | Status: DC | PRN

## 2023-02-18 MED ORDER — NAPROXEN 500 MG PO TABS
500.0000 mg | ORAL_TABLET | Freq: Two times a day (BID) | ORAL | Status: DC | PRN
  Filled 2023-02-18: qty 1

## 2023-02-18 MED ORDER — SODIUM CHLORIDE 0.9 % IV BOLUS
1000.0000 mL | Freq: Once | INTRAVENOUS | Status: AC
  Administered 2023-02-18: 1000 mL via INTRAVENOUS

## 2023-02-18 MED ORDER — NYSTATIN 100000 UNIT/ML MT SUSP
5.0000 mL | Freq: Four times a day (QID) | OROMUCOSAL | Status: DC
  Administered 2023-02-18 – 2023-02-21 (×12): 500000 [IU] via ORAL
  Filled 2023-02-18 (×10): qty 5

## 2023-02-18 MED ORDER — LINACLOTIDE 145 MCG PO CAPS
145.0000 ug | ORAL_CAPSULE | Freq: Every day | ORAL | Status: DC
  Administered 2023-02-20: 145 ug via ORAL
  Filled 2023-02-18 (×3): qty 1

## 2023-02-18 MED ORDER — ALUM & MAG HYDROXIDE-SIMETH 200-200-20 MG/5ML PO SUSP
15.0000 mL | Freq: Four times a day (QID) | ORAL | Status: DC | PRN
  Administered 2023-02-18 – 2023-02-21 (×2): 15 mL via ORAL
  Filled 2023-02-18 (×2): qty 30

## 2023-02-18 MED ORDER — ALBUTEROL SULFATE (2.5 MG/3ML) 0.083% IN NEBU: 3.0000 mL | INHALATION_SOLUTION | Freq: Four times a day (QID) | RESPIRATORY_TRACT | Status: DC | PRN

## 2023-02-18 MED ORDER — ACETAMINOPHEN 500 MG PO TABS
1000.0000 mg | ORAL_TABLET | Freq: Four times a day (QID) | ORAL | Status: DC | PRN
  Administered 2023-02-18 – 2023-02-20 (×4): 1000 mg via ORAL
  Filled 2023-02-18 (×4): qty 2

## 2023-02-18 MED ORDER — LIDOCAINE VISCOUS HCL 2 % MT SOLN: 15.0000 mL | OROMUCOSAL | Status: DC | PRN

## 2023-02-18 MED ORDER — PROCHLORPERAZINE MALEATE 10 MG PO TABS: 10.0000 mg | ORAL_TABLET | Freq: Four times a day (QID) | ORAL | Status: DC | PRN

## 2023-02-18 MED ORDER — HYDROCORTISONE (PERIANAL) 2.5 % EX CREA: TOPICAL_CREAM | Freq: Four times a day (QID) | CUTANEOUS | Status: DC | PRN

## 2023-02-18 NOTE — Hospital Course (Addendum)
41 y.o.fw/ anxiety/depression, multinodular goiter hypomagnesemia, hypokalemia, hypocalcemia, hypothyroidism, DU B, fibroids, dysphagia, breast cancer on chemotherapy last treatment 3 days PTA, recently exposed to COVID presented with dyspnea, cough, headache, body aches, fatigue and malaise of 2 days ED course: Initial vital signs were temperature 98.3 F, pulse 110, respirations 20, BP 117/92 mmHg O2 sat 100% on room air.  Lab work: Her urinalysis was hazy with small hemoglobin and large leukocyte esterase.  11-20 RBC, 21-50 WBC and rare bacteria on microscopic examination.  Serum pregnancy test was negative.  CBC showed a white count of 82.2, hemoglobin 11.3 g/dL and platelets 409.  Normal troponin and TSH.  Lactic acid 2.3 mmol/L.  CMP was normal except for potassium of 3.0 mmol/L. Covid + on 02/16/23. Imaging: 2 view chest radiograph done before>no acute cardiopulmonary process. CTA chest with no pulmonary embolism,unchanged 4 mm right middle lobe nodule.  No acute abnormality seen.  No pleural effusion or pneumothorax. The patient received ceftriaxone 2 g IVPB, azithromycin 500 mg IVPB, ketorolac 15 mg IVP, KCl 40 mEq p.o. x 1 and was started on LR at 150 mL/h. She was admitted for for further management.

## 2023-02-18 NOTE — Progress Notes (Signed)
PROGRESS NOTE Laurie Bell  YQM:[id number] DOB: September 16, 1980 DOA: 02/17/2023 PCP: Hoy Register, MD  Brief Narrative/Hospital Course: 42 y.o.fw/ anxiety/depression, multinodular goiter hypomagnesemia, hypokalemia, hypocalcemia, hypothyroidism, DU B, fibroids, dysphagia, breast cancer on chemotherapy last treatment 3 days PTA, recently exposed to COVID presented with dyspnea, cough, headache, body aches, fatigue and malaise of 2 days ED course: Initial vital signs were temperature 98.3 F, pulse 110, respirations 20, BP 117/92 mmHg O2 sat 100% on room air.  Lab work: Her urinalysis was hazy with small hemoglobin and large leukocyte esterase.  11-20 RBC, 21-50 WBC and rare bacteria on microscopic examination.  Serum pregnancy test was negative.  CBC showed a white count of 82.2, hemoglobin 11.3 g/dL and platelets 528.  Normal troponin and TSH.  Lactic acid 2.3 mmol/L.  CMP was normal except for potassium of 3.0 mmol/L. Covid + on 02/16/23. Imaging: 2 view chest radiograph done before>no acute cardiopulmonary process. CTA chest with no pulmonary embolism,unchanged 4 mm right middle lobe nodule.  No acute abnormality seen.  No pleural effusion or pneumothorax. The patient received ceftriaxone 2 g IVPB, azithromycin 500 mg IVPB, ketorolac 15 mg IVP, KCl 40 mEq p.o. x 1 and was started on LR at 150 mL/h. She was admitted for for further management.    Subjective: Patient seen examined this morning Complains of mild chest pain productive cough overall feels better today Overnight remains tachycardic up to 120s, RR in 16-20 BP stable saturating well on room air, afebrile Labs reviewed this morning leukocytosis 82K> 666.8K, stable hemoglobin Procalcitonin 0.32 Last lactic acid 3.9 ON 7 PM  Assessment and Plan: Principal Problem:   Sepsis (HCC) Active Problems:   Hypokalemia   Gastroesophageal reflux disease   Hyperthyroidism   Malignant neoplasm of upper-outer quadrant of right breast in female,  estrogen receptor negative (HCC)   COVID-19 virus infection   Possible sepsis POA-with lactic acidosis/leukocytosis COVID-19 infection: Procalcitonin is borderline, overall afebrile not hypoxic, CTA shows 4 mm nodule no pleural effusion or pneumothorax, no acute findings.  Blood culture 9/7 no growth to date.  Does have abnormal UA with some dysuria site irritation, follow-up urine culture for possible UTI  ( added UrinE cx, as not sent in ED) patient on empiric ceftriaxone azithromycin.  Continue Paxlovid Airborne precaution.  Lactic acid now downtrending will give additional bolus fluids and recheck lactic acid Recent Labs  Lab 02/14/23 1027 02/17/23 1126 02/17/23 1629 02/17/23 1900 02/18/23 0506 02/18/23 0848  WBC 11.9* 82.2*  --   --  66.8*  --   LATICACIDVEN  --  2.3*  --  3.9*  --  3.3*  PROCALCITON  --   --  0.37  --  0.32  --     Breast cancer on chemotherapy: Last chemo earlier in the week does have significant leukocytosis.  Will discuss with her oncology- sees Dr Pamelia Hoit Discussed w/ Dr Shirline Frees.  Dr. Pamelia Hoit to see tomorrow. Recent admission on 8/12 for chemo associated mucositis/oral candidiasis.  Leukocytosis: On admission 82K decreasing, recent blood work has been up to 29k 4 wk ago.  Patient had received pegfilgrastim on 02/16/23.  Hyperthyroidism: Continue methimazole, Toprol she is tachycardic, check TSH free T4.  Recent echo with EF 60 to 65% in August 2024  Hypokalemia: Was replaced  GERD: Continue PPI  Oral thrush: we will add nystatin  Class I Obesity:Patient's Body mass index is 34.06 kg/m. : Will benefit with PCP follow-up, weight loss  healthy lifestyle  DVT prophylaxis: enoxaparin (LOVENOX) injection 40  mg Start: 02/17/23 2200 Code Status:   Code Status: Full Code Family Communication: plan of care discussed with patient at bedside. Patient status is: Inpatient because of COVID-19 infection Level of care: Progressive   Dispo: The patient is from:  home            Anticipated disposition: home ~ 2 days Objective: Vitals last 24 hrs: Vitals:   02/17/23 1906 02/17/23 2005 02/18/23 0419 02/18/23 0742  BP:  120/84 112/71 118/77  Pulse:  (!) 115 (!) 120 (!) 119  Resp: 20  16 20   Temp:  99 F (37.2 C) 99.8 F (37.7 C) 99.4 F (37.4 C)  TempSrc:  Oral Oral Oral  SpO2:  98% 97% 98%  Weight:      Height:       Weight change:   Physical Examination: General exam: alert awake, older than stated age HEENT:Oral mucosa moist, Ear/Nose WNL grossly Respiratory system: bilaterally clear BS, no use of accessory muscle Cardiovascular system: S1 & S2 +, No JVD. Gastrointestinal system: Abdomen soft,NT,ND, BS+ Nervous System:Alert, awake, moving extremities. Extremities: LE edema neg distal peripheral pulses palpable.  Skin: No rashes,no icterus. MSK: Normal muscle bulk,tone, power  Medications reviewed:  Scheduled Meds:  cyclobenzaprine  10 mg Oral QHS   enoxaparin (LOVENOX) injection  40 mg Subcutaneous Q24H   hydrOXYzine  25 mg Oral TID   [START ON 02/19/2023] methIMAzole  5 mg Oral Once per day on Monday Tuesday Wednesday Thursday Friday   metoprolol succinate  12.5 mg Oral Daily   nirmatrelvir/ritonavir  3 tablet Oral BID   pantoprazole  40 mg Oral Daily   Continuous Infusions:  azithromycin Stopped (02/17/23 1556)   cefTRIAXone (ROCEPHIN)  IV Stopped (02/17/23 1556)    Diet Order             Diet Heart Room service appropriate? Yes; Fluid consistency: Thin  Diet effective now                    Intake/Output Summary (Last 24 hours) at 02/18/2023 1138 Last data filed at 02/18/2023 1100 Gross per 24 hour  Intake 2742.5 ml  Output --  Net 2742.5 ml   Net IO Since Admission: 2,742.5 mL [02/18/23 1138]  Wt Readings from Last 3 Encounters:  02/17/23 95.7 kg  02/14/23 97 kg  01/29/23 97.6 kg     Unresulted Labs (From admission, onward)     Start     Ordered   02/19/23 0500  Basic metabolic panel  Daily,   R       02/18/23 0823   02/19/23 0500  CBC with Differential/Platelet  Daily,   R      02/18/23 0823   02/18/23 0844  Urine Culture (for pregnant, neutropenic or urologic patients or patients with an indwelling urinary catheter)  (Urine Labs)  Add-on,   AD       Question:  Indication  Answer:  Dysuria   02/18/23 0843   02/18/23 0824  C-reactive protein  Once,   R        02/18/23 0824          Data Reviewed: I have personally reviewed following labs and imaging studies CBC: Recent Labs  Lab 02/14/23 1027 02/17/23 1126 02/18/23 0506  WBC 11.9* 82.2* 66.8*  NEUTROABS 7.5  --   --   HGB 11.8* 11.3* 11.1*  HCT 35.0* 33.6* 33.4*  MCV 85.6 87.7 86.8  PLT 180 173 168   Basic Metabolic Panel:  Recent Labs  Lab 02/14/23 1027 02/17/23 1126 02/17/23 1629 02/18/23 0506  NA 141 137  --  137  K 3.4* 3.0*  --  3.6  CL 107 102  --  102  CO2 26 26  --  24  GLUCOSE 113* 94  --  118*  BUN 8 11  --  9  CREATININE 0.77 0.72  --  0.76  CALCIUM 9.1 8.9  --  9.2  MG  --   --  1.9  --   PHOS  --   --  2.9  --    GFR: Estimated Creatinine Clearance: 106.9 mL/min (by C-G formula based on SCr of 0.76 mg/dL). Liver Function Tests: Recent Labs  Lab 02/14/23 1027 02/17/23 1126 02/18/23 0506  AST 23 15 14*  ALT 33 23 19  ALKPHOS 134* 119 134*  BILITOT 0.3 0.6 0.4  PROT 6.9 6.8 6.8  ALBUMIN 4.1 3.6 3.6   Recent Labs    02/17/23 1336  TSH 0.566   Sepsis Labs: Recent Labs  Lab 02/17/23 1126 02/17/23 1629 02/17/23 1900 02/18/23 0506 02/18/23 0848  PROCALCITON  --  0.37  --  0.32  --   LATICACIDVEN 2.3*  --  3.9*  --  3.3*    Recent Results (from the past 240 hour(s))  SARS CORONAVIRUS 2 (TAT 6-24 HRS) Anterior Nasal Swab     Status: Abnormal   Collection Time: 02/16/23  8:32 AM   Specimen: Anterior Nasal Swab  Result Value Ref Range Status   SARS Coronavirus 2 POSITIVE (A) NEGATIVE Final    Comment: (NOTE) SARS-CoV-2 target nucleic acids are DETECTED.  The SARS-CoV-2 RNA is  generally detectable in upper and lower respiratory specimens during the acute phase of infection. Positive results are indicative of the presence of SARS-CoV-2 RNA. Clinical correlation with patient history and other diagnostic information is  necessary to determine patient infection status. Positive results do not rule out bacterial infection or co-infection with other viruses.  The expected result is Negative.  Fact Sheet for Patients: HairSlick.no  Fact Sheet for Healthcare Providers: quierodirigir.com  This test is not yet approved or cleared by the [place] FDA and  has been authorized for detection and/or diagnosis of SARS-CoV-2 by FDA under an Emergency Use Authorization (EUA). This EUA will remain  in effect (meaning this test can be used) for the duration of the COVID-19 declaration under Section 564(b)(1) of the Act, 21 U. S.C. section 360bbb-3(b)(1), unless the authorization is terminated or revoked sooner.   Performed at [facility], [address] [place], [place] [address]   Blood culture (routine x 2)     Status: None (Preliminary result)   Collection Time: 02/17/23 11:26 AM   Specimen: Site Not Specified; Blood  Result Value Ref Range Status   Specimen Description   Final    SITE NOT SPECIFIED Performed at [facility], [address] [place], [place] [address]    Special Requests   Final    BOTTLES DRAWN AEROBIC AND ANAEROBIC Blood Culture results may not be optimal due to an excessive volume of blood received in culture bottles Performed at [facility], [address] [place], [place] [address]    Culture   Final    NO GROWTH < 24 HOURS Performed at [facility], [address] [place], [place] [address]    Report Status PENDING  Incomplete  Blood culture (routine x 2)  Status: None (Preliminary result)   Collection Time: 02/17/23 11:26 AM    Specimen: Site Not Specified; Blood  Result Value Ref Range Status   Specimen Description   Final    SITE NOT SPECIFIED Performed at [facility], [address] [place], [place] [address]    Special Requests   Final    BOTTLES DRAWN AEROBIC AND ANAEROBIC Blood Culture results may not be optimal due to an excessive volume of blood received in culture bottles Performed at [facility], [address] [place], [place] [address]    Culture   Final    NO GROWTH < 24 HOURS Performed at [facility], [address] [place], [place] [address]    Report Status PENDING  Incomplete    Antimicrobials: Anti-infectives (From admission, onward)    Start     Dose/Rate Route Frequency Ordered Stop   02/17/23 1800  nirmatrelvir/ritonavir (PAXLOVID) 3 tablet        3 tablet Oral 2 times daily 02/17/23 1527 02/22/23 2159   02/17/23 1315  cefTRIAXone (ROCEPHIN) 2 g in sodium chloride 0.9 % 100 mL IVPB        2 g 200 mL/hr over 30 Minutes Intravenous Every 24 hours 02/17/23 1309 02/22/23 1314   02/17/23 1315  azithromycin (ZITHROMAX) 500 mg in sodium chloride 0.9 % 250 mL IVPB        500 mg 250 mL/hr over 60 Minutes Intravenous Every 24 hours 02/17/23 1309 02/22/23 1314      Culture/Microbiology    Component Value Date/Time   SDES  02/17/2023 1126    SITE NOT SPECIFIED Performed at [facility], [address] [place], [place] [address]    SDES  02/17/2023 1126    SITE NOT SPECIFIED Performed at [facility], [address] [place], [place] [address]    SPECREQUEST  02/17/2023 1126    BOTTLES DRAWN AEROBIC AND ANAEROBIC Blood Culture results may not be optimal due to an excessive volume of blood received in culture bottles Performed at [facility], [address], [place], [place] [address]    SPECREQUEST  02/17/2023 1126    BOTTLES DRAWN AEROBIC AND ANAEROBIC Blood Culture results  may not be optimal due to an excessive volume of blood received in culture bottles Performed at [facility], [address] [place], [place] [address]    CULT  02/17/2023 1126    NO GROWTH < 24 HOURS Performed at [facility], [address], [place], [place] [address]    CULT  02/17/2023 1126    NO GROWTH < 24 HOURS Performed at [facility], [address], [place], [place] [address]    REPTSTATUS PENDING 02/17/2023 1126   REPTSTATUS PENDING 02/17/2023 1126     Radiology Studies: CT Angio Chest PE W/Cm &/Or Wo Cm  Result Date: 02/17/2023 CLINICAL DATA:  Shortness of breath. History of breast cancer. Productive cough. EXAM: CT ANGIOGRAPHY CHEST WITH CONTRAST TECHNIQUE: Multidetector CT imaging of the chest was performed using the standard protocol during bolus administration of intravenous contrast. Multiplanar CT image reconstructions and MIPs were obtained to evaluate the vascular anatomy. RADIATION DOSE REDUCTION: This exam was performed according to the departmental dose-optimization program which includes automated exposure control, adjustment of the mA and/or kV according to patient size and/or use of iterative reconstruction technique. CONTRAST:  75mL OMNIPAQUE IOHEXOL 350 MG/ML SOLN COMPARISON:  January 21, 2023.  July 24, 2015. FINDINGS: Cardiovascular: Satisfactory opacification of the pulmonary arteries to the segmental level. No evidence of pulmonary embolism. Normal heart size. No pericardial effusion. Mediastinum/Nodes: No enlarged mediastinal, hilar, or axillary lymph nodes. Thyroid gland, trachea, and esophagus demonstrate no significant findings. Lungs/Pleura: 4 mm right middle lobe nodule is noted on image number 66 of series 12 which is unchanged compared to prior exam of 2017. No acute abnormality seen. No pleural effusion or pneumothorax. Upper Abdomen: No acute abnormality. Musculoskeletal: No chest wall abnormality. No acute or significant  osseous findings. Review of the MIP images confirms the above findings. IMPRESSION: No definite evidence of pulmonary embolus. Electronically Signed   By: Lupita Raider M.D.   On: 02/17/2023 13:52     LOS: 1 day   Lanae Boast, MD [facility]  02/18/2023, 11:38 AM

## 2023-02-18 NOTE — Plan of Care (Signed)

## 2023-02-18 NOTE — Progress Notes (Signed)
   Date and time results received: 02/18/23 6:13am  Test:  WBC  Critical Value: 66.8  Name of Provider Notified: A. Virgel Manifold, NP  Orders Received? Or Actions Taken?:  continue to monitor

## 2023-02-19 ENCOUNTER — Inpatient Hospital Stay (HOSPITAL_COMMUNITY): Payer: 59

## 2023-02-19 DIAGNOSIS — A419 Sepsis, unspecified organism: Secondary | ICD-10-CM | POA: Diagnosis not present

## 2023-02-19 LAB — CBC WITH DIFFERENTIAL/PLATELET
Abs Immature Granulocytes: 2.98 K/uL — ABNORMAL HIGH (ref 0.00–0.07)
Basophils Absolute: 0.1 K/uL (ref 0.0–0.1)
Basophils Relative: 0 %
Eosinophils Absolute: 0.1 K/uL (ref 0.0–0.5)
Eosinophils Relative: 0 %
HCT: 35.9 % — ABNORMAL LOW (ref 36.0–46.0)
Hemoglobin: 11.8 g/dL — ABNORMAL LOW (ref 12.0–15.0)
Immature Granulocytes: 8 %
Lymphocytes Relative: 5 %
Lymphs Abs: 1.7 K/uL (ref 0.7–4.0)
MCH: 28.7 pg (ref 26.0–34.0)
MCHC: 32.9 g/dL (ref 30.0–36.0)
MCV: 87.3 fL (ref 80.0–100.0)
Monocytes Absolute: 0.3 K/uL (ref 0.1–1.0)
Monocytes Relative: 1 %
Neutro Abs: 33.3 K/uL — ABNORMAL HIGH (ref 1.7–7.7)
Neutrophils Relative %: 86 %
Platelets: 211 K/uL (ref 150–400)
RBC: 4.11 MIL/uL (ref 3.87–5.11)
RDW: 17 % — ABNORMAL HIGH (ref 11.5–15.5)
WBC: 38.5 K/uL — ABNORMAL HIGH (ref 4.0–10.5)
nRBC: 0 % (ref 0.0–0.2)

## 2023-02-19 LAB — BASIC METABOLIC PANEL WITH GFR
Anion gap: 13 (ref 5–15)
BUN: 10 mg/dL (ref 6–20)
CO2: 22 mmol/L (ref 22–32)
Calcium: 9.1 mg/dL (ref 8.9–10.3)
Chloride: 101 mmol/L (ref 98–111)
Creatinine, Ser: 0.79 mg/dL (ref 0.44–1.00)
GFR, Estimated: >60 mL/min (ref >60)
Glucose, Bld: 136 mg/dL — ABNORMAL HIGH (ref 70–99)
Potassium: 3.7 mmol/L (ref 3.5–5.1)
Sodium: 136 mmol/L (ref 135–145)

## 2023-02-19 LAB — URINE CULTURE: Culture: <10000 — AB

## 2023-02-19 LAB — T4, FREE: Free T4: 0.72 ng/dL (ref 0.61–1.12)

## 2023-02-19 LAB — TSH: TSH: 1.076 u[IU]/mL (ref 0.350–4.500)

## 2023-02-19 MED ORDER — SENNOSIDES-DOCUSATE SODIUM 8.6-50 MG PO TABS
1.0000 | ORAL_TABLET | Freq: Two times a day (BID) | ORAL | Status: DC
  Administered 2023-02-19 – 2023-02-21 (×5): 1 via ORAL
  Filled 2023-02-19 (×5): qty 1

## 2023-02-19 MED ORDER — ONDANSETRON 4 MG PO TBDP
4.0000 mg | ORAL_TABLET | Freq: Three times a day (TID) | ORAL | Status: DC | PRN
  Administered 2023-02-19: 4 mg via ORAL
  Filled 2023-02-19: qty 1

## 2023-02-19 MED ORDER — HYDROCODONE BIT-HOMATROP MBR 5-1.5 MG/5ML PO SOLN
5.0000 mL | ORAL | Status: DC | PRN
  Administered 2023-02-19 – 2023-02-21 (×4): 5 mL via ORAL
  Filled 2023-02-19 (×4): qty 5

## 2023-02-19 MED ORDER — POLYETHYLENE GLYCOL 3350 17 G PO PACK: 17.0000 g | PACK | Freq: Every day | ORAL | Status: DC | PRN

## 2023-02-19 MED ORDER — SODIUM CHLORIDE 0.9 % IV SOLN: INTRAVENOUS | Status: AC

## 2023-02-19 NOTE — TOC Initial Note (Signed)
Transition of Care [facility]) - Initial/Assessment Note    Patient Details  Name: Laurie Bell MRN: [id number] Date of Birth: 12/01/1980  Transition of Care [facility]) CM/SW Contact:    Lanier Clam, RN Phone Number: 02/19/2023, 2:48 PM  Clinical Narrative:   d/c plan home.                  Barriers to Discharge: Continued Medical Work up   Patient Goals and CMS Choice Patient states their goals for this hospitalization and ongoing recovery are:: Home CMS Medicare.gov Compare Post Acute Care list provided to:: Patient   [place] ownership interest in [facility]provided to:: Patient    Expected Discharge Plan and Services   Discharge Planning Services: CM Consult   Living arrangements for the past 2 months: Single Family Home                                      Prior Living Arrangements/Services Living arrangements for the past 2 months: Single Family Home Lives with:: Significant Other Patient language and need for interpreter reviewed:: Yes Do you feel safe going back to the place where you live?: Yes      Need for Family Participation in Patient Care: Yes (Comment) Care giver support system in place?: Yes (comment)   Criminal Activity/Legal Involvement Pertinent to Current Situation/Hospitalization: No - Comment as needed  Activities of Daily Living Home Assistive Devices/Equipment: None ADL Screening (condition at time of admission) Patient's cognitive ability adequate to safely complete daily activities?: Yes Is the patient deaf or have difficulty hearing?: No Does the patient have difficulty seeing, even when wearing glasses/contacts?: No Does the patient have difficulty concentrating, remembering, or making decisions?: No Patient able to express need for assistance with ADLs?: Yes Does the patient have difficulty dressing or bathing?: No Independently performs ADLs?: Yes (appropriate for developmental age) Does the patient have difficulty  walking or climbing stairs?: No Weakness of Legs: None Weakness of Arms/Hands: None  Permission Sought/Granted Permission sought to share information with : Case Manager Permission granted to share information with : Yes, Verbal Permission Granted  Share Information with NAME: Case Manager           Emotional Assessment Appearance:: Appears stated age Attitude/Demeanor/Rapport: Gracious Affect (typically observed): Accepting Orientation: : Oriented to Self, Oriented to Place, Oriented to  Time, Oriented to Situation Alcohol / Substance Use: Not Applicable Psych Involvement: No (comment)  Admission diagnosis:  Sepsis (HCC) [A41.9] Sepsis without acute organ dysfunction, due to unspecified organism (HCC) [A41.9] COVID [U07.1] Patient Active Problem List   Diagnosis Date Noted   Sepsis (HCC) 02/17/2023   COVID-19 virus infection 02/17/2023   Lower extremity pain, anterior, right 01/22/2023   SIRS (systemic inflammatory response syndrome) (HCC) 01/21/2023   Port-A-Cath in place 01/01/2023   Malignant neoplasm of upper-outer quadrant of right breast in female, estrogen receptor negative (HCC) 10/09/2022   Multinodular goiter 03/22/2022   Hyperthyroidism 03/22/2022   MDD (major depressive disorder), recurrent episode, moderate (HCC) 11/03/2021   GAD (generalized anxiety disorder) 11/03/2021   Neck pain 10/28/2021   Cervical high risk HPV (human papillomavirus) test positive 10/17/2021   Irregular heart rhythm 04/15/2021   Numbness on left side 04/15/2021   Dysphagia 03/17/2021   Hypomagnesemia 02/23/2021   Gastroesophageal reflux disease    Hypokalemia 08/24/2020   Atypical chest pain 08/24/2020   Nausea and vomiting 08/24/2020  Hypocalcemia 08/24/2020   DUB (dysfunctional uterine bleeding) 01/12/2017   Fibroid, uterine 01/12/2017   PCP:  Hoy Register, MD Pharmacy:   [facility] [phone] [place], [place] - [address]  [place] [address] Phone: [phone] Fax: [phone]  [facility] [address] [place] - [address] [place] [address] Phone: [phone] Fax: [phone]     Social Determinants of Health (SDOH) Social History: SDOH Screenings   Food Insecurity: No Food Insecurity (02/17/2023)  Housing: Low Risk  (02/17/2023)  Transportation Needs: No Transportation Needs (02/17/2023)  Utilities: Not At Risk (02/17/2023)  Alcohol Screen: Low Risk  (11/03/2021)  Depression (PHQ2-9): Medium Risk (11/29/2022)  Financial Resource Strain: Medium Risk (11/03/2021)  Physical Activity: Sufficiently Active (11/03/2021)  Social Connections: Moderately Isolated (11/03/2021)  Stress: Stress Concern Present (11/03/2021)  Tobacco Use: Low Risk  (02/17/2023)  Recent Concern: Tobacco Use - Medium Risk (02/15/2023)   Received from [facility]   SDOH Interventions:     Readmission Risk Interventions     No data to display

## 2023-02-19 NOTE — Progress Notes (Signed)
Patient C/O mid chest pain, rated 8 out of 10, increase cough, which she had during the day and with tylenol the chest pain got better, x-ray was also done; she also C/O left calf pain. She is Yellow MEWs because her heart rate have been up too, sustaining in the 120s. PRN med given and On call provider J. Daniels-NP notified.

## 2023-02-19 NOTE — Progress Notes (Signed)
PROGRESS NOTE Laurie Bell  XTG:[id number] DOB: 1980-11-26 DOA: 02/17/2023 PCP: Hoy Register, MD  Brief Narrative/Hospital Course: 42 y.o.fw/ anxiety/depression, multinodular goiter hypomagnesemia, hypokalemia, hypocalcemia, hypothyroidism, DU B, fibroids, dysphagia, breast cancer on chemotherapy last treatment 3 days PTA, recently exposed to COVID presented with dyspnea, cough, headache, body aches, fatigue and malaise of 2 days ED course: Initial vital signs were temperature 98.3 F, pulse 110, respirations 20, BP 117/92 mmHg O2 sat 100% on room air.  Lab work: Her urinalysis was hazy with small hemoglobin and large leukocyte esterase.  11-20 RBC, 21-50 WBC and rare bacteria on microscopic examination.  Serum pregnancy test was negative.  CBC showed a white count of 82.2, hemoglobin 11.3 g/dL and platelets 270.  Normal troponin and TSH.  Lactic acid 2.3 mmol/L.  CMP was normal except for potassium of 3.0 mmol/L. Covid + on 02/16/23. Imaging: 2 view chest radiograph done before>no acute cardiopulmonary process. CTA chest with no pulmonary embolism,unchanged 4 mm right middle lobe nodule.  No acute abnormality seen.  No pleural effusion or pneumothorax. The patient received ceftriaxone 2 g IVPB, azithromycin 500 mg IVPB, ketorolac 15 mg IVP, KCl 40 mEq p.o. x 1 and was started on LR at 150 mL/h. She was admitted for for further management.    Subjective: Patient seen and examined Complains of chest pain back pain-pulled muscles, has been coughing Overnight afebrile, Vital stable ongoing tachycardia mild, she is not hypoxic Labs pending this morning subsequently resulted leukocytosis much better BMP stable  Assessment and Plan: Principal Problem:   Sepsis (HCC) Active Problems:   Hypokalemia   Gastroesophageal reflux disease   Hyperthyroidism   Malignant neoplasm of upper-outer quadrant of right breast in female, estrogen receptor negative (HCC)   COVID-19 virus infection   Possible  sepsis POA-with lactic acidosis/leukocytosis-likely UTI COVID-19 infection: Procalcitonin is stable-borderline,overall afebrile not hypoxic, CTA shows 4 mm nodule no pleural effusion or pneumothorax, no acute findings.  Blood culture 9/7 no growth to date.  Does have abnormal UA with some dysuria - likely UTI-Urine culture added on as not sent in ED. continue on empiric ceftriaxone, discontinue azithromycin, continue Paxlovid and airborne precautions for COVID.  Complaining of chest wall pain likely from her persistent cough, added Hycodan, repeating chest x-ray Recent Labs  Lab 02/14/23 1027 02/17/23 1126 02/17/23 1629 02/17/23 1900 02/18/23 0506 02/18/23 0848 02/18/23 1713 02/19/23 0840  WBC 11.9* 82.2*  --   --  66.8*  --   --  38.5*  LATICACIDVEN  --  2.3*  --  3.9*  --  3.3* 1.5  --   PROCALCITON  --   --  0.37  --  0.32  --   --   --    Lactic acidosis: Suspect multifactorial in the setting of dehydration chemotherapy.  It has resolved with aggressive IV fluid hydration  Breast cancer on chemotherapy: Last chemo earlier in the week does have significant leukocytosis.  Dr. Pamelia Hoit has been informed for consult.  Patient reports significant weakness and "chemo hit hard"  Recent admission on 8/12 for chemo associated mucositis/oral candidiasis.  Leukocytosis: On admission 82K decreasing,recent blood work has been up to 29k 4 wk ago.Patient had received pegfilgrastim on 02/16/23.  Likely multifactorial  Hyperthyroidism: Continue methimazole, Toprol. She is tachycardic likely in the setting of dehydration, follow-up TSH is normal TTE 8/24-EF60 to 65%   Hypokalemia: Resolved  GERD: Continue PPI  Oral thrush: Continue  Class I Obesity:Patient's Body mass index is 34.06 kg/m. : Will benefit  with PCP follow-up, weight loss  healthy lifestyle  DVT prophylaxis: enoxaparin (LOVENOX) injection 40 mg Start: 02/17/23 2200 Code Status:   Code Status: Full Code Family Communication:  plan of care discussed with patient at bedside. Patient status is: Inpatient because of COVID-19 infection Level of care: Progressive   Dispo: The patient is from: home            Anticipated disposition: home ~ 2 days Objective: Vitals last 24 hrs: Vitals:   02/18/23 1144 02/18/23 2038 02/19/23 0448 02/19/23 0919  BP: 104/76 112/84 113/80 110/82  Pulse: (!) 107 (!) 109 (!) 110   Resp: 20 18 18 16   Temp: 97.9 F (36.6 C) 98.7 F (37.1 C) 98.3 F (36.8 C) 97.7 F (36.5 C)  TempSrc: Oral Oral Oral Oral  SpO2: 99% 100% 97% 100%  Weight:      Height:       Weight change:   Physical Examination: General exam: alert awake, oriented  HEENT:Oral mucosa moist, Ear/Nose WNL grossly Respiratory system: Not able to take deep breath due to cough/chest wall pain , BS diminished, port in place, no use of accessory muscle Cardiovascular system: S1 & S2 +, No JVD. Gastrointestinal system: Abdomen soft,NT,ND, BS+ Nervous System: Alert, awake, moving all extremities,and following commands. Extremities: LE edema neg,distal peripheral pulses palpable and warm.  Skin: No rashes,no icterus. MSK: Normal muscle bulk,tone, power   Medications reviewed:  Scheduled Meds:  cyclobenzaprine  10 mg Oral QHS   enoxaparin (LOVENOX) injection  40 mg Subcutaneous Q24H   hydrOXYzine  25 mg Oral TID   linaclotide  145 mcg Oral QAC breakfast   methIMAzole  5 mg Oral Once per day on Monday Tuesday Wednesday Thursday Friday   metoprolol succinate  12.5 mg Oral Daily   nirmatrelvir/ritonavir  3 tablet Oral BID   nystatin  5 mL Oral QID   pantoprazole  40 mg Oral Daily   senna-docusate  1 tablet Oral BID   Continuous Infusions:  sodium chloride     cefTRIAXone (ROCEPHIN)  IV 2 g (02/18/23 1535)    Diet Order             Diet Heart Room service appropriate? Yes; Fluid consistency: Thin  Diet effective now                    Intake/Output Summary (Last 24 hours) at 02/19/2023 1051 Last data filed  at 02/19/2023 0948 Gross per 24 hour  Intake 780 ml  Output --  Net 780 ml   Net IO Since Admission: 3,102.5 mL [02/19/23 1051]  Wt Readings from Last 3 Encounters:  02/17/23 95.7 kg  02/14/23 97 kg  01/29/23 97.6 kg     Unresulted Labs (From admission, onward)     Start     Ordered   02/19/23 0500  Basic metabolic panel  Daily,   R      02/18/23 0823   02/19/23 0500  CBC with Differential/Platelet  Daily,   R      02/18/23 0823   02/19/23 0500  T4, free  Tomorrow morning,   R        02/18/23 1141   02/18/23 0844  Urine Culture (for pregnant, neutropenic or urologic patients or patients with an indwelling urinary catheter)  (Urine Labs)  Add-on,   AD       Question:  Indication  Answer:  Dysuria   02/18/23 0843          Data  Reviewed: I have personally reviewed following labs and imaging studies CBC: Recent Labs  Lab 02/14/23 1027 02/17/23 1126 02/18/23 0506 02/19/23 0840  WBC 11.9* 82.2* 66.8* 38.5*  NEUTROABS 7.5  --   --  33.3*  HGB 11.8* 11.3* 11.1* 11.8*  HCT 35.0* 33.6* 33.4* 35.9*  MCV 85.6 87.7 86.8 87.3  PLT 180 173 168 211   Basic Metabolic Panel: Recent Labs  Lab 02/14/23 1027 02/17/23 1126 02/17/23 1629 02/18/23 0506 02/19/23 0840  NA 141 137  --  137 136  K 3.4* 3.0*  --  3.6 3.7  CL 107 102  --  102 101  CO2 26 26  --  24 22  GLUCOSE 113* 94  --  118* 136*  BUN 8 11  --  9 10  CREATININE 0.77 0.72  --  0.76 0.79  CALCIUM 9.1 8.9  --  9.2 9.1  MG  --   --  1.9  --   --   PHOS  --   --  2.9  --   --    GFR: Estimated Creatinine Clearance: 106.9 mL/min (by C-G formula based on SCr of 0.79 mg/dL). Liver Function Tests: Recent Labs  Lab 02/14/23 1027 02/17/23 1126 02/18/23 0506  AST 23 15 14*  ALT 33 23 19  ALKPHOS 134* 119 134*  BILITOT 0.3 0.6 0.4  PROT 6.9 6.8 6.8  ALBUMIN 4.1 3.6 3.6   Recent Labs    02/19/23 0840  TSH 1.076   Sepsis Labs: Recent Labs  Lab 02/17/23 1126 02/17/23 1629 02/17/23 1900 02/18/23 0506  02/18/23 0848 02/18/23 1713  PROCALCITON  --  0.37  --  0.32  --   --   LATICACIDVEN 2.3*  --  3.9*  --  3.3* 1.5    Recent Results (from the past 240 hour(s))  SARS CORONAVIRUS 2 (TAT 6-24 HRS) Anterior Nasal Swab     Status: Abnormal   Collection Time: 02/16/23  8:32 AM   Specimen: Anterior Nasal Swab  Result Value Ref Range Status   SARS Coronavirus 2 POSITIVE (A) NEGATIVE Final    Comment: (NOTE) SARS-CoV-2 target nucleic acids are DETECTED.  The SARS-CoV-2 RNA is generally detectable in upper and lower respiratory specimens during the acute phase of infection. Positive results are indicative of the presence of SARS-CoV-2 RNA. Clinical correlation with patient history and other diagnostic information is  necessary to determine patient infection status. Positive results do not rule out bacterial infection or co-infection with other viruses.  The expected result is Negative.  Fact Sheet for Patients: HairSlick.no  Fact Sheet for Healthcare Providers: quierodirigir.com  This test is not yet approved or cleared by the [place] FDA and  has been authorized for detection and/or diagnosis of SARS-CoV-2 by FDA under an Emergency Use Authorization (EUA). This EUA will remain  in effect (meaning this test can be used) for the duration of the COVID-19 declaration under Section 564(b)(1) of the Act, 21 U. S.C. section 360bbb-3(b)(1), unless the authorization is terminated or revoked sooner.   Performed at [facility], [address], [place], [place] [address]   Blood culture (routine x 2)     Status: None (Preliminary result)   Collection Time: 02/17/23 11:26 AM   Specimen: Site Not Specified; Blood  Result Value Ref Range Status   Specimen Description   Final    SITE NOT SPECIFIED Performed at [facility], [address], [place], [place] [address]  Special Requests   Final    BOTTLES  DRAWN AEROBIC AND ANAEROBIC Blood Culture results may not be optimal due to an excessive volume of blood received in culture bottles Performed at [facility], [address], [place], [place] [address]    Culture   Final    NO GROWTH 2 DAYS Performed at [facility], [address] [place], [place] [address]    Report Status PENDING  Incomplete  Blood culture (routine x 2)     Status: None (Preliminary result)   Collection Time: 02/17/23 11:26 AM   Specimen: Site Not Specified; Blood  Result Value Ref Range Status   Specimen Description   Final    SITE NOT SPECIFIED Performed at [facility], [address] [place], [place] [address]    Special Requests   Final    BOTTLES DRAWN AEROBIC AND ANAEROBIC Blood Culture results may not be optimal due to an excessive volume of blood received in culture bottles Performed at [facility], [address] [place], [place] [address]    Culture   Final    NO GROWTH 2 DAYS Performed at [facility], [address] [place], [place] [address]    Report Status PENDING  Incomplete    Antimicrobials: Anti-infectives (From admission, onward)    Start     Dose/Rate Route Frequency Ordered Stop   02/17/23 1800  nirmatrelvir/ritonavir (PAXLOVID) 3 tablet        3 tablet Oral 2 times daily 02/17/23 1527 02/22/23 2159   02/17/23 1315  cefTRIAXone (ROCEPHIN) 2 g in sodium chloride 0.9 % 100 mL IVPB        2 g 200 mL/hr over 30 Minutes Intravenous Every 24 hours 02/17/23 1309 02/22/23 1314   02/17/23 1315  azithromycin (ZITHROMAX) 500 mg in sodium chloride 0.9 % 250 mL IVPB  Status:  Discontinued        500 mg 250 mL/hr over 60 Minutes Intravenous Every 24 hours 02/17/23 1309 02/19/23 0833      Culture/Microbiology    Component Value Date/Time   SDES  02/17/2023 1126    SITE NOT SPECIFIED Performed at [facility], [address], [place], [place]  [address]    SDES  02/17/2023 1126    SITE NOT SPECIFIED Performed at [facility], [address], [place], [place] [address]    SPECREQUEST  02/17/2023 1126    BOTTLES DRAWN AEROBIC AND ANAEROBIC Blood Culture results may not be optimal due to an excessive volume of blood received in culture bottles Performed at [facility], [address], [place], [place] [address]    SPECREQUEST  02/17/2023 1126    BOTTLES DRAWN AEROBIC AND ANAEROBIC Blood Culture results may not be optimal due to an excessive volume of blood received in culture bottles Performed at [facility], [address], [place], [place] [address]    CULT  02/17/2023 1126    NO GROWTH 2 DAYS Performed at [facility], [address] [place], [place] [address]    CULT  02/17/2023 1126    NO GROWTH 2 DAYS Performed at [facility], [address] [place], [place] [address]    REPTSTATUS PENDING 02/17/2023 1126   REPTSTATUS PENDING 02/17/2023 1126     Radiology Studies: CT Angio Chest PE W/Cm &/Or Wo Cm  Result Date: 02/17/2023 CLINICAL DATA:  Shortness of breath. History of  breast cancer. Productive cough. EXAM: CT ANGIOGRAPHY CHEST WITH CONTRAST TECHNIQUE: Multidetector CT imaging of the chest was performed using the standard protocol during bolus administration of intravenous contrast. Multiplanar CT image reconstructions and MIPs were obtained to evaluate the vascular anatomy. RADIATION DOSE REDUCTION: This exam was performed according to the departmental dose-optimization program which includes automated exposure control, adjustment of the mA and/or kV according to patient size and/or use of iterative reconstruction technique. CONTRAST:  75mL OMNIPAQUE IOHEXOL 350 MG/ML SOLN COMPARISON:  January 21, 2023.  July 24, 2015. FINDINGS: Cardiovascular: Satisfactory opacification of the pulmonary arteries to the segmental level. No evidence of pulmonary  embolism. Normal heart size. No pericardial effusion. Mediastinum/Nodes: No enlarged mediastinal, hilar, or axillary lymph nodes. Thyroid gland, trachea, and esophagus demonstrate no significant findings. Lungs/Pleura: 4 mm right middle lobe nodule is noted on image number 66 of series 12 which is unchanged compared to prior exam of 2017. No acute abnormality seen. No pleural effusion or pneumothorax. Upper Abdomen: No acute abnormality. Musculoskeletal: No chest wall abnormality. No acute or significant osseous findings. Review of the MIP images confirms the above findings. IMPRESSION: No definite evidence of pulmonary embolus. Electronically Signed   By: Lupita Raider M.D.   On: 02/17/2023 13:52     LOS: 2 days   Lanae Boast, MD [facility]  02/19/2023, 10:51 AM

## 2023-02-19 NOTE — Plan of Care (Signed)
  Problem: Respiratory: Goal: Will maintain a patent airway Outcome: Progressing Goal: Complications related to the disease process, condition or treatment will be avoided or minimized Outcome: Progressing   Problem: Education: Goal: Knowledge of General Education information will improve Description: Including pain rating scale, medication(s)/side effects and non-pharmacologic comfort measures Outcome: Progressing   Problem: Clinical Measurements: Goal: Ability to maintain clinical measurements within normal limits will improve Outcome: Progressing Goal: Will remain free from infection Outcome: Progressing Goal: Diagnostic test results will improve Outcome: Progressing Goal: Respiratory complications will improve Outcome: Progressing Goal: Cardiovascular complication will be avoided Outcome: Progressing   Problem: Pain Managment: Goal: General experience of comfort will improve Outcome: Progressing

## 2023-02-19 NOTE — Progress Notes (Signed)
Patient refused lab draw, asked for the Phlebotomist to come back when she is awake.

## 2023-02-20 ENCOUNTER — Inpatient Hospital Stay (HOSPITAL_COMMUNITY): Payer: 59

## 2023-02-20 ENCOUNTER — Ambulatory Visit: Payer: 59 | Admitting: Internal Medicine

## 2023-02-20 DIAGNOSIS — M79605 Pain in left leg: Secondary | ICD-10-CM

## 2023-02-20 DIAGNOSIS — C50411 Malignant neoplasm of upper-outer quadrant of right female breast: Secondary | ICD-10-CM

## 2023-02-20 DIAGNOSIS — A419 Sepsis, unspecified organism: Secondary | ICD-10-CM | POA: Diagnosis not present

## 2023-02-20 DIAGNOSIS — Z171 Estrogen receptor negative status [ER-]: Secondary | ICD-10-CM | POA: Diagnosis not present

## 2023-02-20 LAB — CBC WITH DIFFERENTIAL/PLATELET
Abs Immature Granulocytes: 0.23 10*3/uL — ABNORMAL HIGH (ref 0.00–0.07)
Basophils Absolute: 0 10*3/uL (ref 0.0–0.1)
Basophils Relative: 0 %
Eosinophils Absolute: 0.1 10*3/uL (ref 0.0–0.5)
Eosinophils Relative: 0 %
HCT: 36.5 % (ref 36.0–46.0)
Hemoglobin: 11.7 g/dL — ABNORMAL LOW (ref 12.0–15.0)
Immature Granulocytes: 2 %
Lymphocytes Relative: 11 %
Lymphs Abs: 1.5 10*3/uL (ref 0.7–4.0)
MCH: 28.7 pg (ref 26.0–34.0)
MCHC: 32.1 g/dL (ref 30.0–36.0)
MCV: 89.7 fL (ref 80.0–100.0)
Monocytes Absolute: 0.3 10*3/uL (ref 0.1–1.0)
Monocytes Relative: 2 %
Neutro Abs: 11.6 10*3/uL — ABNORMAL HIGH (ref 1.7–7.7)
Neutrophils Relative %: 85 %
Platelets: 231 10*3/uL (ref 150–400)
RBC: 4.07 MIL/uL (ref 3.87–5.11)
RDW: 16.7 % — ABNORMAL HIGH (ref 11.5–15.5)
WBC: 13.6 10*3/uL — ABNORMAL HIGH (ref 4.0–10.5)
nRBC: 0 % (ref 0.0–0.2)

## 2023-02-20 LAB — BASIC METABOLIC PANEL
Anion gap: 12 (ref 5–15)
BUN: 8 mg/dL (ref 6–20)
CO2: 21 mmol/L — ABNORMAL LOW (ref 22–32)
Calcium: 9.1 mg/dL (ref 8.9–10.3)
Chloride: 103 mmol/L (ref 98–111)
Creatinine, Ser: 0.68 mg/dL (ref 0.44–1.00)
GFR, Estimated: >60 mL/min (ref >60)
Glucose, Bld: 113 mg/dL — ABNORMAL HIGH (ref 70–99)
Potassium: 3.6 mmol/L (ref 3.5–5.1)
Sodium: 136 mmol/L (ref 135–145)

## 2023-02-20 NOTE — Progress Notes (Signed)
PROGRESS NOTE Laurie Bell  RUE:[id number] DOB: 10-14-1980 DOA: 02/17/2023 PCP: Hoy Register, MD  Brief Narrative/Hospital Course: 42 y.o.fw/ anxiety/depression, multinodular goiter hypomagnesemia, hypokalemia, hypocalcemia, hypothyroidism, DU B, fibroids, dysphagia, breast cancer on chemotherapy last treatment 3 days PTA, recently exposed to COVID presented with dyspnea, cough, headache, body aches, fatigue and malaise of 2 days ED course: Initial vital signs were temperature 98.3 F, pulse 110, respirations 20, BP 117/92 mmHg O2 sat 100% on room air.  Lab work: Her urinalysis was hazy with small hemoglobin and large leukocyte esterase.  11-20 RBC, 21-50 WBC and rare bacteria on microscopic examination.  Serum pregnancy test was negative.  CBC showed a white count of 82.2, hemoglobin 11.3 g/dL and platelets 147.  Normal troponin and TSH.  Lactic acid 2.3 mmol/L.  CMP was normal except for potassium of 3.0 mmol/L. Covid + on 02/16/23. Imaging: 2 view chest radiograph done before>no acute cardiopulmonary process. CTA chest with no pulmonary embolism,unchanged 4 mm right middle lobe nodule.  No acute abnormality seen.  No pleural effusion or pneumothorax. The patient received ceftriaxone 2 g IVPB, azithromycin 500 mg IVPB, ketorolac 15 mg IVP, KCl 40 mEq p.o. x 1 and was started on LR at 150 mL/h. She was admitted for for further management.    Subjective: Patient seen and examined this morning States she feels better today with less cough Complains of pain on the left But reports the injection post chemo sometimes does not Overnight patient has been afebrile  She has been tachycardic especially with ambulation  Assessment and Plan: Principal Problem:   Sepsis (HCC) Active Problems:   Hypokalemia   Gastroesophageal reflux disease   Hyperthyroidism   Malignant neoplasm of upper-outer quadrant of right breast in female, estrogen receptor negative (HCC)   COVID-19 virus infection    Possible sepsis POA-with lactic acidosis/leukocytosis-likely UTI COVID-19 infection: Procalcitonin is stable-borderline,overall afebrile not hypoxic, CTA shows 4 mm nodule no pleural effusion or pneumothorax, no acute findings.  Blood culture 9/7 no growth to date.  She had urinary symptoms and UA with pyuria continued on ceftriaxone urine culture insignificant growth complete antibiotic x 5 days for UTI.  Overall leukocytosis improving significantly as below. Continue IV fluids supportive care, antitussive's.  Repeat chest x-ray on 9/9 stable Recent Labs  Lab 02/14/23 1027 02/17/23 1126 02/17/23 1629 02/17/23 1900 02/18/23 0506 02/18/23 0848 02/18/23 1713 02/19/23 0840 02/20/23 0851  WBC 11.9* 82.2*  --   --  66.8*  --   --  38.5* 13.6*  LATICACIDVEN  --  2.3*  --  3.9*  --  3.3* 1.5  --   --   PROCALCITON  --   --  0.37  --  0.32  --   --   --   --    Lactic acidosis: Suspect multifactorial in the setting of dehydration chemotherapy.  It has resolved with aggressive IV fluid hydration  Breast cancer on chemotherapy: Last chemo earlier in the week does have significant leukocytosis.  Dr. Pamelia Hoit has been informed for consult.  Patient reports significant weakness and "chemo hit hard"  Recent admission on 8/12 for chemo associated mucositis/oral candidiasis.  Leukocytosis: On admission 82K and now better likely in the setting of pegfilgrastim on 02/16/23 and UTI  Hyperthyroidism: Appears euthyroid based on normal TSH/free T4 continue methimazole, Toprol. S  Sinus tachycardia: In the setting of post chemo, dehydration UTI.  Euthyroid overall continue Toprol BP soft so hold off on increasing Toprol  Hypokalemia: Resolved  Left calf  pain: Negative for DVT and duplex.  Continue symptomatic management  GERD: Continue PPI  Oral thrush: Continue  Class I Obesity:Patient's Body mass index is 34.06 kg/m. : Will benefit with PCP follow-up, weight loss  healthy lifestyle  DVT  prophylaxis: enoxaparin (LOVENOX) injection 40 mg Start: 02/17/23 2200 Code Status:   Code Status: Full Code Family Communication: plan of care discussed with patient at bedside. Patient status is: Inpatient because of COVID-19 infection Level of care: Progressive   Dispo: The patient is from: home            Anticipated disposition: home ~ 1 day Objective: Vitals last 24 hrs: Vitals:   02/19/23 2313 02/20/23 0319 02/20/23 0701 02/20/23 1024  BP: (!) 125/90 110/77    Pulse: (!) 118 (!) 103    Resp: 18 17 18 20   Temp: 98.3 F (36.8 C) 99.1 F (37.3 C)    TempSrc: Oral Oral    SpO2: 100% 100%    Weight:      Height:       Weight change:   Physical Examination: General exam: alert awake, orientedx3 HEENT:Oral mucosa moist, Ear/Nose WNL grossly Respiratory system: Bilaterally clear BS,no use of accessory muscle Cardiovascular system: S1 & S2 +, No JVD. Gastrointestinal system: Abdomen soft,NT,ND, BS+ Nervous System: Alert, awake, moving all extremities,and following commands. Extremities: LE edema neg,distal peripheral pulses palpable and warm.  Skin: No rashes,no icterus. MSK: Normal muscle bulk,tone, power   Medications reviewed:  Scheduled Meds:  cyclobenzaprine  10 mg Oral QHS   enoxaparin (LOVENOX) injection  40 mg Subcutaneous Q24H   hydrOXYzine  25 mg Oral TID   linaclotide  145 mcg Oral QAC breakfast   methIMAzole  5 mg Oral Once per day on Monday Tuesday Wednesday Thursday Friday   metoprolol succinate  12.5 mg Oral Daily   nirmatrelvir/ritonavir  3 tablet Oral BID   nystatin  5 mL Oral QID   pantoprazole  40 mg Oral Daily   senna-docusate  1 tablet Oral BID   Continuous Infusions:  sodium chloride 75 mL/hr at 02/20/23 0600   cefTRIAXone (ROCEPHIN)  IV Stopped (02/19/23 1319)    Diet Order             Diet Heart Room service appropriate? Yes; Fluid consistency: Thin  Diet effective now                    Intake/Output Summary (Last 24 hours) at  02/20/2023 1106 Last data filed at 02/20/2023 0600 Gross per 24 hour  Intake 2039.26 ml  Output --  Net 2039.26 ml   Net IO Since Admission: 5,141.76 mL [02/20/23 1106]  Wt Readings from Last 3 Encounters:  02/17/23 95.7 kg  02/14/23 97 kg  01/29/23 97.6 kg     Unresulted Labs (From admission, onward)     Start     Ordered   02/19/23 0500  Basic metabolic panel  Daily,   R      02/18/23 0823   02/19/23 0500  CBC with Differential/Platelet  Daily,   R      02/18/23 0823          Data Reviewed: I have personally reviewed following labs and imaging studies CBC: Recent Labs  Lab 02/14/23 1027 02/17/23 1126 02/18/23 0506 02/19/23 0840 02/20/23 0851  WBC 11.9* 82.2* 66.8* 38.5* 13.6*  NEUTROABS 7.5  --   --  33.3* 11.6*  HGB 11.8* 11.3* 11.1* 11.8* 11.7*  HCT 35.0* 33.6* 33.4* 35.9*  36.5  MCV 85.6 87.7 86.8 87.3 89.7  PLT 180 173 168 211 231   Basic Metabolic Panel: Recent Labs  Lab 02/14/23 1027 02/17/23 1126 02/17/23 1629 02/18/23 0506 02/19/23 0840 02/20/23 0851  NA 141 137  --  137 136 136  K 3.4* 3.0*  --  3.6 3.7 3.6  CL 107 102  --  102 101 103  CO2 26 26  --  24 22 21*  GLUCOSE 113* 94  --  118* 136* 113*  BUN 8 11  --  9 10 8   CREATININE 0.77 0.72  --  0.76 0.79 0.68  CALCIUM 9.1 8.9  --  9.2 9.1 9.1  MG  --   --  1.9  --   --   --   PHOS  --   --  2.9  --   --   --    GFR: Estimated Creatinine Clearance: 106.9 mL/min (by C-G formula based on SCr of 0.68 mg/dL). Liver Function Tests: Recent Labs  Lab 02/14/23 1027 02/17/23 1126 02/18/23 0506  AST 23 15 14*  ALT 33 23 19  ALKPHOS 134* 119 134*  BILITOT 0.3 0.6 0.4  PROT 6.9 6.8 6.8  ALBUMIN 4.1 3.6 3.6   Recent Labs    02/19/23 0840  TSH 1.076  FREET4 0.72   Sepsis Labs: Recent Labs  Lab 02/17/23 1126 02/17/23 1629 02/17/23 1900 02/18/23 0506 02/18/23 0848 02/18/23 1713  PROCALCITON  --  0.37  --  0.32  --   --   LATICACIDVEN 2.3*  --  3.9*  --  3.3* 1.5    Recent Results  (from the past 240 hour(s))  SARS CORONAVIRUS 2 (TAT 6-24 HRS) Anterior Nasal Swab     Status: Abnormal   Collection Time: 02/16/23  8:32 AM   Specimen: Anterior Nasal Swab  Result Value Ref Range Status   SARS Coronavirus 2 POSITIVE (A) NEGATIVE Final    Comment: (NOTE) SARS-CoV-2 target nucleic acids are DETECTED.  The SARS-CoV-2 RNA is generally detectable in upper and lower respiratory specimens during the acute phase of infection. Positive results are indicative of the presence of SARS-CoV-2 RNA. Clinical correlation with patient history and other diagnostic information is  necessary to determine patient infection status. Positive results do not rule out bacterial infection or co-infection with other viruses.  The expected result is Negative.  Fact Sheet for Patients: HairSlick.no  Fact Sheet for Healthcare Providers: quierodirigir.com  This test is not yet approved or cleared by the [place] FDA and  has been authorized for detection and/or diagnosis of SARS-CoV-2 by FDA under an Emergency Use Authorization (EUA). This EUA will remain  in effect (meaning this test can be used) for the duration of the COVID-19 declaration under Section 564(b)(1) of the Act, 21 U. S.C. section 360bbb-3(b)(1), unless the authorization is terminated or revoked sooner.   Performed at [facility], [address] [place], [place] [address]   Blood culture (routine x 2)     Status: None (Preliminary result)   Collection Time: 02/17/23 11:26 AM   Specimen: Site Not Specified; Blood  Result Value Ref Range Status   Specimen Description   Final    SITE NOT SPECIFIED Performed at [facility], [address], [place], [place] [address]    Special Requests   Final    BOTTLES DRAWN AEROBIC AND ANAEROBIC Blood Culture results may not be optimal due to an excessive volume of blood received in  culture  bottles Performed at [facility], [address] [place], [place] [address]    Culture   Final    NO GROWTH 3 DAYS Performed at [facility], [address] [place], [place] [address]    Report Status PENDING  Incomplete  Blood culture (routine x 2)     Status: None (Preliminary result)   Collection Time: 02/17/23 11:26 AM   Specimen: Site Not Specified; Blood  Result Value Ref Range Status   Specimen Description   Final    SITE NOT SPECIFIED Performed at [facility], [address], [place], [place] [address]    Special Requests   Final    BOTTLES DRAWN AEROBIC AND ANAEROBIC Blood Culture results may not be optimal due to an excessive volume of blood received in culture bottles Performed at [facility], [address], [place], [place] [address]    Culture   Final    NO GROWTH 3 DAYS Performed at [facility], [address], [place], [place] [address]    Report Status PENDING  Incomplete  Urine Culture (for pregnant, neutropenic or urologic patients or patients with an indwelling urinary catheter)     Status: Abnormal   Collection Time: 02/17/23  2:37 PM   Specimen: Urine, Clean Catch  Result Value Ref Range Status   Specimen Description   Final    URINE, CLEAN CATCH Performed at [facility], [address] [place], [place] [address]    Special Requests   Final    NONE Performed at [facility], [address], [place], [place] [address]    Culture (A)  Final    <10,000 COLONIES/mL INSIGNIFICANT GROWTH Performed at [facility], [address] [place], [place] [address]    Report Status 02/19/2023 FINAL  Final    Antimicrobials: Anti-infectives (From admission, onward)    Start     Dose/Rate Route Frequency Ordered Stop   02/17/23 1800  nirmatrelvir/ritonavir (PAXLOVID) 3 tablet        3 tablet Oral 2 times daily 02/17/23 1527 02/22/23 2159    02/17/23 1315  cefTRIAXone (ROCEPHIN) 2 g in sodium chloride 0.9 % 100 mL IVPB        2 g 200 mL/hr over 30 Minutes Intravenous Every 24 hours 02/17/23 1309 02/22/23 1314   02/17/23 1315  azithromycin (ZITHROMAX) 500 mg in sodium chloride 0.9 % 250 mL IVPB  Status:  Discontinued        500 mg 250 mL/hr over 60 Minutes Intravenous Every 24 hours 02/17/23 1309 02/19/23 0833      Culture/Microbiology    Component Value Date/Time   SDES  02/17/2023 1437    URINE, CLEAN CATCH Performed at [facility], [address], [place], [place] [address]    SPECREQUEST  02/17/2023 1437    NONE Performed at [facility], [address], [place], [place] [address]    CULT (A) 02/17/2023 1437    <10,000 COLONIES/mL INSIGNIFICANT GROWTH Performed at [facility], [address], [place], [place] [address]    REPTSTATUS 02/19/2023 FINAL 02/17/2023 1437     Radiology Studies: VAS [place] LOWER EXTREMITY VENOUS (DVT)  Result Date: 02/20/2023  Lower Venous DVT Study Patient Name:  MIMA EBBS  Date of Exam:   02/20/2023 Medical Rec #: [id number]          Accession #:  [id number] Date of Birth: Nov 11, 1980         Patient Gender: F Patient Age:   71 years Exam Location:  [facility] Procedure:      VAS [place] LOWER EXTREMITY VENOUS (DVT) Referring Phys: Raynisha Avilla --------------------------------------------------------------------------------  Indications: Pain.  Risk Factors: COVID 19 positive. Limitations: Poor ultrasound/tissue interface. Comparison Study: No prior studies. Performing Technologist: Chanda Busing RVT  Examination Guidelines: A complete evaluation includes B-mode imaging, spectral Doppler, color Doppler, and power Doppler as needed of all accessible portions of each vessel. Bilateral testing is considered an integral part of a complete examination. Limited examinations for reoccurring indications may be performed as noted. The reflux portion  of the exam is performed with the patient in reverse Trendelenburg.  +-----+---------------+---------+-----------+----------+--------------+ RIGHTCompressibilityPhasicitySpontaneityPropertiesThrombus Aging +-----+---------------+---------+-----------+----------+--------------+ CFV  Full           Yes      Yes                                 +-----+---------------+---------+-----------+----------+--------------+   +---------+---------------+---------+-----------+----------+--------------+ LEFT     CompressibilityPhasicitySpontaneityPropertiesThrombus Aging +---------+---------------+---------+-----------+----------+--------------+ CFV      Full           Yes      Yes                                 +---------+---------------+---------+-----------+----------+--------------+ SFJ      Full                                                        +---------+---------------+---------+-----------+----------+--------------+ FV Prox  Full                                                        +---------+---------------+---------+-----------+----------+--------------+ FV Mid   Full                                                        +---------+---------------+---------+-----------+----------+--------------+ FV DistalFull                                                        +---------+---------------+---------+-----------+----------+--------------+ PFV      Full                                                        +---------+---------------+---------+-----------+----------+--------------+ POP      Full           Yes      Yes                                 +---------+---------------+---------+-----------+----------+--------------+  PTV      Full                                                        +---------+---------------+---------+-----------+----------+--------------+ PERO     Full                                                         +---------+---------------+---------+-----------+----------+--------------+    Summary: RIGHT: - No evidence of common femoral vein obstruction.   LEFT: - There is no evidence of deep vein thrombosis in the lower extremity.  - No cystic structure found in the popliteal fossa.  *See table(s) above for measurements and observations.    Preliminary    DG Chest Port 1 View  Result Date: 02/19/2023 CLINICAL DATA:  Cough and shortness of breath. COVID positive. Chest pain. EXAM: PORTABLE CHEST 1 VIEW COMPARISON:  Chest CTA 02/17/2023.  Chest x-ray 02/16/2023. FINDINGS: The lungs are clear without focal pneumonia, edema, pneumothorax or pleural effusion. The cardiopericardial silhouette is within normal limits for size. Left Port-A-Cath again noted. Telemetry leads overlie the chest. IMPRESSION: No active disease. Electronically Signed   By: Kennith Center M.D.   On: 02/19/2023 11:14     LOS: 3 days   Lanae Boast, MD [facility]  02/20/2023, 11:06 AM

## 2023-02-20 NOTE — Progress Notes (Signed)
HEMATOLOGY-ONCOLOGY PROGRESS NOTE  SUBJECTIVE: Patient has been admitted in the hospital with COVID.  She received cycle 4 of Adriamycin and Cytoxan and had profound adverse effects from it including body aches headaches fevers fatigue and malaise.  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, ER 30% weak, PR 2%   10/11/2022 Cancer Staging   Staging form: Breast, AJCC 8th Edition - Clinical: Stage 0 (cTis (DCIS), cN0, cM0, G3, ER+, PR+, HER2: Not Assessed) - Signed by Serena Croissant, MD on 10/11/2022 Stage prefix: Initial diagnosis Histologic grading system: 3 grade system   10/11/2022 Genetic Testing   Declined hereditary cancer genetic testing    11/13/2022 Surgery   Right lumpectomy: Grade 3 IDC, 2 cm in size inferior and posterior margins are positive, DCIS posterior margin positive, lymphovascular invasion not identified, ER 0%, PR 0%, Ki-67 85%, HER2 0 by [facility]   12/01/2022 Surgery   Margin excision: Posterior margin: Scattered foci of high-grade DCIS, 0/2 lymph nodes negative   01/01/2023 -  Chemotherapy   Patient is on Treatment Plan : BREAST ADJUVANT DOSE DENSE AC q14d / PACLitaxel q7d       OBJECTIVE: REVIEW OF SYSTEMS:   Constitutional: Denies fevers, chills or abnormal weight loss      All other systems were reviewed with the patient and are negative.     PHYSICAL EXAMINATION: ECOG PERFORMANCE STATUS: 2 - Symptomatic, <50% confined to bed  Vitals:   02/20/23 1024 02/20/23 1409  BP:  123/76  Pulse:  99  Resp: 20 16  Temp:  98.3 F (36.8 C)  SpO2:  100%   Filed Weights   02/17/23 1041  Weight: 211 lb (95.7 kg)      LABORATORY DATA:  I have reviewed the data as listed    Latest Ref Rng & Units 02/20/2023     8:51 AM 02/19/2023    8:40 AM 02/18/2023    5:06 AM  CMP  Glucose 70 - 99 mg/dL 259  563  875   BUN 6 - 20 mg/dL 8  10  9    Creatinine 0.44 - 1.00 mg/dL 6.43  3.29  5.18   Sodium 135 - 145 mmol/L 136  136  137   Potassium 3.5 - 5.1 mmol/L 3.6  3.7  3.6   Chloride 98 - 111 mmol/L 103  101  102   CO2 22 - 32 mmol/L 21  22  24    Calcium 8.9 - 10.3 mg/dL 9.1  9.1  9.2   Total Protein 6.5 - 8.1 g/dL   6.8   Total Bilirubin 0.3 - 1.2 mg/dL   0.4   Alkaline Phos 38 - 126 U/L   134   AST 15 - 41 U/L   14   ALT 0 - 44 U/L   19     Lab Results  Component Value Date   WBC 13.6 (H) 02/20/2023   HGB 11.7 (L) 02/20/2023   HCT 36.5 02/20/2023   MCV 89.7 02/20/2023   PLT 231 02/20/2023   NEUTROABS 11.6 (H) 02/20/2023    ASSESSMENT AND PLAN: 1.  Right breast cancer treated with lumpectomy on 11/13/2022: Grade 3 IDC 2 cm triple negative Ki-67 85% status post adjuvant chemotherapy with cycle 4 of Adriamycin and Cytoxan given on 02/14/2023 with Neulasta support.  Profound adverse effects to chemotherapy: Fortunately Adriamycin and Cytoxan is now complete.  We will have to watch her very closely with her next cycle of chemotherapy which will be with Taxol and carboplatin.  2. COVID infection without pneumonia I will be seeing her on 02/28/2023 for her next chemotherapy and follow-up.

## 2023-02-20 NOTE — Plan of Care (Signed)
  Problem: Education: Goal: Knowledge of risk factors and measures for prevention of condition will improve Outcome: Progressing   Problem: Coping: Goal: Psychosocial and spiritual needs will be supported Outcome: Progressing   Problem: Respiratory: Goal: Will maintain a patent airway Outcome: Progressing Goal: Complications related to the disease process, condition or treatment will be avoided or minimized Outcome: Progressing   Problem: Education: Goal: Knowledge of General Education information will improve Description: Including pain rating scale, medication(s)/side effects and non-pharmacologic comfort measures Outcome: Progressing   Problem: Health Behavior/Discharge Planning: Goal: Ability to manage health-related needs will improve Outcome: Progressing   Problem: Clinical Measurements: Goal: Ability to maintain clinical measurements within normal limits will improve Outcome: Progressing Goal: Respiratory complications will improve Outcome: Progressing   Problem: Activity: Goal: Risk for activity intolerance will decrease Outcome: Progressing   Problem: Nutrition: Goal: Adequate nutrition will be maintained Outcome: Progressing   Problem: Coping: Goal: Level of anxiety will decrease Outcome: Progressing   Problem: Elimination: Goal: Will not experience complications related to bowel motility Outcome: Progressing Goal: Will not experience complications related to urinary retention Outcome: Progressing   Problem: Pain Managment: Goal: General experience of comfort will improve Outcome: Progressing   Problem: Safety: Goal: Ability to remain free from injury will improve Outcome: Progressing   Problem: Skin Integrity: Goal: Risk for impaired skin integrity will decrease Outcome: Progressing

## 2023-02-20 NOTE — Progress Notes (Signed)
Left lower extremity venous duplex has been completed. Preliminary results can be found in CV Proc through chart review.   02/20/23 10:43 AM Olen Cordial RVT

## 2023-02-21 DIAGNOSIS — U071 COVID-19: Secondary | ICD-10-CM

## 2023-02-21 DIAGNOSIS — A419 Sepsis, unspecified organism: Secondary | ICD-10-CM | POA: Diagnosis not present

## 2023-02-21 DIAGNOSIS — C50411 Malignant neoplasm of upper-outer quadrant of right female breast: Secondary | ICD-10-CM | POA: Diagnosis not present

## 2023-02-21 DIAGNOSIS — Z171 Estrogen receptor negative status [ER-]: Secondary | ICD-10-CM | POA: Diagnosis not present

## 2023-02-21 LAB — BASIC METABOLIC PANEL
Anion gap: 9 (ref 5–15)
BUN: 8 mg/dL (ref 6–20)
CO2: 25 mmol/L (ref 22–32)
Calcium: 9 mg/dL (ref 8.9–10.3)
Chloride: 99 mmol/L (ref 98–111)
Creatinine, Ser: 0.69 mg/dL (ref 0.44–1.00)
GFR, Estimated: >60 mL/min (ref >60)
Glucose, Bld: 110 mg/dL — ABNORMAL HIGH (ref 70–99)
Potassium: 4.1 mmol/L (ref 3.5–5.1)
Sodium: 133 mmol/L — ABNORMAL LOW (ref 135–145)

## 2023-02-21 LAB — CBC WITH DIFFERENTIAL/PLATELET
Abs Immature Granulocytes: 0.18 10*3/uL — ABNORMAL HIGH (ref 0.00–0.07)
Basophils Absolute: 0.1 10*3/uL (ref 0.0–0.1)
Basophils Relative: 1 %
Eosinophils Absolute: 0.1 10*3/uL (ref 0.0–0.5)
Eosinophils Relative: 1 %
HCT: 34.3 % — ABNORMAL LOW (ref 36.0–46.0)
Hemoglobin: 11.2 g/dL — ABNORMAL LOW (ref 12.0–15.0)
Immature Granulocytes: 2 %
Lymphocytes Relative: 17 %
Lymphs Abs: 1.3 10*3/uL (ref 0.7–4.0)
MCH: 28.9 pg (ref 26.0–34.0)
MCHC: 32.7 g/dL (ref 30.0–36.0)
MCV: 88.4 fL (ref 80.0–100.0)
Monocytes Absolute: 0.5 10*3/uL (ref 0.1–1.0)
Monocytes Relative: 7 %
Neutro Abs: 5.5 10*3/uL (ref 1.7–7.7)
Neutrophils Relative %: 72 %
Platelets: 228 10*3/uL (ref 150–400)
RBC: 3.88 MIL/uL (ref 3.87–5.11)
RDW: 16.2 % — ABNORMAL HIGH (ref 11.5–15.5)
WBC: 7.6 10*3/uL (ref 4.0–10.5)
nRBC: 0 % (ref 0.0–0.2)

## 2023-02-21 MED ORDER — FLUCONAZOLE 150 MG PO TABS: 150.0000 mg | ORAL_TABLET | ORAL | 0 refills | Status: DC | PRN

## 2023-02-21 MED ORDER — NIRMATRELVIR/RITONAVIR (PAXLOVID)TABLET: 3.0000 | ORAL_TABLET | Freq: Two times a day (BID) | ORAL | Status: AC

## 2023-02-21 MED ORDER — BENZONATATE 200 MG PO CAPS: 200.0000 mg | ORAL_CAPSULE | Freq: Three times a day (TID) | ORAL | 0 refills | Status: DC | PRN

## 2023-02-21 MED ORDER — FLUCONAZOLE 150 MG PO TABS
150.0000 mg | ORAL_TABLET | Freq: Once | ORAL | Status: AC
  Administered 2023-02-21: 150 mg via ORAL
  Filled 2023-02-21: qty 1

## 2023-02-21 MED ORDER — HYDROCODONE BIT-HOMATROP MBR 5-1.5 MG/5ML PO SOLN: 5.0000 mL | Freq: Four times a day (QID) | ORAL | 0 refills | Status: DC | PRN

## 2023-02-21 MED ORDER — ESOMEPRAZOLE MAGNESIUM 40 MG PO CPDR: 40.0000 mg | DELAYED_RELEASE_CAPSULE | Freq: Two times a day (BID) | ORAL | 3 refills | Status: DC

## 2023-02-21 MED ORDER — ONDANSETRON HCL 4 MG/2ML IJ SOLN: 4.0000 mg | Freq: Four times a day (QID) | INTRAMUSCULAR | 0 refills | Status: DC | PRN

## 2023-02-21 MED ORDER — ONDANSETRON 4 MG PO TBDP: 4.0000 mg | ORAL_TABLET | Freq: Three times a day (TID) | ORAL | 0 refills | Status: DC | PRN

## 2023-02-21 MED ORDER — ONDANSETRON HCL 8 MG PO TABS: ORAL_TABLET | ORAL | 1 refills | Status: DC

## 2023-02-21 MED ORDER — NYSTATIN 100000 UNIT/ML MT SUSP: 5.0000 mL | Freq: Four times a day (QID) | OROMUCOSAL | 0 refills | Status: AC

## 2023-02-21 MED ORDER — POLYETHYLENE GLYCOL 3350 17 G PO PACK: 17.0000 g | PACK | Freq: Every day | ORAL | 0 refills | Status: DC | PRN

## 2023-02-21 NOTE — TOC Transition Note (Signed)
Transition of Care [facility]) - CM/SW Discharge Note   Patient Details  Name: WALTER GREIG MRN: [id number] Date of Birth: 05/02/1981  [facility] CM/SW Contact:  Lanier Clam, RN Phone Number: 02/21/2023, 1:43 PM   Clinical Narrative: d/c home No CM needs.      Final next level of care: Home/Self Care Barriers to Discharge: Continued Medical Work up   Patient Goals and CMS Choice CMS Medicare.gov Compare Post Acute Care list provided to:: Patient    Discharge Placement                         Discharge Plan and Services Additional resources added to the After Visit Summary for     Discharge Planning Services: CM Consult                                 Social Determinants of Health (SDOH) Interventions SDOH Screenings   Food Insecurity: No Food Insecurity (02/17/2023)  Housing: Low Risk  (02/17/2023)  Transportation Needs: No Transportation Needs (02/17/2023)  Utilities: Not At Risk (02/17/2023)  Alcohol Screen: Low Risk  (11/03/2021)  Depression (PHQ2-9): Medium Risk (11/29/2022)  Financial Resource Strain: Medium Risk (11/03/2021)  Physical Activity: Sufficiently Active (11/03/2021)  Social Connections: Moderately Isolated (11/03/2021)  Stress: Stress Concern Present (11/03/2021)  Tobacco Use: Low Risk  (02/17/2023)  Recent Concern: Tobacco Use - Medium Risk (02/15/2023)   Received from [facility]     Readmission Risk Interventions     No data to display

## 2023-02-21 NOTE — Discharge Summary (Addendum)
Physician Discharge Summary   Patient: Laurie Bell MRN: [id number] DOB: [birth year]  Admit date:     02/17/2023  Discharge date: 02/21/23  Discharge Physician: Thad Ranger, MD    PCP: Hoy Register, MD   Recommendations at discharge:   Patient will complete rest of the Paxlovid course outpatient at home  Discharge Diagnoses:    Sepsis (HCC)     COVID-19 virus infection   Hypokalemia   Gastroesophageal reflux disease   Hyperthyroidism   Malignant neoplasm of upper-outer quadrant of right breast in female, estrogen receptor negative (HCC) Lactic acidosis   Hospital Course:  Patient is a 42 year old female with anxiety, multinodular goiter, hypomagnesemia, hypokalemia, hypocalcemia, hypothyroidism fibroids, history of breast CA on chemo, last treatment 3 days PTA, recently exposed to COVID presented with dyspnea, cough, headache, body aches, fatigue and malaise UA hazy with small hemoglobin, large leukocyte esterase.  Temp 98.3 F, pulse 110, RR 20, O2 sats 100% on room air CBC showed WBCs of 52.2, hemoglobin 11.3, lactic acid 2.3 COVID-positive CTA chest showed no PE, unchanged 4 mm right middle lobe nodule, no acute abnormality  Assessment and Plan:  Possible sepsis POA-with lactic acidosis/leukocytosis-likely UTI COVID-19 infection: -Procalcitonin stable, overall improving, no hypoxia, afebrile -CTA chest showed no PE, pleural effusion or pneumothorax.  Unchanged 4 mm right middle lobe nodule, no acute abnormality -Patient was placed on symptomatic treatment, gentle IV fluid hydration, antitussives, Paxlovid -Feels close to her baseline status, will complete Paxlovid course at home WBC count improved to 7.6 this morning -Concern for UTI however urine culture showed less than 10,000 colonies of insignificant growth, patient has completed 5 days of IV Rocephin today, also received 2 days of IV Zithromax on admission    Lactic acidosis: Suspect multifactorial in the  setting of dehydration chemotherapy. -resolved with aggressive IV fluid hydration   Breast cancer on chemotherapy: Last chemo earlier in the week does have significant leukocytosis.  Dr. Pamelia Hoit has been informed for consult.  Patient reports significant weakness and "chemo hit hard"  Recent admission on 8/12 for chemo associated mucositis/oral candidiasis. -Outpatient follow-up with Dr. Pamelia Hoit   Leukocytosis: On admission 82K, likely in the setting of pegfilgrastim on 02/16/23 and COVID illness WBC 7.6 upon discharge   Hyperthyroidism: Appears euthyroid based on normal TSH/free T4 continue methimazole, Toprol. S   Sinus tachycardia: In the setting of post chemo, dehydration UTI, COVID-19.  TSH 0.5 Continue Toprol-XL   Hypokalemia: Resolved   Left calf pain: Negative for DVT and duplex.  Continue symptomatic management   GERD: Continue PPI   Oral thrush: Continue nystatin   Class I Obesity:Patient's Body mass index is 34.06 kg/m. : Will benefit with PCP follow-up, weight loss  healthy lifestyle       Pain control - [place] Controlled Substance Reporting System database was reviewed. and patient was instructed, not to drive, operate heavy machinery, perform activities at heights, swimming or participation in water activities or provide baby-sitting services while on Pain, Sleep and Anxiety Medications; until their outpatient Physician has advised to do so again. Also recommended to not to take more than prescribed Pain, Sleep and Anxiety Medications.  Consultants: Oncology, Dr. Pamelia Hoit Procedures performed: None Disposition: Home Diet recommendation:  Discharge Diet Orders (From admission, onward)     Start     Ordered   02/21/23 0000  Diet - low sodium heart healthy        02/21/23 1315           DISCHARGE  MEDICATION: Allergies as of 02/21/2023       Reactions   Latex Other (See Comments)   Burn in vaginal area with latex condoms   Tramadol Other (See  Comments)   States "it messes me up" no further info        Medication List     STOP taking these medications    pantoprazole 40 MG tablet Commonly known as: PROTONIX   potassium chloride 10 MEQ tablet Commonly known as: KLOR-CON       TAKE these medications    acetaminophen 500 MG tablet Commonly known as: TYLENOL Take 1,000 mg by mouth every 6 (six) hours as needed for moderate pain.   albuterol 108 (90 Base) MCG/ACT inhaler Commonly known as: VENTOLIN HFA TAKE 2 PUFFS BY MOUTH EVERY 6 HOURS AS NEEDED FOR WHEEZE OR SHORTNESS OF BREATH What changed: See the new instructions.   benzonatate 200 MG capsule Commonly known as: TESSALON Take 1 capsule (200 mg total) by mouth 3 (three) times daily as needed for cough.   cyclobenzaprine 10 MG tablet Commonly known as: FLEXERIL TAKE 1 TABLET BY MOUTH AT BEDTIME AS NEEDED FOR MUSCLE SPASMS What changed: See the new instructions.   dexamethasone 4 MG tablet Commonly known as: DECADRON Take 1 tablet day after chemo and 1 tablet 2 days after chemo with food   esomeprazole 40 MG capsule Commonly known as: NEXIUM Take 1 capsule (40 mg total) by mouth 2 (two) times daily before a meal. What changed: when to take this   gabapentin 400 MG capsule Commonly known as: NEURONTIN Take 400 mg by mouth 3 (three) times daily as needed (pain).   HYDROcodone bit-homatropine 5-1.5 MG/5ML syrup Commonly known as: HYCODAN Take 5 mLs by mouth every 6 (six) hours as needed for cough.   hydrocortisone 2.5 % rectal cream Commonly known as: ANUSOL-HC Place rectally 4 (four) times daily as needed for hemorrhoids or anal itching.   hydrOXYzine 25 MG tablet Commonly known as: ATARAX TAKE 1 TABLET(25 MG) BY MOUTH THREE TIMES DAILY   lidocaine 2 % solution Commonly known as: XYLOCAINE Use as directed 15 mLs in the mouth or throat every 4 (four) hours as needed for mouth pain.   linaclotide 145 MCG Caps capsule Commonly known as:  Linzess Take 1 capsule (145 mcg total) by mouth daily before breakfast.   methimazole 5 MG tablet Commonly known as: TAPAZOLE Take 1 tablet (5 mg total) by mouth as directed. 1 tablet Monday through Friday, skip Saturdays and  Sundays   metoprolol succinate 25 MG 24 hr tablet Commonly known as: TOPROL-XL Take 0.5 tablets (12.5 mg total) by mouth daily. For increased heart rate   naproxen 500 MG tablet Commonly known as: NAPROSYN TAKE 1 TABLET BY MOUTH TWICE A DAY AS NEEDED FOR PAIN   nirmatrelvir/ritonavir 20 x 150 MG & 10 x 100MG  Tabs Commonly known as: PAXLOVID Take 3 tablets by mouth 2 (two) times daily for 5 days. GFR is >60. Take 300 mg nirmatrelvir (two 150 mg tablets) with 100 mg ritonavir (one 100 mg tablet), with all three tablets taken together twice daily for 5 days. Dispensed as dose pack   nystatin 100000 UNIT/ML suspension Commonly known as: MYCOSTATIN Take 5 mLs (500,000 Units total) by mouth 4 (four) times daily for 7 days.   ondansetron 8 MG tablet Commonly known as: Zofran Take 1 tab (8 mg) by mouth every 8 hrs as needed for nausea/vomiting. Start third day after doxorubicin/cyclophosphamide chemotherapy.  prochlorperazine 10 MG tablet Commonly known as: COMPAZINE Take 1 tablet (10 mg total) by mouth every 6 (six) hours as needed for nausea or vomiting.   zolpidem 5 MG tablet Commonly known as: AMBIEN Take 1 tablet (5 mg total) by mouth at bedtime as needed for sleep.        Follow-up Information     Hoy Register, MD. Schedule an appointment as soon as possible for a visit in 2 week(s).   Specialty: Family Medicine Why: for hospital follow-up Contact information: [address] [place] [address] [place] [address] [phone]                Discharge Exam: Ceasar Mons Weights   02/17/23 1041  Weight: 95.7 kg   S: Feels a lot better today, wants to go home.  No fevers or chills, cough better.  Overall improving.  BP 130/80 (BP Location:  Left Arm)   Pulse (!) 109   Temp 98.6 F (37 C) (Oral)   Resp 18   Ht 5\' 6"  (1.676 m)   Wt 95.7 kg   SpO2 100%   BMI 34.06 kg/m   Physical Exam General: Alert and oriented x 3, NAD Cardiovascular: S1 S2 clear, RRR.  Respiratory: CTAB, no wheezing Gastrointestinal: Soft, nontender, nondistended, NBS Ext: no pedal edema bilaterally Neuro: no new deficits Psych: Normal affect    Condition at discharge: fair  The results of significant diagnostics from this hospitalization (including imaging, microbiology, ancillary and laboratory) are listed below for reference.   Imaging Studies: VAS [place] LOWER EXTREMITY VENOUS (DVT)  Result Date: 02/20/2023  Lower Venous DVT Study Patient Name:  DENIYAH PATCHIN  Date of Exam:   02/20/2023 Medical Rec #: [id number]          Accession #:    [id number] Date of Birth: 03-19-1981         Patient Gender: F Patient Age:   30 years Exam Location:  [facility] Procedure:      VAS [place] LOWER EXTREMITY VENOUS (DVT) Referring Phys: RAMESH KC --------------------------------------------------------------------------------  Indications: Pain.  Risk Factors: COVID 19 positive. Limitations: Poor ultrasound/tissue interface. Comparison Study: No prior studies. Performing Technologist: Chanda Busing RVT  Examination Guidelines: A complete evaluation includes B-mode imaging, spectral Doppler, color Doppler, and power Doppler as needed of all accessible portions of each vessel. Bilateral testing is considered an integral part of a complete examination. Limited examinations for reoccurring indications may be performed as noted. The reflux portion of the exam is performed with the patient in reverse Trendelenburg.  +-----+---------------+---------+-----------+----------+--------------+ RIGHTCompressibilityPhasicitySpontaneityPropertiesThrombus Aging +-----+---------------+---------+-----------+----------+--------------+ CFV  Full           Yes      Yes                                  +-----+---------------+---------+-----------+----------+--------------+   +---------+---------------+---------+-----------+----------+--------------+ LEFT     CompressibilityPhasicitySpontaneityPropertiesThrombus Aging +---------+---------------+---------+-----------+----------+--------------+ CFV      Full           Yes      Yes                                 +---------+---------------+---------+-----------+----------+--------------+ SFJ      Full                                                        +---------+---------------+---------+-----------+----------+--------------+  FV Prox  Full                                                        +---------+---------------+---------+-----------+----------+--------------+ FV Mid   Full                                                        +---------+---------------+---------+-----------+----------+--------------+ FV DistalFull                                                        +---------+---------------+---------+-----------+----------+--------------+ PFV      Full                                                        +---------+---------------+---------+-----------+----------+--------------+ POP      Full           Yes      Yes                                 +---------+---------------+---------+-----------+----------+--------------+ PTV      Full                                                        +---------+---------------+---------+-----------+----------+--------------+ PERO     Full                                                        +---------+---------------+---------+-----------+----------+--------------+    Summary: RIGHT: - No evidence of common femoral vein obstruction.   LEFT: - There is no evidence of deep vein thrombosis in the lower extremity.  - No cystic structure found in the popliteal fossa.  *See table(s) above for measurements and  observations. Electronically signed by Lemar Livings MD on 02/20/2023 at 5:41:57 PM.    Final    DG Chest Port 1 View  Result Date: 02/19/2023 CLINICAL DATA:  Cough and shortness of breath. COVID positive. Chest pain. EXAM: PORTABLE CHEST 1 VIEW COMPARISON:  Chest CTA 02/17/2023.  Chest x-ray 02/16/2023. FINDINGS: The lungs are clear without focal pneumonia, edema, pneumothorax or pleural effusion. The cardiopericardial silhouette is within normal limits for size. Left Port-A-Cath again noted. Telemetry leads overlie the chest. IMPRESSION: No active disease. Electronically Signed   By: Kennith Center M.D.   On: 02/19/2023 11:14   CT Angio Chest PE W/Cm &/Or Wo Cm  Result Date: 02/17/2023 CLINICAL DATA:  Shortness of breath. History of breast cancer. Productive cough.  EXAM: CT ANGIOGRAPHY CHEST WITH CONTRAST TECHNIQUE: Multidetector CT imaging of the chest was performed using the standard protocol during bolus administration of intravenous contrast. Multiplanar CT image reconstructions and MIPs were obtained to evaluate the vascular anatomy. RADIATION DOSE REDUCTION: This exam was performed according to the departmental dose-optimization program which includes automated exposure control, adjustment of the mA and/or kV according to patient size and/or use of iterative reconstruction technique. CONTRAST:  75mL OMNIPAQUE IOHEXOL 350 MG/ML SOLN COMPARISON:  January 21, 2023.  July 24, 2015. FINDINGS: Cardiovascular: Satisfactory opacification of the pulmonary arteries to the segmental level. No evidence of pulmonary embolism. Normal heart size. No pericardial effusion. Mediastinum/Nodes: No enlarged mediastinal, hilar, or axillary lymph nodes. Thyroid gland, trachea, and esophagus demonstrate no significant findings. Lungs/Pleura: 4 mm right middle lobe nodule is noted on image number 66 of series 12 which is unchanged compared to prior exam of 2017. No acute abnormality seen. No pleural effusion or pneumothorax.  Upper Abdomen: No acute abnormality. Musculoskeletal: No chest wall abnormality. No acute or significant osseous findings. Review of the MIP images confirms the above findings. IMPRESSION: No definite evidence of pulmonary embolus. Electronically Signed   By: Lupita Raider M.D.   On: 02/17/2023 13:52   DG Chest 2 View  Result Date: 02/16/2023 CLINICAL DATA:  cough, immunocompromised EXAM: CHEST - 2 VIEW COMPARISON:  01/21/2023 FINDINGS: Cardiomediastinal silhouette and pulmonary vasculature are within normal limits. Left chest port is unchanged in position. Right breast clips are again noted. No airspace opacity to indicate pneumonia.  Lungs are clear. IMPRESSION: No acute cardiopulmonary process. Electronically Signed   By: Acquanetta Belling M.D.   On: 02/16/2023 10:09    Microbiology: Results for orders placed or performed during the hospital encounter of 02/17/23  Blood culture (routine x 2)     Status: None (Preliminary result)   Collection Time: 02/17/23 11:26 AM   Specimen: Site Not Specified; Blood  Result Value Ref Range Status   Specimen Description   Final    SITE NOT SPECIFIED Performed at [facility], [address], [place], [place] [address]    Special Requests   Final    BOTTLES DRAWN AEROBIC AND ANAEROBIC Blood Culture results may not be optimal due to an excessive volume of blood received in culture bottles Performed at [facility], [address], [place], [place] [address]    Culture   Final    NO GROWTH 4 DAYS Performed at [facility], [address] [place], [place] [address]    Report Status PENDING  Incomplete  Blood culture (routine x 2)     Status: None (Preliminary result)   Collection Time: 02/17/23 11:26 AM   Specimen: Site Not Specified; Blood  Result Value Ref Range Status   Specimen Description   Final    SITE NOT SPECIFIED Performed at [facility], [address], [place], [place]  [address]    Special Requests   Final    BOTTLES DRAWN AEROBIC AND ANAEROBIC Blood Culture results may not be optimal due to an excessive volume of blood received in culture bottles Performed at [facility], [address] [place], [place] [address]    Culture   Final    NO GROWTH 4 DAYS Performed at [facility], [address], [place], [place] [address]    Report Status PENDING  Incomplete  Urine Culture (for pregnant, neutropenic or urologic patients or patients with  an indwelling urinary catheter)     Status: Abnormal   Collection Time: 02/17/23  2:37 PM   Specimen: Urine, Clean Catch  Result Value Ref Range Status   Specimen Description   Final    URINE, CLEAN CATCH Performed at [facility], [address] [place], [place] [address]    Special Requests   Final    NONE Performed at [facility], [address] [place], [place] [address]    Culture (A)  Final    <10,000 COLONIES/mL INSIGNIFICANT GROWTH Performed at [facility], [address] [place], [place] [address]    Report Status 02/19/2023 FINAL  Final    Labs: CBC: Recent Labs  Lab 02/17/23 1126 02/18/23 0506 02/19/23 0840 02/20/23 0851 02/21/23 0823  WBC 82.2* 66.8* 38.5* 13.6* 7.6  NEUTROABS  --   --  33.3* 11.6* 5.5  HGB 11.3* 11.1* 11.8* 11.7* 11.2*  HCT 33.6* 33.4* 35.9* 36.5 34.3*  MCV 87.7 86.8 87.3 89.7 88.4  PLT 173 168 211 231 228   Basic Metabolic Panel: Recent Labs  Lab 02/17/23 1126 02/17/23 1629 02/18/23 0506 02/19/23 0840 02/20/23 0851 02/21/23 0823  NA 137  --  137 136 136 133*  K 3.0*  --  3.6 3.7 3.6 4.1  CL 102  --  102 101 103 99  CO2 26  --  24 22 21* 25  GLUCOSE 94  --  118* 136* 113* 110*  BUN 11  --  9 10 8 8   CREATININE 0.72  --  0.76 0.79 0.68 0.69  CALCIUM 8.9  --  9.2 9.1 9.1 9.0  MG  --  1.9  --   --   --   --   PHOS  --  2.9  --   --   --   --    Liver Function Tests: Recent Labs  Lab  02/17/23 1126 02/18/23 0506  AST 15 14*  ALT 23 19  ALKPHOS 119 134*  BILITOT 0.6 0.4  PROT 6.8 6.8  ALBUMIN 3.6 3.6   CBG: No results for input(s): "GLUCAP" in the last 168 hours.  Discharge time spent: greater than 30 minutes.  Signed: Thad Ranger, MD [facility] 02/21/2023

## 2023-02-21 NOTE — Plan of Care (Signed)
  Problem: Coping: Goal: Psychosocial and spiritual needs will be supported Outcome: Progressing   Problem: Education: Goal: Knowledge of General Education information will improve Description: Including pain rating scale, medication(s)/side effects and non-pharmacologic comfort measures Outcome: Progressing   Problem: Health Behavior/Discharge Planning: Goal: Ability to manage health-related needs will improve Outcome: Progressing   Problem: Clinical Measurements: Goal: Diagnostic test results will improve Outcome: Progressing   Problem: Nutrition: Goal: Adequate nutrition will be maintained Outcome: Progressing   Problem: Elimination: Goal: Will not experience complications related to urinary retention Outcome: Progressing   Problem: Skin Integrity: Goal: Risk for impaired skin integrity will decrease Outcome: Progressing

## 2023-02-22 LAB — CULTURE, BLOOD (ROUTINE X 2)
Culture: NO GROWTH
Culture: NO GROWTH

## 2023-02-23 ENCOUNTER — Telehealth: Payer: Self-pay

## 2023-02-23 NOTE — Transitions of Care (Post Inpatient/ED Visit) (Signed)
02/23/2023  Name: Laurie Bell MRN: [id number] DOB: 1981-01-05  Today's TOC FU Call Status: Today's TOC FU Call Status:: Unsuccessful Call (1st Attempt) Unsuccessful Call (1st Attempt) Date: 02/23/23  Attempted to reach the patient regarding the most recent Inpatient/ED visit.  Follow Up Plan: Additional outreach attempts will be made to reach the patient to complete the Transitions of Care (Post Inpatient/ED visit) call.     Antionette Fairy, RN,BSN,CCM [facility] Care Management Care Management Community Coordinator Direct Phone: [phone] Toll Free: [phone] Fax: [phone]

## 2023-02-23 NOTE — Transitions of Care (Post Inpatient/ED Visit) (Signed)
02/23/2023  Name: Laurie Bell MRN: [id number] DOB: [birth year]  Today's TOC FU Call Status: Today's TOC FU Call Status:: Successful TOC FU Call Completed TOC FU Call Complete Date: 02/23/23 (Voicemail message received from pt. Return call to pt.) Patient's Name and Date of Birth confirmed.  Transition Care Management Follow-up Telephone Call Date of Discharge: 02/21/23 Discharge Facility: [facility]) Type of Discharge: Inpatient Admission Primary Inpatient Discharge Diagnosis:: "COVID" How have you been since you were released from the hospital?: Better (Pt voices she is doing better. States she has completed Paxlovid. She has occassional cough-taking cough syrup-coughing up white sputum. Appetite is better as taste bud coming back-had some nausea yesterday-relieved with med. LBM yest.) Any questions or concerns?: No  Items Reviewed: Did you receive and understand the discharge instructions provided?: Yes Medications obtained,verified, and reconciled?: Yes (Medications Reviewed) (Pt states she is going to pick up Diflucan and Nystatin today from pharmacy) Any new allergies since your discharge?: No Dietary orders reviewed?: Yes Type of Diet Ordered:: low salt/heart healthy Do you have support at home?: Yes People in Home: spouse Name of Support/Comfort Primary Source: Loraine Leriche  Medications Reviewed Today: Medications Reviewed Today     Reviewed by Charlyn Minerva, RN (Registered Nurse) on 02/23/23 at 1216  Med List Status: <None>   Medication Order Taking? Sig Documenting Provider Last Dose Status Informant  acetaminophen (TYLENOL) 500 MG tablet [id number] Yes Take 1,000 mg by mouth every 6 (six) hours as needed for moderate pain. [provider] Taking Active Self, Pharmacy Records  albuterol (VENTOLIN HFA) 108 (90 Base) MCG/ACT inhaler [id number] Yes TAKE 2 PUFFS BY MOUTH EVERY 6 HOURS AS NEEDED FOR WHEEZE OR SHORTNESS OF BREATH  Patient taking  differently: Inhale 2 puffs into the lungs every 6 (six) hours as needed for wheezing or shortness of breath.   Hoy Register, MD Taking Active Self, Pharmacy Records  benzonatate (TESSALON) 200 MG capsule [id number] Yes Take 1 capsule (200 mg total) by mouth 3 (three) times daily as needed for cough. Rai, Delene Ruffini, MD Taking Active   cyclobenzaprine (FLEXERIL) 10 MG tablet [id number] Yes TAKE 1 TABLET BY MOUTH AT BEDTIME AS NEEDED FOR MUSCLE SPASMS  Patient taking differently: Take 10 mg by mouth at bedtime.   Kirsteins, Victorino Sparrow, MD Taking Active Self, Pharmacy Records  dexamethasone (DECADRON) 4 MG tablet [id number] Yes Take 1 tablet day after chemo and 1 tablet 2 days after chemo with food Serena Croissant, MD Taking Active Self, Pharmacy Records  esomeprazole (NEXIUM) 40 MG capsule [id number] Yes Take 1 capsule (40 mg total) by mouth 2 (two) times daily before a meal. Rai, Ripudeep K, MD Taking Active   fluconazole (DIFLUCAN) 150 MG tablet [id number]  Take 1 tablet (150 mg total) by mouth every three (3) days as needed (Yeast infection). Rai, Delene Ruffini, MD  Active   gabapentin (NEURONTIN) 400 MG capsule [id number] Yes Take 400 mg by mouth 3 (three) times daily as needed (pain). [provider] Taking Active Self, Pharmacy Records  HYDROcodone bit-homatropine (HYCODAN) 5-1.5 MG/5ML syrup [id number] Yes Take 5 mLs by mouth every 6 (six) hours as needed for cough. Rai, Delene Ruffini, MD Taking Active   hydrocortisone (ANUSOL-HC) 2.5 % rectal cream [id number] Yes Place rectally 4 (four) times daily as needed for hemorrhoids or anal itching. Rolly Salter, MD Taking Active Self, Pharmacy Records  hydrOXYzine (ATARAX) 25 MG tablet [id number] Yes TAKE 1 TABLET(25 MG) BY MOUTH THREE TIMES DAILY Hoy Register, MD Taking  Active Self, Pharmacy Records  lidocaine (XYLOCAINE) 2 % solution [id number] Yes Use as directed 15 mLs in the mouth or throat every 4 (four) hours as needed for mouth pain. [provider] Taking Active Self, Pharmacy Records  linaclotide [facility]) 145 MCG CAPS capsule [id number] Yes Take 1 capsule (145 mcg total) by mouth daily before breakfast. Imogene Burn, MD Taking Active Self, Pharmacy Records   Patient not taking:   Discontinued 02/02/20 1436 methimazole (TAPAZOLE) 5 MG tablet [id number] Yes Take 1 tablet (5 mg total) by mouth as directed. 1 tablet Monday through Friday, skip Saturdays and  Sundays Shamleffer, Konrad Dolores, MD Taking Active Self, Pharmacy Records  metoprolol succinate (TOPROL-XL) 25 MG 24 hr tablet [id number] Yes Take 0.5 tablets (12.5 mg total) by mouth daily. For increased heart rate Hoy Register, MD Taking Active Self, Pharmacy Records           Med Note (CARD, AMY L   Sat Feb 17, 2023  3:44 PM) Patient never took this medication. 02/17/23  naproxen (NAPROSYN) 500 MG tablet [id number] Yes TAKE 1 TABLET BY MOUTH TWICE A DAY AS NEEDED FOR PAIN Hoy Register, MD Taking Active Self, Pharmacy Records  nirmatrelvir/ritonavir (PAXLOVID) 20 x 150 MG & 10 x 100MG  TABS [id number]  Take 3 tablets by mouth 2 (two) times daily for 5 days. GFR is >60. Take 300 mg nirmatrelvir (two 150 mg tablets) with 100 mg ritonavir (one 100 mg tablet), with all three tablets taken together twice daily for 5 days. Dispensed as dose pack  Patient taking differently: Take 3 tablets by mouth 2 (two) times daily. GFR is >60. Take 300 mg nirmatrelvir (two 150 mg tablets) with 100 mg ritonavir (one 100 mg tablet), with all three tablets taken together twice daily for 5 days. Dispensed as dose pack-pt states she completed on 02/22/23   Rai, Delene Ruffini, MD  Active Self  nystatin (MYCOSTATIN) 100000 UNIT/ML suspension [id number]  Take 5 mLs (500,000 Units total) by mouth 4 (four) times daily for 7 days. Rai, Delene Ruffini, MD  Active   ondansetron (ZOFRAN) 8 MG tablet [id number] Yes Take 1 tab (8 mg) by mouth every 8 hrs as needed for nausea/vomiting. Start third day after  doxorubicin/cyclophosphamide chemotherapy. Rai, Delene Ruffini, MD Taking Active   prochlorperazine (COMPAZINE) 10 MG tablet [id number] Yes Take 1 tablet (10 mg total) by mouth every 6 (six) hours as needed for nausea or vomiting. Serena Croissant, MD Taking Active Self, Pharmacy Records  zolpidem [facility]) 5 MG tablet [id number] Yes Take 1 tablet (5 mg total) by mouth at bedtime as needed for sleep. Serena Croissant, MD Taking Active Self, Pharmacy Records            Home Care and Equipment/Supplies: Were Home Health Services Ordered?: NA Any new equipment or medical supplies ordered?: NA  Functional Questionnaire: Do you need assistance with bathing/showering or dressing?: No Do you need assistance with meal preparation?: No Do you need assistance with eating?: No Do you have difficulty maintaining continence: No Do you need assistance with getting out of bed/getting out of a chair/moving?: No Do you have difficulty managing or taking your medications?: No  Follow up appointments reviewed: PCP Follow-up appointment confirmed?: No (Pt declined-states following up closely & frequently with oncologist) MD Provider Line Number:[phone] Given: No Specialist Hospital Follow-up appointment confirmed?: Yes Date of Specialist follow-up appointment?: 02/28/23 Follow-Up Specialty Provider:: Dr. Pamelia Hoit Do you need transportation to your follow-up appointment?: No (pt confirms spouse takes her to  appts) Do you understand care options if your condition(s) worsen?: Yes-patient verbalized understanding  SDOH Interventions Today    Flowsheet Row Most Recent Value  SDOH Interventions   Food Insecurity Interventions Intervention Not Indicated  Transportation Interventions Intervention Not Indicated      TOC Interventions Today    Flowsheet Row Most Recent Value  TOC Interventions   TOC Interventions Discussed/Reviewed TOC Interventions Discussed      Interventions Today    Flowsheet Row Most  Recent Value  Chronic Disease   Chronic disease during today's visit Other  [Breast CA]  General Interventions   General Interventions Discussed/Reviewed General Interventions Discussed, Doctor Visits, Referral to Nurse  [pt declined-staes she is able to manage her conditions and does not need ongoing calls]  Doctor Visits Discussed/Reviewed Doctor Visits Discussed, PCP, Specialist  PCP/Specialist Visits Compliance with follow-up visit  Education Interventions   Education Provided Provided Education  Provided Verbal Education On When to see the doctor, Nutrition, Medication, Other  [sx mgmt]  Nutrition Interventions   Nutrition Discussed/Reviewed Nutrition Discussed  Pharmacy Interventions   Pharmacy Dicussed/Reviewed Pharmacy Topics Discussed, Medications and their functions  Safety Interventions   Safety Discussed/Reviewed Safety Discussed       [facility] Care Management Care Management Community Coordinator Direct Phone: [phone] Toll Free: [phone] Fax: [phone]

## 2023-02-27 MED FILL — Dexamethasone Sodium Phosphate Inj 100 MG/10ML: INTRAMUSCULAR | Qty: 1 | Status: AC

## 2023-02-28 ENCOUNTER — Inpatient Hospital Stay: Payer: 59

## 2023-02-28 ENCOUNTER — Inpatient Hospital Stay (HOSPITAL_BASED_OUTPATIENT_CLINIC_OR_DEPARTMENT_OTHER): Payer: 59 | Admitting: Hematology and Oncology

## 2023-02-28 ENCOUNTER — Other Ambulatory Visit: Payer: Self-pay | Admitting: *Deleted

## 2023-02-28 VITALS — BP 123/60 | HR 87 | Resp 18

## 2023-02-28 VITALS — BP 128/91 | HR 91 | Temp 97.2°F | Resp 18 | Ht 66.0 in | Wt 213.8 lb

## 2023-02-28 DIAGNOSIS — Z171 Estrogen receptor negative status [ER-]: Secondary | ICD-10-CM

## 2023-02-28 DIAGNOSIS — Z79899 Other long term (current) drug therapy: Secondary | ICD-10-CM | POA: Diagnosis not present

## 2023-02-28 DIAGNOSIS — Z95828 Presence of other vascular implants and grafts: Secondary | ICD-10-CM

## 2023-02-28 DIAGNOSIS — C50211 Malignant neoplasm of upper-inner quadrant of right female breast: Secondary | ICD-10-CM | POA: Diagnosis present

## 2023-02-28 DIAGNOSIS — C50411 Malignant neoplasm of upper-outer quadrant of right female breast: Secondary | ICD-10-CM

## 2023-02-28 DIAGNOSIS — Z5111 Encounter for antineoplastic chemotherapy: Secondary | ICD-10-CM | POA: Diagnosis present

## 2023-02-28 LAB — CBC WITH DIFFERENTIAL (CANCER CENTER ONLY)
Abs Immature Granulocytes: 1.18 10*3/uL — ABNORMAL HIGH (ref 0.00–0.07)
Basophils Absolute: 0.1 10*3/uL (ref 0.0–0.1)
Basophils Relative: 1 %
Eosinophils Absolute: 0.1 10*3/uL (ref 0.0–0.5)
Eosinophils Relative: 1 %
HCT: 31.6 % — ABNORMAL LOW (ref 36.0–46.0)
Hemoglobin: 11 g/dL — ABNORMAL LOW (ref 12.0–15.0)
Immature Granulocytes: 11 %
Lymphocytes Relative: 19 %
Lymphs Abs: 2.2 10*3/uL (ref 0.7–4.0)
MCH: 29.9 pg (ref 26.0–34.0)
MCHC: 34.8 g/dL (ref 30.0–36.0)
MCV: 85.9 fL (ref 80.0–100.0)
Monocytes Absolute: 1 10*3/uL (ref 0.1–1.0)
Monocytes Relative: 9 %
Neutro Abs: 6.7 10*3/uL (ref 1.7–7.7)
Neutrophils Relative %: 59 %
Platelet Count: 216 10*3/uL (ref 150–400)
RBC: 3.68 MIL/uL — ABNORMAL LOW (ref 3.87–5.11)
RDW: 16.4 % — ABNORMAL HIGH (ref 11.5–15.5)
Smear Review: NORMAL
WBC Count: 11.2 10*3/uL — ABNORMAL HIGH (ref 4.0–10.5)
nRBC: 0.5 % — ABNORMAL HIGH (ref 0.0–0.2)

## 2023-02-28 LAB — CMP (CANCER CENTER ONLY)
ALT: 25 U/L (ref 0–44)
AST: 18 U/L (ref 15–41)
Albumin: 4 g/dL (ref 3.5–5.0)
Alkaline Phosphatase: 132 U/L — ABNORMAL HIGH (ref 38–126)
Anion gap: 6 (ref 5–15)
BUN: 6 mg/dL (ref 6–20)
CO2: 28 mmol/L (ref 22–32)
Calcium: 9.5 mg/dL (ref 8.9–10.3)
Chloride: 106 mmol/L (ref 98–111)
Creatinine: 0.75 mg/dL (ref 0.44–1.00)
GFR, Estimated: >60 mL/min (ref >60)
Glucose, Bld: 104 mg/dL — ABNORMAL HIGH (ref 70–99)
Potassium: 3.8 mmol/L (ref 3.5–5.1)
Sodium: 140 mmol/L (ref 135–145)
Total Bilirubin: 0.2 mg/dL — ABNORMAL LOW (ref 0.3–1.2)
Total Protein: 7 g/dL (ref 6.5–8.1)

## 2023-02-28 LAB — PREGNANCY, URINE: Preg Test, Ur: NEGATIVE

## 2023-02-28 MED ORDER — SODIUM CHLORIDE 0.9 % IV SOLN
80.0000 mg/m2 | Freq: Once | INTRAVENOUS | Status: AC
  Administered 2023-02-28: 168 mg via INTRAVENOUS
  Filled 2023-02-28: qty 28

## 2023-02-28 MED ORDER — ZOLPIDEM TARTRATE 5 MG PO TABS: 5.0000 mg | ORAL_TABLET | Freq: Every evening | ORAL | 1 refills | Status: DC | PRN

## 2023-02-28 MED ORDER — FAMOTIDINE IN NACL 20-0.9 MG/50ML-% IV SOLN
20.0000 mg | Freq: Once | INTRAVENOUS | Status: AC
  Administered 2023-02-28: 20 mg via INTRAVENOUS
  Filled 2023-02-28: qty 50

## 2023-02-28 MED ORDER — LORAZEPAM 1 MG PO TABS
1.0000 mg | ORAL_TABLET | Freq: Once | ORAL | Status: AC
  Administered 2023-02-28: 1 mg via ORAL
  Filled 2023-02-28: qty 1

## 2023-02-28 MED ORDER — SODIUM CHLORIDE 0.9% FLUSH
10.0000 mL | INTRAVENOUS | Status: DC | PRN
  Administered 2023-02-28: 10 mL

## 2023-02-28 MED ORDER — DIPHENHYDRAMINE HCL 50 MG/ML IJ SOLN
50.0000 mg | Freq: Once | INTRAMUSCULAR | Status: AC
  Administered 2023-02-28: 50 mg via INTRAVENOUS
  Filled 2023-02-28: qty 1

## 2023-02-28 MED ORDER — SODIUM CHLORIDE 0.9 % IV SOLN: Freq: Once | INTRAVENOUS | Status: AC

## 2023-02-28 MED ORDER — HEPARIN SOD (PORK) LOCK FLUSH 100 UNIT/ML IV SOLN
500.0000 [IU] | Freq: Once | INTRAVENOUS | Status: AC | PRN
  Administered 2023-02-28: 500 [IU]

## 2023-02-28 MED ORDER — SODIUM CHLORIDE 0.9% FLUSH
10.0000 mL | Freq: Once | INTRAVENOUS | Status: AC
  Administered 2023-02-28: 10 mL

## 2023-02-28 MED ORDER — SODIUM CHLORIDE 0.9 % IV SOLN
10.0000 mg | Freq: Once | INTRAVENOUS | Status: AC
  Administered 2023-02-28: 10 mg via INTRAVENOUS
  Filled 2023-02-28: qty 10

## 2023-02-28 NOTE — Assessment & Plan Note (Signed)
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, DCIS posterior margin positive, lymphovascular invasion not identified, ER 0%, PR 0%, Ki-67 85%, HER2 0 by IHC Margin excision: Posterior margin: Scattered foci of high-grade DCIS, 0/2 lymph nodes negative, final margins negative  (10/02/2022: Initial biopsy was DCIS ER 30% PR 2%)   Treatment plan: Adjuvant chemotherapy with dose dense Adriamycin and Cytoxan followed by Taxol and carboplatin Adjuvant radiation therapy ------------------------------------------------- Current treatment: Completed 4 cycles of dose dense Adriamycin and Cytoxan, today cycle 1 Taxol Chemo toxicities: Fatigue Insomnia Anxiety Hospitalization 01/21/2023 for nasal burning, chest discomfort leukocytosis.  Workup was negative, felt to be related to Neulasta and Cytoxan.  We will reduce the dosage of Cytoxan and will slow the infusion rate down.  Encouraged the patient to take Claritin and Tylenol/ibuprofen for the bone pain. Chemo-induced mucositis/oral candidiasis Abdominal pain: Could be acid reflux but also cramps from chemo. Hospitalization: 02/17/2023-02/21/2023: COVID 19 infection with pneumonia Return to clinic weekly for Taxol and every other week for follow-up with me.

## 2023-02-28 NOTE — Progress Notes (Signed)
Patient Care Team: Hoy Register, MD as PCP - General (Family Medicine) Corky Crafts, MD as PCP - Cardiology (Cardiology) Glendale Chard, DO as Consulting Physician (Neurology) Abigail Miyamoto, MD as Consulting Physician (General Surgery) Serena Croissant, MD as Consulting Physician (Hematology and Oncology) Lonie Peak, MD as Attending Physician (Radiation Oncology) Pershing Proud, RN as Oncology Nurse Navigator Donnelly Angelica, RN as Oncology Nurse Navigator  DIAGNOSIS:  Encounter Diagnosis  Name Primary?   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, ER 30% weak, PR 2%   10/11/2022 Cancer Staging   Staging form: Breast, AJCC 8th Edition - Clinical: Stage 0 (cTis (DCIS), cN0, cM0, G3, ER+, PR+, HER2: Not Assessed) - Signed by Serena Croissant, MD on 10/11/2022 Stage prefix: Initial diagnosis Histologic grading system: 3 grade system   10/11/2022 Genetic Testing   Declined hereditary cancer genetic testing    11/13/2022 Surgery   Right lumpectomy: Grade 3 IDC, 2 cm in size inferior and posterior margins are positive, DCIS posterior margin positive, lymphovascular invasion not identified, ER 0%, PR 0%, Ki-67 85%, HER2 0 by [facility]   12/01/2022 Surgery   Margin excision: Posterior margin: Scattered foci of high-grade DCIS, 0/2 lymph nodes negative   01/01/2023 -  Chemotherapy   Patient is on Treatment Plan : BREAST ADJUVANT DOSE DENSE AC q14d / PACLitaxel q7d       CHIEF COMPLIANT:   Discussed the use of AI scribe software for clinical note transcription with the patient, who  gave verbal consent to proceed.  History of Present Illness   The patient, with a history of cancer, presents with anxiety about starting a new treatment regimen. They express concern about potential side effects, as they have previously experienced adverse reactions to chemotherapy. They report that they generally feel better after each treatment, but are worried about the possibility of feeling worse again. They also mention a recent weight loss of 33 pounds, which they seem pleased about.  The patient also reports difficulty drinking water, which is necessary for flushing their kidneys. They express a distaste for the saline used in their treatments, which they can taste and smell. They also mention a prescription for a sleeping pill, which their insurance only partially covered, resulting in them running out of the medication.  Additionally, the patient has noticed changes in the color of their nails and the bottoms of their feet, which they attribute to their treatments. They express frustration about these changes and ask about the possibility of them returning to normal.         ALLERGIES:  is allergic to latex and tramadol.  MEDICATIONS:  Current Outpatient Medications  Medication Sig Dispense Refill   DULoxetine (CYMBALTA) 60 MG capsule Take 60 mg by mouth daily.     acetaminophen (TYLENOL) 500 MG tablet Take 1,000 mg by mouth every 6 (six) hours as needed for moderate pain.     albuterol (VENTOLIN HFA) 108 (90 Base) MCG/ACT inhaler TAKE 2 PUFFS BY MOUTH EVERY 6 HOURS AS NEEDED FOR WHEEZE OR SHORTNESS OF BREATH (Patient taking differently: Inhale 2 puffs into the lungs every 6 (six) hours as needed for wheezing or  shortness of breath.) 54 each 0   benzonatate (TESSALON) 200 MG capsule Take 1 capsule (200 mg total) by mouth 3 (three) times daily as needed for cough. 21 capsule 0   cyclobenzaprine (FLEXERIL) 10 MG tablet TAKE 1 TABLET BY MOUTH AT BEDTIME AS NEEDED FOR MUSCLE SPASMS  (Patient taking differently: Take 10 mg by mouth at bedtime.) 30 tablet 1   esomeprazole (NEXIUM) 40 MG capsule Take 1 capsule (40 mg total) by mouth 2 (two) times daily before a meal. 60 capsule 3   fluconazole (DIFLUCAN) 150 MG tablet Take 1 tablet (150 mg total) by mouth every three (3) days as needed (Yeast infection). 3 tablet 0   gabapentin (NEURONTIN) 400 MG capsule Take 400 mg by mouth 3 (three) times daily as needed (pain).     HYDROcodone bit-homatropine (HYCODAN) 5-1.5 MG/5ML syrup Take 5 mLs by mouth every 6 (six) hours as needed for cough. 120 mL 0   hydrocortisone (ANUSOL-HC) 2.5 % rectal cream Place rectally 4 (four) times daily as needed for hemorrhoids or anal itching. 30 g 0   hydrOXYzine (ATARAX) 25 MG tablet TAKE 1 TABLET(25 MG) BY MOUTH THREE TIMES DAILY 270 tablet 1   lidocaine (XYLOCAINE) 2 % solution Use as directed 15 mLs in the mouth or throat every 4 (four) hours as needed for mouth pain.     linaclotide (LINZESS) 145 MCG CAPS capsule Take 1 capsule (145 mcg total) by mouth daily before breakfast. 30 capsule 5   methimazole (TAPAZOLE) 5 MG tablet Take 1 tablet (5 mg total) by mouth as directed. 1 tablet Monday through Friday, skip Saturdays and  Sundays 60 tablet 3   metoprolol succinate (TOPROL-XL) 25 MG 24 hr tablet Take 0.5 tablets (12.5 mg total) by mouth daily. For increased heart rate 45 tablet 1   naproxen (NAPROSYN) 500 MG tablet TAKE 1 TABLET BY MOUTH TWICE A DAY AS NEEDED FOR PAIN 60 tablet 1   nystatin (MYCOSTATIN) 100000 UNIT/ML suspension Take 5 mLs (500,000 Units total) by mouth 4 (four) times daily for 7 days. 60 mL 0   ondansetron (ZOFRAN) 8 MG tablet Take 1 tab (8 mg) by mouth every 8 hrs as needed for nausea/vomiting. Start third day after doxorubicin/cyclophosphamide chemotherapy. 30 tablet 1   prochlorperazine (COMPAZINE) 10 MG tablet Take 1 tablet (10 mg total) by mouth every 6 (six) hours as needed for nausea or vomiting. 30 tablet 1   zolpidem (AMBIEN)  5 MG tablet Take 1 tablet (5 mg total) by mouth at bedtime as needed for sleep. 30 tablet 1   No current facility-administered medications for this visit.    PHYSICAL EXAMINATION: ECOG PERFORMANCE STATUS: 1 - Symptomatic but completely ambulatory  Vitals:   02/28/23 1205  BP: (!) 128/91  Pulse: 91  Resp: 18  Temp: (!) 97.2 F (36.2 C)  SpO2: 99%   Filed Weights   02/28/23 1205  Weight: 213 lb 12.8 oz (97 kg)    Physical Exam   MEASUREMENTS: Weight loss of thirty three pounds SKIN: Nail discoloration, hyperpigmentation of feet        LABORATORY DATA:  I have reviewed the data as listed    Latest Ref Rng & Units 02/28/2023   11:54 AM 02/21/2023    8:23 AM 02/20/2023    8:51 AM  CMP  Glucose 70 - 99 mg/dL 782  956  213   BUN 6 - 20 mg/dL 6  8  8    Creatinine 0.44 - 1.00 mg/dL 0.86  0.69  0.68   Sodium 135 - 145 mmol/L 140  133  136   Potassium 3.5 - 5.1 mmol/L 3.8  4.1  3.6   Chloride 98 - 111 mmol/L 106  99  103   CO2 22 - 32 mmol/L 28  25  21    Calcium 8.9 - 10.3 mg/dL 9.5  9.0  9.1   Total Protein 6.5 - 8.1 g/dL 7.0     Total Bilirubin 0.3 - 1.2 mg/dL 0.2     Alkaline Phos 38 - 126 U/L 132     AST 15 - 41 U/L 18     ALT 0 - 44 U/L 25       Lab Results  Component Value Date   WBC 11.2 (H) 02/28/2023   HGB 11.0 (L) 02/28/2023   HCT 31.6 (L) 02/28/2023   MCV 85.9 02/28/2023   PLT 216 02/28/2023   NEUTROABS PENDING 02/28/2023    ASSESSMENT & PLAN:  Malignant neoplasm of upper-outer quadrant of right breast in female, estrogen receptor negative (HCC) 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, DCIS posterior margin positive, lymphovascular invasion not identified, ER 0%, PR 0%, Ki-67 85%, HER2 0 by IHC Margin excision: Posterior margin: Scattered foci of high-grade DCIS, 0/2 lymph nodes negative, final margins negative  (10/02/2022: Initial biopsy was DCIS ER 30% PR 2%)   Treatment plan: Adjuvant  chemotherapy with dose dense Adriamycin and Cytoxan followed by Taxol and carboplatin Adjuvant radiation therapy ------------------------------------------------- Current treatment: Completed 4 cycles of dose dense Adriamycin and Cytoxan, today cycle 1 Taxol Chemo toxicities: Fatigue Insomnia Anxiety Hospitalization 01/21/2023 for nasal burning, chest discomfort leukocytosis.  Workup was negative, felt to be related to Neulasta and Cytoxan.  We will reduce the dosage of Cytoxan and will slow the infusion rate down.  Encouraged the patient to take Claritin and Tylenol/ibuprofen for the bone pain. Chemo-induced mucositis/oral candidiasis Abdominal pain: Could be acid reflux but also cramps from chemo. Hospitalization: 02/17/2023-02/21/2023: COVID 19 infection with pneumonia Return to clinic weekly for Taxol and every other week for follow-up with me. ------------------------------------- Assessment and Plan    Cancer Treatment Patient is anxious about starting a new chemotherapy regimen. Previous treatment was associated with significant side effects, including a severe reaction to an injection. The new regimen is expected to be less intense (3 on a scale of 10 compared to the previous regimen's 10). -Start new chemotherapy regimen today, with a slow initial administration to monitor for adverse reactions. -Provide Xanax to manage anxiety related to treatment.  Insomnia Patient ran out of Ambien due to insurance coverage limitations. -Refill Ambien prescription with additional refills.  Skin and Nail Changes Patient has noticed darkening of the nails and skin, likely related to chemotherapy. -Reassure patient that these changes are expected and should gradually improve over time, possibly up to six months.  Dehydration Patient is struggling to drink enough water, which is important for kidney function during chemotherapy. -Encourage patient to try different types of water or add flavorings  to improve palatability and increase fluid intake.  Follow-up Labs show adequate white blood cell count and near-normal hemoglobin, suggesting good bone marrow function despite intensive treatment. -Proceed with chemotherapy today and continue to monitor labs.          No orders of the defined types were placed in this encounter.  The patient has a good understanding of the overall plan. she agrees with it. she will call with  any problems that may develop before the next visit here. Total time spent: 30 mins including face to face time and time spent for planning, charting and co-ordination of care   Tamsen Meek, MD 02/28/23

## 2023-02-28 NOTE — Patient Instructions (Signed)
[place] [facility]  Discharge Instructions: Thank you for choosing [facility] to provide your oncology and hematology care.   If you have a lab appointment with the Cancer Center, please go directly to the Cancer Center and check in at the registration area.   Wear comfortable clothing and clothing appropriate for easy access to any Portacath or PICC line.   We strive to give you quality time with your provider. You may need to reschedule your appointment if you arrive late (15 or more minutes).  Arriving late affects you and other patients whose appointments are after yours.  Also, if you miss three or more appointments without notifying the office, you may be dismissed from the clinic at the provider's discretion.      For prescription refill requests, have your pharmacy contact our office and allow 72 hours for refills to be completed.    Today you received the following chemotherapy and/or immunotherapy agents Paclitaxel       To help prevent nausea and vomiting after your treatment, we encourage you to take your nausea medication as directed.  BELOW ARE SYMPTOMS THAT SHOULD BE REPORTED IMMEDIATELY: *FEVER GREATER THAN 100.4 F (38 C) OR HIGHER *CHILLS OR SWEATING *NAUSEA AND VOMITING THAT IS NOT CONTROLLED WITH YOUR NAUSEA MEDICATION *UNUSUAL SHORTNESS OF BREATH *UNUSUAL BRUISING OR BLEEDING *URINARY PROBLEMS (pain or burning when urinating, or frequent urination) *BOWEL PROBLEMS (unusual diarrhea, constipation, pain near the anus) TENDERNESS IN MOUTH AND THROAT WITH OR WITHOUT PRESENCE OF ULCERS (sore throat, sores in mouth, or a toothache) UNUSUAL RASH, SWELLING OR PAIN  UNUSUAL VAGINAL DISCHARGE OR ITCHING   Items with * indicate a potential emergency and should be followed up as soon as possible or go to the Emergency Department if any problems should occur.  Please show the CHEMOTHERAPY ALERT CARD or IMMUNOTHERAPY ALERT CARD at  check-in to the Emergency Department and triage nurse.  Should you have questions after your visit or need to cancel or reschedule your appointment, please contact [place] [facility]  Dept: [phone]  and follow the prompts.  Office hours are 8:00 a.m. to 4:30 p.m. Monday - Friday. Please note that voicemails left after 4:00 p.m. may not be returned until the following business day.  We are closed weekends and major holidays. You have access to a nurse at all times for urgent questions. Please call the main number to the clinic Dept: [phone] and follow the prompts.   For any non-urgent questions, you may also contact your provider using MyChart. We now offer e-Visits for anyone 18 and older to request care online for non-urgent symptoms. For details visit [email or web address].   Also download the MyChart app! Go to the app store, search "MyChart", open the app, select [place], and log in with your MyChart username and password.  Paclitaxel Injection What is this medication? PACLITAXEL (PAK li TAX el) treats some types of cancer. It works by slowing down the growth of cancer cells. This medicine may be used for other purposes; ask your health care provider or pharmacist if you have questions. COMMON BRAND NAME(S): Onxol, Taxol What should I tell my care team before I take this medication? They need to know if you have any of these conditions: Heart disease Liver disease Low white blood cell levels An unusual or allergic reaction to paclitaxel, other medications, foods, dyes, or preservatives If you or your partner are pregnant or trying to  get pregnant Breast-feeding How should I use this medication? This medication is injected into a vein. It is given by your care team in a hospital or clinic setting. Talk to your care team about the use of this medication in children. While it may be given to children for selected conditions, precautions do  apply. Overdosage: If you think you have taken too much of this medicine contact a poison control center or emergency room at once. NOTE: This medicine is only for you. Do not share this medicine with others. What if I miss a dose? Keep appointments for follow-up doses. It is important not to miss your dose. Call your care team if you are unable to keep an appointment. What may interact with this medication? Do not take this medication with any of the following: Live virus vaccines Other medications may affect the way this medication works. Talk with your care team about all of the medications you take. They may suggest changes to your treatment plan to lower the risk of side effects and to make sure your medications work as intended. This list may not describe all possible interactions. Give your health care provider a list of all the medicines, herbs, non-prescription drugs, or dietary supplements you use. Also tell them if you smoke, drink alcohol, or use illegal drugs. Some items may interact with your medicine. What should I watch for while using this medication? Your condition will be monitored carefully while you are receiving this medication. You may need blood work while taking this medication. This medication may make you feel generally unwell. This is not uncommon as chemotherapy can affect healthy cells as well as cancer cells. Report any side effects. Continue your course of treatment even though you feel ill unless your care team tells you to stop. This medication can cause serious allergic reactions. To reduce the risk, your care team may give you other medications to take before receiving this one. Be sure to follow the directions from your care team. This medication may increase your risk of getting an infection. Call your care team for advice if you get a fever, chills, sore throat, or other symptoms of a cold or flu. Do not treat yourself. Try to avoid being around people who are  sick. This medication may increase your risk to bruise or bleed. Call your care team if you notice any unusual bleeding. Be careful brushing or flossing your teeth or using a toothpick because you may get an infection or bleed more easily. If you have any dental work done, tell your dentist you are receiving this medication. Talk to your care team if you may be pregnant. Serious birth defects can occur if you take this medication during pregnancy. Talk to your care team before breastfeeding. Changes to your treatment plan may be needed. What side effects may I notice from receiving this medication? Side effects that you should report to your care team as soon as possible: Allergic reactions--skin rash, itching, hives, swelling of the face, lips, tongue, or throat Heart rhythm changes--fast or irregular heartbeat, dizziness, feeling faint or lightheaded, chest pain, trouble breathing Increase in blood pressure Infection--fever, chills, cough, sore throat, wounds that don't heal, pain or trouble when passing urine, general feeling of discomfort or being unwell Low blood pressure--dizziness, feeling faint or lightheaded, blurry vision Low red blood cell level--unusual weakness or fatigue, dizziness, headache, trouble breathing Painful swelling, warmth, or redness of the skin, blisters or sores at the infusion site Pain, tingling, or numbness  in the hands or feet Slow heartbeat--dizziness, feeling faint or lightheaded, confusion, trouble breathing, unusual weakness or fatigue Unusual bruising or bleeding Side effects that usually do not require medical attention (report to your care team if they continue or are bothersome): Diarrhea Hair loss Joint pain Loss of appetite Muscle pain Nausea Vomiting This list may not describe all possible side effects. Call your doctor for medical advice about side effects. You may report side effects to FDA at 1-800-FDA-1088. Where should I keep my  medication? This medication is given in a hospital or clinic. It will not be stored at home. NOTE: This sheet is a summary. It may not cover all possible information. If you have questions about this medicine, talk to your doctor, pharmacist, or health care provider.  2024 Elsevier/Gold Standard (2021-10-18 00:00:00)

## 2023-03-01 ENCOUNTER — Telehealth: Payer: Self-pay

## 2023-03-01 NOTE — Telephone Encounter (Signed)
-----   Message from Nurse Currie Paris sent at 02/28/2023  4:24 PM EDT ----- Regarding: First time Paclitaxel Pt of [place] First time Paclitaxel. Pt of [place]. Tolerated well. VSS. Please check in on patient - thank you!

## 2023-03-01 NOTE — Telephone Encounter (Signed)
LM for patient that this nurse was calling to see how they were doing after their treatment. Please call back to Dr. Earmon Phoenix nurse at [phone] if they have any questions or concerns regarding the treatment.

## 2023-03-05 ENCOUNTER — Ambulatory Visit: Payer: 59 | Attending: Hematology and Oncology

## 2023-03-05 VITALS — Wt 211.0 lb

## 2023-03-05 DIAGNOSIS — Z483 Aftercare following surgery for neoplasm: Secondary | ICD-10-CM | POA: Insufficient documentation

## 2023-03-05 MED FILL — Dexamethasone Sodium Phosphate Inj 100 MG/10ML: INTRAMUSCULAR | Qty: 1 | Status: AC

## 2023-03-05 NOTE — Therapy (Signed)
OUTPATIENT PHYSICAL THERAPY SOZO SCREENING NOTE   Patient Name: Laurie Bell MRN: [id number] DOB:05-Nov-1980, 42 y.o., female Today's Date: 03/05/2023  PCP: Hoy Register, MD REFERRING PROVIDER: Serena Croissant, MD   PT End of Session - 03/05/23 1527     Visit Number 2   # unchanged due to screen only   PT Start Time 1525    PT Stop Time 1530    PT Time Calculation (min) 5 min    Activity Tolerance Patient tolerated treatment well    Behavior During Therapy WFL for tasks assessed/performed             Past Medical History:  Diagnosis Date   Anxiety    Arthritis    Breast cancer (HCC)    Chlamydia contact, treated    GERD (gastroesophageal reflux disease)    Hyperthyroidism    Past Surgical History:  Procedure Laterality Date   AXILLARY SENTINEL NODE BIOPSY Right 12/01/2022   Procedure: RIGHT AXILLARY SENTINEL NODE BIOPSY;  Surgeon: Abigail Miyamoto, MD;  Location: [facility] OR;  Service: General;  Laterality: Right;   BREAST BIOPSY Right 10/02/2022   MM RT BREAST BX W LOC DEV 1ST LESION IMAGE BX SPEC STEREO GUIDE 10/02/2022 [facility]   BREAST BIOPSY  11/10/2022   MM RT RADIOACTIVE SEED LOC MAMMO GUIDE 11/10/2022 [facility]   BREAST LUMPECTOMY WITH RADIOACTIVE SEED LOCALIZATION Right 11/13/2022   Procedure: RIGHT BREAST LUMPECTOMY WITH RADIOACTIVE SEED LOCALIZATION;  Surgeon: Abigail Miyamoto, MD;  Location: [facility];  Service: General;  Laterality: Right;   ENDOMETRIAL ABLATION N/A 04/25/2017   Procedure: ENDOMETRIAL ABLATION With NOVASURE;  Surgeon: Adam Phenix, MD;  Location: [facility];  Service: Gynecology;  Laterality: N/A;   ESOPHAGEAL MANOMETRY N/A 07/27/2021   Procedure: ESOPHAGEAL MANOMETRY (EM);  Surgeon: Imogene Burn, MD;  Location: [facility];  Service: Gastroenterology;  Laterality: N/A;   PORTACATH PLACEMENT N/A 12/01/2022   Procedure: INSERTION PORT-A-CATH WITH ULTRASOUND GUIDANCE;  Surgeon: Abigail Miyamoto, MD;  Location: [facility] OR;  Service: General;  Laterality: N/A;   RE-EXCISION OF BREAST CANCER,SUPERIOR MARGINS Right 12/01/2022   Procedure: RE-EXCISION OF RIGHT BREAST CANCER;  Surgeon: Abigail Miyamoto, MD;  Location: [facility] OR;  Service: General;  Laterality: Right;   TUBAL LIGATION  2003   Patient Active Problem List   Diagnosis Date Noted   Sepsis (HCC) 02/17/2023   COVID-19 virus infection 02/17/2023   Lower extremity pain, anterior, right 01/22/2023   SIRS (systemic inflammatory response syndrome) (HCC) 01/21/2023   Port-A-Cath in place 01/01/2023   Malignant neoplasm of upper-outer quadrant of right breast in female, estrogen receptor negative (HCC) 10/09/2022   Multinodular goiter 03/22/2022   Hyperthyroidism 03/22/2022   MDD (major depressive disorder), recurrent episode, moderate (HCC) 11/03/2021   GAD (generalized anxiety disorder) 11/03/2021   Neck pain 10/28/2021   Cervical high risk HPV (human papillomavirus) test positive 10/17/2021   Irregular heart rhythm 04/15/2021   Numbness on left side 04/15/2021   Dysphagia 03/17/2021   Hypomagnesemia 02/23/2021   Gastroesophageal reflux disease    Hypokalemia 08/24/2020   Atypical chest pain 08/24/2020   Nausea and vomiting 08/24/2020   Hypocalcemia 08/24/2020   DUB (dysfunctional uterine bleeding) 01/12/2017   Fibroid, uterine 01/12/2017    REFERRING DIAG: right breast cancer at risk for lymphedema  THERAPY DIAG: Aftercare following surgery for neoplasm  PERTINENT HISTORY: 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 It measures 1 cm and is located in the upper-inner  quadrant. It is ER 30%+, PR>1% +, HER 2 unknown and Ki 67 unknown. She had a right lumpectomy performed on 11/13/2022. 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 She also had several positive margins and is now s/p a Right Re-excision with SLNB on 12/01/2022 with 0/2 LN's.   PRECAUTIONS: right  UE Lymphedema risk, None  SUBJECTIVE: Pt returns for her first 3 month L-Dex screen.   PAIN:  Are you having pain? No  SOZO SCREENING: Patient was assessed today using the SOZO machine to determine the lymphedema index score. This was compared to her baseline score. It was determined that she is within the recommended range when compared to her baseline and no further action is needed at this time. She will continue SOZO screenings. These are done every 3 months for 2 years post operatively followed by every 6 months for 2 years, and then annually.   L-DEX FLOWSHEETS - 03/05/23 1500       L-DEX LYMPHEDEMA SCREENING   Measurement Type Unilateral    L-DEX MEASUREMENT EXTREMITY Upper Extremity    POSITION  Standing    DOMINANT SIDE Right    At Risk Side Right    BASELINE SCORE (UNILATERAL) 2.6    L-DEX SCORE (UNILATERAL) 0.2    VALUE CHANGE (UNILAT) -2.4               Hermenia Bers, PTA 03/05/2023, 3:30 PM

## 2023-03-06 ENCOUNTER — Inpatient Hospital Stay: Payer: 59

## 2023-03-06 ENCOUNTER — Inpatient Hospital Stay (HOSPITAL_BASED_OUTPATIENT_CLINIC_OR_DEPARTMENT_OTHER): Payer: 59 | Admitting: Hematology and Oncology

## 2023-03-06 VITALS — HR 93

## 2023-03-06 VITALS — BP 126/80 | HR 108 | Temp 97.3°F | Resp 18 | Ht 66.0 in | Wt 213.0 lb

## 2023-03-06 DIAGNOSIS — C50411 Malignant neoplasm of upper-outer quadrant of right female breast: Secondary | ICD-10-CM

## 2023-03-06 DIAGNOSIS — Z95828 Presence of other vascular implants and grafts: Secondary | ICD-10-CM

## 2023-03-06 DIAGNOSIS — Z171 Estrogen receptor negative status [ER-]: Secondary | ICD-10-CM

## 2023-03-06 DIAGNOSIS — Z5111 Encounter for antineoplastic chemotherapy: Secondary | ICD-10-CM | POA: Diagnosis not present

## 2023-03-06 LAB — CMP (CANCER CENTER ONLY)
ALT: 29 U/L (ref 0–44)
AST: 21 U/L (ref 15–41)
Albumin: 4 g/dL (ref 3.5–5.0)
Alkaline Phosphatase: 110 U/L (ref 38–126)
Anion gap: 6 (ref 5–15)
BUN: 11 mg/dL (ref 6–20)
CO2: 26 mmol/L (ref 22–32)
Calcium: 9.3 mg/dL (ref 8.9–10.3)
Chloride: 106 mmol/L (ref 98–111)
Creatinine: 0.75 mg/dL (ref 0.44–1.00)
GFR, Estimated: >60 mL/min (ref >60)
Glucose, Bld: 103 mg/dL — ABNORMAL HIGH (ref 70–99)
Potassium: 3.9 mmol/L (ref 3.5–5.1)
Sodium: 138 mmol/L (ref 135–145)
Total Bilirubin: 0.4 mg/dL (ref 0.3–1.2)
Total Protein: 7.1 g/dL (ref 6.5–8.1)

## 2023-03-06 LAB — CBC WITH DIFFERENTIAL (CANCER CENTER ONLY)
Abs Immature Granulocytes: 0.01 10*3/uL (ref 0.00–0.07)
Basophils Absolute: 0.1 10*3/uL (ref 0.0–0.1)
Basophils Relative: 1 %
Eosinophils Absolute: 0.1 10*3/uL (ref 0.0–0.5)
Eosinophils Relative: 1 %
HCT: 32.2 % — ABNORMAL LOW (ref 36.0–46.0)
Hemoglobin: 10.9 g/dL — ABNORMAL LOW (ref 12.0–15.0)
Immature Granulocytes: 0 %
Lymphocytes Relative: 34 %
Lymphs Abs: 1.4 10*3/uL (ref 0.7–4.0)
MCH: 29.6 pg (ref 26.0–34.0)
MCHC: 33.9 g/dL (ref 30.0–36.0)
MCV: 87.5 fL (ref 80.0–100.0)
Monocytes Absolute: 0.5 10*3/uL (ref 0.1–1.0)
Monocytes Relative: 12 %
Neutro Abs: 2.1 10*3/uL (ref 1.7–7.7)
Neutrophils Relative %: 52 %
Platelet Count: 308 10*3/uL (ref 150–400)
RBC: 3.68 MIL/uL — ABNORMAL LOW (ref 3.87–5.11)
RDW: 16.2 % — ABNORMAL HIGH (ref 11.5–15.5)
WBC Count: 4.1 10*3/uL (ref 4.0–10.5)
nRBC: 0 % (ref 0.0–0.2)

## 2023-03-06 MED ORDER — FAMOTIDINE IN NACL 20-0.9 MG/50ML-% IV SOLN
20.0000 mg | Freq: Once | INTRAVENOUS | Status: AC
  Administered 2023-03-06: 20 mg via INTRAVENOUS
  Filled 2023-03-06: qty 50

## 2023-03-06 MED ORDER — DIPHENHYDRAMINE HCL 50 MG/ML IJ SOLN
25.0000 mg | Freq: Once | INTRAMUSCULAR | Status: AC
  Administered 2023-03-06: 25 mg via INTRAVENOUS
  Filled 2023-03-06: qty 1

## 2023-03-06 MED ORDER — SODIUM CHLORIDE 0.9 % IV SOLN: Freq: Once | INTRAVENOUS | Status: AC

## 2023-03-06 MED ORDER — SODIUM CHLORIDE 0.9% FLUSH
10.0000 mL | Freq: Once | INTRAVENOUS | Status: AC
  Administered 2023-03-06: 10 mL

## 2023-03-06 MED ORDER — SODIUM CHLORIDE 0.9% FLUSH
10.0000 mL | INTRAVENOUS | Status: DC | PRN
  Administered 2023-03-06: 10 mL

## 2023-03-06 MED ORDER — HEPARIN SOD (PORK) LOCK FLUSH 100 UNIT/ML IV SOLN
500.0000 [IU] | Freq: Once | INTRAVENOUS | Status: AC | PRN
  Administered 2023-03-06: 500 [IU]

## 2023-03-06 MED ORDER — SODIUM CHLORIDE 0.9 % IV SOLN
10.0000 mg | Freq: Once | INTRAVENOUS | Status: AC
  Administered 2023-03-06: 10 mg via INTRAVENOUS
  Filled 2023-03-06: qty 10

## 2023-03-06 MED ORDER — SODIUM CHLORIDE 0.9 % IV SOLN
65.0000 mg/m2 | Freq: Once | INTRAVENOUS | Status: AC
  Administered 2023-03-06: 138 mg via INTRAVENOUS
  Filled 2023-03-06: qty 23

## 2023-03-06 MED ORDER — PALONOSETRON HCL INJECTION 0.25 MG/5ML
0.2500 mg | Freq: Once | INTRAVENOUS | Status: AC
  Administered 2023-03-06: 0.25 mg via INTRAVENOUS
  Filled 2023-03-06: qty 5

## 2023-03-06 NOTE — Progress Notes (Signed)
Patient Care Team: Hoy Register, MD as PCP - General (Family Medicine) Corky Crafts, MD as PCP - Cardiology (Cardiology) Glendale Chard, DO as Consulting Physician (Neurology) Abigail Miyamoto, MD as Consulting Physician (General Surgery) Serena Croissant, MD as Consulting Physician (Hematology and Oncology) Lonie Peak, MD as Attending Physician (Radiation Oncology) Pershing Proud, RN as Oncology Nurse Navigator Donnelly Angelica, RN as Oncology Nurse Navigator  DIAGNOSIS:  Encounter Diagnosis  Name Primary?   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, ER 30% weak, PR 2%   10/11/2022 Cancer Staging   Staging form: Breast, AJCC 8th Edition - Clinical: Stage 0 (cTis (DCIS), cN0, cM0, G3, ER+, PR+, HER2: Not Assessed) - Signed by Serena Croissant, MD on 10/11/2022 Stage prefix: Initial diagnosis Histologic grading system: 3 grade system   10/11/2022 Genetic Testing   Declined hereditary cancer genetic testing    11/13/2022 Surgery   Right lumpectomy: Grade 3 IDC, 2 cm in size inferior and posterior margins are positive, DCIS posterior margin positive, lymphovascular invasion not identified, ER 0%, PR 0%, Ki-67 85%, HER2 0 by [facility]   12/01/2022 Surgery   Margin excision: Posterior margin: Scattered foci of high-grade DCIS, 0/2 lymph nodes negative   01/01/2023 -  Chemotherapy   Patient is on Treatment Plan : BREAST ADJUVANT DOSE DENSE AC q14d / PACLitaxel q7d       CHIEF COMPLIANT: Cycle 2 Taxol  Discussed the use of AI scribe software for clinical note transcription with the  patient, who gave verbal consent to proceed.  History of Present Illness   The patient, with a history of cancer, presents with increased fatigue and nausea following her recent Taxol chemotherapy. She reports feeling 'tired' and 'weak,' and has been sleeping a lot. She also experienced bone pain. The patient notes that the nausea was worse with this round of chemotherapy compared to previous ones. She has been taking prescribed anti-nausea medication. The patient also reports dizziness, which she attributes to the high dose of Benadryl she has been taking. She has been managing to sleep without issue. Her energy levels have been low, but she was able to leave the house for the first time a few days ago. She expresses anxiety about her treatment, fearing hospitalization.         ALLERGIES:  is allergic to latex and tramadol.  MEDICATIONS:  Current Outpatient Medications  Medication Sig Dispense Refill   acetaminophen (TYLENOL) 500 MG tablet Take 1,000 mg by mouth every 6 (six) hours as needed for moderate pain.     albuterol (VENTOLIN HFA) 108 (90 Base) MCG/ACT inhaler TAKE 2 PUFFS BY MOUTH EVERY 6 HOURS AS NEEDED FOR WHEEZE OR SHORTNESS OF BREATH (Patient taking differently: Inhale 2 puffs into the lungs every 6 (six) hours as needed for wheezing or shortness of breath.) 54 each 0   benzonatate (TESSALON) 200 MG capsule Take 1 capsule (200 mg total) by mouth 3 (three) times daily as needed for cough. 21 capsule 0   cyclobenzaprine (FLEXERIL) 10 MG tablet TAKE 1 TABLET BY MOUTH AT BEDTIME AS NEEDED FOR MUSCLE SPASMS (Patient taking differently: Take 10 mg by mouth at bedtime.)  30 tablet 1   DULoxetine (CYMBALTA) 60 MG capsule Take 60 mg by mouth daily.     esomeprazole (NEXIUM) 40 MG capsule Take 1 capsule (40 mg total) by mouth 2 (two) times daily before a meal. 60 capsule 3   fluconazole (DIFLUCAN) 150 MG tablet Take 1 tablet (150 mg total) by mouth every three (3) days as needed (Yeast  infection). 3 tablet 0   gabapentin (NEURONTIN) 400 MG capsule Take 400 mg by mouth 3 (three) times daily as needed (pain).     HYDROcodone bit-homatropine (HYCODAN) 5-1.5 MG/5ML syrup Take 5 mLs by mouth every 6 (six) hours as needed for cough. 120 mL 0   hydrocortisone (ANUSOL-HC) 2.5 % rectal cream Place rectally 4 (four) times daily as needed for hemorrhoids or anal itching. 30 g 0   hydrOXYzine (ATARAX) 25 MG tablet TAKE 1 TABLET(25 MG) BY MOUTH THREE TIMES DAILY 270 tablet 1   lidocaine (XYLOCAINE) 2 % solution Use as directed 15 mLs in the mouth or throat every 4 (four) hours as needed for mouth pain.     linaclotide (LINZESS) 145 MCG CAPS capsule Take 1 capsule (145 mcg total) by mouth daily before breakfast. 30 capsule 5   methimazole (TAPAZOLE) 5 MG tablet Take 1 tablet (5 mg total) by mouth as directed. 1 tablet Monday through Friday, skip Saturdays and  Sundays 60 tablet 3   metoprolol succinate (TOPROL-XL) 25 MG 24 hr tablet Take 0.5 tablets (12.5 mg total) by mouth daily. For increased heart rate 45 tablet 1   naproxen (NAPROSYN) 500 MG tablet TAKE 1 TABLET BY MOUTH TWICE A DAY AS NEEDED FOR PAIN 60 tablet 1   ondansetron (ZOFRAN) 8 MG tablet Take 1 tab (8 mg) by mouth every 8 hrs as needed for nausea/vomiting. Start third day after doxorubicin/cyclophosphamide chemotherapy. 30 tablet 1   prochlorperazine (COMPAZINE) 10 MG tablet Take 1 tablet (10 mg total) by mouth every 6 (six) hours as needed for nausea or vomiting. 30 tablet 1   zolpidem (AMBIEN) 5 MG tablet Take 1 tablet (5 mg total) by mouth at bedtime as needed for sleep. 30 tablet 1   No current facility-administered medications for this visit.    PHYSICAL EXAMINATION: ECOG PERFORMANCE STATUS: 1 - Symptomatic but completely ambulatory  Vitals:   03/06/23 1120  BP: 126/80  Pulse: (!) 108  Resp: 18  Temp: (!) 97.3 F (36.3 C)  SpO2: 100%   Filed Weights   03/06/23 1120  Weight: 213 lb (96.6 kg)    Physical Exam           (exam performed in the presence of a chaperone)  LABORATORY DATA:  I have reviewed the data as listed    Latest Ref Rng & Units 02/28/2023   11:54 AM 02/21/2023    8:23 AM 02/20/2023    8:51 AM  CMP  Glucose 70 - 99 mg/dL 161  096  045   BUN 6 - 20 mg/dL 6  8  8    Creatinine 0.44 - 1.00 mg/dL 4.09  8.11  9.14   Sodium 135 - 145 mmol/L 140  133  136   Potassium 3.5 - 5.1 mmol/L 3.8  4.1  3.6   Chloride 98 - 111 mmol/L 106  99  103   CO2 22 - 32 mmol/L 28  25  21    Calcium 8.9 - 10.3 mg/dL 9.5  9.0  9.1   Total Protein 6.5 - 8.1 g/dL 7.0  Total Bilirubin 0.3 - 1.2 mg/dL 0.2     Alkaline Phos 38 - 126 U/L 132     AST 15 - 41 U/L 18     ALT 0 - 44 U/L 25       Lab Results  Component Value Date   WBC 4.1 03/06/2023   HGB 10.9 (L) 03/06/2023   HCT 32.2 (L) 03/06/2023   MCV 87.5 03/06/2023   PLT 308 03/06/2023   NEUTROABS 2.1 03/06/2023    ASSESSMENT & PLAN:  Malignant neoplasm of upper-outer quadrant of right breast in female, estrogen receptor negative (HCC) 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, DCIS posterior margin positive, lymphovascular invasion not identified, ER 0%, PR 0%, Ki-67 85%, HER2 0 by IHC Margin excision: Posterior margin: Scattered foci of high-grade DCIS, 0/2 lymph nodes negative, final margins negative  (10/02/2022: Initial biopsy was DCIS ER 30% PR 2%)   Treatment plan: Adjuvant chemotherapy with dose dense Adriamycin and Cytoxan followed by Taxol and carboplatin Adjuvant radiation therapy ------------------------------------------------- Current treatment: Completed 4 cycles of dose dense Adriamycin and Cytoxan, today cycle 2 Taxol Chemo toxicities: Fatigue Insomnia Anxiety Hospitalization 01/21/2023 for nasal burning, chest discomfort leukocytosis.    Chemo-induced mucositis/oral candidiasis Nausea: I added Aloxi to today's treatment. Reduce the dosage of  Benadryl. Hospitalization: 02/17/2023-02/21/2023: COVID 19 infection with pneumonia  I reduce the dosage of Taxol today to 65 mg/m. Monitoring her white blood cell count. Return to clinic weekly for Taxol and every other week for follow-up with me. ------------------------------------- Assessment and Plan    Chemotherapy-induced Nausea and Fatigue Patient reports increased fatigue and nausea with Taxol. Dizziness reported with high dose Benadryl. -Add Aloxi to treatment regimen for improved nausea control. -Reduce Benadryl dosage to mitigate dizziness. -Encourage consistent use of compression socks and ice for comfort.  Neutropenia Risk White blood cell count decreased from 11.2 to 4.1 after discontinuation of shots. Neutrophils at 2.1, within normal range but expected to decrease with further treatment. -Reduce Taxol dosage from 80mg /m2 to 65mg /m2 to prevent further decrease in white blood cell count. -Monitor white blood cell count closely, consider administration of neutrophil-boosting shot if count drops below 1.          Orders Placed This Encounter  Procedures   CBC with Differential (Cancer Center Only)    Standing Status:   Future    Standing Expiration Date:   05/01/2024   CMP (Cancer Center only)    Standing Status:   Future    Standing Expiration Date:   05/01/2024   CBC with Differential (Cancer Center Only)    Standing Status:   Future    Standing Expiration Date:   05/08/2024   CMP (Cancer Center only)    Standing Status:   Future    Standing Expiration Date:   05/08/2024   CBC with Differential (Cancer Center Only)    Standing Status:   Future    Standing Expiration Date:   05/15/2024   CMP (Cancer Center only)    Standing Status:   Future    Standing Expiration Date:   05/15/2024   The patient has a good understanding of the overall plan. she agrees with it. she will call with any problems that may develop before the next visit here. Total time spent: 30  mins including face to face time and time spent for planning, charting and co-ordination of care   Tamsen Meek, MD 03/06/23

## 2023-03-06 NOTE — Patient Instructions (Signed)
[place] [facility]  Discharge Instructions: Thank you for choosing [facility] to provide your oncology and hematology care.   If you have a lab appointment with the Cancer Center, please go directly to the Cancer Center and check in at the registration area.   Wear comfortable clothing and clothing appropriate for easy access to any Portacath or PICC line.   We strive to give you quality time with your provider. You may need to reschedule your appointment if you arrive late (15 or more minutes).  Arriving late affects you and other patients whose appointments are after yours.  Also, if you miss three or more appointments without notifying the office, you may be dismissed from the clinic at the provider's discretion.      For prescription refill requests, have your pharmacy contact our office and allow 72 hours for refills to be completed.    Today you received the following chemotherapy and/or immunotherapy agents: Paclitaxel      To help prevent nausea and vomiting after your treatment, we encourage you to take your nausea medication as directed.  BELOW ARE SYMPTOMS THAT SHOULD BE REPORTED IMMEDIATELY: *FEVER GREATER THAN 100.4 F (38 C) OR HIGHER *CHILLS OR SWEATING *NAUSEA AND VOMITING THAT IS NOT CONTROLLED WITH YOUR NAUSEA MEDICATION *UNUSUAL SHORTNESS OF BREATH *UNUSUAL BRUISING OR BLEEDING *URINARY PROBLEMS (pain or burning when urinating, or frequent urination) *BOWEL PROBLEMS (unusual diarrhea, constipation, pain near the anus) TENDERNESS IN MOUTH AND THROAT WITH OR WITHOUT PRESENCE OF ULCERS (sore throat, sores in mouth, or a toothache) UNUSUAL RASH, SWELLING OR PAIN  UNUSUAL VAGINAL DISCHARGE OR ITCHING   Items with * indicate a potential emergency and should be followed up as soon as possible or go to the Emergency Department if any problems should occur.  Please show the CHEMOTHERAPY ALERT CARD or IMMUNOTHERAPY ALERT CARD at  check-in to the Emergency Department and triage nurse.  Should you have questions after your visit or need to cancel or reschedule your appointment, please contact [place] [facility]  Dept: [phone]  and follow the prompts.  Office hours are 8:00 a.m. to 4:30 p.m. Monday - Friday. Please note that voicemails left after 4:00 p.m. may not be returned until the following business day.  We are closed weekends and major holidays. You have access to a nurse at all times for urgent questions. Please call the main number to the clinic Dept: [phone] and follow the prompts.   For any non-urgent questions, you may also contact your provider using MyChart. We now offer e-Visits for anyone 35 and older to request care online for non-urgent symptoms. For details visit [email or web address].   Also download the MyChart app! Go to the app store, search "MyChart", open the app, select , and log in with your MyChart username and password.  Paclitaxel Injection What is this medication? PACLITAXEL (PAK li TAX el) treats some types of cancer. It works by slowing down the growth of cancer cells. This medicine may be used for other purposes; ask your health care provider or pharmacist if you have questions. COMMON BRAND NAME(S): Onxol, Taxol What should I tell my care team before I take this medication? They need to know if you have any of these conditions: Heart disease Liver disease Low white blood cell levels An unusual or allergic reaction to paclitaxel, other medications, foods, dyes, or preservatives If you or your partner are pregnant or trying to get  pregnant Breast-feeding How should I use this medication? This medication is injected into a vein. It is given by your care team in a hospital or clinic setting. Talk to your care team about the use of this medication in children. While it may be given to children for selected conditions, precautions do  apply. Overdosage: If you think you have taken too much of this medicine contact a poison control center or emergency room at once. NOTE: This medicine is only for you. Do not share this medicine with others. What if I miss a dose? Keep appointments for follow-up doses. It is important not to miss your dose. Call your care team if you are unable to keep an appointment. What may interact with this medication? Do not take this medication with any of the following: Live virus vaccines Other medications may affect the way this medication works. Talk with your care team about all of the medications you take. They may suggest changes to your treatment plan to lower the risk of side effects and to make sure your medications work as intended. This list may not describe all possible interactions. Give your health care provider a list of all the medicines, herbs, non-prescription drugs, or dietary supplements you use. Also tell them if you smoke, drink alcohol, or use illegal drugs. Some items may interact with your medicine. What should I watch for while using this medication? Your condition will be monitored carefully while you are receiving this medication. You may need blood work while taking this medication. This medication may make you feel generally unwell. This is not uncommon as chemotherapy can affect healthy cells as well as cancer cells. Report any side effects. Continue your course of treatment even though you feel ill unless your care team tells you to stop. This medication can cause serious allergic reactions. To reduce the risk, your care team may give you other medications to take before receiving this one. Be sure to follow the directions from your care team. This medication may increase your risk of getting an infection. Call your care team for advice if you get a fever, chills, sore throat, or other symptoms of a cold or flu. Do not treat yourself. Try to avoid being around people who are  sick. This medication may increase your risk to bruise or bleed. Call your care team if you notice any unusual bleeding. Be careful brushing or flossing your teeth or using a toothpick because you may get an infection or bleed more easily. If you have any dental work done, tell your dentist you are receiving this medication. Talk to your care team if you may be pregnant. Serious birth defects can occur if you take this medication during pregnancy. Talk to your care team before breastfeeding. Changes to your treatment plan may be needed. What side effects may I notice from receiving this medication? Side effects that you should report to your care team as soon as possible: Allergic reactions--skin rash, itching, hives, swelling of the face, lips, tongue, or throat Heart rhythm changes--fast or irregular heartbeat, dizziness, feeling faint or lightheaded, chest pain, trouble breathing Increase in blood pressure Infection--fever, chills, cough, sore throat, wounds that don't heal, pain or trouble when passing urine, general feeling of discomfort or being unwell Low blood pressure--dizziness, feeling faint or lightheaded, blurry vision Low red blood cell level--unusual weakness or fatigue, dizziness, headache, trouble breathing Painful swelling, warmth, or redness of the skin, blisters or sores at the infusion site Pain, tingling, or numbness in  the hands or feet Slow heartbeat--dizziness, feeling faint or lightheaded, confusion, trouble breathing, unusual weakness or fatigue Unusual bruising or bleeding Side effects that usually do not require medical attention (report to your care team if they continue or are bothersome): Diarrhea Hair loss Joint pain Loss of appetite Muscle pain Nausea Vomiting This list may not describe all possible side effects. Call your doctor for medical advice about side effects. You may report side effects to FDA at 1-800-FDA-1088. Where should I keep my  medication? This medication is given in a hospital or clinic. It will not be stored at home. NOTE: This sheet is a summary. It may not cover all possible information. If you have questions about this medicine, talk to your doctor, pharmacist, or health care provider.  2024 Elsevier/Gold Standard (2021-10-18 00:00:00)

## 2023-03-06 NOTE — Assessment & Plan Note (Addendum)
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, DCIS posterior margin positive, lymphovascular invasion not identified, ER 0%, PR 0%, Ki-67 85%, HER2 0 by IHC Margin excision: Posterior margin: Scattered foci of high-grade DCIS, 0/2 lymph nodes negative, final margins negative  (10/02/2022: Initial biopsy was DCIS ER 30% PR 2%)   Treatment plan: Adjuvant chemotherapy with dose dense Adriamycin and Cytoxan followed by Taxol and carboplatin Adjuvant radiation therapy ------------------------------------------------- Current treatment: Completed 4 cycles of dose dense Adriamycin and Cytoxan, today cycle 2 Taxol Chemo toxicities: Fatigue Insomnia Anxiety Hospitalization 01/21/2023 for nasal burning, chest discomfort leukocytosis.    Chemo-induced mucositis/oral candidiasis Nausea: I added Aloxi to today's treatment. Reduce the dosage of Benadryl. Hospitalization: 02/17/2023-02/21/2023: COVID 19 infection with pneumonia  I reduce the dosage of Taxol today to 65 mg/m. Monitoring her white blood cell count. Return to clinic weekly for Taxol and every other week for follow-up with me.

## 2023-03-07 ENCOUNTER — Encounter (HOSPITAL_BASED_OUTPATIENT_CLINIC_OR_DEPARTMENT_OTHER): Payer: Self-pay | Admitting: Obstetrics & Gynecology

## 2023-03-08 ENCOUNTER — Ambulatory Visit: Payer: Self-pay | Admitting: Licensed Clinical Social Worker

## 2023-03-09 ENCOUNTER — Encounter: Payer: Self-pay | Admitting: Hematology and Oncology

## 2023-03-09 ENCOUNTER — Telehealth (HOSPITAL_BASED_OUTPATIENT_CLINIC_OR_DEPARTMENT_OTHER): Payer: Self-pay | Admitting: *Deleted

## 2023-03-09 NOTE — Telephone Encounter (Signed)
Called patient and left a message to call the office back to schedule the appointment .  

## 2023-03-09 NOTE — Patient Outreach (Signed)
Care Coordination   Follow Up Visit Note   03/08/2023 Name: Laurie Bell MRN: [id number] DOB: Aug 08, 1980  Laurie Bell is a 42 y.o. year old female who sees Hoy Register, MD for primary care. I spoke with  [name] by phone today.  What matters to the patients health and wellness today?  Symptom Management    Goals Addressed             This Visit's Progress    Obtain Supportive Resources-Finances   On track    Activities and task to complete in order to accomplish goals.   Keep all upcoming appointments discussed today Continue with compliance of taking medication prescribed by Doctor Implement healthy coping skills discussed to assist with management of symptoms Follow up with Department of Social Services          SDOH assessments and interventions completed:  No     Care Coordination Interventions:  Yes, provided  Interventions Today    Flowsheet Row Most Recent Value  Chronic Disease   Chronic disease during today's visit Other  [Breast Cancer, MDD, GAD]  General Interventions   General Interventions Discussed/Reviewed General Interventions Reviewed  Mental Health Interventions   Mental Health Discussed/Reviewed Mental Health Reviewed       Follow up plan: Follow up call scheduled for 2 weeks    Encounter Outcome:  Patient Visit Completed   Jenel Lucks, MSW, LCSW [facility] Care Management [facility]  [facility] [email or web address] Phone [phone] 7:41 AM

## 2023-03-09 NOTE — Patient Instructions (Signed)
Visit Information  Thank you for taking time to visit with me today. Please don't hesitate to contact me if I can be of assistance to you.   Following are the goals we discussed today:   Goals Addressed             This Visit's Progress    Obtain Supportive Resources-Finances   On track    Activities and task to complete in order to accomplish goals.   Keep all upcoming appointments discussed today Continue with compliance of taking medication prescribed by Doctor Implement healthy coping skills discussed to assist with management of symptoms Follow up with Department of Social Services          Our next appointment is by telephone on 10/10 at 10:30 AM  Please call the care guide team at [phone] if you need to cancel or reschedule your appointment.   If you are experiencing a Mental Health or Behavioral Health Crisis or need someone to talk to, please call the Suicide and Crisis Lifeline: 988 call 911   Patient verbalizes understanding of instructions and care plan provided today and agrees to view in MyChart. Active MyChart status and patient understanding of how to access instructions and care plan via MyChart confirmed with patient.     Jenel Lucks, MSW, LCSW [facility] Care Management [place]  [facility] [email or web address] Phone [phone] 7:42 AM

## 2023-03-12 ENCOUNTER — Encounter (HOSPITAL_BASED_OUTPATIENT_CLINIC_OR_DEPARTMENT_OTHER): Payer: Self-pay

## 2023-03-12 ENCOUNTER — Ambulatory Visit (INDEPENDENT_AMBULATORY_CARE_PROVIDER_SITE_OTHER): Payer: 59

## 2023-03-12 ENCOUNTER — Other Ambulatory Visit (HOSPITAL_COMMUNITY)
Admission: RE | Admit: 2023-03-12 | Discharge: 2023-03-12 | Disposition: A | Discharge status: Home / Self Care | Payer: 59 | Source: Ambulatory Visit | Attending: Obstetrics & Gynecology | Admitting: Obstetrics & Gynecology

## 2023-03-12 VITALS — BP 126/59 | HR 97 | Ht 66.0 in | Wt 216.8 lb

## 2023-03-12 DIAGNOSIS — N898 Other specified noninflammatory disorders of vagina: Secondary | ICD-10-CM

## 2023-03-12 NOTE — Progress Notes (Signed)
Patient came in today with complaints of vaginal itching and vaginal discharge. Aptima self swab was obtained and sent for evaluation. tbw

## 2023-03-13 ENCOUNTER — Encounter: Payer: Self-pay | Admitting: *Deleted

## 2023-03-13 ENCOUNTER — Encounter: Payer: Self-pay | Admitting: Hematology and Oncology

## 2023-03-13 LAB — CERVICOVAGINAL ANCILLARY ONLY
Bacterial Vaginitis (gardnerella): POSITIVE — AB
Candida Glabrata: NEGATIVE
Candida Vaginitis: NEGATIVE
Comment: NEGATIVE
Comment: NEGATIVE
Comment: NEGATIVE

## 2023-03-13 MED FILL — Dexamethasone Sodium Phosphate Inj 100 MG/10ML: INTRAMUSCULAR | Qty: 1 | Status: AC

## 2023-03-14 ENCOUNTER — Other Ambulatory Visit: Payer: Self-pay | Admitting: *Deleted

## 2023-03-14 ENCOUNTER — Encounter: Payer: Self-pay | Admitting: Hematology and Oncology

## 2023-03-14 ENCOUNTER — Inpatient Hospital Stay: Payer: Medicaid Other | Attending: Hematology and Oncology

## 2023-03-14 ENCOUNTER — Inpatient Hospital Stay: Payer: Medicaid Other

## 2023-03-14 ENCOUNTER — Encounter: Payer: Self-pay | Admitting: Adult Health

## 2023-03-14 ENCOUNTER — Other Ambulatory Visit (HOSPITAL_BASED_OUTPATIENT_CLINIC_OR_DEPARTMENT_OTHER): Payer: Self-pay | Admitting: *Deleted

## 2023-03-14 ENCOUNTER — Inpatient Hospital Stay (HOSPITAL_BASED_OUTPATIENT_CLINIC_OR_DEPARTMENT_OTHER): Payer: Medicaid Other | Admitting: Adult Health

## 2023-03-14 VITALS — BP 125/93 | HR 96 | Resp 18

## 2023-03-14 DIAGNOSIS — C50411 Malignant neoplasm of upper-outer quadrant of right female breast: Secondary | ICD-10-CM | POA: Insufficient documentation

## 2023-03-14 DIAGNOSIS — Z803 Family history of malignant neoplasm of breast: Secondary | ICD-10-CM | POA: Diagnosis not present

## 2023-03-14 DIAGNOSIS — G629 Polyneuropathy, unspecified: Secondary | ICD-10-CM | POA: Insufficient documentation

## 2023-03-14 DIAGNOSIS — Z95828 Presence of other vascular implants and grafts: Secondary | ICD-10-CM

## 2023-03-14 DIAGNOSIS — Z79899 Other long term (current) drug therapy: Secondary | ICD-10-CM | POA: Insufficient documentation

## 2023-03-14 DIAGNOSIS — Z171 Estrogen receptor negative status [ER-]: Secondary | ICD-10-CM

## 2023-03-14 DIAGNOSIS — Z800 Family history of malignant neoplasm of digestive organs: Secondary | ICD-10-CM | POA: Insufficient documentation

## 2023-03-14 DIAGNOSIS — Z5111 Encounter for antineoplastic chemotherapy: Secondary | ICD-10-CM | POA: Insufficient documentation

## 2023-03-14 LAB — CBC WITH DIFFERENTIAL (CANCER CENTER ONLY)
Abs Immature Granulocytes: 0.02 10*3/uL (ref 0.00–0.07)
Basophils Absolute: 0 10*3/uL (ref 0.0–0.1)
Basophils Relative: 1 %
Eosinophils Absolute: 0.1 10*3/uL (ref 0.0–0.5)
Eosinophils Relative: 2 %
HCT: 31.5 % — ABNORMAL LOW (ref 36.0–46.0)
Hemoglobin: 10.7 g/dL — ABNORMAL LOW (ref 12.0–15.0)
Immature Granulocytes: 1 %
Lymphocytes Relative: 41 %
Lymphs Abs: 1.5 10*3/uL (ref 0.7–4.0)
MCH: 29.8 pg (ref 26.0–34.0)
MCHC: 34 g/dL (ref 30.0–36.0)
MCV: 87.7 fL (ref 80.0–100.0)
Monocytes Absolute: 0.5 10*3/uL (ref 0.1–1.0)
Monocytes Relative: 14 %
Neutro Abs: 1.5 10*3/uL — ABNORMAL LOW (ref 1.7–7.7)
Neutrophils Relative %: 41 %
Platelet Count: 262 10*3/uL (ref 150–400)
RBC: 3.59 MIL/uL — ABNORMAL LOW (ref 3.87–5.11)
RDW: 16.4 % — ABNORMAL HIGH (ref 11.5–15.5)
Smear Review: NORMAL
WBC Count: 3.6 10*3/uL — ABNORMAL LOW (ref 4.0–10.5)
nRBC: 0 % (ref 0.0–0.2)

## 2023-03-14 LAB — CMP (CANCER CENTER ONLY)
ALT: 71 U/L — ABNORMAL HIGH (ref 0–44)
AST: 46 U/L — ABNORMAL HIGH (ref 15–41)
Albumin: 3.8 g/dL (ref 3.5–5.0)
Alkaline Phosphatase: 111 U/L (ref 38–126)
Anion gap: 6 (ref 5–15)
BUN: 8 mg/dL (ref 6–20)
CO2: 29 mmol/L (ref 22–32)
Calcium: 9 mg/dL (ref 8.9–10.3)
Chloride: 106 mmol/L (ref 98–111)
Creatinine: 0.74 mg/dL (ref 0.44–1.00)
GFR, Estimated: >60 mL/min (ref >60)
Glucose, Bld: 122 mg/dL — ABNORMAL HIGH (ref 70–99)
Potassium: 3.3 mmol/L — ABNORMAL LOW (ref 3.5–5.1)
Sodium: 141 mmol/L (ref 135–145)
Total Bilirubin: 0.3 mg/dL (ref 0.3–1.2)
Total Protein: 6.7 g/dL (ref 6.5–8.1)

## 2023-03-14 MED ORDER — SODIUM CHLORIDE 0.9 % IV SOLN: Freq: Once | INTRAVENOUS | Status: AC

## 2023-03-14 MED ORDER — METRONIDAZOLE 0.75 % VA GEL: 1.0000 | Freq: Every day | VAGINAL | 0 refills | Status: DC

## 2023-03-14 MED ORDER — DIPHENHYDRAMINE HCL 50 MG/ML IJ SOLN
25.0000 mg | Freq: Once | INTRAMUSCULAR | Status: AC
  Administered 2023-03-14: 25 mg via INTRAVENOUS
  Filled 2023-03-14: qty 1

## 2023-03-14 MED ORDER — SODIUM CHLORIDE 0.9% FLUSH
10.0000 mL | INTRAVENOUS | Status: DC | PRN
  Administered 2023-03-14: 10 mL

## 2023-03-14 MED ORDER — PALONOSETRON HCL INJECTION 0.25 MG/5ML
0.2500 mg | Freq: Once | INTRAVENOUS | Status: AC
  Administered 2023-03-14: 0.25 mg via INTRAVENOUS
  Filled 2023-03-14: qty 5

## 2023-03-14 MED ORDER — HEPARIN SOD (PORK) LOCK FLUSH 100 UNIT/ML IV SOLN
500.0000 [IU] | Freq: Once | INTRAVENOUS | Status: AC | PRN
  Administered 2023-03-14: 500 [IU]

## 2023-03-14 MED ORDER — PROCHLORPERAZINE MALEATE 10 MG PO TABS: 10.0000 mg | ORAL_TABLET | Freq: Four times a day (QID) | ORAL | 1 refills | Status: DC | PRN

## 2023-03-14 MED ORDER — SODIUM CHLORIDE 0.9 % IV SOLN
10.0000 mg | Freq: Once | INTRAVENOUS | Status: AC
  Administered 2023-03-14: 10 mg via INTRAVENOUS
  Filled 2023-03-14: qty 10

## 2023-03-14 MED ORDER — SODIUM CHLORIDE 0.9 % IV SOLN
65.0000 mg/m2 | Freq: Once | INTRAVENOUS | Status: AC
  Administered 2023-03-14: 138 mg via INTRAVENOUS
  Filled 2023-03-14: qty 23

## 2023-03-14 MED ORDER — FAMOTIDINE IN NACL 20-0.9 MG/50ML-% IV SOLN
20.0000 mg | Freq: Once | INTRAVENOUS | Status: AC
  Administered 2023-03-14: 20 mg via INTRAVENOUS
  Filled 2023-03-14: qty 50

## 2023-03-14 MED ORDER — SODIUM CHLORIDE 0.9% FLUSH
10.0000 mL | Freq: Once | INTRAVENOUS | Status: AC
  Administered 2023-03-14: 10 mL

## 2023-03-14 NOTE — Patient Instructions (Signed)
[place] [facility]  Discharge Instructions: Thank you for choosing [facility] to provide your oncology and hematology care.   If you have a lab appointment with the Cancer Center, please go directly to the Cancer Center and check in at the registration area.   Wear comfortable clothing and clothing appropriate for easy access to any Portacath or PICC line.   We strive to give you quality time with your provider. You may need to reschedule your appointment if you arrive late (15 or more minutes).  Arriving late affects you and other patients whose appointments are after yours.  Also, if you miss three or more appointments without notifying the office, you may be dismissed from the clinic at the provider's discretion.      For prescription refill requests, have your pharmacy contact our office and allow 72 hours for refills to be completed.    Today you received the following chemotherapy and/or immunotherapy agents Taxol      To help prevent nausea and vomiting after your treatment, we encourage you to take your nausea medication as directed.  BELOW ARE SYMPTOMS THAT SHOULD BE REPORTED IMMEDIATELY: *FEVER GREATER THAN 100.4 F (38 C) OR HIGHER *CHILLS OR SWEATING *NAUSEA AND VOMITING THAT IS NOT CONTROLLED WITH YOUR NAUSEA MEDICATION *UNUSUAL SHORTNESS OF BREATH *UNUSUAL BRUISING OR BLEEDING *URINARY PROBLEMS (pain or burning when urinating, or frequent urination) *BOWEL PROBLEMS (unusual diarrhea, constipation, pain near the anus) TENDERNESS IN MOUTH AND THROAT WITH OR WITHOUT PRESENCE OF ULCERS (sore throat, sores in mouth, or a toothache) UNUSUAL RASH, SWELLING OR PAIN  UNUSUAL VAGINAL DISCHARGE OR ITCHING   Items with * indicate a potential emergency and should be followed up as soon as possible or go to the Emergency Department if any problems should occur.  Please show the CHEMOTHERAPY ALERT CARD or IMMUNOTHERAPY ALERT CARD at check-in  to the Emergency Department and triage nurse.  Should you have questions after your visit or need to cancel or reschedule your appointment, please contact [place] [facility]  Dept: [phone]  and follow the prompts.  Office hours are 8:00 a.m. to 4:30 p.m. Monday - Friday. Please note that voicemails left after 4:00 p.m. may not be returned until the following business day.  We are closed weekends and major holidays. You have access to a nurse at all times for urgent questions. Please call the main number to the clinic Dept: [phone] and follow the prompts.   For any non-urgent questions, you may also contact your provider using MyChart. We now offer e-Visits for anyone 18 and older to request care online for non-urgent symptoms. For details visit [email or web address].   Also download the MyChart app! Go to the app store, search "MyChart", open the app, select [place], and log in with your MyChart username and password.   

## 2023-03-14 NOTE — Assessment & Plan Note (Signed)
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, DCIS posterior margin positive, lymphovascular invasion not identified, ER 0%, PR 0%, Ki-67 85%, HER2 0 by IHC Margin excision: Posterior margin: Scattered foci of high-grade DCIS, 0/2 lymph nodes negative, final margins negative  (10/02/2022: Initial biopsy was DCIS ER 30% PR 2%)   Treatment plan: Adjuvant chemotherapy with dose dense Adriamycin and Cytoxan followed by Taxol and carboplatin Adjuvant radiation therapy ------------------------------------------------- Current treatment: Completed 4 cycles of dose dense Adriamycin and Cytoxan, today cycle 3 Taxol Chemo toxicities: Fatigue-managed with energy conservation Insomnia Anxiety Hospitalization 01/21/2023 for nasal burning, chest discomfort leukocytosis.    Chemo-induced mucositis/oral candidiasis-resolved Nausea: Aloxi prior to each treatment Reduce the dosage of Benadryl. Hospitalization: 02/17/2023-02/21/2023: COVID 19 infection with pneumonia Leukopenia: Her white blood cells today are 3.6, her neutrophil count is still being read by the lab.  She will continue on Taxol at the dose reduced amount of 65 mg/m. 10.  Hypokalemia: Her potassium level is 3.3.  She is going to send a MyChart message with the prescription details for the potassium supplementation prior to taking it once she gets home today.   RTC weekly for labs and Taxol, see Dr. Pamelia Hoit or Oncology APP prior to every other treatment.

## 2023-03-14 NOTE — Progress Notes (Signed)
[facility] Cancer Follow up:    Laurie Register, MD [address] [place] [address] [place] [address]   DIAGNOSIS:  Cancer Staging  Malignant neoplasm of upper-outer quadrant of right breast in female, estrogen receptor negative (HCC) Staging form: Breast, AJCC 8th Edition - Clinical: Stage 0 (cTis (DCIS), cN0, cM0, G3, ER+, PR+, HER2: Not Assessed) - Signed by Serena Croissant, MD on 10/11/2022 Stage prefix: Initial diagnosis Histologic grading system: 3 grade system - Pathologic: Stage IIA (pT1c, pN1, cM0, G3, ER-, PR-, HER2-) - Unsigned Histologic grading system: 3 grade system   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, ER 30% weak, PR 2%   10/11/2022 Cancer Staging   Staging form: Breast, AJCC 8th Edition - Clinical: Stage 0 (cTis (DCIS), cN0, cM0, G3, ER+, PR+, HER2: Not Assessed) - Signed by Serena Croissant, MD on 10/11/2022 Stage prefix: Initial diagnosis Histologic grading system: 3 grade system   10/11/2022 Genetic Testing   Declined hereditary cancer genetic testing    11/13/2022 Surgery   Right lumpectomy: Grade 3 IDC, 2 cm in size inferior and posterior margins are positive, DCIS posterior margin positive, lymphovascular invasion not identified, ER 0%, PR 0%, Ki-67 85%, HER2 0 by [facility]   12/01/2022 Surgery   Margin excision: Posterior margin: Scattered foci of high-grade DCIS, 0/2 lymph nodes negative   01/01/2023 -  Chemotherapy   Patient is on Treatment Plan : BREAST ADJUVANT DOSE DENSE AC q14d / PACLitaxel q7d       CURRENT THERAPY: Weekly Taxol  INTERVAL HISTORY: Laurie Bell 42 y.o. female returns for f/u and evaluation prior to her third weekly dose of Taxol.   She is tolerating this moderately well.  She receives it at a dose reduction of 65 mg/m2 due to leukopenia and increased risk for neutropenia.  She tolerated the dose reduction better than full dose regarding her level of fatigue and nausea.  She denies any constant peripheral neuropathy.  Her potassium level is 3.3 today.  She says she has a potassium pill she is supposed to take but forgets to take it.  She is not on a diuretic.  She cannot remember the dosage of this medication.   Patient Active Problem List   Diagnosis Date Noted   Sepsis (HCC) 02/17/2023   COVID-19 virus infection 02/17/2023   Lower extremity pain, anterior, right 01/22/2023   SIRS (systemic inflammatory response syndrome) (HCC) 01/21/2023   Port-A-Cath in place 01/01/2023   Malignant neoplasm of upper-outer quadrant of right breast in female, estrogen receptor negative (HCC) 10/09/2022   Multinodular goiter 03/22/2022   Hyperthyroidism 03/22/2022   MDD (major depressive disorder), recurrent episode, moderate (HCC) 11/03/2021   GAD (generalized anxiety disorder) 11/03/2021   Neck pain 10/28/2021   Cervical high risk HPV (human papillomavirus) test positive 10/17/2021   Irregular heart rhythm 04/15/2021   Numbness on left side 04/15/2021   Dysphagia 03/17/2021   Hypomagnesemia 02/23/2021   Gastroesophageal reflux disease    Hypokalemia 08/24/2020   Atypical chest pain 08/24/2020   Nausea and vomiting 08/24/2020   Hypocalcemia 08/24/2020   DUB (dysfunctional uterine bleeding) 01/12/2017   Fibroid, uterine 01/12/2017    is allergic to latex and tramadol.  MEDICAL HISTORY: Past Medical History:  Diagnosis Date  Anxiety    Arthritis    Breast cancer (HCC)    Chlamydia contact, treated    GERD (gastroesophageal reflux disease)    Hyperthyroidism     SURGICAL HISTORY: Past Surgical History:  Procedure Laterality Date   AXILLARY SENTINEL NODE BIOPSY Right 12/01/2022   Procedure: RIGHT AXILLARY SENTINEL  NODE BIOPSY;  Surgeon: Abigail Miyamoto, MD;  Location: [facility];  Service: General;  Laterality: Right;   BREAST BIOPSY Right 10/02/2022   MM RT BREAST BX W LOC DEV 1ST LESION IMAGE BX SPEC STEREO GUIDE 10/02/2022 [facility]   BREAST BIOPSY  11/10/2022   MM RT RADIOACTIVE SEED LOC MAMMO GUIDE 11/10/2022 [facility]   BREAST LUMPECTOMY WITH RADIOACTIVE SEED LOCALIZATION Right 11/13/2022   Procedure: RIGHT BREAST LUMPECTOMY WITH RADIOACTIVE SEED LOCALIZATION;  Surgeon: Abigail Miyamoto, MD;  Location: [facility];  Service: General;  Laterality: Right;   ENDOMETRIAL ABLATION N/A 04/25/2017   Procedure: ENDOMETRIAL ABLATION With NOVASURE;  Surgeon: Adam Phenix, MD;  Location: [facility];  Service: Gynecology;  Laterality: N/A;   ESOPHAGEAL MANOMETRY N/A 07/27/2021   Procedure: ESOPHAGEAL MANOMETRY (EM);  Surgeon: Imogene Burn, MD;  Location: [facility];  Service: Gastroenterology;  Laterality: N/A;   PORTACATH PLACEMENT N/A 12/01/2022   Procedure: INSERTION PORT-A-CATH WITH ULTRASOUND GUIDANCE;  Surgeon: Abigail Miyamoto, MD;  Location: [facility];  Service: General;  Laterality: N/A;   RE-EXCISION OF BREAST CANCER,SUPERIOR MARGINS Right 12/01/2022   Procedure: RE-EXCISION OF RIGHT BREAST CANCER;  Surgeon: Abigail Miyamoto, MD;  Location: [facility] OR;  Service: General;  Laterality: Right;   TUBAL LIGATION  2003    SOCIAL HISTORY: Social History   Socioeconomic History   Marital status: Single    Spouse name: Not on file   Number of children: 2   Years of education: Not on file   Highest education level: Not on file  Occupational History   Occupation: Event organiser: [employer]  Tobacco Use   Smoking status: Never   Smokeless tobacco: Never  Vaping Use   Vaping status: Never Used  Substance and Sexual Activity   Alcohol use: No   Drug use: Not Currently    Types: Marijuana    Comment: quit 2018   Sexual activity: Not Currently    Birth  control/protection: Surgical  Other Topics Concern   Not on file  Social History Narrative   ** Merged History Encounter **       Right Handed  Lives in a one story home    Social Determinants of Health   Financial Resource Strain: Medium Risk (11/03/2021)   Overall Financial Resource Strain (CARDIA)    Difficulty of Paying Living Expenses: Somewhat hard  Food Insecurity: No Food Insecurity (02/23/2023)   Hunger Vital Sign    Worried About Running Out of Food in the Last Year: Never true    Ran Out of Food in the Last Year: Never true  Transportation Needs: No Transportation Needs (02/23/2023)   PRAPARE - Administrator, Civil Service (Medical): No    Lack of Transportation (Non-Medical): No  Physical Activity: Sufficiently Active (11/03/2021)   Exercise Vital Sign    Days of Exercise per Week: 3 days    Minutes of Exercise per Session: 50 min  Stress: Stress Concern Present (11/03/2021)   [employer] of Occupational Health - Occupational Stress Questionnaire    Feeling of Stress : Very much  Social Connections: Moderately Isolated (11/03/2021)  Social Advertising account executive [NHANES]    Frequency of Communication with Friends and Family: Once a week    Frequency of Social Gatherings with Friends and Family: Once a week    Attends Religious Services: More than 4 times per year    Active Member of [employer] or Organizations: No    Attends Banker Meetings: Never    Marital Status: Living with partner  Intimate Partner Violence: Not At Risk (02/17/2023)   Humiliation, Afraid, Rape, and Kick questionnaire    Fear of Current or Ex-Partner: No    Emotionally Abused: No    Physically Abused: No    Sexually Abused: No    FAMILY HISTORY: Family History  Problem Relation Age of Onset   Hypertension Mother    Hypertension Maternal Aunt    Breast cancer Maternal Grandmother        dx > 50   Stomach cancer Maternal Grandfather        dx > 50    Breast cancer Paternal Grandmother        dx > 50   Esophageal cancer Neg Hx    Rectal cancer Neg Hx    Colon cancer Neg Hx     Review of Systems  Constitutional:  Positive for fatigue. Negative for appetite change, chills, fever and unexpected weight change.  HENT:   Negative for hearing loss, lump/mass and trouble swallowing.   Eyes:  Negative for eye problems and icterus.  Respiratory:  Negative for chest tightness, cough and shortness of breath.   Cardiovascular:  Negative for chest pain, leg swelling and palpitations.  Gastrointestinal:  Negative for abdominal distention, abdominal pain, constipation, diarrhea, nausea and vomiting.  Endocrine: Negative for hot flashes.  Genitourinary:  Negative for difficulty urinating.   Musculoskeletal:  Negative for arthralgias.  Skin:  Negative for itching and rash.  Neurological:  Negative for dizziness, extremity weakness, headaches and numbness.  Hematological:  Negative for adenopathy. Does not bruise/bleed easily.  Psychiatric/Behavioral:  Negative for depression. The patient is not nervous/anxious.       PHYSICAL EXAMINATION   Onc Performance Status - 03/14/23 1033       ECOG Perf Status   ECOG Perf Status Restricted in physically strenuous activity but ambulatory and able to carry out work of a light or sedentary nature, e.g., light house work, office work      KPS SCALE   KPS % SCORE Able to carry on normal activity, minor s/s of disease             Vitals:   03/14/23 1022  BP: (!) 137/91  Pulse: (!) 103  Resp: 18  Temp: 97.7 F (36.5 C)  SpO2: 100%    Physical Exam Constitutional:      General: She is not in acute distress.    Appearance: Normal appearance. She is not toxic-appearing.  HENT:     Head: Normocephalic and atraumatic.     Mouth/Throat:     Mouth: Mucous membranes are moist.     Pharynx: Oropharynx is clear. No oropharyngeal exudate or posterior oropharyngeal erythema.  Eyes:     General: No  scleral icterus. Cardiovascular:     Rate and Rhythm: Normal rate and regular rhythm.     Pulses: Normal pulses.     Heart sounds: Normal heart sounds.  Pulmonary:     Effort: Pulmonary effort is normal.     Breath sounds: Normal breath sounds.  Abdominal:     General: Abdomen  is flat. Bowel sounds are normal. There is no distension.     Palpations: Abdomen is soft.     Tenderness: There is no abdominal tenderness.  Musculoskeletal:        General: No swelling.     Cervical back: Neck supple.  Lymphadenopathy:     Cervical: No cervical adenopathy.  Skin:    General: Skin is warm and dry.     Findings: No rash.  Neurological:     General: No focal deficit present.     Mental Status: She is alert.  Psychiatric:        Mood and Affect: Mood normal.        Behavior: Behavior normal.     LABORATORY DATA:  CBC    Component Value Date/Time   WBC 3.6 (L) 03/14/2023 0950   WBC 7.6 02/21/2023 0823   RBC 3.59 (L) 03/14/2023 0950   HGB 10.7 (L) 03/14/2023 0950   HCT 31.5 (L) 03/14/2023 0950   PLT 262 03/14/2023 0950   MCV 87.7 03/14/2023 0950   MCH 29.8 03/14/2023 0950   MCHC 34.0 03/14/2023 0950   RDW 16.4 (H) 03/14/2023 0950   LYMPHSABS PENDING 03/14/2023 0950   MONOABS PENDING 03/14/2023 0950   EOSABS PENDING 03/14/2023 0950   BASOSABS PENDING 03/14/2023 0950    CMP     Component Value Date/Time   NA 141 03/14/2023 0950   NA 138 01/18/2022 1512   K 3.3 (L) 03/14/2023 0950   CL 106 03/14/2023 0950   CO2 29 03/14/2023 0950   GLUCOSE 122 (H) 03/14/2023 0950   BUN 8 03/14/2023 0950   BUN 9 01/18/2022 1512   CREATININE 0.74 03/14/2023 0950   CALCIUM 9.0 03/14/2023 0950   PROT 6.7 03/14/2023 0950   PROT 6.9 04/15/2021 0942   ALBUMIN 3.8 03/14/2023 0950   ALBUMIN 4.3 04/15/2021 0942   AST 46 (H) 03/14/2023 0950   ALT 71 (H) 03/14/2023 0950   ALKPHOS 111 03/14/2023 0950   BILITOT 0.3 03/14/2023 0950   GFRNONAA >60 03/14/2023 0950   GFRAA >60 02/01/2020 2233     ASSESSMENT and THERAPY PLAN:   Malignant neoplasm of upper-outer quadrant of right breast in female, estrogen receptor negative (HCC) 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, DCIS posterior margin positive, lymphovascular invasion not identified, ER 0%, PR 0%, Ki-67 85%, HER2 0 by IHC Margin excision: Posterior margin: Scattered foci of high-grade DCIS, 0/2 lymph nodes negative, final margins negative  (10/02/2022: Initial biopsy was DCIS ER 30% PR 2%)   Treatment plan: Adjuvant chemotherapy with dose dense Adriamycin and Cytoxan followed by Taxol and carboplatin Adjuvant radiation therapy ------------------------------------------------- Current treatment: Completed 4 cycles of dose dense Adriamycin and Cytoxan, today cycle 3 Taxol Chemo toxicities: Fatigue-managed with energy conservation Insomnia Anxiety Hospitalization 01/21/2023 for nasal burning, chest discomfort leukocytosis.    Chemo-induced mucositis/oral candidiasis-resolved Nausea: Aloxi prior to each treatment Reduce the dosage of Benadryl. Hospitalization: 02/17/2023-02/21/2023: COVID 19 infection with pneumonia Leukopenia: Her white blood cells today are 3.6, her neutrophil count is still being read by the lab.  She will continue on Taxol at the dose reduced amount of 65 mg/m. 10.  Hypokalemia: Her potassium level is 3.3.  She is going to send a MyChart message with the prescription details for the potassium supplementation prior to taking it once she gets home today.   RTC weekly for labs and Taxol, see Dr. Pamelia Hoit or Oncology APP prior to  every other treatment.     All questions were answered. The patient knows to call the clinic with any problems, questions or concerns. We can certainly see the patient much sooner if necessary.  Total encounter time:20 minutes*in face-to-face visit time, chart review, lab review, care coordination, order entry, and documentation  of the encounter time.    Lillard Anes, NP 03/14/23 11:06 AM Medical Oncology and Hematology [facility] [address] [place], [place] [address] Tel. [phone]    Fax. [phone]  *Total Encounter Time as defined by the Centers for Medicare and Medicaid Services includes, in addition to the face-to-face time of a patient visit (documented in the note above) non-face-to-face time: obtaining and reviewing outside history, ordering and reviewing medications, tests or procedures, care coordination (communications with other health care professionals or caregivers) and documentation in the medical record.

## 2023-03-18 ENCOUNTER — Other Ambulatory Visit: Payer: Self-pay | Admitting: Family Medicine

## 2023-03-18 DIAGNOSIS — R519 Headache, unspecified: Secondary | ICD-10-CM

## 2023-03-19 MED FILL — Dexamethasone Sodium Phosphate Inj 100 MG/10ML: INTRAMUSCULAR | Qty: 1 | Status: AC

## 2023-03-19 NOTE — Telephone Encounter (Signed)
Requested medications are due for refill today.  yes  Requested medications are on the active medications list.  yes  Last refill. 01/19/2023 #60 1 rf  Future visit scheduled.   yes  Notes to clinic.  Abnormal labs.    Requested Prescriptions  Pending Prescriptions Disp Refills   naproxen (NAPROSYN) 500 MG tablet [Pharmacy Med Name: NAPROXEN 500 MG TABLET] 60 tablet 1    Sig: TAKE 1 TABLET BY MOUTH TWICE A DAY AS NEEDED FOR PAIN     Analgesics:  NSAIDS Failed - 03/18/2023  9:55 AM      Failed - Manual Review: Labs are only required if the patient has taken medication for more than 8 weeks.      Failed - HGB in normal range and within 360 days    Hemoglobin  Date Value Ref Range Status  03/14/2023 10.7 (L) 12.0 - 15.0 g/dL Final         Failed - HCT in normal range and within 360 days    HCT  Date Value Ref Range Status  03/14/2023 31.5 (L) 36.0 - 46.0 % Final         Passed - Cr in normal range and within 360 days    Creatinine  Date Value Ref Range Status  03/14/2023 0.74 0.44 - 1.00 mg/dL Final         Passed - PLT in normal range and within 360 days    Platelet Count  Date Value Ref Range Status  03/14/2023 262 150 - 400 K/uL Final         Passed - eGFR is 30 or above and within 360 days    GFR calc Af Amer  Date Value Ref Range Status  02/01/2020 >60 >60 mL/min Final   GFR, Estimated  Date Value Ref Range Status  03/14/2023 >60 >60 mL/min Final    Comment:    (NOTE) Calculated using the CKD-EPI Creatinine Equation (2021)    eGFR  Date Value Ref Range Status  01/18/2022 103 >59 mL/min/1.73 Final         Passed - Patient is not pregnant      Passed - Valid encounter within last 12 months    Recent Outpatient Visits           3 months ago Malignant neoplasm of upper-outer quadrant of right breast in female, estrogen receptor negative (HCC)   [facility] [place], [place], MD   8 months ago [facility]   [facility] [place], Marylene Land M, [place]   12 months ago Hypokalemia   [facility] Marcine Matar, MD   1 year ago Cervical radiculopathy   [facility] Hoy Register, MD   1 year ago Cervical radiculopathy   [facility] Hoy Register, MD       Future Appointments             In 1 week Lo, Toma Aran, CNM [facility] for [facility] at [place], [place]   In 2 months Hoy Register, MD [facility]

## 2023-03-20 ENCOUNTER — Inpatient Hospital Stay: Payer: Medicaid Other

## 2023-03-20 ENCOUNTER — Inpatient Hospital Stay (HOSPITAL_BASED_OUTPATIENT_CLINIC_OR_DEPARTMENT_OTHER): Payer: Medicaid Other | Admitting: Hematology and Oncology

## 2023-03-20 VITALS — BP 117/73 | HR 90 | Temp 97.5°F | Resp 18 | Ht 66.0 in | Wt 215.8 lb

## 2023-03-20 DIAGNOSIS — Z171 Estrogen receptor negative status [ER-]: Secondary | ICD-10-CM

## 2023-03-20 DIAGNOSIS — C50411 Malignant neoplasm of upper-outer quadrant of right female breast: Secondary | ICD-10-CM

## 2023-03-20 DIAGNOSIS — Z95828 Presence of other vascular implants and grafts: Secondary | ICD-10-CM

## 2023-03-20 DIAGNOSIS — Z5111 Encounter for antineoplastic chemotherapy: Secondary | ICD-10-CM | POA: Diagnosis not present

## 2023-03-20 LAB — CMP (CANCER CENTER ONLY)
ALT: 55 U/L — ABNORMAL HIGH (ref 0–44)
AST: 29 U/L (ref 15–41)
Albumin: 4.1 g/dL (ref 3.5–5.0)
Alkaline Phosphatase: 120 U/L (ref 38–126)
Anion gap: 5 (ref 5–15)
BUN: 9 mg/dL (ref 6–20)
CO2: 28 mmol/L (ref 22–32)
Calcium: 9.3 mg/dL (ref 8.9–10.3)
Chloride: 106 mmol/L (ref 98–111)
Creatinine: 0.75 mg/dL (ref 0.44–1.00)
GFR, Estimated: >60 mL/min (ref >60)
Glucose, Bld: 103 mg/dL — ABNORMAL HIGH (ref 70–99)
Potassium: 3.8 mmol/L (ref 3.5–5.1)
Sodium: 139 mmol/L (ref 135–145)
Total Bilirubin: 0.4 mg/dL (ref 0.3–1.2)
Total Protein: 7.1 g/dL (ref 6.5–8.1)

## 2023-03-20 LAB — CBC WITH DIFFERENTIAL (CANCER CENTER ONLY)
Abs Immature Granulocytes: 0.02 10*3/uL (ref 0.00–0.07)
Basophils Absolute: 0 10*3/uL (ref 0.0–0.1)
Basophils Relative: 1 %
Eosinophils Absolute: 0.1 10*3/uL (ref 0.0–0.5)
Eosinophils Relative: 3 %
HCT: 32.3 % — ABNORMAL LOW (ref 36.0–46.0)
Hemoglobin: 11.4 g/dL — ABNORMAL LOW (ref 12.0–15.0)
Immature Granulocytes: 0 %
Lymphocytes Relative: 41 %
Lymphs Abs: 2 10*3/uL (ref 0.7–4.0)
MCH: 30.5 pg (ref 26.0–34.0)
MCHC: 35.3 g/dL (ref 30.0–36.0)
MCV: 86.4 fL (ref 80.0–100.0)
Monocytes Absolute: 0.4 10*3/uL (ref 0.1–1.0)
Monocytes Relative: 8 %
Neutro Abs: 2.3 10*3/uL (ref 1.7–7.7)
Neutrophils Relative %: 47 %
Platelet Count: 274 10*3/uL (ref 150–400)
RBC: 3.74 MIL/uL — ABNORMAL LOW (ref 3.87–5.11)
RDW: 15.9 % — ABNORMAL HIGH (ref 11.5–15.5)
WBC Count: 4.8 10*3/uL (ref 4.0–10.5)
nRBC: 0 % (ref 0.0–0.2)

## 2023-03-20 LAB — PREGNANCY, URINE: Preg Test, Ur: NEGATIVE

## 2023-03-20 MED ORDER — FAMOTIDINE IN NACL 20-0.9 MG/50ML-% IV SOLN
20.0000 mg | Freq: Once | INTRAVENOUS | Status: AC
  Administered 2023-03-20: 20 mg via INTRAVENOUS
  Filled 2023-03-20: qty 50

## 2023-03-20 MED ORDER — SODIUM CHLORIDE 0.9% FLUSH
10.0000 mL | INTRAVENOUS | Status: DC | PRN
  Administered 2023-03-20: 10 mL

## 2023-03-20 MED ORDER — SODIUM CHLORIDE 0.9% FLUSH
10.0000 mL | Freq: Once | INTRAVENOUS | Status: AC
  Administered 2023-03-20: 10 mL

## 2023-03-20 MED ORDER — SODIUM CHLORIDE 0.9 % IV SOLN
65.0000 mg/m2 | Freq: Once | INTRAVENOUS | Status: AC
  Administered 2023-03-20: 138 mg via INTRAVENOUS
  Filled 2023-03-20: qty 23

## 2023-03-20 MED ORDER — DIPHENHYDRAMINE HCL 50 MG/ML IJ SOLN
25.0000 mg | Freq: Once | INTRAMUSCULAR | Status: AC
  Administered 2023-03-20: 25 mg via INTRAVENOUS
  Filled 2023-03-20: qty 1

## 2023-03-20 MED ORDER — PALONOSETRON HCL INJECTION 0.25 MG/5ML
0.2500 mg | Freq: Once | INTRAVENOUS | Status: AC
  Administered 2023-03-20: 0.25 mg via INTRAVENOUS
  Filled 2023-03-20: qty 5

## 2023-03-20 MED ORDER — SODIUM CHLORIDE 0.9 % IV SOLN
10.0000 mg | Freq: Once | INTRAVENOUS | Status: AC
  Administered 2023-03-20: 10 mg via INTRAVENOUS
  Filled 2023-03-20: qty 10

## 2023-03-20 MED ORDER — SODIUM CHLORIDE 0.9 % IV SOLN: Freq: Once | INTRAVENOUS | Status: AC

## 2023-03-20 MED ORDER — HEPARIN SOD (PORK) LOCK FLUSH 100 UNIT/ML IV SOLN
500.0000 [IU] | Freq: Once | INTRAVENOUS | Status: AC | PRN
  Administered 2023-03-20: 500 [IU]

## 2023-03-20 NOTE — Assessment & Plan Note (Addendum)
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, DCIS posterior margin positive, lymphovascular invasion not identified, ER 0%, PR 0%, Ki-67 85%, HER2 0 by IHC Margin excision: Posterior margin: Scattered foci of high-grade DCIS, 0/2 lymph nodes negative, final margins negative  (10/02/2022: Initial biopsy was DCIS ER 30% PR 2%)   Treatment plan: Adjuvant chemotherapy with dose dense Adriamycin and Cytoxan followed by Taxol and carboplatin Adjuvant radiation therapy ------------------------------------------------- Current treatment: Completed 4 cycles of dose dense Adriamycin and Cytoxan, today cycle 4 Taxol Chemo toxicities: Fatigue-managed with energy conservation Hospitalization 01/21/2023 for nasal burning, chest discomfort leukocytosis.    Chemo-induced mucositis  Nausea: Aloxi prior to each treatment Hospitalization: 02/17/2023-02/21/2023: COVID 19 infection with pneumonia 6.  Hypokalemia: Monitoring closely  Return to clinic weekly for chemo and every other week for MD/APP follow-up visit

## 2023-03-20 NOTE — Patient Instructions (Signed)
[place] [facility]  Discharge Instructions: Thank you for choosing [facility] to provide your oncology and hematology care.   If you have a lab appointment with the Cancer Center, please go directly to the Cancer Center and check in at the registration area.   Wear comfortable clothing and clothing appropriate for easy access to any Portacath or PICC line.   We strive to give you quality time with your provider. You may need to reschedule your appointment if you arrive late (15 or more minutes).  Arriving late affects you and other patients whose appointments are after yours.  Also, if you miss three or more appointments without notifying the office, you may be dismissed from the clinic at the provider's discretion.      For prescription refill requests, have your pharmacy contact our office and allow 72 hours for refills to be completed.    Today you received the following chemotherapy and/or immunotherapy agents Taxol      To help prevent nausea and vomiting after your treatment, we encourage you to take your nausea medication as directed.  BELOW ARE SYMPTOMS THAT SHOULD BE REPORTED IMMEDIATELY: *FEVER GREATER THAN 100.4 F (38 C) OR HIGHER *CHILLS OR SWEATING *NAUSEA AND VOMITING THAT IS NOT CONTROLLED WITH YOUR NAUSEA MEDICATION *UNUSUAL SHORTNESS OF BREATH *UNUSUAL BRUISING OR BLEEDING *URINARY PROBLEMS (pain or burning when urinating, or frequent urination) *BOWEL PROBLEMS (unusual diarrhea, constipation, pain near the anus) TENDERNESS IN MOUTH AND THROAT WITH OR WITHOUT PRESENCE OF ULCERS (sore throat, sores in mouth, or a toothache) UNUSUAL RASH, SWELLING OR PAIN  UNUSUAL VAGINAL DISCHARGE OR ITCHING   Items with * indicate a potential emergency and should be followed up as soon as possible or go to the Emergency Department if any problems should occur.  Please show the CHEMOTHERAPY ALERT CARD or IMMUNOTHERAPY ALERT CARD at check-in  to the Emergency Department and triage nurse.  Should you have questions after your visit or need to cancel or reschedule your appointment, please contact [place] [facility]  Dept: [phone]  and follow the prompts.  Office hours are 8:00 a.m. to 4:30 p.m. Monday - Friday. Please note that voicemails left after 4:00 p.m. may not be returned until the following business day.  We are closed weekends and major holidays. You have access to a nurse at all times for urgent questions. Please call the main number to the clinic Dept: [phone] and follow the prompts.   For any non-urgent questions, you may also contact your provider using MyChart. We now offer e-Visits for anyone 18 and older to request care online for non-urgent symptoms. For details visit [email or web address].   Also download the MyChart app! Go to the app store, search "MyChart", open the app, select [place], and log in with your MyChart username and password.   

## 2023-03-20 NOTE — Progress Notes (Signed)
Patient Care Team: Hoy Register, MD as PCP - General (Family Medicine) Corky Crafts, MD as PCP - Cardiology (Cardiology) Glendale Chard, DO as Consulting Physician (Neurology) Abigail Miyamoto, MD as Consulting Physician (General Surgery) Serena Croissant, MD as Consulting Physician (Hematology and Oncology) Lonie Peak, MD as Attending Physician (Radiation Oncology) Pershing Proud, RN as Oncology Nurse Navigator Donnelly Angelica, RN as Oncology Nurse Navigator  DIAGNOSIS:  Encounter Diagnosis  Name Primary?   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, ER 30% weak, PR 2%   10/11/2022 Cancer Staging   Staging form: Breast, AJCC 8th Edition - Clinical: Stage 0 (cTis (DCIS), cN0, cM0, G3, ER+, PR+, HER2: Not Assessed) - Signed by Serena Croissant, MD on 10/11/2022 Stage prefix: Initial diagnosis Histologic grading system: 3 grade system   10/11/2022 Genetic Testing   Declined hereditary cancer genetic testing    11/13/2022 Surgery   Right lumpectomy: Grade 3 IDC, 2 cm in size inferior and posterior margins are positive, DCIS posterior margin positive, lymphovascular invasion not identified, ER 0%, PR 0%, Ki-67 85%, HER2 0 by [facility]   12/01/2022 Surgery   Margin excision: Posterior margin: Scattered foci of high-grade DCIS, 0/2 lymph nodes negative   01/01/2023 -  Chemotherapy   Patient is on Treatment Plan : BREAST ADJUVANT DOSE DENSE AC q14d / PACLitaxel q7d       CHIEF COMPLIANT: Cycle 4 Taxol  Discussed the use of AI scribe software for clinical note transcription with the  patient, who gave verbal consent to proceed.  History of Present Illness   The patient, currently undergoing chemotherapy with Taxol, reports experiencing diarrhea, fatigue, and nausea. However, she notes that these side effects are not as severe as those experienced with previous chemotherapy treatments. She also reports a persistent bad taste in her mouth, which has affected her appetite. Despite this, she continues to eat and drink water regularly. She also reports a sore tongue, which she manages with a prescribed mouthwash. She has experienced some nausea, but it is not severe. She also reports increased mucus production and coughing, which she attributes to acid reflux, a condition she had prior to starting chemotherapy. She manages this with over-the-counter Pepcid.       ALLERGIES:  is allergic to latex and tramadol.  MEDICATIONS:  Current Outpatient Medications  Medication Sig Dispense Refill   acetaminophen (TYLENOL) 500 MG tablet Take 1,000 mg by mouth every 6 (six) hours as needed for moderate pain.     albuterol (VENTOLIN HFA) 108 (90 Base) MCG/ACT inhaler TAKE 2 PUFFS BY MOUTH EVERY 6 HOURS AS NEEDED FOR WHEEZE OR SHORTNESS OF BREATH (Patient taking differently: Inhale 2 puffs into the lungs every 6 (six) hours as needed for wheezing or shortness of breath.) 54 each 0   benzonatate (TESSALON) 200 MG capsule Take 1 capsule (200 mg total) by mouth 3 (three) times daily as needed for cough. 21 capsule 0   cyclobenzaprine (FLEXERIL) 10 MG tablet TAKE 1 TABLET BY MOUTH AT BEDTIME AS NEEDED FOR MUSCLE SPASMS (Patient taking differently: Take 10 mg by mouth at bedtime.) 30 tablet 1   DULoxetine (CYMBALTA) 60 MG capsule  Take 60 mg by mouth daily.     esomeprazole (NEXIUM) 40 MG capsule Take 1 capsule (40 mg total) by mouth 2 (two) times daily before a meal. 60 capsule 3   fluconazole (DIFLUCAN) 150 MG tablet Take 1 tablet (150 mg total) by mouth every three (3) days as needed (Yeast  infection). 3 tablet 0   gabapentin (NEURONTIN) 400 MG capsule Take 400 mg by mouth 3 (three) times daily as needed (pain).     hydrOXYzine (ATARAX) 25 MG tablet TAKE 1 TABLET(25 MG) BY MOUTH THREE TIMES DAILY 270 tablet 1   lidocaine (XYLOCAINE) 2 % solution Use as directed 15 mLs in the mouth or throat every 4 (four) hours as needed for mouth pain.     linaclotide (LINZESS) 145 MCG CAPS capsule Take 1 capsule (145 mcg total) by mouth daily before breakfast. 30 capsule 5   metoprolol succinate (TOPROL-XL) 25 MG 24 hr tablet Take 0.5 tablets (12.5 mg total) by mouth daily. For increased heart rate 45 tablet 1   metroNIDAZOLE (METROGEL) 0.75 % vaginal gel Place 1 Applicatorful vaginally at bedtime. Use for 5 nights. 70 g 0   naproxen (NAPROSYN) 500 MG tablet TAKE 1 TABLET BY MOUTH TWICE A DAY AS NEEDED FOR PAIN 60 tablet 1   ondansetron (ZOFRAN) 8 MG tablet Take 1 tab (8 mg) by mouth every 8 hrs as needed for nausea/vomiting. Start third day after doxorubicin/cyclophosphamide chemotherapy. 30 tablet 1   prochlorperazine (COMPAZINE) 10 MG tablet Take 1 tablet (10 mg total) by mouth every 6 (six) hours as needed for nausea or vomiting. 30 tablet 1   zolpidem (AMBIEN) 5 MG tablet Take 1 tablet (5 mg total) by mouth at bedtime as needed for sleep. 30 tablet 1   HYDROcodone bit-homatropine (HYCODAN) 5-1.5 MG/5ML syrup Take 5 mLs by mouth every 6 (six) hours as needed for cough. (Patient not taking: Reported on 03/14/2023) 120 mL 0   hydrocortisone (ANUSOL-HC) 2.5 % rectal cream Place rectally 4 (four) times daily as needed for hemorrhoids or anal itching. (Patient not taking: Reported on 03/14/2023) 30 g 0   methimazole (TAPAZOLE) 5 MG tablet Take 1 tablet (5 mg total) by mouth as directed. 1 tablet Monday through Friday, skip Saturdays and  Sundays (Patient not taking: Reported on 03/20/2023) 60 tablet 3   No current facility-administered medications for this visit.    PHYSICAL EXAMINATION: ECOG  PERFORMANCE STATUS: 1 - Symptomatic but completely ambulatory  Vitals:   03/20/23 1428  BP: 117/73  Pulse: 90  Resp: 18  Temp: (!) 97.5 F (36.4 C)  SpO2: 100%   Filed Weights   03/20/23 1428  Weight: 215 lb 12.8 oz (97.9 kg)      LABORATORY DATA:  I have reviewed the data as listed    Latest Ref Rng & Units 03/20/2023    1:48 PM 03/14/2023    9:50 AM 03/06/2023   10:55 AM  CMP  Glucose 70 - 99 mg/dL 865  784  696   BUN 6 - 20 mg/dL 9  8  11    Creatinine 0.44 - 1.00 mg/dL 2.95  2.84  1.32   Sodium 135 - 145 mmol/L 139  141  138   Potassium 3.5 - 5.1 mmol/L 3.8  3.3  3.9   Chloride 98 - 111 mmol/L 106  106  106   CO2 22 - 32 mmol/L 28  29  26    Calcium 8.9 - 10.3 mg/dL 9.3  9.0  9.3  Total Protein 6.5 - 8.1 g/dL 7.1  6.7  7.1   Total Bilirubin 0.3 - 1.2 mg/dL 0.4  0.3  0.4   Alkaline Phos 38 - 126 U/L 120  111  110   AST 15 - 41 U/L 29  46  21   ALT 0 - 44 U/L 55  71  29     Lab Results  Component Value Date   WBC 4.8 03/20/2023   HGB 11.4 (L) 03/20/2023   HCT 32.3 (L) 03/20/2023   MCV 86.4 03/20/2023   PLT 274 03/20/2023   NEUTROABS 2.3 03/20/2023    ASSESSMENT & PLAN:  Malignant neoplasm of upper-outer quadrant of right breast in female, estrogen receptor negative (HCC) 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, DCIS posterior margin positive, lymphovascular invasion not identified, ER 0%, PR 0%, Ki-67 85%, HER2 0 by IHC Margin excision: Posterior margin: Scattered foci of high-grade DCIS, 0/2 lymph nodes negative, final margins negative  (10/02/2022: Initial biopsy was DCIS ER 30% PR 2%)   Treatment plan: Adjuvant chemotherapy with dose dense Adriamycin and Cytoxan followed by Taxol and carboplatin Adjuvant radiation therapy ------------------------------------------------- Current treatment: Completed 4 cycles of dose dense Adriamycin and Cytoxan, today cycle 4 Taxol Chemo toxicities: Fatigue-managed  with energy conservation Hospitalization 01/21/2023 for nasal burning, chest discomfort leukocytosis.    Chemo-induced mucositis  Nausea: Aloxi prior to each treatment Hospitalization: 02/17/2023-02/21/2023: COVID 19 infection with pneumonia 6.  Hypokalemia: Monitoring closely 7. GERD: Recommended that she take Pepcid    -Next appointments on 03/27/2023 and 04/03/2023 with Karena Addison, and 04/10/2023 with the doctor.  Gastroesophageal Reflux Disease (GERD) Experiencing increased acid reflux and mucus production, likely due to chemotherapy-induced sphincter relaxation. -Continue over-the-counter Pepcid as needed.   No orders of the defined types were placed in this encounter.  The patient has a good understanding of the overall plan. she agrees with it. she will call with any problems that may develop before the next visit here. Total time spent: 30 mins including face to face time and time spent for planning, charting and co-ordination of care   Tamsen Meek, MD 03/20/23

## 2023-03-22 ENCOUNTER — Ambulatory Visit: Payer: Self-pay | Admitting: Licensed Clinical Social Worker

## 2023-03-23 NOTE — Patient Outreach (Signed)
Care Coordination   Follow Up Visit Note   03/22/2023 Name: SHAMARRIA WACHT MRN: [id number] DOB: [birth year]  Rondel Baton Korell is a 42 y.o. year old female who sees Hoy Register, MD for primary care. I spoke with  [name] by phone today.  What matters to the patients health and wellness today?  Symptom Management    Goals Addressed             This Visit's Progress    Obtain Supportive Resources-Finances   On track    Activities and task to complete in order to accomplish goals.   Keep all upcoming appointments discussed today Continue with compliance of taking medication prescribed by Doctor Implement healthy coping skills discussed to assist with management of symptoms Follow up with Department of Social Services          SDOH assessments and interventions completed:  No     Care Coordination Interventions:  Yes, provided  Interventions Today    Flowsheet Row Most Recent Value  Chronic Disease   Chronic disease during today's visit Other  [Breast cancer, mdd, gad]  General Interventions   General Interventions Discussed/Reviewed General Interventions Reviewed, Doctor Visits  Doctor Visits Discussed/Reviewed Doctor Visits Reviewed  Mental Health Interventions   Mental Health Discussed/Reviewed Mental Health Reviewed, Coping Strategies, Anxiety  Nutrition Interventions   Nutrition Discussed/Reviewed Nutrition Reviewed  Pharmacy Interventions   Pharmacy Dicussed/Reviewed Pharmacy Topics Reviewed, Medication Adherence       Follow up plan: Follow up call scheduled for 2-4 weeks    Encounter Outcome:  Patient Visit Completed   Jenel Lucks, MSW, LCSW [facility] Care Management [facility]  [facility] [email or web address] Phone [phone] 6:04 AM

## 2023-03-23 NOTE — Patient Instructions (Signed)
Visit Information  Thank you for taking time to visit with me today. Please don't hesitate to contact me if I can be of assistance to you.   Following are the goals we discussed today:   Goals Addressed             This Visit's Progress    Obtain Supportive Resources-Finances   On track    Activities and task to complete in order to accomplish goals.   Keep all upcoming appointments discussed today Continue with compliance of taking medication prescribed by Doctor Implement healthy coping skills discussed to assist with management of symptoms Follow up with Department of Social Services          Our next appointment is by telephone on 11/25 at 11:30 AM  Please call the care guide team at [phone] if you need to cancel or reschedule your appointment.   If you are experiencing a Mental Health or Behavioral Health Crisis or need someone to talk to, please call the Suicide and Crisis Lifeline: 988 call 911   Patient verbalizes understanding of instructions and care plan provided today and agrees to view in MyChart. Active MyChart status and patient understanding of how to access instructions and care plan via MyChart confirmed with patient.     Jenel Lucks, MSW, LCSW [facility] Care Management [place]  [facility] [email or web address] Phone [phone] 6:05 AM

## 2023-03-26 ENCOUNTER — Ambulatory Visit (INDEPENDENT_AMBULATORY_CARE_PROVIDER_SITE_OTHER): Payer: Medicaid Other | Admitting: Certified Nurse Midwife

## 2023-03-26 ENCOUNTER — Other Ambulatory Visit (HOSPITAL_COMMUNITY)
Admission: RE | Admit: 2023-03-26 | Discharge: 2023-03-26 | Disposition: A | Discharge status: Home / Self Care | Payer: Medicaid Other | Source: Ambulatory Visit | Attending: Certified Nurse Midwife | Admitting: Certified Nurse Midwife

## 2023-03-26 ENCOUNTER — Encounter (HOSPITAL_BASED_OUTPATIENT_CLINIC_OR_DEPARTMENT_OTHER): Payer: Self-pay | Admitting: Certified Nurse Midwife

## 2023-03-26 VITALS — BP 106/50 | HR 107 | Ht 66.0 in | Wt 210.4 lb

## 2023-03-26 DIAGNOSIS — Z124 Encounter for screening for malignant neoplasm of cervix: Secondary | ICD-10-CM | POA: Insufficient documentation

## 2023-03-26 DIAGNOSIS — N930 Postcoital and contact bleeding: Secondary | ICD-10-CM

## 2023-03-26 DIAGNOSIS — N888 Other specified noninflammatory disorders of cervix uteri: Secondary | ICD-10-CM

## 2023-03-26 MED ORDER — FLUCONAZOLE 150 MG PO TABS
150.0000 mg | ORAL_TABLET | ORAL | 2 refills | Status: DC | PRN
Start: 2023-03-26 — End: 2023-06-08

## 2023-03-26 MED FILL — Dexamethasone Sodium Phosphate Inj 100 MG/10ML: INTRAMUSCULAR | Qty: 1 | Status: AC

## 2023-03-26 NOTE — Progress Notes (Unsigned)
03/07/23  Pt message: "every sense i been on chemotherapy for my breast cancer thats gone now i havent had a period and i got hospitalized for 4 days and they had to gove me alot antibiotic i got yeast infection the medicine gave me not working but when i had sex i had blood come out so i try to make appointment on my chart"   GYNECOLOGY  VISIT  CC:   Pt reports bleeding after sexual intercourse  HPI: 42 y.o. [id number] Single Black or African American female here for problem gyn visit. States she had normal menstrual periods until [facility] 2024. Did not have a period in August/September. Over the past few weeks she started to notice some bright red bleeding after intercourse. She is currently undergoing chemo for Breast Cancer diagnosed April 2024. Was recently treated for BV-continues to feel some vaginal irritation.    Past Medical History:  Diagnosis Date   Anxiety    Arthritis    Breast cancer (HCC)    Chlamydia contact, treated    GERD (gastroesophageal reflux disease)    Hyperthyroidism     MEDS:   Current Outpatient Medications on File Prior to Visit  Medication Sig Dispense Refill   acetaminophen (TYLENOL) 500 MG tablet Take 1,000 mg by mouth every 6 (six) hours as needed for moderate pain.     albuterol (VENTOLIN HFA) 108 (90 Base) MCG/ACT inhaler TAKE 2 PUFFS BY MOUTH EVERY 6 HOURS AS NEEDED FOR WHEEZE OR SHORTNESS OF BREATH (Patient taking differently: Inhale 2 puffs into the lungs every 6 (six) hours as needed for wheezing or shortness of breath.) 54 each 0   benzonatate (TESSALON) 200 MG capsule Take 1 capsule (200 mg total) by mouth 3 (three) times daily as needed for cough. 21 capsule 0   cyclobenzaprine (FLEXERIL) 10 MG tablet TAKE 1 TABLET BY MOUTH AT BEDTIME AS NEEDED FOR MUSCLE SPASMS (Patient taking differently: Take 10 mg by mouth at bedtime.) 30 tablet 1   DULoxetine (CYMBALTA) 60 MG capsule Take 60 mg by mouth daily.     esomeprazole (NEXIUM) 40 MG capsule Take 1 capsule  (40 mg total) by mouth 2 (two) times daily before a meal. 60 capsule 3   fluconazole (DIFLUCAN) 150 MG tablet Take 1 tablet (150 mg total) by mouth every three (3) days as needed (Yeast infection). 3 tablet 0   gabapentin (NEURONTIN) 400 MG capsule Take 400 mg by mouth 3 (three) times daily as needed (pain).     HYDROcodone bit-homatropine (HYCODAN) 5-1.5 MG/5ML syrup Take 5 mLs by mouth every 6 (six) hours as needed for cough. 120 mL 0   hydrocortisone (ANUSOL-HC) 2.5 % rectal cream Place rectally 4 (four) times daily as needed for hemorrhoids or anal itching. 30 g 0   hydrOXYzine (ATARAX) 25 MG tablet TAKE 1 TABLET(25 MG) BY MOUTH THREE TIMES DAILY 270 tablet 1   lidocaine (XYLOCAINE) 2 % solution Use as directed 15 mLs in the mouth or throat every 4 (four) hours as needed for mouth pain.     linaclotide (LINZESS) 145 MCG CAPS capsule Take 1 capsule (145 mcg total) by mouth daily before breakfast. 30 capsule 5   methimazole (TAPAZOLE) 5 MG tablet Take 1 tablet (5 mg total) by mouth as directed. 1 tablet Monday through Friday, skip Saturdays and  Sundays 60 tablet 3   metoprolol succinate (TOPROL-XL) 25 MG 24 hr tablet Take 0.5 tablets (12.5 mg total) by mouth daily. For increased heart rate 45 tablet 1  metroNIDAZOLE (METROGEL) 0.75 % vaginal gel Place 1 Applicatorful vaginally at bedtime. Use for 5 nights. 70 g 0   naproxen (NAPROSYN) 500 MG tablet TAKE 1 TABLET BY MOUTH TWICE A DAY AS NEEDED FOR PAIN 60 tablet 1   ondansetron (ZOFRAN) 8 MG tablet Take 1 tab (8 mg) by mouth every 8 hrs as needed for nausea/vomiting. Start third day after doxorubicin/cyclophosphamide chemotherapy. 30 tablet 1   prochlorperazine (COMPAZINE) 10 MG tablet Take 1 tablet (10 mg total) by mouth every 6 (six) hours as needed for nausea or vomiting. 30 tablet 1   zolpidem (AMBIEN) 5 MG tablet Take 1 tablet (5 mg total) by mouth at bedtime as needed for sleep. 30 tablet 1   [DISCONTINUED] medroxyPROGESTERone (PROVERA) 10 MG  tablet Take 2 tablets by mouth daily (Patient not taking: Reported on 06/05/2019) 60 tablet 0   No current facility-administered medications on file prior to visit.    ALLERGIES: Latex and Tramadol  ROS  PHYSICAL EXAMINATION:    BP (!) 106/50 (BP Location: Left Arm, Patient Position: Sitting, Cuff Size: Normal)   Pulse (!) 107   Ht 5\' 6"  (1.676 m)   Wt 210 lb 6.4 oz (95.4 kg)   BMI 33.96 kg/m     General appearance: alert, cooperative and appears stated age Pelvic: External genitalia:  no lesions              Urethra:  normal appearing urethra with no masses, tenderness or lesions              Bartholins and Skenes: normal                 Vagina: normal without tenderness, induration or masses              Cervix:  pap smear collected, +cervical bleeding noted with pap smear  Cervix friable.  Chaperone, Raynelle Dick, CMA, was present for exam.  Assessment/Plan: 1. Screening for cervical cancer - Cytology - PAP( [place])  2. PCB (post coital bleeding) - US PELVIS TRANSVAGINAL NON-OB (TV ONLY); Future  Will treat with Diflucan 150mg  po due to c/o vaginal irritation with recent administration of Flagyl (likely yeast). Follow-up planned in 1-2 weeks. Letta Kocher

## 2023-03-27 ENCOUNTER — Other Ambulatory Visit: Payer: Self-pay | Admitting: Hematology and Oncology

## 2023-03-27 ENCOUNTER — Other Ambulatory Visit: Payer: 59

## 2023-03-27 ENCOUNTER — Ambulatory Visit: Payer: 59

## 2023-03-27 ENCOUNTER — Other Ambulatory Visit: Payer: Self-pay

## 2023-03-27 ENCOUNTER — Inpatient Hospital Stay: Payer: Medicaid Other

## 2023-03-27 ENCOUNTER — Encounter: Payer: Self-pay | Admitting: Hematology and Oncology

## 2023-03-27 VITALS — BP 110/70 | HR 105 | Resp 18 | Ht 66.0 in | Wt 211.0 lb

## 2023-03-27 DIAGNOSIS — N888 Other specified noninflammatory disorders of cervix uteri: Secondary | ICD-10-CM | POA: Insufficient documentation

## 2023-03-27 DIAGNOSIS — N930 Postcoital and contact bleeding: Secondary | ICD-10-CM | POA: Insufficient documentation

## 2023-03-27 DIAGNOSIS — Z171 Estrogen receptor negative status [ER-]: Secondary | ICD-10-CM

## 2023-03-27 DIAGNOSIS — Z124 Encounter for screening for malignant neoplasm of cervix: Secondary | ICD-10-CM | POA: Insufficient documentation

## 2023-03-27 DIAGNOSIS — Z5111 Encounter for antineoplastic chemotherapy: Secondary | ICD-10-CM | POA: Diagnosis not present

## 2023-03-27 DIAGNOSIS — Z95828 Presence of other vascular implants and grafts: Secondary | ICD-10-CM

## 2023-03-27 LAB — CYTOLOGY - PAP
Chlamydia: NEGATIVE
Comment: NEGATIVE
Comment: NEGATIVE
Comment: NEGATIVE
Comment: NORMAL
Diagnosis: NEGATIVE
Diagnosis: REACTIVE
High risk HPV: NEGATIVE
Neisseria Gonorrhea: NEGATIVE
Trichomonas: POSITIVE — AB

## 2023-03-27 LAB — CMP (CANCER CENTER ONLY)
ALT: 71 U/L — ABNORMAL HIGH (ref 0–44)
AST: 37 U/L (ref 15–41)
Albumin: 4.2 g/dL (ref 3.5–5.0)
Alkaline Phosphatase: 114 U/L (ref 38–126)
Anion gap: 8 (ref 5–15)
BUN: 12 mg/dL (ref 6–20)
CO2: 26 mmol/L (ref 22–32)
Calcium: 9.6 mg/dL (ref 8.9–10.3)
Chloride: 106 mmol/L (ref 98–111)
Creatinine: 0.77 mg/dL (ref 0.44–1.00)
GFR, Estimated: >60 mL/min (ref >60)
Glucose, Bld: 105 mg/dL — ABNORMAL HIGH (ref 70–99)
Potassium: 3.5 mmol/L (ref 3.5–5.1)
Sodium: 140 mmol/L (ref 135–145)
Total Bilirubin: 0.6 mg/dL (ref 0.3–1.2)
Total Protein: 7.3 g/dL (ref 6.5–8.1)

## 2023-03-27 LAB — CBC WITH DIFFERENTIAL (CANCER CENTER ONLY)
Abs Immature Granulocytes: 0.02 10*3/uL (ref 0.00–0.07)
Basophils Absolute: 0 10*3/uL (ref 0.0–0.1)
Basophils Relative: 1 %
Eosinophils Absolute: 0.1 10*3/uL (ref 0.0–0.5)
Eosinophils Relative: 2 %
HCT: 32.8 % — ABNORMAL LOW (ref 36.0–46.0)
Hemoglobin: 11.4 g/dL — ABNORMAL LOW (ref 12.0–15.0)
Immature Granulocytes: 0 %
Lymphocytes Relative: 33 %
Lymphs Abs: 1.9 10*3/uL (ref 0.7–4.0)
MCH: 30.5 pg (ref 26.0–34.0)
MCHC: 34.8 g/dL (ref 30.0–36.0)
MCV: 87.7 fL (ref 80.0–100.0)
Monocytes Absolute: 0.6 10*3/uL (ref 0.1–1.0)
Monocytes Relative: 10 %
Neutro Abs: 3.1 10*3/uL (ref 1.7–7.7)
Neutrophils Relative %: 54 %
Platelet Count: 288 10*3/uL (ref 150–400)
RBC: 3.74 MIL/uL — ABNORMAL LOW (ref 3.87–5.11)
RDW: 15.9 % — ABNORMAL HIGH (ref 11.5–15.5)
WBC Count: 5.7 10*3/uL (ref 4.0–10.5)
nRBC: 0 % (ref 0.0–0.2)

## 2023-03-27 MED ORDER — FAMOTIDINE IN NACL 20-0.9 MG/50ML-% IV SOLN
20.0000 mg | Freq: Once | INTRAVENOUS | Status: AC
  Administered 2023-03-27: 20 mg via INTRAVENOUS
  Filled 2023-03-27: qty 50

## 2023-03-27 MED ORDER — DIPHENHYDRAMINE HCL 50 MG/ML IJ SOLN
25.0000 mg | Freq: Once | INTRAMUSCULAR | Status: AC
  Administered 2023-03-27: 25 mg via INTRAVENOUS
  Filled 2023-03-27: qty 1

## 2023-03-27 MED ORDER — SODIUM CHLORIDE 0.9 % IV SOLN: Freq: Once | INTRAVENOUS | Status: AC

## 2023-03-27 MED ORDER — DEXAMETHASONE SODIUM PHOSPHATE 10 MG/ML IJ SOLN: 10.0000 mg | Freq: Once | INTRAMUSCULAR | Status: DC

## 2023-03-27 MED ORDER — SODIUM CHLORIDE 0.9 % IV SOLN
50.0000 mg/m2 | Freq: Once | INTRAVENOUS | Status: AC
  Administered 2023-03-27: 108 mg via INTRAVENOUS
  Filled 2023-03-27: qty 18

## 2023-03-27 MED ORDER — HEPARIN SOD (PORK) LOCK FLUSH 100 UNIT/ML IV SOLN
500.0000 [IU] | Freq: Once | INTRAVENOUS | Status: AC | PRN
  Administered 2023-03-27: 500 [IU]

## 2023-03-27 MED ORDER — PALONOSETRON HCL INJECTION 0.25 MG/5ML
0.2500 mg | Freq: Once | INTRAVENOUS | Status: AC
  Administered 2023-03-27: 0.25 mg via INTRAVENOUS
  Filled 2023-03-27: qty 5

## 2023-03-27 MED ORDER — SODIUM CHLORIDE 0.9% FLUSH
10.0000 mL | Freq: Once | INTRAVENOUS | Status: AC
  Administered 2023-03-27: 10 mL

## 2023-03-27 MED ORDER — SODIUM CHLORIDE 0.9% FLUSH
10.0000 mL | INTRAVENOUS | Status: DC | PRN
  Administered 2023-03-27: 10 mL

## 2023-03-27 MED ORDER — SODIUM CHLORIDE 0.9 % IV SOLN
10.0000 mg | Freq: Once | INTRAVENOUS | Status: AC
  Administered 2023-03-27: 10 mg via INTRAVENOUS
  Filled 2023-03-27: qty 10

## 2023-03-27 NOTE — Patient Instructions (Signed)
[place] [facility]  Discharge Instructions: Thank you for choosing [facility] to provide your oncology and hematology care.   If you have a lab appointment with the Cancer Center, please go directly to the Cancer Center and check in at the registration area.   Wear comfortable clothing and clothing appropriate for easy access to any Portacath or PICC line.   We strive to give you quality time with your provider. You may need to reschedule your appointment if you arrive late (15 or more minutes).  Arriving late affects you and other patients whose appointments are after yours.  Also, if you miss three or more appointments without notifying the office, you may be dismissed from the clinic at the provider's discretion.      For prescription refill requests, have your pharmacy contact our office and allow 72 hours for refills to be completed.    Today you received the following chemotherapy and/or immunotherapy agents Taxol      To help prevent nausea and vomiting after your treatment, we encourage you to take your nausea medication as directed.  BELOW ARE SYMPTOMS THAT SHOULD BE REPORTED IMMEDIATELY: *FEVER GREATER THAN 100.4 F (38 C) OR HIGHER *CHILLS OR SWEATING *NAUSEA AND VOMITING THAT IS NOT CONTROLLED WITH YOUR NAUSEA MEDICATION *UNUSUAL SHORTNESS OF BREATH *UNUSUAL BRUISING OR BLEEDING *URINARY PROBLEMS (pain or burning when urinating, or frequent urination) *BOWEL PROBLEMS (unusual diarrhea, constipation, pain near the anus) TENDERNESS IN MOUTH AND THROAT WITH OR WITHOUT PRESENCE OF ULCERS (sore throat, sores in mouth, or a toothache) UNUSUAL RASH, SWELLING OR PAIN  UNUSUAL VAGINAL DISCHARGE OR ITCHING   Items with * indicate a potential emergency and should be followed up as soon as possible or go to the Emergency Department if any problems should occur.  Please show the CHEMOTHERAPY ALERT CARD or IMMUNOTHERAPY ALERT CARD at check-in  to the Emergency Department and triage nurse.  Should you have questions after your visit or need to cancel or reschedule your appointment, please contact [place] [facility]  Dept: [phone]  and follow the prompts.  Office hours are 8:00 a.m. to 4:30 p.m. Monday - Friday. Please note that voicemails left after 4:00 p.m. may not be returned until the following business day.  We are closed weekends and major holidays. You have access to a nurse at all times for urgent questions. Please call the main number to the clinic Dept: [phone] and follow the prompts.   For any non-urgent questions, you may also contact your provider using MyChart. We now offer e-Visits for anyone 104 and older to request care online for non-urgent symptoms. For details visit [email or web address].   Also download the MyChart app! Go to the app store, search "MyChart", open the app, select [place], and log in with your MyChart username and password.

## 2023-03-27 NOTE — Progress Notes (Signed)
OK to treat today with HR 105 per Dr. Al Pimple. Pt having neuropathy, Dr. Al Pimple notified and Taxol dose reduced.

## 2023-03-28 ENCOUNTER — Encounter: Payer: Self-pay | Admitting: *Deleted

## 2023-03-28 ENCOUNTER — Ambulatory Visit: Payer: 59

## 2023-03-28 ENCOUNTER — Other Ambulatory Visit: Payer: 59

## 2023-03-28 ENCOUNTER — Encounter: Payer: Self-pay | Admitting: Hematology and Oncology

## 2023-03-28 ENCOUNTER — Other Ambulatory Visit (HOSPITAL_BASED_OUTPATIENT_CLINIC_OR_DEPARTMENT_OTHER): Payer: Self-pay | Admitting: Certified Nurse Midwife

## 2023-03-28 ENCOUNTER — Other Ambulatory Visit: Payer: Self-pay | Admitting: *Deleted

## 2023-03-28 DIAGNOSIS — A5901 Trichomonal vulvovaginitis: Secondary | ICD-10-CM | POA: Insufficient documentation

## 2023-03-28 MED ORDER — METRONIDAZOLE 500 MG PO TABS
2000.0000 mg | ORAL_TABLET | Freq: Once | ORAL | 1 refills | Status: AC
Start: 2023-03-28 — End: 2023-03-28

## 2023-03-28 NOTE — Progress Notes (Signed)
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  MD out of office, [facility] visit scheduled for further evaluation.

## 2023-03-29 ENCOUNTER — Inpatient Hospital Stay (HOSPITAL_BASED_OUTPATIENT_CLINIC_OR_DEPARTMENT_OTHER): Payer: Medicaid Other | Admitting: Physician Assistant

## 2023-03-29 VITALS — BP 126/92 | HR 95 | Temp 98.4°F | Resp 16 | Wt 213.3 lb

## 2023-03-29 DIAGNOSIS — Z171 Estrogen receptor negative status [ER-]: Secondary | ICD-10-CM | POA: Diagnosis not present

## 2023-03-29 DIAGNOSIS — Z5111 Encounter for antineoplastic chemotherapy: Secondary | ICD-10-CM | POA: Diagnosis not present

## 2023-03-29 DIAGNOSIS — G629 Polyneuropathy, unspecified: Secondary | ICD-10-CM

## 2023-03-29 DIAGNOSIS — C50411 Malignant neoplasm of upper-outer quadrant of right female breast: Secondary | ICD-10-CM

## 2023-03-29 MED ORDER — GABAPENTIN 400 MG PO CAPS
ORAL_CAPSULE | ORAL | 0 refills | Status: DC
Start: 2023-03-29 — End: 2023-05-31

## 2023-03-29 NOTE — Progress Notes (Signed)
Symptom Management Consult Note [facility]    Patient Care Team: Hoy Register, MD as PCP - General (Family Medicine) Corky Crafts, MD as PCP - Cardiology (Cardiology) Glendale Chard, DO as Consulting Physician (Neurology) Abigail Miyamoto, MD as Consulting Physician (General Surgery) Serena Croissant, MD as Consulting Physician (Hematology and Oncology) Lonie Peak, MD as Attending Physician (Radiation Oncology) Pershing Proud, RN as Oncology Nurse Navigator Donnelly Angelica, RN as Oncology Nurse Navigator    Name / MRN / DOBMaudelle Bell  [id number]  06-06-1981   Date of visit: 03/29/2023   Chief Complaint/Reason for visit: neuropathy   Current Therapy: Taxol  Last treatment:  Day 1   Cycle 9 on 03/27/23   ASSESSMENT & PLAN: Patient is a 42 y.o. female with oncologic history of malignant neoplasm of upper-outer quadrant of right breast in female, estrogen receptor negative followed by Dr. Pamelia Hoit. I have viewed most recent oncology note and lab work.    #Malignant neoplasm of upper-outer quadrant of right breast in female, estrogen receptor negative - Next appointment with oncologist is 04/03/23   #Neuropathy -Grade 2. -Has had taxol dose reduced twice, with most recent reduction being her last treatment. -Patient started Gabapentin 8/20 when neuropathy worsened and has been taking consistently since then. -Symptoms not controlled. Plan today is to increase her Gabapentin dose in attempt to better control her symptoms.  She was taking 400 mg 3 times daily and the change will be 400 mg twice daily and 800 mg at bedtime. -Symptoms can be rechecked at upcoming oncology appointment    Strict ED precautions discussed should symptoms worsen.   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, ER 30% weak, PR 2%   10/11/2022 Cancer Staging   Staging form: Breast, AJCC 8th Edition - Clinical: Stage 0 (cTis (DCIS), cN0, cM0, G3, ER+, PR+, HER2: Not Assessed) - Signed by Serena Croissant, MD on 10/11/2022 Stage prefix: Initial diagnosis Histologic grading system: 3 grade system   10/11/2022 Genetic Testing   Declined hereditary cancer genetic testing    11/13/2022 Surgery   Right lumpectomy: Grade 3 IDC, 2 cm in size inferior and posterior margins are positive, DCIS posterior margin positive, lymphovascular invasion not identified, ER 0%, PR 0%, Ki-67 85%, HER2 0 by [facility]   12/01/2022 Surgery   Margin excision: Posterior margin: Scattered foci of high-grade DCIS, 0/2 lymph nodes negative   01/01/2023 -  Chemotherapy   Patient is on Treatment Plan : BREAST ADJUVANT DOSE DENSE AC q14d / PACLitaxel q7d         Interval history-: Laurie Bell is a 42 y.o. female with oncologic history as above presenting to [facility] today with chief complaint of neuropathy.  She presents unaccompanied to clinic today.  Patient previously saw Dr. Wynn Banker at [facility] for left-sided neck and arm pain that been treated with multiple medications including pregabalin, gabapentin and Cymbalta.  Patient states she stopped Cymbalta last month because it was making her feel down which she did not like.  She had stopped taking have a Penton in July 2024 because she did not feel as if it was helping neuropathy.  Patient reports she has been overall tolerating chemo well besides the  neuropathy she is now experiencing.  This started after her second treatment.  She had a leftover prescription of gabapentin 400 mg that she started taking on 01/30/2023.  She has been taking 400 mg 3 times daily consistently without much symptom improvement.  Patient states her  last chemo was dose reduced however she is having severe pain in both of her index and middle fingers as well as her feet. The pain in her fingers was initially just at the finger tips however now extends to the base of fingers. She describes the pain as burning and tingling with intermittent numbness.  She needed assistance to button her shirt today because of the pain.  She is also hesitant to bear weight on her feet because of the severe pain.  She rates the pain currently 8 out of 10 in severity.    ROS  All other systems are reviewed and are negative for acute change except as noted in the HPI.    Allergies  Allergen Reactions   Latex Other (See Comments)    Burn in vaginal area with latex condoms   Tramadol Other (See Comments)    States "it messes me up" no further info     Past Medical History:  Diagnosis Date   Anxiety    Arthritis    Breast cancer (HCC)    Chlamydia contact, treated    GERD (gastroesophageal reflux disease)    Hyperthyroidism      Past Surgical History:  Procedure Laterality Date   AXILLARY SENTINEL NODE BIOPSY Right 12/01/2022   Procedure: RIGHT AXILLARY SENTINEL NODE BIOPSY;  Surgeon: Abigail Miyamoto, MD;  Location: [facility];  Service: General;  Laterality: Right;   BREAST BIOPSY Right 10/02/2022   MM RT BREAST BX W LOC DEV 1ST LESION IMAGE BX SPEC STEREO GUIDE 10/02/2022 [facility]   BREAST BIOPSY  11/10/2022   MM RT RADIOACTIVE SEED LOC MAMMO GUIDE 11/10/2022 [facility]   BREAST LUMPECTOMY WITH RADIOACTIVE SEED LOCALIZATION Right 11/13/2022   Procedure: RIGHT BREAST LUMPECTOMY WITH RADIOACTIVE SEED LOCALIZATION;  Surgeon: Abigail Miyamoto, MD;  Location: [facility];  Service: General;  Laterality: Right;   ENDOMETRIAL ABLATION N/A 04/25/2017   Procedure: ENDOMETRIAL ABLATION With NOVASURE;  Surgeon: Adam Phenix, MD;  Location: [facility];  Service: Gynecology;  Laterality: N/A;   ESOPHAGEAL MANOMETRY N/A  07/27/2021   Procedure: ESOPHAGEAL MANOMETRY (EM);  Surgeon: Imogene Burn, MD;  Location: [facility];  Service: Gastroenterology;  Laterality: N/A;   PORTACATH PLACEMENT N/A 12/01/2022   Procedure: INSERTION PORT-A-CATH WITH ULTRASOUND GUIDANCE;  Surgeon: Abigail Miyamoto, MD;  Location: [facility];  Service: General;  Laterality: N/A;   RE-EXCISION OF BREAST CANCER,SUPERIOR MARGINS Right 12/01/2022   Procedure: RE-EXCISION OF RIGHT BREAST CANCER;  Surgeon: Abigail Miyamoto, MD;  Location: [facility];  Service: General;  Laterality: Right;   TUBAL LIGATION  2003    Social History   Socioeconomic History   Marital status: Single    Spouse name: Not on file   Number of children: 2   Years of education: Not on file   Highest education level: Not on file  Occupational History   Occupation: Event organiser: [employer]  Tobacco Use   Smoking status: Never   Smokeless tobacco: Never  Vaping Use   Vaping status: Never Used  Substance and Sexual Activity   Alcohol use: No   Drug use: Not Currently    Types: Marijuana    Comment:  quit 2018   Sexual activity: Not Currently    Birth control/protection: Surgical  Other Topics Concern   Not on file  Social History Narrative   ** Merged History Encounter **       Right Handed  Lives in a one story home    Social Determinants of Health   Financial Resource Strain: Medium Risk (11/03/2021)   Overall Financial Resource Strain (CARDIA)    Difficulty of Paying Living Expenses: Somewhat hard  Food Insecurity: No Food Insecurity (02/23/2023)   Hunger Vital Sign    Worried About Running Out of Food in the Last Year: Never true    Ran Out of Food in the Last Year: Never true  Transportation Needs: No Transportation Needs (02/23/2023)   PRAPARE - Administrator, Civil Service (Medical): No    Lack of Transportation (Non-Medical): No  Physical Activity: Sufficiently Active (11/03/2021)   Exercise Vital Sign    Days of Exercise per  Week: 3 days    Minutes of Exercise per Session: 50 min  Stress: Stress Concern Present (11/03/2021)   [employer] of Occupational Health - Occupational Stress Questionnaire    Feeling of Stress : Very much  Social Connections: Moderately Isolated (11/03/2021)   Social Connection and Isolation Panel [NHANES]    Frequency of Communication with Friends and Family: Once a week    Frequency of Social Gatherings with Friends and Family: Once a week    Attends Religious Services: More than 4 times per year    Active Member of [employer] or Organizations: No    Attends Banker Meetings: Never    Marital Status: Living with partner  Intimate Partner Violence: Not At Risk (02/17/2023)   Humiliation, Afraid, Rape, and Kick questionnaire    Fear of Current or Ex-Partner: No    Emotionally Abused: No    Physically Abused: No    Sexually Abused: No    Family History  Problem Relation Age of Onset   Hypertension Mother    Hypertension Maternal Aunt    Breast cancer Maternal Grandmother        dx > 50   Stomach cancer Maternal Grandfather        dx > 50   Breast cancer Paternal Grandmother        dx > 50   Esophageal cancer Neg Hx    Rectal cancer Neg Hx    Colon cancer Neg Hx      Current Outpatient Medications:    gabapentin (NEURONTIN) 400 MG capsule, Take 400 mg twice a day (morning and noon) and 800 mg at bedtime., Disp: 56 capsule, Rfl: 0   metroNIDAZOLE (FLAGYL) 500 MG tablet, Take 1,000 mg by mouth 2 (two) times daily., Disp: , Rfl:    acetaminophen (TYLENOL) 500 MG tablet, Take 1,000 mg by mouth every 6 (six) hours as needed for moderate pain., Disp: , Rfl:    albuterol (VENTOLIN HFA) 108 (90 Base) MCG/ACT inhaler, TAKE 2 PUFFS BY MOUTH EVERY 6 HOURS AS NEEDED FOR WHEEZE OR SHORTNESS OF BREATH (Patient taking differently: Inhale 2 puffs into the lungs every 6 (six) hours as needed for wheezing or shortness of breath.), Disp: 54 each, Rfl: 0   benzonatate (TESSALON) 200  MG capsule, Take 1 capsule (200 mg total) by mouth 3 (three) times daily as needed for cough., Disp: 21 capsule, Rfl: 0   cyclobenzaprine (FLEXERIL) 10 MG tablet, TAKE 1 TABLET BY MOUTH AT BEDTIME AS NEEDED FOR MUSCLE SPASMS (  Patient taking differently: Take 10 mg by mouth at bedtime.), Disp: 30 tablet, Rfl: 1   esomeprazole (NEXIUM) 40 MG capsule, Take 1 capsule (40 mg total) by mouth 2 (two) times daily before a meal., Disp: 60 capsule, Rfl: 3   fluconazole (DIFLUCAN) 150 MG tablet, Take 1 tablet (150 mg total) by mouth every three (3) days as needed (Yeast infection)., Disp: 3 tablet, Rfl: 0   fluconazole (DIFLUCAN) 150 MG tablet, Take 1 tablet (150 mg total) by mouth every other day as needed., Disp: 2 tablet, Rfl: 2   HYDROcodone bit-homatropine (HYCODAN) 5-1.5 MG/5ML syrup, Take 5 mLs by mouth every 6 (six) hours as needed for cough., Disp: 120 mL, Rfl: 0   hydrocortisone (ANUSOL-HC) 2.5 % rectal cream, Place rectally 4 (four) times daily as needed for hemorrhoids or anal itching., Disp: 30 g, Rfl: 0   hydrOXYzine (ATARAX) 25 MG tablet, TAKE 1 TABLET(25 MG) BY MOUTH THREE TIMES DAILY, Disp: 270 tablet, Rfl: 1   lidocaine (XYLOCAINE) 2 % solution, Use as directed 15 mLs in the mouth or throat every 4 (four) hours as needed for mouth pain., Disp: , Rfl:    linaclotide (LINZESS) 145 MCG CAPS capsule, Take 1 capsule (145 mcg total) by mouth daily before breakfast., Disp: 30 capsule, Rfl: 5   methimazole (TAPAZOLE) 5 MG tablet, Take 1 tablet (5 mg total) by mouth as directed. 1 tablet Monday through Friday, skip Saturdays and  Sundays, Disp: 60 tablet, Rfl: 3   metoprolol succinate (TOPROL-XL) 25 MG 24 hr tablet, Take 0.5 tablets (12.5 mg total) by mouth daily. For increased heart rate, Disp: 45 tablet, Rfl: 1   metroNIDAZOLE (METROGEL) 0.75 % vaginal gel, Place 1 Applicatorful vaginally at bedtime. Use for 5 nights., Disp: 70 g, Rfl: 0   naproxen (NAPROSYN) 500 MG tablet, TAKE 1 TABLET BY MOUTH TWICE  A DAY AS NEEDED FOR PAIN, Disp: 60 tablet, Rfl: 1   ondansetron (ZOFRAN) 8 MG tablet, Take 1 tab (8 mg) by mouth every 8 hrs as needed for nausea/vomiting. Start third day after doxorubicin/cyclophosphamide chemotherapy., Disp: 30 tablet, Rfl: 1   prochlorperazine (COMPAZINE) 10 MG tablet, Take 1 tablet (10 mg total) by mouth every 6 (six) hours as needed for nausea or vomiting., Disp: 30 tablet, Rfl: 1   zolpidem (AMBIEN) 5 MG tablet, Take 1 tablet (5 mg total) by mouth at bedtime as needed for sleep., Disp: 30 tablet, Rfl: 1  PHYSICAL EXAM: ECOG FS:1 - Symptomatic but completely ambulatory    Vitals:   03/29/23 1423  BP: (!) 126/92  Pulse: 95  Resp: 16  Temp: 98.4 F (36.9 C)  TempSrc: Oral  SpO2: 100%  Weight: 213 lb 4.8 oz (96.8 kg)   Physical Exam Vitals and nursing note reviewed.  Constitutional:      Appearance: She is not ill-appearing or toxic-appearing.  HENT:     Head: Normocephalic.  Eyes:     Conjunctiva/sclera: Conjunctivae normal.  Cardiovascular:     Rate and Rhythm: Normal rate and regular rhythm.     Pulses: Normal pulses.          Radial pulses are 2+ on the right side and 2+ on the left side.       Dorsalis pedis pulses are 2+ on the right side and 2+ on the left side.  Pulmonary:     Effort: Pulmonary effort is normal.  Musculoskeletal:        General: Normal range of motion.  Cervical back: Normal range of motion.     Right lower leg: No edema.     Left lower leg: No edema.     Comments: No deformity noted to extremities.  Skin:    General: Skin is warm and dry.  Neurological:     Mental Status: She is alert.     Comments: Sensation decreased in bilateral second and third fingers.   Normal sensation in bilateral lower extremities.        LABORATORY DATA: I have reviewed the data as listed    Latest Ref Rng & Units 03/27/2023   12:37 PM 03/20/2023    1:48 PM 03/14/2023    9:50 AM  CBC  WBC 4.0 - 10.5 K/uL 5.7  4.8  3.6   Hemoglobin  12.0 - 15.0 g/dL 95.6  21.3  08.6   Hematocrit 36.0 - 46.0 % 32.8  32.3  31.5   Platelets 150 - 400 K/uL 288  274  262         Latest Ref Rng & Units 03/27/2023   12:37 PM 03/20/2023    1:48 PM 03/14/2023    9:50 AM  CMP  Glucose 70 - 99 mg/dL 578  469  629   BUN 6 - 20 mg/dL 12  9  8    Creatinine 0.44 - 1.00 mg/dL 5.28  4.13  2.44   Sodium 135 - 145 mmol/L 140  139  141   Potassium 3.5 - 5.1 mmol/L 3.5  3.8  3.3   Chloride 98 - 111 mmol/L 106  106  106   CO2 22 - 32 mmol/L 26  28  29    Calcium 8.9 - 10.3 mg/dL 9.6  9.3  9.0   Total Protein 6.5 - 8.1 g/dL 7.3  7.1  6.7   Total Bilirubin 0.3 - 1.2 mg/dL 0.6  0.4  0.3   Alkaline Phos 38 - 126 U/L 114  120  111   AST 15 - 41 U/L 37  29  46   ALT 0 - 44 U/L 71  55  71        RADIOGRAPHIC STUDIES (from last 24 hours if applicable) I have personally reviewed the radiological images as listed and agreed with the findings in the report. No results found.      Visit Diagnosis: No diagnosis found.   No orders of the defined types were placed in this encounter.   All questions were answered. The patient knows to call the clinic with any problems, questions or concerns. No barriers to learning was detected.  A total of more than 30 minutes were spent on this encounter with face-to-face time and non-face-to-face time, including preparing to see the patient, ordering tests and/or medications, counseling the patient and coordination of care as outlined above.    Thank you for allowing me to participate in the care of this patient.    Shanon Ace, PA-C Department of Hematology/Oncology [facility] Phone: [phone]  [phone]    03/29/2023 4:41 PM

## 2023-04-02 ENCOUNTER — Ambulatory Visit (INDEPENDENT_AMBULATORY_CARE_PROVIDER_SITE_OTHER): Payer: Medicaid Other | Admitting: Internal Medicine

## 2023-04-02 ENCOUNTER — Encounter: Payer: Self-pay | Admitting: Internal Medicine

## 2023-04-02 VITALS — BP 114/72 | HR 70 | Ht 66.0 in | Wt 214.2 lb

## 2023-04-02 DIAGNOSIS — E042 Nontoxic multinodular goiter: Secondary | ICD-10-CM | POA: Diagnosis not present

## 2023-04-02 DIAGNOSIS — E0590 Thyrotoxicosis, unspecified without thyrotoxic crisis or storm: Secondary | ICD-10-CM | POA: Diagnosis not present

## 2023-04-02 LAB — TSH: TSH: 0.44 u[IU]/mL (ref 0.35–5.50)

## 2023-04-02 LAB — T4, FREE: Free T4: 0.67 ng/dL (ref 0.60–1.60)

## 2023-04-02 NOTE — Progress Notes (Unsigned)
Name: Laurie Bell  MRN/ DOB: [id number], 06-Mar-1981    Age/ Sex: 42 y.o., female    PCP: Laurie Register, MD   Reason for Endocrinology Evaluation: Low TSH     Date of Initial Endocrinology Evaluation: 12/09/2021    HPI: [name] is a 42 y.o. female with a past medical history of. Breast Ca (Dx4/2024) The patient presented for initial endocrinology clinic visit on 12/09/2021 for consultative assistance with her low TSH.   Patient has been noted with low TSH since September 2012 with a TSH at 0.129u IU/mL, with normal free T4.  TRAb was undetectable Thyroid ultrasound on 12/23/2021 revealed multinodular goiter with a right superior 1.5 cm nodule meeting FNA criteria  We opted to start methimazole due to multiple symptoms 11/2021   Mother and maternal aunt with thyroid disease   SUBJECTIVE:    Today (04/02/23):  [name] is here for a follow up on MNG and subclinical hyperthyroidism  The patient has not had her FNA of the right superior nodule yet because she declined due to extreme fear of needles   Since her last visit here, unfortunately, she was diagnosed with right breast cancer 09/2022, s/p right lumpectomy, received adjuvant chemotherapy  She was admitted for COVID infection 02/2023 with lactic acidosis   She denies local neck swelling  Has alternating constipation with diarrhea   Has occasional palpitations  Hand  tremors are  improving  No Biotin intake    Methimazole 5 mg , 1 tab 5 days a week   HISTORY:  Past Medical History:  Past Medical History:  Diagnosis Date   Anxiety    Arthritis    Breast cancer (HCC)    Chlamydia contact, treated    GERD (gastroesophageal reflux disease)    Hyperthyroidism    Past Surgical History:  Past Surgical History:  Procedure Laterality Date   AXILLARY SENTINEL NODE BIOPSY Right 12/01/2022   Procedure: RIGHT AXILLARY SENTINEL NODE BIOPSY;  Surgeon: Laurie Miyamoto, MD;  Location: [facility];   Service: General;  Laterality: Right;   BREAST BIOPSY Right 10/02/2022   MM RT BREAST BX W LOC DEV 1ST LESION IMAGE BX SPEC STEREO GUIDE 10/02/2022 [facility]   BREAST BIOPSY  11/10/2022   MM RT RADIOACTIVE SEED LOC MAMMO GUIDE 11/10/2022 [facility]   BREAST LUMPECTOMY WITH RADIOACTIVE SEED LOCALIZATION Right 11/13/2022   Procedure: RIGHT BREAST LUMPECTOMY WITH RADIOACTIVE SEED LOCALIZATION;  Surgeon: Laurie Miyamoto, MD;  Location: [facility];  Service: General;  Laterality: Right;   ENDOMETRIAL ABLATION N/A 04/25/2017   Procedure: ENDOMETRIAL ABLATION With NOVASURE;  Surgeon: Laurie Phenix, MD;  Location: [facility];  Service: Gynecology;  Laterality: N/A;   ESOPHAGEAL MANOMETRY N/A 07/27/2021   Procedure: ESOPHAGEAL MANOMETRY (EM);  Surgeon: Laurie Burn, MD;  Location: [facility];  Service: Gastroenterology;  Laterality: N/A;   PORTACATH PLACEMENT N/A 12/01/2022   Procedure: INSERTION PORT-A-CATH WITH ULTRASOUND GUIDANCE;  Surgeon: Laurie Miyamoto, MD;  Location: [facility];  Service: General;  Laterality: N/A;   RE-EXCISION OF BREAST CANCER,SUPERIOR MARGINS Right 12/01/2022   Procedure: RE-EXCISION OF RIGHT BREAST CANCER;  Surgeon: Laurie Miyamoto, MD;  Location: [facility];  Service: General;  Laterality: Right;   TUBAL LIGATION  2003    Social History:  reports that she has never smoked. She has never used smokeless tobacco. She reports that she does not currently use drugs after having used the following drugs: Marijuana. She reports that she  does not drink alcohol. Family History: family history includes Breast cancer in her maternal grandmother and paternal grandmother; Hypertension in her maternal aunt and mother; Stomach cancer in her maternal grandfather.   HOME MEDICATIONS: Allergies as of 04/02/2023       Reactions   Latex Other (See Comments)   Bell in vaginal area with latex condoms   Tramadol Other (See Comments)   States "it  messes me up" no further info        Medication List        Accurate as of April 02, 2023  8:50 AM. If you have any questions, ask your nurse or doctor.          acetaminophen 500 MG tablet Commonly known as: TYLENOL Take 1,000 mg by mouth every 6 (six) hours as needed for moderate pain.   albuterol 108 (90 Base) MCG/ACT inhaler Commonly known as: VENTOLIN HFA TAKE 2 PUFFS BY MOUTH EVERY 6 HOURS AS NEEDED FOR WHEEZE OR SHORTNESS OF BREATH What changed: See the new instructions.   benzonatate 200 MG capsule Commonly known as: TESSALON Take 1 capsule (200 mg total) by mouth 3 (three) times daily as needed for cough.   cyclobenzaprine 10 MG tablet Commonly known as: FLEXERIL TAKE 1 TABLET BY MOUTH AT BEDTIME AS NEEDED FOR MUSCLE SPASMS What changed: See the new instructions.   esomeprazole 40 MG capsule Commonly known as: NEXIUM Take 1 capsule (40 mg total) by mouth 2 (two) times daily before a meal.   fluconazole 150 MG tablet Commonly known as: Diflucan Take 1 tablet (150 mg total) by mouth every three (3) days as needed (Yeast infection).   fluconazole 150 MG tablet Commonly known as: DIFLUCAN Take 1 tablet (150 mg total) by mouth every other day as needed.   gabapentin 400 MG capsule Commonly known as: Neurontin Take 400 mg twice a day (morning and noon) and 800 mg at bedtime.   HYDROcodone bit-homatropine 5-1.5 MG/5ML syrup Commonly known as: HYCODAN Take 5 mLs by mouth every 6 (six) hours as needed for cough.   hydrocortisone 2.5 % rectal cream Commonly known as: ANUSOL-HC Place rectally 4 (four) times daily as needed for hemorrhoids or anal itching.   hydrOXYzine 25 MG tablet Commonly known as: ATARAX TAKE 1 TABLET(25 MG) BY MOUTH THREE TIMES DAILY   lidocaine 2 % solution Commonly known as: XYLOCAINE Use as directed 15 mLs in the mouth or throat every 4 (four) hours as needed for mouth pain.   lidocaine-prilocaine cream Commonly known as:  EMLA Apply topically daily.   linaclotide 145 MCG Caps capsule Commonly known as: Linzess Take 1 capsule (145 mcg total) by mouth daily before breakfast.   methimazole 5 MG tablet Commonly known as: TAPAZOLE Take 1 tablet (5 mg total) by mouth as directed. 1 tablet Monday through Friday, skip Saturdays and  Sundays   metoprolol succinate 25 MG 24 hr tablet Commonly known as: TOPROL-XL Take 0.5 tablets (12.5 mg total) by mouth daily. For increased heart rate   metroNIDAZOLE 0.75 % vaginal gel Commonly known as: METROGEL Place 1 Applicatorful vaginally at bedtime. Use for 5 nights.   metroNIDAZOLE 500 MG tablet Commonly known as: FLAGYL Take 1,000 mg by mouth 2 (two) times daily.   naproxen 500 MG tablet Commonly known as: NAPROSYN TAKE 1 TABLET BY MOUTH TWICE A DAY AS NEEDED FOR PAIN   ondansetron 8 MG tablet Commonly known as: Zofran Take 1 tab (8 mg) by mouth every 8 hrs as  needed for nausea/vomiting. Start third day after doxorubicin/cyclophosphamide chemotherapy.   prochlorperazine 10 MG tablet Commonly known as: COMPAZINE Take 1 tablet (10 mg total) by mouth every 6 (six) hours as needed for nausea or vomiting.   zolpidem 5 MG tablet Commonly known as: AMBIEN Take 1 tablet (5 mg total) by mouth at bedtime as needed for sleep.          REVIEW OF SYSTEMS: A comprehensive ROS was conducted with the patient and is negative except as per HPI    OBJECTIVE:  VS: BP 114/72 (BP Location: Left Arm, Patient Position: Sitting, Cuff Size: Large)   Pulse 70   Ht 5\' 6"  (1.676 m)   Wt 214 lb 3.2 oz (97.2 kg)   SpO2 99%   BMI 34.57 kg/m    Wt Readings from Last 3 Encounters:  04/02/23 214 lb 3.2 oz (97.2 kg)  03/29/23 213 lb 4.8 oz (96.8 kg)  03/27/23 211 lb (95.7 kg)     EXAM: General: Pt appears well and is in NAD  Neck: General: Supple without adenopathy. Thyroid: Right thyroid asymmetry noted but unable to appreciate nodules per se  Lungs: Clear with good BS  bilat with no rales, rhonchi, or wheezes  Heart: Auscultation: RRR.  Extremities:  BL LE: No pretibial edema normal ROM and strength.  Mental Status: Judgment, insight: Intact Orientation: Oriented to time, place, and person Mood and affect: No depression, anxiety, or agitation     DATA REVIEWED:    Latest Reference Range & Units 03/27/23 12:37  Sodium 135 - 145 mmol/L 140  Potassium 3.5 - 5.1 mmol/L 3.5  Chloride 98 - 111 mmol/L 106  CO2 22 - 32 mmol/L 26  Glucose 70 - 99 mg/dL 528 (H)  BUN 6 - 20 mg/dL 12  Creatinine 4.13 - 2.44 mg/dL 0.10  Calcium 8.9 - 27.2 mg/dL 9.6  Anion gap 5 - 15  8  Alkaline Phosphatase 38 - 126 U/L 114  Albumin 3.5 - 5.0 g/dL 4.2  AST 15 - 41 U/L 37  ALT 0 - 44 U/L 71 (H)  Total Protein 6.5 - 8.1 g/dL 7.3  Total Bilirubin 0.3 - 1.2 mg/dL 0.6  GFR, Est Non African American >60 mL/min >60     Thyroid ultrasound 10/23/2022 Estimated total number of nodules >/= 1 cm: 2   Number of spongiform nodules >/=  2 cm not described below (TR1): 0   Number of mixed cystic and solid nodules >/= 1.5 cm not described below (TR2): 0   _________________________________________________________   Nodule labeled 1 is a solid hypoechoic nodule with lobulated/irregular margins and focus of macrocalcification (TR 5) in the mid right thyroid lobe measuring 2.0 x 1.5 x 1.2 cm, previously measuring up to 1.5 cm. It appears slightly enlarged. **Given size (>/= 1.0 cm) and appearance, fine needle aspiration of this highly suspicious nodule should be considered based on TI-RADS criteria.   Nodule labeled 2 appears to be best described as a solid hypoechoic nodule with punctate echogenic foci (TR 5) in the inferior right thyroid lobe that measures 0.7 x 0.6 x 0.5 cm. It demonstrates more discrete features on today's exam, and has been upgraded to TR 5. *Given size (>/= 0.5 - 0.9 cm) and appearance, a follow-up ultrasound in 1 year should be considered based on  TI-RADS criteria.   Nodule labeled 3 appears to be best described as a solid hypoechoic nodule with irregular/lobulated margins (TR 4) in the inferior right thyroid lobe measuring 1.1  x 0.8 x 0.6 cm. It again demonstrates more discrete features on today's exam, and has been upgraded to TR 4. *Given size (>/= 1 - 1.4 cm) and appearance, a follow-up ultrasound in 1 year should be considered based on TI-RADS criteria.   IMPRESSION: 1. Multinodular thyroid gland. Several nodules demonstrate more discrete features on today's exam, and have been upgraded with corresponding recommendations as detailed. 2. Nodule labeled 1 in the mid right thyroid lobe (2.0 cm TR 5) appears to demonstrate interval enlargement, and continues to meet criteria for biopsy as previously recommended. 3. Nodules labeled 2 and 3, both in the inferior right thyroid lobe, demonstrate more conspicuous features on today's exam, now warranting follow-up ultrasound in 1 year. A total follow-up interval of 5 years is recommended.   ASSESSMENT/PLAN/RECOMMENDATIONS:   Subclinical Hyperthyroidism:  -She was started on methimazole in June 2023 due to multiple symptoms, TFTs normalized on repeat labs in August -Patient is clinically euthyroid  Medications : Decrease methimazole 5 mg , 5 days a week ( skips Saturday and Sundays)     2. Multinodular Goiter:  - No local neck symptoms  -Thyroid ultrasound on 12/23/2021 revealed multinodular goiter with a right superior 1.5 cm nodule meeting FNA criteria, patient declined FNA due to being very fearful of needles -Thyroid nodule increase in size by 10/2022 with microcalcifications, she wanted to postpone the FNA due to pending breast biopsy -I have encouraged the patient to proceed with FNA due to enlargement, patient agreed to an FNA, new order has been placed    Follow-up in 6 months  Signed electronically by: Lyndle Herrlich, MD  [facility]  [facility] [address] [place] [place], [place] [address] Phone: [phone] FAX: [phone]   CC: Laurie Register, MD [address] [place] [address] [place] [address] Phone: [phone] Fax: [phone]   Return to Endocrinology clinic as below: Future Appointments  Date Time Provider Department Center  04/03/2023 11:00 AM [facility] FLUSH [facility] None  04/03/2023 11:40 AM Georga Kaufmann T, PA-C [facility] None  04/03/2023 12:30 PM [facility] INFUSION [facility] None  04/04/2023  2:30 PM [facility] US OB 1 [facility]  04/04/2023  3:15 PM Lo, Toma Aran, CNM [facility]  04/10/2023 10:15 AM [facility] FLUSH [facility] None  04/10/2023 10:45 AM Serena Croissant, MD [facility] None  04/10/2023 11:45 AM [facility] INFUSION [facility] None  04/17/2023 10:30 AM [facility] FLUSH [facility] None  04/17/2023 11:15 AM Loa Socks, NP [facility] None  04/17/2023 11:45 AM [facility] INFUSION [facility] None  04/24/2023 10:15 AM [facility] FLUSH [facility] None  04/24/2023 10:45 AM [name], [name], NP [facility] None  04/24/2023 11:30 AM [facility] INFUSION [facility] None  05/02/2023 11:15 AM [facility] FLUSH [facility] None  05/02/2023 12:00 PM Serena Croissant, MD [facility] None  05/02/2023 12:30 PM [facility] INFUSION [facility] None  05/07/2023 11:30 AM Jenel Lucks D, LCSW [facility] None  05/09/2023 11:15 AM [facility] FLUSH [facility] None  05/09/2023 11:45 AM Serena Croissant, MD [facility] None  05/09/2023 12:30 PM [facility] INFUSION [facility] None  05/16/2023  9:15 AM [facility] FLUSH [facility] None  05/16/2023  9:45 AM Serena Croissant, MD [facility] None  05/16/2023 10:30 AM [facility] INFUSION [facility] None  06/04/2023  3:10 PM Alphonzo Cruise, PTA [facility] None  06/11/2023  3:10 PM Laurie Register, MD [facility] None

## 2023-04-03 ENCOUNTER — Inpatient Hospital Stay: Payer: Medicaid Other

## 2023-04-03 ENCOUNTER — Inpatient Hospital Stay: Payer: Medicaid Other | Admitting: Physician Assistant

## 2023-04-03 ENCOUNTER — Encounter: Payer: Self-pay | Admitting: Hematology and Oncology

## 2023-04-03 ENCOUNTER — Other Ambulatory Visit: Payer: Self-pay

## 2023-04-03 VITALS — BP 119/88 | HR 95 | Temp 98.1°F | Resp 17 | Wt 215.5 lb

## 2023-04-03 DIAGNOSIS — C50411 Malignant neoplasm of upper-outer quadrant of right female breast: Secondary | ICD-10-CM

## 2023-04-03 DIAGNOSIS — G629 Polyneuropathy, unspecified: Secondary | ICD-10-CM | POA: Diagnosis not present

## 2023-04-03 DIAGNOSIS — Z95828 Presence of other vascular implants and grafts: Secondary | ICD-10-CM

## 2023-04-03 DIAGNOSIS — Z5111 Encounter for antineoplastic chemotherapy: Secondary | ICD-10-CM

## 2023-04-03 DIAGNOSIS — Z171 Estrogen receptor negative status [ER-]: Secondary | ICD-10-CM

## 2023-04-03 LAB — CMP (CANCER CENTER ONLY)
ALT: 35 U/L (ref 0–44)
AST: 21 U/L (ref 15–41)
Albumin: 3.9 g/dL (ref 3.5–5.0)
Alkaline Phosphatase: 116 U/L (ref 38–126)
Anion gap: 5 (ref 5–15)
BUN: 9 mg/dL (ref 6–20)
CO2: 27 mmol/L (ref 22–32)
Calcium: 9.3 mg/dL (ref 8.9–10.3)
Chloride: 108 mmol/L (ref 98–111)
Creatinine: 0.73 mg/dL (ref 0.44–1.00)
GFR, Estimated: >60 mL/min (ref >60)
Glucose, Bld: 104 mg/dL — ABNORMAL HIGH (ref 70–99)
Potassium: 3.8 mmol/L (ref 3.5–5.1)
Sodium: 140 mmol/L (ref 135–145)
Total Bilirubin: 0.3 mg/dL (ref 0.3–1.2)
Total Protein: 6.9 g/dL (ref 6.5–8.1)

## 2023-04-03 LAB — CBC WITH DIFFERENTIAL (CANCER CENTER ONLY)
Abs Immature Granulocytes: 0.02 10*3/uL (ref 0.00–0.07)
Basophils Absolute: 0 10*3/uL (ref 0.0–0.1)
Basophils Relative: 1 %
Eosinophils Absolute: 0.1 10*3/uL (ref 0.0–0.5)
Eosinophils Relative: 3 %
HCT: 33.7 % — ABNORMAL LOW (ref 36.0–46.0)
Hemoglobin: 11.5 g/dL — ABNORMAL LOW (ref 12.0–15.0)
Immature Granulocytes: 1 %
Lymphocytes Relative: 41 %
Lymphs Abs: 1.7 10*3/uL (ref 0.7–4.0)
MCH: 30.2 pg (ref 26.0–34.0)
MCHC: 34.1 g/dL (ref 30.0–36.0)
MCV: 88.5 fL (ref 80.0–100.0)
Monocytes Absolute: 0.4 10*3/uL (ref 0.1–1.0)
Monocytes Relative: 10 %
Neutro Abs: 1.9 10*3/uL (ref 1.7–7.7)
Neutrophils Relative %: 44 %
Platelet Count: 262 10*3/uL (ref 150–400)
RBC: 3.81 MIL/uL — ABNORMAL LOW (ref 3.87–5.11)
RDW: 15.3 % (ref 11.5–15.5)
WBC Count: 4.1 10*3/uL (ref 4.0–10.5)
nRBC: 0 % (ref 0.0–0.2)

## 2023-04-03 MED ORDER — FAMOTIDINE IN NACL 20-0.9 MG/50ML-% IV SOLN
20.0000 mg | Freq: Once | INTRAVENOUS | Status: AC
Start: 2023-04-03 — End: 2023-04-03
  Administered 2023-04-03: 20 mg via INTRAVENOUS
  Filled 2023-04-03: qty 50

## 2023-04-03 MED ORDER — OXYCODONE HCL 5 MG PO TABS
5.0000 mg | ORAL_TABLET | Freq: Once | ORAL | Status: AC
  Administered 2023-04-03: 5 mg via ORAL
  Filled 2023-04-03: qty 1

## 2023-04-03 MED ORDER — PALONOSETRON HCL INJECTION 0.25 MG/5ML
0.2500 mg | Freq: Once | INTRAVENOUS | Status: AC
  Administered 2023-04-03: 0.25 mg via INTRAVENOUS
  Filled 2023-04-03: qty 5

## 2023-04-03 MED ORDER — SODIUM CHLORIDE 0.9% FLUSH
10.0000 mL | INTRAVENOUS | Status: DC | PRN
  Administered 2023-04-03: 10 mL

## 2023-04-03 MED ORDER — SODIUM CHLORIDE 0.9 % IV SOLN
50.0000 mg/m2 | Freq: Once | INTRAVENOUS | Status: AC
  Administered 2023-04-03: 108 mg via INTRAVENOUS
  Filled 2023-04-03: qty 18

## 2023-04-03 MED ORDER — SODIUM CHLORIDE 0.9 % IV SOLN: Freq: Once | INTRAVENOUS | Status: AC

## 2023-04-03 MED ORDER — DEXAMETHASONE SODIUM PHOSPHATE 10 MG/ML IJ SOLN
10.0000 mg | Freq: Once | INTRAMUSCULAR | Status: AC
Start: 2023-04-03 — End: 2023-04-03
  Administered 2023-04-03: 10 mg via INTRAVENOUS
  Filled 2023-04-03: qty 1

## 2023-04-03 MED ORDER — HEPARIN SOD (PORK) LOCK FLUSH 100 UNIT/ML IV SOLN
500.0000 [IU] | Freq: Once | INTRAVENOUS | Status: AC | PRN
  Administered 2023-04-03: 500 [IU]

## 2023-04-03 MED ORDER — ZOLPIDEM TARTRATE 5 MG PO TABS: 5.0000 mg | ORAL_TABLET | Freq: Every evening | ORAL | 1 refills | Status: DC | PRN

## 2023-04-03 MED ORDER — SODIUM CHLORIDE 0.9% FLUSH
10.0000 mL | Freq: Once | INTRAVENOUS | Status: AC
  Administered 2023-04-03: 10 mL

## 2023-04-03 MED ORDER — METHIMAZOLE 5 MG PO TABS: 5.0000 mg | ORAL_TABLET | ORAL | 3 refills | Status: DC

## 2023-04-03 MED ORDER — DIPHENHYDRAMINE HCL 50 MG/ML IJ SOLN
25.0000 mg | Freq: Once | INTRAMUSCULAR | Status: AC
  Administered 2023-04-03: 25 mg via INTRAVENOUS
  Filled 2023-04-03: qty 1

## 2023-04-03 NOTE — Patient Instructions (Signed)
[place] [facility]  Discharge Instructions: Thank you for choosing [facility] to provide your oncology and hematology care.   If you have a lab appointment with the Cancer Center, please go directly to the Cancer Center and check in at the registration area.   Wear comfortable clothing and clothing appropriate for easy access to any Portacath or PICC line.   We strive to give you quality time with your provider. You may need to reschedule your appointment if you arrive late (15 or more minutes).  Arriving late affects you and other patients whose appointments are after yours.  Also, if you miss three or more appointments without notifying the office, you may be dismissed from the clinic at the provider's discretion.      For prescription refill requests, have your pharmacy contact our office and allow 72 hours for refills to be completed.    Today you received the following chemotherapy and/or immunotherapy agents Taxol      To help prevent nausea and vomiting after your treatment, we encourage you to take your nausea medication as directed.  BELOW ARE SYMPTOMS THAT SHOULD BE REPORTED IMMEDIATELY: *FEVER GREATER THAN 100.4 F (38 C) OR HIGHER *CHILLS OR SWEATING *NAUSEA AND VOMITING THAT IS NOT CONTROLLED WITH YOUR NAUSEA MEDICATION *UNUSUAL SHORTNESS OF BREATH *UNUSUAL BRUISING OR BLEEDING *URINARY PROBLEMS (pain or burning when urinating, or frequent urination) *BOWEL PROBLEMS (unusual diarrhea, constipation, pain near the anus) TENDERNESS IN MOUTH AND THROAT WITH OR WITHOUT PRESENCE OF ULCERS (sore throat, sores in mouth, or a toothache) UNUSUAL RASH, SWELLING OR PAIN  UNUSUAL VAGINAL DISCHARGE OR ITCHING   Items with * indicate a potential emergency and should be followed up as soon as possible or go to the Emergency Department if any problems should occur.  Please show the CHEMOTHERAPY ALERT CARD or IMMUNOTHERAPY ALERT CARD at check-in  to the Emergency Department and triage nurse.  Should you have questions after your visit or need to cancel or reschedule your appointment, please contact [place] [facility]  Dept: [phone]  and follow the prompts.  Office hours are 8:00 a.m. to 4:30 p.m. Monday - Friday. Please note that voicemails left after 4:00 p.m. may not be returned until the following business day.  We are closed weekends and major holidays. You have access to a nurse at all times for urgent questions. Please call the main number to the clinic Dept: [phone] and follow the prompts.   For any non-urgent questions, you may also contact your provider using MyChart. We now offer e-Visits for anyone 18 and older to request care online for non-urgent symptoms. For details visit [email or web address].   Also download the MyChart app! Go to the app store, search "MyChart", open the app, select [place], and log in with your MyChart username and password.   

## 2023-04-03 NOTE — Progress Notes (Signed)
Patient Care Team: Laurie Register, MD as PCP - General (Family Medicine) Laurie Crafts, MD as PCP - Cardiology (Cardiology) Laurie Chard, DO as Consulting Physician (Neurology) Laurie Miyamoto, MD as Consulting Physician (General Surgery) Laurie Croissant, MD as Consulting Physician (Hematology and Oncology) Laurie Peak, MD as Attending Physician (Radiation Oncology) Laurie Proud, RN as Oncology Nurse Navigator Laurie Angelica, RN as Oncology Nurse Navigator  DIAGNOSIS:  Encounter Diagnosis  Name Primary?   Malignant neoplasm of upper-outer quadrant of right breast in female, estrogen receptor negative (HCC)     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, ER 30% weak, PR 2%   10/11/2022 Cancer Staging   Staging form: Breast, AJCC 8th Edition - Clinical: Stage 0 (cTis (DCIS), cN0, cM0, G3, ER+, PR+, HER2: Not Assessed) - Signed by Laurie Croissant, MD on 10/11/2022 Stage prefix: Initial diagnosis Histologic grading system: 3 grade system   10/11/2022 Genetic Testing   Declined hereditary cancer genetic testing    11/13/2022 Surgery   Right lumpectomy: Grade 3 IDC, 2 cm in size inferior and posterior margins are positive, DCIS posterior margin positive, lymphovascular invasion not identified, ER 0%, PR 0%, Ki-67 85%, HER2 0 by [facility]   12/01/2022 Surgery   Margin excision: Posterior margin: Scattered foci of high-grade DCIS, 0/2 lymph nodes negative   01/01/2023 -  Chemotherapy   Patient is on Treatment Plan : BREAST ADJUVANT DOSE DENSE AC q14d / PACLitaxel q7d      CURRENT TREATMENT: Cycle 6 of weekly Taxol  INTERVAL HISTORY: Laurie Bell returns for a follow up prior to weekly  taxol treatment. She is unaccompanied for this visit.   She reports that she is having some pain at the port site after access today. She reports reports her energy and appetite are overall stable. She denies nausea, vomiting or bowel habit changes. She denies easy bruising or signs of active bleeding. She reports having persistent neuropathic pain in her feet and hands that is unchanged from the last treatment. She is currently taking gabapentin 400 mg in the morning and afternoon along with 800 mg in the evening with minimal relief. She denies any interference with grip/dexterity or balance at this time. She denies fevers, chills, sweats, shortness of breath, chest pain or cough. She has no other complaints. Rest of the ROS is below.   Past Medical History:  Diagnosis Date   Anxiety    Arthritis    Breast cancer (HCC)    Chlamydia contact, treated    GERD (gastroesophageal reflux disease)    Hyperthyroidism    Social History   Socioeconomic History   Marital status: Single    Spouse name: Not on file   Number of children: 2   Years of education: Not on file   Highest education level: Not on file  Occupational History   Occupation: Event organiser: [employer]  Tobacco Use   Smoking status: Never   Smokeless tobacco: Never  Vaping Use   Vaping status: Never Used  Substance and Sexual Activity   Alcohol use: No   Drug use: Not Currently    Types: Marijuana    Comment: quit 2018   Sexual activity: Not Currently    Birth control/protection: Surgical  Other Topics Concern   Not on file  Social History Narrative   ** Merged History Encounter **       Right Handed  Lives in a one story home    Social Determinants of Health   Financial Resource Strain: Medium Risk (11/03/2021)   Overall Financial Resource Strain (CARDIA)    Difficulty of Paying Living Expenses: Somewhat hard  Food Insecurity: No Food Insecurity (02/23/2023)   Hunger Vital Sign    Worried About Running  Out of Food in the Last Year: Never true    Ran Out of Food in the Last Year: Never true  Transportation Needs: No Transportation Needs (02/23/2023)   PRAPARE - Administrator, Civil Service (Medical): No    Lack of Transportation (Non-Medical): No  Physical Activity: Sufficiently Active (11/03/2021)   Exercise Vital Sign    Days of Exercise per Week: 3 days    Minutes of Exercise per Session: 50 min  Stress: Stress Concern Present (11/03/2021)   [employer] of Occupational Health - Occupational Stress Questionnaire    Feeling of Stress : Very much  Social Connections: Moderately Isolated (11/03/2021)   Social Connection and Isolation Panel [NHANES]    Frequency of Communication with Friends and Family: Once a week    Frequency of Social Gatherings with Friends and Family: Once a week    Attends Religious Services: More than 4 times per year    Active Member of [employer] or Organizations: No    Attends Banker Meetings: Never    Marital Status: Living with partner   Past Surgical History:  Procedure Laterality Date   AXILLARY SENTINEL NODE BIOPSY Right 12/01/2022   Procedure: RIGHT AXILLARY SENTINEL NODE BIOPSY;  Surgeon: Laurie Miyamoto, MD;  Location: [facility];  Service: General;  Laterality: Right;   BREAST BIOPSY Right 10/02/2022   MM RT BREAST BX W LOC DEV 1ST LESION IMAGE BX SPEC STEREO GUIDE 10/02/2022 [facility]   BREAST BIOPSY  11/10/2022   MM RT RADIOACTIVE SEED LOC MAMMO GUIDE 11/10/2022 [facility]   BREAST LUMPECTOMY WITH RADIOACTIVE SEED LOCALIZATION Right 11/13/2022   Procedure: RIGHT BREAST LUMPECTOMY WITH RADIOACTIVE SEED LOCALIZATION;  Surgeon: Laurie Miyamoto, MD;  Location: [facility];  Service: General;  Laterality: Right;   ENDOMETRIAL ABLATION N/A 04/25/2017   Procedure: ENDOMETRIAL ABLATION With NOVASURE;  Surgeon: Adam Phenix, MD;  Location: [facility];  Service: Gynecology;  Laterality: N/A;    ESOPHAGEAL MANOMETRY N/A 07/27/2021   Procedure: ESOPHAGEAL MANOMETRY (EM);  Surgeon: Imogene Burn, MD;  Location: [facility];  Service: Gastroenterology;  Laterality: N/A;   PORTACATH PLACEMENT N/A 12/01/2022   Procedure: INSERTION PORT-A-CATH WITH ULTRASOUND GUIDANCE;  Surgeon: Laurie Miyamoto, MD;  Location: [facility];  Service: General;  Laterality: N/A;   RE-EXCISION OF BREAST CANCER,SUPERIOR MARGINS Right 12/01/2022   Procedure: RE-EXCISION OF RIGHT BREAST CANCER;  Surgeon: Laurie Miyamoto, MD;  Location: [facility];  Service: General;  Laterality: Right;   TUBAL LIGATION  2003   Family History  Problem Relation Age of Onset   Hypertension Mother    Hypertension Maternal Aunt    Breast cancer Maternal Grandmother        dx > 50   Stomach cancer Maternal Grandfather        dx > 50   Breast cancer Paternal Grandmother        dx > 50   Esophageal cancer Neg Hx    Rectal cancer Neg  Hx    Colon cancer Neg Hx     ALLERGIES:  is allergic to latex and tramadol.  MEDICATIONS:  Current Outpatient Medications  Medication Sig Dispense Refill   acetaminophen (TYLENOL) 500 MG tablet Take 1,000 mg by mouth every 6 (six) hours as needed for moderate pain.     albuterol (VENTOLIN HFA) 108 (90 Base) MCG/ACT inhaler TAKE 2 PUFFS BY MOUTH EVERY 6 HOURS AS NEEDED FOR WHEEZE OR SHORTNESS OF BREATH (Patient taking differently: Inhale 2 puffs into the lungs every 6 (six) hours as needed for wheezing or shortness of breath.) 54 each 0   benzonatate (TESSALON) 200 MG capsule Take 1 capsule (200 mg total) by mouth 3 (three) times daily as needed for cough. 21 capsule 0   cyclobenzaprine (FLEXERIL) 10 MG tablet TAKE 1 TABLET BY MOUTH AT BEDTIME AS NEEDED FOR MUSCLE SPASMS (Patient taking differently: Take 10 mg by mouth at bedtime.) 30 tablet 1   esomeprazole (NEXIUM) 40 MG capsule Take 1 capsule (40 mg total) by mouth 2 (two) times daily before a meal. 60 capsule 3   fluconazole (DIFLUCAN) 150 MG tablet  Take 1 tablet (150 mg total) by mouth every three (3) days as needed (Yeast infection). 3 tablet 0   fluconazole (DIFLUCAN) 150 MG tablet Take 1 tablet (150 mg total) by mouth every other day as needed. 2 tablet 2   gabapentin (NEURONTIN) 400 MG capsule Take 400 mg twice a day (morning and noon) and 800 mg at bedtime. 56 capsule 0   HYDROcodone bit-homatropine (HYCODAN) 5-1.5 MG/5ML syrup Take 5 mLs by mouth every 6 (six) hours as needed for cough. 120 mL 0   hydrocortisone (ANUSOL-HC) 2.5 % rectal cream Place rectally 4 (four) times daily as needed for hemorrhoids or anal itching. 30 g 0   hydrOXYzine (ATARAX) 25 MG tablet TAKE 1 TABLET(25 MG) BY MOUTH THREE TIMES DAILY 270 tablet 1   lidocaine (XYLOCAINE) 2 % solution Use as directed 15 mLs in the mouth or throat every 4 (four) hours as needed for mouth pain.     lidocaine-prilocaine (EMLA) cream Apply topically daily.     linaclotide (LINZESS) 145 MCG CAPS capsule Take 1 capsule (145 mcg total) by mouth daily before breakfast. 30 capsule 5   methimazole (TAPAZOLE) 5 MG tablet Take 1 tablet (5 mg total) by mouth as directed. 1 tablet Monday through Thursday,, skip Friday, Saturdays and  Sundays 52 tablet 3   metoprolol succinate (TOPROL-XL) 25 MG 24 hr tablet Take 0.5 tablets (12.5 mg total) by mouth daily. For increased heart rate 45 tablet 1   metroNIDAZOLE (FLAGYL) 500 MG tablet Take 1,000 mg by mouth 2 (two) times daily.     metroNIDAZOLE (METROGEL) 0.75 % vaginal gel Place 1 Applicatorful vaginally at bedtime. Use for 5 nights. 70 g 0   naproxen (NAPROSYN) 500 MG tablet TAKE 1 TABLET BY MOUTH TWICE A DAY AS NEEDED FOR PAIN 60 tablet 1   ondansetron (ZOFRAN) 8 MG tablet Take 1 tab (8 mg) by mouth every 8 hrs as needed for nausea/vomiting. Start third day after doxorubicin/cyclophosphamide chemotherapy. 30 tablet 1   prochlorperazine (COMPAZINE) 10 MG tablet Take 1 tablet (10 mg total) by mouth every 6 (six) hours as needed for nausea or  vomiting. 30 tablet 1   zolpidem (AMBIEN) 5 MG tablet Take 1 tablet (5 mg total) by mouth at bedtime as needed for sleep. 30 tablet 1   No current facility-administered medications for this visit.  Facility-Administered Medications Ordered in Other Visits  Medication Dose Route Frequency Provider Last Rate Last Admin   sodium chloride flush (NS) 0.9 % injection 10 mL  10 mL Intracatheter PRN Laurie Croissant, MD   10 mL at 04/03/23 1443    PHYSICAL EXAMINATION: ECOG PERFORMANCE STATUS: 1 - Symptomatic but completely ambulatory  Vitals:   04/03/23 1147  BP: 119/88  Pulse: 95  Resp: 17  Temp: 98.1 F (36.7 C)  SpO2: 100%   Filed Weights   04/03/23 1147  Weight: 215 lb 8 oz (97.8 kg)     Constitutional: Oriented to person, place, and time and well-developed, well-nourished, and in no distress.  HENT:  Head: Normocephalic and atraumatic.  Eyes: Conjunctivae are normal. Right eye exhibits no discharge. Left eye exhibits no discharge. No scleral icterus.  Neck: Normal range of motion. Neck supple.   Cardiovascular: Normal rate, regular rhythm, normal heart sounds  Pulmonary/Chest: Effort normal and breath sounds normal. No respiratory distress. No wheezes. No rales.   Musculoskeletal: Normal range of motion. Exhibits no edema.  Lymphadenopathy: No cervical adenopathy.  Neurological: Alert and oriented to person, place, and time. Exhibits normal muscle tone. Gait normal. Coordination normal.  Skin: Skin is warm and dry. No rash noted. Not diaphoretic. No erythema. No pallor.  Psychiatric: Mood, memory and judgment normal.   LABORATORY DATA:  I have reviewed the data as listed    Latest Ref Rng & Units 04/03/2023   11:11 AM 03/27/2023   12:37 PM 03/20/2023    1:48 PM  CMP  Glucose 70 - 99 mg/dL 161  096  045   BUN 6 - 20 mg/dL 9  12  9    Creatinine 0.44 - 1.00 mg/dL 4.09  8.11  9.14   Sodium 135 - 145 mmol/L 140  140  139   Potassium 3.5 - 5.1 mmol/L 3.8  3.5  3.8   Chloride  98 - 111 mmol/L 108  106  106   CO2 22 - 32 mmol/L 27  26  28    Calcium 8.9 - 10.3 mg/dL 9.3  9.6  9.3   Total Protein 6.5 - 8.1 g/dL 6.9  7.3  7.1   Total Bilirubin 0.3 - 1.2 mg/dL 0.3  0.6  0.4   Alkaline Phos 38 - 126 U/L 116  114  120   AST 15 - 41 U/L 21  37  29   ALT 0 - 44 U/L 35  71  55     Lab Results  Component Value Date   WBC 4.1 04/03/2023   HGB 11.5 (L) 04/03/2023   HCT 33.7 (L) 04/03/2023   MCV 88.5 04/03/2023   PLT 262 04/03/2023   NEUTROABS 1.9 04/03/2023    ASSESSMENT & PLAN:  Laurie Bell is a 42 y.o. female who returns for continued management of right breast cancer.   #Malignant neoplasm of upper-outer quadrant of right breast -10/02/2022: Initial biopsy was DCIS ER 30% PR 2% -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, DCIS posterior margin positive, lymphovascular invasion not identified, ER 0%, PR 0%, Ki-67 85%, HER2 0 by IHC Margin excision: Posterior margin: Scattered foci of high-grade DCIS, 0/2 lymph nodes negative, final margins negative  -Treatment plan includes adjuvant chemotherapy with dose dense Adriamycin and Cytoxan followed by Taxol and carboplatin then adjuvant radiation therapy -Completed 4 cycles of AC chemotherapy from 01/01/2023-02/14/2023. -Started weekly Taxol on 02/28/2023.  -Dose reduced Taxol from 80  mg/m2 to 65 mg/m2 with Cycle 2 on 03/06/2023 to prevent worsening leukopenia/neutropenia.  -Dose reduced Taxol again from 65 mg/m2 to 50 mg/m2 with Cycle 5 on 03/27/2023 due to neuropathy PLAN: --Due for Cycle 6 of weekly taxol --Labs from today were reviewed and adequate for treatment. WBC 4.1, Hgb 11.5, Plt 262, creatinine and LFTs normal.  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 No interference with grip or  balance --Currently taking gabapentin 400 mg in morning and afternoon, 800 mg in the evening. Since there was minimal improvement, suggested to take gabapentin 800 mg in the morning and 400 mg in the afternoon and evening.   #Port discomfort: --Secondary to port access today --Will give PO oxycodone 5 mg x once today  #Insomnia: --Well controlled with Ambien 5 mg PO nightly --Refill sent today    No orders of the defined types were placed in this encounter.  The patient has a good understanding of the overall plan. she agrees with it. she will call with any problems that may develop before the next visit here.   I have spent a total of 30 minutes minutes of face-to-face and non-face-to-face time, preparing to see the patient,  counseling and educating the patient, ordering medications/tests/procedures, documenting clinical information in the electronic health record, independently interpreting results and communicating results to the patient, and care coordination.   Georga Kaufmann PA-C Dept of Hematology and Oncology [facility] ([facility] Phone: [phone]

## 2023-04-04 ENCOUNTER — Encounter: Payer: Self-pay | Admitting: Hematology and Oncology

## 2023-04-04 ENCOUNTER — Ambulatory Visit (INDEPENDENT_AMBULATORY_CARE_PROVIDER_SITE_OTHER): Payer: Medicaid Other | Admitting: Certified Nurse Midwife

## 2023-04-04 ENCOUNTER — Ambulatory Visit (HOSPITAL_BASED_OUTPATIENT_CLINIC_OR_DEPARTMENT_OTHER): Payer: Medicaid Other

## 2023-04-04 ENCOUNTER — Encounter (HOSPITAL_BASED_OUTPATIENT_CLINIC_OR_DEPARTMENT_OTHER): Payer: Self-pay | Admitting: Certified Nurse Midwife

## 2023-04-04 VITALS — BP 131/78 | HR 72 | Ht 66.0 in | Wt 211.6 lb

## 2023-04-04 DIAGNOSIS — N930 Postcoital and contact bleeding: Secondary | ICD-10-CM

## 2023-04-04 DIAGNOSIS — A5901 Trichomonal vulvovaginitis: Secondary | ICD-10-CM | POA: Diagnosis not present

## 2023-04-04 NOTE — Progress Notes (Signed)
GYNECOLOGY  VISIT  CC:   Here for Laurie Bell due to post-coital bleeding  HPI: 42 y.o. G45P2000 Single Black or [place] American female here for follow-up Laurie Bell. Pt was evaluated here on 03/26/23 for post-coital bleeding. Her testing was positive for Trichomonas. States she completed her therapy and husband was treated as well. She hasn't noted any further vaginal bleeding but has not had intercourse either. Laurie Bell today: Anteverted uterus appears normal in size without evidence of focal abnormalities. EM 5-29mm. Bilateral ovaries wnl. Negative adnexa regions bilaterally. Negative cul de sac. No free fluid present. CXL wnl.    Past Medical History:  Diagnosis Date   Anxiety    Arthritis    Breast cancer (HCC)    Chlamydia contact, treated    GERD (gastroesophageal reflux disease)    Hyperthyroidism     MEDS:   Current Outpatient Medications on File Prior to Visit  Medication Sig Dispense Refill   acetaminophen (TYLENOL) 500 MG tablet Take 1,000 mg by mouth every 6 (six) hours as needed for moderate pain.     albuterol (VENTOLIN HFA) 108 (90 Base) MCG/ACT inhaler TAKE 2 PUFFS BY MOUTH EVERY 6 HOURS AS NEEDED FOR WHEEZE OR SHORTNESS OF BREATH (Patient taking differently: Inhale 2 puffs into the lungs every 6 (six) hours as needed for wheezing or shortness of breath.) 54 each 0   benzonatate (TESSALON) 200 MG capsule Take 1 capsule (200 mg total) by mouth 3 (three) times daily as needed for cough. 21 capsule 0   cyclobenzaprine (FLEXERIL) 10 MG tablet TAKE 1 TABLET BY MOUTH AT BEDTIME AS NEEDED FOR MUSCLE SPASMS (Patient taking differently: Take 10 mg by mouth at bedtime.) 30 tablet 1   esomeprazole (NEXIUM) 40 MG capsule Take 1 capsule (40 mg total) by mouth 2 (two) times daily before a meal. 60 capsule 3   fluconazole (DIFLUCAN) 150 MG tablet Take 1 tablet (150 mg total) by mouth every three (3) days as needed (Yeast infection). 3 tablet 0   fluconazole (DIFLUCAN) 150 MG tablet Take 1 tablet (150 mg  total) by mouth every other day as needed. 2 tablet 2   gabapentin (NEURONTIN) 400 MG capsule Take 400 mg twice a day (morning and noon) and 800 mg at bedtime. 56 capsule 0   HYDROcodone bit-homatropine (HYCODAN) 5-1.5 MG/5ML syrup Take 5 mLs by mouth every 6 (six) hours as needed for cough. 120 mL 0   hydrocortisone (ANUSOL-HC) 2.5 % rectal cream Place rectally 4 (four) times daily as needed for hemorrhoids or anal itching. 30 g 0   hydrOXYzine (ATARAX) 25 MG tablet TAKE 1 TABLET(25 MG) BY MOUTH THREE TIMES DAILY 270 tablet 1   lidocaine (XYLOCAINE) 2 % solution Use as directed 15 mLs in the mouth or throat every 4 (four) hours as needed for mouth pain.     lidocaine-prilocaine (EMLA) cream Apply topically daily.     linaclotide (LINZESS) 145 MCG CAPS capsule Take 1 capsule (145 mcg total) by mouth daily before breakfast. 30 capsule 5   methimazole (TAPAZOLE) 5 MG tablet Take 1 tablet (5 mg total) by mouth as directed. 1 tablet Monday through Thursday,, skip Friday, Saturdays and  Sundays 52 tablet 3   metoprolol succinate (TOPROL-XL) 25 MG 24 hr tablet Take 0.5 tablets (12.5 mg total) by mouth daily. For increased heart rate 45 tablet 1   metroNIDAZOLE (FLAGYL) 500 MG tablet Take 1,000 mg by mouth 2 (two) times daily.     metroNIDAZOLE (METROGEL) 0.75 % vaginal gel Place  1 Applicatorful vaginally at bedtime. Use for 5 nights. 70 g 0   naproxen (NAPROSYN) 500 MG tablet TAKE 1 TABLET BY MOUTH TWICE A DAY AS NEEDED FOR PAIN 60 tablet 1   ondansetron (ZOFRAN) 8 MG tablet Take 1 tab (8 mg) by mouth every 8 hrs as needed for nausea/vomiting. Start third day after doxorubicin/cyclophosphamide chemotherapy. 30 tablet 1   prochlorperazine (COMPAZINE) 10 MG tablet Take 1 tablet (10 mg total) by mouth every 6 (six) hours as needed for nausea or vomiting. 30 tablet 1   zolpidem (AMBIEN) 5 MG tablet Take 1 tablet (5 mg total) by mouth at bedtime as needed for sleep. 30 tablet 1   [DISCONTINUED]  medroxyPROGESTERone (PROVERA) 10 MG tablet Take 2 tablets by mouth daily (Patient not taking: Reported on 06/05/2019) 60 tablet 0   No current facility-administered medications on file prior to visit.    ALLERGIES: Latex and Tramadol   PHYSICAL EXAMINATION:    BP 131/78 (BP Location: Left Arm, Patient Position: Sitting, Cuff Size: Normal)   Pulse 72   Ht 5\' 6"  (1.676 m)   Wt 211 lb 9.6 oz (96 kg)   BMI 34.15 kg/m     General appearance: alert, cooperative and appears stated age         Assessment/Plan:  1. PCB (post coital bleeding) -Pt denies any vaginal spotting or bleeding at this time (has not had intercourse since 10/14). -Normal GYN Pelvic Laurie Bell today with no abnl noted  2. Trichomonas vaginitis -She & spouse were treated.  Letta Kocher

## 2023-04-09 ENCOUNTER — Other Ambulatory Visit: Payer: Self-pay | Admitting: Hematology and Oncology

## 2023-04-10 ENCOUNTER — Inpatient Hospital Stay: Payer: Medicaid Other

## 2023-04-10 ENCOUNTER — Encounter: Payer: Self-pay | Admitting: Hematology and Oncology

## 2023-04-10 ENCOUNTER — Inpatient Hospital Stay (HOSPITAL_BASED_OUTPATIENT_CLINIC_OR_DEPARTMENT_OTHER): Payer: Medicaid Other | Admitting: Hematology and Oncology

## 2023-04-10 VITALS — BP 128/90 | HR 103 | Temp 97.3°F | Resp 18 | Ht 66.0 in | Wt 214.5 lb

## 2023-04-10 VITALS — HR 91

## 2023-04-10 DIAGNOSIS — C50411 Malignant neoplasm of upper-outer quadrant of right female breast: Secondary | ICD-10-CM

## 2023-04-10 DIAGNOSIS — Z171 Estrogen receptor negative status [ER-]: Secondary | ICD-10-CM

## 2023-04-10 DIAGNOSIS — Z5111 Encounter for antineoplastic chemotherapy: Secondary | ICD-10-CM | POA: Diagnosis not present

## 2023-04-10 LAB — CBC WITH DIFFERENTIAL (CANCER CENTER ONLY)
Abs Immature Granulocytes: 0.03 10*3/uL (ref 0.00–0.07)
Basophils Absolute: 0.1 10*3/uL (ref 0.0–0.1)
Basophils Relative: 1 %
Eosinophils Absolute: 0.1 10*3/uL (ref 0.0–0.5)
Eosinophils Relative: 2 %
HCT: 33.9 % — ABNORMAL LOW (ref 36.0–46.0)
Hemoglobin: 11.8 g/dL — ABNORMAL LOW (ref 12.0–15.0)
Immature Granulocytes: 1 %
Lymphocytes Relative: 34 %
Lymphs Abs: 1.7 10*3/uL (ref 0.7–4.0)
MCH: 30.7 pg (ref 26.0–34.0)
MCHC: 34.8 g/dL (ref 30.0–36.0)
MCV: 88.3 fL (ref 80.0–100.0)
Monocytes Absolute: 0.5 10*3/uL (ref 0.1–1.0)
Monocytes Relative: 9 %
Neutro Abs: 2.6 10*3/uL (ref 1.7–7.7)
Neutrophils Relative %: 53 %
Platelet Count: 251 10*3/uL (ref 150–400)
RBC: 3.84 MIL/uL — ABNORMAL LOW (ref 3.87–5.11)
RDW: 14.5 % (ref 11.5–15.5)
WBC Count: 4.9 10*3/uL (ref 4.0–10.5)
nRBC: 0 % (ref 0.0–0.2)

## 2023-04-10 LAB — CMP (CANCER CENTER ONLY)
ALT: 37 U/L (ref 0–44)
AST: 21 U/L (ref 15–41)
Albumin: 4.1 g/dL (ref 3.5–5.0)
Alkaline Phosphatase: 110 U/L (ref 38–126)
Anion gap: 7 (ref 5–15)
BUN: 10 mg/dL (ref 6–20)
CO2: 27 mmol/L (ref 22–32)
Calcium: 9.5 mg/dL (ref 8.9–10.3)
Chloride: 107 mmol/L (ref 98–111)
Creatinine: 0.73 mg/dL (ref 0.44–1.00)
GFR, Estimated: >60 mL/min (ref >60)
Glucose, Bld: 96 mg/dL (ref 70–99)
Potassium: 3.8 mmol/L (ref 3.5–5.1)
Sodium: 141 mmol/L (ref 135–145)
Total Bilirubin: 0.3 mg/dL (ref 0.3–1.2)
Total Protein: 7 g/dL (ref 6.5–8.1)

## 2023-04-10 LAB — PREGNANCY, URINE: Preg Test, Ur: NEGATIVE

## 2023-04-10 MED ORDER — HEPARIN SOD (PORK) LOCK FLUSH 100 UNIT/ML IV SOLN
500.0000 [IU] | Freq: Once | INTRAVENOUS | Status: AC | PRN
Start: 2023-04-10 — End: 2023-04-10
  Administered 2023-04-10: 500 [IU]

## 2023-04-10 MED ORDER — DIPHENHYDRAMINE HCL 50 MG/ML IJ SOLN
25.0000 mg | Freq: Once | INTRAMUSCULAR | Status: AC
  Administered 2023-04-10: 25 mg via INTRAVENOUS
  Filled 2023-04-10: qty 1

## 2023-04-10 MED ORDER — SODIUM CHLORIDE 0.9 % IV SOLN: Freq: Once | INTRAVENOUS | Status: AC

## 2023-04-10 MED ORDER — DEXAMETHASONE SODIUM PHOSPHATE 10 MG/ML IJ SOLN
10.0000 mg | Freq: Once | INTRAMUSCULAR | Status: AC
  Administered 2023-04-10: 10 mg via INTRAVENOUS
  Filled 2023-04-10: qty 1

## 2023-04-10 MED ORDER — OXYCODONE HCL 5 MG PO TABS
5.0000 mg | ORAL_TABLET | Freq: Once | ORAL | Status: AC
  Administered 2023-04-10: 5 mg via ORAL
  Filled 2023-04-10: qty 1

## 2023-04-10 MED ORDER — SODIUM CHLORIDE 0.9% FLUSH
10.0000 mL | INTRAVENOUS | Status: DC | PRN
  Administered 2023-04-10: 10 mL

## 2023-04-10 MED ORDER — SODIUM CHLORIDE 0.9 % IV SOLN
50.0000 mg/m2 | Freq: Once | INTRAVENOUS | Status: AC
Start: 2023-04-10 — End: 2023-04-10
  Administered 2023-04-10: 108 mg via INTRAVENOUS
  Filled 2023-04-10: qty 18

## 2023-04-10 MED ORDER — FAMOTIDINE IN NACL 20-0.9 MG/50ML-% IV SOLN
20.0000 mg | Freq: Once | INTRAVENOUS | Status: AC
Start: 2023-04-10 — End: 2023-04-10
  Administered 2023-04-10: 20 mg via INTRAVENOUS
  Filled 2023-04-10: qty 50

## 2023-04-10 MED ORDER — ACETAMINOPHEN 325 MG PO TABS
325.0000 mg | ORAL_TABLET | Freq: Once | ORAL | Status: AC
Start: 2023-04-10 — End: 2023-04-10
  Administered 2023-04-10: 325 mg via ORAL
  Filled 2023-04-10: qty 1

## 2023-04-10 MED ORDER — PALONOSETRON HCL INJECTION 0.25 MG/5ML
0.2500 mg | Freq: Once | INTRAVENOUS | Status: AC
Start: 2023-04-10 — End: 2023-04-10
  Administered 2023-04-10: 0.25 mg via INTRAVENOUS
  Filled 2023-04-10: qty 5

## 2023-04-10 NOTE — Assessment & Plan Note (Addendum)
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, DCIS posterior margin positive, lymphovascular invasion not identified, ER 0%, PR 0%, Ki-67 85%, HER2 0 by IHC Margin excision: Posterior margin: Scattered foci of high-grade DCIS, 0/2 lymph nodes negative, final margins negative  (10/02/2022: Initial biopsy was DCIS ER 30% PR 2%)   Treatment plan: Adjuvant chemotherapy with dose dense Adriamycin and Cytoxan followed by Taxol   Adjuvant radiation therapy ------------------------------------------------- Current treatment: Completed 4 cycles of dose dense Adriamycin and Cytoxan, today cycle 7 Taxol Chemo toxicities: Fatigue-managed with energy conservation Hospitalization 01/21/2023 for nasal burning, chest discomfort leukocytosis.    Chemo-induced mucositis  Nausea: Aloxi prior to each treatment Hospitalization: 02/17/2023-02/21/2023: COVID 19 infection with pneumonia 6.  Hypokalemia: Monitoring closely 7. GERD: on Pepcid   Patient wants to be off next week because of her grandsons birthday.  Return to clinic weekly for Taxol and every other week for follow-up with me

## 2023-04-10 NOTE — Progress Notes (Signed)
Patient Care Team: Hoy Register, MD as PCP - General (Family Medicine) Corky Crafts, MD as PCP - Cardiology (Cardiology) Glendale Chard, DO as Consulting Physician (Neurology) Abigail Miyamoto, MD as Consulting Physician (General Surgery) Serena Croissant, MD as Consulting Physician (Hematology and Oncology) Lonie Peak, MD as Attending Physician (Radiation Oncology) Pershing Proud, RN as Oncology Nurse Navigator Donnelly Angelica, RN as Oncology Nurse Navigator  DIAGNOSIS:  Encounter Diagnosis  Name Primary?   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, ER 30% weak, PR 2%   10/11/2022 Cancer Staging   Staging form: Breast, AJCC 8th Edition - Clinical: Stage 0 (cTis (DCIS), cN0, cM0, G3, ER+, PR+, HER2: Not Assessed) - Signed by Serena Croissant, MD on 10/11/2022 Stage prefix: Initial diagnosis Histologic grading system: 3 grade system   10/11/2022 Genetic Testing   Declined hereditary cancer genetic testing    11/13/2022 Surgery   Right lumpectomy: Grade 3 IDC, 2 cm in size inferior and posterior margins are positive, DCIS posterior margin positive, lymphovascular invasion not identified, ER 0%, PR 0%, Ki-67 85%, HER2 0 by [facility]   12/01/2022 Surgery   Margin excision: Posterior margin: Scattered foci of high-grade DCIS, 0/2 lymph nodes negative   01/01/2023 -  Chemotherapy   Patient is on Treatment Plan : BREAST ADJUVANT DOSE DENSE AC q14d / PACLitaxel q7d       CHIEF COMPLIANT: Cycle 7 Taxol   History of Present Illness   The patient, currently undergoing chemotherapy with  Taxol for breast cancer, presents with neuropathy in the tips of her fingers and toes, predominantly on the left side. The discomfort is not severe enough to affect her mobility or fine motor skills, such as buttoning a shirt or writing. The patient reports that the neuropathy is most bothersome at night, causing her to wake up and pace the floor to alleviate the numbness. Despite taking 800mg  of gabapentin at night, the patient reports minimal relief.  The patient also mentions a thyroid nodule that has been recommended for biopsy. She expresses reluctance to undergo the procedure due to discomfort and stress, particularly given her ongoing cancer treatment.       ALLERGIES:  is allergic to latex and tramadol.  MEDICATIONS:  Current Outpatient Medications  Medication Sig Dispense Refill   acetaminophen (TYLENOL) 500 MG tablet Take 1,000 mg by mouth every 6 (six) hours as needed for moderate pain.     albuterol (VENTOLIN HFA) 108 (90 Base) MCG/ACT inhaler TAKE 2 PUFFS BY MOUTH EVERY 6 HOURS AS NEEDED FOR WHEEZE OR SHORTNESS OF BREATH (Patient taking differently: Inhale 2 puffs into the lungs every 6 (six) hours as needed for wheezing or shortness of breath.) 54 each 0   benzonatate (TESSALON) 200 MG capsule Take 1 capsule (200 mg total) by mouth 3 (three) times daily as needed for cough. 21 capsule 0   cyclobenzaprine (FLEXERIL) 10 MG tablet TAKE 1 TABLET BY MOUTH AT BEDTIME AS NEEDED FOR MUSCLE SPASMS (Patient taking differently: Take 10 mg by mouth at bedtime.) 30 tablet 1   esomeprazole (NEXIUM) 40 MG capsule Take 1 capsule (40 mg total) by mouth 2 (two) times daily  before a meal. 60 capsule 3   fluconazole (DIFLUCAN) 150 MG tablet Take 1 tablet (150 mg total) by mouth every three (3) days as needed (Yeast infection). 3 tablet 0   fluconazole (DIFLUCAN) 150 MG tablet Take 1 tablet (150 mg total) by mouth every other day as needed. 2 tablet 2   gabapentin (NEURONTIN) 400 MG capsule Take 400 mg  twice a day (morning and noon) and 800 mg at bedtime. 56 capsule 0   HYDROcodone bit-homatropine (HYCODAN) 5-1.5 MG/5ML syrup Take 5 mLs by mouth every 6 (six) hours as needed for cough. 120 mL 0   hydrocortisone (ANUSOL-HC) 2.5 % rectal cream Place rectally 4 (four) times daily as needed for hemorrhoids or anal itching. 30 g 0   hydrOXYzine (ATARAX) 25 MG tablet TAKE 1 TABLET(25 MG) BY MOUTH THREE TIMES DAILY 270 tablet 1   lidocaine (XYLOCAINE) 2 % solution Use as directed 15 mLs in the mouth or throat every 4 (four) hours as needed for mouth pain.     lidocaine-prilocaine (EMLA) cream Apply topically daily.     linaclotide (LINZESS) 145 MCG CAPS capsule Take 1 capsule (145 mcg total) by mouth daily before breakfast. 30 capsule 5   methimazole (TAPAZOLE) 5 MG tablet Take 1 tablet (5 mg total) by mouth as directed. 1 tablet Monday through Thursday,, skip Friday, Saturdays and  Sundays 52 tablet 3   metoprolol succinate (TOPROL-XL) 25 MG 24 hr tablet Take 0.5 tablets (12.5 mg total) by mouth daily. For increased heart rate 45 tablet 1   metroNIDAZOLE (FLAGYL) 500 MG tablet Take 1,000 mg by mouth 2 (two) times daily.     metroNIDAZOLE (METROGEL) 0.75 % vaginal gel Place 1 Applicatorful vaginally at bedtime. Use for 5 nights. 70 g 0   naproxen (NAPROSYN) 500 MG tablet TAKE 1 TABLET BY MOUTH TWICE A DAY AS NEEDED FOR PAIN 60 tablet 1   ondansetron (ZOFRAN) 8 MG tablet Take 1 tab (8 mg) by mouth every 8 hrs as needed for nausea/vomiting. Start third day after doxorubicin/cyclophosphamide chemotherapy. 30 tablet 1   prochlorperazine (COMPAZINE) 10 MG tablet Take 1 tablet (10 mg total) by mouth every 6 (six) hours as needed for nausea or vomiting. 30 tablet 1   zolpidem (AMBIEN) 5 MG tablet Take 1 tablet (5 mg total) by mouth at bedtime as needed for sleep. 30 tablet 1   No current facility-administered medications for this visit.    PHYSICAL EXAMINATION: ECOG PERFORMANCE STATUS: 1 - Symptomatic but  completely ambulatory  Vitals:   04/10/23 1045 04/10/23 1046  BP: (!) 150/97 (!) 128/90  Pulse: (!) 103   Resp: 18   Temp: (!) 97.3 F (36.3 C)   SpO2: 100%    Filed Weights   04/10/23 1045  Weight: 214 lb 8 oz (97.3 kg)     LABORATORY DATA:  I have reviewed the data as listed    Latest Ref Rng & Units 04/10/2023   10:25 AM 04/03/2023   11:11 AM 03/27/2023   12:37 PM  CMP  Glucose 70 - 99 mg/dL 96  409  811   BUN 6 - 20 mg/dL 10  9  12    Creatinine 0.44 - 1.00 mg/dL 9.14  7.82  9.56   Sodium 135 - 145 mmol/L 141  140  140   Potassium 3.5 - 5.1 mmol/L 3.8  3.8  3.5   Chloride 98 - 111 mmol/L 107  108  106   CO2 22 - 32 mmol/L 27  27  26   Calcium 8.9 - 10.3 mg/dL 9.5  9.3  9.6   Total Protein 6.5 - 8.1 g/dL 7.0  6.9  7.3   Total Bilirubin 0.3 - 1.2 mg/dL 0.3  0.3  0.6   Alkaline Phos 38 - 126 U/L 110  116  114   AST 15 - 41 U/L 21  21  37   ALT 0 - 44 U/L 37  35  71     Lab Results  Component Value Date   WBC 4.9 04/10/2023   HGB 11.8 (L) 04/10/2023   HCT 33.9 (L) 04/10/2023   MCV 88.3 04/10/2023   PLT 251 04/10/2023   NEUTROABS 2.6 04/10/2023    ASSESSMENT & PLAN:  Malignant neoplasm of upper-outer quadrant of right breast in female, estrogen receptor negative (HCC) 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, DCIS posterior margin positive, lymphovascular invasion not identified, ER 0%, PR 0%, Ki-67 85%, HER2 0 by IHC Margin excision: Posterior margin: Scattered foci of high-grade DCIS, 0/2 lymph nodes negative, final margins negative  (10/02/2022: Initial biopsy was DCIS ER 30% PR 2%)   Treatment plan: Adjuvant chemotherapy with dose dense Adriamycin and Cytoxan followed by Taxol   Adjuvant radiation therapy ------------------------------------------------- Current treatment: Completed 4 cycles of dose dense Adriamycin and Cytoxan, today cycle 7 Taxol Chemo toxicities: Fatigue-managed with energy  conservation Hospitalization 01/21/2023 for nasal burning, chest discomfort leukocytosis.    Chemo-induced mucositis  Nausea: Aloxi prior to each treatment Hospitalization: 02/17/2023-02/21/2023: COVID 19 infection with pneumonia 6.  Hypokalemia: Monitoring closely 7. GERD: on Pepcid   Patient wants to be off next week because of her grandsons birthday.    Chemotherapy-induced Peripheral Neuropathy Noted numbness in the tips of fingers and toes, predominantly on the left side. No functional impairment reported. Gabapentin 800mg  at night provides minimal relief. Pain is worse at night and sometimes causes patient to wake up and pace. Discussed the possibility of discontinuing chemotherapy if neuropathy worsens. -Continue Gabapentin 800mg  at night. -Monitor closely for worsening neuropathy.  Thyroid Nodule Patient has been recommended to undergo a biopsy but is hesitant due to discomfort and anxiety. Discussed the importance of biopsy in ruling out malignancy. -Encourage patient to consider biopsy for definitive diagnosis.  Port Access Difficulties Patient reports discomfort and difficulty with port access by certain staff members. Prefers specific individuals who are able to access port with minimal discomfort. -Recommend consistent port access by preferred staff members when possible.  Chemotherapy Patient is currently on cycle 7. Discussed the possibility of stopping at cycle 10 due to neuropathy. Patient will have a break from treatment next week due to personal commitments. -Skip chemotherapy next week. -Resume chemotherapy on 04/24/2023. -Consider stopping chemotherapy at cycle 10 if neuropathy worsens.  General Health Maintenance Blood work shows normal white count, near-normal hemoglobin, and good platelet count. -Continue to monitor blood work closely.        No orders of the defined types were placed in this encounter.  The patient has a good understanding of the overall  plan. she agrees with it. she will call with any problems that may develop before the next visit here. Total time spent: 30 mins including face to face time and time spent for planning, charting and co-ordination of care   Tamsen Meek, MD 04/10/23

## 2023-04-11 ENCOUNTER — Encounter: Payer: Self-pay | Admitting: Hematology and Oncology

## 2023-04-17 ENCOUNTER — Ambulatory Visit: Payer: 59

## 2023-04-17 ENCOUNTER — Ambulatory Visit: Payer: 59 | Admitting: Adult Health

## 2023-04-17 ENCOUNTER — Other Ambulatory Visit: Payer: 59

## 2023-04-23 ENCOUNTER — Encounter: Payer: Self-pay | Admitting: Hematology and Oncology

## 2023-04-23 ENCOUNTER — Encounter: Payer: Self-pay | Admitting: *Deleted

## 2023-04-24 ENCOUNTER — Encounter: Payer: Self-pay | Admitting: *Deleted

## 2023-04-24 ENCOUNTER — Encounter: Payer: Self-pay | Admitting: Adult Health

## 2023-04-24 ENCOUNTER — Inpatient Hospital Stay: Payer: Medicaid Other | Attending: Hematology and Oncology

## 2023-04-24 ENCOUNTER — Telehealth: Payer: Self-pay | Admitting: Radiation Oncology

## 2023-04-24 ENCOUNTER — Inpatient Hospital Stay: Payer: Medicaid Other

## 2023-04-24 ENCOUNTER — Inpatient Hospital Stay (HOSPITAL_BASED_OUTPATIENT_CLINIC_OR_DEPARTMENT_OTHER): Payer: Medicaid Other | Admitting: Adult Health

## 2023-04-24 VITALS — BP 145/89 | HR 95 | Temp 98.1°F | Resp 18 | Wt 216.3 lb

## 2023-04-24 DIAGNOSIS — R197 Diarrhea, unspecified: Secondary | ICD-10-CM | POA: Insufficient documentation

## 2023-04-24 DIAGNOSIS — Z171 Estrogen receptor negative status [ER-]: Secondary | ICD-10-CM | POA: Diagnosis not present

## 2023-04-24 DIAGNOSIS — E041 Nontoxic single thyroid nodule: Secondary | ICD-10-CM | POA: Insufficient documentation

## 2023-04-24 DIAGNOSIS — K5900 Constipation, unspecified: Secondary | ICD-10-CM | POA: Insufficient documentation

## 2023-04-24 DIAGNOSIS — Z803 Family history of malignant neoplasm of breast: Secondary | ICD-10-CM | POA: Insufficient documentation

## 2023-04-24 DIAGNOSIS — T451X5A Adverse effect of antineoplastic and immunosuppressive drugs, initial encounter: Secondary | ICD-10-CM | POA: Diagnosis not present

## 2023-04-24 DIAGNOSIS — G620 Drug-induced polyneuropathy: Secondary | ICD-10-CM | POA: Insufficient documentation

## 2023-04-24 DIAGNOSIS — C50411 Malignant neoplasm of upper-outer quadrant of right female breast: Secondary | ICD-10-CM | POA: Diagnosis not present

## 2023-04-24 DIAGNOSIS — E559 Vitamin D deficiency, unspecified: Secondary | ICD-10-CM | POA: Insufficient documentation

## 2023-04-24 DIAGNOSIS — Z800 Family history of malignant neoplasm of digestive organs: Secondary | ICD-10-CM | POA: Diagnosis not present

## 2023-04-24 DIAGNOSIS — Z95828 Presence of other vascular implants and grafts: Secondary | ICD-10-CM

## 2023-04-24 LAB — CBC WITH DIFFERENTIAL (CANCER CENTER ONLY)
Abs Immature Granulocytes: 0.01 10*3/uL (ref 0.00–0.07)
Basophils Absolute: 0 10*3/uL (ref 0.0–0.1)
Basophils Relative: 0 %
Eosinophils Absolute: 0.1 10*3/uL (ref 0.0–0.5)
Eosinophils Relative: 2 %
HCT: 34.6 % — ABNORMAL LOW (ref 36.0–46.0)
Hemoglobin: 11.8 g/dL — ABNORMAL LOW (ref 12.0–15.0)
Immature Granulocytes: 0 %
Lymphocytes Relative: 36 %
Lymphs Abs: 1.8 10*3/uL (ref 0.7–4.0)
MCH: 29.8 pg (ref 26.0–34.0)
MCHC: 34.1 g/dL (ref 30.0–36.0)
MCV: 87.4 fL (ref 80.0–100.0)
Monocytes Absolute: 0.5 10*3/uL (ref 0.1–1.0)
Monocytes Relative: 10 %
Neutro Abs: 2.5 10*3/uL (ref 1.7–7.7)
Neutrophils Relative %: 52 %
Platelet Count: 260 10*3/uL (ref 150–400)
RBC: 3.96 MIL/uL (ref 3.87–5.11)
RDW: 13.4 % (ref 11.5–15.5)
WBC Count: 5 10*3/uL (ref 4.0–10.5)
nRBC: 0 % (ref 0.0–0.2)

## 2023-04-24 LAB — CMP (CANCER CENTER ONLY)
ALT: 67 U/L — ABNORMAL HIGH (ref 0–44)
AST: 37 U/L (ref 15–41)
Albumin: 3.9 g/dL (ref 3.5–5.0)
Alkaline Phosphatase: 124 U/L (ref 38–126)
Anion gap: 5 (ref 5–15)
BUN: 10 mg/dL (ref 6–20)
CO2: 28 mmol/L (ref 22–32)
Calcium: 9.2 mg/dL (ref 8.9–10.3)
Chloride: 108 mmol/L (ref 98–111)
Creatinine: 0.76 mg/dL (ref 0.44–1.00)
GFR, Estimated: >60 mL/min (ref >60)
Glucose, Bld: 110 mg/dL — ABNORMAL HIGH (ref 70–99)
Potassium: 3.3 mmol/L — ABNORMAL LOW (ref 3.5–5.1)
Sodium: 141 mmol/L (ref 135–145)
Total Bilirubin: 0.4 mg/dL (ref <1.2)
Total Protein: 6.6 g/dL (ref 6.5–8.1)

## 2023-04-24 MED ORDER — SODIUM CHLORIDE 0.9% FLUSH
10.0000 mL | Freq: Once | INTRAVENOUS | Status: AC
  Administered 2023-04-24: 10 mL

## 2023-04-24 NOTE — Progress Notes (Signed)
[facility] Cancer Follow up:    Laurie Register, MD [address] [place] [place] [address]   DIAGNOSIS:  Cancer Staging  Malignant neoplasm of upper-outer quadrant of right breast in female, estrogen receptor negative (HCC) Staging form: Breast, AJCC 8th Edition - Clinical: Stage 0 (cTis (DCIS), cN0, cM0, G3, ER+, PR+, HER2: Not Assessed) - Signed by Serena Croissant, MD on 10/11/2022 Stage prefix: Initial diagnosis Histologic grading system: 3 grade system - Pathologic: Stage IIA (pT1c, pN1, cM0, G3, ER-, PR-, HER2-) - Unsigned Histologic grading system: 3 grade system   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, ER 30% weak, PR 2%   10/11/2022 Cancer Staging   Staging form: Breast, AJCC 8th Edition - Clinical: Stage 0 (cTis (DCIS), cN0, cM0, G3, ER+, PR+, HER2: Not Assessed) - Signed by Serena Croissant, MD on 10/11/2022 Stage prefix: Initial diagnosis Histologic grading system: 3 grade system   10/11/2022 Genetic Testing   Declined hereditary cancer genetic testing    11/13/2022 Surgery   Right lumpectomy: Grade 3 IDC, 2 cm in size inferior and posterior margins are positive, DCIS posterior margin positive, lymphovascular invasion not identified, ER 0%, PR 0%, Ki-67 85%, HER2 0 by [facility]   12/01/2022 Surgery   Margin excision: Posterior margin: Scattered foci of high-grade DCIS, 0/2 lymph nodes negative   01/01/2023 -  Chemotherapy   Patient is on Treatment Plan : BREAST ADJUVANT DOSE DENSE AC q14d / PACLitaxel q7d       CURRENT THERAPY: Taxol  INTERVAL HISTORY:  Discussed the use of AI scribe software for clinical note transcription with the patient, who gave verbal  consent to proceed.  Laurie Bell 42 y.o. female returns for f/u prior to receiving chemo.  She is currently undergoing Taxol chemotherapy for triple negative breast cancer, presents for her eighth week of treatment. 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 The neuropathy is described as a numbness that extends up past the toes, with episodes of severe pain that have temporarily prevented standing. Despite a dose reduction and the use of gabapentin, the neuropathy symptoms have not significantly improved and in fact worsened slightly this past week when she did not receive treatment.  The patient also reports alternating constipation and diarrhea, with no relief from current medications. Prior to her diagnosis and treatment, the patient was active and worked in [employer]. The patient's family history is significant for a grandmother who had breast cancer. The patient also has a thyroid nodule, for which she is scheduled to undergo biopsy in December.   Patient Active Problem List   Diagnosis Date Noted   Trichomonas vaginitis 03/28/2023   Friable cervix 03/27/2023   PCB (post coital bleeding) 03/27/2023   Screening for cervical cancer 03/27/2023   Sepsis (HCC) 02/17/2023   COVID-19 virus infection 02/17/2023   Lower extremity pain, anterior, right 01/22/2023   SIRS (systemic inflammatory response syndrome) (HCC) 01/21/2023   Port-A-Cath in place 01/01/2023   Malignant neoplasm of upper-outer quadrant of right breast in female, estrogen receptor negative (HCC) 10/09/2022   Multinodular goiter 03/22/2022   Hyperthyroidism 03/22/2022   MDD (major depressive disorder), recurrent episode, moderate (HCC) 11/03/2021   GAD (  generalized anxiety disorder) 11/03/2021   Neck pain 10/28/2021   Cervical high risk HPV (human papillomavirus) test positive 10/17/2021   Irregular heart rhythm 04/15/2021   Numbness on left side  04/15/2021   Dysphagia 03/17/2021   Hypomagnesemia 02/23/2021   Gastroesophageal reflux disease    Hypokalemia 08/24/2020   Atypical chest pain 08/24/2020   Nausea and vomiting 08/24/2020   Hypocalcemia 08/24/2020   DUB (dysfunctional uterine bleeding) 01/12/2017   Fibroid, uterine 01/12/2017    is allergic to latex and tramadol.  MEDICAL HISTORY: Past Medical History:  Diagnosis Date   Anxiety    Arthritis    Breast cancer (HCC)    Chlamydia contact, treated    GERD (gastroesophageal reflux disease)    Hyperthyroidism     SURGICAL HISTORY: Past Surgical History:  Procedure Laterality Date   AXILLARY SENTINEL NODE BIOPSY Right 12/01/2022   Procedure: RIGHT AXILLARY SENTINEL NODE BIOPSY;  Surgeon: Abigail Miyamoto, MD;  Location: [facility] OR;  Service: General;  Laterality: Right;   BREAST BIOPSY Right 10/02/2022   MM RT BREAST BX W LOC DEV 1ST LESION IMAGE BX SPEC STEREO GUIDE 10/02/2022 [facility]   BREAST BIOPSY  11/10/2022   MM RT RADIOACTIVE SEED LOC MAMMO GUIDE 11/10/2022 [facility]MO[facility]   BREAST LUMPECTOMY WITH RADIOACTIVE SEED LOCALIZATION Right 11/13/2022   Procedure: RIGHT BREAST LUMPECTOMY WITH RADIOACTIVE SEED LOCALIZATION;  Surgeon: Abigail Miyamoto, MD;  Location: [facility];  Service: General;  Laterality: Right;   ENDOMETRIAL ABLATION N/A 04/25/2017   Procedure: ENDOMETRIAL ABLATION With NOVASURE;  Surgeon: Adam Phenix, MD;  Location: [facility];  Service: Gynecology;  Laterality: N/A;   ESOPHAGEAL MANOMETRY N/A 07/27/2021   Procedure: ESOPHAGEAL MANOMETRY (EM);  Surgeon: Imogene Burn, MD;  Location: [facility];  Service: Gastroenterology;  Laterality: N/A;   PORTACATH PLACEMENT N/A 12/01/2022   Procedure: INSERTION PORT-A-CATH WITH ULTRASOUND GUIDANCE;  Surgeon: Abigail Miyamoto, MD;  Location: [facility] OR;  Service: General;  Laterality: N/A;   RE-EXCISION OF BREAST CANCER,SUPERIOR MARGINS Right 12/01/2022   Procedure:  RE-EXCISION OF RIGHT BREAST CANCER;  Surgeon: Abigail Miyamoto, MD;  Location[facility] OR;  Service: General;  Laterality: Right;   TUBAL LIGATION  2003    SOCIAL HISTORY: Social History   Socioeconomic History   Marital status: Single    Spouse name: Not on file   Number of children: 2   Years of education: Not on file   Highest education level: Not on file  Occupational History   Occupation: Event organiser: [employer]  Tobacco Use   Smoking status: Never   Smokeless tobacco: Never  Vaping Use   Vaping status: Never Used  Substance and Sexual Activity   Alcohol use: No   Drug use: Not Currently    Types: Marijuana    Comment: quit 2018   Sexual activity: Not Currently    Birth control/protection: Surgical  Other Topics Concern   Not on file  Social History Narrative   ** Merged History Encounter **       Right Handed  Lives in a one story home    Social Determinants of Health   Financial Resource Strain: Medium Risk (11/03/2021)   Overall Financial Resource Strain (CARDIA)    Difficulty of Paying Living Expenses: Somewhat hard  Food Insecurity: No Food Insecurity (02/23/2023)   Hunger Vital Sign    Worried About Running Out of Food in the Last Year: Never true    Ran Out of Food  in the Last Year: Never true  Transportation Needs: No Transportation Needs (02/23/2023)   PRAPARE - Administrator, Civil Service (Medical): No    Lack of Transportation (Non-Medical): No  Physical Activity: Sufficiently Active (11/03/2021)   Exercise Vital Sign    Days of Exercise per Week: 3 days    Minutes of Exercise per Session: 50 min  Stress: Stress Concern Present (11/03/2021)   [employer] of Occupational Health - Occupational Stress Questionnaire    Feeling of Stress : Very much  Social Connections: Moderately Isolated (11/03/2021)   Social Connection and Isolation Panel [NHANES]    Frequency of Communication with Friends and Family: Once a week    Frequency  of Social Gatherings with Friends and Family: Once a week    Attends Religious Services: More than 4 times per year    Active Member of [employer] or Organizations: No    Attends Banker Meetings: Never    Marital Status: Living with partner  Intimate Partner Violence: Not At Risk (02/17/2023)   Humiliation, Afraid, Rape, and Kick questionnaire    Fear of Current or Ex-Partner: No    Emotionally Abused: No    Physically Abused: No    Sexually Abused: No    FAMILY HISTORY: Family History  Problem Relation Age of Onset   Hypertension Mother    Hypertension Maternal Aunt    Breast cancer Maternal Grandmother        dx > 50   Stomach cancer Maternal Grandfather        dx > 50   Breast cancer Paternal Grandmother        dx > 50   Esophageal cancer Neg Hx    Rectal cancer Neg Hx    Colon cancer Neg Hx     Review of Systems  Constitutional:  Negative for appetite change, chills, fatigue, fever and unexpected weight change.  HENT:   Negative for hearing loss, lump/mass and trouble swallowing.   Eyes:  Negative for eye problems and icterus.  Respiratory:  Negative for chest tightness, cough and shortness of breath.   Cardiovascular:  Negative for chest pain, leg swelling and palpitations.  Gastrointestinal:  Negative for abdominal distention, abdominal pain, constipation, diarrhea, nausea and vomiting.  Endocrine: Negative for hot flashes.  Genitourinary:  Negative for difficulty urinating.   Musculoskeletal:  Positive for gait problem. Negative for arthralgias.  Skin:  Negative for itching and rash.  Neurological:  Positive for gait problem and numbness. Negative for dizziness, extremity weakness and headaches.  Hematological:  Negative for adenopathy. Does not bruise/bleed easily.  Psychiatric/Behavioral:  Negative for depression. The patient is not nervous/anxious.       PHYSICAL EXAMINATION    Vitals:   04/24/23 1041  BP: (!) 145/89  Pulse: 95  Resp: 18  Temp:  98.1 F (36.7 C)  SpO2: 100%    Physical Exam Constitutional:      General: She is not in acute distress.    Appearance: Normal appearance. She is not toxic-appearing.  HENT:     Head: Normocephalic and atraumatic.     Mouth/Throat:     Mouth: Mucous membranes are moist.     Pharynx: Oropharynx is clear. No oropharyngeal exudate or posterior oropharyngeal erythema.  Eyes:     General: No scleral icterus. Cardiovascular:     Rate and Rhythm: Normal rate and regular rhythm.     Pulses: Normal pulses.     Heart sounds: Normal heart sounds.  Pulmonary:     Effort: Pulmonary effort is normal.     Breath sounds: Normal breath sounds.  Abdominal:     General: Abdomen is flat. Bowel sounds are normal. There is no distension.     Palpations: Abdomen is soft.     Tenderness: There is no abdominal tenderness.  Musculoskeletal:        General: No swelling.     Cervical back: Neck supple.  Lymphadenopathy:     Cervical: No cervical adenopathy.  Skin:    General: Skin is warm and dry.     Findings: No rash.  Neurological:     General: No focal deficit present.     Mental Status: She is alert.  Psychiatric:        Mood and Affect: Mood normal.        Behavior: Behavior normal.     LABORATORY DATA:  CBC    Component Value Date/Time   WBC 5.0 04/24/2023 1021   WBC 7.6 02/21/2023 0823   RBC 3.96 04/24/2023 1021   HGB 11.8 (L) 04/24/2023 1021   HCT 34.6 (L) 04/24/2023 1021   PLT 260 04/24/2023 1021   MCV 87.4 04/24/2023 1021   MCH 29.8 04/24/2023 1021   MCHC 34.1 04/24/2023 1021   RDW 13.4 04/24/2023 1021   LYMPHSABS 1.8 04/24/2023 1021   MONOABS 0.5 04/24/2023 1021   EOSABS 0.1 04/24/2023 1021   BASOSABS 0.0 04/24/2023 1021    CMP     Component Value Date/Time   NA 141 04/24/2023 1021   NA 138 01/18/2022 1512   K 3.3 (L) 04/24/2023 1021   CL 108 04/24/2023 1021   CO2 28 04/24/2023 1021   GLUCOSE 110 (H) 04/24/2023 1021   BUN 10 04/24/2023 1021   BUN 9  01/18/2022 1512   CREATININE 0.76 04/24/2023 1021   CALCIUM 9.2 04/24/2023 1021   PROT 6.6 04/24/2023 1021   PROT 6.9 04/15/2021 0942   ALBUMIN 3.9 04/24/2023 1021   ALBUMIN 4.3 04/15/2021 0942   AST 37 04/24/2023 1021   ALT 67 (H) 04/24/2023 1021   ALKPHOS 124 04/24/2023 1021   BILITOT 0.4 04/24/2023 1021   GFRNONAA >60 04/24/2023 1021   GFRAA >60 02/01/2020 2233        ASSESSMENT and THERAPY PLAN:   Malignant neoplasm of upper-outer quadrant of right breast in female, estrogen receptor negative (HCC) 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, DCIS posterior margin positive, lymphovascular invasion not identified, ER 0%, PR 0%, Ki-67 85%, HER2 0 by IHC Margin excision: Posterior margin: Scattered foci of high-grade DCIS, 0/2 lymph nodes negative, final margins negative  (10/02/2022: Initial biopsy was DCIS ER 30% PR 2%)   Treatment plan: Adjuvant chemotherapy with dose dense Adriamycin and Cytoxan followed by Taxol   Adjuvant radiation therapy ------------------------------------------------- Current treatment: Completed 4 cycles of dose dense Adriamycin and Cytoxan, today cycle 8 Taxol  Triple Negative Breast Cancer Undergoing Taxol chemotherapy, currently on week 8. Noted worsening peripheral neuropathy despite dose reduction and gabapentin use. Neuropathy is causing significant pain and functional impairment, including balance issues and difficulty standing. -Consult with oncologist, Dr. Pamelia Hoit to discuss risk/benefit of continuing chemotherapy given worsening neuropathy (see his addendum) -Consider discontinuing chemo due to neuropathy.  Thyroid Nodule Patient has not yet undergone biopsy. -scheduled for 05/2023, encourged her to follow through as scheduled.  Constipation/Diarrhea Patient reports alternating constipation and diarrhea. -Continue monitoring bowel habits. Consider dietary modifications or medication  adjustments if symptoms persist.  Port Access No current issues reported. -Continue to monitor for any complications related to port access.  Next steps Patient talked with Dr. Pamelia Hoit and will stop chemotherapy today. -referral to radiation oncology for adjuvant radiation.    All questions were answered. The patient knows to call the clinic with any problems, questions or concerns. We can certainly see the patient much sooner if necessary.  Lillard Anes, NP 04/24/23 11:28 AM Medical Oncology and Hematology [facility] [address] [place], [place] [address] Tel. [phone]    Fax. [phone]  *Total Encounter Time as defined by the Centers for Medicare and Medicaid Services includes, in addition to the face-to-face time of a patient visit (documented in the note above) non-face-to-face time: obtaining and reviewing outside history, ordering and reviewing medications, tests or procedures, care coordination (communications with other health care professionals or caregivers) and documentation in the medical record.

## 2023-04-24 NOTE — Telephone Encounter (Signed)
11/12 @ 12:39 pm Left voicemail for patient to call our office to be reschedule for consult.

## 2023-04-24 NOTE — Assessment & Plan Note (Signed)
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, DCIS posterior margin positive, lymphovascular invasion not identified, ER 0%, PR 0%, Ki-67 85%, HER2 0 by IHC Margin excision: Posterior margin: Scattered foci of high-grade DCIS, 0/2 lymph nodes negative, final margins negative  (10/02/2022: Initial biopsy was DCIS ER 30% PR 2%)   Treatment plan: Adjuvant chemotherapy with dose dense Adriamycin and Cytoxan followed by Taxol   Adjuvant radiation therapy ------------------------------------------------- Current treatment: Completed 4 cycles of dose dense Adriamycin and Cytoxan, today cycle 8 Taxol  Triple Negative Breast Cancer Undergoing Taxol chemotherapy, currently on week 8. Noted worsening peripheral neuropathy despite dose reduction and gabapentin use. Neuropathy is causing significant pain and functional impairment, including balance issues and difficulty standing. -Consult with oncologist, Dr. Pamelia Hoit to discuss risk/benefit of continuing chemotherapy given worsening neuropathy (see his addendum) -Consider discontinuing chemo due to neuropathy.  Thyroid Nodule Patient has not yet undergone biopsy. -scheduled for 05/2023, encourged her to follow through as scheduled.  Constipation/Diarrhea Patient reports alternating constipation and diarrhea. -Continue monitoring bowel habits. Consider dietary modifications or medication adjustments if symptoms persist.  Port Access No current issues reported. -Continue to monitor for any complications related to port access.  Next steps Patient talked with Dr. Pamelia Hoit and will stop chemotherapy today. -referral to radiation oncology for adjuvant radiation.

## 2023-04-25 ENCOUNTER — Encounter: Payer: Self-pay | Admitting: Hematology and Oncology

## 2023-04-25 ENCOUNTER — Encounter (HOSPITAL_BASED_OUTPATIENT_CLINIC_OR_DEPARTMENT_OTHER): Payer: Self-pay | Admitting: Obstetrics & Gynecology

## 2023-04-26 ENCOUNTER — Ambulatory Visit (HOSPITAL_BASED_OUTPATIENT_CLINIC_OR_DEPARTMENT_OTHER): Payer: Medicaid Other

## 2023-04-26 ENCOUNTER — Encounter: Payer: Self-pay | Admitting: Hematology and Oncology

## 2023-04-30 ENCOUNTER — Other Ambulatory Visit (HOSPITAL_COMMUNITY)
Admission: RE | Admit: 2023-04-30 | Discharge: 2023-04-30 | Disposition: A | Discharge status: Home / Self Care | Payer: Medicaid Other | Source: Ambulatory Visit | Attending: Obstetrics & Gynecology | Admitting: Obstetrics & Gynecology

## 2023-04-30 ENCOUNTER — Ambulatory Visit (HOSPITAL_BASED_OUTPATIENT_CLINIC_OR_DEPARTMENT_OTHER): Payer: Medicaid Other

## 2023-04-30 ENCOUNTER — Encounter: Payer: Self-pay | Admitting: *Deleted

## 2023-04-30 VITALS — BP 112/63 | HR 76 | Ht 66.0 in | Wt 216.4 lb

## 2023-04-30 DIAGNOSIS — N898 Other specified noninflammatory disorders of vagina: Secondary | ICD-10-CM | POA: Insufficient documentation

## 2023-04-30 NOTE — Progress Notes (Signed)
Pt presented to the office with two symptoms vaginal odor and a little bit of discharge. Pt instructed and educated on self swab. Self swab was obtained. Pt notified once labs come back we will be in contact with her.

## 2023-05-01 ENCOUNTER — Other Ambulatory Visit (HOSPITAL_BASED_OUTPATIENT_CLINIC_OR_DEPARTMENT_OTHER): Payer: Self-pay

## 2023-05-01 ENCOUNTER — Encounter: Payer: Self-pay | Admitting: Hematology and Oncology

## 2023-05-01 LAB — CERVICOVAGINAL ANCILLARY ONLY
Bacterial Vaginitis (gardnerella): POSITIVE — AB
Candida Glabrata: POSITIVE — AB
Candida Vaginitis: NEGATIVE
Comment: NEGATIVE
Comment: NEGATIVE
Comment: NEGATIVE

## 2023-05-01 MED ORDER — METRONIDAZOLE 500 MG PO TABS: 500.0000 mg | ORAL_TABLET | Freq: Two times a day (BID) | ORAL | 0 refills | Status: DC

## 2023-05-01 NOTE — Assessment & Plan Note (Deleted)
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, DCIS posterior margin positive, lymphovascular invasion not identified, ER 0%, PR 0%, Ki-67 85%, HER2 0 by IHC Margin excision: Posterior margin: Scattered foci of high-grade DCIS, 0/2 lymph nodes negative, final margins negative  (10/02/2022: Initial biopsy was DCIS ER 30% PR 2%)   Treatment plan: Adjuvant chemotherapy with dose dense Adriamycin and Cytoxan followed by Taxol x 8 cycles completed 04/10/2023 (stopped early for neuropathy) Adjuvant radiation therapy -------------------------------------------------

## 2023-05-02 ENCOUNTER — Ambulatory Visit: Payer: 59 | Admitting: Hematology and Oncology

## 2023-05-02 ENCOUNTER — Encounter: Payer: Self-pay | Admitting: Hematology and Oncology

## 2023-05-02 ENCOUNTER — Other Ambulatory Visit: Payer: 59

## 2023-05-02 ENCOUNTER — Ambulatory Visit: Payer: 59

## 2023-05-03 NOTE — Telephone Encounter (Signed)
Hey there,   I think she just completed chemotherapy.  If that is the case, then I would recommend she wait a few more weeks before considering whether to go to the nail salon.    Thanks,  Mardella Layman

## 2023-05-07 ENCOUNTER — Ambulatory Visit: Payer: Self-pay | Admitting: Licensed Clinical Social Worker

## 2023-05-07 NOTE — Patient Outreach (Signed)
  Care Coordination   Follow Up Visit Note   05/07/2023 Name: Laurie Bell MRN: [id number] DOB: [birth year]  Laurie Bell is a 42 y.o. year old female who sees Hoy Register, MD for primary care. I spoke with  Laurie Bell by phone today.  What matters to the patients health and wellness today?  Symptom Management   Goals Addressed             This Visit's Progress    Obtain Supportive Resources-Finances   On track    Activities and task to complete in order to accomplish goals.   Keep all upcoming appointments discussed today Continue with compliance of taking medication prescribed by Doctor Implement healthy coping skills discussed to assist with management of symptoms         SDOH assessments and interventions completed:  No     Care Coordination Interventions:  Yes, provided  Interventions Today    Flowsheet Row Most Recent Value  Chronic Disease   Chronic disease during today's visit Other  [MDD and GAD]  General Interventions   General Interventions Discussed/Reviewed General Interventions Reviewed, Doctor Visits  [Patient recently completed treatment of chemotherapy. Endorses ongoing difficulty managing pain from tx. Has upcoming appt with provider 11/26]  Mental Health Interventions   Mental Health Discussed/Reviewed Mental Health Reviewed  [Patient reports she is doing well, no concerns with MH symptoms. Celebrated completion of chemotherapy with family]       Follow up plan: Follow up call scheduled for 2-3 weeks    Encounter Outcome:  Patient Visit Completed   Jenel Lucks, MSW, LCSW [facility] Care Management [facility]  [facility] [email or web address] Phone [phone] 12:28 PM

## 2023-05-07 NOTE — Patient Instructions (Signed)
Visit Information  Thank you for taking time to visit with me today. Please don't hesitate to contact me if I can be of assistance to you.   Following are the goals we discussed today:   Goals Addressed             This Visit's Progress    Obtain Supportive Resources-Finances   On track    Activities and task to complete in order to accomplish goals.   Keep all upcoming appointments discussed today Continue with compliance of taking medication prescribed by Doctor Implement healthy coping skills discussed to assist with management of symptoms         Our next appointment is by telephone on 12/09 at 11 AM  Please call the care guide team at [phone] if you need to cancel or reschedule your appointment.   If you are experiencing a Mental Health or Behavioral Health Crisis or need someone to talk to, please call the Suicide and Crisis Lifeline: 988 call 911   Patient verbalizes understanding of instructions and care plan provided today and agrees to view in MyChart. Active MyChart status and patient understanding of how to access instructions and care plan via MyChart confirmed with patient.     Jenel Lucks, MSW, LCSW [facility] Care Management [place]  [facility] [email or web address] Phone [phone] 12:29 PM

## 2023-05-08 ENCOUNTER — Inpatient Hospital Stay: Payer: Medicaid Other

## 2023-05-08 ENCOUNTER — Inpatient Hospital Stay (HOSPITAL_BASED_OUTPATIENT_CLINIC_OR_DEPARTMENT_OTHER): Payer: Medicaid Other | Admitting: Adult Health

## 2023-05-08 ENCOUNTER — Encounter: Payer: Self-pay | Admitting: Adult Health

## 2023-05-08 VITALS — BP 119/66 | HR 97 | Temp 97.6°F | Resp 17 | Wt 214.4 lb

## 2023-05-08 DIAGNOSIS — C50411 Malignant neoplasm of upper-outer quadrant of right female breast: Secondary | ICD-10-CM | POA: Diagnosis not present

## 2023-05-08 DIAGNOSIS — Z171 Estrogen receptor negative status [ER-]: Secondary | ICD-10-CM

## 2023-05-08 DIAGNOSIS — T451X5A Adverse effect of antineoplastic and immunosuppressive drugs, initial encounter: Secondary | ICD-10-CM

## 2023-05-08 DIAGNOSIS — E559 Vitamin D deficiency, unspecified: Secondary | ICD-10-CM

## 2023-05-08 DIAGNOSIS — G620 Drug-induced polyneuropathy: Secondary | ICD-10-CM

## 2023-05-08 LAB — CBC WITH DIFFERENTIAL (CANCER CENTER ONLY)
Abs Immature Granulocytes: 0.02 10*3/uL (ref 0.00–0.07)
Basophils Absolute: 0.1 10*3/uL (ref 0.0–0.1)
Basophils Relative: 1 %
Eosinophils Absolute: 0.2 10*3/uL (ref 0.0–0.5)
Eosinophils Relative: 3 %
HCT: 37.4 % (ref 36.0–46.0)
Hemoglobin: 12.6 g/dL (ref 12.0–15.0)
Immature Granulocytes: 0 %
Lymphocytes Relative: 32 %
Lymphs Abs: 1.8 10*3/uL (ref 0.7–4.0)
MCH: 29.2 pg (ref 26.0–34.0)
MCHC: 33.7 g/dL (ref 30.0–36.0)
MCV: 86.8 fL (ref 80.0–100.0)
Monocytes Absolute: 0.5 10*3/uL (ref 0.1–1.0)
Monocytes Relative: 10 %
Neutro Abs: 3 10*3/uL (ref 1.7–7.7)
Neutrophils Relative %: 54 %
Platelet Count: 238 10*3/uL (ref 150–400)
RBC: 4.31 MIL/uL (ref 3.87–5.11)
RDW: 12.8 % (ref 11.5–15.5)
WBC Count: 5.6 10*3/uL (ref 4.0–10.5)
nRBC: 0 % (ref 0.0–0.2)

## 2023-05-08 LAB — CMP (CANCER CENTER ONLY)
ALT: 62 U/L — ABNORMAL HIGH (ref 0–44)
AST: 39 U/L (ref 15–41)
Albumin: 4.1 g/dL (ref 3.5–5.0)
Alkaline Phosphatase: 143 U/L — ABNORMAL HIGH (ref 38–126)
Anion gap: 5 (ref 5–15)
BUN: 9 mg/dL (ref 6–20)
CO2: 29 mmol/L (ref 22–32)
Calcium: 9.6 mg/dL (ref 8.9–10.3)
Chloride: 106 mmol/L (ref 98–111)
Creatinine: 0.74 mg/dL (ref 0.44–1.00)
GFR, Estimated: >60 mL/min (ref >60)
Glucose, Bld: 115 mg/dL — ABNORMAL HIGH (ref 70–99)
Potassium: 3.3 mmol/L — ABNORMAL LOW (ref 3.5–5.1)
Sodium: 140 mmol/L (ref 135–145)
Total Bilirubin: 0.5 mg/dL (ref <1.2)
Total Protein: 7.3 g/dL (ref 6.5–8.1)

## 2023-05-08 LAB — VITAMIN B12: Vitamin B-12: 546 pg/mL (ref 180–914)

## 2023-05-08 LAB — HEMOGLOBIN A1C
Hgb A1c MFr Bld: 5.2 % (ref 4.8–5.6)
Mean Plasma Glucose: 102.54 mg/dL

## 2023-05-08 LAB — VITAMIN D 25 HYDROXY (VIT D DEFICIENCY, FRACTURES): Vit D, 25-Hydroxy: 26.39 ng/mL — ABNORMAL LOW (ref 30–100)

## 2023-05-08 MED ORDER — OXYCODONE HCL 5 MG PO TABS: 5.0000 mg | ORAL_TABLET | Freq: Four times a day (QID) | ORAL | 0 refills | Status: DC | PRN

## 2023-05-08 NOTE — Assessment & Plan Note (Addendum)
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, DCIS posterior margin positive, lymphovascular invasion not identified, ER 0%, PR 0%, Ki-67 85%, HER2 0 by IHC Margin excision: Posterior margin: Scattered foci of high-grade DCIS, 0/2 lymph nodes negative, final margins negative  (10/02/2022: Initial biopsy was DCIS ER 30% PR 2%)   Treatment plan: Adjuvant chemotherapy with dose dense Adriamycin and Cytoxan followed by Taxol   Adjuvant radiation therapy ------------------------------------------------- Current treatment: to begin adjuvant radiation.   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    Chemotherapy induced peripheral neuropathy Patient reports numbness, tingling, and burning pain in the legs, more pronounced on the left side, extending from the hip to the feet. Pain is also reported in the left groin. Patient is currently on gabapentin and naproxen for pain management. -Order labs to check blood sugars, kidney function, liver function, and vitamin levels to rule out other potential causes or contributing factors to neuropathy. -Prescribe oxycodone to be used initially for night time pain management, but can be used Q6H PRN monitor for side effects such as constipation and excessive tiredness. -PMP aware reviewed, no red flags -Reviewed goals of pain management.  -Schedule a follow-up in two weeks to assess pain management effectiveness. -Consider referral to neurology and physical therapy if pain continues to worsen or if it doesn't improve.   Recurrent Yeast Infections Patient reports recurrent yeast infections during chemotherapy. -Expect improvement now that chemotherapy has concluded. -Follow-up with GYN as recommended by their office  General Health Maintenance -Encourage patient to maintain hydration,  consume a diet rich in fruits and vegetables, and avoid processed foods. -Advise patient to monitor for menopausal symptoms due to chemotherapy potentially inducing early menopause. -Encourage patient to maintain physical activity to improve circulation and overall health.

## 2023-05-08 NOTE — Progress Notes (Signed)
[facility] Cancer Follow up:    Laurie Socks, NP [address] [place] [address]   DIAGNOSIS:  Cancer Staging  Malignant neoplasm of upper-outer quadrant of right breast in female, estrogen receptor negative (HCC) Staging form: Breast, AJCC 8th Edition - Clinical: Stage 0 (cTis (DCIS), cN0, cM0, G3, ER+, PR+, HER2: Not Assessed) - Signed by Serena Croissant, MD on 10/11/2022 Stage prefix: Initial diagnosis Histologic grading system: 3 grade system - Pathologic: Stage IIA (pT1c, pN1, cM0, G3, ER-, PR-, HER2-) - Unsigned Histologic grading system: 3 grade system   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, ER 30% weak, PR 2%   10/11/2022 Cancer Staging   Staging form: Breast, AJCC 8th Edition - Clinical: Stage 0 (cTis (DCIS), cN0, cM0, G3, ER+, PR+, HER2: Not Assessed) - Signed by Serena Croissant, MD on 10/11/2022 Stage prefix: Initial diagnosis Histologic grading system: 3 grade system   10/11/2022 Genetic Testing   Declined hereditary cancer genetic testing    11/13/2022 Surgery   Right lumpectomy: Grade 3 IDC, 2 cm in size inferior and posterior margins are positive, DCIS posterior margin positive, lymphovascular invasion not identified, ER 0%, PR 0%, Ki-67 85%, HER2 0 by [facility]   12/01/2022 Surgery   Margin excision: Posterior margin: Scattered foci of high-grade DCIS, 0/2 lymph nodes negative   01/01/2023 - 04/10/2023 Chemotherapy   Patient is on Treatment Plan : BREAST ADJUVANT DOSE DENSE AC q14d / PACLitaxel q7d       CURRENT THERAPY: s/p chemo, to begin adjuvant radiation  INTERVAL HISTORY:  Discussed the use of AI scribe software for clinical note  transcription with the patient, who gave verbal consent to proceed.  Laurie Bell 42 y.o. female with a history of cancer and recent chemotherapy, presents with persistent pain in the legs and left hip. The pain is described as a combination of stiffness, numbness, and burning sensations, extending from the hip to the feet. The patient also reports a sensation of 'hurting' when standing after sitting for prolonged periods. The pain has been severe enough to affect mobility on some days.  The patient has been managing the pain with Tylenol, naproxen, and gabapentin, but reports no significant relief. She has previously received injections for back pain, which she attributes to stress-related muscle tension. The patient also reports a history of recurrent yeast infections during chemotherapy.  The patient has been experiencing symptoms of fatigue, which she describes as feeling 'tired all day.' She also reports experiencing hot flashes, which she attributes to menopause induced by chemotherapy. The patient has not menstruated since starting chemotherapy.  The patient has been managing her symptoms at home, maintaining an active lifestyle within the house, including cleaning, cooking, and washing clothes. She reports a recent increase in weight, which she attributes to changes in diet and reduced physical activity during chemotherapy.  The patient has previously been prescribed oxycodone for pain management, which she reports was effective. She has also been prescribed Hycodan cough syrup and a muscle relaxant, Flexeril, for shoulder pain. The patient has been taking gabapentin for neuropathic pain, with no reported side effects.   The patient's pain has been severe enough to affect her daily activities and mobility. Despite this, she reports a desire to increase  her physical activity and improve her overall health.   Patient Active Problem List   Diagnosis Date Noted   Trichomonas vaginitis  03/28/2023   Friable cervix 03/27/2023   PCB (post coital bleeding) 03/27/2023   Screening for cervical cancer 03/27/2023   Sepsis (HCC) 02/17/2023   COVID-19 virus infection 02/17/2023   Lower extremity pain, anterior, right 01/22/2023   SIRS (systemic inflammatory response syndrome) (HCC) 01/21/2023   Port-A-Cath in place 01/01/2023   Malignant neoplasm of upper-outer quadrant of right breast in female, estrogen receptor negative (HCC) 10/09/2022   Multinodular goiter 03/22/2022   Hyperthyroidism 03/22/2022   MDD (major depressive disorder), recurrent episode, moderate (HCC) 11/03/2021   GAD (generalized anxiety disorder) 11/03/2021   Neck pain 10/28/2021   Cervical high risk HPV (human papillomavirus) test positive 10/17/2021   Irregular heart rhythm 04/15/2021   Numbness on left side 04/15/2021   Dysphagia 03/17/2021   Hypomagnesemia 02/23/2021   Gastroesophageal reflux disease    Hypokalemia 08/24/2020   Atypical chest pain 08/24/2020   Nausea and vomiting 08/24/2020   Hypocalcemia 08/24/2020   DUB (dysfunctional uterine bleeding) 01/12/2017   Fibroid, uterine 01/12/2017    is allergic to latex and tramadol.  MEDICAL HISTORY: Past Medical History:  Diagnosis Date   Anxiety    Arthritis    Breast cancer (HCC)    Chlamydia contact, treated    GERD (gastroesophageal reflux disease)    Hyperthyroidism     SURGICAL HISTORY: Past Surgical History:  Procedure Laterality Date   AXILLARY SENTINEL NODE BIOPSY Right 12/01/2022   Procedure: RIGHT AXILLARY SENTINEL NODE BIOPSY;  Surgeon: Abigail Miyamoto, MD;  Location: [facility];  Service: General;  Laterality: Right;   BREAST BIOPSY Right 10/02/2022   MM RT BREAST BX W LOC DEV 1ST LESION IMAGE BX SPEC STEREO GUIDE 10/02/2022 [facility]   BREAST BIOPSY  11/10/2022   MM RT RADIOACTIVE SEED LOC MAMMO GUIDE 11/10/2022 [facility]MO[facility]   BREAST LUMPECTOMY WITH RADIOACTIVE SEED LOCALIZATION Right 11/13/2022   Procedure: RIGHT  BREAST LUMPECTOMY WITH RADIOACTIVE SEED LOCALIZATION;  Surgeon: Abigail Miyamoto, MD;  Location: [facility];  Service: General;  Laterality: Right;   ENDOMETRIAL ABLATION N/A 04/25/2017   Procedure: ENDOMETRIAL ABLATION With NOVASURE;  Surgeon: Adam Phenix, MD;  Location: [facility];  Service: Gynecology;  Laterality: N/A;   ESOPHAGEAL MANOMETRY N/A 07/27/2021   Procedure: ESOPHAGEAL MANOMETRY (EM);  Surgeon: Imogene Burn, MD;  Location: [facility];  Service: Gastroenterology;  Laterality: N/A;   PORTACATH PLACEMENT N/A 12/01/2022   Procedure: INSERTION PORT-A-CATH WITH ULTRASOUND GUIDANCE;  Surgeon: Abigail Miyamoto, MD;  Location: [facility];  Service: General;  Laterality: N/A;   RE-EXCISION OF BREAST CANCER,SUPERIOR MARGINS Right 12/01/2022   Procedure: RE-EXCISION OF RIGHT BREAST CANCER;  Surgeon: Abigail Miyamoto, MD;  Location: [facility];  Service: General;  Laterality: Right;   TUBAL LIGATION  2003    SOCIAL HISTORY: Social History   Socioeconomic History   Marital status: Single    Spouse name: Not on file   Number of children: 2   Years of education: Not on file   Highest education level: Not on file  Occupational History   Occupation: Event organiser: [employer]  Tobacco Use   Smoking status: Never   Smokeless tobacco: Never  Vaping Use   Vaping status: Never Used  Substance and Sexual Activity   Alcohol use: No   Drug use: Not Currently    Types: Marijuana  Comment: quit 2018   Sexual activity: Not Currently    Birth control/protection: Surgical  Other Topics Concern   Not on file  Social History Narrative   ** Merged History Encounter **       Right Handed  Lives in a one story home    Social Determinants of Health   Financial Resource Strain: Medium Risk (11/03/2021)   Overall Financial Resource Strain (CARDIA)    Difficulty of Paying Living Expenses: Somewhat hard  Food Insecurity: No Food Insecurity (02/23/2023)    Hunger Vital Sign    Worried About Running Out of Food in the Last Year: Never true    Ran Out of Food in the Last Year: Never true  Transportation Needs: No Transportation Needs (02/23/2023)   PRAPARE - Administrator, Civil Service (Medical): No    Lack of Transportation (Non-Medical): No  Physical Activity: Sufficiently Active (11/03/2021)   Exercise Vital Sign    Days of Exercise per Week: 3 days    Minutes of Exercise per Session: 50 min  Stress: Stress Concern Present (11/03/2021)   [employer] of Occupational Health - Occupational Stress Questionnaire    Feeling of Stress : Very much  Social Connections: Moderately Isolated (11/03/2021)   Social Connection and Isolation Panel [NHANES]    Frequency of Communication with Friends and Family: Once a week    Frequency of Social Gatherings with Friends and Family: Once a week    Attends Religious Services: More than 4 times per year    Active Member of [employer] or Organizations: No    Attends Banker Meetings: Never    Marital Status: Living with partner  Intimate Partner Violence: Not At Risk (02/17/2023)   Humiliation, Afraid, Rape, and Kick questionnaire    Fear of Current or Ex-Partner: No    Emotionally Abused: No    Physically Abused: No    Sexually Abused: No    FAMILY HISTORY: Family History  Problem Relation Age of Onset   Hypertension Mother    Hypertension Maternal Aunt    Breast cancer Maternal Grandmother        dx > 50   Stomach cancer Maternal Grandfather        dx > 50   Breast cancer Paternal Grandmother        dx > 50   Esophageal cancer Neg Hx    Rectal cancer Neg Hx    Colon cancer Neg Hx     Review of Systems  Constitutional:  Negative for appetite change, chills, fatigue, fever and unexpected weight change.  HENT:   Negative for hearing loss, lump/mass and trouble swallowing.   Eyes:  Negative for eye problems and icterus.  Respiratory:  Negative for chest tightness, cough  and shortness of breath.   Cardiovascular:  Negative for chest pain, leg swelling and palpitations.  Gastrointestinal:  Negative for abdominal distention, abdominal pain, constipation, diarrhea, nausea and vomiting.  Endocrine: Negative for hot flashes.  Genitourinary:  Negative for difficulty urinating.   Musculoskeletal:  Positive for arthralgias and gait problem.  Skin:  Negative for itching and rash.  Neurological:  Positive for gait problem and numbness. Negative for dizziness, extremity weakness, headaches, light-headedness, seizures and speech difficulty.  Hematological:  Negative for adenopathy. Does not bruise/bleed easily.  Psychiatric/Behavioral:  Negative for depression. The patient is not nervous/anxious.       PHYSICAL EXAMINATION   Onc Performance Status - 05/08/23 1000       KPS  SCALE   KPS % SCORE Normal, no compliants, no evidence of disease             Vitals:   05/08/23 1031  BP: 119/66  Pulse: 97  Resp: 17  Temp: 97.6 F (36.4 C)  SpO2: 100%    Physical Exam Constitutional:      General: She is not in acute distress.    Appearance: Normal appearance. She is not toxic-appearing.  HENT:     Head: Normocephalic and atraumatic.  Eyes:     General: No scleral icterus. Musculoskeletal:        General: No swelling.     Cervical back: Neck supple.  Skin:    General: Skin is warm and dry.     Findings: No rash.  Neurological:     General: No focal deficit present.     Mental Status: She is alert.  Psychiatric:        Mood and Affect: Mood normal.        Behavior: Behavior normal.     LABORATORY DATA:  Pending  ASSESSMENT and THERAPY PLAN:   Malignant neoplasm of upper-outer quadrant of right breast in female, estrogen receptor negative (HCC) 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, DCIS posterior margin positive, lymphovascular invasion not identified, ER 0%, PR 0%, Ki-67 85%, HER2  0 by IHC Margin excision: Posterior margin: Scattered foci of high-grade DCIS, 0/2 lymph nodes negative, final margins negative  (10/02/2022: Initial biopsy was DCIS ER 30% PR 2%)   Treatment plan: Adjuvant chemotherapy with dose dense Adriamycin and Cytoxan followed by Taxol   Adjuvant radiation therapy ------------------------------------------------- Current treatment: to begin adjuvant radiation.   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    Chemotherapy induced peripheral neuropathy Patient reports numbness, tingling, and burning pain in the legs, more pronounced on the left side, extending from the hip to the feet. Pain is also reported in the left groin. Patient is currently on gabapentin and naproxen for pain management. -Order labs to check blood sugars, kidney function, liver function, and vitamin levels to rule out other potential causes or contributing factors to neuropathy. -Prescribe oxycodone to be used initially for night time pain management, but can be used Q6H PRN monitor for side effects such as constipation and excessive tiredness. -PMP aware reviewed, no red flags -Reviewed goals of pain management.  -Schedule a follow-up in two weeks to assess pain management effectiveness. -Consider referral to neurology and physical therapy if pain continues to worsen or if it doesn't improve.   Recurrent Yeast Infections Patient reports recurrent yeast infections during chemotherapy. -Expect improvement now that chemotherapy has concluded. -Follow-up with GYN as recommended by their office  General Health Maintenance -Encourage patient to maintain hydration, consume a diet rich in fruits and vegetables, and avoid processed foods. -Advise patient to monitor for menopausal symptoms due to chemotherapy potentially inducing early menopause. -Encourage patient to  maintain physical activity to improve circulation and overall health.     All questions were answered. The patient knows to call the clinic with any problems, questions or concerns. We can certainly see the patient much sooner if necessary.  Total encounter time:30 minutes*in face-to-face visit time, chart review, lab review, care coordination, order entry, and documentation of the encounter time.    Lillard Anes, NP 05/08/23 11:30 AM Medical Oncology and Hematology [facility]  [address] [place], [place] [address] Tel. [phone]    Fax. [phone]  *Total Encounter Time as defined by the Centers for Medicare and Medicaid Services includes, in addition to the face-to-face time of a patient visit (documented in the note above) non-face-to-face time: obtaining and reviewing outside history, ordering and reviewing medications, tests or procedures, care coordination (communications with other health care professionals or caregivers) and documentation in the medical record.

## 2023-05-09 ENCOUNTER — Ambulatory Visit: Payer: 59

## 2023-05-09 ENCOUNTER — Other Ambulatory Visit: Payer: 59

## 2023-05-09 ENCOUNTER — Ambulatory Visit: Payer: 59 | Admitting: Hematology and Oncology

## 2023-05-16 ENCOUNTER — Other Ambulatory Visit: Payer: 59

## 2023-05-16 ENCOUNTER — Ambulatory Visit: Payer: 59

## 2023-05-16 ENCOUNTER — Ambulatory Visit: Payer: 59 | Admitting: Hematology and Oncology

## 2023-05-21 ENCOUNTER — Ambulatory Visit: Payer: Self-pay | Admitting: Licensed Clinical Social Worker

## 2023-05-21 NOTE — Progress Notes (Incomplete)
Location of Breast Cancer:  Malignant neoplasm of upper-outer quadrant of right breast in female, estrogen receptor negative  Histology per Pathology Report:  12/01/2022 A. BREAST, RIGHT INFERIOR MARGIN, EXCISION:  - Benign breast parenchyma with previous procedure-related changes  - Negative for in situ or invasive carcinoma  B. BREAST, RIGHT POSTERIOR MARGIN, EXCISION:  - Scattered foci of high-grade ductal carcinoma in situ  - New inked posterior margin is negative for carcinoma  - Previous procedure-related changes  C. LYMPH NODE, RIGHT, SENTINEL, EXCISION:  - Lymph node, negative for carcinoma (0/1)  D. LYMPH NODE, RIGHT, SENTINEL, EXCISION:  - Lymph node, negative for carcinoma (0/1)   11/13/2022 A. BREAST, RIGHT, LUMPECTOMY:  - Invasive ductal carcinoma, approximately 2 cm, grade 3  - Ductal carcinoma in situ: Present, solid, high-grade, with necrosis  - Margins, invasive: Inferior and posterior margins are positive for  carcinoma  - Margins, DCIS: Posterior margin is positive for ductal carcinoma in  situ  - Lymphovascular invasion: Not identified  - Prognostic markers: See comment  - See oncology table  COMMENT:  - Prognostic markers will be repeated on the resection specimen and  reported in an addendum.  Receptor Status: ER(Negative), PR (Negative), Her2-neu (Negative), Ki-67(85%)  Did patient present with symptoms (if so, please note symptoms) or was this found on screening mammography?: Screening mammogram  Past/Anticipated interventions by surgeon, if any:  12/01/2022 --Dr. Abigail Miyamoto RE-EXCISION OF RIGHT BREAST CANCER RIGHT DEEP AXILLARY SENTINEL NODE BIOPSY INJECTION OF MAG TRACE FOR LYMPH NODE MAPPING LEFT SUBCLAVIAN PORT A CATH INSERTION  11/13/2022 Dr. Abigail Miyamoto RIGHT BREAST LUMPECTOMY WITH RADIOACTIVE SEED LOCALIZATION   Past/Anticipated interventions by medical oncology, if any:  Under care of Dr. Serena Croissant 05/08/2023 Mardella Layman  [name]-NP's office note) --Treatment plan: Adjuvant chemotherapy with dose dense Adriamycin and Cytoxan followed by Taxol   Adjuvant radiation therapy --Current treatment: to begin adjuvant radiation.  Lymphedema issues, if any:  ***    Pain issues, if any:  ***   SAFETY ISSUES: Prior radiation? *** Pacemaker/ICD? *** Possible current pregnancy?***Tubal ligation Is the patient on methotrexate? ***  Current Complaints / other details:  ***

## 2023-05-21 NOTE — Progress Notes (Signed)
Radiation Oncology         [phone] ________________________________  Name: Laurie Bell MRN: [id number]  Date: 05/22/2023  DOB: 1980-12-20  Follow-Up Visit Note  Outpatient  CC: [name], [name], NP  Serena Croissant, MD  Diagnosis:   No diagnosis found.   Stage IB (pT1c, pN0, cM0) Right Breast UOQ, Invasive and in situ ductal carcinoma, ER- / PR- / Her2-, Grade 3: s/p right breast lumpectomy w/ right axillary SLN evaluation, followed by adjuvant chemotherapy   CHIEF COMPLAINT: Here to discuss management of right breast cancer  Narrative:  The patient returns today for follow-up.     Since her breast clinic consultation date of 10/11/22, she opted to proceed with a right breast lumpectomy without SLN biopsies on 11/13/22 under the care of Dr. Magnus Ivan. Pathology from the procedure revealed: tumor size of 2 cm; histology of grade 3 invasive ductal carcinoma with high grade DCIS; inferior and posterior margins positive for invasive carcinoma; posterior margin positive for DCIS;  ER status: 0% negative; PR status 0% negative; Proliferation marker Ki67 at 85%; Her2 status negative; Grade 3.  Bilateral breast MRI on 11/30/22 demonstrated an indeterminate mild ill-defined enhancement along the lateral, inferior, medial and posterior margins of the 4 x 5 cm right breast lumpectomy cavity. Differential considerations included postsurgical changes vs an ill-defined malignancy given positive margins. MRI otherwise showed no other suspicious findings within either breast, or evidence of lymphadenopathy.   She accordingly underwent re-excisions and right axillary SLN evaluation on 12/01/22. Pathology showed no evidence of residual invasive or in situ carcinoma in the final posterior and inferior margins. Nodal status of 2/2 right axillary SLN excisions negative for carcinoma.   Systemic therapy, if applicable, involved (dates and therapy as follows): She has been treated with adjuvant  chemotherapy consisting of dose dense Adriamycin and Cytoxan x 4 cycles followed by Taxol from 01/01/23 through 04/10/23 under the care of Dr. Pamelia Hoit. Chemo toxicities / side effects encountered by the patient included fatigue, insomnia, weight gain, nausea hypokalemia, peripheral neuropathy, recurrent yeast infections, and anxiety. She was also hospitalized on 01/21/23 after presenting with nasal burning, chest discomfort, and leukocytosis. Hospital work-up was negative and her symptoms were perceived as possibly secondary to Neulasta and Cytoxan. This prompted a dose reduction of both Adriamycin and Cytoxan as well as an infusion rate reduction of Cytoxan.  After completing 4 cycles of AC, she received her first dose of Taxol on 02/28/23. Taxol was ultimately discontinued early due to worsening peripheral neuropathy. She is currently on gabapentin and naproxen for pain management.   -- She was also seen in the ED on 01/21/23 for chest pain and abdominal pain which began following a chemotherapy infusion 5 days prior. Imaging performed included a CT AP with contrast which showed no acute abnormalities overall.   There is not role for antiestrogen therapy based on her negative ER status.   Of note: Prior to starting chemotherapy, the patient developed recurring headaches and was seen in the ED on 12/23/22. She had a CT of the head performed at that time which showed no acute intracranial abnormalities or evidence of intracranial metastatic disease. She was also hospitalized from 02/17/2023-02/21/2023 due to COVID pneumonia. She had a CTA of the chest performed in the ED on 09/07 which again showed no acute findings overall.   Symptomatically, the patient reports: ***        ALLERGIES:  is allergic to latex and tramadol.  Meds: Current Outpatient Medications  Medication  Sig Dispense Refill   acetaminophen (TYLENOL) 500 MG tablet Take 1,000 mg by mouth every 6 (six) hours as needed for moderate pain.      albuterol (VENTOLIN HFA) 108 (90 Base) MCG/ACT inhaler TAKE 2 PUFFS BY MOUTH EVERY 6 HOURS AS NEEDED FOR WHEEZE OR SHORTNESS OF BREATH (Patient taking differently: Inhale 2 puffs into the lungs every 6 (six) hours as needed for wheezing or shortness of breath.) 54 each 0   benzonatate (TESSALON) 200 MG capsule Take 1 capsule (200 mg total) by mouth 3 (three) times daily as needed for cough. 21 capsule 0   cyclobenzaprine (FLEXERIL) 10 MG tablet TAKE 1 TABLET BY MOUTH AT BEDTIME AS NEEDED FOR MUSCLE SPASMS (Patient taking differently: Take 10 mg by mouth at bedtime.) 30 tablet 1   esomeprazole (NEXIUM) 40 MG capsule Take 1 capsule (40 mg total) by mouth 2 (two) times daily before a meal. 60 capsule 3   fluconazole (DIFLUCAN) 150 MG tablet Take 1 tablet (150 mg total) by mouth every three (3) days as needed (Yeast infection). 3 tablet 0   fluconazole (DIFLUCAN) 150 MG tablet Take 1 tablet (150 mg total) by mouth every other day as needed. 2 tablet 2   gabapentin (NEURONTIN) 400 MG capsule Take 400 mg twice a day (morning and noon) and 800 mg at bedtime. 56 capsule 0   hydrocortisone (ANUSOL-HC) 2.5 % rectal cream Place rectally 4 (four) times daily as needed for hemorrhoids or anal itching. 30 g 0   hydrOXYzine (ATARAX) 25 MG tablet TAKE 1 TABLET(25 MG) BY MOUTH THREE TIMES DAILY 270 tablet 1   lidocaine (XYLOCAINE) 2 % solution Use as directed 15 mLs in the mouth or throat every 4 (four) hours as needed for mouth pain.     lidocaine-prilocaine (EMLA) cream Apply topically daily.     linaclotide (LINZESS) 145 MCG CAPS capsule Take 1 capsule (145 mcg total) by mouth daily before breakfast. 30 capsule 5   methimazole (TAPAZOLE) 5 MG tablet Take 1 tablet (5 mg total) by mouth as directed. 1 tablet Monday through Thursday,, skip Friday, Saturdays and  Sundays 52 tablet 3   metoprolol succinate (TOPROL-XL) 25 MG 24 hr tablet Take 0.5 tablets (12.5 mg total) by mouth daily. For increased heart rate 45 tablet 1    metroNIDAZOLE (FLAGYL) 500 MG tablet Take 1 tablet (500 mg total) by mouth 2 (two) times daily. 14 tablet 0   metroNIDAZOLE (METROGEL) 0.75 % vaginal gel Place 1 Applicatorful vaginally at bedtime. Use for 5 nights. 70 g 0   naproxen (NAPROSYN) 500 MG tablet TAKE 1 TABLET BY MOUTH TWICE A DAY AS NEEDED FOR PAIN 60 tablet 1   oxyCODONE (OXY IR/ROXICODONE) 5 MG immediate release tablet Take 1 tablet (5 mg total) by mouth every 6 (six) hours as needed for severe pain (pain score 7-10). 30 tablet 0   zolpidem (AMBIEN) 5 MG tablet Take 1 tablet (5 mg total) by mouth at bedtime as needed for sleep. 30 tablet 1   No current facility-administered medications for this encounter.    Physical Findings:  vitals were not taken for this visit. .     General: Alert and oriented, in no acute distress HEENT: Head is normocephalic. Extraocular movements are intact. Oropharynx is clear. Neck: Neck is supple, no palpable cervical or supraclavicular lymphadenopathy. Heart: Regular in rate and rhythm with no murmurs, rubs, or gallops. Chest: Clear to auscultation bilaterally, with no rhonchi, wheezes, or rales. Abdomen: Soft, nontender, nondistended, with  no rigidity or guarding. Extremities: No cyanosis or edema. Lymphatics: see Neck Exam Musculoskeletal: symmetric strength and muscle tone throughout. Neurologic: No obvious focalities. Speech is fluent.  Psychiatric: Judgment and insight are intact. Affect is appropriate. Breast exam reveals ***  Lab Findings: Lab Results  Component Value Date   WBC 5.6 05/08/2023   HGB 12.6 05/08/2023   HCT 37.4 05/08/2023   MCV 86.8 05/08/2023   PLT 238 05/08/2023    @LASTCHEMISTRY @  Radiographic Findings: No results found.  Impression/Plan: We discussed adjuvant radiotherapy today.  I recommend *** in order to ***.  I reviewed the logistics, benefits, risks, and potential side effects of this treatment in detail. Risks may include but not necessary be  limited to acute and late injury tissue in the radiation fields such as skin irritation (change in color/pigmentation, itching, dryness, pain, peeling). She may experience fatigue. We also discussed possible risk of long term cosmetic changes or scar tissue. There is also a smaller risk for lung toxicity, ***cardiac toxicity, ***brachial plexopathy, ***lymphedema, ***musculoskeletal changes, ***rib fragility or ***induction of a second malignancy, ***late chronic non-healing soft tissue wound.    The patient asked good questions which I answered to her satisfaction. She is enthusiastic about proceeding with treatment. A consent form has been *** signed and placed in her chart.  A total of *** medically necessary complex treatment devices will be fabricated and supervised by me: *** fields with MLCs for custom blocks to protect heart, and lungs;  and, a Vac-lok. MORE COMPLEX DEVICES MAY BE MADE IN DOSIMETRY FOR FIELD IN FIELD BEAMS FOR DOSE HOMOGENEITY.  I have requested : 3D Simulation which is medically necessary to give adequate dose to at risk tissues while sparing lungs and heart.  I have requested a DVH of the following structures: lungs, heart, *** lumpectomy cavity.    The patient will receive *** Gy in *** fractions to the *** with *** fields.  This will be *** followed by a boost.  On date of service, in total, I spent *** minutes on this encounter. Patient was seen in person.  _____________________________________   Lonie Peak, MD  This document serves as a record of services personally performed by Lonie Peak, MD. It was created on her behalf by Neena Rhymes, a trained medical scribe. The creation of this record is based on the scribe's personal observations and the provider's statements to them. This document has been checked and approved by the attending provider.

## 2023-05-22 ENCOUNTER — Ambulatory Visit
Admission: RE | Admit: 2023-05-22 | Discharge: 2023-05-22 | Disposition: A | Discharge status: Home / Self Care | Payer: Medicaid Other | Source: Ambulatory Visit | Attending: Radiation Oncology | Admitting: Radiation Oncology

## 2023-05-22 ENCOUNTER — Ambulatory Visit
Admission: RE | Admit: 2023-05-22 | Discharge: 2023-05-22 | Disposition: A | Discharge status: Home / Self Care | Payer: Medicaid Other | Source: Ambulatory Visit

## 2023-05-22 ENCOUNTER — Encounter: Payer: Self-pay | Admitting: Radiation Oncology

## 2023-05-22 VITALS — BP 124/88 | HR 96 | Resp 18 | Wt 211.0 lb

## 2023-05-22 VITALS — BP 124/88 | HR 83 | Resp 18 | Wt 211.0 lb

## 2023-05-22 DIAGNOSIS — C50411 Malignant neoplasm of upper-outer quadrant of right female breast: Secondary | ICD-10-CM | POA: Insufficient documentation

## 2023-05-22 DIAGNOSIS — Z9221 Personal history of antineoplastic chemotherapy: Secondary | ICD-10-CM | POA: Insufficient documentation

## 2023-05-22 DIAGNOSIS — Z171 Estrogen receptor negative status [ER-]: Secondary | ICD-10-CM | POA: Insufficient documentation

## 2023-05-22 DIAGNOSIS — Z79899 Other long term (current) drug therapy: Secondary | ICD-10-CM | POA: Insufficient documentation

## 2023-05-22 DIAGNOSIS — Z923 Personal history of irradiation: Secondary | ICD-10-CM | POA: Insufficient documentation

## 2023-05-22 DIAGNOSIS — G629 Polyneuropathy, unspecified: Secondary | ICD-10-CM | POA: Insufficient documentation

## 2023-05-22 DIAGNOSIS — Z510 Encounter for antineoplastic radiation therapy: Secondary | ICD-10-CM | POA: Insufficient documentation

## 2023-05-22 DIAGNOSIS — T451X5A Adverse effect of antineoplastic and immunosuppressive drugs, initial encounter: Secondary | ICD-10-CM | POA: Diagnosis not present

## 2023-05-22 LAB — PREGNANCY, URINE: Preg Test, Ur: NEGATIVE

## 2023-05-22 NOTE — Patient Instructions (Signed)
Visit Information  Thank you for taking time to visit with me today. Please don't hesitate to contact me if I can be of assistance to you.   Following are the goals we discussed today:   Goals Addressed             This Visit's Progress    Obtain Supportive Resources-Finances   On track    Activities and task to complete in order to accomplish goals.   Keep all upcoming appointments discussed today Continue with compliance of taking medication prescribed by Doctor Implement healthy coping skills discussed to assist with management of symptoms         Our next appointment is by telephone on 01/27 at 11 AM  Please call the care guide team at [phone] if you need to cancel or reschedule your appointment.   If you are experiencing a Mental Health or Behavioral Health Crisis or need someone to talk to, please call the Suicide and Crisis Lifeline: 988 call 911   Patient verbalizes understanding of instructions and care plan provided today and agrees to view in MyChart. Active MyChart status and patient understanding of how to access instructions and care plan via MyChart confirmed with patient.     Jenel Lucks, MSW, LCSW [facility] Care Management [place]  [facility] [email or web address] Phone [phone] 5:54 PM

## 2023-05-22 NOTE — Patient Outreach (Signed)
  Care Coordination   Follow Up Visit Note   05/21/2023 Name: Laurie Bell MRN: [id number] DOB: 1980-09-07  Laurie Bell is a 42 y.o. year old female who sees [name], Laurie Daughters, NP for primary care. I spoke with  Laurie Bell by phone today.  What matters to the patients health and wellness today?  Symptom Management    Goals Addressed             This Visit's Progress    Obtain Supportive Resources-Finances   On track    Activities and task to complete in order to accomplish goals.   Keep all upcoming appointments discussed today Continue with compliance of taking medication prescribed by Doctor Implement healthy coping skills discussed to assist with management of symptoms         SDOH assessments and interventions completed:  No     Care Coordination Interventions:  Yes, provided  Interventions Today    Flowsheet Row Most Recent Value  Chronic Disease   Chronic disease during today's visit Other  [MDD and GAD]  General Interventions   General Interventions Discussed/Reviewed General Interventions Reviewed, Doctor Visits  Doctor Visits Discussed/Reviewed Doctor Visits Reviewed  Mental Health Interventions   Mental Health Discussed/Reviewed Mental Health Reviewed, Coping Strategies  Pharmacy Interventions   Pharmacy Dicussed/Reviewed Pharmacy Topics Reviewed, Medication Adherence       Follow up plan: Follow up call scheduled for 4-6 weeks    Encounter Outcome:  Patient Visit Completed   Laurie Bell, MSW, LCSW [facility] Care Management [facility]  [facility] [email or web address] Phone [phone] 5:52 PM

## 2023-05-22 NOTE — Progress Notes (Signed)
Location of Breast Cancer:  Malignant neoplasm of upper-outer quadrant of right breast in female, estrogen receptor negative  Histology per Pathology Report:  12/01/2022 A. BREAST, RIGHT INFERIOR MARGIN, EXCISION:  - Benign breast parenchyma with previous procedure-related changes  - Negative for in situ or invasive carcinoma  B. BREAST, RIGHT POSTERIOR MARGIN, EXCISION:  - Scattered foci of high-grade ductal carcinoma in situ  - New inked posterior margin is negative for carcinoma  - Previous procedure-related changes  C. LYMPH NODE, RIGHT, SENTINEL, EXCISION:  - Lymph node, negative for carcinoma (0/1)  D. LYMPH NODE, RIGHT, SENTINEL, EXCISION:  - Lymph node, negative for carcinoma (0/1)   11/13/2022 A. BREAST, RIGHT, LUMPECTOMY:  - Invasive ductal carcinoma, approximately 2 cm, grade 3  - Ductal carcinoma in situ: Present, solid, high-grade, with necrosis  - Margins, invasive: Inferior and posterior margins are positive for  carcinoma  - Margins, DCIS: Posterior margin is positive for ductal carcinoma in  situ  - Lymphovascular invasion: Not identified  - Prognostic markers: See comment  - See oncology table  COMMENT:  - Prognostic markers will be repeated on the resection specimen and  reported in an addendum.  Receptor Status: ER(Negative), PR (Negative), Her2-neu (Negative), Ki-67(85%)  Did patient present with symptoms (if so, please note symptoms) or was this found on screening mammography?: Screening mammogram  Past/Anticipated interventions by surgeon, if any:  12/01/2022 --Dr. Abigail Miyamoto RE-EXCISION OF RIGHT BREAST CANCER RIGHT DEEP AXILLARY SENTINEL NODE BIOPSY INJECTION OF MAG TRACE FOR LYMPH NODE MAPPING LEFT SUBCLAVIAN PORT A CATH INSERTION  11/13/2022 Dr. Abigail Miyamoto RIGHT BREAST LUMPECTOMY WITH RADIOACTIVE SEED LOCALIZATION   Past/Anticipated interventions by medical oncology, if any:  Under care of Dr. Serena Croissant 05/08/2023 Mardella Layman  [name]-NP's office note) --Treatment plan: Adjuvant chemotherapy with dose dense Adriamycin and Cytoxan followed by Taxol   Adjuvant radiation therapy --Current treatment: to begin adjuvant radiation.  Lymphedema issues, if any:  none    Pain issues, if any:  none   SAFETY ISSUES: Prior radiation? none Pacemaker/ICD? none Possible current pregnancy?Tubal ligation Is the patient on methotrexate? na  Current Complaints / other details:      Vitals:   05/22/23 0910  BP: 124/88  Pulse: 96  Resp: 18  SpO2: 98%  Weight: 95.7 kg

## 2023-05-24 ENCOUNTER — Inpatient Hospital Stay: Payer: Medicaid Other | Attending: Hematology and Oncology | Admitting: Adult Health

## 2023-05-24 ENCOUNTER — Telehealth: Payer: Self-pay

## 2023-05-24 DIAGNOSIS — T451X5A Adverse effect of antineoplastic and immunosuppressive drugs, initial encounter: Secondary | ICD-10-CM | POA: Insufficient documentation

## 2023-05-24 DIAGNOSIS — Z9221 Personal history of antineoplastic chemotherapy: Secondary | ICD-10-CM | POA: Insufficient documentation

## 2023-05-24 DIAGNOSIS — Z923 Personal history of irradiation: Secondary | ICD-10-CM | POA: Insufficient documentation

## 2023-05-24 DIAGNOSIS — Z79899 Other long term (current) drug therapy: Secondary | ICD-10-CM | POA: Insufficient documentation

## 2023-05-24 DIAGNOSIS — G629 Polyneuropathy, unspecified: Secondary | ICD-10-CM | POA: Insufficient documentation

## 2023-05-24 DIAGNOSIS — C50411 Malignant neoplasm of upper-outer quadrant of right female breast: Secondary | ICD-10-CM | POA: Insufficient documentation

## 2023-05-24 NOTE — Telephone Encounter (Signed)
Called pt to check to see if she is coming to her appt today. She states she meant to r/s because she is not feeling well. She asks for [place] to have schedulers call her. Message sent to scheduling.

## 2023-05-25 DIAGNOSIS — Z510 Encounter for antineoplastic radiation therapy: Secondary | ICD-10-CM | POA: Diagnosis not present

## 2023-05-28 ENCOUNTER — Encounter: Payer: Self-pay | Admitting: *Deleted

## 2023-05-28 ENCOUNTER — Other Ambulatory Visit: Payer: Self-pay | Admitting: Adult Health

## 2023-05-28 ENCOUNTER — Ambulatory Visit
Admission: RE | Admit: 2023-05-28 | Discharge: 2023-05-28 | Disposition: A | Discharge status: Home / Self Care | Payer: Medicaid Other | Source: Ambulatory Visit | Attending: Internal Medicine | Admitting: Internal Medicine

## 2023-05-28 ENCOUNTER — Other Ambulatory Visit (HOSPITAL_COMMUNITY)
Admission: RE | Admit: 2023-05-28 | Discharge: 2023-05-28 | Disposition: A | Discharge status: Home / Self Care | Payer: Medicaid Other | Source: Ambulatory Visit | Attending: Interventional Radiology | Admitting: Interventional Radiology

## 2023-05-28 DIAGNOSIS — C50411 Malignant neoplasm of upper-outer quadrant of right female breast: Secondary | ICD-10-CM

## 2023-05-28 DIAGNOSIS — E042 Nontoxic multinodular goiter: Secondary | ICD-10-CM

## 2023-05-29 ENCOUNTER — Encounter: Payer: Self-pay | Admitting: Hematology and Oncology

## 2023-05-29 ENCOUNTER — Ambulatory Visit
Admission: RE | Admit: 2023-05-29 | Discharge: 2023-05-29 | Disposition: A | Discharge status: Home / Self Care | Payer: Medicaid Other | Source: Ambulatory Visit | Attending: Radiation Oncology | Admitting: Radiation Oncology

## 2023-05-29 ENCOUNTER — Other Ambulatory Visit: Payer: Self-pay

## 2023-05-29 ENCOUNTER — Encounter: Payer: Self-pay | Admitting: Internal Medicine

## 2023-05-29 DIAGNOSIS — Z510 Encounter for antineoplastic radiation therapy: Secondary | ICD-10-CM | POA: Diagnosis not present

## 2023-05-29 LAB — RAD ONC ARIA SESSION SUMMARY
Course Elapsed Days: 0
Plan Fractions Treated to Date: 1
Plan Prescribed Dose Per Fraction: 2.67 Gy
Plan Total Fractions Prescribed: 15
Plan Total Prescribed Dose: 40.05 Gy
Reference Point Dosage Given to Date: 2.67 Gy
Reference Point Session Dosage Given: 2.67 Gy
Session Number: 1

## 2023-05-29 LAB — CYTOLOGY - NON PAP

## 2023-05-30 ENCOUNTER — Ambulatory Visit
Admission: RE | Admit: 2023-05-30 | Discharge: 2023-05-30 | Disposition: A | Discharge status: Home / Self Care | Payer: Medicaid Other | Source: Ambulatory Visit | Attending: Radiation Oncology | Admitting: Radiation Oncology

## 2023-05-30 ENCOUNTER — Other Ambulatory Visit: Payer: Self-pay

## 2023-05-30 ENCOUNTER — Other Ambulatory Visit: Payer: Self-pay | Admitting: Physician Assistant

## 2023-05-30 DIAGNOSIS — Z510 Encounter for antineoplastic radiation therapy: Secondary | ICD-10-CM | POA: Diagnosis not present

## 2023-05-30 LAB — RAD ONC ARIA SESSION SUMMARY
Course Elapsed Days: 1
Plan Fractions Treated to Date: 2
Plan Prescribed Dose Per Fraction: 2.67 Gy
Plan Total Fractions Prescribed: 15
Plan Total Prescribed Dose: 40.05 Gy
Reference Point Dosage Given to Date: 5.34 Gy
Reference Point Session Dosage Given: 2.67 Gy
Session Number: 2

## 2023-05-31 ENCOUNTER — Other Ambulatory Visit: Payer: Self-pay

## 2023-05-31 ENCOUNTER — Ambulatory Visit: Payer: Medicaid Other | Admitting: Adult Health

## 2023-05-31 ENCOUNTER — Ambulatory Visit
Admission: RE | Admit: 2023-05-31 | Discharge: 2023-05-31 | Disposition: A | Discharge status: Home / Self Care | Payer: Medicaid Other | Source: Ambulatory Visit | Attending: Radiation Oncology | Admitting: Radiation Oncology

## 2023-05-31 DIAGNOSIS — Z510 Encounter for antineoplastic radiation therapy: Secondary | ICD-10-CM | POA: Diagnosis not present

## 2023-05-31 LAB — RAD ONC ARIA SESSION SUMMARY
Course Elapsed Days: 2
Plan Fractions Treated to Date: 3
Plan Prescribed Dose Per Fraction: 2.67 Gy
Plan Total Fractions Prescribed: 15
Plan Total Prescribed Dose: 40.05 Gy
Reference Point Dosage Given to Date: 8.01 Gy
Reference Point Session Dosage Given: 2.67 Gy
Session Number: 3

## 2023-05-31 NOTE — Telephone Encounter (Signed)
Discussed with patient's primary provider Lillard Anes NP who agrees with plan to refill Gabapentin. They will contact patient to schedule follow up appointment to further discuss patient's pain management.

## 2023-06-01 ENCOUNTER — Ambulatory Visit
Admission: RE | Admit: 2023-06-01 | Discharge: 2023-06-01 | Disposition: A | Discharge status: Home / Self Care | Payer: Medicaid Other | Source: Ambulatory Visit | Attending: Radiation Oncology | Admitting: Radiation Oncology

## 2023-06-01 ENCOUNTER — Other Ambulatory Visit: Payer: Self-pay

## 2023-06-01 DIAGNOSIS — Z510 Encounter for antineoplastic radiation therapy: Secondary | ICD-10-CM | POA: Diagnosis not present

## 2023-06-01 LAB — RAD ONC ARIA SESSION SUMMARY
Course Elapsed Days: 3
Plan Fractions Treated to Date: 4
Plan Prescribed Dose Per Fraction: 2.67 Gy
Plan Total Fractions Prescribed: 15
Plan Total Prescribed Dose: 40.05 Gy
Reference Point Dosage Given to Date: 10.68 Gy
Reference Point Session Dosage Given: 2.67 Gy
Session Number: 4

## 2023-06-04 ENCOUNTER — Ambulatory Visit
Admission: RE | Admit: 2023-06-04 | Discharge: 2023-06-04 | Disposition: A | Discharge status: Home / Self Care | Payer: Medicaid Other | Source: Ambulatory Visit | Attending: Radiation Oncology | Admitting: Radiation Oncology

## 2023-06-04 ENCOUNTER — Other Ambulatory Visit: Payer: Self-pay | Admitting: Radiation Oncology

## 2023-06-04 ENCOUNTER — Other Ambulatory Visit: Payer: Self-pay

## 2023-06-04 ENCOUNTER — Ambulatory Visit: Payer: Medicaid Other | Attending: Hematology and Oncology

## 2023-06-04 DIAGNOSIS — Z510 Encounter for antineoplastic radiation therapy: Secondary | ICD-10-CM | POA: Diagnosis not present

## 2023-06-04 DIAGNOSIS — Z483 Aftercare following surgery for neoplasm: Secondary | ICD-10-CM | POA: Insufficient documentation

## 2023-06-04 DIAGNOSIS — Z171 Estrogen receptor negative status [ER-]: Secondary | ICD-10-CM

## 2023-06-04 LAB — RAD ONC ARIA SESSION SUMMARY
Course Elapsed Days: 6
Plan Fractions Treated to Date: 5
Plan Prescribed Dose Per Fraction: 2.67 Gy
Plan Total Fractions Prescribed: 15
Plan Total Prescribed Dose: 40.05 Gy
Reference Point Dosage Given to Date: 13.35 Gy
Reference Point Session Dosage Given: 2.67 Gy
Session Number: 5

## 2023-06-04 MED ORDER — ONDANSETRON 8 MG PO TBDP: 8.0000 mg | ORAL_TABLET | Freq: Three times a day (TID) | ORAL | 2 refills | Status: DC | PRN

## 2023-06-04 MED ORDER — ALRA NON-METALLIC DEODORANT (RAD-ONC)
1.0000 | Freq: Once | TOPICAL | Status: AC
Start: 2023-06-04 — End: 2023-06-04
  Administered 2023-06-04: 1 via TOPICAL

## 2023-06-04 MED ORDER — RADIAPLEXRX EX GEL: Freq: Once | CUTANEOUS | Status: AC

## 2023-06-05 ENCOUNTER — Ambulatory Visit
Admission: RE | Admit: 2023-06-05 | Discharge: 2023-06-05 | Disposition: A | Discharge status: Home / Self Care | Payer: Medicaid Other | Source: Ambulatory Visit | Attending: Radiation Oncology

## 2023-06-05 ENCOUNTER — Other Ambulatory Visit: Payer: Self-pay

## 2023-06-05 DIAGNOSIS — Z510 Encounter for antineoplastic radiation therapy: Secondary | ICD-10-CM | POA: Diagnosis not present

## 2023-06-05 LAB — RAD ONC ARIA SESSION SUMMARY
Course Elapsed Days: 7
Plan Fractions Treated to Date: 6
Plan Prescribed Dose Per Fraction: 2.67 Gy
Plan Total Fractions Prescribed: 15
Plan Total Prescribed Dose: 40.05 Gy
Reference Point Dosage Given to Date: 16.02 Gy
Reference Point Session Dosage Given: 2.67 Gy
Session Number: 6

## 2023-06-07 ENCOUNTER — Ambulatory Visit
Admission: RE | Admit: 2023-06-07 | Discharge: 2023-06-07 | Disposition: A | Discharge status: Home / Self Care | Payer: Medicaid Other | Source: Ambulatory Visit | Attending: Radiation Oncology | Admitting: Radiation Oncology

## 2023-06-07 ENCOUNTER — Other Ambulatory Visit: Payer: Self-pay

## 2023-06-07 DIAGNOSIS — Z510 Encounter for antineoplastic radiation therapy: Secondary | ICD-10-CM | POA: Diagnosis not present

## 2023-06-07 LAB — RAD ONC ARIA SESSION SUMMARY
Course Elapsed Days: 9
Plan Fractions Treated to Date: 7
Plan Prescribed Dose Per Fraction: 2.67 Gy
Plan Total Fractions Prescribed: 15
Plan Total Prescribed Dose: 40.05 Gy
Reference Point Dosage Given to Date: 18.69 Gy
Reference Point Session Dosage Given: 2.67 Gy
Session Number: 7

## 2023-06-08 ENCOUNTER — Ambulatory Visit
Admission: RE | Admit: 2023-06-08 | Discharge: 2023-06-08 | Disposition: A | Discharge status: Home / Self Care | Payer: Medicaid Other | Source: Ambulatory Visit | Attending: Radiation Oncology

## 2023-06-08 ENCOUNTER — Inpatient Hospital Stay (HOSPITAL_BASED_OUTPATIENT_CLINIC_OR_DEPARTMENT_OTHER): Payer: Medicaid Other | Admitting: Hematology and Oncology

## 2023-06-08 ENCOUNTER — Other Ambulatory Visit: Payer: Self-pay

## 2023-06-08 ENCOUNTER — Ambulatory Visit
Admission: RE | Admit: 2023-06-08 | Discharge: 2023-06-08 | Disposition: A | Discharge status: Home / Self Care | Payer: Medicaid Other | Source: Ambulatory Visit | Attending: Radiation Oncology | Admitting: Radiation Oncology

## 2023-06-08 VITALS — BP 124/70 | HR 93 | Temp 98.2°F | Resp 15 | Wt 208.7 lb

## 2023-06-08 DIAGNOSIS — C50411 Malignant neoplasm of upper-outer quadrant of right female breast: Secondary | ICD-10-CM

## 2023-06-08 DIAGNOSIS — Z171 Estrogen receptor negative status [ER-]: Secondary | ICD-10-CM

## 2023-06-08 DIAGNOSIS — Z510 Encounter for antineoplastic radiation therapy: Secondary | ICD-10-CM | POA: Diagnosis not present

## 2023-06-08 LAB — RAD ONC ARIA SESSION SUMMARY
Course Elapsed Days: 10
Plan Fractions Treated to Date: 8
Plan Prescribed Dose Per Fraction: 2.67 Gy
Plan Total Fractions Prescribed: 15
Plan Total Prescribed Dose: 40.05 Gy
Reference Point Dosage Given to Date: 21.36 Gy
Reference Point Session Dosage Given: 2.67 Gy
Session Number: 8

## 2023-06-08 MED ORDER — ZOLPIDEM TARTRATE 5 MG PO TABS: 5.0000 mg | ORAL_TABLET | Freq: Every evening | ORAL | 3 refills | Status: DC | PRN

## 2023-06-08 NOTE — Assessment & Plan Note (Signed)
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, DCIS posterior margin positive, lymphovascular invasion not identified, ER 0%, PR 0%, Ki-67 85%, HER2 0 by IHC Margin excision: Posterior margin: Scattered foci of high-grade DCIS, 0/2 lymph nodes negative, final margins negative  (10/02/2022: Initial biopsy was DCIS ER 30% PR 2%)   Treatment plan: Adjuvant chemotherapy with dose dense Adriamycin and Cytoxan followed by Taxol x 7: 01/01/2023-04/10/2023 Adjuvant radiation therapy 05/30/2023-06/27/2023 ------------------------------------------------- Current treatment: After radiation is complete patient will be on surveillance with guardant reveal MRD monitoring. 66-month follow-up for survivorship care plan visit

## 2023-06-08 NOTE — Progress Notes (Signed)
Patient Care Team: [name], Laurie Daughters, NP as PCP - General (Hematology and Oncology) Corky Crafts, MD as PCP - Cardiology (Cardiology) Glendale Chard, DO as Consulting Physician (Neurology) Abigail Miyamoto, MD as Consulting Physician (General Surgery) Serena Croissant, MD as Consulting Physician (Hematology and Oncology) Lonie Peak, MD as Attending Physician (Radiation Oncology) Pershing Proud, RN as Oncology Nurse Navigator Donnelly Angelica, RN as Oncology Nurse Navigator Bridgett Larsson, LCSW as Social Worker (Licensed Clinical Social Worker)  DIAGNOSIS:  Encounter Diagnosis  Name Primary?   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, ER 30% weak, PR 2%   10/11/2022 Cancer Staging   Staging form: Breast, AJCC 8th Edition - Clinical: Stage 0 (cTis (DCIS), cN0, cM0, G3, ER+, PR+, HER2: Not Assessed) - Signed by Serena Croissant, MD on 10/11/2022 Stage prefix: Initial diagnosis Histologic grading system: 3 grade system   10/11/2022 Genetic Testing   Declined hereditary cancer genetic testing    11/13/2022 Surgery   Right lumpectomy: Grade 3 IDC, 2 cm in size inferior and posterior margins are positive, DCIS posterior margin positive, lymphovascular invasion not identified, ER 0%, PR 0%, Ki-67 85%, HER2 0 by [facility]   11/13/2022 Cancer Staging   Staging form: Breast, AJCC 8th Edition - Pathologic stage from 11/13/2022: Stage IB (pT1c, pN0, cM0, G3, ER-, PR-, HER2-) - Signed by Lonie Peak, MD on 05/21/2023 Stage prefix: Initial diagnosis Histologic grading system: 3 grade system    12/01/2022 Surgery   Margin excision: Posterior margin: Scattered foci of high-grade DCIS, 0/2 lymph nodes negative   01/01/2023 - 04/10/2023 Chemotherapy   Patient is on Treatment Plan : BREAST ADJUVANT DOSE DENSE AC q14d / PACLitaxel q7d       CHIEF COMPLIANT: Follow-up to discuss neuropathy concerns  HISTORY OF PRESENT ILLNESS:   History of Present Illness   Laurie Bell, a patient with a history of breast cancer currently undergoing radiation treatment, presents with persistent leg pain and fatigue. She describes the leg pain as a numbness that extends from the thigh to the feet, accompanied by a sharp, tingling pain. Despite attempts to alleviate the numbness by moving her feet, the numbness persists and often leads to pain. She has tried various medications for the pain, including gabapentin and oxycodone, but these have only provided temporary relief. She also reports that the radiation treatment has been causing significant fatigue, to the point of needing to sleep for several hours after treatment. Additionally, she has noticed mood changes and increased feelings of depression since starting radiation. She also mentions a history of thyroid issues, for which she is on medication, and anxiety.         ALLERGIES:  is allergic to latex and tramadol.  MEDICATIONS:  Current Outpatient Medications  Medication Sig Dispense Refill   acetaminophen (TYLENOL) 500 MG tablet Take 1,000 mg by mouth every 6 (six) hours as needed for moderate pain.     albuterol (VENTOLIN HFA) 108 (90 Base) MCG/ACT inhaler TAKE 2 PUFFS BY MOUTH EVERY 6 HOURS AS NEEDED FOR WHEEZE OR SHORTNESS OF BREATH (Patient taking differently: Inhale 2 puffs into the lungs every 6 (six) hours as needed for  wheezing or shortness of breath.) 54 each 0   cyclobenzaprine (FLEXERIL) 10 MG tablet TAKE 1 TABLET BY MOUTH AT BEDTIME AS NEEDED FOR MUSCLE SPASMS (Patient taking differently: Take 10 mg by mouth at bedtime.) 30 tablet 1    esomeprazole (NEXIUM) 40 MG capsule Take 1 capsule (40 mg total) by mouth 2 (two) times daily before a meal. 60 capsule 3   gabapentin (NEURONTIN) 400 MG capsule TAKE 400 MG TWICE A DAY (MORNING AND NOON) AND 800 MG AT BEDTIME. 56 capsule 0   hydrOXYzine (ATARAX) 25 MG tablet TAKE 1 TABLET(25 MG) BY MOUTH THREE TIMES DAILY 270 tablet 1   linaclotide (LINZESS) 145 MCG CAPS capsule Take 1 capsule (145 mcg total) by mouth daily before breakfast. (Patient not taking: Reported on 05/22/2023) 30 capsule 5   methimazole (TAPAZOLE) 5 MG tablet Take 1 tablet (5 mg total) by mouth as directed. 1 tablet Monday through Thursday,, skip Friday, Saturdays and  Sundays 52 tablet 3   metoprolol succinate (TOPROL-XL) 25 MG 24 hr tablet Take 0.5 tablets (12.5 mg total) by mouth daily. For increased heart rate 45 tablet 1   naproxen (NAPROSYN) 500 MG tablet TAKE 1 TABLET BY MOUTH TWICE A DAY AS NEEDED FOR PAIN 60 tablet 1   zolpidem (AMBIEN) 5 MG tablet Take 1 tablet (5 mg total) by mouth at bedtime as needed for sleep. 30 tablet 3   No current facility-administered medications for this visit.    PHYSICAL EXAMINATION: ECOG PERFORMANCE STATUS: 1 - Symptomatic but completely ambulatory  Vitals:   06/08/23 0959  BP: 124/70  Pulse: 93  Resp: 15  Temp: 98.2 F (36.8 C)  SpO2: 100%   Filed Weights   06/08/23 0959  Weight: 208 lb 11.2 oz (94.7 kg)     LABORATORY DATA:  I have reviewed the data as listed    Latest Ref Rng & Units 05/08/2023   11:21 AM 04/24/2023   10:21 AM 04/10/2023   10:25 AM  CMP  Glucose 70 - 99 mg/dL 956  213  96   BUN 6 - 20 mg/dL 9  10  10    Creatinine 0.44 - 1.00 mg/dL 0.86  5.78  4.69   Sodium 135 - 145 mmol/L 140  141  141   Potassium 3.5 - 5.1 mmol/L 3.3  3.3  3.8   Chloride 98 - 111 mmol/L 106  108  107   CO2 22 - 32 mmol/L 29  28  27    Calcium 8.9 - 10.3 mg/dL 9.6  9.2  9.5   Total Protein 6.5 - 8.1 g/dL 7.3  6.6  7.0   Total Bilirubin <1.2 mg/dL 0.5  0.4  0.3    Alkaline Phos 38 - 126 U/L 143  124  110   AST 15 - 41 U/L 39  37  21   ALT 0 - 44 U/L 62  67  37     Lab Results  Component Value Date   WBC 5.6 05/08/2023   HGB 12.6 05/08/2023   HCT 37.4 05/08/2023   MCV 86.8 05/08/2023   PLT 238 05/08/2023   NEUTROABS 3.0 05/08/2023    ASSESSMENT & PLAN:  Malignant neoplasm of upper-outer quadrant of right breast in female, estrogen receptor negative (HCC) 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, DCIS posterior margin positive, lymphovascular invasion not identified, ER 0%, PR 0%, Ki-67 85%, HER2 0 by IHC Margin excision: Posterior margin: Scattered foci  of high-grade DCIS, 0/2 lymph nodes negative, final margins negative  (10/02/2022: Initial biopsy was DCIS ER 30% PR 2%)   Treatment plan: Adjuvant chemotherapy with dose dense Adriamycin and Cytoxan followed by Taxol x 7: 01/01/2023-04/10/2023 Adjuvant radiation therapy 05/30/2023-06/27/2023 ------------------------------------------------- Current treatment: After radiation is complete patient will be on surveillance with guardant reveal MRD monitoring. 53-month follow-up Mammograms will be done in July 2025 ------------------------------------- Assessment and Plan    Chemotherapy-induced neuropathy Persistent leg pain and numbness despite high-dose gabapentin and oxycodone. Cymbalta was ineffective and caused unwanted side effects. -Consider referral to a neurologist for further management options. -Trial of over-the-counter supplement PEA with luteolin, as well as magnesium supplement and topical CBD oil as potential adjunctive therapies. -Continue oxycodone as needed, with caution regarding long-term use.  Breast Cancer Surveillance  -Initiate Gardant Reveal blood test every six months for early detection of potential recurrence. -Plan for mammogram six months post-radiation therapy.  Port Removal Port no longer needed  post-radiation therapy. Sent a message to Dr. Eliberto Ivory  for port removal.  Medication Reconciliation Multiple medications for various conditions. -Refill Ambien prescription -Continue other current medications as directed.  Follow-up In six months for continued surveillance and management of neuropathy.          No orders of the defined types were placed in this encounter.  The patient has a good understanding of the overall plan. she agrees with it. she will call with any problems that may develop before the next visit here. Total time spent: 30 mins including face to face time and time spent for planning, charting and co-ordination of care   Tamsen Meek, MD 06/08/23

## 2023-06-11 ENCOUNTER — Other Ambulatory Visit: Payer: Self-pay

## 2023-06-11 ENCOUNTER — Ambulatory Visit: Payer: Medicaid Other | Admitting: Radiation Oncology

## 2023-06-11 ENCOUNTER — Ambulatory Visit: Payer: Medicaid Other | Attending: Family Medicine | Admitting: Family Medicine

## 2023-06-11 ENCOUNTER — Ambulatory Visit
Admission: RE | Admit: 2023-06-11 | Discharge: 2023-06-11 | Disposition: A | Discharge status: Home / Self Care | Payer: Medicaid Other | Source: Ambulatory Visit | Attending: Radiation Oncology

## 2023-06-11 DIAGNOSIS — Z510 Encounter for antineoplastic radiation therapy: Secondary | ICD-10-CM | POA: Diagnosis not present

## 2023-06-11 LAB — RAD ONC ARIA SESSION SUMMARY
Course Elapsed Days: 13
Plan Fractions Treated to Date: 9
Plan Prescribed Dose Per Fraction: 2.67 Gy
Plan Total Fractions Prescribed: 15
Plan Total Prescribed Dose: 40.05 Gy
Reference Point Dosage Given to Date: 24.03 Gy
Reference Point Session Dosage Given: 2.67 Gy
Session Number: 9

## 2023-06-12 ENCOUNTER — Ambulatory Visit: Payer: Medicaid Other

## 2023-06-14 ENCOUNTER — Other Ambulatory Visit: Payer: Self-pay

## 2023-06-14 ENCOUNTER — Ambulatory Visit
Admission: RE | Admit: 2023-06-14 | Discharge: 2023-06-14 | Disposition: A | Discharge status: Home / Self Care | Payer: Medicaid Other | Source: Ambulatory Visit | Attending: Radiation Oncology | Admitting: Radiation Oncology

## 2023-06-14 DIAGNOSIS — Z510 Encounter for antineoplastic radiation therapy: Secondary | ICD-10-CM | POA: Diagnosis not present

## 2023-06-14 DIAGNOSIS — M25511 Pain in right shoulder: Secondary | ICD-10-CM | POA: Insufficient documentation

## 2023-06-14 DIAGNOSIS — C50411 Malignant neoplasm of upper-outer quadrant of right female breast: Secondary | ICD-10-CM | POA: Insufficient documentation

## 2023-06-14 DIAGNOSIS — Z171 Estrogen receptor negative status [ER-]: Secondary | ICD-10-CM | POA: Diagnosis present

## 2023-06-14 LAB — RAD ONC ARIA SESSION SUMMARY
Course Elapsed Days: 16
Plan Fractions Treated to Date: 10
Plan Prescribed Dose Per Fraction: 2.67 Gy
Plan Total Fractions Prescribed: 15
Plan Total Prescribed Dose: 40.05 Gy
Reference Point Dosage Given to Date: 26.7 Gy
Reference Point Session Dosage Given: 2.67 Gy
Session Number: 10

## 2023-06-15 ENCOUNTER — Ambulatory Visit
Admission: RE | Admit: 2023-06-15 | Discharge: 2023-06-15 | Disposition: A | Discharge status: Home / Self Care | Payer: Medicaid Other | Source: Ambulatory Visit | Attending: Radiation Oncology | Admitting: Radiation Oncology

## 2023-06-15 ENCOUNTER — Other Ambulatory Visit: Payer: Self-pay

## 2023-06-15 ENCOUNTER — Other Ambulatory Visit: Payer: Self-pay | Admitting: Surgery

## 2023-06-15 DIAGNOSIS — Z510 Encounter for antineoplastic radiation therapy: Secondary | ICD-10-CM | POA: Diagnosis not present

## 2023-06-15 LAB — RAD ONC ARIA SESSION SUMMARY
Course Elapsed Days: 17
Plan Fractions Treated to Date: 11
Plan Prescribed Dose Per Fraction: 2.67 Gy
Plan Total Fractions Prescribed: 15
Plan Total Prescribed Dose: 40.05 Gy
Reference Point Dosage Given to Date: 29.37 Gy
Reference Point Session Dosage Given: 2.67 Gy
Session Number: 11

## 2023-06-18 ENCOUNTER — Ambulatory Visit
Admission: RE | Admit: 2023-06-18 | Discharge: 2023-06-18 | Disposition: A | Discharge status: Home / Self Care | Payer: Medicaid Other | Source: Ambulatory Visit | Attending: Radiation Oncology | Admitting: Radiation Oncology

## 2023-06-18 ENCOUNTER — Other Ambulatory Visit: Payer: Self-pay

## 2023-06-18 DIAGNOSIS — C50411 Malignant neoplasm of upper-outer quadrant of right female breast: Secondary | ICD-10-CM

## 2023-06-18 DIAGNOSIS — Z510 Encounter for antineoplastic radiation therapy: Secondary | ICD-10-CM | POA: Diagnosis not present

## 2023-06-18 LAB — RAD ONC ARIA SESSION SUMMARY
Course Elapsed Days: 20
Plan Fractions Treated to Date: 12
Plan Prescribed Dose Per Fraction: 2.67 Gy
Plan Total Fractions Prescribed: 15
Plan Total Prescribed Dose: 40.05 Gy
Reference Point Dosage Given to Date: 32.04 Gy
Reference Point Session Dosage Given: 2.67 Gy
Session Number: 12

## 2023-06-19 ENCOUNTER — Other Ambulatory Visit: Payer: Self-pay

## 2023-06-19 ENCOUNTER — Ambulatory Visit
Admission: RE | Admit: 2023-06-19 | Discharge: 2023-06-19 | Disposition: A | Discharge status: Home / Self Care | Payer: Medicaid Other | Source: Ambulatory Visit | Attending: Radiation Oncology | Admitting: Radiation Oncology

## 2023-06-19 DIAGNOSIS — Z510 Encounter for antineoplastic radiation therapy: Secondary | ICD-10-CM | POA: Diagnosis not present

## 2023-06-19 LAB — RAD ONC ARIA SESSION SUMMARY
Course Elapsed Days: 21
Plan Fractions Treated to Date: 13
Plan Prescribed Dose Per Fraction: 2.67 Gy
Plan Total Fractions Prescribed: 15
Plan Total Prescribed Dose: 40.05 Gy
Reference Point Dosage Given to Date: 34.71 Gy
Reference Point Session Dosage Given: 2.67 Gy
Session Number: 13

## 2023-06-20 ENCOUNTER — Other Ambulatory Visit: Payer: Self-pay | Admitting: Radiation Oncology

## 2023-06-20 ENCOUNTER — Ambulatory Visit: Payer: Medicaid Other

## 2023-06-20 ENCOUNTER — Other Ambulatory Visit: Payer: Self-pay

## 2023-06-20 ENCOUNTER — Telehealth: Payer: Self-pay | Admitting: Internal Medicine

## 2023-06-20 ENCOUNTER — Ambulatory Visit
Admission: RE | Admit: 2023-06-20 | Discharge: 2023-06-20 | Disposition: A | Discharge status: Home / Self Care | Payer: Medicaid Other | Source: Ambulatory Visit | Attending: Radiation Oncology | Admitting: Radiation Oncology

## 2023-06-20 DIAGNOSIS — Z171 Estrogen receptor negative status [ER-]: Secondary | ICD-10-CM

## 2023-06-20 DIAGNOSIS — Z510 Encounter for antineoplastic radiation therapy: Secondary | ICD-10-CM | POA: Diagnosis not present

## 2023-06-20 DIAGNOSIS — M25511 Pain in right shoulder: Secondary | ICD-10-CM

## 2023-06-20 LAB — RAD ONC ARIA SESSION SUMMARY
Course Elapsed Days: 22
Plan Fractions Treated to Date: 14
Plan Prescribed Dose Per Fraction: 2.67 Gy
Plan Total Fractions Prescribed: 15
Plan Total Prescribed Dose: 40.05 Gy
Reference Point Dosage Given to Date: 37.38 Gy
Reference Point Session Dosage Given: 2.67 Gy
Session Number: 14

## 2023-06-20 NOTE — Telephone Encounter (Signed)
 Inbound call from patient requesting a call to discuss if Dr. Leonides Schanz will give her a prescription for Esomeprazole. Patient states she has medicaid and they should approve the medication. Please advise.

## 2023-06-20 NOTE — Therapy (Signed)
 OUTPATIENT PHYSICAL THERAPY  UPPER EXTREMITY ONCOLOGY EVALUATION  Patient Name: Laurie Bell MRN: [id number] DOB:[birth year], 43 y.o., female Today's Date: 06/21/2023  END OF SESSION:  PT End of Session - 06/21/23 1256     Visit Number 1    Number of Visits 11    Date for PT Re-Evaluation 07/26/23    Authorization Type none needed    PT Start Time 1203    PT Stop Time 1256    PT Time Calculation (min) 53 min    Activity Tolerance Patient tolerated treatment well    Behavior During Therapy WFL for tasks assessed/performed             Past Medical History:  Diagnosis Date   Anxiety    Arthritis    Breast cancer (HCC)    Chlamydia contact, treated    GERD (gastroesophageal reflux disease)    Hyperthyroidism    Past Surgical History:  Procedure Laterality Date   AXILLARY SENTINEL NODE BIOPSY Right 12/01/2022   Procedure: RIGHT AXILLARY SENTINEL NODE BIOPSY;  Surgeon: Vernetta Berg, MD;  Location: [facility];  Service: General;  Laterality: Right;   BREAST BIOPSY Right 10/02/2022   MM RT BREAST BX W LOC DEV 1ST LESION IMAGE BX SPEC STEREO GUIDE 10/02/2022 [facility]   BREAST BIOPSY  11/10/2022   MM RT RADIOACTIVE SEED LOC MAMMO GUIDE 11/10/2022 [facility]   BREAST LUMPECTOMY WITH RADIOACTIVE SEED LOCALIZATION Right 11/13/2022   Procedure: RIGHT BREAST LUMPECTOMY WITH RADIOACTIVE SEED LOCALIZATION;  Surgeon: Vernetta Berg, MD;  Location: [facility];  Service: General;  Laterality: Right;   ENDOMETRIAL ABLATION N/A 04/25/2017   Procedure: ENDOMETRIAL ABLATION With NOVASURE;  Surgeon: Eveline Lynwood MATSU, MD;  Location: [facility];  Service: Gynecology;  Laterality: N/A;   ESOPHAGEAL MANOMETRY N/A 07/27/2021   Procedure: ESOPHAGEAL MANOMETRY (EM);  Surgeon: Federico Rosario BROCKS, MD;  Location: [facility];  Service: Gastroenterology;  Laterality: N/A;   PORTACATH PLACEMENT N/A 12/01/2022   Procedure: INSERTION PORT-A-CATH WITH ULTRASOUND  GUIDANCE;  Surgeon: Vernetta Berg, MD;  Location: [facility];  Service: General;  Laterality: N/A;   RE-EXCISION OF BREAST CANCER,SUPERIOR MARGINS Right 12/01/2022   Procedure: RE-EXCISION OF RIGHT BREAST CANCER;  Surgeon: Vernetta Berg, MD;  Location: [facility];  Service: General;  Laterality: Right;   TUBAL LIGATION  2003   Patient Active Problem List   Diagnosis Date Noted   Trichomonas vaginitis 03/28/2023   Friable cervix 03/27/2023   PCB (post coital bleeding) 03/27/2023   Screening for cervical cancer 03/27/2023   Sepsis (HCC) 02/17/2023   COVID-19 virus infection 02/17/2023   Lower extremity pain, anterior, right 01/22/2023   SIRS (systemic inflammatory response syndrome) (HCC) 01/21/2023   Port-A-Cath in place 01/01/2023   Malignant neoplasm of upper-outer quadrant of right breast in female, estrogen receptor negative (HCC) 10/09/2022   Multinodular goiter 03/22/2022   Hyperthyroidism 03/22/2022   MDD (major depressive disorder), recurrent episode, moderate (HCC) 11/03/2021   GAD (generalized anxiety disorder) 11/03/2021   Neck pain 10/28/2021   Cervical high risk HPV (human papillomavirus) test positive 10/17/2021   Irregular heart rhythm 04/15/2021   Numbness on left side 04/15/2021   Dysphagia 03/17/2021   Hypomagnesemia 02/23/2021   Gastroesophageal reflux disease    Hypokalemia 08/24/2020   Atypical chest pain 08/24/2020   Nausea and vomiting 08/24/2020   Hypocalcemia 08/24/2020   DUB (dysfunctional uterine bleeding) 01/12/2017   Fibroid, uterine 01/12/2017    PCP: Morna Kendall NP  REFERRING PROVIDER:  Lauraine Golden, MD  REFERRING DIAG:  Diagnosis  C50.411,Z17.1 (ICD-10-CM) - Malignant neoplasm of upper-outer quadrant of right breast in female, estrogen receptor negative (HCC)    THERAPY DIAG:  Aftercare following surgery for neoplasm  Malignant neoplasm of right breast in female, estrogen receptor negative, unspecified site of breast (HCC)  At risk for  lymphedema  Pain in right arm  ONSET DATE: 10/04/22  Rationale for Evaluation and Treatment: Rehabilitation  SUBJECTIVE:                                                                                                                                                                                           SUBJECTIVE STATEMENT: My shoulder started hurting in radiation.  No hx of shoulder problems.  I have 1 more week to go.  It is a constant pain from the top of my Rt shoulder down into my hand.  It will be numb and tingling intermittently.  No pattern to this.  It will be fingers 2-5 and the whole finger.  Tylenol  and gabapentin  are not helping.  Not swelling.   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 It measures 1 cm and is located in the upper-inner quadrant. It is ER 30%+, PR>1% +, HER 2 unknown and Ki 67 unknown. She had a right lumpectomy performed on 11/13/2022. 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 She also had several positive margins and is now s/p a Right Re-excision with SLNB on 12/01/2022 with 0/2 LN's.   PAIN:  Are you having pain? Yes NPRS scale: 8.5/10 Pain location: the whole Rt arm.   Pain orientation: Right  PAIN TYPE: aching and sharp Pain description: constant  Aggravating factors: It has just been staying bad Relieving factors: Nothing   PRECAUTIONS: None  RED FLAGS: None   WEIGHT BEARING RESTRICTIONS: No  FALLS:  Has patient fallen in last 6 months? 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    LEISURE: Nothing   HAND DOMINANCE: right   PRIOR LEVEL OF FUNCTION: Independent  PATIENT GOALS: decrease the arm pain    OBJECTIVE: Note: Objective measures were completed at Evaluation unless otherwise  noted.  COGNITION: Overall cognitive status: Within functional limits for tasks assessed   PALPATION: Pt reports it just feels irritated all dermatomes with testing but able to feel all locations.  +1-2 ttp supraspinatus, UT, anterior  shoulder on the Rt. Muscles noted to be more guarded vs very tight with STM.   OBSERVATIONS / OTHER ASSESSMENTS: holding Rt UE with left for comfort  POSTURE: guarded  UPPER EXTREMITY AROM/PROM:  A/PROM RIGHT   baseline  06/21/23  Shoulder extension 55 40 - pain  Shoulder flexion 150 148 - painful  Shoulder abduction 173 130- maybe more of an ulnar nn increase in tingling   Shoulder internal rotation 67   Shoulder external rotation 110 80 painful    (Blank rows = not tested)  A/PROM LEFT   baseline  Shoulder extension 45  Shoulder flexion 160  Shoulder abduction 180  Shoulder internal rotation 67  Shoulder external rotation 105    (Blank rows = not tested)  CERVICAL AROM: All stiff on the Rt UT region but without increase arm pain   UPPER EXTREMITY STRENGTH: Grip: Rt: 20# Lt: 38#  L-DEX LYMPHEDEMA SCREENING: The patient was assessed using the L-Dex machine today to produce a lymphedema index score:  L-DEX LYMPHEDEMA SCREENING Measurement Type: Unilateral L-DEX MEASUREMENT EXTREMITY: Upper Extremity POSITION : Standing DOMINANT SIDE: Right At Risk Side: Right BASELINE SCORE (UNILATERAL): 2.6 L-DEX SCORE (UNILATERAL): 6.1 VALUE CHANGE (UNILAT): 3.5   QUICK DASH SURVEY: 38% from 20.45%                                                                                                                            TREATMENT DATE:  06/21/23 Eval performed Sidelying STM with cocoa butter: Rt UT, supraspinatus, posterior shoulder with trigger point in supraspinatus up to the neck Scapular PROM Education on relaxation and monitoring mm and arm position when arm is hurting more as well as reasons for shoulder pain and arm pain with radiation.   Then STM in supine to Rt UT, anterior shoulder followed by MLD to the clavicular nodes, axillary nodes and anterior chest region Gave tg soft size small for comfort Pt will take a shower before radiation due to lotion used   PATIENT EDUCATION:  Education details: per today's note Person educated: Patient Education method: Medical Illustrator Education comprehension: verbalized understanding  HOME EXERCISE PROGRAM: Posture, scapular retractions, focusing on relaxation and shoulder positioning, try tg soft, circles to neck and axilla  ASSESSMENT:  CLINICAL IMPRESSION: Patient is a 43 y.o. female who was seen today for physical therapy evaluation and treatment for her new Rt arm pain during radiation.  Pt has a hx of UT/neck pain requiring PT and DN as well as thoracic outlet type symptoms.  These seem to be aggravated now as she finishes radiation. She has hypersensitivity to the Rt arm all dermatomes, decreased AROM with pain, increased edema at the trunk all contributing to the pain.  Her SOZO is elevated but no in the yellow so she was given a tg soft for a trial and reported comfort with it today.  Started STM today which was tolerated well and reports not feeling any more pain than  arriving.    OBJECTIVE IMPAIRMENTS: decreased activity tolerance, decreased knowledge of condition, decreased knowledge of use of DME, decreased ROM, decreased strength, impaired UE functional use, and pain.   ACTIVITY LIMITATIONS: carrying, lifting, sleeping, and reach over head  PARTICIPATION LIMITATIONS: meal prep, cleaning, laundry, and occupation  PERSONAL FACTORS: 1-2 comorbidities: SLNB, radiation  are also affecting patient's functional outcome.   REHAB POTENTIAL: Excellent  CLINICAL DECISION MAKING: Evolving/moderate complexity  EVALUATION COMPLEXITY: Moderate  GOALS: Goals reviewed with patient? Yes  SHORT TERM GOALS=LTGs: Target date: 07/26/23  Pt will decrease resting arm  pain to 0/10 Baseline: Goal status: INITIAL  2.  Pt will improve Rt UE AROM to within 5deg of her baseline Baseline:  Goal status: INITIAL  3.  Pt will be ind with final HEP for posture and edema management. Baseline:  Goal status: INITIAL   PLAN:  PT FREQUENCY: 2x/week  PT DURATION: 5 weeks   PLANNED INTERVENTIONS: [id number]- PT Re-evaluation, 97110-Therapeutic exercises, 97530- Therapeutic activity, 97112- Neuromuscular re-education, 97535- Self Care, [id number]- Manual therapy, Patient/Family education, Taping, Dry Needling, Therapeutic exercises, Therapeutic activity, and Neuromuscular re-education  PLAN FOR NEXT SESSION: Rt UE pain relief: STM supra, UT, neck as needed, PROM, MLD, nerve stretches - give formal stretches and exercises as able DN on POC if needed after radiation  Larue Saddie SAUNDERS, PT 06/21/2023, 12:58 PM

## 2023-06-21 ENCOUNTER — Encounter: Payer: Self-pay | Admitting: Rehabilitation

## 2023-06-21 ENCOUNTER — Ambulatory Visit: Payer: Medicaid Other | Admitting: Adult Health

## 2023-06-21 ENCOUNTER — Other Ambulatory Visit: Payer: Self-pay

## 2023-06-21 ENCOUNTER — Ambulatory Visit: Payer: Medicaid Other | Attending: Radiation Oncology | Admitting: Rehabilitation

## 2023-06-21 ENCOUNTER — Ambulatory Visit
Admission: RE | Admit: 2023-06-21 | Discharge: 2023-06-21 | Disposition: A | Discharge status: Home / Self Care | Payer: Medicaid Other | Source: Ambulatory Visit | Attending: Radiation Oncology | Admitting: Radiation Oncology

## 2023-06-21 DIAGNOSIS — C50411 Malignant neoplasm of upper-outer quadrant of right female breast: Secondary | ICD-10-CM | POA: Insufficient documentation

## 2023-06-21 DIAGNOSIS — Z9189 Other specified personal risk factors, not elsewhere classified: Secondary | ICD-10-CM | POA: Diagnosis present

## 2023-06-21 DIAGNOSIS — Z171 Estrogen receptor negative status [ER-]: Secondary | ICD-10-CM | POA: Diagnosis present

## 2023-06-21 DIAGNOSIS — M79601 Pain in right arm: Secondary | ICD-10-CM | POA: Diagnosis present

## 2023-06-21 DIAGNOSIS — Z483 Aftercare following surgery for neoplasm: Secondary | ICD-10-CM | POA: Insufficient documentation

## 2023-06-21 DIAGNOSIS — C50911 Malignant neoplasm of unspecified site of right female breast: Secondary | ICD-10-CM | POA: Insufficient documentation

## 2023-06-21 DIAGNOSIS — Z510 Encounter for antineoplastic radiation therapy: Secondary | ICD-10-CM | POA: Diagnosis not present

## 2023-06-21 LAB — RAD ONC ARIA SESSION SUMMARY
Course Elapsed Days: 23
Plan Fractions Treated to Date: 15
Plan Prescribed Dose Per Fraction: 2.67 Gy
Plan Total Fractions Prescribed: 15
Plan Total Prescribed Dose: 40.05 Gy
Reference Point Dosage Given to Date: 40.05 Gy
Reference Point Session Dosage Given: 2.67 Gy
Session Number: 15

## 2023-06-21 NOTE — Telephone Encounter (Signed)
 She was last seen in office in 2023. She had to cancel her Bravo study because she had not stopped her PPI. The second time she had to cancel due to starting treatment for breast cancer.  Please advise on refilling her medication.

## 2023-06-22 ENCOUNTER — Ambulatory Visit
Admission: RE | Admit: 2023-06-22 | Discharge: 2023-06-22 | Disposition: A | Discharge status: Home / Self Care | Payer: Medicaid Other | Source: Ambulatory Visit | Attending: Radiation Oncology

## 2023-06-22 ENCOUNTER — Encounter: Payer: Self-pay | Admitting: Radiation Oncology

## 2023-06-22 ENCOUNTER — Other Ambulatory Visit: Payer: Self-pay

## 2023-06-22 DIAGNOSIS — Z510 Encounter for antineoplastic radiation therapy: Secondary | ICD-10-CM | POA: Diagnosis not present

## 2023-06-22 LAB — RAD ONC ARIA SESSION SUMMARY
Course Elapsed Days: 24
Plan Fractions Treated to Date: 1
Plan Prescribed Dose Per Fraction: 2 Gy
Plan Total Fractions Prescribed: 5
Plan Total Prescribed Dose: 10 Gy
Reference Point Dosage Given to Date: 2 Gy
Reference Point Session Dosage Given: 2 Gy
Session Number: 16

## 2023-06-22 NOTE — Progress Notes (Signed)
 Last month, an MRI brain was ordered for this patient due to headaches and nausea.  This note is to document more thoroughly the rationale for this study.  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  This carries a significant risk for brain metastases and given that headaches and nausea are common signs of brain metastases, this is a medically necessary study that needs to be approved by her insurance without further delay.  Of note the patient is also experiencing severe shoulder pain which radiates down her right arm.  Given the risk of bone metastases from her breast cancer diagnosis, an MRI of the shoulder is also medically necessary.  There was no obvious sign of bone disease in her shoulder based on the CT angio chest study in September, and CTs are not as sensitive to rule out metastatic disease.  Again, I hope that this medically necessary study will be approved by her insurance without further delay -----------------------------------  Lauraine Golden, MD

## 2023-06-25 ENCOUNTER — Ambulatory Visit: Payer: Medicaid Other

## 2023-06-25 ENCOUNTER — Ambulatory Visit
Admission: RE | Admit: 2023-06-25 | Discharge: 2023-06-25 | Disposition: A | Discharge status: Home / Self Care | Payer: Medicaid Other | Source: Ambulatory Visit | Attending: Radiation Oncology

## 2023-06-25 ENCOUNTER — Other Ambulatory Visit: Payer: Self-pay

## 2023-06-25 DIAGNOSIS — Z510 Encounter for antineoplastic radiation therapy: Secondary | ICD-10-CM | POA: Diagnosis not present

## 2023-06-25 LAB — RAD ONC ARIA SESSION SUMMARY
Course Elapsed Days: 27
Plan Fractions Treated to Date: 2
Plan Prescribed Dose Per Fraction: 2 Gy
Plan Total Fractions Prescribed: 5
Plan Total Prescribed Dose: 10 Gy
Reference Point Dosage Given to Date: 4 Gy
Reference Point Session Dosage Given: 2 Gy
Session Number: 17

## 2023-06-25 MED ORDER — ESOMEPRAZOLE MAGNESIUM 40 MG PO CPDR: 40.0000 mg | DELAYED_RELEASE_CAPSULE | Freq: Two times a day (BID) | ORAL | 3 refills | Status: DC

## 2023-06-26 ENCOUNTER — Other Ambulatory Visit: Payer: Self-pay

## 2023-06-26 ENCOUNTER — Ambulatory Visit
Admission: RE | Admit: 2023-06-26 | Discharge: 2023-06-26 | Disposition: A | Discharge status: Home / Self Care | Payer: Medicaid Other | Source: Ambulatory Visit | Attending: Radiation Oncology | Admitting: Radiation Oncology

## 2023-06-26 DIAGNOSIS — Z510 Encounter for antineoplastic radiation therapy: Secondary | ICD-10-CM | POA: Diagnosis not present

## 2023-06-26 LAB — RAD ONC ARIA SESSION SUMMARY
Course Elapsed Days: 28
Plan Fractions Treated to Date: 3
Plan Prescribed Dose Per Fraction: 2 Gy
Plan Total Fractions Prescribed: 5
Plan Total Prescribed Dose: 10 Gy
Reference Point Dosage Given to Date: 6 Gy
Reference Point Session Dosage Given: 2 Gy
Session Number: 18

## 2023-06-27 ENCOUNTER — Other Ambulatory Visit: Payer: Self-pay

## 2023-06-27 ENCOUNTER — Ambulatory Visit
Admission: RE | Admit: 2023-06-27 | Discharge: 2023-06-27 | Disposition: A | Discharge status: Home / Self Care | Payer: Medicaid Other | Source: Ambulatory Visit | Attending: Radiation Oncology

## 2023-06-27 ENCOUNTER — Inpatient Hospital Stay (HOSPITAL_BASED_OUTPATIENT_CLINIC_OR_DEPARTMENT_OTHER): Payer: Medicaid Other | Attending: Hematology and Oncology | Admitting: Hematology and Oncology

## 2023-06-27 ENCOUNTER — Ambulatory Visit: Payer: Medicaid Other

## 2023-06-27 VITALS — BP 126/82 | HR 97 | Temp 97.6°F | Resp 18 | Ht 66.0 in | Wt 210.4 lb

## 2023-06-27 DIAGNOSIS — R200 Anesthesia of skin: Secondary | ICD-10-CM | POA: Insufficient documentation

## 2023-06-27 DIAGNOSIS — K5900 Constipation, unspecified: Secondary | ICD-10-CM | POA: Insufficient documentation

## 2023-06-27 DIAGNOSIS — M79606 Pain in leg, unspecified: Secondary | ICD-10-CM | POA: Insufficient documentation

## 2023-06-27 DIAGNOSIS — Z171 Estrogen receptor negative status [ER-]: Secondary | ICD-10-CM | POA: Diagnosis not present

## 2023-06-27 DIAGNOSIS — Z510 Encounter for antineoplastic radiation therapy: Secondary | ICD-10-CM | POA: Diagnosis not present

## 2023-06-27 DIAGNOSIS — Z79899 Other long term (current) drug therapy: Secondary | ICD-10-CM | POA: Insufficient documentation

## 2023-06-27 DIAGNOSIS — T451X5A Adverse effect of antineoplastic and immunosuppressive drugs, initial encounter: Secondary | ICD-10-CM | POA: Insufficient documentation

## 2023-06-27 DIAGNOSIS — C50411 Malignant neoplasm of upper-outer quadrant of right female breast: Secondary | ICD-10-CM | POA: Diagnosis not present

## 2023-06-27 DIAGNOSIS — G620 Drug-induced polyneuropathy: Secondary | ICD-10-CM | POA: Insufficient documentation

## 2023-06-27 DIAGNOSIS — Z853 Personal history of malignant neoplasm of breast: Secondary | ICD-10-CM | POA: Insufficient documentation

## 2023-06-27 DIAGNOSIS — G4700 Insomnia, unspecified: Secondary | ICD-10-CM | POA: Insufficient documentation

## 2023-06-27 LAB — RAD ONC ARIA SESSION SUMMARY
Course Elapsed Days: 29
Plan Fractions Treated to Date: 4
Plan Prescribed Dose Per Fraction: 2 Gy
Plan Total Fractions Prescribed: 5
Plan Total Prescribed Dose: 10 Gy
Reference Point Dosage Given to Date: 8 Gy
Reference Point Session Dosage Given: 2 Gy
Session Number: 19

## 2023-06-27 NOTE — Progress Notes (Signed)
 Patient Care Team: [name], Laura Polio, NP as PCP - General (Hematology and Oncology) Lucendia Rusk, MD as PCP - Cardiology (Cardiology) Daryel Ensign, DO as Consulting Physician (Neurology) Oza Blumenthal, MD as Consulting Physician (General Surgery) Cameron Cea, MD as Consulting Physician (Hematology and Oncology) Colie Dawes, MD as Attending Physician (Radiation Oncology) Auther Bo, RN as Oncology Nurse Navigator Alane Hsu, RN as Oncology Nurse Navigator Adriana Albany, LCSW as Social Worker (Licensed Clinical Social Worker)  DIAGNOSIS:  Encounter Diagnosis  Name Primary?   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   11/13/2022 Cancer Staging   Staging form: Breast, AJCC 8th Edition - Pathologic stage from 11/13/2022: Stage IB (pT1c, pN0, cM0, G3, ER-, PR-, HER2-) - Signed by Colie Dawes, MD on 05/21/2023 Stage prefix: Initial diagnosis Histologic grading system: 3 grade system    12/01/2022 Surgery   Margin excision: Posterior margin: Scattered foci of high-grade DCIS, 0/2 lymph nodes negative   01/01/2023 - 04/10/2023 Chemotherapy   Patient is on Treatment Plan : BREAST ADJUVANT DOSE DENSE AC q14d / PACLitaxel  q7d       CHIEF COMPLIANT: Follow-up after radiation is completed  HISTORY OF PRESENT ILLNESS:  History of Present Illness   Laurie Bell, a patient with a recent history of breast cancer, presents for her penultimate radiation treatment. She reports fatigue from both chemotherapy and radiation treatments, stating that radiation 'puts her to sleep.' She also notes some skin changes and mild soreness from radiation, but overall tolerates the treatment well.  In addition to her cancer treatment, Laurie Bell has been experiencing frequent headaches since starting radiation. She is unsure of the cause, but notes that the headaches have been persistent throughout the month of her radiation treatment.  Laurie Bell also mentions a slight memory issue, where she often forgets things that are right in front of her. She gives an example of looking for her phone while it was in her hand.         ALLERGIES:  is allergic to latex and tramadol .  MEDICATIONS:  Current Outpatient Medications  Medication Sig Dispense Refill   acetaminophen  (TYLENOL ) 500 MG tablet Take 1,000 mg by mouth every 6 (six) hours as needed for moderate pain.     albuterol  (VENTOLIN  HFA) 108 (90 Base) MCG/ACT inhaler TAKE 2 PUFFS BY MOUTH EVERY 6 HOURS AS NEEDED FOR WHEEZE OR SHORTNESS OF BREATH (Patient taking differently: Inhale 2 puffs into the lungs every 6 (six) hours as needed for wheezing or shortness of breath.) 54 each 0   cyclobenzaprine  (FLEXERIL ) 10  MG tablet TAKE 1 TABLET BY MOUTH AT BEDTIME AS NEEDED FOR MUSCLE SPASMS (Patient taking differently: Take 10 mg by mouth at bedtime.) 30 tablet 1   esomeprazole  (NEXIUM ) 40 MG capsule Take 1 capsule (40 mg total) by mouth 2 (two) times daily before a meal.  60 capsule 3   gabapentin  (NEURONTIN ) 400 MG capsule TAKE 400 MG TWICE A DAY (MORNING AND NOON) AND 800 MG AT BEDTIME. 56 capsule 0   hydrOXYzine  (ATARAX ) 25 MG tablet TAKE 1 TABLET(25 MG) BY MOUTH THREE TIMES DAILY 270 tablet 1   linaclotide  (LINZESS ) 145 MCG CAPS capsule Take 1 capsule (145 mcg total) by mouth daily before breakfast. (Patient not taking: Reported on 05/22/2023) 30 capsule 5   methimazole  (TAPAZOLE ) 5 MG tablet Take 1 tablet (5 mg total) by mouth as directed. 1 tablet Monday through Thursday,, skip Friday, Saturdays and  Sundays 52 tablet 3   metoprolol  succinate (TOPROL -XL) 25 MG 24 hr tablet Take 0.5 tablets (12.5 mg total) by mouth daily. For increased heart rate 45 tablet 1   naproxen  (NAPROSYN ) 500 MG tablet TAKE 1 TABLET BY MOUTH TWICE A DAY AS NEEDED FOR PAIN 60 tablet 1   zolpidem  (AMBIEN ) 5 MG tablet Take 1 tablet (5 mg total) by mouth at bedtime as needed for sleep. 30 tablet 3   No current facility-administered medications for this visit.    PHYSICAL EXAMINATION: ECOG PERFORMANCE STATUS: 1 - Symptomatic but completely ambulatory  Vitals:   06/27/23 1451  BP: 126/82  Pulse: 97  Resp: 18  Temp: 97.6 F (36.4 C)  SpO2: 100%   Filed Weights   06/27/23 1451  Weight: 210 lb 6.4 oz (95.4 kg)      LABORATORY DATA:  I have reviewed the data as listed    Latest Ref Rng & Units 05/08/2023   11:21 AM 04/24/2023   10:21 AM 04/10/2023   10:25 AM  CMP  Glucose 70 - 99 mg/dL 161  096  96   BUN 6 - 20 mg/dL 9  10  10    Creatinine 0.44 - 1.00 mg/dL 0.45  4.09  8.11   Sodium 135 - 145 mmol/L 140  141  141   Potassium 3.5 - 5.1 mmol/L 3.3  3.3  3.8   Chloride 98 - 111 mmol/L 106  108  107   CO2 22 - 32 mmol/L 29  28  27    Calcium  8.9 - 10.3 mg/dL 9.6  9.2  9.5   Total Protein 6.5 - 8.1 g/dL 7.3  6.6  7.0   Total Bilirubin <1.2 mg/dL 0.5  0.4  0.3   Alkaline Phos 38 - 126 U/L 143  124  110   AST 15 - 41 U/L 39  37  21   ALT 0 - 44 U/L 62  67  37     Lab  Results  Component Value Date   WBC 5.6 05/08/2023   HGB 12.6 05/08/2023   HCT 37.4 05/08/2023   MCV 86.8 05/08/2023   PLT 238 05/08/2023   NEUTROABS 3.0 05/08/2023    ASSESSMENT & PLAN:  Malignant neoplasm of upper-outer quadrant of right breast in female, estrogen receptor negative (HCC) 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, DCIS posterior margin positive, lymphovascular invasion not identified, ER 0%, PR 0%, Ki-67 85%, HER2 0 by IHC Margin excision: Posterior margin: Scattered foci of high-grade DCIS, 0/2 lymph nodes negative, final margins negative  (10/02/2022:  Initial biopsy was DCIS ER 30% PR 2%)   Treatment plan: Adjuvant chemotherapy with dose dense Adriamycin  and Cytoxan  followed by Taxol  x 7: 01/01/2023-04/10/2023 Adjuvant radiation therapy 05/30/2023-06/27/2023 ------------------------------------------------- Current treatment: surveillance with guardant reveal MRD monitoring.  Breast cancer surveillance: Mammograms will be done in July 2025    Chemotherapy-induced neuropathy Persistent leg pain and numbness despite high-dose gabapentin  and oxycodone   Return to clinic in 3 months for survivorship care plan visit ------------------------------------- Assessment and Plan    Breast Cancer Completion of radiation therapy. Reported fatigue and headaches during treatment. Discussed that radiation effects may persist for a month post-treatment. -Initiate surveillance with blood tests every six months. -Schedule survivorship appointment with nurse practitioner Heidi Llamas in three months. -Consider participation in memory loss trial.  Muscle Pain Continues to experience leg pain. -Continue Flexeril  and Gabapentin  as needed.  Constipation Has Linzess  available if needed. -Continue Linzess  as needed.  Insomnia Continues to use Ambien . -Continue Ambien  as needed.  Return to clinic in 3 months for survivorship  care plan visit         No orders of the defined types were placed in this encounter.  The patient has a good understanding of the overall plan. she agrees with it. she will call with any problems that may develop before the next visit here. Total time spent: 30 mins including face to face time and time spent for planning, charting and co-ordination of care   Viinay K Chasady Longwell, MD 06/27/23

## 2023-06-27 NOTE — Research (Signed)
 [id number]: COGNITIVE TRAINING FOR CANCER RELATED COGNITIVE IMPAIRMENT IN BREAST CANCER SURVIVORS: A MULTI-CENTER RANDOMIZED DOUBLE- BLINDED CONTROLLED TRIAL   Rec'd referral from Dr Lee Public. Called patient to follow up; her last radiation is tomorrow, and she must be out 6 months from treatment to enroll in the study. Patient verbalized understanding and agreed with plan for research team to follow up in 6 months if study is still open.  Victory Gravel Juliany Daughety, RN, BSN, [facility]  [facility] Clinical Research Nurse [facility] Direct Dial [phone] 06/27/2023 3:57 PM

## 2023-06-27 NOTE — Assessment & Plan Note (Signed)
 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, DCIS posterior margin positive, lymphovascular invasion not identified, ER 0%, PR 0%, Ki-67 85%, HER2 0 by IHC Margin excision: Posterior margin: Scattered foci of high-grade DCIS, 0/2 lymph nodes negative, final margins negative  (10/02/2022: Initial biopsy was DCIS ER 30% PR 2%)   Treatment plan: Adjuvant chemotherapy with dose dense Adriamycin  and Cytoxan  followed by Taxol  x 7: 01/01/2023-04/10/2023 Adjuvant radiation therapy 05/30/2023-06/27/2023 ------------------------------------------------- Current treatment: surveillance with guardant reveal MRD monitoring.  Breast cancer surveillance: Mammograms will be done in July 2025    Chemotherapy-induced neuropathy Persistent leg pain and numbness despite high-dose gabapentin  and oxycodone   Return to clinic in 3 months for survivorship care plan visit

## 2023-06-28 ENCOUNTER — Other Ambulatory Visit: Payer: Self-pay

## 2023-06-28 ENCOUNTER — Telehealth: Payer: Self-pay

## 2023-06-28 ENCOUNTER — Ambulatory Visit
Admission: RE | Admit: 2023-06-28 | Discharge: 2023-06-28 | Disposition: A | Discharge status: Home / Self Care | Payer: Medicaid Other | Source: Ambulatory Visit | Attending: Radiation Oncology | Admitting: Radiation Oncology

## 2023-06-28 DIAGNOSIS — Z510 Encounter for antineoplastic radiation therapy: Secondary | ICD-10-CM | POA: Diagnosis not present

## 2023-06-28 LAB — RAD ONC ARIA SESSION SUMMARY
Course Elapsed Days: 30
Plan Fractions Treated to Date: 5
Plan Prescribed Dose Per Fraction: 2 Gy
Plan Total Fractions Prescribed: 5
Plan Total Prescribed Dose: 10 Gy
Reference Point Dosage Given to Date: 10 Gy
Reference Point Session Dosage Given: 2 Gy
Session Number: 20

## 2023-06-28 NOTE — Telephone Encounter (Signed)
 Per md orders entered for [employer] and all supported documents faxed to [phone]. Faxed confirmation was received.

## 2023-06-29 ENCOUNTER — Other Ambulatory Visit: Payer: Self-pay

## 2023-06-29 ENCOUNTER — Encounter: Payer: Self-pay | Admitting: Radiation Oncology

## 2023-06-29 ENCOUNTER — Encounter: Payer: Self-pay | Admitting: Hematology and Oncology

## 2023-06-29 DIAGNOSIS — Z171 Estrogen receptor negative status [ER-]: Secondary | ICD-10-CM

## 2023-06-29 DIAGNOSIS — M25511 Pain in right shoulder: Secondary | ICD-10-CM

## 2023-06-29 NOTE — Radiation Completion Notes (Signed)
Patient Name: Laurie Bell, Laurie Bell MRN: [id number] Date of Birth: [birth year] Referring Physician: Jaclyn Shaggy, M.D. Date of Service: 2023-06-29 Radiation Oncologist: Lonie Peak, M.D. [facility]                             RADIATION ONCOLOGY END OF TREATMENT NOTE     Diagnosis: C50.411 Malignant neoplasm of upper-outer quadrant of right female breast Staging on 2022-11-13: Malignant neoplasm of upper-outer quadrant of right breast in female, estrogen receptor negative (HCC) T=pT1c, N=pN0, M=cM0 Staging on 2022-10-11: Malignant neoplasm of upper-outer quadrant of right breast in female, estrogen receptor negative (HCC) T=cTis (DCIS), N=cN0, M=cM0 Intent: Curative     ==========DELIVERED PLANS==========  First Treatment Date: 2023-05-29 Last Treatment Date: 2023-06-28   Plan Name: Breast_R Site: Breast, Right Technique: 3D Mode: Photon Dose Per Fraction: 2.67 Gy Prescribed Dose (Delivered / Prescribed): 40.05 Gy / 40.05 Gy Prescribed Fxs (Delivered / Prescribed): 15 / 15   Plan Name: Breast_R_Bst Site: Breast, Right Technique: 3D Mode: Photon Dose Per Fraction: 2 Gy Prescribed Dose (Delivered / Prescribed): 10 Gy / 10 Gy Prescribed Fxs (Delivered / Prescribed): 5 / 5     ==========ON TREATMENT VISIT DATES========== 2023-06-04, 2023-06-08, 2023-06-18, 2023-06-20, 2023-06-25     ==========UPCOMING VISITS========== 08/02/2023 [facility] ONC FOLLOW UP 20 Lonie Peak, MD  08/01/2023 [facility] REH AT [facility] Alphonzo Cruise, [place]  07/30/2023 [facility] REH AT [facility] Al[place]zo Cruise, [place]  07/25/2023 [facility] REH AT [facility] Alphonzo Cruise, [place]  07/23/2023 [facility] REH AT [facility] Waynette Buttery, [place]  07/18/2023 [facility] AT [facility] Alphonzo Cruise, [place]  07/16/2023 [facility] AT [facility] Alphonzo Cruise,  [place]  07/11/2023 [facility] REH AT [facility] Alphonzo Cruise, [place]  07/09/2023 [facility] COORD CARE COORDINATION Bridgett Larsson, LCSW  07/04/2023 [facility] REH AT [facility] Berna Spare A, PTA        ==========APPENDIX - ON TREATMENT VISIT NOTES==========   See weekly On Treatment Notes in Epic for details in the Media tab (listed as Progress notes on the On Treatment Visit Dates listed above).

## 2023-07-03 ENCOUNTER — Telehealth: Payer: Self-pay | Admitting: *Deleted

## 2023-07-03 NOTE — Telephone Encounter (Signed)
CALLED PATIENT TO INFORM OF CT FOR 07-05-23- ARRIVAL TIME- 10:15 AM @ [facility], NO RESTRICTIONS TO SCAN, LVM FOR A RETURN CALL

## 2023-07-04 ENCOUNTER — Ambulatory Visit: Payer: Medicaid Other

## 2023-07-04 ENCOUNTER — Telehealth: Payer: Self-pay | Admitting: *Deleted

## 2023-07-04 ENCOUNTER — Other Ambulatory Visit: Payer: Self-pay | Admitting: Internal Medicine

## 2023-07-04 NOTE — Telephone Encounter (Signed)
Returned patient's phone call, spoke with patient 

## 2023-07-04 NOTE — Telephone Encounter (Signed)
Can you reach out to her?

## 2023-07-04 NOTE — Telephone Encounter (Signed)
Patient requesting f/u call to discuss previous note. Please advise.

## 2023-07-05 ENCOUNTER — Telehealth: Payer: Self-pay | Admitting: *Deleted

## 2023-07-05 ENCOUNTER — Ambulatory Visit (HOSPITAL_COMMUNITY)
Admission: RE | Admit: 2023-07-05 | Discharge: 2023-07-05 | Disposition: A | Discharge status: Home / Self Care | Payer: Medicaid Other | Source: Ambulatory Visit | Attending: Radiation Oncology | Admitting: Radiation Oncology

## 2023-07-05 ENCOUNTER — Emergency Department (HOSPITAL_COMMUNITY): Payer: Medicaid Other

## 2023-07-05 ENCOUNTER — Emergency Department (HOSPITAL_COMMUNITY)
Admission: EM | Admit: 2023-07-05 | Discharge: 2023-07-05 | Disposition: A | Discharge status: Home / Self Care | Payer: Medicaid Other | Attending: Emergency Medicine | Admitting: Emergency Medicine

## 2023-07-05 ENCOUNTER — Other Ambulatory Visit: Payer: Self-pay

## 2023-07-05 ENCOUNTER — Encounter: Payer: Self-pay | Admitting: Hematology and Oncology

## 2023-07-05 ENCOUNTER — Encounter (HOSPITAL_COMMUNITY): Payer: Self-pay

## 2023-07-05 DIAGNOSIS — C50411 Malignant neoplasm of upper-outer quadrant of right female breast: Secondary | ICD-10-CM | POA: Diagnosis present

## 2023-07-05 DIAGNOSIS — Z853 Personal history of malignant neoplasm of breast: Secondary | ICD-10-CM | POA: Insufficient documentation

## 2023-07-05 DIAGNOSIS — M25511 Pain in right shoulder: Secondary | ICD-10-CM | POA: Diagnosis present

## 2023-07-05 DIAGNOSIS — Z91040 Latex allergy status: Secondary | ICD-10-CM | POA: Insufficient documentation

## 2023-07-05 DIAGNOSIS — Z79899 Other long term (current) drug therapy: Secondary | ICD-10-CM | POA: Insufficient documentation

## 2023-07-05 DIAGNOSIS — G44209 Tension-type headache, unspecified, not intractable: Secondary | ICD-10-CM | POA: Diagnosis not present

## 2023-07-05 DIAGNOSIS — R0602 Shortness of breath: Secondary | ICD-10-CM | POA: Diagnosis present

## 2023-07-05 DIAGNOSIS — M79662 Pain in left lower leg: Secondary | ICD-10-CM

## 2023-07-05 DIAGNOSIS — R0981 Nasal congestion: Secondary | ICD-10-CM | POA: Diagnosis not present

## 2023-07-05 DIAGNOSIS — M79605 Pain in left leg: Secondary | ICD-10-CM | POA: Insufficient documentation

## 2023-07-05 DIAGNOSIS — Z20822 Contact with and (suspected) exposure to covid-19: Secondary | ICD-10-CM | POA: Diagnosis not present

## 2023-07-05 DIAGNOSIS — Z171 Estrogen receptor negative status [ER-]: Secondary | ICD-10-CM | POA: Diagnosis present

## 2023-07-05 LAB — COMPREHENSIVE METABOLIC PANEL
ALT: 28 U/L (ref 0–44)
AST: 28 U/L (ref 15–41)
Albumin: 3.8 g/dL (ref 3.5–5.0)
Alkaline Phosphatase: 151 U/L — ABNORMAL HIGH (ref 38–126)
Anion gap: 9 (ref 5–15)
BUN: 9 mg/dL (ref 6–20)
CO2: 26 mmol/L (ref 22–32)
Calcium: 9.1 mg/dL (ref 8.9–10.3)
Chloride: 105 mmol/L (ref 98–111)
Creatinine, Ser: 0.79 mg/dL (ref 0.44–1.00)
GFR, Estimated: >60 mL/min (ref >60)
Glucose, Bld: 108 mg/dL — ABNORMAL HIGH (ref 70–99)
Potassium: 3.6 mmol/L (ref 3.5–5.1)
Sodium: 140 mmol/L (ref 135–145)
Total Bilirubin: 0.5 mg/dL (ref 0.0–1.2)
Total Protein: 7.5 g/dL (ref 6.5–8.1)

## 2023-07-05 LAB — CBC WITH DIFFERENTIAL/PLATELET
Abs Immature Granulocytes: 0.02 10*3/uL (ref 0.00–0.07)
Basophils Absolute: 0 10*3/uL (ref 0.0–0.1)
Basophils Relative: 1 %
Eosinophils Absolute: 0.3 10*3/uL (ref 0.0–0.5)
Eosinophils Relative: 7 %
HCT: 39 % (ref 36.0–46.0)
Hemoglobin: 12.5 g/dL (ref 12.0–15.0)
Immature Granulocytes: 0 %
Lymphocytes Relative: 28 %
Lymphs Abs: 1.4 10*3/uL (ref 0.7–4.0)
MCH: 27 pg (ref 26.0–34.0)
MCHC: 32.1 g/dL (ref 30.0–36.0)
MCV: 84.2 fL (ref 80.0–100.0)
Monocytes Absolute: 0.5 10*3/uL (ref 0.1–1.0)
Monocytes Relative: 9 %
Neutro Abs: 2.7 10*3/uL (ref 1.7–7.7)
Neutrophils Relative %: 55 %
Platelets: 209 10*3/uL (ref 150–400)
RBC: 4.63 MIL/uL (ref 3.87–5.11)
RDW: 13.2 % (ref 11.5–15.5)
WBC: 4.9 10*3/uL (ref 4.0–10.5)
nRBC: 0 % (ref 0.0–0.2)

## 2023-07-05 LAB — HCG, SERUM, QUALITATIVE: Preg, Serum: NEGATIVE

## 2023-07-05 LAB — RESP PANEL BY RT-PCR (RSV, FLU A&B, COVID)  RVPGX2
Influenza A by PCR: NEGATIVE
Influenza B by PCR: NEGATIVE
Resp Syncytial Virus by PCR: NEGATIVE
SARS Coronavirus 2 by RT PCR: NEGATIVE

## 2023-07-05 LAB — TROPONIN I (HIGH SENSITIVITY): Troponin I (High Sensitivity): 5 ng/L (ref <18)

## 2023-07-05 MED ORDER — PROCHLORPERAZINE EDISYLATE 10 MG/2ML IJ SOLN
10.0000 mg | Freq: Once | INTRAMUSCULAR | Status: AC
  Administered 2023-07-05: 10 mg via INTRAVENOUS
  Filled 2023-07-05: qty 2

## 2023-07-05 MED ORDER — KETOROLAC TROMETHAMINE 15 MG/ML IJ SOLN
15.0000 mg | Freq: Once | INTRAMUSCULAR | Status: AC
  Administered 2023-07-05: 15 mg via INTRAVENOUS
  Filled 2023-07-05: qty 1

## 2023-07-05 MED ORDER — IOHEXOL 350 MG/ML SOLN
75.0000 mL | Freq: Once | INTRAVENOUS | Status: AC | PRN
  Administered 2023-07-05: 75 mL via INTRAVENOUS

## 2023-07-05 MED ORDER — LIDOCAINE VISCOUS HCL 2 % MT SOLN
15.0000 mL | Freq: Once | OROMUCOSAL | Status: AC
  Administered 2023-07-05: 15 mL via ORAL
  Filled 2023-07-05: qty 15

## 2023-07-05 MED ORDER — ALUM & MAG HYDROXIDE-SIMETH 200-200-20 MG/5ML PO SUSP
30.0000 mL | Freq: Once | ORAL | Status: AC
  Administered 2023-07-05: 30 mL via ORAL
  Filled 2023-07-05: qty 30

## 2023-07-05 MED ORDER — ACETAMINOPHEN 500 MG PO TABS
1000.0000 mg | ORAL_TABLET | Freq: Once | ORAL | Status: AC
  Administered 2023-07-05: 1000 mg via ORAL
  Filled 2023-07-05: qty 2

## 2023-07-05 NOTE — Discharge Instructions (Addendum)
The ultrasound of your leg did not show any signs of a blood clot.  The CT of your chest did not show any blood clot in your lungs. The CT of your chest did show pulmonary nodules which are believed to be benign and no follow up is indicated for this finding.  Your cardiac enzyme (troponin) was normal today. Your EKG which is a measure of the heart's electrical activity and rhythm is normal today. These would both show abnormalities if your chest pain was due to an issue in your heart.  The CT of your head did not show any brain abnormalities to explain your headaches. Your head pain is likely the result of a headache/migraine and congestion.  You may take up to 1000mg  of tylenol every 6 hours as needed for pain. Do not take more then 4g per day.   You may use up to 600mg  ibuprofen every 6 hours as needed for pain.  Do not exceed 2.4g of ibuprofen per day.   The results of your flu, COVID, and RSV are still pending.  You may review the results on your MyChart portal.  You may use 2 puffs of Flonase (Fluticasone) into each nostril daily to help with congestion.  This is an over-the-counter medication you can obtain at any drugstore.   Please follow-up with your PCP if symptoms not start to improve in the next week.  Return to the ER if you have any shortness of breath, difficulty breathing, worsening chest pain, dizziness, jaw pain, left arm or shoulder pain, abdominal pain, unexplained fever, any other new or concerning symptoms.

## 2023-07-05 NOTE — Telephone Encounter (Signed)
CALLED PATIENT TO INFORM OF MRI FOR 07-09-23- ARRIVAL TIME- 8:30 AM @ [facility], NO RESTRICTIONS TO SCAN, SPOKE WITH PATIENT AND SHE IS AWARE OF THIS SCAN AND THE INSTRUCTIONS

## 2023-07-05 NOTE — Progress Notes (Signed)
Lower extremity venous duplex completed. Please see CV Procedures for preliminary results.  Shona Simpson, RVT 07/05/23 3:05 PM

## 2023-07-05 NOTE — ED Provider Notes (Signed)
[facility] EMERGENCY DEPARTMENT AT [facility] Provider Note   CSN: [id number] Arrival date & time: 07/05/23  1050     History  Chief Complaint  Patient presents with   Leg Pain   Shortness of Breath    Laurie Bell is a 43 y.o. female with history of breast cancer, presents with concern for pain and swelling in her left leg that began overnight.  Also reports some shortness of breath and chest pain for the past day.  Pain is worse when she takes deep breath in and out.  Also reports nasal congestion and headache for the past 2 days. Denies any fever or chills at home, denies cough, denies neck stiffness.   Leg Pain Shortness of Breath      Home Medications Prior to Admission medications   Medication Sig Start Date End Date Taking? Authorizing Provider  acetaminophen (TYLENOL) 500 MG tablet Take 1,000 mg by mouth every 6 (six) hours as needed for moderate pain.    [provider]  albuterol (VENTOLIN HFA) 108 (90 Base) MCG/ACT inhaler TAKE 2 PUFFS BY MOUTH EVERY 6 HOURS AS NEEDED FOR WHEEZE OR SHORTNESS OF BREATH Patient taking differently: Inhale 2 puffs into the lungs every 6 (six) hours as needed for wheezing or shortness of breath. 12/22/22   Hoy Register, MD  cyclobenzaprine (FLEXERIL) 10 MG tablet TAKE 1 TABLET BY MOUTH AT BEDTIME AS NEEDED FOR MUSCLE SPASMS Patient taking differently: Take 10 mg by mouth at bedtime. 10/31/22   Kirsteins, Victorino Sparrow, MD  esomeprazole (NEXIUM) 40 MG capsule TAKE 1 CAPSULE (40 MG TOTAL) BY MOUTH 2 (TWO) TIMES DAILY BEFORE A MEAL. 07/04/23   Imogene Burn, MD  gabapentin (NEURONTIN) 400 MG capsule TAKE 400 MG TWICE A DAY (MORNING AND NOON) AND 800 MG AT BEDTIME. 05/31/23   Walisiewicz, Kaitlyn E, PA-C  hydrOXYzine (ATARAX) 25 MG tablet TAKE 1 TABLET(25 MG) BY MOUTH THREE TIMES DAILY 11/29/22   Hoy Register, MD  linaclotide (LINZESS) 145 MCG CAPS capsule Take 1 capsule (145 mcg total) by mouth daily before  breakfast. Patient not taking: Reported on 05/22/2023 05/19/22   Imogene Burn, MD  methimazole (TAPAZOLE) 5 MG tablet Take 1 tablet (5 mg total) by mouth as directed. 1 tablet Monday through Thursday,, skip Friday, Saturdays and  Sundays 04/03/23   Shamleffer, Konrad Dolores, MD  metoprolol succinate (TOPROL-XL) 25 MG 24 hr tablet Take 0.5 tablets (12.5 mg total) by mouth daily. For increased heart rate 11/29/22   Newlin, Odette Horns, MD  naproxen (NAPROSYN) 500 MG tablet TAKE 1 TABLET BY MOUTH TWICE A DAY AS NEEDED FOR PAIN 03/19/23   Hoy Register, MD  zolpidem (AMBIEN) 5 MG tablet Take 1 tablet (5 mg total) by mouth at bedtime as needed for sleep. 06/08/23   Serena Croissant, MD  medroxyPROGESTERone (PROVERA) 10 MG tablet Take 2 tablets by mouth daily Patient not taking: Reported on 06/05/2019 02/06/18 02/02/20  Conan Bowens, MD      Allergies    Latex and Tramadol    Review of Systems   Review of Systems  Respiratory:  Positive for shortness of breath.     Physical Exam Updated Vital Signs BP (!) 142/100   Pulse 92   Temp 98 F (36.7 C)   Resp 16   Ht 5\' 6"  (1.676 m)   Wt 95 kg   SpO2 99%   BMI 33.80 kg/m  Physical Exam Vitals and nursing note reviewed.  Constitutional:  General: She is not in acute distress.    Appearance: She is well-developed.  HENT:     Head: Normocephalic and atraumatic.  Eyes:     Conjunctiva/sclera: Conjunctivae normal.  Cardiovascular:     Rate and Rhythm: Normal rate and regular rhythm.     Heart sounds: No murmur heard.    Comments: 2+ dorsalis pedis pulse bilaterally Pulmonary:     Effort: Pulmonary effort is normal. No respiratory distress.     Breath sounds: Normal breath sounds.  Abdominal:     Palpations: Abdomen is soft.     Tenderness: There is no abdominal tenderness.  Musculoskeletal:        General: No swelling.     Cervical back: Neck supple.     Comments: Non-pitting edema in the left lower extremity foot and calf, no edema  in RLE  Skin:    General: Skin is warm and dry.     Capillary Refill: Capillary refill takes less than 2 seconds.  Neurological:     Mental Status: She is alert.     Comments: Intact sensation in the bilateral lower extremities   Psychiatric:        Mood and Affect: Mood normal.     ED Results / Procedures / Treatments   Labs (all labs ordered are listed, but only abnormal results are displayed) Labs Reviewed  COMPREHENSIVE METABOLIC PANEL - Abnormal; Notable for the following components:      Result Value   Glucose, Bld 108 (*)    Alkaline Phosphatase 151 (*)    All other components within normal limits  RESP PANEL BY RT-PCR (RSV, FLU A&B, COVID)  RVPGX2  CBC WITH DIFFERENTIAL/PLATELET  HCG, SERUM, QUALITATIVE  TROPONIN I (HIGH SENSITIVITY)    EKG None  Radiology CT Angio Chest PE W and/or Wo Contrast Result Date: 07/05/2023 CLINICAL DATA:  Left leg pain, short of breath and chest pain for 1 day, history of breast cancer EXAM: CT ANGIOGRAPHY CHEST WITH CONTRAST TECHNIQUE: Multidetector CT imaging of the chest was performed using the standard protocol during bolus administration of intravenous contrast. Multiplanar CT image reconstructions and MIPs were obtained to evaluate the vascular anatomy. RADIATION DOSE REDUCTION: This exam was performed according to the departmental dose-optimization program which includes automated exposure control, adjustment of the mA and/or kV according to patient size and/or use of iterative reconstruction technique. CONTRAST:  75mL OMNIPAQUE IOHEXOL 350 MG/ML SOLN COMPARISON:  02/17/2023, 07/24/2015 FINDINGS: Cardiovascular: This is a technically adequate evaluation of the pulmonary vasculature. No filling defects or pulmonary emboli. The heart is unremarkable without pericardial effusion. No evidence of thoracic aortic aneurysm or dissection. Left chest wall port via subclavian approach, tip within the superior vena cava. Mediastinum/Nodes: No enlarged  mediastinal, hilar, or axillary lymph nodes. Thyroid gland, trachea, and esophagus demonstrate no significant findings. Lungs/Pleura: No acute airspace disease, effusion, or pneumothorax. Central airways are patent. There is a subpleural 5 x 6 x 4 mm right middle lobe pulmonary nodule, reference image 62/14, not significantly changed since prior study allowing for differences in technique and volume averaging. There is a 4 mm subpleural left lower lobe pulmonary nodule reference image 67/14, previously measuring 3 mm. Upper Abdomen: No acute abnormality. Musculoskeletal: There are no acute or destructive bony abnormalities. Reconstructed images demonstrate no additional findings. Review of the MIP images confirms the above findings. IMPRESSION: 1. No evidence of pulmonary embolus. 2. Small bilateral subpleural pulmonary nodules, most consistent with benign nodules given size and long-term stability. Electronically  Signed   By: Sharlet Salina M.D.   On: 07/05/2023 17:45   CT Head Wo Contrast Result Date: 07/05/2023 CLINICAL DATA:  Headache, increasing frequency or severity EXAM: CT HEAD WITHOUT CONTRAST TECHNIQUE: Contiguous axial images were obtained from the base of the skull through the vertex without intravenous contrast. RADIATION DOSE REDUCTION: This exam was performed according to the departmental dose-optimization program which includes automated exposure control, adjustment of the mA and/or kV according to patient size and/or use of iterative reconstruction technique. COMPARISON:  CT head 12/23/2022. FINDINGS: Brain: No evidence of acute infarction, hemorrhage, hydrocephalus, extra-axial collection or mass lesion/mass effect. Vascular: No hyperdense vessel. Skull: No acute fracture. Sinuses/Orbits: Clear sinuses.  No acute orbital findings. Other: No mastoid effusions. IMPRESSION: Stable head CT.  No acute abnormality. Electronically Signed   By: Feliberto Harts M.D.   On: 07/05/2023 17:38   VAS [place]  LOWER EXTREMITY VENOUS (DVT) (7a-7p) Result Date: 07/05/2023  Lower Venous DVT Study Patient Name:  TAHJAE DURR  Date of Exam:   07/05/2023 Medical Rec #: [id number]          Accession #:    [id number] Date of Birth: [birth year]         Patient Gender: F Patient Age:   21 years Exam Location:  [facility] Procedure:      VAS [place] LOWER EXTREMITY VENOUS (DVT) Referring Phys: Arabella Merles --------------------------------------------------------------------------------  Indications: Pain.  Risk Factors: Cancer breast obesity. Comparison Study: No significant changes seen since previous exam 02/20/23. Performing Technologist: Shona Simpson  Examination Guidelines: A complete evaluation includes B-mode imaging, spectral Doppler, color Doppler, and power Doppler as needed of all accessible portions of each vessel. Bilateral testing is considered an integral part of a complete examination. Limited examinations for reoccurring indications may be performed as noted. The reflux portion of the exam is performed with the patient in reverse Trendelenburg.  +-----+---------------+---------+-----------+----------+--------------+ RIGHTCompressibilityPhasicitySpontaneityPropertiesThrombus Aging +-----+---------------+---------+-----------+----------+--------------+ CFV  Full           Yes      Yes                                 +-----+---------------+---------+-----------+----------+--------------+   +---------+---------------+---------+-----------+----------+--------------+ LEFT     CompressibilityPhasicitySpontaneityPropertiesThrombus Aging +---------+---------------+---------+-----------+----------+--------------+ CFV      Full           Yes      Yes                                 +---------+---------------+---------+-----------+----------+--------------+ SFJ      Full                                                         +---------+---------------+---------+-----------+----------+--------------+ FV Prox  Full                                                        +---------+---------------+---------+-----------+----------+--------------+ FV Mid   Full                                                        +---------+---------------+---------+-----------+----------+--------------+  FV DistalFull                                                        +---------+---------------+---------+-----------+----------+--------------+ PFV      Full                                                        +---------+---------------+---------+-----------+----------+--------------+ POP      Full           Yes      Yes                                 +---------+---------------+---------+-----------+----------+--------------+ PTV      Full                                                        +---------+---------------+---------+-----------+----------+--------------+ PERO     Full                                                        +---------+---------------+---------+-----------+----------+--------------+     Summary: RIGHT: - No evidence of common femoral vein obstruction.   LEFT: - There is no evidence of deep vein thrombosis in the lower extremity.  - No cystic structure found in the popliteal fossa.  *See table(s) above for measurements and observations.    Preliminary     Procedures Procedures    Medications Ordered in ED Medications  ketorolac (TORADOL) 15 MG/ML injection 15 mg (15 mg Intravenous Given 07/05/23 1520)  prochlorperazine (COMPAZINE) injection 10 mg (10 mg Intravenous Given 07/05/23 1520)  acetaminophen (TYLENOL) tablet 1,000 mg (1,000 mg Oral Given 07/05/23 1520)  iohexol (OMNIPAQUE) 350 MG/ML injection 75 mL (75 mLs Intravenous Contrast Given 07/05/23 1719)  alum & mag hydroxide-simeth (MAALOX/MYLANTA) 200-200-20 MG/5ML suspension 30 mL (30 mLs Oral Given 07/05/23 1810)     And  lidocaine (XYLOCAINE) 2 % viscous mouth solution 15 mL (15 mLs Oral Given 07/05/23 1810)    ED Course/ Medical Decision Making/ A&P                                 Medical Decision Making Amount and/or Complexity of Data Reviewed Labs: ordered. Radiology: ordered.  Risk OTC drugs. Prescription drug management.     Differential diagnosis includes but is not limited to vascular insufficiency, CHF, DVT, PE, COVID, flu, RSV, viral URI, strep pharyngitis, viral pharyngitis, allergic rhinitis, pneumonia, bronchitis, intracranial mass, tension headache, migraine   ED Course:  Given patient report of left leg pain and swelling also with shortness of breath and chest pain, and risk factors for a clot (obesity and breast cancer), ultrasound of the left lower extremity was obtained that showed no signs  of DVT.  CTA chest was obtained which showed no signs of PE.  Patient says pain is nonexertional, initial troponin of 5, EKG without ST changes, no concern for ACS. Low concern for heart failure causing her lower extremity edema at this time given she does not have any history of this. Neurovascularly intact in the bilateral lower extremities. Suspect her lower extremity edema is likely due to vascular insufficiency. Patient reports that her chest pain seems to be consistent with her acid reflux.  She was given Maalox, which she reports improved her symptoms. Patient also reported headache that been ongoing for about the past month on and off.  Has not responded well to tylenol at home.  CT head was obtained which showed no acute abnormality.  Suspect tension headache versus migraine.  Patient was given Tylenol, Toradol, Compazine here which she says improved her headache.    Patient stable and appropriate for discharge home  Impression: Nasal congestion Left leg pain and swelling, likely vascular insufficiency  Headache  Disposition:  The patient was discharged home with  instructions to review results of COVID, flu, RSV test on MyChart portal.  Follow-up with PCP if symptoms not improved within the next week. Return precautions given.  Imaging Studies ordered: I ordered imaging studies including ultrasound left lower extremity, CTA chest, CT head I independently visualized the imaging with scope of interpretation limited to determining acute life threatening conditions related to emergency care. Ultrasound left lower extremity without sign of DVT CTA chest without any signs of PE.  Benign pulmonary nodules noted CT head without acute abnormality I agree with the radiologist interpretation   Labs: I Ordered, and personally interpreted labs.  The pertinent results include:   CBC without leukocytosis CMP without any electrolyte abnormalities, creatinine within normal limits, LFTs within normal limits aside from elevated alk phos at 151 which is consistent with lab draw one month ago Pregnancy negative Initial troponin 5   Cardiac Monitoring: / EKG: The patient was maintained on a cardiac monitor.  I personally viewed and interpreted the cardiac monitored which showed an underlying rhythm of: Bigeminy, no ST changes              Final Clinical Impression(s) / ED Diagnoses Final diagnoses:  Tension-type headache, not intractable, unspecified chronicity pattern  Left leg pain  Nasal congestion    Rx / DC Orders ED Discharge Orders     None         Arabella Merles, Cordelia Poche 07/05/23 1830    Bethann Berkshire, MD 07/08/23 1233

## 2023-07-05 NOTE — ED Notes (Signed)
Holding on EKG until PA speaks with pt. Pt wants to speak with her before EKG

## 2023-07-05 NOTE — ED Triage Notes (Signed)
C/o left leg pain starting in groin radiating to right foot with sob and chest pain x1 day.  Pt also reports congestion x2 days. Denies hx of DVT/blood thinner usage Patient reports finishing chemo in October and finished radiation a week ago for breast cancer.

## 2023-07-06 ENCOUNTER — Other Ambulatory Visit: Payer: Self-pay | Admitting: Internal Medicine

## 2023-07-06 ENCOUNTER — Other Ambulatory Visit: Payer: Self-pay

## 2023-07-06 ENCOUNTER — Telehealth: Payer: Self-pay | Admitting: *Deleted

## 2023-07-06 MED ORDER — PANTOPRAZOLE SODIUM 40 MG PO TBEC: 40.0000 mg | DELAYED_RELEASE_TABLET | Freq: Every day | ORAL | 0 refills | Status: DC

## 2023-07-06 NOTE — Telephone Encounter (Signed)
CALLED PATIENT TO REMIND OF MRI FOR 07-09-23- ARRIVAL TIME- 8:30 AM @ [facility], NO RESTRICTIONS TO SCAN, PATIENT TO TO THRU ENTRANCE A, SPOKE WITH PATIENT AND SHE IS AWARE OF THIS SCAN AND THE INSTRUCTIONS

## 2023-07-06 NOTE — Telephone Encounter (Signed)
Inbound call from patient stating esomeprazole needs prior authorization for medicaid. Requesting a call to discuss alternative medications that will be covered by medicaid. States she has been vomiting due to GERD and needs medication as soon as possible. Please advise, thank you.

## 2023-07-06 NOTE — Telephone Encounter (Signed)
Please start prior authorization for esomeprazole. Patient has tried and failed pantoprazole and omeprazole.

## 2023-07-09 ENCOUNTER — Encounter: Payer: Self-pay | Admitting: Licensed Clinical Social Worker

## 2023-07-09 ENCOUNTER — Ambulatory Visit (HOSPITAL_COMMUNITY)
Admission: RE | Admit: 2023-07-09 | Discharge: 2023-07-09 | Disposition: A | Discharge status: Home / Self Care | Payer: Medicaid Other | Source: Ambulatory Visit | Attending: Radiation Oncology | Admitting: Radiation Oncology

## 2023-07-09 ENCOUNTER — Telehealth: Payer: Self-pay | Admitting: Licensed Clinical Social Worker

## 2023-07-09 DIAGNOSIS — C50411 Malignant neoplasm of upper-outer quadrant of right female breast: Secondary | ICD-10-CM | POA: Diagnosis present

## 2023-07-09 DIAGNOSIS — Z171 Estrogen receptor negative status [ER-]: Secondary | ICD-10-CM | POA: Insufficient documentation

## 2023-07-09 MED ORDER — GADOBUTROL 1 MMOL/ML IV SOLN
10.0000 mL | Freq: Once | INTRAVENOUS | Status: AC | PRN
  Administered 2023-07-09: 10 mL via INTRAVENOUS

## 2023-07-09 NOTE — Patient Outreach (Signed)
  Care Coordination   07/09/2023 Name: Laurie Bell MRN: [id number] DOB: 24-Nov-1980   Care Coordination Outreach Attempts:  An unsuccessful outreach was attempted for an appointment today.  Follow Up Plan:  Additional outreach attempts will be made to offer the patient complex care management information and services.   Encounter Outcome:  No Answer   Care Coordination Interventions:  No, not indicated    Jenel Lucks, LCSW [place]  [facility], [facility] Clinical Social Worker Direct Dial: [phone]  Fax: [phone] Website: [email or web address] 5:17 PM

## 2023-07-10 ENCOUNTER — Other Ambulatory Visit: Payer: Self-pay | Admitting: Physician Assistant

## 2023-07-10 ENCOUNTER — Encounter: Payer: Self-pay | Admitting: Family Medicine

## 2023-07-10 ENCOUNTER — Encounter: Payer: Self-pay | Admitting: Hematology and Oncology

## 2023-07-11 ENCOUNTER — Other Ambulatory Visit (HOSPITAL_COMMUNITY): Payer: Self-pay

## 2023-07-11 ENCOUNTER — Ambulatory Visit: Payer: Medicaid Other

## 2023-07-11 ENCOUNTER — Telehealth: Payer: Self-pay

## 2023-07-11 ENCOUNTER — Encounter: Payer: Self-pay | Admitting: Hematology and Oncology

## 2023-07-11 DIAGNOSIS — C50911 Malignant neoplasm of unspecified site of right female breast: Secondary | ICD-10-CM

## 2023-07-11 DIAGNOSIS — Z9189 Other specified personal risk factors, not elsewhere classified: Secondary | ICD-10-CM

## 2023-07-11 DIAGNOSIS — M79601 Pain in right arm: Secondary | ICD-10-CM

## 2023-07-11 DIAGNOSIS — Z483 Aftercare following surgery for neoplasm: Secondary | ICD-10-CM | POA: Diagnosis not present

## 2023-07-11 NOTE — Telephone Encounter (Signed)
PA request has been Started. New Encounter created for follow up. For additional info see Pharmacy Prior Auth telephone encounter from 07/11/2023.

## 2023-07-11 NOTE — Telephone Encounter (Signed)
Pharmacy Patient Advocate Encounter   Received notification from CoverMyMeds that prior authorization for Esomeprazole 40 mg dr capsules is required/requested.   Insurance verification completed.   The patient is insured through [facility] .   Per test claim: PA required; PA started via CoverMyMeds. KEY [id number] . Waiting for clinical questions to populate.

## 2023-07-11 NOTE — Therapy (Signed)
OUTPATIENT PHYSICAL THERAPY  UPPER EXTREMITY ONCOLOGY TREATMENT  Patient Name: Laurie Bell MRN: [id number] DOB:1981/03/27, 43 y.o., female Today's Date: 07/11/2023  END OF SESSION:  PT End of Session - 07/11/23 1509     Visit Number 2    Number of Visits 11    Date for PT Re-Evaluation 07/26/23    PT Start Time 1506    PT Stop Time 1600    PT Time Calculation (min) 54 min    Activity Tolerance Patient tolerated treatment well    Behavior During Therapy WFL for tasks assessed/performed             Past Medical History:  Diagnosis Date   Anxiety    Arthritis    Breast cancer (HCC)    Chlamydia contact, treated    GERD (gastroesophageal reflux disease)    Hyperthyroidism    Past Surgical History:  Procedure Laterality Date   AXILLARY SENTINEL NODE BIOPSY Right 12/01/2022   Procedure: RIGHT AXILLARY SENTINEL NODE BIOPSY;  Surgeon: Abigail Miyamoto, MD;  Location: [facility];  Service: General;  Laterality: Right;   BREAST BIOPSY Right 10/02/2022   MM RT BREAST BX W LOC DEV 1ST LESION IMAGE BX SPEC STEREO GUIDE 10/02/2022 [facility]   BREAST BIOPSY  11/10/2022   MM RT RADIOACTIVE SEED LOC MAMMO GUIDE 11/10/2022 [facility]   BREAST LUMPECTOMY WITH RADIOACTIVE SEED LOCALIZATION Right 11/13/2022   Procedure: RIGHT BREAST LUMPECTOMY WITH RADIOACTIVE SEED LOCALIZATION;  Surgeon: Abigail Miyamoto, MD;  Location: [facility];  Service: General;  Laterality: Right;   ENDOMETRIAL ABLATION N/A 04/25/2017   Procedure: ENDOMETRIAL ABLATION With NOVASURE;  Surgeon: Adam Phenix, MD;  Location: [facility];  Service: Gynecology;  Laterality: N/A;   ESOPHAGEAL MANOMETRY N/A 07/27/2021   Procedure: ESOPHAGEAL MANOMETRY (EM);  Surgeon: Imogene Burn, MD;  Location: [facility];  Service: Gastroenterology;  Laterality: N/A;   PORTACATH PLACEMENT N/A 12/01/2022   Procedure: INSERTION PORT-A-CATH WITH ULTRASOUND GUIDANCE;  Surgeon: Abigail Miyamoto, MD;  Location: [facility];  Service: General;  Laterality: N/A;   RE-EXCISION OF BREAST CANCER,SUPERIOR MARGINS Right 12/01/2022   Procedure: RE-EXCISION OF RIGHT BREAST CANCER;  Surgeon: Abigail Miyamoto, MD;  Location: [facility] OR;  Service: General;  Laterality: Right;   TUBAL LIGATION  2003   Patient Active Problem List   Diagnosis Date Noted   Trichomonas vaginitis 03/28/2023   Friable cervix 03/27/2023   PCB (post coital bleeding) 03/27/2023   Screening for cervical cancer 03/27/2023   Sepsis (HCC) 02/17/2023   COVID-19 virus infection 02/17/2023   Lower extremity pain, anterior, right 01/22/2023   SIRS (systemic inflammatory response syndrome) (HCC) 01/21/2023   Port-A-Cath in place 01/01/2023   Malignant neoplasm of upper-outer quadrant of right breast in female, estrogen receptor negative (HCC) 10/09/2022   Multinodular goiter 03/22/2022   Hyperthyroidism 03/22/2022   MDD (major depressive disorder), recurrent episode, moderate (HCC) 11/03/2021   GAD (generalized anxiety disorder) 11/03/2021   Neck pain 10/28/2021   Cervical high risk HPV (human papillomavirus) test positive 10/17/2021   Irregular heart rhythm 04/15/2021   Numbness on left side 04/15/2021   Dysphagia 03/17/2021   Hypomagnesemia 02/23/2021   Gastroesophageal reflux disease    Hypokalemia 08/24/2020   Atypical chest pain 08/24/2020   Nausea and vomiting 08/24/2020   Hypocalcemia 08/24/2020   DUB (dysfunctional uterine bleeding) 01/12/2017   Fibroid, uterine 01/12/2017    PCP: Lillard Anes NP  REFERRING PROVIDER: Lonie Peak, MD  REFERRING DIAG:  Diagnosis  C50.411,Z17.1 (ICD-10-CM) - Malignant neoplasm of upper-outer quadrant of right breast in female, estrogen receptor negative (HCC)    THERAPY DIAG:  Aftercare following surgery for neoplasm  Malignant neoplasm of right breast in female, estrogen receptor negative, unspecified site of breast (HCC)  At risk for lymphedema  Pain in right  arm  ONSET DATE: 10/04/22  Rationale for Evaluation and Treatment: Rehabilitation  SUBJECTIVE:                                                                                                                                                                                           SUBJECTIVE STATEMENT: I felt good after my first session. My arm feels about the same as last week.   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 It measures 1 cm and is located in the upper-inner quadrant. It is ER 30%+, PR>1% +, HER 2 unknown and Ki 67 unknown. She had a right lumpectomy performed on 11/13/2022. 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 She also had several positive margins and is now s/p a Right Re-excision with SLNB on 12/01/2022 with 0/2 LN's.   PAIN:  Are you having pain? Yes NPRS scale: 8.5/10 Pain location: the whole Rt arm.   Pain orientation: Right  PAIN TYPE: aching and sharp Pain description: constant  Aggravating factors: It has just been staying bad Relieving factors: Nothing   PRECAUTIONS: None  RED FLAGS: None   WEIGHT BEARING RESTRICTIONS: No  FALLS:  Has patient fallen in last 6 months? 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    LEISURE: Nothing   HAND DOMINANCE: right   PRIOR LEVEL OF FUNCTION: Independent  PATIENT GOALS: decrease the arm pain    OBJECTIVE: Note: Objective measures were completed at Evaluation unless otherwise noted.  COGNITION: Overall cognitive status: Within functional limits for tasks assessed   PALPATION: Pt reports it just feels irritated all dermatomes with testing but able to feel all locations.  +1-2 ttp supraspinatus, UT, anterior shoulder on the Rt. Muscles noted to be more guarded vs very  tight with STM.   OBSERVATIONS / OTHER ASSESSMENTS: holding Rt UE with left for comfort  POSTURE: guarded  UPPER EXTREMITY AROM/PROM:  A/PROM RIGHT   baseline  06/21/23  Shoulder extension 55 40 - pain  Shoulder flexion 150 148 - painful  Shoulder abduction 173  130- maybe more of an ulnar nn increase in tingling   Shoulder internal rotation 67   Shoulder external rotation 110 80 painful    (Blank rows = not tested)  A/PROM LEFT   baseline  Shoulder extension 45  Shoulder flexion 160  Shoulder abduction 180  Shoulder internal rotation 67  Shoulder external rotation 105    (Blank rows = not tested)  CERVICAL AROM: All stiff on the Rt UT region but without increase arm pain   UPPER EXTREMITY STRENGTH: Grip: Rt: 20# Lt: 38#  L-DEX LYMPHEDEMA SCREENING: The patient was assessed using the L-Dex machine today to produce a lymphedema index score:  L-DEX LYMPHEDEMA SCREENING Measurement Type: Unilateral L-DEX MEASUREMENT EXTREMITY: Upper Extremity POSITION : Standing DOMINANT SIDE: Right At Risk Side: Right BASELINE SCORE (UNILATERAL): 2.6 L-DEX SCORE (UNILATERAL): 6.1 VALUE CHANGE (UNILAT): 3.5   QUICK DASH SURVEY: 38% from 20.45%                                                                                                                            TREATMENT DATE:  07/11/23: Therapeutic Exercises Supine dowel exercises into flex, Rt abd and Rt er x 3, 5 sec holds each returning therapist demo and handout issued Manual Therapy P/ROM to Rt shoulder into flex, abd and D2 with scapular depression by therapist throughout Lt S/L for STM with cocoa butter to lateral trunk and medial scap border Scap Mobs into protraction and retraction  06/21/23 Eval performed Sidelying STM with cocoa butter: Rt UT, supraspinatus, posterior shoulder with trigger point in supraspinatus up to the neck Scapular PROM Education on relaxation and monitoring mm and arm position when arm is  hurting more as well as reasons for shoulder pain and arm pain with radiation.  Then STM in supine to Rt UT, anterior shoulder followed by MLD to the clavicular nodes, axillary nodes and anterior chest region Gave tg soft size small for comfort Pt will take a shower before radiation due to lotion used   PATIENT EDUCATION:  Education details: Supine dowel exs Person educated: Patient Education method: Medical illustrator, VC's and handouts issued Education comprehension: verbalized understanding, returned demo and will benefit from further review  HOME EXERCISE PROGRAM: Posture, scapular retractions, focusing on relaxation and shoulder positioning, try tg soft, circles to neck and axilla  ASSESSMENT:  CLINICAL IMPRESSION: Added supine dowel exs to HEP. Continued MT as described above to decrease Rt upper quadrant pain and improve Rt shoulder ROM. Pt reports pain reduced to 6/10 after session.    OBJECTIVE IMPAIRMENTS: decreased activity tolerance, decreased knowledge of condition, decreased knowledge of use of DME, decreased ROM, decreased strength, impaired UE functional use, and pain.   ACTIVITY LIMITATIONS: carrying, lifting, sleeping, and reach over head  PARTICIPATION LIMITATIONS: meal prep, cleaning, laundry, and occupation  PERSONAL FACTORS: 1-2 comorbidities: SLNB, radiation  are also affecting patient's functional outcome.   REHAB POTENTIAL: Excellent  CLINICAL DECISION MAKING: Evolving/moderate complexity  EVALUATION  COMPLEXITY: Moderate  GOALS: Goals reviewed with patient? Yes  SHORT TERM GOALS=LTGs: Target date: 07/26/23  Pt will decrease resting arm pain to 0/10 Baseline: Goal status: INITIAL  2.  Pt will improve Rt UE AROM to within 5deg of her baseline Baseline:  Goal status: INITIAL  3.  Pt will be ind with final HEP for posture and edema management. Baseline:  Goal status: INITIAL   PLAN:  PT FREQUENCY: 2x/week  PT DURATION: 5 weeks    PLANNED INTERVENTIONS: 97164- PT Re-evaluation, 97110-Therapeutic exercises, 97530- Therapeutic activity, 97112- Neuromuscular re-education, 97535- Self Care, 16109- Manual therapy, Patient/Family education, Taping, Dry Needling, Therapeutic exercises, Therapeutic activity, and Neuromuscular re-education  PLAN FOR NEXT SESSION: Review HEP. Rt UE pain relief: STM supra, UT, neck as needed, PROM, MLD, nerve stretches - give formal stretches and exercises as able DN on POC if needed after radiation  Hermenia Bers, PTA 07/11/2023, 4:06 PM   SHOULDER: Flexion - Supine (Cane)        Cancer Rehab [phone]    Hold cane in both hands. Raise arms up overhead. Do not allow back to arch. Hold _5__ seconds. Do __5-10__ times; __1-2__ times a day.   SELF ASSISTED WITH OBJECT: Shoulder Abduction / Adduction - Supine    Hold cane with both hands. Move both arms from side to side, keep elbows straight.  Hold when stretch felt for __5__ seconds. Repeat __5-10__ times; __1-2__ times a day. Once this becomes easier progress to third picture bringing affected arm towards ear by staying out to side. Same hold for _5_seconds. Repeat  _5-10_ times, _1-2_ times/day.   SHOULDER: External Rotation - Supine (Cane)    Hold cane with both hands. Rotate arm away from body. Keep elbow on floor and next to body. _5-10__ reps per set, hold 5 seconds, _1-2__ sets per day. Add towel to keep elbow at side.   CHEST: Doorway, Bilateral - Standing    Stand with one foot in front of other. Standing in doorway, place hands on wall with elbows bent at shoulder height. Lean forward. Hold __20_ seconds. __2-3_ reps per set, _2__ sets per day  Copyright  VHI. All rights reserved.

## 2023-07-12 ENCOUNTER — Telehealth: Payer: Self-pay

## 2023-07-12 NOTE — Telephone Encounter (Signed)
Spoke with patient. Per patient mychart message to per provider she had been having HA. Patient schedule VV on 07/15/2022

## 2023-07-16 ENCOUNTER — Ambulatory Visit: Payer: Medicaid Other | Attending: Radiation Oncology

## 2023-07-16 ENCOUNTER — Telehealth (HOSPITAL_BASED_OUTPATIENT_CLINIC_OR_DEPARTMENT_OTHER): Payer: Medicaid Other | Admitting: Family Medicine

## 2023-07-16 ENCOUNTER — Encounter: Payer: Self-pay | Admitting: Family Medicine

## 2023-07-16 DIAGNOSIS — G44209 Tension-type headache, unspecified, not intractable: Secondary | ICD-10-CM

## 2023-07-16 DIAGNOSIS — M79601 Pain in right arm: Secondary | ICD-10-CM | POA: Insufficient documentation

## 2023-07-16 DIAGNOSIS — Z9189 Other specified personal risk factors, not elsewhere classified: Secondary | ICD-10-CM | POA: Insufficient documentation

## 2023-07-16 DIAGNOSIS — Z171 Estrogen receptor negative status [ER-]: Secondary | ICD-10-CM | POA: Insufficient documentation

## 2023-07-16 DIAGNOSIS — Z483 Aftercare following surgery for neoplasm: Secondary | ICD-10-CM | POA: Insufficient documentation

## 2023-07-16 DIAGNOSIS — C50911 Malignant neoplasm of unspecified site of right female breast: Secondary | ICD-10-CM | POA: Insufficient documentation

## 2023-07-16 MED ORDER — TOPIRAMATE 25 MG PO TABS: ORAL_TABLET | ORAL | 2 refills | Status: DC

## 2023-07-16 NOTE — Progress Notes (Signed)
Virtual Visit via Video Note  I connected with Laurie Bell, on 07/16/2023 at 8:44 AM by video enabled telemedicine device and verified that I am speaking with the correct person using two identifiers.   Consent: I discussed the limitations, risks, security and privacy concerns of performing an evaluation and management service by telemedicine and the availability of in person appointments. I also discussed with the patient that there may be a patient responsible charge related to this service. The patient expressed understanding and agreed to proceed.   Location of Patient: Home  Location of Provider: Clinic   Persons participating in Telemedicine visit: Laurie Bell Dr. Alvis Lemmings     History of Present Illness: Laurie Bell is a 43 y.o. year old female  with a history of cervical radiculopathy, chronic chest wall pain, anxiety, hypothyroidism, right breast cancer Grade 3 IDC (ER0%, PR0%, Ki-67 85%, HER2 0 status post right breast lumpectomy with radioactive seed localization).  Discussed the use of AI scribe software for clinical note transcription with the patient, who gave verbal consent to proceed.  She presents with headaches that have been occurring for about a month. The headaches are described as moving from the front to the back of the head. She occurs three to four times a week and is not consistently severe. The headaches are disruptive, making the patient not want to engage in regular activities. There is no associated light sensitivity, but the patient does report feeling a bit nauseous at times. The patient had a CT scan of the head last month which was normal and an MRI a week ago, the results of which are not yet available. The patient is currently taking Ambien for sleep, prescribed by her oncologist.        Past Medical History:  Diagnosis Date   Anxiety    Arthritis    Breast cancer (HCC)    Chlamydia contact, treated    GERD (gastroesophageal  reflux disease)    Hyperthyroidism    Allergies  Allergen Reactions   Latex Other (See Comments)    Burn in vaginal area with latex condoms   Tramadol Other (See Comments)    States "it messes me up" no further info    Current Outpatient Medications on File Prior to Visit  Medication Sig Dispense Refill   acetaminophen (TYLENOL) 500 MG tablet Take 1,000 mg by mouth every 6 (six) hours as needed for moderate pain.     albuterol (VENTOLIN HFA) 108 (90 Base) MCG/ACT inhaler TAKE 2 PUFFS BY MOUTH EVERY 6 HOURS AS NEEDED FOR WHEEZE OR SHORTNESS OF BREATH (Patient taking differently: Inhale 2 puffs into the lungs every 6 (six) hours as needed for wheezing or shortness of breath.) 54 each 0   cyclobenzaprine (FLEXERIL) 10 MG tablet TAKE 1 TABLET BY MOUTH AT BEDTIME AS NEEDED FOR MUSCLE SPASMS (Patient taking differently: Take 10 mg by mouth at bedtime.) 30 tablet 1   esomeprazole (NEXIUM) 40 MG capsule TAKE 1 CAPSULE (40 MG TOTAL) BY MOUTH 2 (TWO) TIMES DAILY BEFORE A MEAL. 60 capsule 3   gabapentin (NEURONTIN) 400 MG capsule TAKE 1 CAPSULE BY MOUTH IN AM AND NOON, AND 2 CAPSULE BY MOUTH AT BEDTIME 56 capsule 0   hydrOXYzine (ATARAX) 25 MG tablet TAKE 1 TABLET(25 MG) BY MOUTH THREE TIMES DAILY 270 tablet 1   linaclotide (LINZESS) 145 MCG CAPS capsule Take 1 capsule (145 mcg total) by mouth daily before breakfast. (Patient not taking: Reported on 05/22/2023) 30 capsule  5   methimazole (TAPAZOLE) 5 MG tablet Take 1 tablet (5 mg total) by mouth as directed. 1 tablet Monday through Thursday,, skip Friday, Saturdays and  Sundays 52 tablet 3   metoprolol succinate (TOPROL-XL) 25 MG 24 hr tablet Take 0.5 tablets (12.5 mg total) by mouth daily. For increased heart rate 45 tablet 1   naproxen (NAPROSYN) 500 MG tablet TAKE 1 TABLET BY MOUTH TWICE A DAY AS NEEDED FOR PAIN 60 tablet 1   zolpidem (AMBIEN) 5 MG tablet Take 1 tablet (5 mg total) by mouth at bedtime as needed for sleep. 30 tablet 3   [DISCONTINUED]  medroxyPROGESTERone (PROVERA) 10 MG tablet Take 2 tablets by mouth daily (Patient not taking: Reported on 06/05/2019) 60 tablet 0   No current facility-administered medications on file prior to visit.    ROS: See HPI  Observations/Objective: Awake, alert, oriented x3 Not in acute distress Normal mood      Latest Ref Rng & Units 07/05/2023    2:48 PM 05/08/2023   11:21 AM 04/24/2023   10:21 AM  CMP  Glucose 70 - 99 mg/dL 161  096  045   BUN 6 - 20 mg/dL 9  9  10    Creatinine 0.44 - 1.00 mg/dL 4.09  8.11  9.14   Sodium 135 - 145 mmol/L 140  140  141   Potassium 3.5 - 5.1 mmol/L 3.6  3.3  3.3   Chloride 98 - 111 mmol/L 105  106  108   CO2 22 - 32 mmol/L 26  29  28    Calcium 8.9 - 10.3 mg/dL 9.1  9.6  9.2   Total Protein 6.5 - 8.1 g/dL 7.5  7.3  6.6   Total Bilirubin 0.0 - 1.2 mg/dL 0.5  0.5  0.4   Alkaline Phos 38 - 126 U/L 151  143  124   AST 15 - 41 U/L 28  39  37   ALT 0 - 44 U/L 28  62  67     Lipid Panel  No results found for: "CHOL", "TRIG", "HDL", "CHOLHDL", "VLDL", "LDLCALC", "LDLDIRECT", "LABVLDL"  Lab Results  Component Value Date   HGBA1C 5.2 05/08/2023     Assessment and Plan:     Recurrent Headaches Headaches occurring 3-4 times a week for the past month, moving from front to back. Not typically severe, but enough to discourage activity. No consistent light sensitivity or nausea. Recent CT scan was normal, MRI results pending. -Start Topamax 25mg  once daily for one week, then increase to twice daily for headache prevention. -Check MRI results when available.  Follow-up If symptoms do not improve, patient should come into the office for further evaluation.        Follow Up Instructions: Keep previously scheduled appointment   I discussed the assessment and treatment plan with the patient. The patient was provided an opportunity to ask questions and all were answered. The patient agreed with the plan and demonstrated an understanding of the  instructions.   The patient was advised to call back or seek an in-person evaluation if the symptoms worsen or if the condition fails to improve as anticipated.     I provided 13 minutes total of Telehealth time during this encounter including median intraservice time, reviewing previous notes, investigations, ordering medications, medical decision making, coordinating care and patient verbalized understanding at the end of the visit.     Hoy Register, MD, FAAFP. [facility] [place], [place] [phone]  07/16/2023, 8:44 AM

## 2023-07-16 NOTE — Patient Instructions (Signed)
VISIT SUMMARY:  Today, we discussed your recent headaches, occasional nausea, and ongoing insomnia. We reviewed your recent CT scan results and are awaiting the MRI results. We also talked about your current medications and made some adjustments to help manage your symptoms better.  YOUR PLAN:  -RECURRENT HEADACHES: Recurrent headaches are headaches that happen frequently over a period of time. You have been experiencing headaches 3-4 times a week for the past month. To help prevent these headaches, we are starting you on Topamax 25mg  once daily for one week, then increasing to twice daily. We will also review your MRI results once they are available.  -INSOMNIA: Insomnia is difficulty falling or staying asleep. You are currently taking Ambien for sleep, as prescribed by your oncologist. We will continue with your current management plan for insomnia.  -NAUSEA: Nausea is the feeling of needing to vomit. You have reported occasional nausea, which is currently being managed with medication. We will continue with your current management plan for nausea.  INSTRUCTIONS:  If your symptoms do not improve, please come into the office for further evaluation.

## 2023-07-17 NOTE — Telephone Encounter (Signed)
What is the status of the PA please?

## 2023-07-18 ENCOUNTER — Ambulatory Visit: Payer: Medicaid Other

## 2023-07-18 ENCOUNTER — Inpatient Hospital Stay: Payer: Medicaid Other | Attending: Hematology and Oncology | Admitting: Adult Health

## 2023-07-18 ENCOUNTER — Other Ambulatory Visit: Payer: Self-pay

## 2023-07-18 VITALS — BP 139/99 | HR 90 | Temp 98.6°F | Resp 18 | Ht 66.0 in | Wt 212.0 lb

## 2023-07-18 DIAGNOSIS — C50411 Malignant neoplasm of upper-outer quadrant of right female breast: Secondary | ICD-10-CM | POA: Diagnosis not present

## 2023-07-18 DIAGNOSIS — G8929 Other chronic pain: Secondary | ICD-10-CM | POA: Diagnosis not present

## 2023-07-18 DIAGNOSIS — Z171 Estrogen receptor negative status [ER-]: Secondary | ICD-10-CM | POA: Insufficient documentation

## 2023-07-18 DIAGNOSIS — G629 Polyneuropathy, unspecified: Secondary | ICD-10-CM | POA: Insufficient documentation

## 2023-07-18 DIAGNOSIS — R519 Headache, unspecified: Secondary | ICD-10-CM

## 2023-07-18 DIAGNOSIS — M79605 Pain in left leg: Secondary | ICD-10-CM | POA: Diagnosis not present

## 2023-07-18 DIAGNOSIS — M7989 Other specified soft tissue disorders: Secondary | ICD-10-CM | POA: Diagnosis not present

## 2023-07-18 DIAGNOSIS — M25519 Pain in unspecified shoulder: Secondary | ICD-10-CM | POA: Diagnosis not present

## 2023-07-18 DIAGNOSIS — Z803 Family history of malignant neoplasm of breast: Secondary | ICD-10-CM | POA: Insufficient documentation

## 2023-07-18 DIAGNOSIS — C50911 Malignant neoplasm of unspecified site of right female breast: Secondary | ICD-10-CM | POA: Diagnosis present

## 2023-07-18 DIAGNOSIS — Z9189 Other specified personal risk factors, not elsewhere classified: Secondary | ICD-10-CM

## 2023-07-18 DIAGNOSIS — Z483 Aftercare following surgery for neoplasm: Secondary | ICD-10-CM | POA: Diagnosis present

## 2023-07-18 DIAGNOSIS — M79601 Pain in right arm: Secondary | ICD-10-CM

## 2023-07-18 DIAGNOSIS — Z800 Family history of malignant neoplasm of digestive organs: Secondary | ICD-10-CM | POA: Diagnosis not present

## 2023-07-18 NOTE — Progress Notes (Signed)
  Cancer Center Cancer Follow up:    Laurie Clam, MD [address] [place] [address] [place] [address]   DIAGNOSIS:  Cancer Staging  Malignant neoplasm of upper-outer quadrant of right breast in female, estrogen receptor negative (HCC) Staging form: Breast, AJCC 8th Edition - Clinical: Stage 0 (cTis (DCIS), cN0, cM0, G3, ER+, PR+, HER2: Not Assessed) - Signed by Odean Potts, MD on 10/11/2022 Stage prefix: Initial diagnosis Histologic grading system: 3 grade system - Pathologic stage from 11/13/2022: Stage IB (pT1c, pN0, cM0, G3, ER-, PR-, HER2-) - Signed by Izell Domino, MD on 05/21/2023 Stage prefix: Initial diagnosis Histologic grading system: 3 grade system   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, ER 30% weak, PR 2%   10/11/2022 Cancer Staging   Staging form: Breast, AJCC 8th Edition - Clinical: Stage 0 (cTis (DCIS), cN0, cM0, G3, ER+, PR+, HER2: Not Assessed) - Signed by Odean Potts, MD on 10/11/2022 Stage prefix: Initial diagnosis Histologic grading system: 3 grade system   10/11/2022 Genetic Testing   Declined hereditary cancer genetic testing    11/13/2022 Surgery   Right lumpectomy: Grade 3 IDC, 2 cm in size inferior and posterior margins are positive, DCIS posterior margin positive, lymphovascular invasion not identified, ER 0%, PR 0%, Ki-67 85%, HER2 0 by IHC   11/13/2022 Cancer Staging   Staging form: Breast, AJCC 8th Edition - Pathologic stage from 11/13/2022: Stage IB (pT1c, pN0, cM0, G3, ER-, PR-, HER2-) - Signed by Izell Domino, MD on 05/21/2023 Stage prefix: Initial diagnosis Histologic grading system: 3 grade system   12/01/2022 Surgery   Margin excision:  Posterior margin: Scattered foci of high-grade DCIS, 0/2 lymph nodes negative   01/01/2023 - 04/10/2023 Chemotherapy   Patient is on Treatment Plan : BREAST ADJUVANT DOSE DENSE AC q14d / PACLitaxel  q7d       CURRENT THERAPY: observation  INTERVAL HISTORY:  Discussed the use of AI scribe software for clinical note transcription with the patient, who gave verbal consent to proceed.  Laurie Bell 43 y.o. female with a history of cancer and chemotherapy, presents with leg pain and swelling. The pain is located in the left leg and has been ongoing, leading to the cessation of chemotherapy. The patient also reports a history of numbness in the leg, which has persisted for three years. The patient describes the sensation as numb, yet still able to feel it.  In addition to the leg pain, the patient also reports shoulder pain, which she attributes to stress. She has been receiving treatment from a pain management specialist, who has prescribed a low dose of oxycodone . The patient also takes gabapentin , which was initiated prior to her current treatment regimen, but reports it was not effective in managing her pain.  The patient also expresses anxiety about recent removal of a port used for chemotherapy, and concerns about potential blood clots in the leg. She has had a Doppler ultrasound of the leg last week, which did not reveal a clot.   Patient Active Problem List   Diagnosis Date Noted   Trichomonas vaginitis 03/28/2023   Friable cervix 03/27/2023   PCB (post coital bleeding) 03/27/2023   Screening for cervical cancer 03/27/2023   Sepsis (HCC) 02/17/2023   COVID-19 virus infection 02/17/2023  Lower extremity pain, anterior, right 01/22/2023   SIRS (systemic inflammatory response syndrome) (HCC) 01/21/2023   Port-A-Cath in place 01/01/2023   Malignant neoplasm of upper-outer quadrant of right breast in female, estrogen receptor negative (HCC) 10/09/2022   Multinodular goiter 03/22/2022    Hyperthyroidism 03/22/2022   MDD (major depressive disorder), recurrent episode, moderate (HCC) 11/03/2021   GAD (generalized anxiety disorder) 11/03/2021   Neck pain 10/28/2021   Cervical high risk HPV (human papillomavirus) test positive 10/17/2021   Irregular heart rhythm 04/15/2021   Numbness on left side 04/15/2021   Dysphagia 03/17/2021   Hypomagnesemia 02/23/2021   Gastroesophageal reflux disease    Hypokalemia 08/24/2020   Atypical chest pain 08/24/2020   Nausea and vomiting 08/24/2020   Hypocalcemia 08/24/2020   DUB (dysfunctional uterine bleeding) 01/12/2017   Fibroid, uterine 01/12/2017    is allergic to latex and tramadol .  MEDICAL HISTORY: Past Medical History:  Diagnosis Date   Anxiety    Arthritis    Breast cancer (HCC)    Chlamydia contact, treated    GERD (gastroesophageal reflux disease)    Hyperthyroidism     SURGICAL HISTORY: Past Surgical History:  Procedure Laterality Date   AXILLARY SENTINEL NODE BIOPSY Right 12/01/2022   Procedure: RIGHT AXILLARY SENTINEL NODE BIOPSY;  Surgeon: Vernetta Berg, MD;  Location: [facility];  Service: General;  Laterality: Right;   BREAST BIOPSY Right 10/02/2022   MM RT BREAST BX W LOC DEV 1ST LESION IMAGE BX SPEC STEREO GUIDE 10/02/2022 [facility]   BREAST BIOPSY  11/10/2022   MM RT RADIOACTIVE SEED LOC MAMMO GUIDE 11/10/2022 [facility]   BREAST LUMPECTOMY WITH RADIOACTIVE SEED LOCALIZATION Right 11/13/2022   Procedure: RIGHT BREAST LUMPECTOMY WITH RADIOACTIVE SEED LOCALIZATION;  Surgeon: Vernetta Berg, MD;  Location: [facility];  Service: General;  Laterality: Right;   ENDOMETRIAL ABLATION N/A 04/25/2017   Procedure: ENDOMETRIAL ABLATION With NOVASURE;  Surgeon: Eveline Lynwood MATSU, MD;  Location: [facility];  Service: Gynecology;  Laterality: N/A;   ESOPHAGEAL MANOMETRY N/A 07/27/2021   Procedure: ESOPHAGEAL MANOMETRY (EM);  Surgeon: Federico Rosario BROCKS, MD;  Location: [facility];   Service: Gastroenterology;  Laterality: N/A;   PORTACATH PLACEMENT N/A 12/01/2022   Procedure: INSERTION PORT-A-CATH WITH ULTRASOUND GUIDANCE;  Surgeon: Vernetta Berg, MD;  Location: [facility] OR;  Service: General;  Laterality: N/A;   RE-EXCISION OF BREAST CANCER,SUPERIOR MARGINS Right 12/01/2022   Procedure: RE-EXCISION OF RIGHT BREAST CANCER;  Surgeon: Vernetta Berg, MD;  Location: [facility] OR;  Service: General;  Laterality: Right;   TUBAL LIGATION  2003    SOCIAL HISTORY: Social History   Socioeconomic History   Marital status: Single    Spouse name: Not on file   Number of children: 2   Years of education: Not on file   Highest education level: Not on file  Occupational History   Occupation: Event Organiser: [employer]  Tobacco Use   Smoking status: Never   Smokeless tobacco: Never  Vaping Use   Vaping status: Never Used  Substance and Sexual Activity   Alcohol use: No   Drug use: Not Currently    Types: Marijuana    Comment: quit 2018   Sexual activity: Not Currently    Birth control/protection: Surgical  Other Topics Concern   Not on file  Social History Narrative   ** Merged History Encounter **       Right Handed  Lives in a one story home    Social Drivers  of Health   Financial Resource Strain: Medium Risk (11/03/2021)   Overall Financial Resource Strain (CARDIA)    Difficulty of Paying Living Expenses: Somewhat hard  Food Insecurity: No Food Insecurity (05/22/2023)   Hunger Vital Sign    Worried About Running Out of Food in the Last Year: Never true    Ran Out of Food in the Last Year: Never true  Transportation Needs: Unmet Transportation Needs (05/22/2023)   PRAPARE - Transportation    Lack of Transportation (Medical): Yes    Lack of Transportation (Non-Medical): Yes  Physical Activity: Sufficiently Active (11/03/2021)   Exercise Vital Sign    Days of Exercise per Week: 3 days    Minutes of Exercise per Session: 50 min  Stress: Stress Concern Present  (11/03/2021)   [employer] of Occupational Health - Occupational Stress Questionnaire    Feeling of Stress : Very much  Social Connections: Moderately Isolated (11/03/2021)   Social Connection and Isolation Panel [NHANES]    Frequency of Communication with Friends and Family: Once a week    Frequency of Social Gatherings with Friends and Family: Once a week    Attends Religious Services: More than 4 times per year    Active Member of [employer] or Organizations: No    Attends Banker Meetings: Never    Marital Status: Living with partner  Intimate Partner Violence: Not At Risk (02/17/2023)   Humiliation, Afraid, Rape, and Kick questionnaire    Fear of Current or Ex-Partner: No    Emotionally Abused: No    Physically Abused: No    Sexually Abused: No    FAMILY HISTORY: Family History  Problem Relation Age of Onset   Hypertension Mother    Hypertension Maternal Aunt    Breast cancer Maternal Grandmother        dx > 50   Stomach cancer Maternal Grandfather        dx > 50   Breast cancer Paternal Grandmother        dx > 50   Esophageal cancer Neg Hx    Rectal cancer Neg Hx    Colon cancer Neg Hx     Review of Systems  Constitutional:  Negative for appetite change, chills, fatigue, fever and unexpected weight change.  HENT:   Negative for hearing loss, lump/mass and trouble swallowing.   Eyes:  Negative for eye problems and icterus.  Respiratory:  Negative for chest tightness, cough and shortness of breath.   Cardiovascular:  Positive for leg swelling. Negative for chest pain and palpitations.  Gastrointestinal:  Negative for abdominal distention, abdominal pain, constipation, diarrhea, nausea and vomiting.  Endocrine: Negative for hot flashes.  Genitourinary:  Negative for difficulty urinating.   Musculoskeletal:  Negative for arthralgias.  Skin:  Negative for itching and rash.  Neurological:  Positive for numbness. Negative for dizziness, extremity weakness and  headaches.  Hematological:  Negative for adenopathy. Does not bruise/bleed easily.  Psychiatric/Behavioral:  Negative for depression. The patient is not nervous/anxious.       PHYSICAL EXAMINATION    Vitals:   07/18/23 0942  BP: (!) 139/99  Pulse: 90  Resp: 18  Temp: 98.6 F (37 C)  SpO2: 99%    Physical Exam Constitutional:      General: She is not in acute distress.    Appearance: Normal appearance. She is not toxic-appearing.  HENT:     Head: Normocephalic and atraumatic.     Mouth/Throat:     Mouth: Mucous membranes are  moist.     Pharynx: Oropharynx is clear. No oropharyngeal exudate or posterior oropharyngeal erythema.  Eyes:     General: No scleral icterus. Cardiovascular:     Rate and Rhythm: Normal rate and regular rhythm.     Pulses: Normal pulses.     Heart sounds: Normal heart sounds.  Pulmonary:     Effort: Pulmonary effort is normal.     Breath sounds: Normal breath sounds.  Abdominal:     General: Abdomen is flat. Bowel sounds are normal. There is no distension.     Palpations: Abdomen is soft.     Tenderness: There is no abdominal tenderness.  Musculoskeletal:        General: No swelling.     Cervical back: Neck supple.  Lymphadenopathy:     Cervical: No cervical adenopathy.  Skin:    General: Skin is warm and dry.     Findings: No rash.  Neurological:     General: No focal deficit present.     Mental Status: She is alert.  Psychiatric:        Mood and Affect: Mood normal.        Behavior: Behavior normal.     LABORATORY DATA:  CBC    Component Value Date/Time   WBC 4.9 07/05/2023 1448   RBC 4.63 07/05/2023 1448   HGB 12.5 07/05/2023 1448   HGB 12.6 05/08/2023 1121   HCT 39.0 07/05/2023 1448   PLT 209 07/05/2023 1448   PLT 238 05/08/2023 1121   MCV 84.2 07/05/2023 1448   MCH 27.0 07/05/2023 1448   MCHC 32.1 07/05/2023 1448   RDW 13.2 07/05/2023 1448   LYMPHSABS 1.4 07/05/2023 1448   MONOABS 0.5 07/05/2023 1448   EOSABS 0.3  07/05/2023 1448   BASOSABS 0.0 07/05/2023 1448    CMP     Component Value Date/Time   NA 140 07/05/2023 1448   NA 138 01/18/2022 1512   K 3.6 07/05/2023 1448   CL 105 07/05/2023 1448   CO2 26 07/05/2023 1448   GLUCOSE 108 (H) 07/05/2023 1448   BUN 9 07/05/2023 1448   BUN 9 01/18/2022 1512   CREATININE 0.79 07/05/2023 1448   CREATININE 0.74 05/08/2023 1121   CALCIUM  9.1 07/05/2023 1448   PROT 7.5 07/05/2023 1448   PROT 6.9 04/15/2021 0942   ALBUMIN 3.8 07/05/2023 1448   ALBUMIN 4.3 04/15/2021 0942   AST 28 07/05/2023 1448   AST 39 05/08/2023 1121   ALT 28 07/05/2023 1448   ALT 62 (H) 05/08/2023 1121   ALKPHOS 151 (H) 07/05/2023 1448   BILITOT 0.5 07/05/2023 1448   BILITOT 0.5 05/08/2023 1121   GFRNONAA >60 07/05/2023 1448   GFRNONAA >60 05/08/2023 1121   GFRAA >60 02/01/2020 2233    ASSESSMENT and THERAPY PLAN:   Malignant neoplasm of upper-outer quadrant of right breast in female, estrogen receptor negative (HCC) 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, DCIS posterior margin positive, lymphovascular invasion not identified, ER 0%, PR 0%, Ki-67 85%, HER2 0 by IHC Margin excision: Posterior margin: Scattered foci of high-grade DCIS, 0/2 lymph nodes negative, final margins negative  (10/02/2022: Initial biopsy was DCIS ER 30% PR 2%)   Treatment plan: Adjuvant chemotherapy with dose dense Adriamycin  and Cytoxan  followed by Taxol  x 7: 01/01/2023-04/10/2023 Adjuvant radiation therapy 05/30/2023-06/27/2023 ------------------------------------------------- Current treatment: surveillance with guardant reveal MRD monitoring.  Breast cancer surveillance: Mammograms will be done in July 2025  Leg Pain  and Swelling Chronic leg pain and swelling, worse with chemotherapy. Recent Doppler negative for DVT. Patient noted left leg appears larger than right. -Order CT of abdomen and pelvis to evaluate for any pelvic pathology  causing leg swelling.  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 -Continue current pain management regimen. -Continue f/u with pain management specialist  Shoulder Pain Chronic shoulder pain, possibly related to muscle tension. Patient is currently on Percocet for pain management. -Encourage yoga and stretching exercises for muscle relaxation and pain relief.  Follow-up Patient to follow up with pain management specialist. Will review results of CT scan at next visit.   All questions were answered. The patient knows to call the clinic with any problems, questions or concerns. We can certainly see the patient much sooner if necessary.  Total encounter time:30 minutes*in face-to-face visit time, chart review, lab review, care coordination, order entry, and documentation of the encounter time.    Morna Kendall, NP 07/19/23 12:41 PM Medical Oncology and Hematology [facility] [address] [place], [place] [address] Tel. [phone]    Fax. [phone]  *Total Encounter Time as defined by the Centers for Medicare and Medicaid Services includes, in addition to the face-to-face time of a patient visit (documented in the note above) non-face-to-face time: obtaining and reviewing outside history, ordering and reviewing medications, tests or procedures, care coordination (communications with other health care professionals or caregivers) and documentation in the medical record.

## 2023-07-18 NOTE — Therapy (Signed)
 OUTPATIENT PHYSICAL THERAPY  UPPER EXTREMITY ONCOLOGY TREATMENT  Patient Name: Laurie Bell MRN: [id number] DOB:Apr 06, 1981, 43 y.o., female Today's Date: 07/18/2023  END OF SESSION:  PT End of Session - 07/18/23 1508     Visit Number 3    Number of Visits 11    Date for PT Re-Evaluation 07/26/23    PT Start Time 1505    PT Stop Time 1559    PT Time Calculation (min) 54 min    Activity Tolerance Patient tolerated treatment well    Behavior During Therapy WFL for tasks assessed/performed             Past Medical History:  Diagnosis Date   Anxiety    Arthritis    Breast cancer (HCC)    Chlamydia contact, treated    GERD (gastroesophageal reflux disease)    Hyperthyroidism    Past Surgical History:  Procedure Laterality Date   AXILLARY SENTINEL NODE BIOPSY Right 12/01/2022   Procedure: RIGHT AXILLARY SENTINEL NODE BIOPSY;  Surgeon: Vernetta Berg, MD;  Location: [facility];  Service: General;  Laterality: Right;   BREAST BIOPSY Right 10/02/2022   MM RT BREAST BX W LOC DEV 1ST LESION IMAGE BX SPEC STEREO GUIDE 10/02/2022 [facility]   BREAST BIOPSY  11/10/2022   MM RT RADIOACTIVE SEED LOC MAMMO GUIDE 11/10/2022 [facility]   BREAST LUMPECTOMY WITH RADIOACTIVE SEED LOCALIZATION Right 11/13/2022   Procedure: RIGHT BREAST LUMPECTOMY WITH RADIOACTIVE SEED LOCALIZATION;  Surgeon: Vernetta Berg, MD;  Location: [facility];  Service: General;  Laterality: Right;   ENDOMETRIAL ABLATION N/A 04/25/2017   Procedure: ENDOMETRIAL ABLATION With NOVASURE;  Surgeon: Eveline Lynwood MATSU, MD;  Location: [facility];  Service: Gynecology;  Laterality: N/A;   ESOPHAGEAL MANOMETRY N/A 07/27/2021   Procedure: ESOPHAGEAL MANOMETRY (EM);  Surgeon: Federico Rosario BROCKS, MD;  Location: [facility];  Service: Gastroenterology;  Laterality: N/A;   PORTACATH PLACEMENT N/A 12/01/2022   Procedure: INSERTION PORT-A-CATH WITH ULTRASOUND GUIDANCE;  Surgeon: Vernetta Berg, MD;  Location: [facility];  Service: General;  Laterality: N/A;   RE-EXCISION OF BREAST CANCER,SUPERIOR MARGINS Right 12/01/2022   Procedure: RE-EXCISION OF RIGHT BREAST CANCER;  Surgeon: Vernetta Berg, MD;  Location: [facility] OR;  Service: General;  Laterality: Right;   TUBAL LIGATION  2003   Patient Active Problem List   Diagnosis Date Noted   Trichomonas vaginitis 03/28/2023   Friable cervix 03/27/2023   PCB (post coital bleeding) 03/27/2023   Screening for cervical cancer 03/27/2023   Sepsis (HCC) 02/17/2023   COVID-19 virus infection 02/17/2023   Lower extremity pain, anterior, right 01/22/2023   SIRS (systemic inflammatory response syndrome) (HCC) 01/21/2023   Port-A-Cath in place 01/01/2023   Malignant neoplasm of upper-outer quadrant of right breast in female, estrogen receptor negative (HCC) 10/09/2022   Multinodular goiter 03/22/2022   Hyperthyroidism 03/22/2022   MDD (major depressive disorder), recurrent episode, moderate (HCC) 11/03/2021   GAD (generalized anxiety disorder) 11/03/2021   Neck pain 10/28/2021   Cervical high risk HPV (human papillomavirus) test positive 10/17/2021   Irregular heart rhythm 04/15/2021   Numbness on left side 04/15/2021   Dysphagia 03/17/2021   Hypomagnesemia 02/23/2021   Gastroesophageal reflux disease    Hypokalemia 08/24/2020   Atypical chest pain 08/24/2020   Nausea and vomiting 08/24/2020   Hypocalcemia 08/24/2020   DUB (dysfunctional uterine bleeding) 01/12/2017   Fibroid, uterine 01/12/2017    PCP: Morna Kendall NP  REFERRING PROVIDER: Lauraine Golden, MD  REFERRING DIAG:  Diagnosis  C50.411,Z17.1 (ICD-10-CM) - Malignant neoplasm of upper-outer quadrant of right breast in female, estrogen receptor negative (HCC)    THERAPY DIAG:  Aftercare following surgery for neoplasm  Malignant neoplasm of right breast in female, estrogen receptor negative, unspecified site of breast (HCC)  At risk for lymphedema  Pain in right  arm  ONSET DATE: 10/04/22  Rationale for Evaluation and Treatment: Rehabilitation  SUBJECTIVE:                                                                                                                                                                                           SUBJECTIVE STATEMENT: I got my port out yesterday so that's just a bit sore.   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 It measures 1 cm and is located in the upper-inner quadrant. It is ER 30%+, PR>1% +, HER 2 unknown and Ki 67 unknown. She had a right lumpectomy performed on 11/13/2022. 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 She also had several positive margins and is now s/p a Right Re-excision with SLNB on 12/01/2022 with 0/2 LN's.   PAIN:  Are you having pain? Yes NPRS scale: 6.5/10 Pain location: the whole Rt arm.   Pain orientation: Right  PAIN TYPE: dull and sharp Pain description: constant  Aggravating factors: just always hurts Relieving factors: Nothing   PRECAUTIONS: None  RED FLAGS: None   WEIGHT BEARING RESTRICTIONS: No  FALLS:  Has patient fallen in last 6 months? 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    LEISURE: Nothing   HAND DOMINANCE: right   PRIOR LEVEL OF FUNCTION: Independent  PATIENT GOALS: decrease the arm pain    OBJECTIVE: Note: Objective measures were completed at Evaluation unless otherwise noted.  COGNITION: Overall cognitive status: Within functional limits for tasks assessed   PALPATION: Pt reports it just feels irritated all dermatomes with testing but able to feel all locations.  +1-2 ttp supraspinatus, UT, anterior shoulder on the Rt. Muscles noted to be more guarded vs very tight with STM.   OBSERVATIONS /  OTHER ASSESSMENTS: holding Rt UE with left for comfort  POSTURE: guarded  UPPER EXTREMITY AROM/PROM:  A/PROM RIGHT   baseline  06/21/23  Shoulder extension 55 40 - pain  Shoulder flexion 150 148 - painful  Shoulder abduction 173 130- maybe more of an ulnar nn  increase in tingling   Shoulder internal rotation 67   Shoulder external rotation 110 80 painful    (Blank rows = not tested)  A/PROM LEFT   baseline  Shoulder extension 45  Shoulder flexion 160  Shoulder abduction 180  Shoulder internal rotation 67  Shoulder external rotation 105    (Blank rows = not tested)  CERVICAL AROM: All stiff on the Rt UT region but without increase arm pain   UPPER EXTREMITY STRENGTH: Grip: Rt: 20# Lt: 38#  L-DEX LYMPHEDEMA SCREENING: The patient was assessed using the L-Dex machine today to produce a lymphedema index score:  L-DEX LYMPHEDEMA SCREENING Measurement Type: Unilateral L-DEX MEASUREMENT EXTREMITY: Upper Extremity POSITION : Standing DOMINANT SIDE: Right At Risk Side: Right BASELINE SCORE (UNILATERAL): 2.6 L-DEX SCORE (UNILATERAL): 6.1 VALUE CHANGE (UNILAT): 3.5   QUICK DASH SURVEY: 38% from 20.45%                                                                                                                            TREATMENT DATE:  07/18/23: No AA/ROM today as pt just had port removed yesterday so avoided exs that involved Lt UE Manual Therapy P/ROM to Rt shoulder into flex, abd and D2 with scapular depression by therapist throughout Lt S/L for STM with cocoa butter to lateral trunk and medial scap border, in supine to Lt UT Scap Mobs into protraction and retraction  07/11/23: Therapeutic Exercises Supine dowel exercises into flex, Rt abd and Rt er x 3, 5 sec holds each returning therapist demo and handout issued Manual Therapy P/ROM to Rt shoulder into flex, abd and D2 with scapular depression by therapist throughout Lt S/L for STM with cocoa butter to lateral trunk  and medial scap border Scap Mobs into protraction and retraction  06/21/23 Eval performed Sidelying STM with cocoa butter: Rt UT, supraspinatus, posterior shoulder with trigger point in supraspinatus up to the neck Scapular PROM Education on relaxation and monitoring mm and arm position when arm is hurting more as well as reasons for shoulder pain and arm pain with radiation.  Then STM in supine to Rt UT, anterior shoulder followed by MLD to the clavicular nodes, axillary nodes and anterior chest region Gave tg soft size small for comfort Pt will take a shower before radiation due to lotion used   PATIENT EDUCATION:  Education details: Supine dowel exs Person educated: Patient Education method: Medical Illustrator, VC's and handouts issued Education comprehension: verbalized understanding, returned demo and will benefit from further review  HOME EXERCISE PROGRAM: Posture, scapular retractions, focusing on relaxation and shoulder positioning, try tg soft, circles to neck and axilla  ASSESSMENT:  CLINICAL IMPRESSION: Today held off on AA/ROM exercises to avoid strain to Lt UE where port was just removed yesterday. Focused on manual therapy to decrease Rt upper quadrant tightness and improve her end P/ROM. Can resume regular exercises at next session.    OBJECTIVE IMPAIRMENTS: decreased activity tolerance, decreased knowledge of  condition, decreased knowledge of use of DME, decreased ROM, decreased strength, impaired UE functional use, and pain.   ACTIVITY LIMITATIONS: carrying, lifting, sleeping, and reach over head  PARTICIPATION LIMITATIONS: meal prep, cleaning, laundry, and occupation  PERSONAL FACTORS: 1-2 comorbidities: SLNB, radiation  are also affecting patient's functional outcome.   REHAB POTENTIAL: Excellent  CLINICAL DECISION MAKING: Evolving/moderate complexity  EVALUATION COMPLEXITY: Moderate  GOALS: Goals reviewed with patient? Yes  SHORT TERM  GOALS=LTGs: Target date: 07/26/23  Pt will decrease resting arm pain to 0/10 Baseline: Goal status: INITIAL  2.  Pt will improve Rt UE AROM to within 5deg of her baseline Baseline:  Goal status: INITIAL  3.  Pt will be ind with final HEP for posture and edema management. Baseline:  Goal status: INITIAL   PLAN:  PT FREQUENCY: 2x/week  PT DURATION: 5 weeks   PLANNED INTERVENTIONS: 97164- PT Re-evaluation, 97110-Therapeutic exercises, 97530- Therapeutic activity, 97112- Neuromuscular re-education, 97535- Self Care, [id number]- Manual therapy, Patient/Family education, Taping, Dry Needling, Therapeutic exercises, Therapeutic activity, and Neuromuscular re-education  PLAN FOR NEXT SESSION: Review HEP. Rt UE pain relief: STM supra, UT, neck as needed, PROM, MLD, nerve stretches - give formal stretches and exercises as able DN on POC if needed after radiation  Aden Berwyn Caldron, PTA 07/18/2023, 4:18 PM   SHOULDER: Flexion - Supine (Cane)        Cancer Rehab [phone]    Hold cane in both hands. Raise arms up overhead. Do not allow back to arch. Hold _5__ seconds. Do __5-10__ times; __1-2__ times a day.   SELF ASSISTED WITH OBJECT: Shoulder Abduction / Adduction - Supine    Hold cane with both hands. Move both arms from side to side, keep elbows straight.  Hold when stretch felt for __5__ seconds. Repeat __5-10__ times; __1-2__ times a day. Once this becomes easier progress to third picture bringing affected arm towards ear by staying out to side. Same hold for _5_seconds. Repeat  _5-10_ times, _1-2_ times/day.   SHOULDER: External Rotation - Supine (Cane)    Hold cane with both hands. Rotate arm away from body. Keep elbow on floor and next to body. _5-10__ reps per set, hold 5 seconds, _1-2__ sets per day. Add towel to keep elbow at side.   CHEST: Doorway, Bilateral - Standing    Stand with one foot in front of other. Standing in doorway, place hands on wall with elbows  bent at shoulder height. Lean forward. Hold __20_ seconds. __2-3_ reps per set, _2__ sets per day  Copyright  VHI. All rights reserved.

## 2023-07-19 ENCOUNTER — Encounter: Payer: Self-pay | Admitting: Hematology and Oncology

## 2023-07-19 ENCOUNTER — Encounter: Payer: Medicaid Other | Admitting: Adult Health

## 2023-07-19 NOTE — Assessment & Plan Note (Signed)
 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, DCIS posterior margin positive, lymphovascular invasion not identified, ER 0%, PR 0%, Ki-67 85%, HER2 0 by IHC Margin excision: Posterior margin: Scattered foci of high-grade DCIS, 0/2 lymph nodes negative, final margins negative  (10/02/2022: Initial biopsy was DCIS ER 30% PR 2%)   Treatment plan: Adjuvant chemotherapy with dose dense Adriamycin  and Cytoxan  followed by Taxol  x 7: 01/01/2023-04/10/2023 Adjuvant radiation therapy 05/30/2023-06/27/2023 ------------------------------------------------- Current treatment: surveillance with guardant reveal MRD monitoring.  Breast cancer surveillance: Mammograms will be done in July 2025  Leg Pain and Swelling Chronic leg pain and swelling, worse with chemotherapy. Recent Doppler negative for DVT. Patient noted left leg appears larger than right. -Order CT of abdomen and pelvis to evaluate for any pelvic pathology causing leg swelling.  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 -Continue current pain management regimen. -Continue f/u with pain management specialist  Shoulder Pain Chronic shoulder pain, possibly related to muscle tension. Patient is currently on Percocet for pain management. -Encourage yoga and stretching exercises for muscle relaxation and pain relief.  Follow-up Patient to follow up with pain management specialist. Will review results of CT scan at next visit.

## 2023-07-20 NOTE — Progress Notes (Incomplete)
  Laurie Bell presents today for a telephone follow-up after completing radiation to her right breast on 06/28/2023.  Pain: Skin:  ROM:  Lymphedema:  MedOnc F/U:  Other issues of note:   Pt reports Yes No Comments  Tamoxifen []  []    Letrozole []  []    Anastrazole []  []    Mammogram []  Date:  []

## 2023-07-20 NOTE — Telephone Encounter (Signed)
 Inbound call from patient requesting a call regarding update for PA. please

## 2023-07-22 ENCOUNTER — Emergency Department (HOSPITAL_COMMUNITY): Payer: Medicaid Other

## 2023-07-22 ENCOUNTER — Emergency Department (HOSPITAL_COMMUNITY)
Admission: EM | Admit: 2023-07-22 | Discharge: 2023-07-23 | Disposition: A | Discharge status: Home / Self Care | Payer: Medicaid Other | Attending: Emergency Medicine | Admitting: Emergency Medicine

## 2023-07-22 DIAGNOSIS — R0789 Other chest pain: Secondary | ICD-10-CM | POA: Diagnosis present

## 2023-07-22 DIAGNOSIS — R7309 Other abnormal glucose: Secondary | ICD-10-CM | POA: Diagnosis not present

## 2023-07-22 DIAGNOSIS — Z79899 Other long term (current) drug therapy: Secondary | ICD-10-CM | POA: Insufficient documentation

## 2023-07-22 DIAGNOSIS — Z91040 Latex allergy status: Secondary | ICD-10-CM | POA: Diagnosis not present

## 2023-07-22 DIAGNOSIS — E876 Hypokalemia: Secondary | ICD-10-CM | POA: Insufficient documentation

## 2023-07-22 DIAGNOSIS — K2100 Gastro-esophageal reflux disease with esophagitis, without bleeding: Secondary | ICD-10-CM | POA: Diagnosis not present

## 2023-07-22 DIAGNOSIS — R739 Hyperglycemia, unspecified: Secondary | ICD-10-CM

## 2023-07-22 NOTE — ED Triage Notes (Signed)
 Patient arrived with complaints of chest pain, nausea and some shortness of breath over the last two days. States she had her port removed Tuesday. Also mentions she has recently changed her acid reflux medication and does not feel like it is working as well.

## 2023-07-23 ENCOUNTER — Ambulatory Visit: Payer: Medicaid Other

## 2023-07-23 ENCOUNTER — Telehealth: Payer: Self-pay

## 2023-07-23 ENCOUNTER — Encounter: Payer: Self-pay | Admitting: Family Medicine

## 2023-07-23 LAB — BASIC METABOLIC PANEL
Anion gap: 9 (ref 5–15)
BUN: 16 mg/dL (ref 6–20)
CO2: 24 mmol/L (ref 22–32)
Calcium: 9.4 mg/dL (ref 8.9–10.3)
Chloride: 105 mmol/L (ref 98–111)
Creatinine, Ser: 0.84 mg/dL (ref 0.44–1.00)
GFR, Estimated: >60 mL/min (ref >60)
Glucose, Bld: 113 mg/dL — ABNORMAL HIGH (ref 70–99)
Potassium: 3.4 mmol/L — ABNORMAL LOW (ref 3.5–5.1)
Sodium: 138 mmol/L (ref 135–145)

## 2023-07-23 LAB — TROPONIN I (HIGH SENSITIVITY)
Troponin I (High Sensitivity): 6 ng/L (ref <18)
Troponin I (High Sensitivity): 6 ng/L (ref <18)

## 2023-07-23 LAB — CBC
HCT: 36.6 % (ref 36.0–46.0)
Hemoglobin: 12 g/dL (ref 12.0–15.0)
MCH: 27 pg (ref 26.0–34.0)
MCHC: 32.8 g/dL (ref 30.0–36.0)
MCV: 82.4 fL (ref 80.0–100.0)
Platelets: 246 10*3/uL (ref 150–400)
RBC: 4.44 MIL/uL (ref 3.87–5.11)
RDW: 13.6 % (ref 11.5–15.5)
WBC: 7.1 10*3/uL (ref 4.0–10.5)
nRBC: 0 % (ref 0.0–0.2)

## 2023-07-23 LAB — D-DIMER, QUANTITATIVE: D-Dimer, Quant: <0.27 ug{FEU}/mL (ref 0.00–0.50)

## 2023-07-23 MED ORDER — PANTOPRAZOLE SODIUM 40 MG PO TBEC
40.0000 mg | DELAYED_RELEASE_TABLET | Freq: Once | ORAL | Status: AC
  Administered 2023-07-23: 40 mg via ORAL
  Filled 2023-07-23: qty 1

## 2023-07-23 MED ORDER — ALUM & MAG HYDROXIDE-SIMETH 200-200-20 MG/5ML PO SUSP
30.0000 mL | Freq: Once | ORAL | Status: AC
  Administered 2023-07-23: 30 mL via ORAL
  Filled 2023-07-23: qty 30

## 2023-07-23 NOTE — Telephone Encounter (Signed)
 Pt was hospitalized and forgot to call and cancel her appt when she got home.

## 2023-07-23 NOTE — Telephone Encounter (Signed)
 Status please.

## 2023-07-23 NOTE — ED Provider Notes (Signed)
 [place] EMERGENCY DEPARTMENT AT [facility] Provider Note   CSN: [id number] Arrival date & time: 07/22/23  2319     History  Chief Complaint  Patient presents with   Chest Pain    Laurie Bell is a 43 y.o. female.  The history is provided by the patient.  Chest Pain She has history of GERD and comes in complaining of burning pain across her lower chest for the last 2 days.  She has history of GERD and this pain is similar.  She states that she did run out of her esomeprazole  a week ago and was not able to get the prescription refilled because her insurance would not pay for it.  She is also concerned that she might have a blood clot.  She had her Mediport removed in the last week.  She is a non-smoker and denies history of hypertension or diabetes or hyperlipidemia or family history of premature coronary atherosclerosis.   Home Medications Prior to Admission medications   Medication Sig Start Date End Date Taking? Authorizing Provider  acetaminophen  (TYLENOL ) 500 MG tablet Take 1,000 mg by mouth every 6 (six) hours as needed for moderate pain.    [provider]  albuterol  (VENTOLIN  HFA) 108 (90 Base) MCG/ACT inhaler TAKE 2 PUFFS BY MOUTH EVERY 6 HOURS AS NEEDED FOR WHEEZE OR SHORTNESS OF BREATH Patient taking differently: Inhale 2 puffs into the lungs every 6 (six) hours as needed for wheezing or shortness of breath. 12/22/22   Newlin, Enobong, MD  cyclobenzaprine  (FLEXERIL ) 10 MG tablet TAKE 1 TABLET BY MOUTH AT BEDTIME AS NEEDED FOR MUSCLE SPASMS Patient taking differently: Take 10 mg by mouth at bedtime. 10/31/22   Kirsteins, Cecilia Coe, MD  esomeprazole  (NEXIUM ) 40 MG capsule TAKE 1 CAPSULE (40 MG TOTAL) BY MOUTH 2 (TWO) TIMES DAILY BEFORE A MEAL. 07/07/23   Daina Drum, MD  gabapentin  (NEURONTIN ) 400 MG capsule TAKE 1 CAPSULE BY MOUTH IN AM AND NOON, AND 2 CAPSULE BY MOUTH AT BEDTIME 07/11/23   Gudena, Vinay, MD  hydrOXYzine  (ATARAX ) 25 MG tablet TAKE 1  TABLET(25 MG) BY MOUTH THREE TIMES DAILY 11/29/22   Newlin, Enobong, MD  linaclotide  (LINZESS ) 145 MCG CAPS capsule Take 1 capsule (145 mcg total) by mouth daily before breakfast. Patient not taking: Reported on 05/22/2023 05/19/22   Daina Drum, MD  methimazole  (TAPAZOLE ) 5 MG tablet Take 1 tablet (5 mg total) by mouth as directed. 1 tablet Monday through Thursday,, skip Friday, Saturdays and  Sundays 04/03/23   Shamleffer, Ibtehal Jaralla, MD  metoprolol  succinate (TOPROL -XL) 25 MG 24 hr tablet Take 0.5 tablets (12.5 mg total) by mouth daily. For increased heart rate 11/29/22   Newlin, Enobong, MD  naproxen  (NAPROSYN ) 500 MG tablet TAKE 1 TABLET BY MOUTH TWICE A DAY AS NEEDED FOR PAIN 03/19/23   Joaquin Mulberry, MD  oxyCODONE -acetaminophen  (PERCOCET/ROXICET) 5-325 MG tablet Take 1 tablet by mouth 4 (four) times daily. 07/13/23   [provider]  topiramate  (TOPAMAX ) 25 MG tablet Take 1 tablet (25 mg total) by mouth daily for 7 days, THEN 1 tablet (25 mg total) 2 (two) times daily. 07/16/23 08/22/23  Newlin, Enobong, MD  zolpidem  (AMBIEN ) 5 MG tablet Take 1 tablet (5 mg total) by mouth at bedtime as needed for sleep. 06/08/23   Cameron Cea, MD  medroxyPROGESTERone  (PROVERA ) 10 MG tablet Take 2 tablets by mouth daily Patient not taking: Reported on 06/05/2019 02/06/18 02/02/20  Jan Mcgill, MD  Allergies    Latex and Tramadol     Review of Systems   Review of Systems  Cardiovascular:  Positive for chest pain.  All other systems reviewed and are negative.   Physical Exam Updated Vital Signs BP (!) 133/90   Pulse 88   Temp 98.3 F (36.8 C) (Oral)   Resp 16   SpO2 100%  Physical Exam Vitals and nursing note reviewed.   43 year old female, resting comfortably and in no acute distress. Vital signs are normal. Oxygen saturation is 100%, which is normal. Head is normocephalic and atraumatic. PERRLA, EOMI.  Lungs are clear without rales, wheezes, or rhonchi. Chest is mildly  tender over the lower sternal area.  There is no crepitus.  Surgical site for removal of port in the left upper chest is healing well without signs of infection, no swelling.  It is moderately tender. Heart has regular rate and rhythm without murmur. Abdomen is soft, flat, with mild to moderate epigastric tenderness.  There is no rebound or guarding. Extremities have no cyanosis or edema, full range of motion is present. Skin is warm and dry without rash. Neurologic: Mental status is normal, cranial nerves are intact, there are no motor or sensory deficits.  ED Results / Procedures / Treatments   Labs (all labs ordered are listed, but only abnormal results are displayed) Labs Reviewed  BASIC METABOLIC PANEL - Abnormal; Notable for the following components:      Result Value   Potassium 3.4 (*)    Glucose, Bld 113 (*)    All other components within normal limits  CBC  D-DIMER, QUANTITATIVE  TROPONIN I (HIGH SENSITIVITY)  TROPONIN I (HIGH SENSITIVITY)    EKG EKG Interpretation Date/Time:  Sunday July 22 2023 23:39:39 EST Ventricular Rate:  90 PR Interval:  173 QRS Duration:  70 QT Interval:  452 QTC Calculation: 554 R Axis:   19  Text Interpretation: Sinus rhythm Low voltage, precordial leads Probable anteroseptal infarct, old Prolonged QT interval When compared with ECG of 07/05/2023, Premature ventricular complexes are no longer present Confirmed by Alissa April ([id number]) on 07/23/2023 12:19:28 AM  Radiology DG Chest 2 View Result Date: 07/23/2023 CLINICAL DATA:  chest pain EXAM: CHEST - 2 VIEW COMPARISON:  Chest x-ray 02/19/2023 FINDINGS: The heart and mediastinal contours are within normal limits. No focal consolidation. No pulmonary edema. No pleural effusion. No pneumothorax. No acute osseous abnormality. IMPRESSION: No active cardiopulmonary disease. Electronically Signed   By: Morgane  Naveau M.D.   On: 07/23/2023 00:16    Procedures Procedures  Cardiac monitor shows  normal sinus rhythm, per my interpretation.  Medications Ordered in ED Medications  alum & mag hydroxide-simeth (MAALOX/MYLANTA) 200-200-20 MG/5ML suspension 30 mL (30 mLs Oral Given 07/23/23 0239)  pantoprazole  (PROTONIX ) EC tablet 40 mg (40 mg Oral Given 07/23/23 0238)    ED Course/ Medical Decision Making/ A&P             HEART Score: 0                    Medical Decision Making Amount and/or Complexity of Data Reviewed Labs: ordered. Radiology: ordered.  Risk OTC drugs. Prescription drug management.   Chest pain which most likely represents untreated GERD.  I have low index of suspicion for ACS or pulmonary embolism.  However, she does have history of cancer so she would be at increased risk for pulmonary embolism.  I have reviewed her electrocardiogram, and my interpretation is low voltage  with no acute ST or T changes and not significantly changed from prior except for the absence of ventricular ectopy.  Chest x-ray shows no active cardiopulmonary disease.  I have independently viewed the images, and agree with radiologist's interpretation.  I have reviewed her laboratory test, and my interpretation is mild hypokalemia, mildly elevated random glucose level, normal troponin x 2, normal CBC.  I have ordered a dose of oral potassium.  Elevated glucose will need to be followed as an outpatient.  I have ordered a D-dimer to rule out pulmonary embolism.  However, I strongly suspect will be elevated because of recent minor surgery.  I have ordered a dose of antiacid and a dose of pantoprazole .  I have reviewed her past records and note office visit on 07/18/2023 which states that she is currently undergoing surveillance foot no active treatment for her cancer.  She had hospital admission on 08/24/2020 for chest pain which was felt to be due to GERD.  Echocardiogram on 01/22/2023 showed normal ejection fraction and normal left ventricular diastolic parameters.  Heart score is 0, which puts her at low  risk for major adverse cardiac events in the next 6 weeks.  Repeat troponin is normal and D-dimer is normal.  Pulmonary embolism is ruled out.  She had moderate relief of pain with antiacid and pantoprazole .  I feel she is safe for discharge.  I have recommended she use over-the-counter proton pump inhibitors as needed including starting off with taking it twice a day for the first 2 weeks.  Use antacids as needed.  Final Clinical Impression(s) / ED Diagnoses Final diagnoses:  Gastroesophageal reflux disease with esophagitis without hemorrhage  Hypokalemia  Elevated random blood glucose level    Rx / DC Orders ED Discharge Orders     None         Alissa April, MD 07/23/23 [phone]

## 2023-07-23 NOTE — Telephone Encounter (Signed)
 Patient is requesting to speak with someone for an update for PA.

## 2023-07-23 NOTE — Discharge Instructions (Addendum)
 Take an over-the-counter proton pump inhibitor until your insurance coverage for esomeprazole  kicks in.  You can buy omeprazole , esomeprazole , lansoprazole without a prescription.  I suggested you take this twice a day for the first 2 weeks, then reduce to once a day.  You may also take famotidine  (Pepcid  AC) for additional acid blocking effect.  You may take antacids such as Maalox, Mylanta, Tums, Pepto-Bismol as needed.  I suggest that you use the website goodrx.com to check prescription medication prices-especially if your insurance is not covering them.

## 2023-07-23 NOTE — Telephone Encounter (Signed)
 Called pt due to no show. She was in the hospital and forgot to call before falling asleep.

## 2023-07-24 ENCOUNTER — Telehealth: Payer: Self-pay | Admitting: Pharmacy Technician

## 2023-07-24 ENCOUNTER — Other Ambulatory Visit: Payer: Self-pay

## 2023-07-24 ENCOUNTER — Other Ambulatory Visit (HOSPITAL_COMMUNITY): Payer: Self-pay

## 2023-07-24 MED ORDER — ESOMEPRAZOLE MAGNESIUM 40 MG PO CPDR
40.0000 mg | DELAYED_RELEASE_CAPSULE | Freq: Two times a day (BID) | ORAL | 4 refills | Status: DC
  Filled 2023-07-25: qty 60, 30d supply, fill #0
  Filled 2023-08-24 – 2023-08-27 (×2): qty 60, 30d supply, fill #1
  Filled 2023-09-26: qty 60, 30d supply, fill #2
  Filled 2023-10-26: qty 60, 30d supply, fill #3

## 2023-07-24 NOTE — Telephone Encounter (Signed)
Pharmacy Patient Advocate Encounter   Received notification from CoverMyMeds that prior authorization for ESOMEPRAZOLE 40MG  is required/requested.   Insurance verification completed.   The patient is insured through [facility] .   Per test claim: PA required; PA submitted to above mentioned insurance via CoverMyMeds Key/confirmation #/EOC [id number] Status is pending

## 2023-07-25 ENCOUNTER — Other Ambulatory Visit (HOSPITAL_COMMUNITY): Payer: Self-pay

## 2023-07-25 ENCOUNTER — Telehealth: Payer: Self-pay | Admitting: Pharmacist

## 2023-07-25 ENCOUNTER — Encounter: Payer: Self-pay | Admitting: Hematology and Oncology

## 2023-07-25 NOTE — Telephone Encounter (Signed)
Insurance denied request, but we sent this to the pharmacist for an appeal, we listed on prior auth form patient has tried and failed omperazole and pantoprazole. We will update once they receive appeal and come to a determination.

## 2023-07-25 NOTE — Telephone Encounter (Signed)
Appeal has been submitted for esomeprazole. Will advise when response is received, please be advised that most companies may take 30 days to make a decision. Appeal letter and supporting documentation have been faxed to [phone] on 07/25/2023 @12 :42pm.  Thank you, Dellie Burns, PharmD Clinical Pharmacist  [place]  Direct Dial: [phone]

## 2023-07-26 ENCOUNTER — Other Ambulatory Visit (HOSPITAL_COMMUNITY): Payer: Self-pay

## 2023-07-26 NOTE — Telephone Encounter (Signed)
Pharmacy Patient Advocate Encounter  Received notification from [facility] that Prior Authorization for ESOMEPRAZOLE 40MG  has been APPROVED from 2.13.25 to 2.13.26. Unable to obtain price due to refill too soon rejection, last fill date 2.13.25 next available fill date3.6.25

## 2023-07-30 ENCOUNTER — Ambulatory Visit: Payer: Medicaid Other

## 2023-07-30 ENCOUNTER — Ambulatory Visit (HOSPITAL_COMMUNITY)
Admission: RE | Admit: 2023-07-30 | Discharge: 2023-07-30 | Disposition: A | Discharge status: Home / Self Care | Payer: Medicaid Other | Source: Ambulatory Visit | Attending: Adult Health | Admitting: Adult Health

## 2023-07-30 DIAGNOSIS — C50911 Malignant neoplasm of unspecified site of right female breast: Secondary | ICD-10-CM

## 2023-07-30 DIAGNOSIS — Z9189 Other specified personal risk factors, not elsewhere classified: Secondary | ICD-10-CM

## 2023-07-30 DIAGNOSIS — C50411 Malignant neoplasm of upper-outer quadrant of right female breast: Secondary | ICD-10-CM | POA: Diagnosis present

## 2023-07-30 DIAGNOSIS — M7989 Other specified soft tissue disorders: Secondary | ICD-10-CM

## 2023-07-30 DIAGNOSIS — Z171 Estrogen receptor negative status [ER-]: Secondary | ICD-10-CM

## 2023-07-30 DIAGNOSIS — M79601 Pain in right arm: Secondary | ICD-10-CM

## 2023-07-30 DIAGNOSIS — Z483 Aftercare following surgery for neoplasm: Secondary | ICD-10-CM

## 2023-07-30 MED ORDER — IOHEXOL 300 MG/ML  SOLN
100.0000 mL | Freq: Once | INTRAMUSCULAR | Status: AC | PRN
  Administered 2023-07-30: 100 mL via INTRAVENOUS

## 2023-07-30 NOTE — Therapy (Signed)
OUTPATIENT PHYSICAL THERAPY  UPPER EXTREMITY ONCOLOGY TREATMENT  Patient Name: Laurie Bell MRN: [id number] DOB:04-29-1981, 43 y.o., female Today's Date: 07/30/2023  END OF SESSION:  PT End of Session - 07/30/23 1501     Visit Number 4    Number of Visits 19    Date for PT Re-Evaluation 08/27/23    Authorization Type none needed    PT Start Time 1502    PT Stop Time 1600    PT Time Calculation (min) 58 min    Activity Tolerance Patient tolerated treatment well    Behavior During Therapy WFL for tasks assessed/performed             Past Medical History:  Diagnosis Date   Anxiety    Arthritis    Breast cancer (HCC)    Chlamydia contact, treated    GERD (gastroesophageal reflux disease)    Hyperthyroidism    Past Surgical History:  Procedure Laterality Date   AXILLARY SENTINEL NODE BIOPSY Right 12/01/2022   Procedure: RIGHT AXILLARY SENTINEL NODE BIOPSY;  Surgeon: Abigail Miyamoto, MD;  Location: [facility];  Service: General;  Laterality: Right;   BREAST BIOPSY Right 10/02/2022   MM RT BREAST BX W LOC DEV 1ST LESION IMAGE BX SPEC STEREO GUIDE 10/02/2022 [facility]   BREAST BIOPSY  11/10/2022   MM RT RADIOACTIVE SEED LOC MAMMO GUIDE 11/10/2022 [facility]   BREAST LUMPECTOMY WITH RADIOACTIVE SEED LOCALIZATION Right 11/13/2022   Procedure: RIGHT BREAST LUMPECTOMY WITH RADIOACTIVE SEED LOCALIZATION;  Surgeon: Abigail Miyamoto, MD;  Location: [facility];  Service: General;  Laterality: Right;   ENDOMETRIAL ABLATION N/A 04/25/2017   Procedure: ENDOMETRIAL ABLATION With NOVASURE;  Surgeon: Adam Phenix, MD;  Location: [facility];  Service: Gynecology;  Laterality: N/A;   ESOPHAGEAL MANOMETRY N/A 07/27/2021   Procedure: ESOPHAGEAL MANOMETRY (EM);  Surgeon: Imogene Burn, MD;  Location: [facility];  Service: Gastroenterology;  Laterality: N/A;   PORTACATH PLACEMENT N/A 12/01/2022   Procedure: INSERTION PORT-A-CATH WITH ULTRASOUND  GUIDANCE;  Surgeon: Abigail Miyamoto, MD;  Location: [facility];  Service: General;  Laterality: N/A;   RE-EXCISION OF BREAST CANCER,SUPERIOR MARGINS Right 12/01/2022   Procedure: RE-EXCISION OF RIGHT BREAST CANCER;  Surgeon: Abigail Miyamoto, MD;  Location: [facility];  Service: General;  Laterality: Right;   TUBAL LIGATION  2003   Patient Active Problem List   Diagnosis Date Noted   Trichomonas vaginitis 03/28/2023   Friable cervix 03/27/2023   PCB (post coital bleeding) 03/27/2023   Screening for cervical cancer 03/27/2023   Sepsis (HCC) 02/17/2023   COVID-19 virus infection 02/17/2023   Lower extremity pain, anterior, right 01/22/2023   SIRS (systemic inflammatory response syndrome) (HCC) 01/21/2023   Port-A-Cath in place 01/01/2023   Malignant neoplasm of upper-outer quadrant of right breast in female, estrogen receptor negative (HCC) 10/09/2022   Multinodular goiter 03/22/2022   Hyperthyroidism 03/22/2022   MDD (major depressive disorder), recurrent episode, moderate (HCC) 11/03/2021   GAD (generalized anxiety disorder) 11/03/2021   Neck pain 10/28/2021   Cervical high risk HPV (human papillomavirus) test positive 10/17/2021   Irregular heart rhythm 04/15/2021   Numbness on left side 04/15/2021   Dysphagia 03/17/2021   Hypomagnesemia 02/23/2021   Gastroesophageal reflux disease    Hypokalemia 08/24/2020   Atypical chest pain 08/24/2020   Nausea and vomiting 08/24/2020   Hypocalcemia 08/24/2020   DUB (dysfunctional uterine bleeding) 01/12/2017   Fibroid, uterine 01/12/2017    PCP: Lillard Anes NP  REFERRING PROVIDER:  Lonie Peak, MD  REFERRING DIAG:  Diagnosis  C50.411,Z17.1 (ICD-10-CM) - Malignant neoplasm of upper-outer quadrant of right breast in female, estrogen receptor negative (HCC)    THERAPY DIAG:  Aftercare following surgery for neoplasm  Malignant neoplasm of right breast in female, estrogen receptor negative, unspecified site of breast (HCC)  At risk for  lymphedema  Pain in right arm  ONSET DATE: 10/04/22  Rationale for Evaluation and Treatment: Rehabilitation  SUBJECTIVE:                                                                                                                                                                                           SUBJECTIVE STATEMENT: I'm sorry I missed my last appts. One I had just got out of the hospital and then last time I was at work and forgot.   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 It measures 1 cm and is located in the upper-inner quadrant. It is ER 30%+, PR>1% +, HER 2 unknown and Ki 67 unknown. She had a right lumpectomy performed on 11/13/2022. 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 She also had several positive margins and is now s/p a Right Re-excision with SLNB on 12/01/2022 with 0/2 LN's.   PAIN:  Are you having pain? Yes NPRS scale: 5-7/10 Pain location: the whole Rt arm Pain orientation: Right  PAIN TYPE: dull and sharp, numbness in hands at times Pain description: constant  Aggravating factors: just always hurts, nighttime gets worse Relieving factors: gabapentin helps for a little while, new Hep stretches help for a little while  PRECAUTIONS: None  RED FLAGS: None   WEIGHT BEARING RESTRICTIONS: No  FALLS:  Has patient fallen in last 6 months? 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    LEISURE: Nothing   HAND DOMINANCE: right   PRIOR LEVEL OF FUNCTION: Independent  PATIENT GOALS: decrease the arm pain    OBJECTIVE: Note: Objective measures were completed at Evaluation unless otherwise noted.  COGNITION: Overall cognitive status: Within functional limits for tasks assessed   PALPATION: Pt reports it just  feels irritated all dermatomes with testing but able to feel all locations.  +1-2 ttp supraspinatus, UT, anterior shoulder on the Rt. Muscles noted to be more guarded vs very tight with STM.   OBSERVATIONS / OTHER ASSESSMENTS: holding Rt UE with left for comfort  POSTURE: guarded  UPPER EXTREMITY AROM/PROM:  A/PROM RIGHT   baseline  06/21/23 07/30/23  Shoulder extension 55 40 - pain 52  Shoulder flexion 150 148 - painful 161  Shoulder abduction 173 130- maybe more of an ulnar nn increase in tingling  149  Shoulder internal rotation 67  64  Shoulder external rotation 110 80 painful 90    (Blank rows = not tested)  A/PROM LEFT   baseline  Shoulder extension 45  Shoulder flexion 160  Shoulder abduction 180  Shoulder internal rotation 67  Shoulder external rotation 105    (Blank rows = not tested)  CERVICAL AROM: All stiff on the Rt UT region but without increase arm pain   UPPER EXTREMITY STRENGTH: Grip: Rt: 20# Lt: 38#  L-DEX LYMPHEDEMA SCREENING: The patient was assessed using the L-Dex machine today to produce a lymphedema index score:  L-DEX LYMPHEDEMA SCREENING Measurement Type: Unilateral L-DEX MEASUREMENT EXTREMITY: Upper Extremity POSITION : Standing DOMINANT SIDE: Right At Risk Side: Right BASELINE SCORE (UNILATERAL): 2.6 L-DEX SCORE (UNILATERAL): 6.1 VALUE CHANGE (UNILAT): 3.5   QUICK DASH SURVEY: 38% from 20.45%                                                                                                                            TREATMENT DATE:  07/30/23: Self Care Pt has missed last few appts due to being in hospital then was at work and forgot. Discussed current functional status and progress towards goals. Re measured A/ROM of Lt shoulder, grip strength: Rt 18# and Lt 26# MMT taken of Rt UE: Flex 4-/5, Abd 4-/5, tried to assess Lt UE but some cogwheel present so unable to determine accurate strength Spent time educating pt in diaphragmatic breathing and  encouraged her to incorporate this if she feels a panic attack starting or after.  Therapeutic Exercises Pulleys into flex and abd x 2 mins each with pt returning therapist demo Lt forearm propped on EOB and holding 1# for Lt wrist ext, flex and radial deviation x 5 each. No pain, just weakness reported Supine scapular series with yellow theraband x 8 horz and er, pt reports pain with er in post shoulder and upper arm; then narrow and wide grip x 4 each, and bil D2 x 6 each, some pain reported with Lt UE where port was removed Manual Therapy P/ROM to Rt shoulder into flex, abd and D2 with scapular depression by therapist throughout and VC's to relax due to muscle guarding STM to bil UT's where pt reports feeling tightness as she has always held tension here. Spent time during STM to explain how incorporating diaphragmatic breathing into her day can help these muscles to relax.   07/18/23: No AA/ROM today as pt just had port removed yesterday so avoided exs that involved Lt UE Manual Therapy P/ROM to Rt shoulder into flex, abd and D2 with scapular depression by therapist throughout Lt S/L for STM with cocoa butter to lateral trunk  and medial scap border, in supine to Lt UT Scap Mobs into protraction and retraction  07/11/23: Therapeutic Exercises Supine dowel exercises into flex, Rt abd and Rt er x 3, 5 sec holds each returning therapist demo and handout issued Manual Therapy P/ROM to Rt shoulder into flex, abd and D2 with scapular depression by therapist throughout Lt S/L for STM with cocoa butter to lateral trunk and medial scap border Scap Mobs into protraction and retraction  06/21/23 Eval performed Sidelying STM with cocoa butter: Rt UT, supraspinatus, posterior shoulder with trigger point in supraspinatus up to the neck Scapular PROM Education on relaxation and monitoring mm and arm position when arm is hurting more as well as reasons for shoulder pain and arm pain with radiation.  Then  STM in supine to Rt UT, anterior shoulder followed by MLD to the clavicular nodes, axillary nodes and anterior chest region Gave tg soft size small for comfort Pt will take a shower before radiation due to lotion used   PATIENT EDUCATION:  Education details: Supine scapular series with yellow theraband and Lt wrist strengthening Person educated: Patient Education method: Medical illustrator, VC's and handouts issued Education comprehension: verbalized understanding, returned demo and will benefit from further review  HOME EXERCISE PROGRAM: Posture, scapular retractions, focusing on relaxation and shoulder positioning, try tg soft, circles to neck and axilla 07/30/23 - Supine scapular series with yellow theraband and Lt wrist strengthening   ASSESSMENT:  CLINICAL IMPRESSION: Renewal done this session and will cont progress towards unmet goals. Pt returns after missing a few appts due to being in hospital one time and was at work and forgot appt. Today she was reassessed and she would like to cont physical therapy at this time. Spoke with Gwenevere Abbot, PT who was agreeable to renewal at this time due to following. Pt has made good progress towards her A/ROM goal, but her grip strength is less by 2# than it was at eval. Her arm pain is not much improved at this time but pt also hasn't been able to be consistent with appts as of yet to see progress with this goal. Spent time educating her about compensatory techniques, especially when she has panic attacks due to her anxiety and that she probably uses accessory muscles, including UT's where she reports maximal tightness at time. Pt reports this has been more of an issue recently with her cancer diagnosis and subsequent treatments. Pt will benefit from continued physical therapy at this time to address continued deficits that she has made some improvement with, but can cont towards improving Rt grip strength, and decreasing Rt arm pain by  progressing her HEP to include diaphragmatic breathing techniques to help decrease overall muscle guarding. Also spoke with pt about after she has panic attacks to try deep breathing and cervical A/ROM to decrease UT pain which is probable part of Rt UE pain. Pt verbalized understanding.    OBJECTIVE IMPAIRMENTS: decreased activity tolerance, decreased knowledge of condition, decreased knowledge of use of DME, decreased ROM, decreased strength, impaired UE functional use, and pain.   ACTIVITY LIMITATIONS: carrying, lifting, sleeping, and reach over head  PARTICIPATION LIMITATIONS: meal prep, cleaning, laundry, and occupation  PERSONAL FACTORS: 1-2 comorbidities: SLNB, radiation  are also affecting patient's functional outcome.   REHAB POTENTIAL: Excellent  CLINICAL DECISION MAKING: Evolving/moderate complexity  EVALUATION COMPLEXITY: Moderate  GOALS: Goals reviewed with patient? Yes  SHORT TERM GOALS=LTGs: Target date: 08/27/23  Pt will decrease resting arm pain to 0/10 Baseline: 9/10;  07/30/23 - 5-7/10 Goal status: ONGOING  2.  Pt will improve Rt UE AROM to within 5deg of her baseline Baseline: see above; 07/30/23 - abduction only one not met as of today Goal status: PARTIALLY MET  3.  Pt will be ind with final HEP for posture and edema management. Baseline:  Goal status: ONGOING   PLAN:  PT FREQUENCY: 2x/week  PT DURATION: 5 weeks   PLANNED INTERVENTIONS: 97164- PT Re-evaluation, 97110-Therapeutic exercises, 97530- Therapeutic activity, 97112- Neuromuscular re-education, 97535- Self Care, 16109- Manual therapy, Patient/Family education, Taping, Dry Needling, Therapeutic exercises, Therapeutic activity, and Neuromuscular re-education  PLAN FOR NEXT SESSION: Renewal done this session. Review HEP. Rt UE pain relief: STM supra, UT, neck as needed, PROM, MLD, nerve stretches - give formal stretches and exercises as able DN on POC if needed after radiation  Hermenia Bers, PTA 07/30/2023, 5:38 PM  Over Head Pull: Narrow and Wide Grip   Cancer Rehab [phone]   On back, knees bent, feet flat, band across thighs, elbows straight but relaxed. Pull hands apart (start). Keeping elbows straight, bring arms up and over head, hands toward floor. Keep pull steady on band. Hold momentarily. Return slowly, keeping pull steady, back to start. Then do same with a wider grip on the band (past shoulder width) Repeat _5-10__ times. Band color __yellow____   Side Pull: Double Arm   On back, knees bent, feet flat. Arms perpendicular to body, shoulder level, elbows straight but relaxed. Pull arms out to sides, elbows straight. Resistance band comes across collarbones, hands toward floor. Hold momentarily. Slowly return to starting position. Repeat _5-10__ times. Band color _yellow____   Sword   On back, knees bent, feet flat, left hand on left hip, right hand above left. Pull right arm DIAGONALLY (hip to shoulder) across chest. Bring right arm along head toward floor. Hold momentarily. Slowly return to starting position. Repeat _5-10__ times. Do with left arm. Band color _yellow_____   Shoulder Rotation: Double Arm   On back, knees bent, feet flat, elbows tucked at sides, bent 90, hands palms up. Pull hands apart and down toward floor, keeping elbows near sides. Hold momentarily. Slowly return to starting position. Repeat _5-10__ times. Band color __yellow____    Wrist Flexion: (Eccentric) - Elbow Flexed    Arm on table, elbow bent, palm down, bend wrist, lowering hand. Then turn forearm over, keeping wrist bent, hand up. Slowly lower hand for 3-5 seconds. _5-10__ reps per set, _1-2__ sets per day, and use _1-2__ lbs.  Wrist Extension: (Eccentric) - Elbow Extended    Arm on table, elbow straight, palm up, bend wrist, lowering hand. Then turn forearm over, keeping wrist bent, hand up. Slowly lower hand for 3-5 seconds. _5-10__ reps per set, _1-2__ sets per day,  and use _1-2__ lbs.  AROM: Wrist Radial Deviation    With right thumb up, bend wrist up. Repeat __5-10__ times per set. Do __1-2__ sets per session and use 1-2 lbs. Do _1-2___ sessions per day.

## 2023-07-30 NOTE — Patient Instructions (Addendum)
 Marland Kitchen

## 2023-07-31 ENCOUNTER — Inpatient Hospital Stay: Payer: Medicaid Other | Admitting: Internal Medicine

## 2023-07-31 ENCOUNTER — Telehealth: Payer: Self-pay | Admitting: *Deleted

## 2023-07-31 NOTE — Telephone Encounter (Signed)
PC to patient regarding missed appointment today, she states she forgot.  Informed patient our scheduling department will be contacting her to reschedule, she verbalizes understanding.  Scheduling message sent.

## 2023-08-01 ENCOUNTER — Other Ambulatory Visit: Payer: Self-pay | Admitting: Physical Medicine & Rehabilitation

## 2023-08-01 ENCOUNTER — Ambulatory Visit: Payer: Medicaid Other

## 2023-08-02 ENCOUNTER — Telehealth: Payer: Self-pay | Admitting: Internal Medicine

## 2023-08-02 ENCOUNTER — Ambulatory Visit
Admission: RE | Admit: 2023-08-02 | Discharge: 2023-08-02 | Disposition: A | Discharge status: Home / Self Care | Payer: Medicaid Other | Source: Ambulatory Visit | Attending: Radiation Oncology | Admitting: Radiation Oncology

## 2023-08-02 NOTE — Telephone Encounter (Signed)
 Marland Kitchen

## 2023-08-03 ENCOUNTER — Other Ambulatory Visit: Payer: Self-pay | Admitting: Physical Medicine & Rehabilitation

## 2023-08-08 NOTE — Progress Notes (Unsigned)
 08/08/2023 Laurie Bell [id number] 09/18/1980   Chief Complaint:  History of Present Illness: Laurie Bell is a 43 year old female with a past medical history of arthritis, anxiety, hyperthyroidism, breast cancer s/p lumpectomy and GERD. She is known by Dr. Leonides Schanz.      Latest Ref Rng & Units 07/23/2023   12:31 AM 07/05/2023    2:48 PM 05/08/2023   11:21 AM  CBC  WBC 4.0 - 10.5 K/uL 7.1  4.9  5.6   Hemoglobin 12.0 - 15.0 g/dL 69.6  29.5  28.4   Hematocrit 36.0 - 46.0 % 36.6  39.0  37.4   Platelets 150 - 400 K/uL 246  209  238        Latest Ref Rng & Units 07/23/2023   12:31 AM 07/05/2023    2:48 PM 05/08/2023   11:21 AM  CMP  Glucose 70 - 99 mg/dL 132  440  102   BUN 6 - 20 mg/dL 16  9  9    Creatinine 0.44 - 1.00 mg/dL 7.25  3.66  4.40   Sodium 135 - 145 mmol/L 138  140  140   Potassium 3.5 - 5.1 mmol/L 3.4  3.6  3.3   Chloride 98 - 111 mmol/L 105  105  106   CO2 22 - 32 mmol/L 24  26  29    Calcium 8.9 - 10.3 mg/dL 9.4  9.1  9.6   Total Protein 6.5 - 8.1 g/dL  7.5  7.3   Total Bilirubin 0.0 - 1.2 mg/dL  0.5  0.5   Alkaline Phos 38 - 126 U/L  151  143   AST 15 - 41 U/L  28  39   ALT 0 - 44 U/L  28  62     CT renal stone study 12/20/16: IMPRESSION: 1. No acute intra-abdominal or pelvic pathology. Specifically there is no hydronephrosis or nephrolithiasis. 2. Moderate colonic stool burden. No bowel obstruction or active inflammation. Normal appendix. 3. Prominent uterus with poorly visualized fibroid.   Barium swallow 08/26/20: IMPRESSION: Moderate gastroesophageal reflux Negative for peptic ulcer disease. 3 mm calcification to the left of L2-3 disc space is new since yesterday. Question symptoms of left renal colic   TTE 08/26/20: IMPRESSIONS   1. Left ventricular ejection fraction, by estimation, is 65 to 70%. The  left ventricle has normal function. The left ventricle has no regional  wall motion abnormalities. Left ventricular diastolic  parameters were  normal.   2. Right ventricular systolic function is normal. The right ventricular  size is normal.   3. The mitral valve is normal in structure. Trivial mitral valve  regurgitation.   4. The aortic valve is normal in structure. Aortic valve regurgitation is  not visualized.   CT A/P w/contrast 09/25/21: IMPRESSION: 1. Known uterine fibroid that appears grossly stable in size better evaluated on pelvic ultrasound 10/08/2016. 2. Hepatomegaly. 3. Constipation. 4. Otherwise no acute intra-abdominal or intrapelvic abnormality.   Speech therapy evaluation 05/27/21: Pt presents with a functional dysphagia that has a significant negative impact on her life - she avoids eating in restaurants; she limits her diet to a narrow selection of foods; she has lost weight.  During today's study, Ms. Pietro self-limited bolus sizes (solid foods ~1/2 tspn; liquids ~ 5 ml) and required encouragement to consume >10 ml sips of liquid or tablespoon sized solid boluses.  She verbalized her concerns about increasing bolus size and became tearful and apologetic and discussed her  fears related to swallowing.  Offered encouragement.  Imaging revealed normal biomechanics of the swallow with thorough mastication, timely swallow response, normal pharyngeal squeeze, reliable laryngeal vestibule closure, no penetration/aspiration, no obvious stasis in esophagus upon brief screen.  Ms. Vendetti identifed globus sensation during the study. We watched the study together in real time; we discussed the anatomical separation of the airway from the esophagus and discussed that the feeling of pressure when eating is related to her esophagus, but that her breathing is not compromised.  She was encouraged to record a few frames of the video as affirmation of the normal mechanics of her swallowing.  We discussed recommendations, and Ms. Zollner agreed she might benefit from OP SLP to address the functional nature of her  dysphagia. Recommend referral to [facility] neuro OP clinic to address the aforementioned condition.   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 - Non-bleeding gastric ulcers with a clean ulcer base (Forrest Class III). Biopsied. - Normal examined duodenum. - Biopsies were taken with a cold forceps for histology in the proximal esophagus and in the distal esophagus. Path: 1. Surgical [P], gastric antrum and gastric body - GASTRIC ANTRAL MUCOSA WITH MILD NONSPECIFIC REACTIVE GASTROPATHY - GASTRIC OXYNTIC MUCOSA WITH NO SPECIFIC HISTOPATHOLOGIC CHANGES - WARTHIN STARRY STAIN IS NEGATIVE FOR HELICOBACTER PYLORI 2. Surgical [P], distal esophagus - ESOPHAGEAL SQUAMOUS AND CARDIAC MUCOSA WITH REFLUX-ASSOCIATED CHANGES - NEGATIVE FOR INTESTINAL METAPLASIA OR DYSPLASIA 3. 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 - Normal stomach. - Normal examined duodenum. - No specimens collected.   Esophageal manometry 07/27/21: Normal.   Past Medical History:  Diagnosis Date   Anxiety    Arthritis    Breast cancer (HCC)    Chlamydia contact, treated    GERD (gastroesophageal reflux disease)    Hyperthyroidism    Past Surgical History:  Procedure Laterality Date   AXILLARY SENTINEL NODE BIOPSY Right 12/01/2022   Procedure: RIGHT AXILLARY SENTINEL NODE BIOPSY;  Surgeon: Abigail Miyamoto, MD;  [facility];  Service: General;  Laterality: Right;   BREAST BIOPSY Right 10/02/2022   MM RT BREAST BX W LOC DEV 1ST LESION IMAGE BX SPEC STEREO GUIDE 10/02/2022 [facility]   BREAST BIOPSY  11/10/2022   MM RT RADIOACTIVE SEED LOC MAMMO GUIDE 11/10/2022 [facility]   BREAST LUMPECTOMY WITH RADIOACTIVE SEED LOCALIZATION Right 11/13/2022   Procedure: RIGHT BREAST  LUMPECTOMY WITH RADIOACTIVE SEED LOCALIZATION;  Surgeon: Abigail Miyamoto, MD;  Location: [facility];  Service: General;  Laterality: Right;   ENDOMETRIAL ABLATION N/A 04/25/2017   Procedure: ENDOMETRIAL ABLATION With NOVASURE;  Surgeon: Adam Phenix, MD;  Location: [facility];  Service: Gynecology;  Laterality: N/A;   ESOPHAGEAL MANOMETRY N/A 07/27/2021   Procedure: ESOPHAGEAL MANOMETRY (EM);  Surgeon: Imogene Burn, MD;  Location: [facility];  Service: Gastroenterology;  Laterality: N/A;   PORTACATH PLACEMENT N/A 12/01/2022   Procedure: INSERTION PORT-A-CATH WITH ULTRASOUND GUIDANCE;  Surgeon: Abigail Miyamoto, MD;  Location: [facility];  Service: General;  Laterality: N/A;   RE-EXCISION OF BREAST CANCER,SUPERIOR MARGINS Right 12/01/2022   Procedure: RE-EXCISION OF RIGHT BREAST CANCER;  Surgeon: Abigail Miyamoto, MD;  Location: [facility];  Service: General;  Laterality: Right;   TUBAL LIGATION  2003       Current Medications, Allergies, Past Medical History, Past Surgical History,  Family History and Social History were reviewed in [employer] record.   Review of Systems:   Constitutional: Negative for fever, sweats, chills or weight loss.  Respiratory: Negative for shortness of breath.   Cardiovascular: Negative for chest pain, palpitations and leg swelling.  Gastrointestinal: See HPI.  Musculoskeletal: Negative for back pain or muscle aches.  Neurological: Negative for dizziness, headaches or paresthesias.    Physical Exam: There were no vitals taken for this visit. General: in no acute distress. Head: Normocephalic and atraumatic. Eyes: No scleral icterus. Conjunctiva pink . Ears: Normal auditory acuity. Mouth: Dentition intact. No ulcers or lesions.  Lungs: Clear throughout to auscultation. Heart: Regular rate and rhythm, no murmur. Abdomen: Soft, nontender and nondistended. No masses or hepatomegaly. Normal bowel sounds x 4  quadrants.  Rectal: Deferred.  Musculoskeletal: Symmetrical with no gross deformities. Extremities: No edema. Neurological: Alert oriented x 4. No focal deficits.  Psychological: Alert and cooperative. Normal mood and affect  Assessment and Recommendations: ***

## 2023-08-09 ENCOUNTER — Ambulatory Visit (INDEPENDENT_AMBULATORY_CARE_PROVIDER_SITE_OTHER): Payer: Medicaid Other | Admitting: Nurse Practitioner

## 2023-08-09 ENCOUNTER — Encounter: Payer: Self-pay | Admitting: Nurse Practitioner

## 2023-08-09 VITALS — BP 112/82 | HR 90 | Wt 209.0 lb

## 2023-08-09 DIAGNOSIS — K5909 Other constipation: Secondary | ICD-10-CM

## 2023-08-09 DIAGNOSIS — K219 Gastro-esophageal reflux disease without esophagitis: Secondary | ICD-10-CM

## 2023-08-09 DIAGNOSIS — C50411 Malignant neoplasm of upper-outer quadrant of right female breast: Secondary | ICD-10-CM

## 2023-08-09 MED ORDER — SUCRALFATE 1 GM/10ML PO SUSP: 1.0000 g | Freq: Four times a day (QID) | ORAL | 0 refills | Status: DC

## 2023-08-09 NOTE — Patient Instructions (Addendum)
 You have been scheduled for an endoscopy. Please follow written instructions given to you at your visit today.  If you use inhalers (even only as needed), please bring them with you on the day of your procedure. __________________________________________________________________________  We have sent the following medications to your pharmacy for you to pick up at your convenience: Sucralfate   Due to recent changes in healthcare laws, you may see the results of your imaging and laboratory studies on MyChart before your provider has had a chance to review them.  We understand that in some cases there may be results that are confusing or concerning to you. Not all laboratory results come back in the same time frame and the provider may be waiting for multiple results in order to interpret others.  Please give [place] 48 hours in order for your provider to thoroughly review all the results before contacting the office for clarification of your results.   Thank you for trusting me with your gastrointestinal care!   Alcide Evener, CRNP

## 2023-08-09 NOTE — Progress Notes (Signed)
 I agree with the assessment and plan as outlined by Ms. Riley Kill. Okay to proceed with EGD.

## 2023-08-13 ENCOUNTER — Ambulatory Visit: Payer: Medicaid Other | Admitting: Family Medicine

## 2023-08-13 ENCOUNTER — Telehealth: Payer: Self-pay | Admitting: *Deleted

## 2023-08-13 NOTE — Telephone Encounter (Signed)
-----   Message from Noreene Filbert sent at 08/13/2023  3:12 PM EST ----- Please make sure patient knows scans look good, no signs of cancer. ----- Message ----- From: Leory Plowman, Rad Results In Sent: 08/06/2023   2:58 PM EST To: Loa Socks, NP

## 2023-08-13 NOTE — Telephone Encounter (Signed)
Per Lindsey [name],DNP, called pt with message below. Pt verbalized understanding 

## 2023-08-20 ENCOUNTER — Encounter: Payer: Self-pay | Admitting: Family Medicine

## 2023-08-20 ENCOUNTER — Ambulatory Visit: Payer: Medicaid Other | Attending: Family Medicine | Admitting: Family Medicine

## 2023-08-20 ENCOUNTER — Telehealth: Payer: Self-pay

## 2023-08-20 VITALS — BP 105/73 | HR 86 | Resp 100 | Ht 66.0 in | Wt 212.2 lb

## 2023-08-20 DIAGNOSIS — R002 Palpitations: Secondary | ICD-10-CM

## 2023-08-20 DIAGNOSIS — F410 Panic disorder [episodic paroxysmal anxiety] without agoraphobia: Secondary | ICD-10-CM

## 2023-08-20 DIAGNOSIS — C50411 Malignant neoplasm of upper-outer quadrant of right female breast: Secondary | ICD-10-CM

## 2023-08-20 DIAGNOSIS — M5412 Radiculopathy, cervical region: Secondary | ICD-10-CM | POA: Diagnosis not present

## 2023-08-20 DIAGNOSIS — Z171 Estrogen receptor negative status [ER-]: Secondary | ICD-10-CM

## 2023-08-20 DIAGNOSIS — F32A Depression, unspecified: Secondary | ICD-10-CM

## 2023-08-20 DIAGNOSIS — F419 Anxiety disorder, unspecified: Secondary | ICD-10-CM

## 2023-08-20 DIAGNOSIS — G4709 Other insomnia: Secondary | ICD-10-CM

## 2023-08-20 MED ORDER — CYCLOBENZAPRINE HCL 10 MG PO TABS: 10.0000 mg | ORAL_TABLET | Freq: Two times a day (BID) | ORAL | 1 refills | Status: DC | PRN

## 2023-08-20 MED ORDER — TOPIRAMATE 50 MG PO TABS: 50.0000 mg | ORAL_TABLET | Freq: Two times a day (BID) | ORAL | 1 refills | Status: DC

## 2023-08-20 MED ORDER — RIZATRIPTAN BENZOATE 10 MG PO TABS: 10.0000 mg | ORAL_TABLET | ORAL | 2 refills | Status: DC | PRN

## 2023-08-20 MED ORDER — METOPROLOL SUCCINATE ER 25 MG PO TB24: 12.5000 mg | ORAL_TABLET | Freq: Every day | ORAL | 1 refills | Status: DC

## 2023-08-20 MED ORDER — HYDROXYZINE HCL 25 MG PO TABS: ORAL_TABLET | ORAL | 1 refills | Status: DC

## 2023-08-20 MED ORDER — VENLAFAXINE HCL ER 75 MG PO CP24: 75.0000 mg | ORAL_CAPSULE | Freq: Every day | ORAL | 1 refills | Status: DC

## 2023-08-20 NOTE — Telephone Encounter (Signed)
 At the request of Dr Alvis Lemmings, I met with the patient when she was in the clinic today.  She expressed the desire for in-person counseling now that she has completed her cancer treatments.    I offered to refer his to Jenel Lucks, LCSW/ VBCI and she said she has already spoken with [facility] but she would like in-person counseling.  I explained to her that I can ask Jasmine to contact her for recommendations for in person counseling and she was in agreement.  I told her to please call me with any questions.

## 2023-08-20 NOTE — Progress Notes (Signed)
 Subjective:  Patient ID: Laurie Bell, female    DOB: [birth year]  Age: 43 y.o. MRN: [id number]  CC: No chief complaint on file.     Discussed the use of AI scribe software for clinical note transcription with the patient, who gave verbal consent to proceed.  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 presents with persistent headaches and anxiety.  Despite being on Topamax 25mg  twice daily for headache prophylaxis, she reports experiencing headaches three to four times a week, with one recent episode so severe it confined her to bed for a day. The patient also reports significant anxiety, describing it as 'racing' and leading to panic attacks. This anxiety has worsened since her cancer diagnosis and is accompanied by insomnia, despite taking Ambien.  He was prescribed hydroxyzine in the past.  The patient also reports muscle tension in the neck, for which she was previously undergoing physical therapy and was prescribed cyclobenzaprine which she has run out of.    Past Medical History:  Diagnosis Date   Anxiety    Arthritis    Breast cancer (HCC)    Chlamydia contact, treated    GERD (gastroesophageal reflux disease)    Hyperthyroidism     Past Surgical History:  Procedure Laterality Date   AXILLARY SENTINEL NODE BIOPSY Right 12/01/2022   Procedure: RIGHT AXILLARY SENTINEL NODE BIOPSY;  Surgeon: Abigail Miyamoto, MD;  [facility];  Service: General;  Laterality: Right;   BREAST BIOPSY Right 10/02/2022   MM RT BREAST BX W LOC DEV 1ST LESION IMAGE BX SPEC STEREO GUIDE 10/02/2022 [facility]   BREAST BIOPSY  11/10/2022   MM RT RADIOACTIVE SEED LOC MAMMO GUIDE 11/10/2022 [facility]   BREAST LUMPECTOMY WITH  RADIOACTIVE SEED LOCALIZATION Right 11/13/2022   Procedure: RIGHT BREAST LUMPECTOMY WITH RADIOACTIVE SEED LOCALIZATION;  Surgeon: Abigail Miyamoto, MD;  Location: [facility];  Service: General;  Laterality: Right;   ENDOMETRIAL ABLATION N/A 04/25/2017   Procedure: ENDOMETRIAL ABLATION With NOVASURE;  Surgeon: Adam Phenix, MD;  Location: [facility];  Service: Gynecology;  Laterality: N/A;   ESOPHAGEAL MANOMETRY N/A 07/27/2021   Procedure: ESOPHAGEAL MANOMETRY (EM);  Surgeon: Imogene Burn, MD;  [facility];  Service: Gastroenterology;  Laterality: N/A;   PORTACATH PLACEMENT N/A 12/01/2022   Procedure: INSERTION PORT-A-CATH WITH ULTRASOUND GUIDANCE;  Surgeon: Abigail Miyamoto, MD;  [facility];  Service: General;  Laterality: N/A;   RE-EXCISION OF BREAST CANCER,SUPERIOR MARGINS Right 12/01/2022   Procedure: RE-EXCISION OF RIGHT BREAST CANCER;  Surgeon: Abigail Miyamoto, MD;  [facility];  Service: General;  Laterality: Right;   TUBAL LIGATION  2003    Family History  Problem Relation Age of Onset   Hypertension Mother    Hypertension Maternal Aunt    Breast cancer Maternal Grandmother        dx > 50   Stomach cancer Maternal Grandfather        dx > 50   Breast cancer Paternal Grandmother        dx > 50   Esophageal cancer Neg Hx    Rectal cancer Neg Hx    Colon cancer Neg Hx     Social History   Socioeconomic History   Marital status: Single  Spouse name: Not on file   Number of children: 2   Years of education: Not on file   Highest education level: Not on file  Occupational History   Occupation: Event organiser: [employer]  Tobacco Use   Smoking status: Never   Smokeless tobacco: Never  Vaping Use   Vaping status: Never Used  Substance and Sexual Activity   Alcohol use: No   Drug use: Not Currently    Types: Marijuana    Comment: quit 2018   Sexual activity: Not Currently    Birth control/protection: Surgical   Other Topics Concern   Not on file  Social History Narrative   ** Merged History Encounter **       Right Handed  Lives in a one story home    Social Drivers of Health   Financial Resource Strain: Medium Risk (11/03/2021)   Overall Financial Resource Strain (CARDIA)    Difficulty of Paying Living Expenses: Somewhat hard  Food Insecurity: No Food Insecurity (05/22/2023)   Hunger Vital Sign    Worried About Running Out of Food in the Last Year: Never true    Ran Out of Food in the Last Year: Never true  Transportation Needs: Unmet Transportation Needs (05/22/2023)   PRAPARE - Transportation    Lack of Transportation (Medical): Yes    Lack of Transportation (Non-Medical): Yes  Physical Activity: Sufficiently Active (11/03/2021)   Exercise Vital Sign    Days of Exercise per Week: 3 days    Minutes of Exercise per Session: 50 min  Stress: Stress Concern Present (11/03/2021)   [employer] of Occupational Health - Occupational Stress Questionnaire    Feeling of Stress : Very much  Social Connections: Moderately Isolated (11/03/2021)   Social Connection and Isolation Panel [NHANES]    Frequency of Communication with Friends and Family: Once a week    Frequency of Social Gatherings with Friends and Family: Once a week    Attends Religious Services: More than 4 times per year    Active Member of [employer] or Organizations: No    Attends Engineer, structural: Never    Marital Status: Living with partner    Allergies  Allergen Reactions   Latex Other (See Comments)    Burn in vaginal area with latex condoms   Tramadol Other (See Comments)    States "it messes me up" no further info    Outpatient Medications Prior to Visit  Medication Sig Dispense Refill   acetaminophen (TYLENOL) 500 MG tablet Take 1,000 mg by mouth every 6 (six) hours as needed for moderate pain.     albuterol (VENTOLIN HFA) 108 (90 Base) MCG/ACT inhaler TAKE 2 PUFFS BY MOUTH EVERY 6 HOURS AS NEEDED FOR  WHEEZE OR SHORTNESS OF BREATH (Patient taking differently: Inhale 2 puffs into the lungs every 6 (six) hours as needed for wheezing or shortness of breath.) 54 each 0   cyclobenzaprine (FLEXERIL) 10 MG tablet TAKE 1 TABLET BY MOUTH AT BEDTIME AS NEEDED FOR MUSCLE SPASMS (Patient taking differently: Take 10 mg by mouth 3 (three) times daily as needed for muscle spasms.) 30 tablet 1   esomeprazole (NEXIUM) 40 MG capsule Take 1 capsule (40 mg total) by mouth 2 (two) times daily before a meal. 60 capsule 4   gabapentin (NEURONTIN) 400 MG capsule TAKE 1 CAPSULE BY MOUTH IN AM AND NOON, AND 2 CAPSULE BY MOUTH AT BEDTIME 56 capsule 0   hydrOXYzine (ATARAX) 25 MG tablet TAKE 1  TABLET(25 MG) BY MOUTH THREE TIMES DAILY 270 tablet 1   linaclotide (LINZESS) 145 MCG CAPS capsule Take 1 capsule (145 mcg total) by mouth daily before breakfast. (Patient taking differently: Take 145 mcg by mouth as needed.) 30 capsule 5   methimazole (TAPAZOLE) 5 MG tablet Take 1 tablet (5 mg total) by mouth as directed. 1 tablet Monday through Thursday,, skip Friday, Saturdays and  Sundays 52 tablet 3   metoprolol succinate (TOPROL-XL) 25 MG 24 hr tablet Take 0.5 tablets (12.5 mg total) by mouth daily. For increased heart rate 45 tablet 1   naproxen (NAPROSYN) 500 MG tablet TAKE 1 TABLET BY MOUTH TWICE A DAY AS NEEDED FOR PAIN 60 tablet 1   oxyCODONE-acetaminophen (PERCOCET/ROXICET) 5-325 MG tablet Take 1 tablet by mouth 4 (four) times daily.     sucralfate (CARAFATE) 1 GM/10ML suspension Take 10 mLs (1 g total) by mouth 4 (four) times daily. Take 2 teaspoons with each meal and 2 teaspoons at bedtime. Do not take within 2 hours of any other medications 400 mL 0   topiramate (TOPAMAX) 25 MG tablet Take 1 tablet (25 mg total) by mouth daily for 7 days, THEN 1 tablet (25 mg total) 2 (two) times daily. 67 tablet 2   Vitamin D, Ergocalciferol, (DRISDOL) 1.25 MG (50000 UNIT) CAPS capsule Take 50,000 Units by mouth once a week.     zolpidem  (AMBIEN) 5 MG tablet Take 1 tablet (5 mg total) by mouth at bedtime as needed for sleep. 30 tablet 3   No facility-administered medications prior to visit.     ROS Review of Systems  Constitutional:  Positive for fatigue. Negative for activity change and appetite change.  HENT:  Negative for sinus pressure and sore throat.   Respiratory:  Negative for chest tightness, shortness of breath and wheezing.   Cardiovascular:  Negative for chest pain and palpitations.  Gastrointestinal:  Negative for abdominal distention, abdominal pain and constipation.  Genitourinary: Negative.   Musculoskeletal:        See HPI  Neurological:  Positive for headaches.  Psychiatric/Behavioral:  Positive for sleep disturbance. Negative for behavioral problems and dysphoric mood.        Positive for anxiety    Objective:  There were no vitals taken for this visit.     08/09/2023    9:02 AM 07/23/2023    3:30 AM 07/23/2023    2:45 AM  BP/Weight  Systolic BP 112 139 122  Diastolic BP 82 90 85  Wt. (Lbs) 209    BMI 33.73 kg/m2        Physical Exam Constitutional:      Appearance: She is well-developed.  Cardiovascular:     Rate and Rhythm: Normal rate.     Heart sounds: Normal heart sounds. No murmur heard. Pulmonary:     Effort: Pulmonary effort is normal.     Breath sounds: Normal breath sounds. No wheezing or rales.  Chest:     Chest wall: No tenderness.  Abdominal:     General: Bowel sounds are normal. There is no distension.     Palpations: Abdomen is soft. There is no mass.     Tenderness: There is no abdominal tenderness.  Musculoskeletal:        General: Normal range of motion.     Right lower leg: No edema.     Left lower leg: No edema.  Neurological:     Mental Status: She is alert and oriented to person, place, and time.  Psychiatric:        Mood and Affect: Mood normal.        Latest Ref Rng & Units 07/23/2023   12:31 AM 07/05/2023    2:48 PM 05/08/2023   11:21 AM  CMP   Glucose 70 - 99 mg/dL 161  096  045   BUN 6 - 20 mg/dL 16  9  9    Creatinine 0.44 - 1.00 mg/dL 4.09  8.11  9.14   Sodium 135 - 145 mmol/L 138  140  140   Potassium 3.5 - 5.1 mmol/L 3.4  3.6  3.3   Chloride 98 - 111 mmol/L 105  105  106   CO2 22 - 32 mmol/L 24  26  29    Calcium 8.9 - 10.3 mg/dL 9.4  9.1  9.6   Total Protein 6.5 - 8.1 g/dL  7.5  7.3   Total Bilirubin 0.0 - 1.2 mg/dL  0.5  0.5   Alkaline Phos 38 - 126 U/L  151  143   AST 15 - 41 U/L  28  39   ALT 0 - 44 U/L  28  62     Lipid Panel  No results found for: "CHOL", "TRIG", "HDL", "CHOLHDL", "VLDL", "LDLCALC", "LDLDIRECT"  CBC    Component Value Date/Time   WBC 7.1 07/23/2023 0031   RBC 4.44 07/23/2023 0031   HGB 12.0 07/23/2023 0031   HGB 12.6 05/08/2023 1121   HCT 36.6 07/23/2023 0031   PLT 246 07/23/2023 0031   PLT 238 05/08/2023 1121   MCV 82.4 07/23/2023 0031   MCH 27.0 07/23/2023 0031   MCHC 32.8 07/23/2023 0031   RDW 13.6 07/23/2023 0031   LYMPHSABS 1.4 07/05/2023 1448   MONOABS 0.5 07/05/2023 1448   EOSABS 0.3 07/05/2023 1448   BASOSABS 0.0 07/05/2023 1448    Lab Results  Component Value Date   HGBA1C 5.2 05/08/2023       Assessment & Plan Migraine Headaches Partial response to Topamax 25mg  twice daily. Severe headache yesterday. Headaches occurring 3-4 times per week. -Increase Topamax to 50mg  twice daily. -Add Maxalt for breakthrough -Consider referral to neurology if no improvement.  Anxiety Increased anxiety since cancer diagnosis. Panic attacks. Hydroxyzine as needed not effective. -Start Effexor for daily anxiety management. -Refer to therapy/counseling.  She has met with Prudencio Pair with LCSW in the past and I have had the case manager contact her with Leavy Cella. -Consider referral to psychiatry if above is ineffective  Insomnia Difficulty sleeping. Ambien only effective for 45 minutes to an hour. Limited coverage by Medicaid -Continue Ambien as prescribed by oncology -Consider  discussing with prescribing doctor about Medicare coverage issues.  Muscle Tension Persistent muscle tension despite physical therapy. Previous benefit from cyclobenzaprine. -Prescribe cyclobenzaprine for muscle tension. -Advise patient to follow up with physical therapy regarding continuation of sessions.  General Health Maintenance -Consider water aerobics for fatigue and muscle tension. -Check cholesterol at next visit.      No orders of the defined types were placed in this encounter.   Follow-up: No follow-ups on file.       Hoy Register, MD, FAAFP. [facility] [place], [place] [phone]   08/20/2023, 9:10 AM

## 2023-08-20 NOTE — Patient Instructions (Signed)
 VISIT SUMMARY:  During today's visit, we discussed your persistent headaches, anxiety, insomnia, and muscle tension. We reviewed your current medications and made some adjustments to better manage your symptoms. We also talked about some general health maintenance recommendations to help improve your overall well-being.  YOUR PLAN:  -MIGRAINE HEADACHES: Migraine headaches are severe headaches often accompanied by nausea and sensitivity to light and sound. We will increase your Topamax dosage to 50mg  twice daily and add Maxalt for acute headache relief. If your headaches do not improve, we may refer you to a neurologist.  -ANXIETY: Anxiety is a feeling of worry or fear that can be overwhelming. We will start you on Effexor for daily anxiety management and refer you to therapy or counseling. If needed, we may also refer you to a psychiatrist.  -INSOMNIA: Insomnia is difficulty falling or staying asleep. You will continue taking Ambien as prescribed. We will also discuss Medicare coverage issues with your prescribing doctor.  -MUSCLE TENSION: Muscle tension is the feeling of tightness in the muscles. We will prescribe cyclobenzaprine to help with this and advise you to follow up with physical therapy regarding your sessions.  -GENERAL HEALTH MAINTENANCE: For your general health, consider water aerobics to help with fatigue and muscle tension. We will also check your cholesterol at your next visit.  INSTRUCTIONS:  Please follow up with [place] if your symptoms do not improve or if you experience any side effects from the new medications. We will also need to check your cholesterol at your next visit.

## 2023-08-23 ENCOUNTER — Other Ambulatory Visit

## 2023-08-24 ENCOUNTER — Encounter: Payer: Self-pay | Admitting: Hematology and Oncology

## 2023-08-24 ENCOUNTER — Other Ambulatory Visit (HOSPITAL_COMMUNITY): Payer: Self-pay

## 2023-08-27 ENCOUNTER — Encounter: Payer: Self-pay | Admitting: Licensed Clinical Social Worker

## 2023-08-27 ENCOUNTER — Other Ambulatory Visit (HOSPITAL_COMMUNITY): Payer: Self-pay

## 2023-08-27 ENCOUNTER — Telehealth: Payer: Self-pay | Admitting: *Deleted

## 2023-08-27 NOTE — Progress Notes (Unsigned)
 Complex Care Management Care Guide Note  08/27/2023 Name: LEIALOHA HANNA MRN: [id number] DOB: 05-26-1981  Rondel Baton Knoblock is a 43 y.o. year old female who is a primary care patient of Hoy Register, MD and is actively engaged with the care management team. I reached out to [name] by phone today to assist with re-scheduling  with the Licensed Clinical Child psychotherapist.  Follow up plan: Unsuccessful telephone outreach attempt made. A HIPAA compliant phone message was left for the patient providing contact information and requesting a return call.  Gwenevere Ghazi  [facility]  [facility], [facility] Guide  Direct Dial: [phone]  Fax [phone]

## 2023-08-28 ENCOUNTER — Encounter (HOSPITAL_COMMUNITY): Payer: Self-pay | Admitting: Emergency Medicine

## 2023-08-28 ENCOUNTER — Emergency Department (HOSPITAL_COMMUNITY)

## 2023-08-28 ENCOUNTER — Other Ambulatory Visit: Payer: Self-pay

## 2023-08-28 ENCOUNTER — Emergency Department (HOSPITAL_COMMUNITY)
Admission: EM | Admit: 2023-08-28 | Discharge: 2023-08-29 | Disposition: A | Discharge status: Home / Self Care | Attending: Emergency Medicine | Admitting: Emergency Medicine

## 2023-08-28 DIAGNOSIS — Z853 Personal history of malignant neoplasm of breast: Secondary | ICD-10-CM | POA: Diagnosis not present

## 2023-08-28 DIAGNOSIS — Z91040 Latex allergy status: Secondary | ICD-10-CM | POA: Insufficient documentation

## 2023-08-28 DIAGNOSIS — R0789 Other chest pain: Secondary | ICD-10-CM | POA: Diagnosis not present

## 2023-08-28 DIAGNOSIS — E876 Hypokalemia: Secondary | ICD-10-CM | POA: Insufficient documentation

## 2023-08-28 DIAGNOSIS — R0602 Shortness of breath: Secondary | ICD-10-CM | POA: Insufficient documentation

## 2023-08-28 DIAGNOSIS — R42 Dizziness and giddiness: Secondary | ICD-10-CM | POA: Insufficient documentation

## 2023-08-28 DIAGNOSIS — R079 Chest pain, unspecified: Secondary | ICD-10-CM | POA: Diagnosis present

## 2023-08-28 LAB — CBC
HCT: 39 % (ref 36.0–46.0)
Hemoglobin: 12.8 g/dL (ref 12.0–15.0)
MCH: 27.4 pg (ref 26.0–34.0)
MCHC: 32.8 g/dL (ref 30.0–36.0)
MCV: 83.3 fL (ref 80.0–100.0)
Platelets: 260 10*3/uL (ref 150–400)
RBC: 4.68 MIL/uL (ref 3.87–5.11)
RDW: 14.2 % (ref 11.5–15.5)
WBC: 8.3 10*3/uL (ref 4.0–10.5)
nRBC: 0 % (ref 0.0–0.2)

## 2023-08-28 LAB — D-DIMER, QUANTITATIVE: D-Dimer, Quant: <0.27 ug{FEU}/mL (ref 0.00–0.50)

## 2023-08-28 LAB — RESP PANEL BY RT-PCR (RSV, FLU A&B, COVID)  RVPGX2
Influenza A by PCR: NEGATIVE
Influenza B by PCR: NEGATIVE
Resp Syncytial Virus by PCR: NEGATIVE
SARS Coronavirus 2 by RT PCR: NEGATIVE

## 2023-08-28 LAB — TROPONIN I (HIGH SENSITIVITY)
Troponin I (High Sensitivity): 5 ng/L (ref <18)
Troponin I (High Sensitivity): 5 ng/L (ref <18)

## 2023-08-28 LAB — BASIC METABOLIC PANEL
Anion gap: 9 (ref 5–15)
BUN: 11 mg/dL (ref 6–20)
CO2: 25 mmol/L (ref 22–32)
Calcium: 9.5 mg/dL (ref 8.9–10.3)
Chloride: 103 mmol/L (ref 98–111)
Creatinine, Ser: 0.96 mg/dL (ref 0.44–1.00)
GFR, Estimated: >60 mL/min (ref >60)
Glucose, Bld: 103 mg/dL — ABNORMAL HIGH (ref 70–99)
Potassium: 3 mmol/L — ABNORMAL LOW (ref 3.5–5.1)
Sodium: 137 mmol/L (ref 135–145)

## 2023-08-28 LAB — URINALYSIS, ROUTINE W REFLEX MICROSCOPIC
Bilirubin Urine: NEGATIVE
Glucose, UA: NEGATIVE mg/dL
Hgb urine dipstick: NEGATIVE
Ketones, ur: NEGATIVE mg/dL
Leukocytes,Ua: NEGATIVE
Nitrite: NEGATIVE
Protein, ur: NEGATIVE mg/dL
Specific Gravity, Urine: 1.011 (ref 1.005–1.030)
pH: 6 (ref 5.0–8.0)

## 2023-08-28 LAB — HCG, SERUM, QUALITATIVE: Preg, Serum: NEGATIVE

## 2023-08-28 MED ORDER — POTASSIUM CHLORIDE CRYS ER 20 MEQ PO TBCR
40.0000 meq | EXTENDED_RELEASE_TABLET | Freq: Once | ORAL | Status: AC
  Administered 2023-08-28: 40 meq via ORAL
  Filled 2023-08-28: qty 2

## 2023-08-28 NOTE — ED Triage Notes (Addendum)
 Patient comes in pov.  Port removed in [place].  Sunday started having chest pain, shortness of breath, dizziness, left upper quadrant pain. Heart racing per patient. Bilateral leg pain and calves, Sent over for possible PE workup  Patient states possible uti as well

## 2023-08-28 NOTE — ED Provider Notes (Signed)
 [place] EMERGENCY DEPARTMENT AT [facility] Provider Note   CSN: [id number] Arrival date & time: 08/28/23  1747     History Chief Complaint  Patient presents with   Chest Pain   Shortness of Breath    Laurie Bell is a 43 y.o. female.  Patient with past history significant for breast cancer, hypokalemia, GERD presents ED today with concerns of chest pain shortness of breath.  States that she has been having intermittent episodes of shortness of breath and chest pain starting Sunday, 2 days ago.  Does [place] some occasional dizziness with this.  Feels like she has had her heart racing with periods of diaphoresis.  No recent fever chills or bodyaches as far she has noted. Recently finished chemotherapy for breast cancer back in January and port was removed in February. Endorsing some pain in the site that port was removed.   Chest Pain Associated symptoms: shortness of breath   Shortness of Breath Associated symptoms: chest pain        Home Medications Prior to Admission medications   Medication Sig Start Date End Date Taking? Authorizing Provider  acetaminophen (TYLENOL) 500 MG tablet Take 1,000 mg by mouth every 6 (six) hours as needed for moderate pain.    [provider]  albuterol (VENTOLIN HFA) 108 (90 Base) MCG/ACT inhaler TAKE 2 PUFFS BY MOUTH EVERY 6 HOURS AS NEEDED FOR WHEEZE OR SHORTNESS OF BREATH Patient taking differently: Inhale 2 puffs into the lungs every 6 (six) hours as needed for wheezing or shortness of breath. 12/22/22   Hoy Register, MD  cyclobenzaprine (FLEXERIL) 10 MG tablet Take 1 tablet (10 mg total) by mouth 2 (two) times daily as needed for muscle spasms. TAKE 1 TABLET BY MOUTH AT BEDTIME AS NEEDED FOR MUSCLE SPASMS 08/20/23   Hoy Register, MD  esomeprazole (NEXIUM) 40 MG capsule Take 1 capsule (40 mg total) by mouth 2 (two) times daily before a meal. 07/04/23   Imogene Burn, MD  gabapentin (NEURONTIN) 400 MG capsule TAKE 1  CAPSULE BY MOUTH IN AM AND NOON, AND 2 CAPSULE BY MOUTH AT BEDTIME 07/11/23   Serena Croissant, MD  hydrOXYzine (ATARAX) 25 MG tablet TAKE 1 TABLET(25 MG) BY MOUTH THREE TIMES DAILY 08/20/23   Hoy Register, MD  linaclotide (LINZESS) 145 MCG CAPS capsule Take 1 capsule (145 mcg total) by mouth daily before breakfast. Patient taking differently: Take 145 mcg by mouth as needed. 05/19/22   Imogene Burn, MD  methimazole (TAPAZOLE) 5 MG tablet Take 1 tablet (5 mg total) by mouth as directed. 1 tablet Monday through Thursday,, skip Friday, Saturdays and  Sundays 04/03/23   Shamleffer, Konrad Dolores, MD  metoprolol succinate (TOPROL-XL) 25 MG 24 hr tablet Take 0.5 tablets (12.5 mg total) by mouth daily. For increased heart rate 08/20/23   Newlin, Enobong, MD  naproxen (NAPROSYN) 500 MG tablet TAKE 1 TABLET BY MOUTH TWICE A DAY AS NEEDED FOR PAIN 03/19/23   Hoy Register, MD  oxyCODONE-acetaminophen (PERCOCET/ROXICET) 5-325 MG tablet Take 1 tablet by mouth 4 (four) times daily. 07/13/23   [provider]  rizatriptan (MAXALT) 10 MG tablet Take 1 tablet (10 mg total) by mouth as needed for migraine. May repeat in 2 hours if needed 08/20/23   Hoy Register, MD  sucralfate (CARAFATE) 1 GM/10ML suspension Take 10 mLs (1 g total) by mouth 4 (four) times daily. Take 2 teaspoons with each meal and 2 teaspoons at bedtime. Do not take within 2 hours  of any other medications 08/09/23   Arnaldo Natal, NP  topiramate (TOPAMAX) 50 MG tablet Take 1 tablet (50 mg total) by mouth 2 (two) times daily. 08/20/23   Hoy Register, MD  venlafaxine XR (EFFEXOR XR) 75 MG 24 hr capsule Take 1 capsule (75 mg total) by mouth daily with breakfast. 08/20/23   Hoy Register, MD  Vitamin D, Ergocalciferol, (DRISDOL) 1.25 MG (50000 UNIT) CAPS capsule Take 50,000 Units by mouth once a week. 07/19/23   [provider]  zolpidem (AMBIEN) 5 MG tablet Take 1 tablet (5 mg total) by mouth at bedtime as needed for sleep.  06/08/23   Serena Croissant, MD  medroxyPROGESTERone (PROVERA) 10 MG tablet Take 2 tablets by mouth daily Patient not taking: Reported on 06/05/2019 02/06/18 02/02/20  Conan Bowens, MD      Allergies    Latex and Tramadol    Review of Systems   Review of Systems  Respiratory:  Positive for shortness of breath.   Cardiovascular:  Positive for chest pain.  All other systems reviewed and are negative.   Physical Exam Updated Vital Signs BP 128/86 (BP Location: Right Arm)   Pulse (!) 111   Temp 98.5 F (36.9 C) (Oral)   Resp 16   Ht 5\' 6"  (1.676 m)   Wt 94.8 kg   SpO2 99%   BMI 33.73 kg/m  Physical Exam Vitals and nursing note reviewed.  Constitutional:      General: She is not in acute distress.    Appearance: She is well-developed.  HENT:     Head: Normocephalic and atraumatic.  Eyes:     Conjunctiva/sclera: Conjunctivae normal.  Cardiovascular:     Rate and Rhythm: Normal rate and regular rhythm.     Heart sounds: No murmur heard. Pulmonary:     Effort: Pulmonary effort is normal. No respiratory distress.     Breath sounds: Normal breath sounds. No decreased breath sounds, wheezing, rhonchi or rales.  Chest:     Chest wall: Tenderness present.     Comments: Chest wall TTP along the port removal site. No appreciable deformity, erythema, or swelling present. Abdominal:     Palpations: Abdomen is soft.     Tenderness: There is no abdominal tenderness.  Musculoskeletal:        General: No swelling.     Cervical back: Neck supple.  Skin:    General: Skin is warm and dry.     Capillary Refill: Capillary refill takes less than 2 seconds.  Neurological:     Mental Status: She is alert.  Psychiatric:        Mood and Affect: Mood normal.     ED Results / Procedures / Treatments   Labs (all labs ordered are listed, but only abnormal results are displayed) Labs Reviewed  BASIC METABOLIC PANEL - Abnormal; Notable for the following components:      Result Value    Potassium 3.0 (*)    Glucose, Bld 103 (*)    All other components within normal limits  URINALYSIS, ROUTINE W REFLEX MICROSCOPIC - Abnormal; Notable for the following components:   Color, Urine STRAW (*)    All other components within normal limits  RESP PANEL BY RT-PCR (RSV, FLU A&B, COVID)  RVPGX2  CBC  HCG, SERUM, QUALITATIVE  D-DIMER, QUANTITATIVE  TROPONIN I (HIGH SENSITIVITY)  TROPONIN I (HIGH SENSITIVITY)    EKG None  Radiology DG Chest 2 View Result Date: 08/28/2023 CLINICAL DATA:  Chest pain, shortness  of breath, and dizziness. EXAM: CHEST - 2 VIEW COMPARISON:  07/22/2023 FINDINGS: The heart size and mediastinal contours are within normal limits. Both lungs are clear. The visualized skeletal structures are unremarkable. IMPRESSION: No active cardiopulmonary disease. Electronically Signed   By: Danae Orleans M.D.   On: 08/28/2023 19:24    Procedures Procedures    Medications Ordered in ED Medications  potassium chloride SA (KLOR-CON M) CR tablet 40 mEq (has no administration in time range)    ED Course/ Medical Decision Making/ A&P                                 Medical Decision Making Amount and/or Complexity of Data Reviewed Labs: ordered. Radiology: ordered.   This patient presents to the ED for concern of chest pain.  Differential diagnosis includes ACS, PE, pneumonia, bronchitis, COVID-19   Lab Tests:  I Ordered, and personally interpreted labs.  The pertinent results include: CBC unremarkable, GP with mild hypokalemia 3.0, troponin initially at 5 with delta troponin at***, hCG negative, urinalysis negative, respiratory panel***, D-dimer***   Imaging Studies ordered:  I ordered imaging studies including chest x-ray I independently visualized and interpreted imaging which showed no active cardiopulmonary disease I agree with the radiologist interpretation   Medicines ordered and prescription drug management:  I ordered medication including ***   for ***  Reevaluation of the patient after these medicines showed that the patient {resolved/improved/worsened:23923::"improved"} I have reviewed the patients home medicines and have made adjustments as needed   Problem List / ED Course:  Patient with past history significant for breast cancer, hypokalemia, GERD presents ED today with concerns of chest pain shortness of breath.  She reports that over the last 2 days she has had intermittent episodes of chest pain with some level of exertional shortness of breath.  She states that she recently finished chemotherapy for breast cancer back in January and had a port removed in February.  Endorsing some discomfort from the site where the port was removed.  Denies any recent hemoptysis, notable leg swelling, fever, chills or bodyaches. On exam, no abnormal heart or lung sounds.  No significant appreciable leg swelling.  Given increased risk factors and patient not acutely PERC negative and she is remaining tachycardic, obtain D-dimer for assessment of possible risk for PE.  Cardiac workup initiated.    Social Determinants of Health:  Past history of breast cancer  Final Clinical Impression(s) / ED Diagnoses Final diagnoses:  None    Rx / DC Orders ED Discharge Orders     None

## 2023-08-29 NOTE — Discharge Instructions (Addendum)
 You are seen in the emergency department today for concerns of shortness of breath and chest pain.  Your labs and imaging were thankfully reassuring with troponin level staying normal and your D-dimer level checking for any signs of clotting in the lungs being negative.  Your chest x-ray does not show any findings such as pneumonia.  You are negative for COVID-19, flu and RSV.  Given this reassuring workup, I would recommend following up with your primary care provider.  Return to the emergency department for any concerns of new or worsening symptoms.

## 2023-08-29 NOTE — Progress Notes (Signed)
 Complex Care Management Care Guide Note  08/29/2023 Name: Laurie Bell MRN: [id number] DOB: 1980/10/26  Laurie Bell is a 43 y.o. year old female who is a primary care patient of Hoy Register, MD and is actively engaged with the care management team. I reached out to [name] by phone today to assist with re-scheduling  with the Licensed Clinical Child psychotherapist.  Follow up plan: Telephone appointment with complex care management team member scheduled for:  4/3  Gwenevere Ghazi  [facility]  [facility], [facility] Guide  Direct Dial: [phone]  Fax [phone]

## 2023-08-30 ENCOUNTER — Encounter: Payer: Self-pay | Admitting: Internal Medicine

## 2023-08-30 ENCOUNTER — Emergency Department (HOSPITAL_COMMUNITY)

## 2023-08-30 ENCOUNTER — Encounter (HOSPITAL_COMMUNITY): Payer: Self-pay

## 2023-08-30 ENCOUNTER — Emergency Department (HOSPITAL_COMMUNITY)
Admission: EM | Admit: 2023-08-30 | Discharge: 2023-08-30 | Disposition: A | Discharge status: Home / Self Care | Attending: Emergency Medicine | Admitting: Emergency Medicine

## 2023-08-30 ENCOUNTER — Encounter: Payer: Self-pay | Admitting: Hematology and Oncology

## 2023-08-30 ENCOUNTER — Encounter: Payer: Self-pay | Admitting: Family Medicine

## 2023-08-30 DIAGNOSIS — Z853 Personal history of malignant neoplasm of breast: Secondary | ICD-10-CM | POA: Diagnosis not present

## 2023-08-30 DIAGNOSIS — K219 Gastro-esophageal reflux disease without esophagitis: Secondary | ICD-10-CM | POA: Insufficient documentation

## 2023-08-30 DIAGNOSIS — Z91040 Latex allergy status: Secondary | ICD-10-CM | POA: Insufficient documentation

## 2023-08-30 DIAGNOSIS — R002 Palpitations: Secondary | ICD-10-CM | POA: Diagnosis not present

## 2023-08-30 DIAGNOSIS — R079 Chest pain, unspecified: Secondary | ICD-10-CM | POA: Diagnosis present

## 2023-08-30 LAB — BASIC METABOLIC PANEL
Anion gap: 11 (ref 5–15)
BUN: 10 mg/dL (ref 6–20)
CO2: 25 mmol/L (ref 22–32)
Calcium: 9.7 mg/dL (ref 8.9–10.3)
Chloride: 104 mmol/L (ref 98–111)
Creatinine, Ser: 0.85 mg/dL (ref 0.44–1.00)
GFR, Estimated: >60 mL/min (ref >60)
Glucose, Bld: 97 mg/dL (ref 70–99)
Potassium: 3.3 mmol/L — ABNORMAL LOW (ref 3.5–5.1)
Sodium: 140 mmol/L (ref 135–145)

## 2023-08-30 LAB — CBC
HCT: 38.3 % (ref 36.0–46.0)
Hemoglobin: 12.4 g/dL (ref 12.0–15.0)
MCH: 26.7 pg (ref 26.0–34.0)
MCHC: 32.4 g/dL (ref 30.0–36.0)
MCV: 82.5 fL (ref 80.0–100.0)
Platelets: 271 10*3/uL (ref 150–400)
RBC: 4.64 MIL/uL (ref 3.87–5.11)
RDW: 14.3 % (ref 11.5–15.5)
WBC: 9.7 10*3/uL (ref 4.0–10.5)
nRBC: 0 % (ref 0.0–0.2)

## 2023-08-30 LAB — HCG, SERUM, QUALITATIVE: Preg, Serum: NEGATIVE

## 2023-08-30 LAB — TROPONIN I (HIGH SENSITIVITY): Troponin I (High Sensitivity): 6 ng/L (ref <18)

## 2023-08-30 MED ORDER — ALUM & MAG HYDROXIDE-SIMETH 200-200-20 MG/5ML PO SUSP
30.0000 mL | Freq: Once | ORAL | Status: AC
  Administered 2023-08-30: 30 mL via ORAL
  Filled 2023-08-30: qty 30

## 2023-08-30 NOTE — ED Provider Notes (Signed)
 [place] EMERGENCY DEPARTMENT AT [facility] Provider Note   CSN: [id number] Arrival date & time: 08/30/23  1921     History  Chief Complaint  Patient presents with   Dizziness    Laurie Bell is a 43 y.o. female.   Dizziness Patient presents with dizziness and chest pain.  Has been dealing with for a while.  Some shortness of breath.  Previous cancer history.  Previous workup for similar symptoms.  History of palpitations.  History of GERD.  Sees GI and is seeing cardiology.  Also worried because there is some pain at the site of her port had previously been.  States she felt dizzy with the palpitations today.    Past Medical History:  Diagnosis Date   Anxiety    Arthritis    Breast cancer (HCC)    Chlamydia contact, treated    GERD (gastroesophageal reflux disease)    Hyperthyroidism     Home Medications Prior to Admission medications   Medication Sig Start Date End Date Taking? Authorizing Provider  acetaminophen (TYLENOL) 500 MG tablet Take 1,000 mg by mouth every 6 (six) hours as needed for moderate pain.    [provider]  albuterol (VENTOLIN HFA) 108 (90 Base) MCG/ACT inhaler TAKE 2 PUFFS BY MOUTH EVERY 6 HOURS AS NEEDED FOR WHEEZE OR SHORTNESS OF BREATH Patient taking differently: Inhale 2 puffs into the lungs every 6 (six) hours as needed for wheezing or shortness of breath. 12/22/22   Hoy Register, MD  cyclobenzaprine (FLEXERIL) 10 MG tablet Take 1 tablet (10 mg total) by mouth 2 (two) times daily as needed for muscle spasms. TAKE 1 TABLET BY MOUTH AT BEDTIME AS NEEDED FOR MUSCLE SPASMS 08/20/23   Hoy Register, MD  esomeprazole (NEXIUM) 40 MG capsule Take 1 capsule (40 mg total) by mouth 2 (two) times daily before a meal. 07/04/23   Imogene Burn, MD  gabapentin (NEURONTIN) 400 MG capsule TAKE 1 CAPSULE BY MOUTH IN AM AND NOON, AND 2 CAPSULE BY MOUTH AT BEDTIME 07/11/23   Serena Croissant, MD  hydrOXYzine (ATARAX) 25 MG tablet TAKE 1  TABLET(25 MG) BY MOUTH THREE TIMES DAILY 08/20/23   Hoy Register, MD  linaclotide (LINZESS) 145 MCG CAPS capsule Take 1 capsule (145 mcg total) by mouth daily before breakfast. Patient taking differently: Take 145 mcg by mouth as needed. 05/19/22   Imogene Burn, MD  methimazole (TAPAZOLE) 5 MG tablet Take 1 tablet (5 mg total) by mouth as directed. 1 tablet Monday through Thursday,, skip Friday, Saturdays and  Sundays 04/03/23   Shamleffer, Konrad Dolores, MD  metoprolol succinate (TOPROL-XL) 25 MG 24 hr tablet Take 0.5 tablets (12.5 mg total) by mouth daily. For increased heart rate 08/20/23   Newlin, Enobong, MD  naproxen (NAPROSYN) 500 MG tablet TAKE 1 TABLET BY MOUTH TWICE A DAY AS NEEDED FOR PAIN 03/19/23   Hoy Register, MD  oxyCODONE-acetaminophen (PERCOCET/ROXICET) 5-325 MG tablet Take 1 tablet by mouth 4 (four) times daily. 07/13/23   [provider]  rizatriptan (MAXALT) 10 MG tablet Take 1 tablet (10 mg total) by mouth as needed for migraine. May repeat in 2 hours if needed 08/20/23   Hoy Register, MD  sucralfate (CARAFATE) 1 GM/10ML suspension Take 10 mLs (1 g total) by mouth 4 (four) times daily. Take 2 teaspoons with each meal and 2 teaspoons at bedtime. Do not take within 2 hours of any other medications 08/09/23   Arnaldo Natal, NP  topiramate (TOPAMAX)  50 MG tablet Take 1 tablet (50 mg total) by mouth 2 (two) times daily. 08/20/23   Hoy Register, MD  venlafaxine XR (EFFEXOR XR) 75 MG 24 hr capsule Take 1 capsule (75 mg total) by mouth daily with breakfast. 08/20/23   Hoy Register, MD  Vitamin D, Ergocalciferol, (DRISDOL) 1.25 MG (50000 UNIT) CAPS capsule Take 50,000 Units by mouth once a week. 07/19/23   [provider]  zolpidem (AMBIEN) 5 MG tablet Take 1 tablet (5 mg total) by mouth at bedtime as needed for sleep. 06/08/23   Serena Croissant, MD  medroxyPROGESTERone (PROVERA) 10 MG tablet Take 2 tablets by mouth daily Patient not taking: Reported on  06/05/2019 02/06/18 02/02/20  Conan Bowens, MD      Allergies    Latex and Tramadol    Review of Systems   Review of Systems  Neurological:  Positive for dizziness.    Physical Exam Updated Vital Signs BP 114/79   Pulse 79   Temp 97.9 F (36.6 C) (Oral)   Resp 19   SpO2 100%  Physical Exam Vitals and nursing note reviewed.  HENT:     Head: Atraumatic.  Cardiovascular:     Rate and Rhythm: Normal rate and regular rhythm.  Pulmonary:     Breath sounds: No wheezing.     Comments: Site of previous port nontender without erythema Abdominal:     Tenderness: There is no abdominal tenderness.  Skin:    Capillary Refill: Capillary refill takes less than 2 seconds.  Neurological:     Mental Status: She is alert and oriented to person, place, and time.     ED Results / Procedures / Treatments   Labs (all labs ordered are listed, but only abnormal results are displayed) Labs Reviewed  BASIC METABOLIC PANEL - Abnormal; Notable for the following components:      Result Value   Potassium 3.3 (*)    All other components within normal limits  CBC  HCG, SERUM, QUALITATIVE  TROPONIN I (HIGH SENSITIVITY)    EKG EKG Interpretation Date/Time:  Thursday August 30 2023 19:31:29 EDT Ventricular Rate:  84 PR Interval:  171 QRS Duration:  80 QT Interval:  386 QTC Calculation: 457 R Axis:   28  Text Interpretation: Sinus rhythm Ventricular bigeminy Anterior infarct, old Confirmed by Benjiman Core [phone]) on 08/30/2023 9:25:29 PM  Radiology DG Chest 2 View Result Date: 08/30/2023 CLINICAL DATA:  Chest pain and dizziness for several days, short of breath EXAM: CHEST - 2 VIEW COMPARISON:  08/28/2023 FINDINGS: The heart size and mediastinal contours are within normal limits. Both lungs are clear. The visualized skeletal structures are unremarkable. IMPRESSION: No active cardiopulmonary disease. Electronically Signed   By: Sharlet Salina M.D.   On: 08/30/2023 19:58     Procedures Procedures    Medications Ordered in ED Medications  alum & mag hydroxide-simeth (MAALOX/MYLANTA) 200-200-20 MG/5ML suspension 30 mL (30 mLs Oral Given 08/30/23 2204)    ED Course/ Medical Decision Making/ A&P                                 Medical Decision Making Amount and/or Complexity of Data Reviewed Labs: ordered. Radiology: ordered.  Risk OTC drugs.   Patient with multiple complaints.  Shortness of breath palpitations dizziness chest pain.  Workup reassuring.  Does have a history of GERD.  However on relatively maximized treatment already from GI.  Do not  think there is any medicines at this time but will need follow-up.  Will give GI cocktail here.  EKG did show some premature atrial contractions also had some PVCs.  On metoprolol.  Can follow-up with cardiology.  I would likely has a component of anxiety.  Workup reassuring.  Do not think we need further ER workup however.  Appears stable for discharge home.        Final Clinical Impression(s) / ED Diagnoses Final diagnoses:  Palpitations  Gastroesophageal reflux disease, unspecified whether esophagitis present    Rx / DC Orders ED Discharge Orders          Ordered    Ambulatory referral to Cardiology       Comments: If you have not heard from the Cardiology office within the next 72 hours please call [phone].   08/30/23 2140              Benjiman Core, MD 08/30/23 2207

## 2023-08-30 NOTE — Discharge Instructions (Addendum)
 Follow-up with cardio.  Your gastroenterologist is already started you on the medicine that I was going to star for the reflux.

## 2023-08-30 NOTE — ED Triage Notes (Signed)
 Pt states that she has been having dizziness for the past few days with some SOB, reports that she recently had her port removed, having CP.

## 2023-08-31 MED ORDER — FAMOTIDINE 40 MG PO TABS: 40.0000 mg | ORAL_TABLET | Freq: Every day | ORAL | 2 refills | Status: DC

## 2023-09-03 ENCOUNTER — Other Ambulatory Visit: Payer: Self-pay | Admitting: Hematology and Oncology

## 2023-09-05 ENCOUNTER — Other Ambulatory Visit: Payer: Self-pay | Admitting: Internal Medicine

## 2023-09-06 ENCOUNTER — Encounter: Payer: Self-pay | Admitting: Hematology and Oncology

## 2023-09-13 ENCOUNTER — Encounter: Payer: Self-pay | Admitting: Certified Registered Nurse Anesthetist

## 2023-09-13 ENCOUNTER — Ambulatory Visit: Admitting: Physician Assistant

## 2023-09-13 ENCOUNTER — Ambulatory Visit: Payer: Self-pay | Admitting: Licensed Clinical Social Worker

## 2023-09-13 NOTE — Progress Notes (Deleted)
 Patient ID: [name], female   DOB: 1980/09/13, 43 y.o.   MRN: [id number]  ED visit 08/30/2023  Patient presents with dizziness and chest pain. Has been dealing with for a while. Some shortness of breath. Previous cancer history. Previous workup for similar symptoms. History of palpitations. History of GERD. Sees GI and is seeing cardiology. Also worried because there is some pain at the site of her port had previously been. States she felt dizzy with the palpitations today.    Patient with multiple complaints.  Shortness of breath palpitations dizziness chest pain.  Workup reassuring.  Does have a history of GERD.  However on relatively maximized treatment already from GI.  Do not think there is any medicines at this time but will need follow-up.  Will give GI cocktail here.   EKG did show some premature atrial contractions also had some PVCs.  On metoprolol.  Can follow-up with cardiology.  I would likely has a component of anxiety.  Workup reassuring.  Do not think we need further ER workup however.  Appears stable for discharge home.   If you have not heard from the Cardiology office within the next 72 hours please call [phone].

## 2023-09-13 NOTE — Patient Instructions (Signed)
 Visit Information  Thank you for taking time to visit with me today. Please don't hesitate to contact me if I can be of assistance to you.   Following are the goals we discussed today:   Goals Addressed             This Visit's Progress    Obtain Supportive Resources-Finances   On track    Activities and task to complete in order to accomplish goals.   Keep all upcoming appointments discussed today Continue with compliance of taking medication prescribed by Doctor Implement healthy coping skills discussed to assist with management of symptoms Review counseling resources provided and/or discussed Contact [employer], 24 hours a day, seven days a week, at [phone]) with any questions or concerns about the Provider Directory          Our next appointment is by telephone on 4/17 at 9 AM  Please call the care guide team at [phone] if you need to cancel or reschedule your appointment.   If you are experiencing a Mental Health or Behavioral Health Crisis or need someone to talk to, please call the Suicide and Crisis Lifeline: 988 call 911   Patient verbalizes understanding of instructions and care plan provided today and agrees to view in MyChart. Active MyChart status and patient understanding of how to access instructions and care plan via MyChart confirmed with patient.     Windy Fast [facility]  [facility], [facility] Clinical Social Worker Direct Dial: [phone]  Fax: [phone] Website: [email or web address] 10:54 AM

## 2023-09-13 NOTE — Patient Outreach (Signed)
 Care Coordination   Follow Up Visit Note   09/13/2023 Name: Laurie Bell MRN: [id number] DOB: 09-09-1980  Laurie Bell is a 43 y.o. year old female who sees Hoy Register, MD for primary care. I spoke with  Laurie Bell by phone today.  What matters to the patients health and wellness today?  Symptom Management    Goals Addressed             This Visit's Progress    Obtain Supportive Resources-Finances   On track    Activities and task to complete in order to accomplish goals.   Keep all upcoming appointments discussed today Continue with compliance of taking medication prescribed by Doctor Implement healthy coping skills discussed to assist with management of symptoms Review counseling resources provided and/or discussed Contact [facility] [employer], 24 hours a day, seven days a week, at [phone]) with any questions or concerns about the Provider Directory          SDOH assessments and interventions completed:  No     Care Coordination Interventions:  Yes, provided  Interventions Today    Flowsheet Row Most Recent Value  Chronic Disease   Chronic disease during today's visit Other  [MDD and Anxiety]  General Interventions   General Interventions Discussed/Reviewed General Interventions Reviewed, [facility], Doctor Visits  Doctor Visits Discussed/Reviewed Doctor Visits Reviewed  Mental Health Interventions   Mental Health Discussed/Reviewed Mental Health Reviewed, Coping Strategies, Anxiety, Depression       Follow up plan: Follow up call scheduled for 2-4 weeks    Encounter Outcome:  Patient Visit Completed   Jenel Lucks, LCSW [place]  [facility], [facility] Clinical Social Worker Direct Dial: [phone]  Fax: [phone] Website: [email or web address] 10:54 AM

## 2023-09-14 ENCOUNTER — Telehealth: Payer: Self-pay | Admitting: Adult Health

## 2023-09-14 NOTE — Telephone Encounter (Signed)
 I informed the patient that she has been rescheduled and Laurie Bell is acceptable to appointments that are scheduled.

## 2023-09-17 ENCOUNTER — Encounter: Payer: Self-pay | Admitting: Internal Medicine

## 2023-09-17 ENCOUNTER — Ambulatory Visit (AMBULATORY_SURGERY_CENTER): Payer: Medicaid Other | Admitting: Internal Medicine

## 2023-09-17 VITALS — BP 139/90 | HR 84 | Temp 97.5°F | Resp 16 | Ht 66.0 in | Wt 209.0 lb

## 2023-09-17 DIAGNOSIS — K5710 Diverticulosis of small intestine without perforation or abscess without bleeding: Secondary | ICD-10-CM

## 2023-09-17 DIAGNOSIS — K2950 Unspecified chronic gastritis without bleeding: Secondary | ICD-10-CM

## 2023-09-17 DIAGNOSIS — K3189 Other diseases of stomach and duodenum: Secondary | ICD-10-CM | POA: Diagnosis not present

## 2023-09-17 DIAGNOSIS — K219 Gastro-esophageal reflux disease without esophagitis: Secondary | ICD-10-CM

## 2023-09-17 DIAGNOSIS — K259 Gastric ulcer, unspecified as acute or chronic, without hemorrhage or perforation: Secondary | ICD-10-CM

## 2023-09-17 DIAGNOSIS — K2289 Other specified disease of esophagus: Secondary | ICD-10-CM

## 2023-09-17 MED ORDER — SODIUM CHLORIDE 0.9 % IV SOLN: 500.0000 mL | Freq: Once | INTRAVENOUS | Status: DC

## 2023-09-17 MED ORDER — SUCRALFATE 1 GM/10ML PO SUSP: 1.0000 g | Freq: Four times a day (QID) | ORAL | 0 refills | Status: DC

## 2023-09-17 NOTE — Progress Notes (Signed)
 Called to room to assist during endoscopic procedure.  Patient ID and intended procedure confirmed with present staff. Received instructions for my participation in the procedure from the performing physician.

## 2023-09-17 NOTE — Progress Notes (Signed)
1535 Robinul 0.1 mg IV given due large amount of secretions upon assessment.  MD made aware, vss  

## 2023-09-17 NOTE — Progress Notes (Signed)
 Report given to PACU, vss

## 2023-09-17 NOTE — Op Note (Signed)
 [facility] Patient Name: Laurie Bell Procedure Date: 09/17/2023 3:33 PM MRN: [id number] Endoscopist: Particia Lather , , [id number] Age: 43 Referring MD:  Date of Birth: 1980/10/09 Gender: Female Account #: [id number] Procedure:                Upper GI endoscopy Indications:              Heartburn Medicines:                Monitored Anesthesia Care Procedure:                Pre-Anesthesia Assessment:                           - Prior to the procedure, a History and Physical                            was performed, and patient medications and                            allergies were reviewed. The patient's tolerance of                            previous anesthesia was also reviewed. The risks                            and benefits of the procedure and the sedation                            options and risks were discussed with the patient.                            All questions were answered, and informed consent                            was obtained. Prior Anticoagulants: The patient has                            taken no anticoagulant or antiplatelet agents. ASA                            Grade Assessment: II - A patient with mild systemic                            disease. After reviewing the risks and benefits,                            the patient was deemed in satisfactory condition to                            undergo the procedure.                           After obtaining informed consent, the endoscope was  passed under direct vision. Throughout the                            procedure, the patient's blood pressure, pulse, and                            oxygen saturations were monitored continuously. The                            GIF [id number] #[id number] was introduced through the                            mouth, and advanced to the second part of duodenum.                            The upper GI endoscopy was  accomplished without                            difficulty. The patient tolerated the procedure                            well. Scope In: Scope Out: Findings:                 A non-bleeding diverticulum with a small opening                            and no stigmata of recent bleeding was found at the                            cricopharyngeus.                           White nummular lesions were noted in the proximal                            esophagus. Biopsies were taken with a cold forceps                            for histology.                           Three non-bleeding cratered gastric ulcers with a                            clean ulcer base (Forrest Class III) were found in                            the gastric body. The largest lesion was 10 mm in                            largest dimension. Biopsies were taken with a cold                            forceps for  histology.                           Localized inflammation characterized by congestion                            (edema), erythema and granularity was found in the                            gastric body and in the gastric antrum. Biopsies                            were taken with a cold forceps for histology.                           The examined duodenum was normal. Complications:            No immediate complications. Estimated Blood Loss:     Estimated blood loss was minimal. 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                           - White nummular lesions in esophageal mucosa.                            Biopsied.                           - Non-bleeding gastric ulcers with a clean ulcer                            base (Forrest Class III). Biopsied.                           - Gastritis. Biopsied.                           - Normal examined duodenum. Recommendation:           - Discharge patient to home (with escort).                           - Await pathology  results.                           - Continue Nexium 40 mg BID.                           - Start sucralfate suspension 1 g QID for 4 weeks.                           - Avoid NSAIDs.                           - Repeat EGD in 2 months to assess for healing of  gastric ulcers.                           - The findings and recommendations were discussed                            with the patient. Dr Particia Lather "Alan Ripper" Leonides Schanz,  09/17/2023 3:53:57 PM

## 2023-09-17 NOTE — Progress Notes (Signed)
 GASTROENTEROLOGY PROCEDURE H&P NOTE   Primary Care Physician: Hoy Register, MD    Reason for Procedure:   GERD  Plan:    EGD  Patient is appropriate for endoscopic procedure(s) in the ambulatory ([facility]) setting.  The nature of the procedure, as well as the risks, benefits, and alternatives were carefully and thoroughly reviewed with the patient. Ample time for discussion and questions allowed. The patient understood, was satisfied, and agreed to proceed.     HPI: Laurie Bell is a 43 y.o. female who presents for EGD for evaluation of GERD .  Patient was most recently seen in the Gastroenterology Clinic on 08/09/23.  No interval change in medical history since that appointment. Please refer to that note for full details regarding GI history and clinical presentation.   Past Medical History:  Diagnosis Date   Anxiety    Arthritis    Breast cancer (HCC)    Chlamydia contact, treated    GERD (gastroesophageal reflux disease)    Hyperthyroidism     Past Surgical History:  Procedure Laterality Date   AXILLARY SENTINEL NODE BIOPSY Right 12/01/2022   Procedure: RIGHT AXILLARY SENTINEL NODE BIOPSY;  Surgeon: Abigail Miyamoto, MD;  Location: [facility];  Service: General;  Laterality: Right;   BREAST BIOPSY Right 10/02/2022   MM RT BREAST BX W LOC DEV 1ST LESION IMAGE BX SPEC STEREO GUIDE 10/02/2022 [facility]   BREAST BIOPSY  11/10/2022   MM RT RADIOACTIVE SEED LOC MAMMO GUIDE 11/10/2022 [facility]   BREAST LUMPECTOMY WITH RADIOACTIVE SEED LOCALIZATION Right 11/13/2022   Procedure: RIGHT BREAST LUMPECTOMY WITH RADIOACTIVE SEED LOCALIZATION;  Surgeon: Abigail Miyamoto, MD;  Location: [facility];  Service: General;  Laterality: Right;   ENDOMETRIAL ABLATION N/A 04/25/2017   Procedure: ENDOMETRIAL ABLATION With NOVASURE;  Surgeon: Adam Phenix, MD;  Location: [facility];  Service: Gynecology;  Laterality: N/A;   ESOPHAGEAL MANOMETRY N/A  07/27/2021   Procedure: ESOPHAGEAL MANOMETRY (EM);  Surgeon: Imogene Burn, MD;  Location: [facility];  Service: Gastroenterology;  Laterality: N/A;   PORTACATH PLACEMENT N/A 12/01/2022   Procedure: INSERTION PORT-A-CATH WITH ULTRASOUND GUIDANCE;  Surgeon: Abigail Miyamoto, MD;  Location: [facility];  Service: General;  Laterality: N/A;   RE-EXCISION OF BREAST CANCER,SUPERIOR MARGINS Right 12/01/2022   Procedure: RE-EXCISION OF RIGHT BREAST CANCER;  Surgeon: Abigail Miyamoto, MD;  Location: [facility];  Service: General;  Laterality: Right;   TUBAL LIGATION  2003    Prior to Admission medications   Medication Sig Start Date End Date Taking? Authorizing Provider  acetaminophen (TYLENOL) 500 MG tablet Take 1,000 mg by mouth every 6 (six) hours as needed for moderate pain.    [provider]  albuterol (VENTOLIN HFA) 108 (90 Base) MCG/ACT inhaler TAKE 2 PUFFS BY MOUTH EVERY 6 HOURS AS NEEDED FOR WHEEZE OR SHORTNESS OF BREATH Patient taking differently: Inhale 2 puffs into the lungs every 6 (six) hours as needed for wheezing or shortness of breath. 12/22/22   Hoy Register, MD  cyclobenzaprine (FLEXERIL) 10 MG tablet Take 1 tablet (10 mg total) by mouth 2 (two) times daily as needed for muscle spasms. TAKE 1 TABLET BY MOUTH AT BEDTIME AS NEEDED FOR MUSCLE SPASMS 08/20/23   Hoy Register, MD  esomeprazole (NEXIUM) 40 MG capsule Take 1 capsule (40 mg total) by mouth 2 (two) times daily before a meal. 07/04/23   Imogene Burn, MD  famotidine (PEPCID) 40 MG tablet Take 1 tablet (40 mg total)  by mouth at bedtime. 08/31/23   McMichael, Bayley M, PA-C  gabapentin (NEURONTIN) 400 MG capsule TAKE 1 CAPSULE BY MOUTH IN AM AND NOON, AND 2 CAPSULE BY MOUTH AT BEDTIME 09/04/23   Serena Croissant, MD  hydrOXYzine (ATARAX) 25 MG tablet TAKE 1 TABLET(25 MG) BY MOUTH THREE TIMES DAILY 08/20/23   Hoy Register, MD  linaclotide (LINZESS) 145 MCG CAPS capsule Take 1 capsule (145 mcg total) by mouth daily before  breakfast. Patient taking differently: Take 145 mcg by mouth as needed. 05/19/22   Imogene Burn, MD  methimazole (TAPAZOLE) 5 MG tablet Take 1 tablet (5 mg total) by mouth as directed. 1 tablet Monday through Thursday,, skip Friday, Saturdays and  Sundays 04/03/23   Shamleffer, Konrad Dolores, MD  metoprolol succinate (TOPROL-XL) 25 MG 24 hr tablet Take 0.5 tablets (12.5 mg total) by mouth daily. For increased heart rate 08/20/23   Newlin, Enobong, MD  naproxen (NAPROSYN) 500 MG tablet TAKE 1 TABLET BY MOUTH TWICE A DAY AS NEEDED FOR PAIN 03/19/23   Hoy Register, MD  oxyCODONE-acetaminophen (PERCOCET/ROXICET) 5-325 MG tablet Take 1 tablet by mouth 4 (four) times daily. 07/13/23   [provider]  rizatriptan (MAXALT) 10 MG tablet Take 1 tablet (10 mg total) by mouth as needed for migraine. May repeat in 2 hours if needed 08/20/23   Hoy Register, MD  sucralfate (CARAFATE) 1 GM/10ML suspension Take 10 mLs (1 g total) by mouth 4 (four) times daily. Take 2 teaspoons with each meal and 2 teaspoons at bedtime. Do not take within 2 hours of any other medications 08/09/23   Arnaldo Natal, NP  topiramate (TOPAMAX) 50 MG tablet Take 1 tablet (50 mg total) by mouth 2 (two) times daily. 08/20/23   Hoy Register, MD  venlafaxine XR (EFFEXOR XR) 75 MG 24 hr capsule Take 1 capsule (75 mg total) by mouth daily with breakfast. 08/20/23   Hoy Register, MD  Vitamin D, Ergocalciferol, (DRISDOL) 1.25 MG (50000 UNIT) CAPS capsule Take 50,000 Units by mouth once a week. 07/19/23   [provider]  zolpidem (AMBIEN) 5 MG tablet Take 1 tablet (5 mg total) by mouth at bedtime as needed for sleep. 06/08/23   Serena Croissant, MD  medroxyPROGESTERone (PROVERA) 10 MG tablet Take 2 tablets by mouth daily Patient not taking: Reported on 06/05/2019 02/06/18 02/02/20  Conan Bowens, MD    Current Outpatient Medications  Medication Sig Dispense Refill   acetaminophen (TYLENOL) 500 MG tablet Take 1,000  mg by mouth every 6 (six) hours as needed for moderate pain.     albuterol (VENTOLIN HFA) 108 (90 Base) MCG/ACT inhaler TAKE 2 PUFFS BY MOUTH EVERY 6 HOURS AS NEEDED FOR WHEEZE OR SHORTNESS OF BREATH (Patient taking differently: Inhale 2 puffs into the lungs every 6 (six) hours as needed for wheezing or shortness of breath.) 54 each 0   cyclobenzaprine (FLEXERIL) 10 MG tablet Take 1 tablet (10 mg total) by mouth 2 (two) times daily as needed for muscle spasms. TAKE 1 TABLET BY MOUTH AT BEDTIME AS NEEDED FOR MUSCLE SPASMS 180 tablet 1   esomeprazole (NEXIUM) 40 MG capsule Take 1 capsule (40 mg total) by mouth 2 (two) times daily before a meal. 60 capsule 4   famotidine (PEPCID) 40 MG tablet Take 1 tablet (40 mg total) by mouth at bedtime. 30 tablet 2   gabapentin (NEURONTIN) 400 MG capsule TAKE 1 CAPSULE BY MOUTH IN AM AND NOON, AND 2 CAPSULE BY MOUTH AT BEDTIME 56  capsule 0   hydrOXYzine (ATARAX) 25 MG tablet TAKE 1 TABLET(25 MG) BY MOUTH THREE TIMES DAILY 270 tablet 1   linaclotide (LINZESS) 145 MCG CAPS capsule Take 1 capsule (145 mcg total) by mouth daily before breakfast. (Patient taking differently: Take 145 mcg by mouth as needed.) 30 capsule 5   methimazole (TAPAZOLE) 5 MG tablet Take 1 tablet (5 mg total) by mouth as directed. 1 tablet Monday through Thursday,, skip Friday, Saturdays and  Sundays 52 tablet 3   metoprolol succinate (TOPROL-XL) 25 MG 24 hr tablet Take 0.5 tablets (12.5 mg total) by mouth daily. For increased heart rate 45 tablet 1   naproxen (NAPROSYN) 500 MG tablet TAKE 1 TABLET BY MOUTH TWICE A DAY AS NEEDED FOR PAIN 60 tablet 1   oxyCODONE-acetaminophen (PERCOCET/ROXICET) 5-325 MG tablet Take 1 tablet by mouth 4 (four) times daily.     rizatriptan (MAXALT) 10 MG tablet Take 1 tablet (10 mg total) by mouth as needed for migraine. May repeat in 2 hours if needed 10 tablet 2   sucralfate (CARAFATE) 1 GM/10ML suspension Take 10 mLs (1 g total) by mouth 4 (four) times daily. Take 2  teaspoons with each meal and 2 teaspoons at bedtime. Do not take within 2 hours of any other medications 400 mL 0   topiramate (TOPAMAX) 50 MG tablet Take 1 tablet (50 mg total) by mouth 2 (two) times daily. 180 tablet 1   venlafaxine XR (EFFEXOR XR) 75 MG 24 hr capsule Take 1 capsule (75 mg total) by mouth daily with breakfast. 90 capsule 1   Vitamin D, Ergocalciferol, (DRISDOL) 1.25 MG (50000 UNIT) CAPS capsule Take 50,000 Units by mouth once a week.     zolpidem (AMBIEN) 5 MG tablet Take 1 tablet (5 mg total) by mouth at bedtime as needed for sleep. 30 tablet 3   No current facility-administered medications for this visit.    Allergies as of 09/17/2023 - Review Complete 09/13/2023  Allergen Reaction Noted   Latex Other (See Comments) 04/18/2017   Tramadol Other (See Comments) 01/22/2023    Family History  Problem Relation Age of Onset   Hypertension Mother    Hypertension Maternal Aunt    Breast cancer Maternal Grandmother        dx > 50   Stomach cancer Maternal Grandfather        dx > 50   Breast cancer Paternal Grandmother        dx > 50   Esophageal cancer Neg Hx    Rectal cancer Neg Hx    Colon cancer Neg Hx     Social History   Socioeconomic History   Marital status: Single    Spouse name: Not on file   Number of children: 2   Years of education: Not on file   Highest education level: Not on file  Occupational History   Occupation: Event organiser: [employer]  Tobacco Use   Smoking status: Never   Smokeless tobacco: Never  Vaping Use   Vaping status: Never Used  Substance and Sexual Activity   Alcohol use: No   Drug use: Not Currently    Types: Marijuana    Comment: quit 2018   Sexual activity: Not Currently    Birth control/protection: Surgical  Other Topics Concern   Not on file  Social History Narrative   ** Merged History Encounter **       Right Handed  Lives in a one story home  Social Drivers of Health   Financial Resource Strain:  Medium Risk (11/03/2021)   Overall Financial Resource Strain (CARDIA)    Difficulty of Paying Living Expenses: Somewhat hard  Food Insecurity: No Food Insecurity (05/22/2023)   Hunger Vital Sign    Worried About Running Out of Food in the Last Year: Never true    Ran Out of Food in the Last Year: Never true  Transportation Needs: Unmet Transportation Needs (05/22/2023)   PRAPARE - Administrator, Civil Service (Medical): Yes    Lack of Transportation (Non-Medical): Yes  Physical Activity: Sufficiently Active (11/03/2021)   Exercise Vital Sign    Days of Exercise per Week: 3 days    Minutes of Exercise per Session: 50 min  Stress: Stress Concern Present (11/03/2021)   [employer] of Occupational Health - Occupational Stress Questionnaire    Feeling of Stress : Very much  Social Connections: Moderately Isolated (11/03/2021)   Social Connection and Isolation Panel [NHANES]    Frequency of Communication with Friends and Family: Once a week    Frequency of Social Gatherings with Friends and Family: Once a week    Attends Religious Services: More than 4 times per year    Active Member of [employer] or Organizations: No    Attends Banker Meetings: Never    Marital Status: Living with partner  Intimate Partner Violence: Not At Risk (02/17/2023)   Humiliation, Afraid, Rape, and Kick questionnaire    Fear of Current or Ex-Partner: No    Emotionally Abused: No    Physically Abused: No    Sexually Abused: No    Physical Exam: Vital signs in last 24 hours: There were no vitals taken for this visit. GEN: NAD EYE: Sclerae anicteric ENT: MMM CV: Non-tachycardic Pulm: No increased WOB GI: Soft NEURO:  Alert & Oriented   Eulah Pont, MD [facility]   09/17/2023 2:55 PM

## 2023-09-17 NOTE — Patient Instructions (Addendum)
 YOU HAD AN ENDOSCOPIC PROCEDURE TODAY AT THE [facility]:   Refer to the procedure report that was given to you for any specific questions about what was found during the examination.  If the procedure report does not answer your questions, please call your gastroenterologist to clarify.  If you requested that your care partner not be given the details of your procedure findings, then the procedure report has been included in a sealed envelope for you to review at your convenience later.  YOU SHOULD EXPECT: Some feelings of bloating in the abdomen. Passage of more gas than usual.  Walking can help get rid of the air that was put into your GI tract during the procedure and reduce the bloating. If you had a lower endoscopy (such as a colonoscopy or flexible sigmoidoscopy) you may notice spotting of blood in your stool or on the toilet paper. If you underwent a bowel prep for your procedure, you may not have a normal bowel movement for a few days.  Please Note:  You might notice some irritation and congestion in your nose or some drainage.  This is from the oxygen used during your procedure.  There is no need for concern and it should clear up in a day or so.  SYMPTOMS TO REPORT IMMEDIATELY:   Following upper endoscopy (EGD)  Vomiting of blood or coffee ground material  New chest pain or pain under the shoulder blades  Painful or persistently difficult swallowing  New shortness of breath  Fever of 100F or higher  Black, tarry-looking stools  For urgent or emergent issues, a gastroenterologist can be reached at any hour by calling [phone]. Do not use MyChart messaging for urgent concerns.    DIET:  We do recommend a small meal at first, but then you may proceed to your regular diet.  Drink plenty of fluids but you should avoid alcoholic beverages for 24 hours.  MEDICATIONS: Continue present medications. Continue Nexium 40 mg twice daily. Start Sucralfate suspension 1 gram 4  times daily for 4 weeks. Avoid Non-steroidal Anti-inflammatory Drugs (NSAIDs).  FOLLOW UP: Await pathology results. Repeat EGD in 2 months to assess for healing of gastric ulcers. This appointment has been scheduled in recovery.  Please see handouts given to you by your recovery nurse: Gastritis.  Thank you for allowing [place] to provide for your healthcare needs today.  ACTIVITY:  You should plan to take it easy for the rest of today and you should NOT DRIVE or use heavy machinery until tomorrow (because of the sedation medicines used during the test).    FOLLOW UP: Our staff will call the number listed on your records the next business day following your procedure.  We will call around 7:15- 8:00 am to check on you and address any questions or concerns that you may have regarding the information given to you following your procedure. If we do not reach you, we will leave a message.     If any biopsies were taken you will be contacted by phone or by letter within the next 1-3 weeks.  Please call us at [phone] if you have not heard about the biopsies in 3 weeks.    SIGNATURES/CONFIDENTIALITY: You and/or your care partner have signed paperwork which will be entered into your electronic medical record.  These signatures attest to the fact that that the information above on your After Visit Summary has been reviewed and is understood.  Full responsibility of the confidentiality of this  discharge information lies with you and/or your care-partner.

## 2023-09-18 ENCOUNTER — Inpatient Hospital Stay: Admitting: Nurse Practitioner

## 2023-09-18 ENCOUNTER — Telehealth: Payer: Self-pay | Admitting: *Deleted

## 2023-09-18 NOTE — Telephone Encounter (Signed)
  Follow up Call-     09/17/2023    2:53 PM 06/23/2021    9:46 AM 04/28/2021   10:16 AM  Call back number  Post procedure Call Back phone  # [phone]  Permission to leave phone message Yes Yes Yes     Patient questions:  Do you have a fever, pain , or abdominal swelling? No. Pain Score  0 *  Have you tolerated food without any problems? Yes.    Have you been able to return to your normal activities? Yes.    Do you have any questions about your discharge instructions: Diet   No. Medications  No. Follow up visit  No.  Do you have questions or concerns about your Care? No.  Actions: * If pain score is 4 or above: No action needed, pain <4.

## 2023-09-20 ENCOUNTER — Encounter: Payer: Self-pay | Admitting: Internal Medicine

## 2023-09-20 LAB — SURGICAL PATHOLOGY

## 2023-09-26 ENCOUNTER — Other Ambulatory Visit (HOSPITAL_COMMUNITY): Payer: Self-pay

## 2023-09-27 ENCOUNTER — Ambulatory Visit: Payer: Self-pay | Admitting: Licensed Clinical Social Worker

## 2023-09-28 ENCOUNTER — Other Ambulatory Visit: Payer: Self-pay

## 2023-09-28 ENCOUNTER — Encounter: Payer: Medicaid Other | Admitting: Adult Health

## 2023-09-28 ENCOUNTER — Encounter: Payer: Self-pay | Admitting: *Deleted

## 2023-09-28 DIAGNOSIS — R519 Headache, unspecified: Secondary | ICD-10-CM

## 2023-09-28 DIAGNOSIS — Z171 Estrogen receptor negative status [ER-]: Secondary | ICD-10-CM

## 2023-10-01 ENCOUNTER — Inpatient Hospital Stay: Attending: Hematology and Oncology | Admitting: Adult Health

## 2023-10-01 ENCOUNTER — Ambulatory Visit (INDEPENDENT_AMBULATORY_CARE_PROVIDER_SITE_OTHER): Payer: 59 | Admitting: Internal Medicine

## 2023-10-01 ENCOUNTER — Encounter: Payer: Self-pay | Admitting: Internal Medicine

## 2023-10-01 ENCOUNTER — Encounter: Payer: Self-pay | Admitting: Adult Health

## 2023-10-01 VITALS — BP 110/78 | HR 100 | Resp 16 | Ht 66.0 in | Wt 206.4 lb

## 2023-10-01 VITALS — BP 111/59 | HR 91 | Temp 97.3°F | Resp 17 | Wt 206.0 lb

## 2023-10-01 DIAGNOSIS — Z9221 Personal history of antineoplastic chemotherapy: Secondary | ICD-10-CM | POA: Insufficient documentation

## 2023-10-01 DIAGNOSIS — M542 Cervicalgia: Secondary | ICD-10-CM | POA: Diagnosis not present

## 2023-10-01 DIAGNOSIS — Z923 Personal history of irradiation: Secondary | ICD-10-CM | POA: Insufficient documentation

## 2023-10-01 DIAGNOSIS — Z9189 Other specified personal risk factors, not elsewhere classified: Secondary | ICD-10-CM | POA: Diagnosis not present

## 2023-10-01 DIAGNOSIS — Z171 Estrogen receptor negative status [ER-]: Secondary | ICD-10-CM | POA: Diagnosis not present

## 2023-10-01 DIAGNOSIS — Z853 Personal history of malignant neoplasm of breast: Secondary | ICD-10-CM | POA: Insufficient documentation

## 2023-10-01 DIAGNOSIS — R918 Other nonspecific abnormal finding of lung field: Secondary | ICD-10-CM | POA: Diagnosis not present

## 2023-10-01 DIAGNOSIS — C50411 Malignant neoplasm of upper-outer quadrant of right female breast: Secondary | ICD-10-CM

## 2023-10-01 DIAGNOSIS — E042 Nontoxic multinodular goiter: Secondary | ICD-10-CM

## 2023-10-01 DIAGNOSIS — E0590 Thyrotoxicosis, unspecified without thyrotoxic crisis or storm: Secondary | ICD-10-CM | POA: Insufficient documentation

## 2023-10-01 LAB — T4, FREE: Free T4: 1 ng/dL (ref 0.8–1.8)

## 2023-10-01 LAB — TSH: TSH: 0.69 m[IU]/L

## 2023-10-01 MED ORDER — METHIMAZOLE 5 MG PO TABS: 5.0000 mg | ORAL_TABLET | ORAL | 3 refills | Status: DC

## 2023-10-01 NOTE — Progress Notes (Unsigned)
 Name: Laurie Bell  MRN/ DOB: [id number], March 10, 1981    Age/ Sex: 43 y.o., female    PCP: Joaquin Mulberry, MD   Reason for Endocrinology Evaluation: Low TSH     Date of Initial Endocrinology Evaluation: 12/09/2021    HPI: Laurie Bell is a 43 y.o. female with a past medical history of. Breast Ca (Dx4/2024) The patient presented for initial endocrinology clinic visit on 12/09/2021 for consultative assistance with her low TSH.   Patient has been noted with low TSH since September 2012 with a TSH at 0.129u IU/mL, with normal free T4.  TRAb was undetectable Thyroid  ultrasound on 12/23/2021 revealed multinodular goiter with a right superior 1.5 cm nodule meeting FNA criteria  We opted to start methimazole  due to multiple symptoms 11/2021   Mother and maternal aunt with thyroid  disease   SUBJECTIVE:    Today (10/01/23):  [name] is here for a follow up on MNG and subclinical hyperthyroidism  She continues to follow-up with oncology for right breast cancer, s/p right lumpectomy, received adjuvant chemotherapy  Presented to the ED 08/2023 with dizziness, EKG showed some premature atrial contractions and PVCs, on metoprolol .  Was attributed to anxiety, workup reassuring  She continues with anxiety She denies local neck swelling  No constipation or diarrhea  Continues with  occasional palpitations  Has chronic stable tremors  No Biotin intake    Methimazole  5 mg , 1 tab 4 days a week   HISTORY:  Past Medical History:  Past Medical History:  Diagnosis Date   Anxiety    Arthritis    Breast cancer (HCC)    Chlamydia contact, treated    GERD (gastroesophageal reflux disease)    Hyperthyroidism    Past Surgical History:  Past Surgical History:  Procedure Laterality Date   AXILLARY SENTINEL NODE BIOPSY Right 12/01/2022   Procedure: RIGHT AXILLARY SENTINEL NODE BIOPSY;  Surgeon: Oza Blumenthal, MD;  Location: [facility];  Service: General;  Laterality: Right;    BREAST BIOPSY Right 10/02/2022   MM RT BREAST BX W LOC DEV 1ST LESION IMAGE BX SPEC STEREO GUIDE 10/02/2022 [facility]   BREAST BIOPSY  11/10/2022   MM RT RADIOACTIVE SEED LOC MAMMO GUIDE 11/10/2022 [facility]   BREAST LUMPECTOMY WITH RADIOACTIVE SEED LOCALIZATION Right 11/13/2022   Procedure: RIGHT BREAST LUMPECTOMY WITH RADIOACTIVE SEED LOCALIZATION;  Surgeon: Oza Blumenthal, MD;  Location: [facility];  Service: General;  Laterality: Right;   ENDOMETRIAL ABLATION N/A 04/25/2017   Procedure: ENDOMETRIAL ABLATION With NOVASURE;  Surgeon: Tresia Fruit, MD;  Location: [facility];  Service: Gynecology;  Laterality: N/A;   ESOPHAGEAL MANOMETRY N/A 07/27/2021   Procedure: ESOPHAGEAL MANOMETRY (EM);  Surgeon: Daina Drum, MD;  Location: [facility];  Service: Gastroenterology;  Laterality: N/A;   PORTACATH PLACEMENT N/A 12/01/2022   Procedure: INSERTION PORT-A-CATH WITH ULTRASOUND GUIDANCE;  Surgeon: Oza Blumenthal, MD;  Location: [facility];  Service: General;  Laterality: N/A;   RE-EXCISION OF BREAST CANCER,SUPERIOR MARGINS Right 12/01/2022   Procedure: RE-EXCISION OF RIGHT BREAST CANCER;  Surgeon: Oza Blumenthal, MD;  Location: [facility] OR;  Service: General;  Laterality: Right;   TUBAL LIGATION  2003    Social History:  reports that she has never smoked. She has never used smokeless tobacco. She reports that she does not currently use drugs after having used the following drugs: Marijuana. She reports that she does not drink alcohol. Family History: family history includes Breast cancer in her  maternal grandmother and paternal grandmother; Hypertension in her maternal aunt and mother; Stomach cancer in her maternal grandfather.   HOME MEDICATIONS: Allergies as of 10/01/2023       Reactions   Latex Other (See Comments)   Burn in vaginal area with latex condoms   Tramadol  Other (See Comments)   States "it messes me up" no further info         Medication List        Accurate as of October 01, 2023 11:09 AM. If you have any questions, ask your nurse or doctor.          acetaminophen  500 MG tablet Commonly known as: TYLENOL  Take 1,000 mg by mouth every 6 (six) hours as needed for moderate pain.   albuterol  108 (90 Base) MCG/ACT inhaler Commonly known as: VENTOLIN  HFA TAKE 2 PUFFS BY MOUTH EVERY 6 HOURS AS NEEDED FOR WHEEZE OR SHORTNESS OF BREATH What changed: See the new instructions.   cefdinir 300 MG capsule Commonly known as: OMNICEF Take 300 mg by mouth 2 (two) times daily.   cyclobenzaprine  10 MG tablet Commonly known as: FLEXERIL  Take 1 tablet (10 mg total) by mouth 2 (two) times daily as needed for muscle spasms. TAKE 1 TABLET BY MOUTH AT BEDTIME AS NEEDED FOR MUSCLE SPASMS   esomeprazole  40 MG capsule Commonly known as: NexIUM  Take 1 capsule (40 mg total) by mouth 2 (two) times daily before a meal.   famotidine  40 MG tablet Commonly known as: PEPCID  Take 1 tablet (40 mg total) by mouth at bedtime.   gabapentin  400 MG capsule Commonly known as: NEURONTIN  TAKE 1 CAPSULE BY MOUTH IN AM AND NOON, AND 2 CAPSULE BY MOUTH AT BEDTIME   hydrOXYzine  25 MG tablet Commonly known as: ATARAX  TAKE 1 TABLET(25 MG) BY MOUTH THREE TIMES DAILY   ipratropium 0.03 % nasal spray Commonly known as: ATROVENT  Place into the nose.   linaclotide  145 MCG Caps capsule Commonly known as: Linzess  Take 1 capsule (145 mcg total) by mouth daily before breakfast. What changed:  when to take this reasons to take this   methimazole  5 MG tablet Commonly known as: TAPAZOLE  Take 1 tablet (5 mg total) by mouth as directed. 1 tablet Monday through Thursday,, skip Friday, Saturdays and  Sundays   metoprolol  succinate 25 MG 24 hr tablet Commonly known as: TOPROL -XL Take 0.5 tablets (12.5 mg total) by mouth daily. For increased heart rate   naloxone 4 MG/0.1ML Liqd nasal spray kit Commonly known as: NARCAN 1 (ONE) SPRAY AS  NEEDED   naproxen  500 MG tablet Commonly known as: NAPROSYN  TAKE 1 TABLET BY MOUTH TWICE A DAY AS NEEDED FOR PAIN   oxyCODONE -acetaminophen  5-325 MG tablet Commonly known as: PERCOCET/ROXICET Take 1 tablet by mouth 4 (four) times daily.   potassium chloride  10 MEQ tablet Commonly known as: KLOR-CON  Take by mouth.   rizatriptan  10 MG tablet Commonly known as: Maxalt  Take 1 tablet (10 mg total) by mouth as needed for migraine. May repeat in 2 hours if needed   sucralfate  1 GM/10ML suspension Commonly known as: CARAFATE  Take 10 mLs (1 g total) by mouth 4 (four) times daily. Take 2 teaspoons with each meal and 2 teaspoons at bedtime. Do not take within 2 hours of any other medications   sucralfate  1 GM/10ML suspension Commonly known as: CARAFATE  Take 10 mLs (1 g total) by mouth 4 (four) times daily.   topiramate  50 MG tablet Commonly known as: Topamax  Take 1 tablet (50  mg total) by mouth 2 (two) times daily.   venlafaxine  XR 75 MG 24 hr capsule Commonly known as: Effexor  XR Take 1 capsule (75 mg total) by mouth daily with breakfast.   Vitamin D  (Ergocalciferol ) 1.25 MG (50000 UNIT) Caps capsule Commonly known as: DRISDOL Take 50,000 Units by mouth once a week.   zolpidem  5 MG tablet Commonly known as: AMBIEN  Take 1 tablet (5 mg total) by mouth at bedtime as needed for sleep.          REVIEW OF SYSTEMS: A comprehensive ROS was conducted with the patient and is negative except as per HPI    OBJECTIVE:  VS: There were no vitals taken for this visit.   Wt Readings from Last 3 Encounters:  10/01/23 206 lb (93.4 kg)  09/17/23 209 lb (94.8 kg)  08/28/23 209 lb (94.8 kg)     EXAM: General: Pt appears well and is in NAD  Neck: General: Supple without adenopathy. Thyroid : Right thyroid  asymmetry noted but unable to appreciate nodules per se  Lungs: Clear with good BS bilat with no rales, rhonchi, or wheezes  Heart: Auscultation: RRR.  Extremities:  BL LE: No  pretibial edema normal ROM and strength.  Mental Status: Judgment, insight: Intact Orientation: Oriented to time, place, and person Mood and affect: No depression, anxiety, or agitation     DATA REVIEWED:   Latest Reference Range & Units 04/02/23 09:29  TSH 0.35 - 5.50 uIU/mL 0.44  T4,Free(Direct) 0.60 - 1.60 ng/dL 4.69    Latest Reference Range & Units 03/27/23 12:37  Sodium 135 - 145 mmol/L 140  Potassium 3.5 - 5.1 mmol/L 3.5  Chloride 98 - 111 mmol/L 106  CO2 22 - 32 mmol/L 26  Glucose 70 - 99 mg/dL 629 (H)  BUN 6 - 20 mg/dL 12  Creatinine 5.28 - 4.13 mg/dL 2.44  Calcium  8.9 - 10.3 mg/dL 9.6  Anion gap 5 - 15  8  Alkaline Phosphatase 38 - 126 U/L 114  Albumin 3.5 - 5.0 g/dL 4.2  AST 15 - 41 U/L 37  ALT 0 - 44 U/L 71 (H)  Total Protein 6.5 - 8.1 g/dL 7.3  Total Bilirubin 0.3 - 1.2 mg/dL 0.6  GFR, Est Non African American >60 mL/min >60     Thyroid  ultrasound 10/23/2022 Estimated total number of nodules >/= 1 cm: 2   Number of spongiform nodules >/=  2 cm not described below (TR1): 0   Number of mixed cystic and solid nodules >/= 1.5 cm not described below (TR2): 0   _________________________________________________________   Nodule labeled 1 is a solid hypoechoic nodule with lobulated/irregular margins and focus of macrocalcification (TR 5) in the mid right thyroid  lobe measuring 2.0 x 1.5 x 1.2 cm, previously measuring up to 1.5 cm. It appears slightly enlarged. **Given size (>/= 1.0 cm) and appearance, fine needle aspiration of this highly suspicious nodule should be considered based on TI-RADS criteria.   Nodule labeled 2 appears to be best described as a solid hypoechoic nodule with punctate echogenic foci (TR 5) in the inferior right thyroid  lobe that measures 0.7 x 0.6 x 0.5 cm. It demonstrates more discrete features on today's exam, and has been upgraded to TR 5. *Given size (>/= 0.5 - 0.9 cm) and appearance, a follow-up ultrasound in 1 year should be  considered based on TI-RADS criteria.   Nodule labeled 3 appears to be best described as a solid hypoechoic nodule with irregular/lobulated margins (TR 4) in the inferior right  thyroid  lobe measuring 1.1 x 0.8 x 0.6 cm. It again demonstrates more discrete features on today's exam, and has been upgraded to TR 4. *Given size (>/= 1 - 1.4 cm) and appearance, a follow-up ultrasound in 1 year should be considered based on TI-RADS criteria.   IMPRESSION: 1. Multinodular thyroid  gland. Several nodules demonstrate more discrete features on today's exam, and have been upgraded with corresponding recommendations as detailed. 2. Nodule labeled 1 in the mid right thyroid  lobe (2.0 cm TR 5) appears to demonstrate interval enlargement, and continues to meet criteria for biopsy as previously recommended. 3. Nodules labeled 2 and 3, both in the inferior right thyroid  lobe, demonstrate more conspicuous features on today's exam, now warranting follow-up ultrasound in 1 year. A total follow-up interval of 5 years is recommended.    FNA right mid nodule 05/28/2023  Clinical History: Nodule labeled 1 is a solid hypoechoic nodule with  lobulated/irregular margins and focus of macrocalcifications (TR 5) in  the mid right thyroid  measuring 2.0 x 1.5 x 1.2 cm.  Specimen Submitted:  A. THYROID , RT MID, FINE NEEDLE ASPIRATION    FINAL MICROSCOPIC DIAGNOSIS:  - Benign follicular nodule (Bethesda category II)     ASSESSMENT/PLAN/RECOMMENDATIONS:   Subclinical Hyperthyroidism:  -She was started on methimazole  in June 2023 due to multiple symptoms, TFTs normalized on repeat labs in August -Patient is clinically euthyroid  Medications : Decrease methimazole  5 mg , 4 days a week ( skips Friday, Saturday and Sundays)     2. Multinodular Goiter:  - No local neck symptoms  -Thyroid  ultrasound on 12/23/2021 revealed multinodular goiter with a right superior 1.5 cm nodule meeting FNA criteria - She is  S/P benign FNA of the right mid 2.0 cm nodule 05/2023   Follow-up in 6 months  Signed electronically by: Natale Bail, MD  [facility] End[facility]  [facility] [address], [address] [place], [place] [address] Phone: [phone] FAX: [phone]   CC: Joaquin Mulberry, MD [address] [place] [place] [address] Phone: [phone] Fax: [phone]   Return to Endocrinology clinic as below: Future Appointments  Date Time Provider Department Center  10/01/2023 11:10 AM Astella Desir, Julian Obey, MD [facility] None  10/08/2023  2:00 PM Mamie Searles, MD [facility] None  10/10/2023  3:30 PM Hassie Lint, PA-C [facility] None  10/24/2023  4:00 PM [facility] PREVISIT RM 52 [facility]  10/25/2023 11:00 AM Alease Hunter D, LCSW [facility] None  11/19/2023  3:00 PM Daina Drum, MD [facility]  11/20/2023  8:50 AM Joaquin Mulberry, MD [facility] None

## 2023-10-01 NOTE — Patient Instructions (Signed)
 Visit Information  Thank you for taking time to visit with me today. Please don't hesitate to contact me if I can be of assistance to you before our next scheduled appointment.  Our next appointment is by telephone on 5/15 at 11 AM Please call the care guide team at [phone] if you need to cancel or reschedule your appointment.   Following is a copy of your care plan:   Goals Addressed             This Visit's Progress    LCSW VBCI Social Work Care Plan   On track    Problems:   Disease Management support and education needs related to Anxiety with Panic Symptoms,  CSW Clinical Goal(s):   Over the next 90 days the Patient will attend all scheduled medical appointments as evidenced by patient report and care team review of appointment completion in electronic MEDICAL RECORD NUMBER  demonstrate a reduction in symptoms related to Anxiety with Panic Symptoms, .  Interventions:  Mental Health:  Evaluation of current treatment plan related to Anxiety with Panic Symptoms, Active listening / Reflection utilized Emotional Support Provided Reviewed mental health medications and discussed importance of compliance:   Solution-Focued Strategies employed:  Patient Goals/Self-Care Activities:  Increase coping skills and healthy habits  Plan:   Telephone follow up appointment with care management team member scheduled for:  4 weeks     COMPLETED: Obtain Supportive Resources-Finances       Activities and task to complete in order to accomplish goals.   Keep all upcoming appointments discussed today Continue with compliance of taking medication prescribed by Doctor Implement healthy coping skills discussed to assist with management of symptoms Review counseling resources provided and/or discussed Contact [employer] Services, 24 hours a day, seven days a week, at [phone]) with any questions or concerns about the Provider  Directory          Please call the Suicide and Crisis Lifeline: 988 go to [facility] [address], [place] [phone]) call 911 if you are experiencing a Mental Health or Behavioral Health Crisis or need someone to talk to.  Patient verbalizes understanding of instructions and care plan provided today and agrees to view in MyChart. Active MyChart status and patient understanding of how to access instructions and care plan via MyChart confirmed with patient.     Arlis Bent [facility]  [facility], [facility] Clinical Social Worker Direct Dial: [phone]  Fax: [phone] Website: [email or web address] 7:19 AM

## 2023-10-01 NOTE — Progress Notes (Signed)
 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, ER 30% weak, PR 2%   10/11/2022 Cancer Staging   Staging form: Breast, AJCC 8th Edition - Clinical: Stage 0 (cTis (DCIS), cN0, cM0, G3, ER+, PR+, HER2: Not Assessed) - Signed by Cameron Cea, MD on 10/11/2022 Stage prefix: Initial diagnosis Histologic grading system: 3 grade system   10/11/2022 Genetic Testing   Declined hereditary cancer genetic testing    11/13/2022 Surgery   Right lumpectomy: Grade 3 IDC, 2 cm in size inferior and posterior margins are positive, DCIS posterior margin positive, lymphovascular invasion not identified, ER 0%, PR 0%, Ki-67 85%, HER2 0 by [facility]   11/13/2022 Cancer Staging   Staging form: Breast, AJCC 8th Edition - Pathologic stage from 11/13/2022: Stage IB (pT1c, pN0, cM0, G3, ER-, PR-, HER2-) - Signed by Colie Dawes, MD on 05/21/2023 Stage prefix: Initial diagnosis Histologic grading system: 3 grade system   12/01/2022 Surgery   Margin excision: Posterior margin: Scattered foci of high-grade DCIS, 0/2 lymph nodes negative   01/01/2023 - 04/10/2023 Chemotherapy   Patient is on Treatment Plan : BREAST ADJUVANT DOSE DENSE AC q14d / PACLitaxel  q7d     05/29/2023 - 06/28/2023 Radiation Therapy   Plan Name: Breast_R Site: Breast, Right Technique: 3D Mode: Photon Dose Per Fraction: 2.67 Gy Prescribed Dose (Delivered / Prescribed): 40.05 Gy / 40.05 Gy Prescribed Fxs (Delivered / Prescribed): 15 / 15   Plan Name: Breast_R_Bst Site: Breast, Right Technique: 3D Mode: Photon Dose Per Fraction: 2 Gy Prescribed Dose (Delivered / Prescribed): 10 Gy / 10 Gy Prescribed Fxs (Delivered / Prescribed): 5 / 5     INTERVAL  HISTORY:  Laurie Bell to review her survivorship care plan detailing her treatment course for breast cancer, as well as monitoring long-term side effects of that treatment, education regarding health maintenance, screening, and overall wellness and health promotion.     Overall, Laurie Bell reports feeling moderately well.  Her biggest concern is neck pain she feels worse in her left supraclavicular area.  She notes fullness in this area on both sides of her neck, but discomfort worse in the left anterior portion.  She also endorses fatigue that has been present since chemo, and on some days worse than others.    REVIEW OF SYSTEMS:  Review of Systems  Constitutional:  Positive for fatigue. Negative for appetite change, chills, fever and unexpected weight change.  HENT:   Negative for hearing loss, lump/mass and trouble swallowing.   Eyes:  Negative for eye problems and icterus.  Respiratory:  Negative for chest tightness, cough and shortness of breath.   Cardiovascular:  Negative for chest pain, leg swelling and palpitations.  Gastrointestinal:  Negative for abdominal distention, abdominal pain, constipation, diarrhea, nausea and vomiting.  Endocrine: Negative for hot flashes.  Genitourinary:  Negative for difficulty urinating.   Musculoskeletal:  Negative for arthralgias.  Skin:  Negative for itching and rash.  Neurological:  Negative for dizziness, extremity weakness, headaches and numbness.  Hematological:  Negative for adenopathy. Does not bruise/bleed easily.  Psychiatric/Behavioral:  Negative for depression. The patient is not nervous/anxious.   Breast: Denies any new nodularity, masses, tenderness, nipple changes, or nipple discharge.       PAST MEDICAL/SURGICAL HISTORY:  Past Medical History:  Diagnosis Date   Anxiety    Arthritis    Breast cancer (HCC)    Chlamydia contact, treated    GERD (gastroesophageal reflux disease)    Hyperthyroidism    Past Surgical History:   Procedure Laterality Date   AXILLARY SENTINEL NODE BIOPSY Right 12/01/2022   Procedure: RIGHT AXILLARY SENTINEL NODE BIOPSY;  Surgeon: Oza Blumenthal, MD;  Location: [facility];  Service: General;  Laterality: Right;   BREAST BIOPSY Right 10/02/2022   MM RT BREAST BX W LOC DEV 1ST LESION IMAGE BX SPEC STEREO GUIDE 10/02/2022 [facility]   BREAST BIOPSY  11/10/2022   MM RT RADIOACTIVE SEED LOC MAMMO GUIDE 11/10/2022 [facility]   BREAST LUMPECTOMY WITH RADIOACTIVE SEED LOCALIZATION Right 11/13/2022   Procedure: RIGHT BREAST LUMPECTOMY WITH RADIOACTIVE SEED LOCALIZATION;  Surgeon: Oza Blumenthal, MD;  Location: [facility];  Service: General;  Laterality: Right;   ENDOMETRIAL ABLATION N/A 04/25/2017   Procedure: ENDOMETRIAL ABLATION With NOVASURE;  Surgeon: Tresia Fruit, MD;  Location: [facility];  Service: Gynecology;  Laterality: N/A;   ESOPHAGEAL MANOMETRY N/A 07/27/2021   Procedure: ESOPHAGEAL MANOMETRY (EM);  Surgeon: Daina Drum, MD;  Location: [facility];  Service: Gastroenterology;  Laterality: N/A;   PORTACATH PLACEMENT N/A 12/01/2022   Procedure: INSERTION PORT-A-CATH WITH ULTRASOUND GUIDANCE;  Surgeon: Oza Blumenthal, MD;  Location: [facility];  Service: General;  Laterality: N/A;   RE-EXCISION OF BREAST CANCER,SUPERIOR MARGINS Right 12/01/2022   Procedure: RE-EXCISION OF RIGHT BREAST CANCER;  Surgeon: Oza Blumenthal, MD;  Location: [facility];  Service: General;  Laterality: Right;   TUBAL LIGATION  2003     ALLERGIES:  Allergies  Allergen Reactions   Latex Other (See Comments)    Burn in vaginal area with latex condoms   Tramadol  Other (See Comments)    States "it messes me up" no further info     CURRENT MEDICATIONS:  Outpatient Encounter Medications as of 10/01/2023  Medication Sig   acetaminophen  (TYLENOL ) 500 MG tablet Take 1,000 mg by mouth every 6 (six) hours as needed for moderate pain.   albuterol  (VENTOLIN  HFA) 108 (90  Base) MCG/ACT inhaler TAKE 2 PUFFS BY MOUTH EVERY 6 HOURS AS NEEDED FOR WHEEZE OR SHORTNESS OF BREATH (Patient taking differently: Inhale 2 puffs into the lungs every 6 (six) hours as needed for wheezing or shortness of breath.)   cefdinir (OMNICEF) 300 MG capsule Take 300 mg by mouth 2 (two) times daily.   cyclobenzaprine  (FLEXERIL ) 10 MG tablet Take 1 tablet (10 mg total) by mouth 2 (two) times daily as needed for muscle spasms. TAKE 1 TABLET BY MOUTH AT BEDTIME AS NEEDED FOR MUSCLE SPASMS   esomeprazole  (NEXIUM ) 40 MG capsule Take 1 capsule (40 mg total) by mouth 2 (two) times daily before a meal.   famotidine  (PEPCID ) 40 MG tablet Take 1 tablet (40 mg total) by mouth at bedtime.   gabapentin  (NEURONTIN ) 400 MG capsule TAKE 1 CAPSULE BY MOUTH IN AM AND NOON, AND 2 CAPSULE BY MOUTH AT BEDTIME   hydrOXYzine  (ATARAX ) 25 MG tablet TAKE 1 TABLET(25 MG) BY MOUTH THREE TIMES DAILY   ipratropium (ATROVENT ) 0.03 % nasal spray Place into the nose.   linaclotide  (LINZESS ) 145 MCG CAPS capsule Take 1 capsule (145 mcg total) by mouth daily before breakfast. (Patient taking differently: Take 145 mcg by mouth as needed.)   methimazole  (TAPAZOLE ) 5 MG tablet Take 1 tablet (5 mg total) by mouth as directed. 1 tablet Monday through  Thursday,, skip Friday, Saturdays and  Sundays   metoprolol  succinate (TOPROL -XL) 25 MG 24 hr tablet Take 0.5 tablets (12.5 mg total) by mouth daily. For increased heart rate   naloxone (NARCAN) nasal spray 4 mg/0.1 mL 1 (ONE) SPRAY AS NEEDED   naproxen  (NAPROSYN ) 500 MG tablet TAKE 1 TABLET BY MOUTH TWICE A DAY AS NEEDED FOR PAIN   oxyCODONE -acetaminophen  (PERCOCET/ROXICET) 5-325 MG tablet Take 1 tablet by mouth 4 (four) times daily.   potassium chloride  (KLOR-CON ) 10 MEQ tablet Take by mouth.   rizatriptan  (MAXALT ) 10 MG tablet Take 1 tablet (10 mg total) by mouth as needed for migraine. May repeat in 2 hours if needed   sucralfate  (CARAFATE ) 1 GM/10ML suspension Take 10 mLs (1 g  total) by mouth 4 (four) times daily. Take 2 teaspoons with each meal and 2 teaspoons at bedtime. Do not take within 2 hours of any other medications   sucralfate  (CARAFATE ) 1 GM/10ML suspension Take 10 mLs (1 g total) by mouth 4 (four) times daily.   topiramate  (TOPAMAX ) 50 MG tablet Take 1 tablet (50 mg total) by mouth 2 (two) times daily.   venlafaxine  XR (EFFEXOR  XR) 75 MG 24 hr capsule Take 1 capsule (75 mg total) by mouth daily with breakfast.   Vitamin D , Ergocalciferol , (DRISDOL) 1.25 MG (50000 UNIT) CAPS capsule Take 50,000 Units by mouth once a week.   zolpidem  (AMBIEN ) 5 MG tablet Take 1 tablet (5 mg total) by mouth at bedtime as needed for sleep.   [DISCONTINUED] medroxyPROGESTERone  (PROVERA ) 10 MG tablet Take 2 tablets by mouth daily (Patient not taking: Reported on 06/05/2019)   Facility-Administered Encounter Medications as of 10/01/2023  Medication   0.9 %  sodium chloride  infusion     ONCOLOGIC FAMILY HISTORY:  Family History  Problem Relation Age of Onset   Hypertension Mother    Hypertension Maternal Aunt    Breast cancer Maternal Grandmother        dx > 50   Stomach cancer Maternal Grandfather        dx > 50   Breast cancer Paternal Grandmother        dx > 50   Esophageal cancer Neg Hx    Rectal cancer Neg Hx    Colon cancer Neg Hx      SOCIAL HISTORY:  Social History   Socioeconomic History   Marital status: Single    Spouse name: Not on file   Number of children: 2   Years of education: Not on file   Highest education level: Not on file  Occupational History   Occupation: Event organiser: [employer]  Tobacco Use   Smoking status: Never   Smokeless tobacco: Never  Vaping Use   Vaping status: Never Used  Substance and Sexual Activity   Alcohol use: No   Drug use: Not Currently    Types: Marijuana    Comment: quit 2018   Sexual activity: Not Currently    Birth control/protection: Surgical    Comment: B.T. L  Other Topics Concern   Not on  file  Social History Narrative   ** Merged History Encounter **       Right Handed  Lives in a one story home    Social Drivers of Health   Financial Resource Strain: Medium Risk (11/03/2021)   Overall Financial Resource Strain (CARDIA)    Difficulty of Paying Living Expenses: Somewhat hard  Food Insecurity: No Food Insecurity (09/27/2023)   Hunger Vital Sign  Worried About Programme researcher, broadcasting/film/video in the Last Year: Never true    Ran Out of Food in the Last Year: Never true  Transportation Needs: No Transportation Needs (09/27/2023)   PRAPARE - Administrator, Civil Service (Medical): No    Lack of Transportation (Non-Medical): No  Physical Activity: Sufficiently Active (11/03/2021)   Exercise Vital Sign    Days of Exercise per Week: 3 days    Minutes of Exercise per Session: 50 min  Stress: Stress Concern Present (11/03/2021)   [employer] of Occupational Health - Occupational Stress Questionnaire    Feeling of Stress : Very much  Social Connections: Moderately Isolated (11/03/2021)   Social Connection and Isolation Panel [NHANES]    Frequency of Communication with Friends and Family: Once a week    Frequency of Social Gatherings with Friends and Family: Once a week    Attends Religious Services: More than 4 times per year    Active Member of [employer] or Organizations: No    Attends Banker Meetings: Never    Marital Status: Living with partner  Intimate Partner Violence: Not At Risk (09/27/2023)   Humiliation, Afraid, Rape, and Kick questionnaire    Fear of Current or Ex-Partner: No    Emotionally Abused: No    Physically Abused: No    Sexually Abused: No     OBSERVATIONS/OBJECTIVE:  BP (!) 111/59 (BP Location: Left Arm, Patient Position: Sitting)   Pulse 91   Temp (!) 97.3 F (36.3 C) (Temporal)   Resp 17   Wt 206 lb (93.4 kg)   SpO2 100%   BMI 33.25 kg/m  GENERAL: Patient is a well appearing female in no acute distress HEENT:  Sclerae  anicteric.  Oropharynx clear and moist. No ulcerations or evidence of oropharyngeal candidiasis. Neck is supple.  NODES:  No cervical, supraclavicular, or axillary lymphadenopathy palpated.  BREAST EXAM:  right breast s/p lumpectomy and radiation, no sign of local recurrence, left breast benign LUNGS:  Clear to auscultation bilaterally.  No wheezes or rhonchi. HEART:  Regular rate and rhythm. No murmur appreciated. ABDOMEN:  Soft, nontender.  Positive, normoactive bowel sounds. No organomegaly palpated. MSK:  No focal spinal tenderness to palpation. Full range of motion bilaterally in the upper extremities. EXTREMITIES:  No peripheral edema.   SKIN:  Clear with no obvious rashes or skin changes. No nail dyscrasia. NEURO:  Nonfocal. Well oriented.  Appropriate affect.   LABORATORY DATA:  None for this visit.  DIAGNOSTIC IMAGING:  None for this visit.      ASSESSMENT AND PLAN:  Laurie Bell is a pleasant 43 y.o. 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  She presents to the Survivorship Clinic for our initial meeting and routine follow-up post-completion of treatment for breast cancer.    1. Stage IB right breast cancer:  Laurie Bell is continuing to recover from definitive treatment for breast cancer. She will follow-up with her medical oncologist, Dr.  Lee Public in  4 months with history and physical exam per surveillance protocol.  Orders for guardant reveal testing placed today.  Her mammogram is due 12/2023, considering her age being less than 29, breast MRI in 06/2024 was recommendeded; orders placed today.   Today, a comprehensive survivorship care plan and treatment summary was reviewed with the patient today detailing her breast cancer diagnosis, treatment course, potential late/long-term effects of treatment, appropriate follow-up care  with  recommendations for the future, and patient education resources.  A copy of this summary, along with a letter will be sent to the patient's primary care provider via mail/fax/In Basket message after today's visit.    2. Neck pain: CT neck and chest were ordered to rule out metastatic triple negative breast cancer recurrence.    3. Headaches: will see Dr. Mark Sil in consultation about this.  MRI brain was negative for intracranial metastases or other identifiable cause.   4. Bone health:   She was given education on specific activities to promote bone health.  5. Cancer screening:  Due to Laurie Bell's history and her age, she should receive screening for skin cancers, colon cancer, and gynecologic cancers.  The information and recommendations are listed on the patient's comprehensive care plan/treatment summary and were reviewed in detail with the patient.    6. Health maintenance and wellness promotion: Laurie Bell was encouraged to consume 5-7 servings of fruits and vegetables per day. We reviewed the "Nutrition Rainbow" handout.  She was also encouraged to engage in moderate to vigorous exercise for 30 minutes per day most days of the week.  She was instructed to limit her alcohol consumption and continue to abstain from tobacco use.     7. Support services/counseling: It is not uncommon for this period of the patient's cancer care trajectory to be one of many emotions and stressors.   She was given information regarding our available services and encouraged to contact me with any questions or for help enrolling in any of our support group/programs.    Follow up instructions:    -Return to cancer center 4 months  -Mammogram due in 12/2023 -Breast MRI 06/2024 -Guardant Reveal testing every 6 months -CT chest and neck ordered -She is welcome to return back to the Survivorship Clinic at any time; no additional follow-up needed at this time.  -Consider referral back to survivorship as a long-term  survivor for continued surveillance  The patient was provided an opportunity to ask questions and all were answered. The patient agreed with the plan and demonstrated an understanding of the instructions.   Total encounter time:60 minutes*in face-to-face visit time, chart review, lab review, care coordination, order entry, and documentation of the encounter time.    Alwin Baars, NP 10/01/23 9:15 AM Medical Oncology and Hematology [facility] [address] [place], [place] [address] Tel. [phone]    Fax. [phone]  *Total Encounter Time as defined by the Centers for Medicare and Medicaid Services includes, in addition to the face-to-face time of a patient visit (documented in the note above) non-face-to-face time: obtaining and reviewing outside history, ordering and reviewing medications, tests or procedures, care coordination (communications with other health care professionals or caregivers) and documentation in the medical record.

## 2023-10-01 NOTE — Patient Outreach (Signed)
 Complex Care Management   Visit Note  09/27/2023  Name:  Laurie Bell MRN: [id number] DOB: 1980/10/29  Situation: Referral received for Complex Care Management related to [facility]/Behavioral Health diagnosis Anxiety Symptoms  I obtained verbal consent from Patient.  Visit completed with pt  on the phone  Background:   Past Medical History:  Diagnosis Date   Anxiety    Arthritis    Breast cancer (HCC)    Chlamydia contact, treated    GERD (gastroesophageal reflux disease)    Hyperthyroidism     Assessment: Patient Reported Symptoms:  Cognitive Cognitive Status: Alert and oriented to person, place, and time      Neurological Neurological Review of Symptoms: No symptoms reported    HEENT HEENT Symptoms Reported: No symptoms reported      Cardiovascular Cardiovascular Symptoms Reported: No symptoms reported    Respiratory Respiratory Symptoms Reported: No symptoms reported    Endocrine Patient reports the following symptoms related to hypoglycemia or hyperglycemia : No symptoms reported    Gastrointestinal Gastrointestinal Symptoms Reported: No symptoms reported      Genitourinary Genitourinary Symptoms Reported: No symptoms reported    Integumentary Integumentary Symptoms Reported: No symptoms reported    Musculoskeletal Musculoskelatal Symptoms Reviewed: No symptoms reported        Psychosocial Psychosocial Symptoms Reported: No symptoms reported   Major Change/Loss/Stressor/Fears (CP): Medical condition, self Techniques to Cope with Loss/Stress/Change: Counseling Quality of Family Relationships: involved, helpful Do you feel physically threatened by others?: No      08/20/2023    9:49 AM  Depression screen PHQ 2/9  Decreased Interest 2  Down, Depressed, Hopeless 0  PHQ - 2 Score 2  Altered sleeping 3  Tired, decreased energy 3  Change in appetite 0  Feeling bad or failure about yourself  0  Trouble concentrating 0  Moving slowly or fidgety/restless 0   Suicidal thoughts 0  PHQ-9 Score 8    There were no vitals filed for this visit.  Medications Reviewed Today     Reviewed by Adriana Albany, LCSW (Social Worker) on 09/27/23 at [phone]  Med List Status: <None>   Medication Order Taking? Sig Documenting Provider Last Dose Status Informant  0.9 %  sodium chloride  infusion [id number]   Dorsey, Ying C, MD  Active   acetaminophen  (TYLENOL ) 500 MG tablet [id number] Yes Take 1,000 mg by mouth every 6 (six) hours as needed for moderate pain. [provider] Taking Active Self, Pharmacy Records  albuterol  (VENTOLIN  HFA) 108 (90 Base) MCG/ACT inhaler [id number] Yes TAKE 2 PUFFS BY MOUTH EVERY 6 HOURS AS NEEDED FOR WHEEZE OR SHORTNESS OF BREATH  Patient taking differently: Inhale 2 puffs into the lungs every 6 (six) hours as needed for wheezing or shortness of breath.   Newlin, Enobong, MD Taking Active Self, Pharmacy Records  cefdinir (OMNICEF) 300 MG capsule [id number] Yes Take 300 mg by mouth 2 (two) times daily. [provider] Taking Active   cyclobenzaprine  (FLEXERIL ) 10 MG tablet [id number] Yes Take 1 tablet (10 mg total) by mouth 2 (two) times daily as needed for muscle spasms. TAKE 1 TABLET BY MOUTH AT BEDTIME AS NEEDED FOR MUSCLE SPASMS Joaquin Mulberry, MD Taking Active   esomeprazole  (NEXIUM ) 40 MG capsule [id number] Yes Take 1 capsule (40 mg total) by mouth 2 (two) times daily before a meal. Laurie Drum, MD Taking Active   famotidine  (PEPCID ) 40 MG tablet [id number] Yes Take 1 tablet (40 mg total) by mouth at bedtime. Suzanna Erp  M, PA-C Taking Active   gabapentin  (NEURONTIN ) 400 MG capsule [id number] Yes TAKE 1 CAPSULE BY MOUTH IN AM AND NOON, AND 2 CAPSULE BY MOUTH AT BEDTIME Gudena, Vinay, MD Taking Active   hydrOXYzine  (ATARAX ) 25 MG tablet [id number] Yes TAKE 1 TABLET(25 MG) BY MOUTH THREE TIMES DAILY Newlin, Enobong, MD Taking Active   ipratropium (ATROVENT ) 0.03 % nasal spray [id number] Yes Place into the nose.  [provider] Taking Active   linaclotide  (LINZESS ) 145 MCG CAPS capsule [id number] Yes Take 1 capsule (145 mcg total) by mouth daily before breakfast.  Patient taking differently: Take 145 mcg by mouth as needed.   Laurie Drum, MD Taking Active Self, Pharmacy Records   Patient not taking:   Discontinued 02/02/20 1436 methimazole  (TAPAZOLE ) 5 MG tablet [id number] Yes Take 1 tablet (5 mg total) by mouth as directed. 1 tablet Monday through Thursday,, skip Friday, Saturdays and  Sundays Shamleffer, Julian Obey, MD Taking Active Self, Pharmacy Records  metoprolol  succinate (TOPROL -XL) 25 MG 24 hr tablet [id number] Yes Take 0.5 tablets (12.5 mg total) by mouth daily. For increased heart rate Joaquin Mulberry, MD Taking Active   naloxone [facility]) nasal spray 4 mg/0.1 mL [id number] Yes 1 (ONE) SPRAY AS NEEDED [provider] Taking Active   naproxen  (NAPROSYN ) 500 MG tablet [id number] Yes TAKE 1 TABLET BY MOUTH TWICE A DAY AS NEEDED FOR PAIN Joaquin Mulberry, MD Taking Active Self, Pharmacy Records  oxyCODONE -acetaminophen  (PERCOCET/ROXICET) 5-325 MG tablet [id number] Yes Take 1 tablet by mouth 4 (four) times daily. [provider] Taking Active Self, Pharmacy Records  potassium chloride  (KLOR-CON ) 10 MEQ tablet [id number]  Take by mouth. [provider]  Active   rizatriptan  (MAXALT ) 10 MG tablet [id number] Yes Take 1 tablet (10 mg total) by mouth as needed for migraine. May repeat in 2 hours if needed Newlin, Enobong, MD Taking Active   sucralfate  (CARAFATE ) 1 GM/10ML suspension [id number] Yes Take 10 mLs (1 g total) by mouth 4 (four) times daily. Take 2 teaspoons with each meal and 2 teaspoons at bedtime. Do not take within 2 hours of any other medications Tory Freiberg, NP Taking Active   sucralfate  (CARAFATE ) 1 GM/10ML suspension [id number] Yes Take 10 mLs (1 g total) by mouth 4 (four) times daily. Laurie Drum, MD Taking Active   topiramate  (TOPAMAX ) 50 MG  tablet [id number] Yes Take 1 tablet (50 mg total) by mouth 2 (two) times daily. Newlin, Enobong, MD Taking Active   venlafaxine  XR (EFFEXOR  XR) 75 MG 24 hr capsule [id number] Yes Take 1 capsule (75 mg total) by mouth daily with breakfast. Joaquin Mulberry, MD Taking Active   Vitamin D , Ergocalciferol , (DRISDOL) 1.25 MG (50000 UNIT) CAPS capsule [id number] Yes Take 50,000 Units by mouth once a week. [provider] Taking Active Self, Pharmacy Records  zolpidem  (AMBIEN ) 5 MG tablet [id number] Yes Take 1 tablet (5 mg total) by mouth at bedtime as needed for sleep. Gudena, Vinay, MD Taking Active Self, Pharmacy Records            Recommendation:   Continue utilizing strategies discussed to assist with symptom management  Follow Up Plan:   Telephone follow-up in 1 month  Alease Hunter, LCSW [facility]  [facility], [facility] Clinical Social Worker Direct Dial: [phone]  Fax: [phone] Website: [email or web address] 7:18 AM

## 2023-10-02 ENCOUNTER — Telehealth: Payer: Self-pay | Admitting: Hematology and Oncology

## 2023-10-02 ENCOUNTER — Encounter: Payer: Self-pay | Admitting: Internal Medicine

## 2023-10-02 MED ORDER — METHIMAZOLE 5 MG PO TABS: 5.0000 mg | ORAL_TABLET | ORAL | 3 refills | Status: AC

## 2023-10-02 NOTE — Telephone Encounter (Signed)
 Spoke with patient confirming upcoming appointment

## 2023-10-03 ENCOUNTER — Inpatient Hospital Stay: Admitting: Nurse Practitioner

## 2023-10-08 ENCOUNTER — Inpatient Hospital Stay (HOSPITAL_BASED_OUTPATIENT_CLINIC_OR_DEPARTMENT_OTHER): Admitting: Internal Medicine

## 2023-10-08 VITALS — BP 126/61 | HR 92 | Temp 97.9°F | Resp 17 | Ht 66.0 in | Wt 204.4 lb

## 2023-10-08 DIAGNOSIS — Z853 Personal history of malignant neoplasm of breast: Secondary | ICD-10-CM | POA: Diagnosis not present

## 2023-10-08 DIAGNOSIS — R519 Headache, unspecified: Secondary | ICD-10-CM | POA: Diagnosis not present

## 2023-10-08 NOTE — Progress Notes (Signed)
 [facility] [address]  [place], [place] [address] [phone]   New Patient Evaluation  Date of Service: 10/08/23 Patient Name: Laurie Bell Patient MRN: [id number] Patient DOB: [birth year] Provider: Mamie Searles, MD  Identifying Statement:  Laurie Bell is a 43 y.o. female with Chronic daily headache who presents for initial consultation and evaluation regarding cancer associated neurologic deficits.    Referring Provider: Joaquin Mulberry, MD [address] [place],  [place] [address]  Primary Cancer:  Oncologic History: 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, ER 30% weak, PR 2%   10/11/2022 Cancer Staging   Staging form: Breast, AJCC 8th Edition - Clinical: Stage 0 (cTis (DCIS), cN0, cM0, G3, ER+, PR+, HER2: Not Assessed) - Signed by Cameron Cea, MD on 10/11/2022 Stage prefix: Initial diagnosis Histologic grading system: 3 grade system   10/11/2022 Genetic Testing   Declined hereditary cancer genetic testing    11/13/2022 Surgery   Right lumpectomy: Grade 3 IDC, 2 cm in size inferior and posterior margins are positive, DCIS posterior margin positive, lymphovascular invasion not identified, ER 0%, PR 0%, Ki-67 85%, HER2 0 by [facility]   11/13/2022 Cancer Staging   Staging form: Breast, AJCC 8th Edition - Pathologic stage from 11/13/2022: Stage IB (pT1c, pN0, cM0, G3, ER-, PR-, HER2-) - Signed by Colie Dawes, MD on 05/21/2023 Stage prefix: Initial diagnosis Histologic grading system: 3 grade system   12/01/2022 Surgery   Margin excision: Posterior margin: Scattered foci of high-grade DCIS, 0/2 lymph nodes negative   01/01/2023 - 04/10/2023 Chemotherapy    Patient is on Treatment Plan : BREAST ADJUVANT DOSE DENSE AC q14d / PACLitaxel  q7d     05/29/2023 - 06/28/2023 Radiation Therapy   Plan Name: Breast_R Site: Breast, Right Technique: 3D Mode: Photon Dose Per Fraction: 2.67 Gy Prescribed Dose (Delivered / Prescribed): 40.05 Gy / 40.05 Gy Prescribed Fxs (Delivered / Prescribed): 15 / 15   Plan Name: Breast_R_Bst Site: Breast, Right Technique: 3D Mode: Photon Dose Per Fraction: 2 Gy Prescribed Dose (Delivered / Prescribed): 10 Gy / 10 Gy Prescribed Fxs (Delivered / Prescribed): 5 / 5     History of Present Illness: The patient's records from the referring physician were obtained and reviewed and the patient interviewed to confirm this HPI.  Laurie Bell presents today to review recent headache syndrome.  She describe a 1 year history of near daily continuous headache, started around the time of her cancer diagnosis.  Pain is holocranial or bitemporal, associated with mild photophobia at times but no nausea.  No neurologic deficits.  Has been dosing Tylenol  or oxycodone , but less than daily.  Topamax  has not been dosed regularly, prescribed by her PCP.  Sleep has been very poor, she is up late with the TV on frequently.  Otherwise continues to follow with Dr. Gudena.  Medications: Current Outpatient Medications on File Prior to Visit  Medication Sig Dispense Refill   acetaminophen  (TYLENOL ) 500 MG tablet Take 1,000 mg by mouth every 6 (six) hours as needed for moderate pain.     albuterol  (VENTOLIN  HFA) 108 (90 Base) MCG/ACT inhaler TAKE 2 PUFFS BY MOUTH EVERY 6 HOURS AS NEEDED FOR WHEEZE OR SHORTNESS OF BREATH (Patient taking differently: Inhale 2 puffs  into the lungs every 6 (six) hours as needed for wheezing or shortness of breath.) 54 each 0   cyclobenzaprine  (FLEXERIL ) 10 MG tablet Take 1 tablet (10 mg total) by mouth 2 (two) times daily as needed for muscle spasms. TAKE 1 TABLET BY MOUTH AT BEDTIME AS NEEDED FOR MUSCLE SPASMS 180  tablet 1   esomeprazole  (NEXIUM ) 40 MG capsule Take 1 capsule (40 mg total) by mouth 2 (two) times daily before a meal. 60 capsule 4   famotidine  (PEPCID ) 40 MG tablet Take 1 tablet (40 mg total) by mouth at bedtime. 30 tablet 2   gabapentin  (NEURONTIN ) 400 MG capsule TAKE 1 CAPSULE BY MOUTH IN AM AND NOON, AND 2 CAPSULE BY MOUTH AT BEDTIME 56 capsule 0   hydrOXYzine  (ATARAX ) 25 MG tablet TAKE 1 TABLET(25 MG) BY MOUTH THREE TIMES DAILY 270 tablet 1   ipratropium (ATROVENT ) 0.03 % nasal spray Place into the nose.     linaclotide  (LINZESS ) 145 MCG CAPS capsule Take 1 capsule (145 mcg total) by mouth daily before breakfast. (Patient taking differently: Take 145 mcg by mouth as needed.) 30 capsule 5   methimazole  (TAPAZOLE ) 5 MG tablet Take 1 tablet (5 mg total) by mouth as directed. 1 tablet Monday through Thursday, skip rest of the week 52 tablet 3   metoprolol  succinate (TOPROL -XL) 25 MG 24 hr tablet Take 0.5 tablets (12.5 mg total) by mouth daily. For increased heart rate 45 tablet 1   naloxone (NARCAN) nasal spray 4 mg/0.1 mL 1 (ONE) SPRAY AS NEEDED     naproxen  (NAPROSYN ) 500 MG tablet TAKE 1 TABLET BY MOUTH TWICE A DAY AS NEEDED FOR PAIN 60 tablet 1   oxyCODONE -acetaminophen  (PERCOCET/ROXICET) 5-325 MG tablet Take 1 tablet by mouth 4 (four) times daily.     potassium chloride  (KLOR-CON ) 10 MEQ tablet Take by mouth.     rizatriptan  (MAXALT ) 10 MG tablet Take 1 tablet (10 mg total) by mouth as needed for migraine. May repeat in 2 hours if needed 10 tablet 2   sucralfate  (CARAFATE ) 1 GM/10ML suspension Take 10 mLs (1 g total) by mouth 4 (four) times daily. Take 2 teaspoons with each meal and 2 teaspoons at bedtime. Do not take within 2 hours of any other medications 400 mL 0   sucralfate  (CARAFATE ) 1 GM/10ML suspension Take 10 mLs (1 g total) by mouth 4 (four) times daily. 1200 mL 0   topiramate  (TOPAMAX ) 50 MG tablet Take 1 tablet (50 mg total) by mouth 2 (two) times daily. 180 tablet 1    venlafaxine  XR (EFFEXOR  XR) 75 MG 24 hr capsule Take 1 capsule (75 mg total) by mouth daily with breakfast. 90 capsule 1   Vitamin D , Ergocalciferol , (DRISDOL) 1.25 MG (50000 UNIT) CAPS capsule Take 50,000 Units by mouth once a week.     zolpidem  (AMBIEN ) 5 MG tablet Take 1 tablet (5 mg total) by mouth at bedtime as needed for sleep. 30 tablet 3   [DISCONTINUED] medroxyPROGESTERone  (PROVERA ) 10 MG tablet Take 2 tablets by mouth daily (Patient not taking: Reported on 06/05/2019) 60 tablet 0   Current Facility-Administered Medications on File Prior to Visit  Medication Dose Route Frequency Provider Last Rate Last Admin   0.9 %  sodium chloride  infusion  500 mL Intravenous Once Dorsey, Ying C, MD        Allergies:  Allergies  Allergen Reactions   Latex Other (See Comments)    Burn in vaginal area with latex condoms   Tramadol  Other (  See Comments)    States "it messes me up" no further info   Past Medical History:  Past Medical History:  Diagnosis Date   Anxiety    Arthritis    Breast cancer (HCC)    Chlamydia contact, treated    GERD (gastroesophageal reflux disease)    Hyperthyroidism    Past Surgical History:  Past Surgical History:  Procedure Laterality Date   AXILLARY SENTINEL NODE BIOPSY Right 12/01/2022   Procedure: RIGHT AXILLARY SENTINEL NODE BIOPSY;  Surgeon: Oza Blumenthal, MD;  Location: [facility];  Service: General;  Laterality: Right;   BREAST BIOPSY Right 10/02/2022   MM RT BREAST BX W LOC DEV 1ST LESION IMAGE BX SPEC STEREO GUIDE 10/02/2022 [facility]   BREAST BIOPSY  11/10/2022   MM RT RADIOACTIVE SEED LOC MAMMO GUIDE 11/10/2022 [facility]   BREAST LUMPECTOMY WITH RADIOACTIVE SEED LOCALIZATION Right 11/13/2022   Procedure: RIGHT BREAST LUMPECTOMY WITH RADIOACTIVE SEED LOCALIZATION;  Surgeon: Oza Blumenthal, MD;  Location: [facility];  Service: General;  Laterality: Right;   ENDOMETRIAL ABLATION N/A 04/25/2017   Procedure: ENDOMETRIAL  ABLATION With NOVASURE;  Surgeon: Tresia Fruit, MD;  Location: [facility];  Service: Gynecology;  Laterality: N/A;   ESOPHAGEAL MANOMETRY N/A 07/27/2021   Procedure: ESOPHAGEAL MANOMETRY (EM);  Surgeon: Daina Drum, MD;  Location: [facility];  Service: Gastroenterology;  Laterality: N/A;   PORTACATH PLACEMENT N/A 12/01/2022   Procedure: INSERTION PORT-A-CATH WITH ULTRASOUND GUIDANCE;  Surgeon: Oza Blumenthal, MD;  Location: [facility] OR;  Service: General;  Laterality: N/A;   RE-EXCISION OF BREAST CANCER,SUPERIOR MARGINS Right 12/01/2022   Procedure: RE-EXCISION OF RIGHT BREAST CANCER;  Surgeon: Oza Blumenthal, MD;  Location: [facility] OR;  Service: General;  Laterality: Right;   TUBAL LIGATION  2003   Social History:  Social History   Socioeconomic History   Marital status: Single    Spouse name: Not on file   Number of children: 2   Years of education: Not on file   Highest education level: Not on file  Occupational History   Occupation: Event organiser: [employer]  Tobacco Use   Smoking status: Never   Smokeless tobacco: Never  Vaping Use   Vaping status: Never Used  Substance and Sexual Activity   Alcohol use: No   Drug use: Not Currently    Types: Marijuana    Comment: quit 2018   Sexual activity: Not Currently    Birth control/protection: Surgical    Comment: B.T. L  Other Topics Concern   Not on file  Social History Narrative   ** Merged History Encounter **       Right Handed  Lives in a one story home    Social Drivers of Health   Financial Resource Strain: Medium Risk (11/03/2021)   Overall Financial Resource Strain (CARDIA)    Difficulty of Paying Living Expenses: Somewhat hard  Food Insecurity: No Food Insecurity (09/27/2023)   Hunger Vital Sign    Worried About Running Out of Food in the Last Year: Never true    Ran Out of Food in the Last Year: Never true  Transportation Needs: No Transportation Needs (09/27/2023)   PRAPARE -  Administrator, Civil Service (Medical): No    Lack of Transportation (Non-Medical): No  Physical Activity: Sufficiently Active (11/03/2021)   Exercise Vital Sign    Days of Exercise per Week: 3 days    Minutes of Exercise per Session: 50 min  Stress: Stress Concern Present (11/03/2021)   [employer] of Occupational Health - Occupational Stress Questionnaire    Feeling of Stress : Very much  Social Connections: Moderately Isolated (11/03/2021)   Social Connection and Isolation Panel [NHANES]    Frequency of Communication with Friends and Family: Once a week    Frequency of Social Gatherings with Friends and Family: Once a week    Attends Religious Services: More than 4 times per year    Active Member of [employer] or Organizations: No    Attends Banker Meetings: Never    Marital Status: Living with partner  Intimate Partner Violence: Not At Risk (09/27/2023)   Humiliation, Afraid, Rape, and Kick questionnaire    Fear of Current or Ex-Partner: No    Emotionally Abused: No    Physically Abused: No    Sexually Abused: No   Family History:  Family History  Problem Relation Age of Onset   Hypertension Mother    Hypertension Maternal Aunt    Breast cancer Maternal Grandmother        dx > 50   Stomach cancer Maternal Grandfather        dx > 50   Breast cancer Paternal Grandmother        dx > 50   Esophageal cancer Neg Hx    Rectal cancer Neg Hx    Colon cancer Neg Hx     Review of Systems: Constitutional: Doesn't report fevers, chills or abnormal weight loss Eyes: Doesn't report blurriness of vision Ears, nose, mouth, throat, and face: Doesn't report sore throat Respiratory: Doesn't report cough, dyspnea or wheezes Cardiovascular: Doesn't report palpitation, chest discomfort  Gastrointestinal:  Doesn't report nausea, constipation, diarrhea GU: Doesn't report incontinence Skin: Doesn't report skin rashes Neurological: Per HPI Musculoskeletal: Doesn't  report joint pain Behavioral/Psych: Doesn't report anxiety  Physical Exam: Vitals:   10/08/23 1415  BP: 126/61  Pulse: 92  Resp: 17  Temp: 97.9 F (36.6 C)  SpO2: 100%   KPS: 90. General: Alert, cooperative, pleasant, in no acute distress Head: Normal EENT: No conjunctival injection or scleral icterus.  Lungs: Resp effort normal Cardiac: Regular rate Abdomen: Non-distended abdomen Skin: No rashes cyanosis or petechiae. Extremities: No clubbing or edema  Neurologic Exam: Mental Status: Awake, alert, attentive to examiner. Oriented to self and environment. Language is fluent with intact comprehension.  Cranial Nerves: Visual acuity is grossly normal. Visual fields are full. Extra-ocular movements intact. No ptosis. Face is symmetric Motor: Tone and bulk are normal. Power is full in both arms and legs. Reflexes are symmetric, no pathologic reflexes present.  Sensory: Intact to light touch Gait: Normal.   Labs: I have reviewed the data as listed    Component Value Date/Time   NA 140 08/30/2023 1936   NA 138 01/18/2022 1512   K 3.3 (L) 08/30/2023 1936   CL 104 08/30/2023 1936   CO2 25 08/30/2023 1936   GLUCOSE 97 08/30/2023 1936   BUN 10 08/30/2023 1936   BUN 9 01/18/2022 1512   CREATININE 0.85 08/30/2023 1936   CREATININE 0.74 05/08/2023 1121   CALCIUM  9.7 08/30/2023 1936   PROT 7.5 07/05/2023 1448   PROT 6.9 04/15/2021 0942   ALBUMIN 3.8 07/05/2023 1448   ALBUMIN 4.3 04/15/2021 0942   AST 28 07/05/2023 1448   AST 39 05/08/2023 1121   ALT 28 07/05/2023 1448   ALT 62 (H) 05/08/2023 1121   ALKPHOS 151 (H) 07/05/2023 1448   BILITOT 0.5 07/05/2023 1448  BILITOT 0.5 05/08/2023 1121   GFRNONAA >60 08/30/2023 1936   GFRNONAA >60 05/08/2023 1121   GFRAA >60 02/01/2020 2233   Lab Results  Component Value Date   WBC 9.7 08/30/2023   NEUTROABS 2.7 07/05/2023   HGB 12.4 08/30/2023   HCT 38.3 08/30/2023   MCV 82.5 08/30/2023   PLT 271 08/30/2023     Imaging: CLINICAL DATA:  Provided history: Malignant neoplasm of upper-outer quadrant of right female breast in female, estrogen receptor negative. Brain metastases suspected.   EXAM: MRI HEAD WITHOUT AND WITH CONTRAST   TECHNIQUE: Multiplanar, multiecho pulse sequences of the brain and surrounding structures were obtained without and with intravenous contrast.   CONTRAST:  10mL GADAVIST  GADOBUTROL  1 MMOL/ML IV SOLN   COMPARISON:  Head CT 07/05/2023.  Brain MRI 06/25/2021.   FINDINGS: Brain:   No age-advanced or lobar predominant parenchymal atrophy.   No cortical encephalomalacia is identified. No significant cerebral white matter disease.   There is no acute infarct.   No evidence of an intracranial mass.   No chronic intracranial blood products.   No extra-axial fluid collection.   No midline shift.   No pathologic intracranial enhancement identified.   Vascular: Maintained flow voids within the proximal large arterial vessels. Small developmental venous anomaly within the right cerebellar hemisphere.   Skull and upper cervical spine: No focal worrisome marrow lesion.   Sinuses/Orbits: No mass or acute finding within the imaged orbits. No significant paranasal sinus disease.   IMPRESSION: No evidence of intracranial metastatic disease.     Electronically Signed   By: Bascom Lily D.O.   On: 07/18/2023 10:52  Assessment/Plan Chronic daily headache  Laurie Bell presents with clinical syndrome c/w chronic daily headache.  Etiology is likely sleep issues.  MRI brain was reviewed, is normal.   We extensively discussed sleep hygiene, reviewing CDC guidelines.  Also discussed some sleep-CBT strategies to maximize physical and mental restfulness.  Ok with continued PRN use of pain medications and daily Topamax  for now; the goal will be resolution of headaches and discontinuing of these medications.   We spent twenty additional minutes teaching  regarding the natural history, biology, and historical experience in the treatment of neurologic complications of cancer.   We appreciate the opportunity to participate in the care of Laurie Bell.   We ask that Laurie Bell return to clinic in 3 months for headache follow up, or sooner as needed.  All questions were answered. The patient knows to call the clinic with any problems, questions or concerns. No barriers to learning were detected.  The total time spent in the encounter was 40 minutes and more than 50% was on counseling and review of test results   Mamie Searles, MD Medical Director of Neuro-Oncology [facility] [place] 10/08/23 3:33 PM

## 2023-10-10 ENCOUNTER — Inpatient Hospital Stay: Admitting: Physician Assistant

## 2023-10-11 ENCOUNTER — Encounter: Payer: Self-pay | Admitting: Adult Health

## 2023-10-15 ENCOUNTER — Other Ambulatory Visit

## 2023-10-17 ENCOUNTER — Ambulatory Visit: Attending: Physician Assistant | Admitting: Physician Assistant

## 2023-10-17 ENCOUNTER — Encounter (HOSPITAL_COMMUNITY): Payer: Self-pay

## 2023-10-17 VITALS — BP 110/80 | HR 97 | Temp 98.3°F | Ht 66.0 in | Wt 202.0 lb

## 2023-10-17 DIAGNOSIS — C50411 Malignant neoplasm of upper-outer quadrant of right female breast: Secondary | ICD-10-CM

## 2023-10-17 DIAGNOSIS — R002 Palpitations: Secondary | ICD-10-CM | POA: Diagnosis not present

## 2023-10-17 DIAGNOSIS — R0981 Nasal congestion: Secondary | ICD-10-CM | POA: Diagnosis not present

## 2023-10-17 DIAGNOSIS — E876 Hypokalemia: Secondary | ICD-10-CM

## 2023-10-17 DIAGNOSIS — Z171 Estrogen receptor negative status [ER-]: Secondary | ICD-10-CM

## 2023-10-17 MED ORDER — FLUTICASONE PROPIONATE 50 MCG/ACT NA SUSP: 2.0000 | Freq: Every day | NASAL | 6 refills | Status: AC

## 2023-10-17 NOTE — Progress Notes (Signed)
 Patient ID: Laurie Bell, female   DOB: 10-04-1980, 43 y.o.   MRN: [id number]   Laurie Bell, is a 43 y.o. female  MVH:[id number]  [id number]:[id number]  DOB - August 21, 1980  Chief Complaint  Patient presents with   Follow-up    ER f/u.  Concern of knots of L shoulder - Imaging ordered by different provider SOB, itchy throat, headache, congestion / sinus concerns x1 mo       Subjective:   Laurie Bell is a 43 y.o. female here today for check in after ED visit in March for palpitations.  She has not been having chest pain but has felt occasional palpitations(EKG showed occasional PACs).  She has not seen her cardiologist since breast CA diagnosis/chemo and radiation.  She feels tired all the time.  Last CBC and recent TSH WNL.  No SOB.  She is having sinus congestion and had [facility] and given a [facility] shot.  Congestion seems to be coming back with some PND.  No discolored drainage.    Has imaging scheduled tomorrow for concerns with fullness in base of neck.    No problems updated.  ALLERGIES: Allergies  Allergen Reactions   Latex Other (See Comments)    Burn in vaginal area with latex condoms   Tramadol  Other (See Comments)    States "it messes me up" no further info    PAST MEDICAL HISTORY: Past Medical History:  Diagnosis Date   Anxiety    Arthritis    Breast cancer (HCC)    Chlamydia contact, treated    GERD (gastroesophageal reflux disease)    Hyperthyroidism     MEDICATIONS AT HOME: Prior to Admission medications   Medication Sig Start Date End Date Taking? Authorizing Provider  fluticasone  (FLONASE ) 50 MCG/ACT nasal spray Place 2 sprays into both nostrils daily. 10/17/23  Yes Cionna Collantes M, PA-C  acetaminophen  (TYLENOL ) 500 MG tablet Take 1,000 mg by mouth every 6 (six) hours as needed for moderate pain.    [provider]  albuterol  (VENTOLIN  HFA) 108 (90 Base) MCG/ACT inhaler TAKE 2 PUFFS BY MOUTH EVERY 6 HOURS AS NEEDED  FOR WHEEZE OR SHORTNESS OF BREATH Patient taking differently: Inhale 2 puffs into the lungs every 6 (six) hours as needed for wheezing or shortness of breath. 12/22/22   Newlin, Enobong, MD  cyclobenzaprine  (FLEXERIL ) 10 MG tablet Take 1 tablet (10 mg total) by mouth 2 (two) times daily as needed for muscle spasms. TAKE 1 TABLET BY MOUTH AT BEDTIME AS NEEDED FOR MUSCLE SPASMS 08/20/23   Newlin, Enobong, MD  esomeprazole  (NEXIUM ) 40 MG capsule Take 1 capsule (40 mg total) by mouth 2 (two) times daily before a meal. 07/04/23   Daina Drum, MD  famotidine  (PEPCID ) 40 MG tablet Take 1 tablet (40 mg total) by mouth at bedtime. 08/31/23   McMichael, Bayley M, PA-C  gabapentin  (NEURONTIN ) 400 MG capsule TAKE 1 CAPSULE BY MOUTH IN AM AND NOON, AND 2 CAPSULE BY MOUTH AT BEDTIME 09/04/23   Cameron Cea, MD  hydrOXYzine  (ATARAX ) 25 MG tablet TAKE 1 TABLET(25 MG) BY MOUTH THREE TIMES DAILY 08/20/23   Newlin, Enobong, MD  ipratropium (ATROVENT ) 0.03 % nasal spray Place into the nose. 12/20/22   [provider]  linaclotide  (LINZESS ) 145 MCG CAPS capsule Take 1 capsule (145 mcg total) by mouth daily before breakfast. Patient taking differently: Take 145 mcg by mouth as needed. 05/19/22   Daina Drum, MD  methimazole  (TAPAZOLE ) 5  MG tablet Take 1 tablet (5 mg total) by mouth as directed. 1 tablet Monday through Thursday, skip rest of the week 10/02/23   Shamleffer, Ibtehal Jaralla, MD  metoprolol  succinate (TOPROL -XL) 25 MG 24 hr tablet Take 0.5 tablets (12.5 mg total) by mouth daily. For increased heart rate 08/20/23   Newlin, Enobong, MD  naloxone [facility]) nasal spray 4 mg/0.1 mL 1 (ONE) SPRAY AS NEEDED 07/13/23   [provider]  naproxen  (NAPROSYN ) 500 MG tablet TAKE 1 TABLET BY MOUTH TWICE A DAY AS NEEDED FOR PAIN 03/19/23   Newlin, Enobong, MD  oxyCODONE -acetaminophen  (PERCOCET/ROXICET) 5-325 MG tablet Take 1 tablet by mouth 4 (four) times daily. 07/13/23   [provider]  potassium  chloride (KLOR-CON ) 10 MEQ tablet Take by mouth. 08/08/22   [provider]  rizatriptan  (MAXALT ) 10 MG tablet Take 1 tablet (10 mg total) by mouth as needed for migraine. May repeat in 2 hours if needed 08/20/23   Newlin, Enobong, MD  sucralfate  (CARAFATE ) 1 GM/10ML suspension Take 10 mLs (1 g total) by mouth 4 (four) times daily. Take 2 teaspoons with each meal and 2 teaspoons at bedtime. Do not take within 2 hours of any other medications 08/09/23   Tory Freiberg, NP  sucralfate  (CARAFATE ) 1 GM/10ML suspension Take 10 mLs (1 g total) by mouth 4 (four) times daily. 09/17/23 10/17/23  Daina Drum, MD  topiramate  (TOPAMAX ) 50 MG tablet Take 1 tablet (50 mg total) by mouth 2 (two) times daily. 08/20/23   Newlin, Enobong, MD  venlafaxine  XR (EFFEXOR  XR) 75 MG 24 hr capsule Take 1 capsule (75 mg total) by mouth daily with breakfast. 08/20/23   Joaquin Mulberry, MD  Vitamin D , Ergocalciferol , (DRISDOL) 1.25 MG (50000 UNIT) CAPS capsule Take 50,000 Units by mouth once a week. 07/19/23   [provider]  zolpidem  (AMBIEN ) 5 MG tablet Take 1 tablet (5 mg total) by mouth at bedtime as needed for sleep. 06/08/23   Cameron Cea, MD  medroxyPROGESTERone  (PROVERA ) 10 MG tablet Take 2 tablets by mouth daily Patient not taking: Reported on 06/05/2019 02/06/18 02/02/20  Jan Mcgill, MD    ROS: Neg resp Neg GI Neg GU Neg MS Neg psych Neg neuro  Objective:   Vitals:   10/17/23 1520  BP: 110/80  Pulse: 97  Temp: 98.3 F (36.8 C)  TempSrc: Oral  SpO2: 99%  Weight: 202 lb (91.6 kg)  Height: 5\' 6"  (1.676 m)   Exam General appearance : Awake, alert, not in any distress. Speech Clear. Not toxic looking HEENT: Atraumatic and Normocephalic, pupils equally reactive to light and accomodation, B TM congested.  Sinuses congested.   Neck: Supple, no JVD. No cervical lymphadenopathy. No occipital nodes.  No virchow's nodes appreciated.   Chest: Good air entry bilaterally, CTAB.  No  rales/rhonchi/wheezing CVS: S1 S2 regular, no murmurs.  Extremities: B/L Lower Ext shows no edema, both legs are warm to touch Neurology: Awake alert, and oriented X 3, CN II-XII intact, Non focal Skin: No Rash  Data Review Lab Results  Component Value Date   HGBA1C 5.2 05/08/2023   HGBA1C 5.6 01/18/2022   HGBA1C  12/24/2007    5.2 (NOTE)   The ADA recommends the following therapeutic goal for glycemic   control related to Hgb A1C measurement:   Goal of Therapy:   < 7.0% Hgb A1C   Reference: American Diabetes Association: Clinical Practice   Recommendations 2008, Diabetes Care,  2008, 31:(Suppl 1).  Assessment & Plan   1. Sinus congestion (Primary) - fluticasone  (FLONASE ) 50 MCG/ACT nasal spray; Place 2 sprays into both nostrils daily.  Dispense: 16 g; Refill: 6  2. Malignant neoplasm of upper-outer quadrant of right breast in female, estrogen receptor negative (HCC) Followed by oncology s/p chemo and radiation  3. Palpitations Continue metoprolol  and TSH is normal on methimazole  - Ambulatory referral to Cardiology  4. Hypokalemia - Basic Metabolic Panel    Return in about 3 months (around 01/17/2024) for PCP for chronic conditions.  The patient was given clear instructions to go to ER or return to medical center if symptoms don't improve, worsen or new problems develop. The patient verbalized understanding. The patient was told to call to get lab results if they haven't heard anything in the next week.      Dulce Gibbs, PA-C [facility] [place], [place] [phone]   10/17/2023, 3:41 PM

## 2023-10-18 ENCOUNTER — Encounter: Payer: Self-pay | Admitting: Adult Health

## 2023-10-18 ENCOUNTER — Encounter: Payer: Self-pay | Admitting: Physician Assistant

## 2023-10-18 ENCOUNTER — Ambulatory Visit
Admission: RE | Admit: 2023-10-18 | Discharge: 2023-10-18 | Disposition: A | Discharge status: Home / Self Care | Source: Ambulatory Visit | Attending: Adult Health | Admitting: Adult Health

## 2023-10-18 DIAGNOSIS — R918 Other nonspecific abnormal finding of lung field: Secondary | ICD-10-CM

## 2023-10-18 DIAGNOSIS — C50411 Malignant neoplasm of upper-outer quadrant of right female breast: Secondary | ICD-10-CM

## 2023-10-18 DIAGNOSIS — M542 Cervicalgia: Secondary | ICD-10-CM

## 2023-10-18 LAB — BASIC METABOLIC PANEL WITH GFR
BUN/Creatinine Ratio: 9 (ref 9–23)
BUN: 8 mg/dL (ref 6–24)
CO2: 22 mmol/L (ref 20–29)
Calcium: 9.7 mg/dL (ref 8.7–10.2)
Chloride: 102 mmol/L (ref 96–106)
Creatinine, Ser: 0.9 mg/dL (ref 0.57–1.00)
Glucose: 91 mg/dL (ref 70–99)
Potassium: 3.7 mmol/L (ref 3.5–5.2)
Sodium: 141 mmol/L (ref 134–144)
eGFR: 81 mL/min/{1.73_m2} (ref >59)

## 2023-10-18 MED ORDER — IOPAMIDOL (ISOVUE-300) INJECTION 61%
80.0000 mL | Freq: Once | INTRAVENOUS | Status: AC | PRN
  Administered 2023-10-18: 80 mL via INTRAVENOUS

## 2023-10-22 ENCOUNTER — Encounter: Payer: Self-pay | Admitting: Adult Health

## 2023-10-23 ENCOUNTER — Other Ambulatory Visit: Payer: Self-pay

## 2023-10-23 ENCOUNTER — Emergency Department (HOSPITAL_BASED_OUTPATIENT_CLINIC_OR_DEPARTMENT_OTHER)
Admission: EM | Admit: 2023-10-23 | Discharge: 2023-10-23 | Disposition: A | Discharge status: Home / Self Care | Attending: Emergency Medicine | Admitting: Emergency Medicine

## 2023-10-23 ENCOUNTER — Ambulatory Visit: Payer: Self-pay | Admitting: *Deleted

## 2023-10-23 ENCOUNTER — Encounter (HOSPITAL_BASED_OUTPATIENT_CLINIC_OR_DEPARTMENT_OTHER): Payer: Self-pay | Admitting: Emergency Medicine

## 2023-10-23 DIAGNOSIS — Z853 Personal history of malignant neoplasm of breast: Secondary | ICD-10-CM | POA: Diagnosis not present

## 2023-10-23 DIAGNOSIS — M79602 Pain in left arm: Secondary | ICD-10-CM

## 2023-10-23 DIAGNOSIS — Z91040 Latex allergy status: Secondary | ICD-10-CM | POA: Diagnosis not present

## 2023-10-23 DIAGNOSIS — M79622 Pain in left upper arm: Secondary | ICD-10-CM | POA: Diagnosis not present

## 2023-10-23 DIAGNOSIS — M25512 Pain in left shoulder: Secondary | ICD-10-CM | POA: Diagnosis present

## 2023-10-23 LAB — CBC WITH DIFFERENTIAL/PLATELET
Abs Immature Granulocytes: 0.02 10*3/uL (ref 0.00–0.07)
Basophils Absolute: 0 10*3/uL (ref 0.0–0.1)
Basophils Relative: 0 %
Eosinophils Absolute: 0.1 10*3/uL (ref 0.0–0.5)
Eosinophils Relative: 1 %
HCT: 37.2 % (ref 36.0–46.0)
Hemoglobin: 12.4 g/dL (ref 12.0–15.0)
Immature Granulocytes: 0 %
Lymphocytes Relative: 31 %
Lymphs Abs: 2.4 10*3/uL (ref 0.7–4.0)
MCH: 27 pg (ref 26.0–34.0)
MCHC: 33.3 g/dL (ref 30.0–36.0)
MCV: 80.9 fL (ref 80.0–100.0)
Monocytes Absolute: 0.6 10*3/uL (ref 0.1–1.0)
Monocytes Relative: 7 %
Neutro Abs: 4.6 10*3/uL (ref 1.7–7.7)
Neutrophils Relative %: 61 %
Platelets: 254 10*3/uL (ref 150–400)
RBC: 4.6 MIL/uL (ref 3.87–5.11)
RDW: 13.6 % (ref 11.5–15.5)
WBC: 7.7 10*3/uL (ref 4.0–10.5)
nRBC: 0 % (ref 0.0–0.2)

## 2023-10-23 LAB — BASIC METABOLIC PANEL WITH GFR
Anion gap: 11 (ref 5–15)
BUN: 11 mg/dL (ref 6–20)
CO2: 27 mmol/L (ref 22–32)
Calcium: 9.9 mg/dL (ref 8.9–10.3)
Chloride: 103 mmol/L (ref 98–111)
Creatinine, Ser: 0.82 mg/dL (ref 0.44–1.00)
GFR, Estimated: >60 mL/min (ref >60)
Glucose, Bld: 94 mg/dL (ref 70–99)
Potassium: 3.3 mmol/L — ABNORMAL LOW (ref 3.5–5.1)
Sodium: 141 mmol/L (ref 135–145)

## 2023-10-23 MED ORDER — NAPROXEN 250 MG PO TABS
500.0000 mg | ORAL_TABLET | Freq: Once | ORAL | Status: AC
  Administered 2023-10-23: 500 mg via ORAL
  Filled 2023-10-23: qty 2

## 2023-10-23 NOTE — Telephone Encounter (Signed)
 Per Alwin Baars, NP, called pt with message below. Pt was called and explained both CT and CT soft tissue results and advised that though there was a thyroid  nodule there is no cancer present. Pt verbalized understanding.

## 2023-10-23 NOTE — ED Triage Notes (Signed)
 Pt POV reporting persistent pain after having port removed in Feb, pain around old site, down L arm and L leg. Prescribed muscle relaxers and lidocaine  patches with minimal relief. CT scan 5/8 neg.

## 2023-10-23 NOTE — ED Provider Notes (Signed)
 [place] [facility]ERGENCY DEPARTMENT AT [facility] Provider Note   CSN: [id number] Arrival date & time: 10/23/23  1809     History  Chief Complaint  Patient presents with   Shoulder Pain    Laurie Bell is a 43 y.o. female.  HPI    43 year old female comes in with chief complaint of shoulder pain.  Patient has previous history of breast cancer and left-sided chest port.  She comes in because of left upper extremity pain that is new.  She states that she has been taking pain medicine without relief.[name] was removed in February.  Patient has been taking muscle relaxants as well.  Patient states that she was diagnosed with neuropathy and she has had chest scans recently for her neck pain which were negative.  She is concerned about blood clot.  Home Medications Prior to Admission medications   Medication Sig Start Date End Date Taking? Authorizing Provider  acetaminophen  (TYLENOL ) 500 MG tablet Take 1,000 mg by mouth every 6 (six) hours as needed for moderate pain.    [provider]  albuterol  (VENTOLIN  HFA) 108 (90 Base) MCG/ACT inhaler TAKE 2 PUFFS BY MOUTH EVERY 6 HOURS AS NEEDED FOR WHEEZE OR SHORTNESS OF BREATH Patient taking differently: Inhale 2 puffs into the lungs every 6 (six) hours as needed for wheezing or shortness of breath. 12/22/22   Newlin, Enobong, MD  cyclobenzaprine  (FLEXERIL ) 10 MG tablet Take 1 tablet (10 mg total) by mouth 2 (two) times daily as needed for muscle spasms. TAKE 1 TABLET BY MOUTH AT BEDTIME AS NEEDED FOR MUSCLE SPASMS 08/20/23   Newlin, Enobong, MD  esomeprazole  (NEXIUM ) 40 MG capsule Take 1 capsule (40 mg total) by mouth 2 (two) times daily before a meal. 07/04/23   Daina Drum, MD  famotidine  (PEPCID ) 40 MG tablet Take 1 tablet (40 mg total) by mouth at bedtime. 08/31/23   McMichael, Elba Greathouse, PA-C  fluticasone  (FLONASE ) 50 MCG/ACT nasal spray Place 2 sprays into both nostrils daily. 10/17/23   Hassie Lint, PA-C   gabapentin  (NEURONTIN ) 400 MG capsule TAKE 1 CAPSULE BY MOUTH IN AM AND NOON, AND 2 CAPSULE BY MOUTH AT BEDTIME 09/04/23   Cameron Cea, MD  hydrOXYzine  (ATARAX ) 25 MG tablet TAKE 1 TABLET(25 MG) BY MOUTH THREE TIMES DAILY 08/20/23   Newlin, Enobong, MD  ipratropium (ATROVENT ) 0.03 % nasal spray Place into the nose. 12/20/22   [provider]  linaclotide  (LINZESS ) 145 MCG CAPS capsule Take 1 capsule (145 mcg total) by mouth daily before breakfast. Patient taking differently: Take 145 mcg by mouth as needed. 05/19/22   Daina Drum, MD  methimazole  (TAPAZOLE ) 5 MG tablet Take 1 tablet (5 mg total) by mouth as directed. 1 tablet Monday through Thursday, skip rest of the week 10/02/23   Shamleffer, Ibtehal Jaralla, MD  metoprolol  succinate (TOPROL -XL) 25 MG 24 hr tablet Take 0.5 tablets (12.5 mg total) by mouth daily. For increased heart rate 08/20/23   Newlin, Enobong, MD  naloxone [employer]) nasal spray 4 mg/0.1 mL 1 (ONE) SPRAY AS NEEDED 07/13/23   [provider]  naproxen  (NAPROSYN ) 500 MG tablet TAKE 1 TABLET BY MOUTH TWICE A DAY AS NEEDED FOR PAIN 03/19/23   Joaquin Mulberry, MD  oxyCODONE -acetaminophen  (PERCOCET/ROXICET) 5-325 MG tablet Take 1 tablet by mouth 4 (four) times daily. 07/13/23   [provider]  potassium chloride  (KLOR-CON ) 10 MEQ tablet Take by mouth. 08/08/22   [provider]  rizatriptan  (MAXALT ) 10  MG tablet Take 1 tablet (10 mg total) by mouth as needed for migraine. May repeat in 2 hours if needed 08/20/23   Newlin, Enobong, MD  sucralfate  (CARAFATE ) 1 GM/10ML suspension Take 10 mLs (1 g total) by mouth 4 (four) times daily. Take 2 teaspoons with each meal and 2 teaspoons at bedtime. Do not take within 2 hours of any other medications 08/09/23   Tory Freiberg, NP  sucralfate  (CARAFATE ) 1 GM/10ML suspension Take 10 mLs (1 g total) by mouth 4 (four) times daily. 09/17/23 10/17/23  Daina Drum, MD  topiramate  (TOPAMAX ) 50 MG tablet Take 1  tablet (50 mg total) by mouth 2 (two) times daily. 08/20/23   Newlin, Enobong, MD  venlafaxine  XR (EFFEXOR  XR) 75 MG 24 hr capsule Take 1 capsule (75 mg total) by mouth daily with breakfast. 08/20/23   Joaquin Mulberry, MD  Vitamin D , Ergocalciferol , (DRISDOL) 1.25 MG (50000 UNIT) CAPS capsule Take 50,000 Units by mouth once a week. 07/19/23   [provider]  zolpidem  (AMBIEN ) 5 MG tablet Take 1 tablet (5 mg total) by mouth at bedtime as needed for sleep. 06/08/23   Cameron Cea, MD  medroxyPROGESTERone  (PROVERA ) 10 MG tablet Take 2 tablets by mouth daily Patient not taking: Reported on 06/05/2019 02/06/18 02/02/20  Jan Mcgill, MD      Allergies    Latex and Tramadol     Review of Systems   Review of Systems  All other systems reviewed and are negative.   Physical Exam Updated Vital Signs BP 118/78   Pulse 78   Temp 98.3 F (36.8 C)   Resp 15   Ht 5\' 7"  (1.702 m)   Wt 89.8 kg   SpO2 100%   BMI 31.01 kg/m  Physical Exam Vitals and nursing note reviewed.  Constitutional:      Appearance: She is well-developed.  HENT:     Head: Atraumatic.  Cardiovascular:     Rate and Rhythm: Normal rate.  Pulmonary:     Effort: Pulmonary effort is normal.  Musculoskeletal:        General: No tenderness or deformity.     Cervical back: Normal range of motion and neck supple.  Skin:    General: Skin is warm and dry.     Findings: No erythema.  Neurological:     Mental Status: She is alert and oriented to person, place, and time.     ED Results / Procedures / Treatments   Labs (all labs ordered are listed, but only abnormal results are displayed) Labs Reviewed  BASIC METABOLIC PANEL WITH GFR - Abnormal; Notable for the following components:      Result Value   Potassium 3.3 (*)    All other components within normal limits  CBC WITH DIFFERENTIAL/PLATELET    EKG None  Radiology No results found.  Procedures Procedures    Medications Ordered in ED Medications   naproxen  (NAPROSYN ) tablet 500 mg (has no administration in time range)    ED Course/ Medical Decision Making/ A&P                                 Medical Decision Making Amount and/or Complexity of Data Reviewed Labs: ordered.  Risk Prescription drug management.  43 year old female with previous history of breast cancer, left-sided port for chemotherapy, neuropathy comes in with chief complaint of left upper extremity pain and concerns for DVT.  Differential diagnosis  considered for this patient includes DVT, phantom pain/neuropathic pain, cellulitis.  I have reviewed patient's recent workup which includes CT angio chest, CT chest with contrast, CT soft tissue neck.  All of these were reassuring.  There was no blood clot.  She has no upper extremity swelling that is profound.  No facial swelling.  We do not think this is vena cava syndrome.  My overall suspicion for clot is low, however patient is concerned mainly for upper extremity DVT, which led her to coming to the ER.  Patient prefers getting ultrasound completed.  I have ordered outpatient ultrasound for tomorrow.  We have recommended outpatient follow-up with PCP for symptoms continue.  Additionally, she also mention to me that she is having left-sided earache.  Her ear exam is reassuring.  There is no erythema, exudate, fluid behind TM.   Final Clinical Impression(s) / ED Diagnoses Final diagnoses:  Pain of left upper extremity    Rx / DC Orders ED Discharge Orders          Ordered    Upper Ext Left Venous US        Comments: IMPORTANT PATIENT INSTRUCTIONS:  You have been scheduled for an Outpatient Ultrasound.    Your appointment has been scheduled for:  _______ am/pm on _______________ (date).  If your appointment is scheduled for a Saturday, Sunday or holiday, please go to the [employer] at [facility] Emergency Department Registration Desk at least 15 minutes prior to your appointment time and  tell them you are there for an ultrasound.    If your appointment is scheduled for a weekday (Monday - Friday), please go directly to the [facility] at [facility] Radiology Department reception area at least 15 minutes prior to your appointment time and tell them you are there for an ultrasound.  Please call [phone] with questions.   10/23/23 2319              Deatra Face, MD 10/23/23 0981

## 2023-10-23 NOTE — ED Notes (Signed)
 ED Provider at bedside.

## 2023-10-23 NOTE — Telephone Encounter (Signed)
-----   Message from Conway Dennis sent at 10/22/2023  3:36 PM EDT ----- Cloteal Isaacson--not sure if you want to call her back and tell her the results, but no cancer in her neck.  They see the thyroid  nodule and that is it.  IF she is still worried about cancer recurrence, she should prob come back and see VG when he has an opening, he may explain things better and be more reassuring at this juncture. ----- Message ----- From: Interface, Rad Results In Sent: 10/22/2023   3:20 PM EDT To: Percival Brace, NP

## 2023-10-23 NOTE — Discharge Instructions (Signed)
 Overall suspicion for blood clot in the left arm is low, however ultrasound DVT has been ordered.  Please follow instructions on getting the ultrasound completed as an outpatient tomorrow.  Follow-up with your primary care doctor for further assessment.

## 2023-10-24 ENCOUNTER — Ambulatory Visit (HOSPITAL_BASED_OUTPATIENT_CLINIC_OR_DEPARTMENT_OTHER)
Admission: RE | Admit: 2023-10-24 | Discharge: 2023-10-24 | Disposition: A | Discharge status: Home / Self Care | Source: Ambulatory Visit | Attending: Emergency Medicine | Admitting: Emergency Medicine

## 2023-10-24 ENCOUNTER — Other Ambulatory Visit: Payer: Self-pay

## 2023-10-24 ENCOUNTER — Other Ambulatory Visit (HOSPITAL_BASED_OUTPATIENT_CLINIC_OR_DEPARTMENT_OTHER): Payer: Self-pay | Admitting: Emergency Medicine

## 2023-10-24 ENCOUNTER — Encounter: Payer: Self-pay | Admitting: Adult Health

## 2023-10-24 ENCOUNTER — Ambulatory Visit (AMBULATORY_SURGERY_CENTER)

## 2023-10-24 VITALS — Ht 66.0 in | Wt 198.0 lb

## 2023-10-24 DIAGNOSIS — M79602 Pain in left arm: Secondary | ICD-10-CM

## 2023-10-24 DIAGNOSIS — K259 Gastric ulcer, unspecified as acute or chronic, without hemorrhage or perforation: Secondary | ICD-10-CM

## 2023-10-24 NOTE — Progress Notes (Signed)
 Denies allergies to eggs or soy products. Denies complication of anesthesia or sedation. Denies use of weight loss medication. Denies use of O2.   Emmi instructions given for colonoscopy.

## 2023-10-25 ENCOUNTER — Ambulatory Visit: Payer: Self-pay | Admitting: *Deleted

## 2023-10-25 ENCOUNTER — Other Ambulatory Visit: Payer: Self-pay | Admitting: Licensed Clinical Social Worker

## 2023-10-25 NOTE — Telephone Encounter (Signed)
 Chief Complaint: left shoulder pain, hand and leg pain  Symptoms: sharp, stabbing, aching pain left shoulder, hand, and left leg . See ED visit 10/23/23. Doppler study ordered and impression: no evidence of DVT left upper extremity.  Frequency: prior to 10/23/23 Pertinent Negatives: Patient denies chest pain no difficulty breathing  Disposition: [] ED /[] Urgent Care (no appt availability in office) / [] Appointment(In office/virtual)/ []  [place] Virtual [facility]/ [] Home Care/ [] Refused Recommended Disposition /[x] [place] [facility]/ []  Follow-up with PCP Additional Notes:   No available appt with PCP. Recommended mobile bus / [facility] if pain severe. Recommended to keep appt with PCP for 11/20/23 for hospital f/u. Called [facility] for assist with scheduling and recommended to keep f/u appt in June to offer mobile clinic for today .       Called patient back for assist with hospital f/u appt and left shoulder pain  Chief Complaint: left shoulder, hand, leg pain hx breast CA  Symptoms: sharp, stabbing, aching pain left shoulder, hand, leg. Can walk.  Frequency: 10/23/23 s/p hospital discharge Pertinent Negatives: Patient denies chest pain no difficulty breathing no swelling in left arm or leg.  Disposition: [] ED /[] Urgent Care (no appt availability in office) / [] Appointment(In office/virtual)/ []  [place] Virtual [facility]/ [] Home Care/ [] Refused Recommended Disposition /[x] [place] [facility]/ []  Follow-up with PCP Additional Notes:   See previous note above, recommended mobile unit for today .       Copied from CRM [id number]. Topic: Clinical - Red Word Triage >> Oct 25, 2023 11:21 AM Heather  M wrote: Red Word that prompted transfer to Nurse Triage: Value based care nurse calling to schedule hospital f/u for patient. Stated patient has severe pain. Patient was seen in ER, seen for pain from shoulder to her leg and hand, aching and sharp pain on her left side. Rates pain 7/10. Please call patient  directly at [phone]. Reason for Disposition  [1] Shoulder pains with exertion (e.g., walking) AND [2] pain goes away on resting AND [3] not present now    Pain is present now  Answer Assessment - Initial Assessment Questions 1. ONSET: "When did the pain start?"     On going has been seen in ED 10/23/23 2. LOCATION: "Where is the pain located?"     Left shoulder, hand and leg 3. PAIN: "How bad is the pain?" (Scale 1-10; or mild, moderate, severe)   - MILD (1-3): doesn't interfere with normal activities   - MODERATE (4-7): interferes with normal activities (e.g., work or school) or awakens from sleep   - SEVERE (8-10): excruciating pain, unable to do any normal activities, unable to move arm at all due to pain    Difficulty "doing things". See in ED pain level 7/10 4. WORK OR EXERCISE: "Has there been any recent work or exercise that involved this part of the body?"     na 5. CAUSE: "What do you think is causing the shoulder pain?"     Na 6. OTHER SYMPTOMS: "Do you have any other symptoms?" (e.g., neck pain, swelling, rash, fever, numbness, weakness)     Sharp, stabbing, aching pain left shoulder, hand and leg can walk  7. PREGNANCY: "Is there any chance you are pregnant?" "When was your last menstrual period?"     na  Protocols used: Shoulder Pain-A-AH  Answer Assessment - Initial Assessment Questions 1. ONSET: "When did the pain start?"     On going has been seen in ED 2. LOCATION: "Where is the pain located?"  Left shoulder, hand and leg 3. PAIN: "How bad is the pain?" (Scale 1-10; or mild, moderate, severe)   - MILD (1-3): doesn't interfere with normal activities   - MODERATE (4-7): interferes with normal activities (e.g., work or school) or awakens from sleep   - SEVERE (8-10): excruciating pain, unable to do any normal activities, unable to move arm at all due to pain    Difficulty "doing things". See in ED 4. WORK OR EXERCISE: "Has there been any recent work or  exercise that involved this part of the body?"     na 5. CAUSE: "What do you think is causing the shoulder pain?"     Na 6. OTHER SYMPTOMS: "Do you have any other symptoms?" (e.g., neck pain, swelling, rash, fever, numbness, weakness)     Sharp, stabbing, aching pain left shoulder, hand and leg can walk  7. PREGNANCY: "Is there any chance you are pregnant?" "When was your last menstrual period?"     na  Protocols used: Shoulder Pain-A-AH  Reason for Disposition  [1] Shoulder pains with exertion (e.g., walking) AND [2] pain goes away on resting AND [3] not present now  Answer Assessment - Initial Assessment Questions 1. ONSET: "When did the pain start?"     On going has been seen in ED 2. LOCATION: "Where is the pain located?"     Left shoulder, hand and leg 3. PAIN: "How bad is the pain?" (Scale 1-10; or mild, moderate, severe)   - MILD (1-3): doesn't interfere with normal activities   - MODERATE (4-7): interferes with normal activities (e.g., work or school) or awakens from sleep   - SEVERE (8-10): excruciating pain, unable to do any normal activities, unable to move arm at all due to pain    Difficulty "doing things". See in ED 4. WORK OR EXERCISE: "Has there been any recent work or exercise that involved this part of the body?"     na 5. CAUSE: "What do you think is causing the shoulder pain?"     Na 6. OTHER SYMPTOMS: "Do you have any other symptoms?" (e.g., neck pain, swelling, rash, fever, numbness, weakness)     Sharp, stabbing, aching pain left shoulder, hand and leg can walk  7. PREGNANCY: "Is there any chance you are pregnant?" "When was your last menstrual period?"     na  Protocols used: Shoulder Pain-A-AH

## 2023-10-25 NOTE — Telephone Encounter (Signed)
 Patient was called and a VM was left informing patient to return call.

## 2023-10-26 ENCOUNTER — Other Ambulatory Visit (HOSPITAL_COMMUNITY): Payer: Self-pay

## 2023-11-02 NOTE — Patient Instructions (Signed)
 Visit Information  Thank you for taking time to visit with me today. Please don't hesitate to contact me if I can be of assistance to you before our next scheduled appointment.  Our next appointment is by telephone on 06/12 at 11 AM Please call the care guide team at [phone] if you need to cancel or reschedule your appointment.   Following is a copy of your care plan:   Goals Addressed             This Visit's Progress    LCSW VBCI Social Work Care Plan   On track    Problems:   Disease Management support and education needs related to Anxiety with Panic Symptoms,  CSW Clinical Goal(s):   Over the next 90 days the Patient will attend all scheduled medical appointments as evidenced by patient report and care team review of appointment completion in electronic MEDICAL RECORD NUMBER  demonstrate a reduction in symptoms related to Anxiety with Panic Symptoms, .  Interventions:  Mental Health:  Evaluation of current treatment plan related to Anxiety with Panic Symptoms, Active listening / Reflection utilized Emotional Support Provided Reviewed mental health medications and discussed importance of compliance:   Solution-Focued Strategies employed: LCSW collaborated with [employer], Heather , regarding pt's pain symptoms. She will have a Triage nurse call pt to further assess  Patient Goals/Self-Care Activities:  Increase coping skills and healthy habits  Follow up with PCP about pain symptoms  Plan:   Telephone follow up appointment with care management team member scheduled for:  2-4 weeks        Please call the Suicide and Crisis Lifeline: 988 go to [facility] Urgent Care [address] [place] [phone]) call 911 if you are experiencing a Mental Health or Behavioral Health Crisis or need someone to talk to.  Patient verbalizes understanding of instructions and care plan provided today and agrees to view in MyChart. Active MyChart  status and patient understanding of how to access instructions and care plan via MyChart confirmed with patient.     Arlis Bent [facility]  [facility], [facility] Clinical Social Worker Direct Dial: [phone]  Fax: [phone] Website: [email or web address] 12:18 PM

## 2023-11-02 NOTE — Patient Outreach (Signed)
 Complex Care Management   Visit Note  10/25/2023  Name:  Laurie Bell MRN: [id number] DOB: 08/16/1980  Situation: Referral received for Complex Care Management related to Mental/Behavioral Health diagnosis MDD/GAD I obtained verbal consent from Patient.  Visit completed with pt  on the phone  Background:   Past Medical History:  Diagnosis Date   Allergy    Anxiety    Arthritis    Breast cancer (HCC)    Chlamydia contact, treated    GERD (gastroesophageal reflux disease)    Hyperthyroidism     Assessment: Patient Reported Symptoms:  Cognitive Cognitive Status: Alert and oriented to person, place, and time, Normal speech and language skills Cognitive/Intellectual Conditions Management [RPT]: None reported or documented in medical history or problem list      Neurological Neurological Review of Symptoms: Not assessed    HEENT HEENT Symptoms Reported: Not assessed      Cardiovascular Cardiovascular Symptoms Reported: Not assessed    Respiratory Respiratory Symptoms Reported: Not assesed    Endocrine Patient reports the following symptoms related to hypoglycemia or hyperglycemia : Not assessed    Gastrointestinal Gastrointestinal Symptoms Reported: Not assessed      Genitourinary Genitourinary Symptoms Reported: Not assessed    Integumentary Integumentary Symptoms Reported: Not assessed    Musculoskeletal Musculoskelatal Symptoms Reviewed: Not assessed        Psychosocial Psychosocial Symptoms Reported: Not assessed            10/17/2023    3:43 PM  Depression screen PHQ 2/9  Decreased Interest 1  Down, Depressed, Hopeless 0  PHQ - 2 Score 1  Altered sleeping 2  Tired, decreased energy 2  Change in appetite 0  Feeling bad or failure about yourself  0  Trouble concentrating 1  Moving slowly or fidgety/restless 0  Suicidal thoughts 0  PHQ-9 Score 6  Difficult doing work/chores Somewhat difficult    There were no vitals filed for this  visit.  Medications Reviewed Today     Reviewed by Adriana Albany, LCSW (Social Worker) on 11/02/23 at 1209  Med List Status: <None>   Medication Order Taking? Sig Documenting Provider Last Dose Status Informant  0.9 %  sodium chloride  infusion [id number]   Dorsey, Ying C, MD  Active   acetaminophen  (TYLENOL ) 500 MG tablet [id number] No Take 1,000 mg by mouth every 6 (six) hours as needed for moderate pain. [provider] Taking Active Self, Pharmacy Records  albuterol  (VENTOLIN  HFA) 108 [phone] Base) MCG/ACT inhaler [id number] No TAKE 2 PUFFS BY MOUTH EVERY 6 HOURS AS NEEDED FOR WHEEZE OR SHORTNESS OF BREATH  Patient taking differently: Inhale 2 puffs into the lungs every 6 (six) hours as needed for wheezing or shortness of breath.   Newlin, Enobong, MD Taking Active Self, Pharmacy Records  cyclobenzaprine  (FLEXERIL ) 10 MG tablet [id number] No Take 1 tablet (10 mg total) by mouth 2 (two) times daily as needed for muscle spasms. TAKE 1 TABLET BY MOUTH AT BEDTIME AS NEEDED FOR MUSCLE SPASMS Joaquin Mulberry, MD Taking Active   esomeprazole  (NEXIUM ) 40 MG capsule [id number] No Take 1 capsule (40 mg total) by mouth 2 (two) times daily before a meal. Daina Drum, MD Taking Active   famotidine  (PEPCID ) 40 MG tablet [id number] No Take 1 tablet (40 mg total) by mouth at bedtime. Suzanna Erp M, PA-C Taking Active   fluticasone  (FLONASE ) 50 MCG/ACT nasal spray [id number] No Place 2 sprays into both nostrils daily. Hassie Lint, PA-C Taking Active  gabapentin  (NEURONTIN ) 400 MG capsule [id number] No TAKE 1 CAPSULE BY MOUTH IN AM AND NOON, AND 2 CAPSULE BY MOUTH AT BEDTIME Gudena, Vinay, MD Taking Active   hydrOXYzine  (ATARAX ) 25 MG tablet [id number] No TAKE 1 TABLET(25 MG) BY MOUTH THREE TIMES DAILY Newlin, Enobong, MD Taking Active   ipratropium (ATROVENT ) 0.03 % nasal spray [id number] No Place into the nose.  Patient not taking: Reported on 10/24/2023   [provider] Not Taking  Active   linaclotide  (LINZESS ) 145 MCG CAPS capsule [id number] No Take 1 capsule (145 mcg total) by mouth daily before breakfast.  Patient taking differently: Take 145 mcg by mouth as needed.   Daina Drum, MD Taking Active Self, Pharmacy Records  Patient not taking:  Discontinued 02/02/20 1436 methimazole  (TAPAZOLE ) 5 MG tablet [id number] No Take 1 tablet (5 mg total) by mouth as directed. 1 tablet Monday through Thursday, skip rest of the week Shamleffer, Ibtehal Jaralla, MD Taking Active   metoprolol  succinate (TOPROL -XL) 25 MG 24 hr tablet [id number] No Take 0.5 tablets (12.5 mg total) by mouth daily. For increased heart rate Joaquin Mulberry, MD Taking Active   naloxone [facility]) nasal spray 4 mg/0.1 mL [id number] No 1 (ONE) SPRAY AS NEEDED [provider] Taking Active   naproxen  (NAPROSYN ) 500 MG tablet [id number] No TAKE 1 TABLET BY MOUTH TWICE A DAY AS NEEDED FOR PAIN  Patient not taking: Reported on 10/24/2023   Newlin, Enobong, MD Not Taking Active Self, Pharmacy Records  oxyCODONE -acetaminophen  (PERCOCET/ROXICET) 5-325 MG tablet [id number] No Take 1 tablet by mouth 4 (four) times daily. [provider] Taking Active Self, Pharmacy Records  potassium chloride  (KLOR-CON ) 10 MEQ tablet [id number] No Take by mouth. [provider] Taking Active   rizatriptan  (MAXALT ) 10 MG tablet [id number] No Take 1 tablet (10 mg total) by mouth as needed for migraine. May repeat in 2 hours if needed Newlin, Enobong, MD Taking Active   sucralfate  (CARAFATE ) 1 GM/10ML suspension [id number] No Take 10 mLs (1 g total) by mouth 4 (four) times daily. Take 2 teaspoons with each meal and 2 teaspoons at bedtime. Do not take within 2 hours of any other medications Tory Freiberg, NP Taking Active   sucralfate  (CARAFATE ) 1 GM/10ML suspension [id number] No Take 10 mLs (1 g total) by mouth 4 (four) times daily. Daina Drum, MD Taking Expired 10/17/23 2359   topiramate  (TOPAMAX ) 50 MG tablet  [id number] No Take 1 tablet (50 mg total) by mouth 2 (two) times daily. Newlin, Enobong, MD Taking Active   venlafaxine  XR (EFFEXOR  XR) 75 MG 24 hr capsule [id number] No Take 1 capsule (75 mg total) by mouth daily with breakfast.  Patient not taking: Reported on 10/24/2023   Newlin, Enobong, MD Not Taking Active   Vitamin D , Ergocalciferol , (DRISDOL) 1.25 MG (50000 UNIT) CAPS capsule [id number] No Take 50,000 Units by mouth once a week. [provider] Taking Active Self, Pharmacy Records  zolpidem  (AMBIEN ) 5 MG tablet [id number] No Take 1 tablet (5 mg total) by mouth at bedtime as needed for sleep. Gudena, Vinay, MD Taking Active Self, Pharmacy Records            Recommendation:   PCP Follow-up  Follow Up Plan:   Telephone follow-up 2-4 weeks  Alease Hunter, LCSW [facility]  [facility], [facility] Clinical Social Worker Direct Dial: [phone]  Fax: [phone] Website: [email or web address] 12:18 PM

## 2023-11-19 ENCOUNTER — Ambulatory Visit (AMBULATORY_SURGERY_CENTER): Admitting: Internal Medicine

## 2023-11-19 ENCOUNTER — Encounter: Payer: Self-pay | Admitting: Internal Medicine

## 2023-11-19 VITALS — BP 118/87 | HR 59 | Temp 98.2°F | Resp 15 | Ht 66.0 in | Wt 198.0 lb

## 2023-11-19 DIAGNOSIS — K259 Gastric ulcer, unspecified as acute or chronic, without hemorrhage or perforation: Secondary | ICD-10-CM

## 2023-11-19 DIAGNOSIS — K225 Diverticulum of esophagus, acquired: Secondary | ICD-10-CM

## 2023-11-19 DIAGNOSIS — K3189 Other diseases of stomach and duodenum: Secondary | ICD-10-CM | POA: Diagnosis not present

## 2023-11-19 MED ORDER — AMBULATORY NON FORMULARY MEDICATION: 1 refills | Status: AC

## 2023-11-19 MED ORDER — SODIUM CHLORIDE 0.9 % IV SOLN: 500.0000 mL | Freq: Once | INTRAVENOUS | Status: DC

## 2023-11-19 NOTE — Op Note (Addendum)
 [name] Procedure Date: 11/19/2023 11:24 AM MRN: [id number] Endoscopist: Freada Jacobs [place] , , [id number] Age: 43 Referring MD:  Date of Birth: 29-Jun-1980 Gender: Female Account #: [id number] Procedure:                Upper GI endoscopy Indications:              Heartburn, Follow-up of acute gastric ulcer Medicines:                Monitored Anesthesia Care Procedure:                Pre-Anesthesia Assessment:                           - Prior to the procedure, a History and Physical                            was performed, and patient medications and                            allergies were reviewed. The patient's tolerance of                            previous anesthesia was also reviewed. The risks                            and benefits of the procedure and the sedation                            options and risks were discussed with the patient.                            All questions were answered, and informed consent                            was obtained. Prior Anticoagulants: The patient has                            taken no anticoagulant or antiplatelet agents. ASA                            Grade Assessment: II - A patient with mild systemic                            disease. After reviewing the risks and benefits,                            the patient was deemed in satisfactory condition to                            undergo the procedure.                           After obtaining informed consent, the endoscope was  passed under direct vision. Throughout the                            procedure, the patient's blood pressure, pulse, and                            oxygen saturations were monitored continuously. The                            GIF [id number] #[id number] was introduced through the                            mouth, and advanced to the second part of duodenum.                            The  upper GI endoscopy was accomplished without                            difficulty. The patient tolerated the procedure                            well. Scope In: Scope Out: Findings:                 A non-bleeding diverticulum with a small opening                            and no stigmata of recent bleeding was found at the                            cricopharyngeus.                           Localized mildly erythematous mucosa without                            bleeding was found in the gastric antrum.                           The examined duodenum was normal. Complications:            No immediate complications. Estimated Blood Loss:     Estimated blood loss: none. 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                           - Erythematous mucosa in the antrum.                           - Normal examined duodenum.                           - No specimens collected. Recommendation:           - Discharge patient to home (with escort).                           -  Your prior stomach ulcers have healed.                           - Recommend trying Reflux Gourmet                           - Continue PPI BID for now. Can back off in the                            future if this is not helpful                           - Will refill GI cocktail to be sent to [facility]. Okay to stop sucralfate  since this has                            not been as helpful.                           - Return to GI clinic in 2-3 months.                           - The findings and recommendations were discussed                            with the patient. Dr Pedro Bourgeois "[name]" [name],  11/19/2023 11:57:58 AM

## 2023-11-19 NOTE — Progress Notes (Signed)
 Pt's states no medical or surgical changes since previsit or office visit.

## 2023-11-19 NOTE — Patient Instructions (Addendum)
 GI Cocktail: 270 mL viscous Xylocaine  2%, 270 mL Dicyclomine 10 mg/5 mL, 810 mL Mylanta/Maylox. Take 8-10 mL every 4-6 hours as needed for severe pain  Your prior stomach ulcers have healed. Recommend trying Reflux Gourmet Continue PPI BID for now. Can back off in the future if this is not helpful Will refill GI cocktail to be sent to [facility]. Okay to stop sucralfate  since this has not been as helpful. Return to GI clinic in 2-3 months. The findings and recommendations were discussed with the patient  YOU HAD AN ENDOSCOPIC PROCEDURE TODAY AT THE [facility]:   Refer to the procedure report that was given to you for any specific questions about what was found during the examination.  If the procedure report does not answer your questions, please call your gastroenterologist to clarify.  If you requested that your care partner not be given the details of your procedure findings, then the procedure report has been included in a sealed envelope for you to review at your convenience later.  YOU SHOULD EXPECT: Some feelings of bloating in the abdomen. Passage of more gas than usual.  Walking can help get rid of the air that was put into your GI tract during the procedure and reduce the bloating. If you had a lower endoscopy (such as a colonoscopy or flexible sigmoidoscopy) you may notice spotting of blood in your stool or on the toilet paper. If you underwent a bowel prep for your procedure, you may not have a normal bowel movement for a few days.  Please Note:  You might notice some irritation and congestion in your nose or some drainage.  This is from the oxygen used during your procedure.  There is no need for concern and it should clear up in a day or so.  SYMPTOMS TO REPORT IMMEDIATELY:  Following upper endoscopy (EGD)  Vomiting of blood or coffee ground material  New chest pain or pain under the shoulder blades  Painful or persistently difficult swallowing  New shortness  of breath  Fever of 100F or higher  Black, tarry-looking stools  For urgent or emergent issues, a gastroenterologist can be reached at any hour by calling [phone]. Do not use MyChart messaging for urgent concerns.    DIET:  We do recommend a small meal at first, but then you may proceed to your regular diet.  Drink plenty of fluids but you should avoid alcoholic beverages for 24 hours.  ACTIVITY:  You should plan to take it easy for the rest of today and you should NOT DRIVE or use heavy machinery until tomorrow (because of the sedation medicines used during the test).    FOLLOW UP: Our staff will call the number listed on your records the next business day following your procedure.  We will call around 7:15- 8:00 am to check on you and address any questions or concerns that you may have regarding the information given to you following your procedure. If we do not reach you, we will leave a message.     If any biopsies were taken you will be contacted by phone or by letter within the next 1-3 weeks.  Please call us  at [phone] if you have not heard about the biopsies in 3 weeks.    SIGNATURES/CONFIDENTIALITY: You and/or your care partner have signed paperwork which will be entered into your electronic medical record.  These signatures attest to the fact that that the information above on your After  Visit Summary has been reviewed and is understood.  Full responsibility of the confidentiality of this discharge information lies with you and/or your care-partner.

## 2023-11-19 NOTE — Progress Notes (Signed)
 GASTROENTEROLOGY PROCEDURE H&P NOTE   Primary Care Physician: Joaquin Mulberry, MD    Reason for Procedure:   History of gastric ulcers  Plan:    EGD  Patient is appropriate for endoscopic procedure(s) in the ambulatory ([facility]) setting.  The nature of the procedure, as well as the risks, benefits, and alternatives were carefully and thoroughly reviewed with the patient. Ample time for discussion and questions allowed. The patient understood, was satisfied, and agreed to proceed.     HPI: Laurie Bell is a 43 y.o. female who presents for EGD for history of gastric ulcers.  EGD 09/17/23:  - A non- bleeding diverticulum with a small opening and no stigmata of recent bleeding was found at the cricopharyngeus. - White nummular lesions were noted in the proximal esophagus. Biopsies were taken with a cold forceps for histology. - Three non- bleeding cratered gastric ulcers with a clean ulcer base ( Forrest Class III) were found in the gastric body. The largest lesion was 10 mm in largest dimension. Biopsies were taken with a cold forceps for histology. - Localized inflammation characterized by congestion ( edema) , erythema and granularity was found in the gastric body and in the gastric antrum. Biopsies were taken with a cold forceps for histology. - The examined duodenum was normal. Path: 1. 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       -  OXYNTIC MUCOSA WITH NO SIGNIFICANT PATHOLOGY.       -  NO HELICOBACTER PYLORI ORGANISMS IDENTIFIED ON H&E STAINED SLIDE.       2. Surgical [P], esophageal :       -  SQUAMOUS MUCOSA WITH NO SIGNIFICANT PATHOLOGY.   Past Medical History:  Diagnosis Date   Allergy    Anxiety    Arthritis    Breast cancer (HCC)    Chlamydia contact, treated    GERD (gastroesophageal reflux disease)    Hyperthyroidism     Past Surgical History:  Procedure  Laterality Date   AXILLARY SENTINEL NODE BIOPSY Right 12/01/2022   Procedure: RIGHT AXILLARY SENTINEL NODE BIOPSY;  Surgeon: Oza Blumenthal, MD;  Location: [facility] OR;  Service: General;  Laterality: Right;   BREAST BIOPSY Right 10/02/2022   MM RT BREAST BX W LOC DEV 1ST LESION IMAGE BX SPEC STEREO GUIDE 10/02/2022 [facility]   BREAST BIOPSY  11/10/2022   MM RT RADIOACTIVE SEED LOC MAMMO GUIDE 11/10/2022 [facility]   BREAST LUMPECTOMY WITH RADIOACTIVE SEED LOCALIZATION Right 11/13/2022   Procedure: RIGHT BREAST LUMPECTOMY WITH RADIOACTIVE SEED LOCALIZATION;  Surgeon: Oza Blumenthal, MD;  Location: [facility];  Service: General;  Laterality: Right;   ENDOMETRIAL ABLATION N/A 04/25/2017   Procedure: ENDOMETRIAL ABLATION With NOVASURE;  Surgeon: Tresia Fruit, MD;  Location: [facility];  Service: Gynecology;  Laterality: N/A;   ESOPHAGEAL MANOMETRY N/A 07/27/2021   Procedure: ESOPHAGEAL MANOMETRY (EM);  Surgeon: Daina Drum, MD;  Location: [facility];  Service: Gastroenterology;  Laterality: N/A;   PORTACATH PLACEMENT N/A 12/01/2022   Procedure: INSERTION PORT-A-CATH WITH ULTRASOUND GUIDANCE;  Surgeon: Oza Blumenthal, MD;  Location: [facility] OR;  Service: General;  Laterality: N/A;   RE-EXCISION OF BREAST CANCER,SUPERIOR MARGINS Right 12/01/2022   Procedure: RE-EXCISION OF RIGHT BREAST CANCER;  Surgeon: Oza Blumenthal, MD;  Location: [facility] OR;  Service: General;  Laterality: Right;   TUBAL LIGATION  2003  Prior to Admission medications   Medication Sig Start Date End Date Taking? Authorizing Provider  acetaminophen  (TYLENOL ) 500 MG tablet Take 1,000 mg by mouth every 6 (six) hours as needed for moderate pain.    [provider]  albuterol  (VENTOLIN  HFA) 108 (90 Base) MCG/ACT inhaler TAKE 2 PUFFS BY MOUTH EVERY 6 HOURS AS NEEDED FOR WHEEZE OR SHORTNESS OF BREATH Patient taking differently: Inhale 2 puffs into the lungs every 6 (six)  hours as needed for wheezing or shortness of breath. 12/22/22   Newlin, Enobong, MD  cyclobenzaprine  (FLEXERIL ) 10 MG tablet Take 1 tablet (10 mg total) by mouth 2 (two) times daily as needed for muscle spasms. TAKE 1 TABLET BY MOUTH AT BEDTIME AS NEEDED FOR MUSCLE SPASMS 08/20/23   Newlin, Enobong, MD  esomeprazole  (NEXIUM ) 40 MG capsule Take 1 capsule (40 mg total) by mouth 2 (two) times daily before a meal. 07/04/23   Daina Drum, MD  famotidine  (PEPCID ) 40 MG tablet Take 1 tablet (40 mg total) by mouth at bedtime. 08/31/23   McMichael, Elba Greathouse, PA-C  fluticasone  (FLONASE ) 50 MCG/ACT nasal spray Place 2 sprays into both nostrils daily. 10/17/23   Hassie Lint, PA-C  gabapentin  (NEURONTIN ) 400 MG capsule TAKE 1 CAPSULE BY MOUTH IN AM AND NOON, AND 2 CAPSULE BY MOUTH AT BEDTIME 09/04/23   Cameron Cea, MD  hydrOXYzine  (ATARAX ) 25 MG tablet TAKE 1 TABLET(25 MG) BY MOUTH THREE TIMES DAILY 08/20/23   Newlin, Enobong, MD  ipratropium (ATROVENT ) 0.03 % nasal spray Place into the nose. Patient not taking: Reported on 10/24/2023 12/20/22   [provider]  linaclotide  (LINZESS ) 145 MCG CAPS capsule Take 1 capsule (145 mcg total) by mouth daily before breakfast. Patient taking differently: Take 145 mcg by mouth as needed. 05/19/22   Daina Drum, MD  methimazole  (TAPAZOLE ) 5 MG tablet Take 1 tablet (5 mg total) by mouth as directed. 1 tablet Monday through Thursday, skip rest of the week 10/02/23   Shamleffer, Ibtehal Jaralla, MD  metoprolol  succinate (TOPROL -XL) 25 MG 24 hr tablet Take 0.5 tablets (12.5 mg total) by mouth daily. For increased heart rate 08/20/23   Newlin, Enobong, MD  naloxone [facility]) nasal spray 4 mg/0.1 mL 1 (ONE) SPRAY AS NEEDED 07/13/23   [provider]  naproxen  (NAPROSYN ) 500 MG tablet TAKE 1 TABLET BY MOUTH TWICE A DAY AS NEEDED FOR PAIN Patient not taking: Reported on 10/24/2023 03/19/23   Newlin, Enobong, MD  oxyCODONE -acetaminophen  (PERCOCET/ROXICET) 5-325 MG  tablet Take 1 tablet by mouth 4 (four) times daily. 07/13/23   [provider]  potassium chloride  (KLOR-CON ) 10 MEQ tablet Take by mouth. 08/08/22   [provider]  rizatriptan  (MAXALT ) 10 MG tablet Take 1 tablet (10 mg total) by mouth as needed for migraine. May repeat in 2 hours if needed 08/20/23   Newlin, Enobong, MD  sucralfate  (CARAFATE ) 1 GM/10ML suspension Take 10 mLs (1 g total) by mouth 4 (four) times daily. Take 2 teaspoons with each meal and 2 teaspoons at bedtime. Do not take within 2 hours of any other medications 08/09/23   Tory Freiberg, NP  sucralfate  (CARAFATE ) 1 GM/10ML suspension Take 10 mLs (1 g total) by mouth 4 (four) times daily. 09/17/23 10/17/23  Daina Drum, MD  topiramate  (TOPAMAX ) 50 MG tablet Take 1 tablet (50 mg total) by mouth 2 (two) times daily. 08/20/23   Newlin, Enobong, MD  venlafaxine  XR (EFFEXOR  XR) 75 MG 24 hr capsule Take 1  capsule (75 mg total) by mouth daily with breakfast. Patient not taking: Reported on 10/24/2023 08/20/23   Newlin, Enobong, MD  Vitamin D , Ergocalciferol , (DRISDOL) 1.25 MG (50000 UNIT) CAPS capsule Take 50,000 Units by mouth once a week. 07/19/23   [provider]  zolpidem  (AMBIEN ) 5 MG tablet Take 1 tablet (5 mg total) by mouth at bedtime as needed for sleep. 06/08/23   Gudena, Vinay, MD  medroxyPROGESTERone  (PROVERA ) 10 MG tablet Take 2 tablets by mouth daily Patient not taking: Reported on 06/05/2019 02/06/18 02/02/20  Jan Mcgill, MD    Current Outpatient Medications  Medication Sig Dispense Refill   acetaminophen  (TYLENOL ) 500 MG tablet Take 1,000 mg by mouth every 6 (six) hours as needed for moderate pain.     albuterol  (VENTOLIN  HFA) 108 (90 Base) MCG/ACT inhaler TAKE 2 PUFFS BY MOUTH EVERY 6 HOURS AS NEEDED FOR WHEEZE OR SHORTNESS OF BREATH (Patient taking differently: Inhale 2 puffs into the lungs every 6 (six) hours as needed for wheezing or shortness of breath.) 54 each 0   cyclobenzaprine   (FLEXERIL ) 10 MG tablet Take 1 tablet (10 mg total) by mouth 2 (two) times daily as needed for muscle spasms. TAKE 1 TABLET BY MOUTH AT BEDTIME AS NEEDED FOR MUSCLE SPASMS 180 tablet 1   esomeprazole  (NEXIUM ) 40 MG capsule Take 1 capsule (40 mg total) by mouth 2 (two) times daily before a meal. 60 capsule 4   famotidine  (PEPCID ) 40 MG tablet Take 1 tablet (40 mg total) by mouth at bedtime. 30 tablet 2   fluticasone  (FLONASE ) 50 MCG/ACT nasal spray Place 2 sprays into both nostrils daily. 16 g 6   gabapentin  (NEURONTIN ) 400 MG capsule TAKE 1 CAPSULE BY MOUTH IN AM AND NOON, AND 2 CAPSULE BY MOUTH AT BEDTIME 56 capsule 0   hydrOXYzine  (ATARAX ) 25 MG tablet TAKE 1 TABLET(25 MG) BY MOUTH THREE TIMES DAILY 270 tablet 1   ipratropium (ATROVENT ) 0.03 % nasal spray Place into the nose. (Patient not taking: Reported on 10/24/2023)     linaclotide  (LINZESS ) 145 MCG CAPS capsule Take 1 capsule (145 mcg total) by mouth daily before breakfast. (Patient taking differently: Take 145 mcg by mouth as needed.) 30 capsule 5   methimazole  (TAPAZOLE ) 5 MG tablet Take 1 tablet (5 mg total) by mouth as directed. 1 tablet Monday through Thursday, skip rest of the week 52 tablet 3   metoprolol  succinate (TOPROL -XL) 25 MG 24 hr tablet Take 0.5 tablets (12.5 mg total) by mouth daily. For increased heart rate 45 tablet 1   naloxone (NARCAN) nasal spray 4 mg/0.1 mL 1 (ONE) SPRAY AS NEEDED     naproxen  (NAPROSYN ) 500 MG tablet TAKE 1 TABLET BY MOUTH TWICE A DAY AS NEEDED FOR PAIN (Patient not taking: Reported on 10/24/2023) 60 tablet 1   oxyCODONE -acetaminophen  (PERCOCET/ROXICET) 5-325 MG tablet Take 1 tablet by mouth 4 (four) times daily.     potassium chloride  (KLOR-CON ) 10 MEQ tablet Take by mouth.     rizatriptan  (MAXALT ) 10 MG tablet Take 1 tablet (10 mg total) by mouth as needed for migraine. May repeat in 2 hours if needed 10 tablet 2   sucralfate  (CARAFATE ) 1 GM/10ML suspension Take 10 mLs (1 g total) by mouth 4 (four) times  daily. Take 2 teaspoons with each meal and 2 teaspoons at bedtime. Do not take within 2 hours of any other medications 400 mL 0   sucralfate  (CARAFATE ) 1 GM/10ML suspension Take 10 mLs (1 g total) by mouth  4 (four) times daily. 1200 mL 0   topiramate  (TOPAMAX ) 50 MG tablet Take 1 tablet (50 mg total) by mouth 2 (two) times daily. 180 tablet 1   venlafaxine  XR (EFFEXOR  XR) 75 MG 24 hr capsule Take 1 capsule (75 mg total) by mouth daily with breakfast. (Patient not taking: Reported on 10/24/2023) 90 capsule 1   Vitamin D , Ergocalciferol , (DRISDOL) 1.25 MG (50000 UNIT) CAPS capsule Take 50,000 Units by mouth once a week.     zolpidem  (AMBIEN ) 5 MG tablet Take 1 tablet (5 mg total) by mouth at bedtime as needed for sleep. 30 tablet 3   Current Facility-Administered Medications  Medication Dose Route Frequency Provider Last Rate Last Admin   0.9 %  sodium chloride  infusion  500 mL Intravenous Once Daina Drum, MD       0.9 %  sodium chloride  infusion  500 mL Intravenous Once Daina Drum, MD        Allergies as of 11/19/2023 - Review Complete 11/19/2023  Allergen Reaction Noted   Latex Other (See Comments) 04/18/2017   Tramadol  Other (See Comments) 01/22/2023    Family History  Problem Relation Age of Onset   Hypertension Mother    Hypertension Maternal Aunt    Breast cancer Maternal Grandmother        dx > 50   Stomach cancer Maternal Grandfather        dx > 50   Breast cancer Paternal Grandmother        dx > 50   Esophageal cancer Neg Hx    Rectal cancer Neg Hx    Colon cancer Neg Hx     Social History   Socioeconomic History   Marital status: Single    Spouse name: Not on file   Number of children: 2   Years of education: Not on file   Highest education level: Not on file  Occupational History   Occupation: Event organiser: [employer]  Tobacco Use   Smoking status: Never   Smokeless tobacco: Never  Vaping Use   Vaping status: Never Used  Substance and Sexual  Activity   Alcohol use: No   Drug use: Not Currently    Types: Marijuana    Comment: quit 2018   Sexual activity: Not Currently    Birth control/protection: Surgical    Comment: B.T. L  Other Topics Concern   Not on file  Social History Narrative   ** Merged History Encounter **       Right Handed  Lives in a one story home    Social Drivers of Health   Financial Resource Strain: Medium Risk (11/03/2021)   Overall Financial Resource Strain (CARDIA)    Difficulty of Paying Living Expenses: Somewhat hard  Food Insecurity: No Food Insecurity (09/27/2023)   Hunger Vital Sign    Worried About Running Out of Food in the Last Year: Never true    Ran Out of Food in the Last Year: Never true  Transportation Needs: No Transportation Needs (09/27/2023)   PRAPARE - Administrator, Civil Service (Medical): No    Lack of Transportation (Non-Medical): No  Physical Activity: Sufficiently Active (11/03/2021)   Exercise Vital Sign    Days of Exercise per Week: 3 days    Minutes of Exercise per Session: 50 min  Stress: Stress Concern Present (11/03/2021)   [employer] of Occupational Health - Occupational Stress Questionnaire    Feeling of Stress : Very much  Social Connections: Moderately Isolated (11/03/2021)   Social Connection and Isolation Panel [NHANES]    Frequency of Communication with Friends and Family: Once a week    Frequency of Social Gatherings with Friends and Family: Once a week    Attends Religious Services: More than 4 times per year    Active Member of [employer] or Organizations: No    Attends Banker Meetings: Never    Marital Status: Living with partner  Intimate Partner Violence: Not At Risk (09/27/2023)   Humiliation, Afraid, Rape, and Kick questionnaire    Fear of Current or Ex-Partner: No    Emotionally Abused: No    Physically Abused: No    Sexually Abused: No    Physical Exam: Vital signs in last 24 hours: BP (!) 137/95   Pulse 80    Temp 98.2 F (36.8 C)   Ht 5\' 6"  (1.676 m)   Wt 198 lb (89.8 kg)   SpO2 100%   BMI 31.96 kg/m  GEN: NAD EYE: Sclerae anicteric ENT: MMM CV: Non-tachycardic Pulm: No increased work of breathing GI: Soft, NT/ND NEURO:  Alert & Oriented   Regino Caprio, MD [facility]  11/19/2023 11:26 AM

## 2023-11-19 NOTE — Progress Notes (Signed)
 Sedate, gd SR, tolerated procedure well, VSS, report to RN

## 2023-11-20 ENCOUNTER — Encounter: Payer: Self-pay | Admitting: Family Medicine

## 2023-11-20 ENCOUNTER — Ambulatory Visit: Attending: Family Medicine | Admitting: Family Medicine

## 2023-11-20 ENCOUNTER — Telehealth: Payer: Self-pay

## 2023-11-20 VITALS — BP 132/84 | HR 87 | Ht 66.0 in | Wt 198.2 lb

## 2023-11-20 DIAGNOSIS — Z131 Encounter for screening for diabetes mellitus: Secondary | ICD-10-CM | POA: Diagnosis not present

## 2023-11-20 DIAGNOSIS — R002 Palpitations: Secondary | ICD-10-CM

## 2023-11-20 DIAGNOSIS — F32A Depression, unspecified: Secondary | ICD-10-CM

## 2023-11-20 DIAGNOSIS — E876 Hypokalemia: Secondary | ICD-10-CM | POA: Diagnosis not present

## 2023-11-20 DIAGNOSIS — E0590 Thyrotoxicosis, unspecified without thyrotoxic crisis or storm: Secondary | ICD-10-CM

## 2023-11-20 DIAGNOSIS — F419 Anxiety disorder, unspecified: Secondary | ICD-10-CM | POA: Diagnosis not present

## 2023-11-20 DIAGNOSIS — M5412 Radiculopathy, cervical region: Secondary | ICD-10-CM

## 2023-11-20 DIAGNOSIS — G44209 Tension-type headache, unspecified, not intractable: Secondary | ICD-10-CM

## 2023-11-20 DIAGNOSIS — C50411 Malignant neoplasm of upper-outer quadrant of right female breast: Secondary | ICD-10-CM

## 2023-11-20 DIAGNOSIS — Z171 Estrogen receptor negative status [ER-]: Secondary | ICD-10-CM

## 2023-11-20 DIAGNOSIS — K0889 Other specified disorders of teeth and supporting structures: Secondary | ICD-10-CM

## 2023-11-20 MED ORDER — METOPROLOL SUCCINATE ER 25 MG PO TB24: 12.5000 mg | ORAL_TABLET | Freq: Every day | ORAL | 1 refills | Status: DC

## 2023-11-20 MED ORDER — RIZATRIPTAN BENZOATE 10 MG PO TABS: 10.0000 mg | ORAL_TABLET | ORAL | 2 refills | Status: DC | PRN

## 2023-11-20 MED ORDER — VENLAFAXINE HCL ER 75 MG PO CP24: 75.0000 mg | ORAL_CAPSULE | Freq: Every day | ORAL | 1 refills | Status: DC

## 2023-11-20 MED ORDER — CYCLOBENZAPRINE HCL 10 MG PO TABS: 10.0000 mg | ORAL_TABLET | Freq: Two times a day (BID) | ORAL | 1 refills | Status: DC | PRN

## 2023-11-20 MED ORDER — TOPIRAMATE 50 MG PO TABS: 50.0000 mg | ORAL_TABLET | Freq: Two times a day (BID) | ORAL | 1 refills | Status: DC

## 2023-11-20 MED ORDER — CLINDAMYCIN HCL 300 MG PO CAPS: 300.0000 mg | ORAL_CAPSULE | Freq: Two times a day (BID) | ORAL | 0 refills | Status: AC

## 2023-11-20 MED ORDER — HYDROXYZINE HCL 25 MG PO TABS: ORAL_TABLET | ORAL | 1 refills | Status: DC

## 2023-11-20 NOTE — Telephone Encounter (Signed)
 Left message
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
## 2023-11-20 NOTE — Patient Instructions (Signed)
 VISIT SUMMARY:  Today, we discussed your left ear pain, weight loss, migraines, and anxiety. We also reviewed your general health maintenance and provided recommendations for physical activity.  YOUR PLAN:  -LEFT EAR PAIN WITH SUSPECTED DENTAL ORIGIN: Your left ear pain is likely due to a dental infection from a broken tooth. Since you are intolerant to penicillin, we have prescribed clindamycin to treat the infection. Please see a dentist for further evaluation and treatment, especially one who accepts Medicaid.  -WEIGHT LOSS: Your weight loss of 10 pounds over the past two months is not excessive, especially after stopping steroids. We will monitor your weight and appetite, and check your thyroid  function, screen for diabetes, and check your cholesterol and potassium levels.  -MIGRAINE HEADACHES: Your migraines are well-controlled with your current medications (Maxalt , Topamax , and Effexor ). Please continue taking these medications as prescribed.  -ANXIETY: Your anxiety is affecting your ability to participate in crowded activities. We will arrange for you to see a new counselor for regular sessions. Additionally, engaging in activities like swimming during less crowded times may help manage your anxiety.  -GENERAL HEALTH MAINTENANCE: We discussed the importance of regular physical activity for your overall health and anxiety management. Please continue with your walking routine and consider swimming at the [facility] for additional support.  INSTRUCTIONS:  Please follow up with a dentist for your dental infection and evaluation. We will also monitor your weight and appetite, and conduct tests for thyroid  function, diabetes, cholesterol, and potassium levels. Continue taking your migraine medications as prescribed and engage in regular physical activity. We will arrange for you to see a new counselor for your anxiety.

## 2023-11-21 ENCOUNTER — Ambulatory Visit: Payer: Self-pay | Admitting: Family Medicine

## 2023-11-21 LAB — LP+NON-HDL CHOLESTEROL
Cholesterol, Total: 176 mg/dL (ref 100–199)
HDL: 28 mg/dL — ABNORMAL LOW (ref >39)
LDL Chol Calc (NIH): 119 mg/dL — ABNORMAL HIGH (ref 0–99)
Total Non-HDL-Chol (LDL+VLDL): 148 mg/dL — ABNORMAL HIGH (ref 0–129)
Triglycerides: 163 mg/dL — ABNORMAL HIGH (ref 0–149)
VLDL Cholesterol Cal: 29 mg/dL (ref 5–40)

## 2023-11-21 LAB — BASIC METABOLIC PANEL WITH GFR
BUN/Creatinine Ratio: 11 (ref 9–23)
BUN: 8 mg/dL (ref 6–24)
CO2: 21 mmol/L (ref 20–29)
Calcium: 9.4 mg/dL (ref 8.7–10.2)
Chloride: 105 mmol/L (ref 96–106)
Creatinine, Ser: 0.74 mg/dL (ref 0.57–1.00)
Glucose: 94 mg/dL (ref 70–99)
Potassium: 3.7 mmol/L (ref 3.5–5.2)
Sodium: 142 mmol/L (ref 134–144)
eGFR: 103 mL/min/{1.73_m2} (ref >59)

## 2023-11-21 LAB — HEMOGLOBIN A1C
Est. average glucose Bld gHb Est-mCnc: 114 mg/dL
Hgb A1c MFr Bld: 5.6 % (ref 4.8–5.6)

## 2023-11-22 ENCOUNTER — Other Ambulatory Visit: Payer: Self-pay | Admitting: Licensed Clinical Social Worker

## 2023-11-22 DIAGNOSIS — R002 Palpitations: Secondary | ICD-10-CM | POA: Insufficient documentation

## 2023-11-22 DIAGNOSIS — R072 Precordial pain: Secondary | ICD-10-CM | POA: Insufficient documentation

## 2023-11-22 NOTE — Progress Notes (Deleted)
  Cardiology Office Note:   Date:  11/22/2023  ID:  Cosmo Dirk, DOB 10-Jul-1980, MRN [id number] PCP: Joaquin Mulberry, MD  [facility] Providers Cardiologist:  Avery Bodo, MD {  History of Present Illness:   Laurie Bell is a 43 y.o. female with palpitations who was seen by Dr. Jacquelynn Matter.   She has had chest pain that he thought was non anginal.  She has had a normal echo and I see a CT from Jan that ruled out PE and did not mention any coronary calcium . ***     ***  Records show: Upon exam in office today patient's heart rhythm is irregular.  EKG showed multiple PVCs.  Patient does have a history of hypokalemia.  We will check labs today.  We will place referral to cardiology.  Patient does have a history of a fall 2 years ago and has been having intermittent numbness and pain to the left side of her body since the fall.  We will place referral to neurology as well. ECho in 08/2020 showed: Left ventricular ejection fraction, by estimation, is 65 to 70%. The  left ventricle has normal function. The left ventricle has no regional  wall motion abnormalities. Left ventricular diastolic parameters were  normal.   2. Right ventricular systolic function is normal. The right ventricular  size is normal.   3. The mitral valve is normal in structure. Trivial mitral valve  regurgitation.   4. The aortic valve is normal in structure. Aortic valve regurgitation is  not visualized.   ROS: ***  Studies Reviewed:    EKG:       ***  Risk Assessment/Calculations:   {Does this patient have ATRIAL FIBRILLATION?[phone]} No BP recorded.  {Refresh Note OR Click here to enter BP  :1}***        Physical Exam:   VS:  LMP  (Exact Date)    Wt Readings from Last 3 Encounters:  11/20/23 198 lb 3.2 oz (89.9 kg)  11/19/23 198 lb (89.8 kg)  10/24/23 198 lb (89.8 kg)     GEN: Well nourished, well developed in no acute distress NECK: No JVD; No carotid bruits CARDIAC: ***RR,  *** murmurs, rubs, gallops RESPIRATORY:  Clear to auscultation without rales, wheezing or rhonchi  ABDOMEN: Soft, non-tender, non-distended EXTREMITIES:  No edema; No deformity   ASSESSMENT AND PLAN:    PVCs:   ***  Noted on prior ECG.  Multiple people in the family with thyroid  problems.  Will check TSH today. I explained to her that these are benign.  Anxiety/panic attack:  ***  Can feel racing heart when she has a panic attack.  On meds per PMD.   Chest pain:  ***   Related to certain foods.  Not related to exertion.  Multiple negative troponins.  Normal echo.  If symptoms persist, we could consider further testing with a coronary CTA but at this time, I think this is unlikely to be related to  heart.  GI w/u in progress.      Follow up ***  Signed, Eilleen Grates, MD

## 2023-11-23 ENCOUNTER — Ambulatory Visit: Admitting: Cardiology

## 2023-11-23 DIAGNOSIS — R002 Palpitations: Secondary | ICD-10-CM

## 2023-11-23 DIAGNOSIS — R072 Precordial pain: Secondary | ICD-10-CM

## 2023-11-23 NOTE — Patient Instructions (Signed)
 Visit Information  Ms. Schnider was given information about Medicaid Managed Care team care coordination services as a part of their [employer] Medicaid benefit. Raynette Cam Pridgen verbally consentedto engagement with the [facility] Managed Care team.   If you are experiencing a medical emergency, please call 911 or report to your local emergency department or urgent care.   If you have a non-emergency medical problem during routine business hours, please contact your provider's office and ask to speak with a nurse.   For questions related to your [employer] [facility] health plan, please call: [phone]  OR visit the member homepage at: [email or web address]  If you would like to schedule transportation through your [employer] [facility] plan, please call the following number at least 2 days in advance of your appointment: [phone]  If you are experiencing a behavioral health crisis, call the [employer] [place]  Behavioral Health Crisis Line at [phone]). The line is available 24 hours a day, seven days a week.  If you would like help to quit smoking, call 1-800-QUIT-NOW (858-673-7198) OR Espaol: 1-855-Djelo-Ya (2-440-102-7253) o para ms informacin haga clic aqu or Text READY to 664-403 to register via text   Ms. Stockdale - following are the goals we discussed in your visit today:    Goals Addressed             This Visit's Progress    LCSW VBCI Social Work Care Plan   On track    Problems:   Disease Management support and education needs related to Anxiety with Panic Symptoms,  CSW Clinical Goal(s):   Over the next 90 days the Patient will attend all scheduled medical appointments as evidenced by patient report and care team review of appointment completion in electronic MEDICAL RECORD NUMBER  demonstrate a reduction in symptoms related to Anxiety with Panic Symptoms,  .  Interventions:  Mental Health:  Evaluation of current treatment plan related to Anxiety with Panic Symptoms, Active listening / Reflection utilized Emotional Support Provided Reviewed mental health medications and discussed importance of compliance:   Solution-Focued Strategies employed:   Patient Goals/Self-Care Activities:  Increase coping skills and healthy habits  Follow up on behavioral health agencies provided by PCP office to establish counseling  Utilize link LCSW emailed to find in-network counselors   Plan:   Telephone follow up appointment with care management team member scheduled for:  2-4 weeks        Please see education materials related to topics discussed provided by MyChart link.  Patient verbalizes understanding of instructions and care plan provided today and agrees to view in MyChart. Active MyChart status and patient understanding of how to access instructions and care plan via MyChart confirmed with patient.     Licensed Clinical Social Worker will follow up in 2-4 weeks  Alease Hunter, LCSW [facility]  [facility], [facility] Clinical Social Worker Direct Dial: [phone]  Fax: [phone] Website: [email or web address] 9:55 AM   Following is a copy of your plan of care:  There are no care plans that you recently modified to display for this patient.

## 2023-11-23 NOTE — Patient Outreach (Signed)
 Complex Care Management   Visit Note  11/22/2023  Name:  Laurie Bell MRN: [id number] DOB: July 12, 1980  Situation: Referral received for Complex Care Management related to Mental/Behavioral Health diagnosis MDD/GAD I obtained verbal consent from Patient.  Visit completed with pt  on the phone  Background:   Past Medical History:  Diagnosis Date   Allergy    Anxiety    Arthritis    Breast cancer (HCC)    Chlamydia contact, treated    GERD (gastroesophageal reflux disease)    Hyperthyroidism     Assessment: Patient Reported Symptoms:  Cognitive Cognitive Status: Alert and oriented to person, place, and time, Normal speech and language skills Cognitive/Intellectual Conditions Management [RPT]: None reported or documented in medical history or problem list      Neurological Neurological Review of Symptoms: No symptoms reported    HEENT HEENT Symptoms Reported: No symptoms reported      Cardiovascular Cardiovascular Symptoms Reported: Not assessed Cardiovascular Comment: Pt has Cardiology appt scheduled for 11/23/23  Respiratory Respiratory Symptoms Reported: No symptoms reported    Endocrine Patient reports the following symptoms related to hypoglycemia or hyperglycemia : Not assessed Is patient diabetic?: No    Gastrointestinal Gastrointestinal Symptoms Reported: No symptoms reported      Genitourinary Genitourinary Symptoms Reported: No symptoms reported    Integumentary Integumentary Symptoms Reported: Not assessed    Musculoskeletal Musculoskelatal Symptoms Reviewed: Not assessed        Psychosocial Psychosocial Symptoms Reported: Other Other Psychosocial Conditions: Stress Management Additional Psychological Details: Patient is interested in establishing counseling to assist with symptom management. Pt agreed to f/up with resources provided at recent PCP office. She is interested in in-person only sessions. LCSW provided link for pt to obtain in-network  provider listing. Behavioral Health Conditions: Anxiety, Depression Behavioral Management Strategies: Adequate rest, Community resources, Support system, Medication therapy Major Change/Loss/Stressor/Fears (CP): Medical condition, self Techniques to Cope with Loss/Stress/Change: Diversional activities        10/17/2023    3:43 PM  Depression screen PHQ 2/9  Decreased Interest 1  Down, Depressed, Hopeless 0  PHQ - 2 Score 1  Altered sleeping 2  Tired, decreased energy 2  Change in appetite 0  Feeling bad or failure about yourself  0  Trouble concentrating 1  Moving slowly or fidgety/restless 0  Suicidal thoughts 0  PHQ-9 Score 6  Difficult doing work/chores Somewhat difficult    There were no vitals filed for this visit.  Medications Reviewed Today     Reviewed by Adriana Albany, LCSW (Social Worker) on 11/23/23 at [phone]  Med List Status: <None>   Medication Order Taking? Sig Documenting Provider Last Dose Status Informant  0.9 %  sodium chloride  infusion [id number]   Dorsey, Ying C, MD  Active   0.9 %  sodium chloride  infusion [id number]   Daina Drum, MD  Active   acetaminophen  (TYLENOL ) 500 MG tablet [id number] Yes Take 1,000 mg by mouth every 6 (six) hours as needed for moderate pain. [provider]  Active Self, Pharmacy Records  albuterol  (VENTOLIN  HFA) 108 (90 Base) MCG/ACT inhaler [id number] Yes TAKE 2 PUFFS BY MOUTH EVERY 6 HOURS AS NEEDED FOR WHEEZE OR SHORTNESS OF BREATH  Patient taking differently: Inhale 2 puffs into the lungs every 6 (six) hours as needed for wheezing or shortness of breath.   Newlin, Enobong, MD  Active Self, Pharmacy Records  AMBULATORY [facility] MEDICATION [id number] Yes Medication Name: GI Cocktail: 270 mL viscous Xylocaine  2%, 270  mL Dicyclomine 10 mg/5 mL, 810 mL Mylanta/Maylox. Take 8-10 mL every 4-6 hours as needed for severe pain Dispense 900 mL Daina Drum, MD  Active   clindamycin  (CLEOCIN ) 300 MG capsule [id number] Yes  Take 1 capsule (300 mg total) by mouth 2 (two) times daily. Newlin, Enobong, MD  Active   cyclobenzaprine  (FLEXERIL ) 10 MG tablet [id number] Yes Take 1 tablet (10 mg total) by mouth 2 (two) times daily as needed for muscle spasms. TAKE 1 TABLET BY MOUTH AT BEDTIME AS NEEDED FOR MUSCLE SPASMS Newlin, Enobong, MD  Active   esomeprazole  (NEXIUM ) 40 MG capsule [id number] Yes Take 1 capsule (40 mg total) by mouth 2 (two) times daily before a meal. Daina Drum, MD  Active   famotidine  (PEPCID ) 40 MG tablet [id number] Yes Take 1 tablet (40 mg total) by mouth at bedtime. Suzanna Erp M, PA-C  Active   fluticasone  (FLONASE ) 50 MCG/ACT nasal spray [id number] Yes Place 2 sprays into both nostrils daily. Dulce Gibbs M, PA-C  Active   gabapentin  (NEURONTIN ) 400 MG capsule [id number] Yes TAKE 1 CAPSULE BY MOUTH IN AM AND NOON, AND 2 CAPSULE BY MOUTH AT BEDTIME Gudena, Vinay, MD  Active   hydrOXYzine  (ATARAX ) 25 MG tablet [id number] Yes TAKE 1 TABLET(25 MG) BY MOUTH THREE TIMES DAILY Newlin, Enobong, MD  Active   ipratropium (ATROVENT ) 0.03 % nasal spray [id number] Yes Place into the nose. [provider]  Active   linaclotide  (LINZESS ) 145 MCG CAPS capsule [id number] Yes Take 1 capsule (145 mcg total) by mouth daily before breakfast.  Patient taking differently: Take 145 mcg by mouth as needed.   Daina Drum, MD  Active Self, Pharmacy Records   Patient not taking:   Discontinued 02/02/20 1436 methimazole  (TAPAZOLE ) 5 MG tablet [id number] Yes Take 1 tablet (5 mg total) by mouth as directed. 1 tablet Monday through Thursday, skip rest of the week Shamleffer, Ibtehal Jaralla, MD  Active   metoprolol  succinate (TOPROL -XL) 25 MG 24 hr tablet [id number] Yes Take 0.5 tablets (12.5 mg total) by mouth daily. For increased heart rate Joaquin Mulberry, MD  Active   naloxone (NARCAN) nasal spray 4 mg/0.1 mL [id number] Yes 1 (ONE) SPRAY AS NEEDED [provider]  Active   naproxen  (NAPROSYN ) 500 MG tablet  [id number] Yes TAKE 1 TABLET BY MOUTH TWICE A DAY AS NEEDED FOR PAIN Newlin, Enobong, MD  Active Self, Pharmacy Records  oxyCODONE -acetaminophen  (PERCOCET/ROXICET) 5-325 MG tablet [id number] Yes Take 1 tablet by mouth 4 (four) times daily. [provider]  Active Self, Pharmacy Records  potassium chloride  (KLOR-CON ) 10 MEQ tablet [id number] Yes Take by mouth. [provider]  Active   rizatriptan  (MAXALT ) 10 MG tablet [id number] Yes Take 1 tablet (10 mg total) by mouth as needed for migraine. May repeat in 2 hours if needed Newlin, Enobong, MD  Active   sucralfate  (CARAFATE ) 1 GM/10ML suspension [id number] Yes Take 10 mLs (1 g total) by mouth 4 (four) times daily. Take 2 teaspoons with each meal and 2 teaspoons at bedtime. Do not take within 2 hours of any other medications Tory Freiberg, NP  Active   sucralfate  (CARAFATE ) 1 GM/10ML suspension [id number]  Take 10 mLs (1 g total) by mouth 4 (four) times daily. Daina Drum, MD  Expired 10/17/23 2359   topiramate  (TOPAMAX ) 50 MG tablet [id number] Yes Take 1 tablet (50 mg total) by mouth 2 (two) times daily. Newlin, Enobong, MD  Active   venlafaxine  XR (EFFEXOR  XR) 75  MG 24 hr capsule [id number] Yes Take 1 capsule (75 mg total) by mouth daily with breakfast. Joaquin Mulberry, MD  Active   Vitamin D , Ergocalciferol , (DRISDOL) 1.25 MG (50000 UNIT) CAPS capsule [id number] Yes Take 50,000 Units by mouth once a week. [provider]  Active Self, Pharmacy Records  zolpidem  (AMBIEN ) 5 MG tablet [id number] Yes Take 1 tablet (5 mg total) by mouth at bedtime as needed for sleep. Gudena, Vinay, MD  Active Self, Pharmacy Records            Recommendation:   Continue Current Plan of Care  Follow Up Plan:   Telephone follow-up in 1 month  Alease Hunter, LCSW [facility]  [facility], [facility] Clinical Social Worker Direct Dial: [phone]  Fax: [phone] Website: [email or web address] 9:55  AM

## 2023-11-28 DIAGNOSIS — I493 Ventricular premature depolarization: Secondary | ICD-10-CM | POA: Insufficient documentation

## 2023-11-28 NOTE — Progress Notes (Deleted)
  Cardiology Office Note:   Date:  11/28/2023  ID:  Laurie Bell, DOB 11/11/1980, MRN [id number] PCP: Laurie Mulberry, MD  [facility] Providers Cardiologist:  Laurie Bodo, MD {  History of Present Illness:   Laurie Bell is a 43 y.o. female with palpitations who was seen by Dr. Jacquelynn Bell.   She has had chest pain that he thought was non anginal.  She has had a normal echo and I see a CT from Jan that ruled out PE and did not mention any coronary calcium . ***     ***  Records show: Upon exam in office today patient's heart rhythm is irregular.  EKG showed multiple PVCs.  Patient does have a history of hypokalemia.  We will check labs today.  We will place referral to cardiology.  Patient does have a history of a fall 2 years ago and has been having intermittent numbness and pain to the left side of her body since the fall.  We will place referral to neurology as well. ECho in 08/2020 showed: Left ventricular ejection fraction, by estimation, is 65 to 70%. The  left ventricle has normal function. The left ventricle has no regional  wall motion abnormalities. Left ventricular diastolic parameters were  normal.   2. Right ventricular systolic function is normal. The right ventricular  size is normal.   3. The mitral valve is normal in structure. Trivial mitral valve  regurgitation.   4. The aortic valve is normal in structure. Aortic valve regurgitation is  not visualized.   ROS: ***  Studies Reviewed:    EKG:       ***  Risk Assessment/Calculations:   {Does this patient have ATRIAL FIBRILLATION?[phone]} No BP recorded.  {Refresh Note OR Click here to enter BP  :1}***        Physical Exam:   VS:  There were no vitals taken for this visit.   Wt Readings from Last 3 Encounters:  11/20/23 198 lb 3.2 oz (89.9 kg)  11/19/23 198 lb (89.8 kg)  10/24/23 198 lb (89.8 kg)     GEN: Well nourished, well developed in no acute distress NECK: No JVD; No carotid  bruits CARDIAC: ***RR, *** murmurs, rubs, gallops RESPIRATORY:  Clear to auscultation without rales, wheezing or rhonchi  ABDOMEN: Soft, non-tender, non-distended EXTREMITIES:  No edema; No deformity   ASSESSMENT AND PLAN:    PVCs:   ***  Noted on prior ECG.  Multiple people in the family with thyroid  problems.  Will check TSH today. I explained to her that these are benign.  Anxiety/panic attack:  ***  Can feel racing heart when she has a panic attack.  On meds per PMD.   Chest pain:  ***   Related to certain foods.  Not related to exertion.  Multiple negative troponins.  Normal echo.  If symptoms persist, we could consider further testing with a coronary CTA but at this time, I think this is unlikely to be related to  heart.  GI w/u in progress.      Follow up ***  Signed, Eilleen Grates, MD

## 2023-11-29 ENCOUNTER — Ambulatory Visit: Attending: Cardiology | Admitting: Cardiology

## 2023-11-29 DIAGNOSIS — I493 Ventricular premature depolarization: Secondary | ICD-10-CM

## 2023-11-29 DIAGNOSIS — R072 Precordial pain: Secondary | ICD-10-CM

## 2023-12-03 ENCOUNTER — Other Ambulatory Visit (HOSPITAL_COMMUNITY): Payer: Self-pay

## 2023-12-03 ENCOUNTER — Other Ambulatory Visit: Payer: Self-pay | Admitting: Internal Medicine

## 2023-12-03 MED ORDER — ESOMEPRAZOLE MAGNESIUM 40 MG PO CPDR
40.0000 mg | DELAYED_RELEASE_CAPSULE | Freq: Two times a day (BID) | ORAL | 4 refills | Status: DC
  Filled 2023-12-03: qty 60, 30d supply, fill #0
  Filled 2024-01-21: qty 60, 30d supply, fill #1

## 2023-12-09 IMAGING — CT CT NECK W/ CM
4 of 5 series · 14 of 33 positions shown, 16 images · IV contrast (OMNIPAQUE 300)
Comparison: None.

CLINICAL DATA: Gastroesophageal reflux. Dysphagia. Gastric ulcers.
Thyroid nodule.

EXAM:
CT NECK WITH CONTRAST
TECHNIQUE: Multidetector CT imaging of the neck was performed using the
standard protocol following the bolus administration of intravenous
contrast.
CONTRAST:  75mL OMNIPAQUE IOHEXOL 300 MG/ML  SOLN

[Series 6: bone · axial · 0.38mm/px · z∈[-234,-122]mm · 3 of 113 slices shown, 4 images]
[im 29/113  soft-tissue]
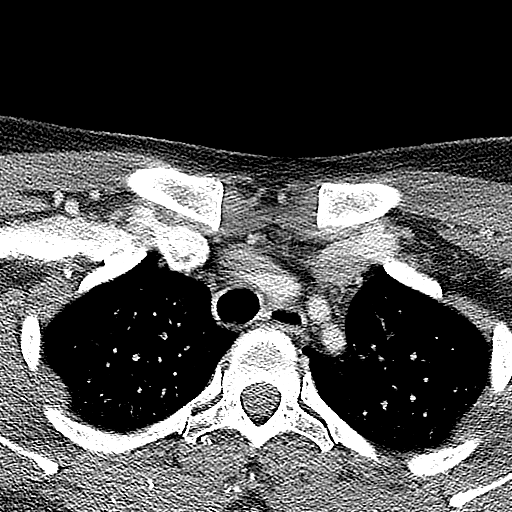
[im 29/113  bone]
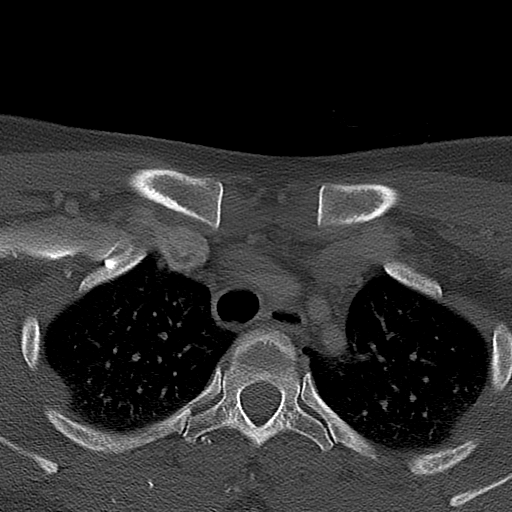
[im 57/113  bone]
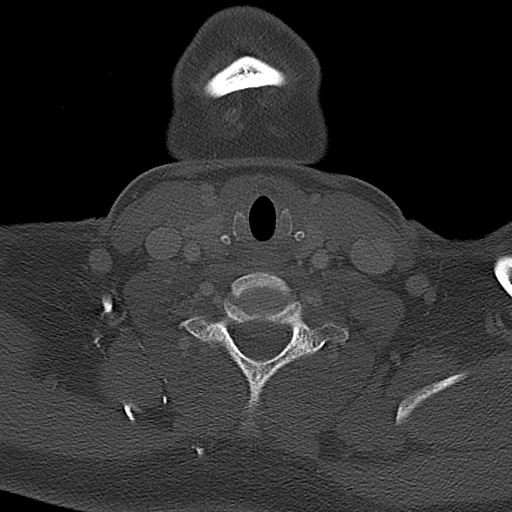
[im 85/113  bone]
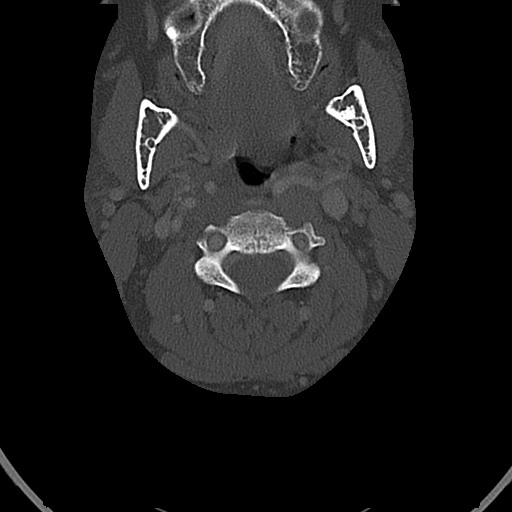

[Series 7: coronal st · coronal · 0.37mm/px · 3 of 95 slices shown]
[im 26/95  bone]
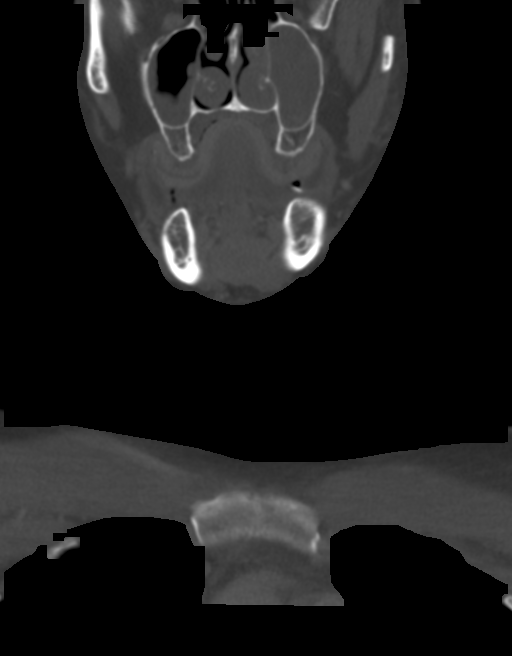
[im 40/95  bone]
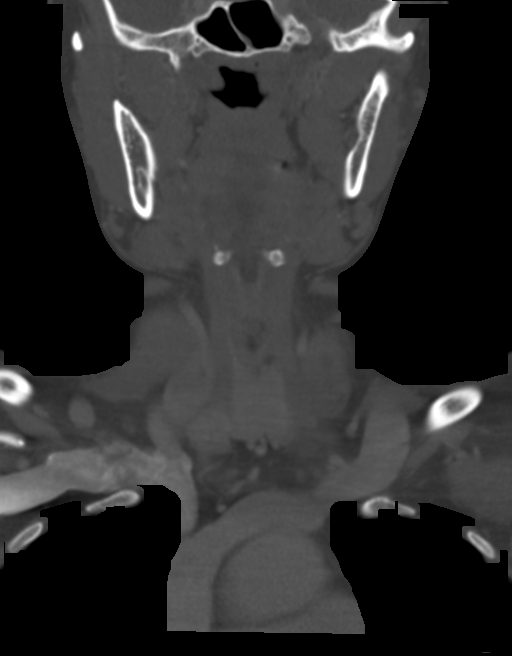
[im 55/95  bone]
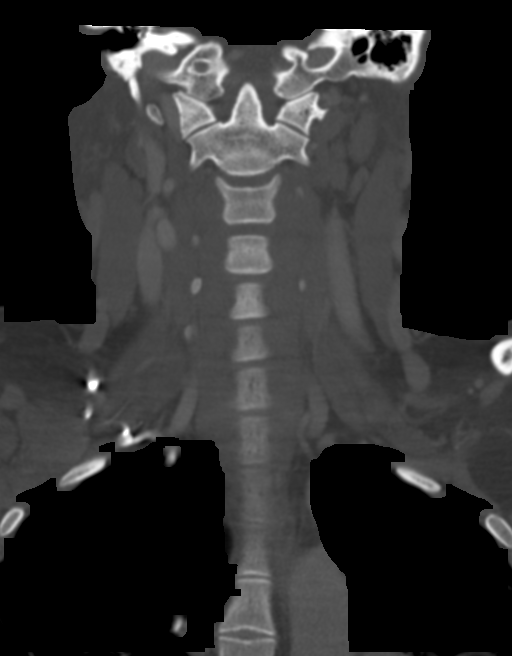

[Series 8: sagittal st · sagittal · 0.37mm/px · 5 of 95 slices shown, 6 images]
[im 32/95  bone]
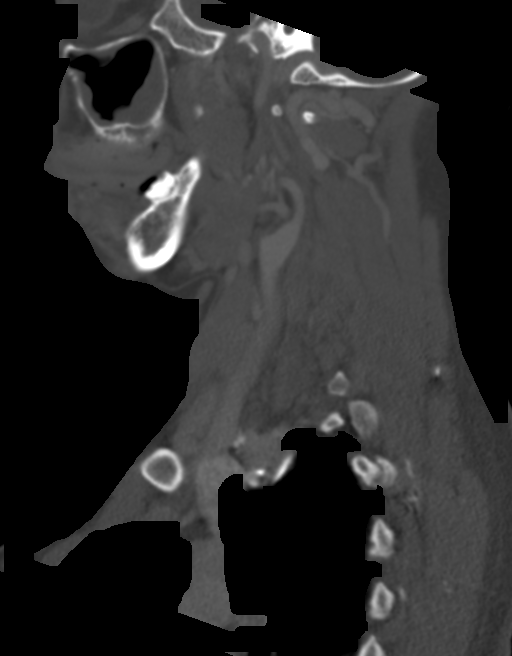
[im 40/95  bone]
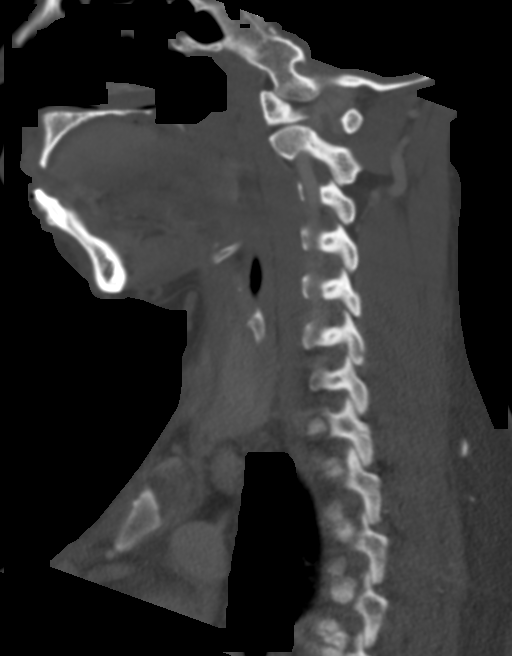
[im 48/95  soft-tissue]
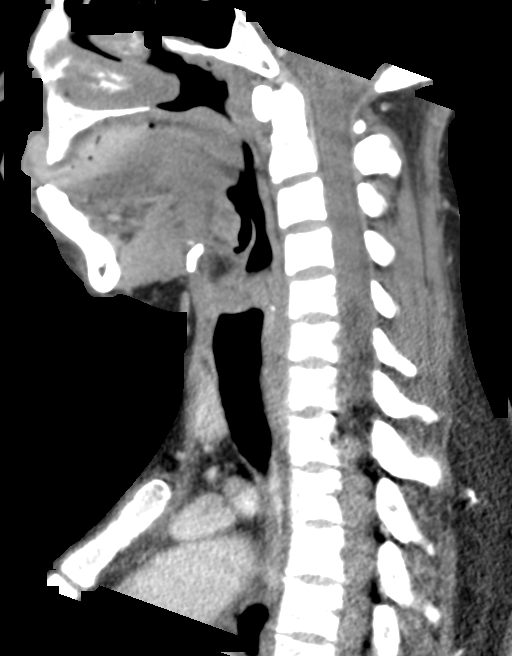
[im 48/95  bone]
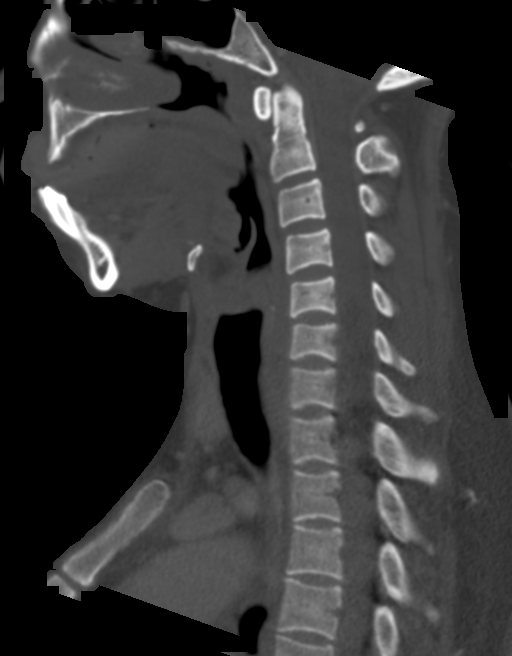
[im 55/95  bone]
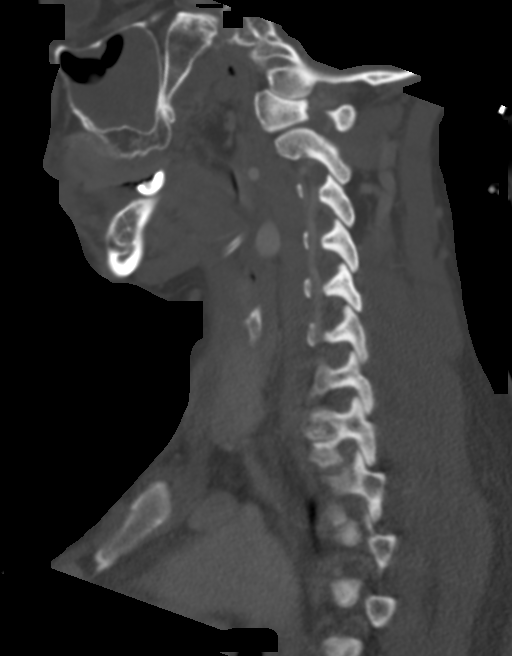
[im 63/95  bone]
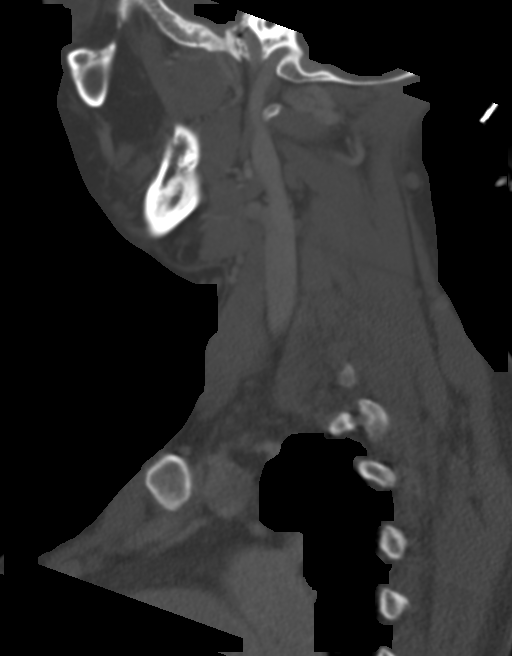

[Series 9: orthogonal · axial · 0.37mm/px · z∈[-265,-152]mm · 3 of 122 slices shown]
[im 31/122  bone]
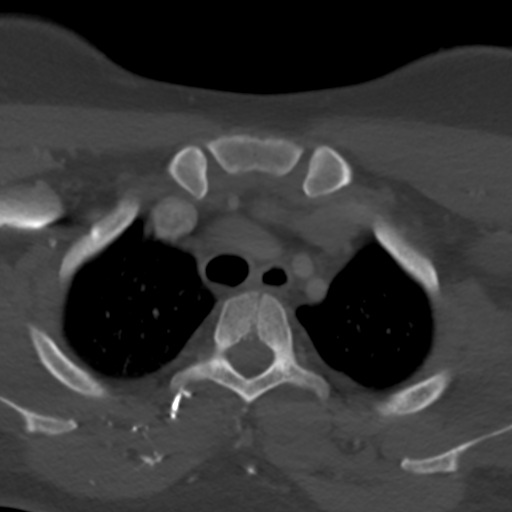
[im 61/122  bone]
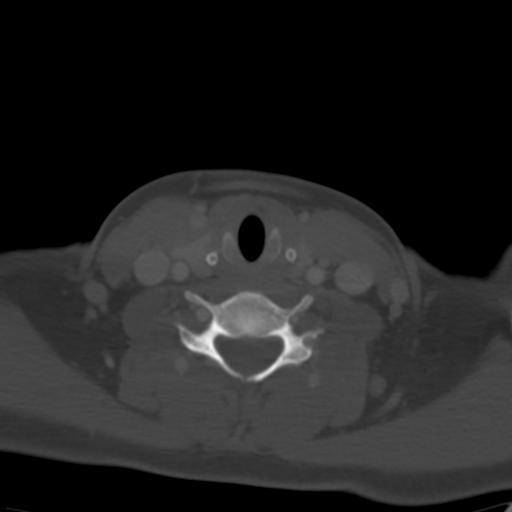
[im 91/122  bone]
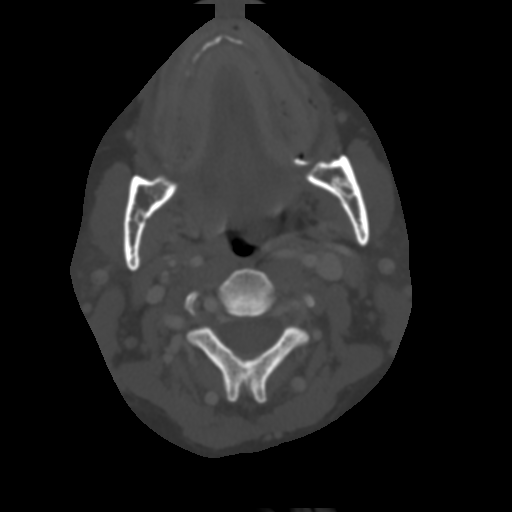

[14 of 33 positions shown; findings below may reference images not displayed]

FINDINGS: Pharynx and larynx: Moderate motion artifact through the oropharynx.
No evidence of a mass or significant swelling within this
limitation. No fluid collection or inflammatory changes in the
parapharyngeal or retropharyngeal spaces.

Salivary glands: No inflammation, mass, or stone.

Thyroid: Subcentimeter calcification in the right thyroid lobe for
which no follow-up imaging is recommended.

Lymph nodes: No enlarged or suspicious lymph nodes in the neck.

Vascular: Major vascular structures of the neck are patent.

Limited intracranial: Unremarkable.

Visualized orbits: Unremarkable.

Mastoids and visualized paranasal sinuses: Circumferential mucosal
thickening in the maxillary sinuses with fluid, left greater than
right. Visualized mastoid air cells are clear.

Skeleton: No acute osseous abnormality or suspicious osseous lesion.

Upper chest: Clear lung apices.

Other: None.
IMPRESSION: 1. No acute abnormality or mass identified in the neck.
2. Mucosal thickening and fluid in the maxillary sinuses, correlate
for acute sinusitis.

## 2023-12-13 ENCOUNTER — Other Ambulatory Visit: Payer: Self-pay | Admitting: Licensed Clinical Social Worker

## 2023-12-18 ENCOUNTER — Emergency Department (HOSPITAL_COMMUNITY)
Admission: EM | Admit: 2023-12-18 | Discharge: 2023-12-18 | Disposition: A | Discharge status: Home / Self Care | Attending: Emergency Medicine | Admitting: Emergency Medicine

## 2023-12-18 ENCOUNTER — Encounter (HOSPITAL_COMMUNITY): Payer: Self-pay | Admitting: *Deleted

## 2023-12-18 ENCOUNTER — Emergency Department (HOSPITAL_COMMUNITY)

## 2023-12-18 ENCOUNTER — Other Ambulatory Visit: Payer: Self-pay

## 2023-12-18 DIAGNOSIS — Z853 Personal history of malignant neoplasm of breast: Secondary | ICD-10-CM | POA: Insufficient documentation

## 2023-12-18 DIAGNOSIS — R079 Chest pain, unspecified: Secondary | ICD-10-CM

## 2023-12-18 DIAGNOSIS — R112 Nausea with vomiting, unspecified: Secondary | ICD-10-CM | POA: Insufficient documentation

## 2023-12-18 DIAGNOSIS — R002 Palpitations: Secondary | ICD-10-CM | POA: Insufficient documentation

## 2023-12-18 DIAGNOSIS — R1010 Upper abdominal pain, unspecified: Secondary | ICD-10-CM | POA: Diagnosis not present

## 2023-12-18 DIAGNOSIS — R197 Diarrhea, unspecified: Secondary | ICD-10-CM | POA: Diagnosis not present

## 2023-12-18 DIAGNOSIS — R0789 Other chest pain: Secondary | ICD-10-CM | POA: Insufficient documentation

## 2023-12-18 DIAGNOSIS — Z91040 Latex allergy status: Secondary | ICD-10-CM | POA: Insufficient documentation

## 2023-12-18 LAB — BASIC METABOLIC PANEL WITH GFR
Anion gap: 10 (ref 5–15)
BUN: 9 mg/dL (ref 6–20)
CO2: 23 mmol/L (ref 22–32)
Calcium: 8.9 mg/dL (ref 8.9–10.3)
Chloride: 105 mmol/L (ref 98–111)
Creatinine, Ser: 0.74 mg/dL (ref 0.44–1.00)
GFR, Estimated: >60 mL/min (ref >60)
Glucose, Bld: 108 mg/dL — ABNORMAL HIGH (ref 70–99)
Potassium: 3.5 mmol/L (ref 3.5–5.1)
Sodium: 138 mmol/L (ref 135–145)

## 2023-12-18 LAB — TROPONIN I (HIGH SENSITIVITY)
Troponin I (High Sensitivity): 6 ng/L (ref <18)
Troponin I (High Sensitivity): 4 ng/L (ref <18)

## 2023-12-18 LAB — MAGNESIUM: Magnesium: 2 mg/dL (ref 1.7–2.4)

## 2023-12-18 LAB — CBC
HCT: 36.7 % (ref 36.0–46.0)
Hemoglobin: 12.3 g/dL (ref 12.0–15.0)
MCH: 27.6 pg (ref 26.0–34.0)
MCHC: 33.5 g/dL (ref 30.0–36.0)
MCV: 82.3 fL (ref 80.0–100.0)
Platelets: 240 K/uL (ref 150–400)
RBC: 4.46 MIL/uL (ref 3.87–5.11)
RDW: 14 % (ref 11.5–15.5)
WBC: 5.9 K/uL (ref 4.0–10.5)
nRBC: 0 % (ref 0.0–0.2)

## 2023-12-18 LAB — HEPATIC FUNCTION PANEL
ALT: 18 U/L (ref 0–44)
AST: 19 U/L (ref 15–41)
Albumin: 3.6 g/dL (ref 3.5–5.0)
Alkaline Phosphatase: 192 U/L — ABNORMAL HIGH (ref 38–126)
Bilirubin, Direct: <0.1 mg/dL (ref 0.0–0.2)
Total Bilirubin: 0.4 mg/dL (ref 0.0–1.2)
Total Protein: 7.3 g/dL (ref 6.5–8.1)

## 2023-12-18 LAB — LIPASE, BLOOD: Lipase: 29 U/L (ref 11–51)

## 2023-12-18 LAB — HCG, SERUM, QUALITATIVE: Preg, Serum: NEGATIVE

## 2023-12-18 MED ORDER — IOHEXOL 350 MG/ML SOLN
75.0000 mL | Freq: Once | INTRAVENOUS | Status: AC | PRN
  Administered 2023-12-18: 75 mL via INTRAVENOUS

## 2023-12-18 MED ORDER — POTASSIUM CHLORIDE CRYS ER 20 MEQ PO TBCR
40.0000 meq | EXTENDED_RELEASE_TABLET | Freq: Once | ORAL | Status: AC
  Administered 2023-12-18: 40 meq via ORAL
  Filled 2023-12-18: qty 2

## 2023-12-18 MED ORDER — FAMOTIDINE IN NACL 20-0.9 MG/50ML-% IV SOLN
20.0000 mg | Freq: Once | INTRAVENOUS | Status: AC
  Administered 2023-12-18: 20 mg via INTRAVENOUS
  Filled 2023-12-18: qty 50

## 2023-12-18 MED ORDER — LIDOCAINE VISCOUS HCL 2 % MT SOLN
15.0000 mL | Freq: Once | OROMUCOSAL | Status: AC
  Administered 2023-12-18: 15 mL via ORAL
  Filled 2023-12-18: qty 15

## 2023-12-18 MED ORDER — ALUM & MAG HYDROXIDE-SIMETH 200-200-20 MG/5ML PO SUSP
30.0000 mL | Freq: Once | ORAL | Status: AC
  Administered 2023-12-18: 30 mL via ORAL
  Filled 2023-12-18: qty 30

## 2023-12-18 MED ORDER — METOCLOPRAMIDE HCL 10 MG PO TABS: 10.0000 mg | ORAL_TABLET | Freq: Four times a day (QID) | ORAL | 0 refills | Status: AC

## 2023-12-18 MED ORDER — FENTANYL CITRATE PF 50 MCG/ML IJ SOSY
50.0000 ug | PREFILLED_SYRINGE | Freq: Once | INTRAMUSCULAR | Status: AC
  Administered 2023-12-18: 50 ug via INTRAVENOUS
  Filled 2023-12-18: qty 1

## 2023-12-18 MED ORDER — METOCLOPRAMIDE HCL 5 MG/ML IJ SOLN
10.0000 mg | Freq: Once | INTRAMUSCULAR | Status: AC
  Administered 2023-12-18: 10 mg via INTRAVENOUS
  Filled 2023-12-18: qty 2

## 2023-12-18 NOTE — Patient Instructions (Signed)
 Visit Information  [name] was given information about Medicaid Managed Care team care coordination services as a part of their [employer] Medicaid benefit. Laurie Bell verbally consentedto engagement with the [facility] Managed Care team.   If you are experiencing a medical emergency, please call 911 or report to your local emergency department or urgent care.   If you have a non-emergency medical problem during routine business hours, please contact your provider's office and ask to speak with a nurse.   For questions related to your [facility] [facility] health plan, please call: [phone]  OR visit the member homepage at: [email or web address]  If you would like to schedule transportation through your [facility] plan, please call the following number at least 2 days in advance of your appointment: [phone]  If you are experiencing a behavioral health crisis, call the [employer] [place]  Behavioral Health Crisis Line at [phone]). The line is available 24 hours a day, seven days a week.  If you would like help to quit smoking, call 1-800-QUIT-NOW (463-687-6640) OR Espaol: 1-855-Djelo-Ya (8-144-664-6430) o para ms informacin haga clic aqu or Text READY to 799-599 to register via text   Laurie Bell - following are the goals we discussed in your visit today:    Goals Addressed             This Visit's Progress    LCSW VBCI Social Work Care Plan   On track    Problems:   Disease Management support and education needs related to Anxiety with Panic Symptoms,  CSW Clinical Goal(s):   Over the next 90 days the Patient will attend all scheduled medical appointments as evidenced by patient report and care team review of appointment completion in electronic MEDICAL RECORD NUMBER  demonstrate a reduction in symptoms related to Anxiety with Panic Symptoms,  .  Interventions:  Mental Health:  Evaluation of current treatment plan related to Anxiety with Panic Symptoms, Active listening / Reflection utilized Emotional Support Provided Reviewed mental health medications and discussed importance of compliance:   Solution-Focued Strategies employed:   Patient Goals/Self-Care Activities:  Increase coping skills and healthy habits  Follow up on behavioral health agencies provided by PCP office to establish counseling  Utilize link LCSW emailed to find in-network counselors   Plan:   Telephone follow up appointment with care management team member scheduled for:  2-4 weeks        Please see education materials related to topics discussed provided by MyChart link.  Patient verbalizes understanding of instructions and care plan provided today and agrees to view in MyChart. Active MyChart status and patient understanding of how to access instructions and care plan via MyChart confirmed with patient.     Licensed Visual merchandiser will f/up in 2-4 weeks  Laurie Kerns, LCSW [facility]  [facility], [facility] Clinical Social Worker Direct Dial: [phone]  Fax: [phone] Website: [email or web address] 11:14 AM   Following is a copy of your plan of care:  There are no care plans that you recently modified to display for this patient.

## 2023-12-18 NOTE — ED Provider Notes (Signed)
 [place] [facility] Provider Note   CSN: [id number] Arrival date & time: 12/18/23  1312     Patient presents with: Chest Pain and Abdominal Pain   Laurie Bell is a 43 y.o. female.    Chest Pain Associated symptoms: abdominal pain, nausea, palpitations and vomiting   Abdominal Pain Associated symptoms: chest pain, diarrhea, nausea and vomiting   Patient presents for chest pain.  Medical history includes fibroids, GERD, depression, anxiety, breast cancer, arthritis.  Breast cancer was diagnosed last year.  She underwent lumpectomy, radiation, and chemotherapy.  She completed treatment 6 months ago.  This morning she was in her normal state of health.  At her 32, while at work, patient had onset of anterior chest pain.  Pain seem to migrate to the left and right side of chest.  She had palpitations and to the dose of metoprolol .  She has been prescribed this in the past to take as needed for fast heart rate.  She had nausea and vomiting.  She had 1 episode of diarrhea.  She being the driver still to the hospital.  While driving, well that she had to pull over and call 911.  In the ambulance, patient was given 324 of ASA, 1 SL NTG, and 4 mg of Zofran .  Zofran  did resolve her nausea.  She feels that the NTG did improve her chest pain.  Currently, chest pain is 7/10 in severity.  Nausea continues to be resolved.  She describes an associated upper abdominal pain as well.      Prior to Admission medications   Medication Sig Start Date End Date Taking? Authorizing Provider  metoCLOPramide  (REGLAN ) 10 MG tablet Take 1 tablet (10 mg total) by mouth every 6 (six) hours. 12/18/23  Yes Melvenia Motto, MD  acetaminophen  (TYLENOL ) 500 MG tablet Take 1,000 mg by mouth every 6 (six) hours as needed for moderate pain.    [provider]  albuterol  (VENTOLIN  HFA) 108 (90 Base) MCG/ACT inhaler TAKE 2 PUFFS BY MOUTH EVERY 6 HOURS AS NEEDED FOR WHEEZE OR SHORTNESS OF  BREATH Patient taking differently: Inhale 2 puffs into the lungs every 6 (six) hours as needed for wheezing or shortness of breath. 12/22/22   Newlin, Enobong, MD  AMBULATORY NON FORMULARY MEDICATION Medication Name: GI Cocktail: 270 mL viscous Xylocaine  2%, 270 mL Dicyclomine 10 mg/5 mL, 810 mL Mylanta/Maylox. Take 8-10 mL every 4-6 hours as needed for severe pain Dispense 900 mL 11/19/23   Federico Rosario BROCKS, MD  clindamycin  (CLEOCIN ) 300 MG capsule Take 1 capsule (300 mg total) by mouth 2 (two) times daily. 11/20/23   Newlin, Enobong, MD  cyclobenzaprine  (FLEXERIL ) 10 MG tablet Take 1 tablet (10 mg total) by mouth 2 (two) times daily as needed for muscle spasms. TAKE 1 TABLET BY MOUTH AT BEDTIME AS NEEDED FOR MUSCLE SPASMS 11/20/23   Newlin, Enobong, MD  esomeprazole  (NEXIUM ) 40 MG capsule Take 1 capsule (40 mg total) by mouth 2 (two) times daily before a meal. 12/03/23   Federico Rosario BROCKS, MD  famotidine  (PEPCID ) 40 MG tablet Take 1 tablet (40 mg total) by mouth at bedtime. 08/31/23   McMichael, Nestor HERO, PA-C  fluticasone  (FLONASE ) 50 MCG/ACT nasal spray Place 2 sprays into both nostrils daily. 10/17/23   Danton Jon HERO, PA-C  gabapentin  (NEURONTIN ) 400 MG capsule TAKE 1 CAPSULE BY MOUTH IN AM AND NOON, AND 2 CAPSULE BY MOUTH AT BEDTIME 09/04/23   Odean Potts, MD  hydrOXYzine  (  ATARAX ) 25 MG tablet TAKE 1 TABLET(25 MG) BY MOUTH THREE TIMES DAILY 11/20/23   Newlin, Enobong, MD  ipratropium (ATROVENT ) 0.03 % nasal spray Place into the nose. 12/20/22   [provider]  linaclotide  (LINZESS ) 145 MCG CAPS capsule Take 1 capsule (145 mcg total) by mouth daily before breakfast. Patient taking differently: Take 145 mcg by mouth as needed. 05/19/22   Federico Rosario BROCKS, MD  methimazole  (TAPAZOLE ) 5 MG tablet Take 1 tablet (5 mg total) by mouth as directed. 1 tablet Monday through Thursday, skip rest of the week 10/02/23   Shamleffer, Ibtehal Jaralla, MD  metoprolol  succinate (TOPROL -XL) 25 MG 24 hr tablet Take 0.5  tablets (12.5 mg total) by mouth daily. For increased heart rate 11/20/23   Newlin, Enobong, MD  naloxone [facility]) nasal spray 4 mg/0.1 mL 1 (ONE) SPRAY AS NEEDED 07/13/23   [provider]  naproxen  (NAPROSYN ) 500 MG tablet TAKE 1 TABLET BY MOUTH TWICE A DAY AS NEEDED FOR PAIN 03/19/23   Delbert Clam, MD  oxyCODONE -acetaminophen  (PERCOCET/ROXICET) 5-325 MG tablet Take 1 tablet by mouth 4 (four) times daily. 07/13/23   [provider]  potassium chloride  (KLOR-CON ) 10 MEQ tablet Take by mouth. 08/08/22   [provider]  rizatriptan  (MAXALT ) 10 MG tablet Take 1 tablet (10 mg total) by mouth as needed for migraine. May repeat in 2 hours if needed 11/20/23   Newlin, Enobong, MD  sucralfate  (CARAFATE ) 1 GM/10ML suspension Take 10 mLs (1 g total) by mouth 4 (four) times daily. Take 2 teaspoons with each meal and 2 teaspoons at bedtime. Do not take within 2 hours of any other medications 08/09/23   Cara Elida HERO, NP  sucralfate  (CARAFATE ) 1 GM/10ML suspension Take 10 mLs (1 g total) by mouth 4 (four) times daily. 09/17/23 10/17/23  Federico Rosario BROCKS, MD  topiramate  (TOPAMAX ) 50 MG tablet Take 1 tablet (50 mg total) by mouth 2 (two) times daily. 11/20/23   Newlin, Enobong, MD  venlafaxine  XR (EFFEXOR  XR) 75 MG 24 hr capsule Take 1 capsule (75 mg total) by mouth daily with breakfast. 11/20/23   Newlin, Enobong, MD  Vitamin D , Ergocalciferol , (DRISDOL) 1.25 MG (50000 UNIT) CAPS capsule Take 50,000 Units by mouth once a week. 07/19/23   [provider]  zolpidem  (AMBIEN ) 5 MG tablet Take 1 tablet (5 mg total) by mouth at bedtime as needed for sleep. 06/08/23   Odean Potts, MD  medroxyPROGESTERone  (PROVERA ) 10 MG tablet Take 2 tablets by mouth daily Patient not taking: Reported on 06/05/2019 02/06/18 02/02/20  Nicholaus Burnard HERO, MD    Allergies: Latex and Tramadol     Review of Systems  Cardiovascular:  Positive for chest pain and palpitations.  Gastrointestinal:  Positive for  abdominal pain, diarrhea, nausea and vomiting.  All other systems reviewed and are negative.   Updated Vital Signs BP 115/84   Pulse 85   Temp 98.6 F (37 C) (Oral)   Resp 12   SpO2 98%   Physical Exam Vitals and nursing note reviewed.  Constitutional:      General: She is not in acute distress.    Appearance: She is well-developed. She is not ill-appearing, toxic-appearing or diaphoretic.  HENT:     Head: Normocephalic and atraumatic.  Eyes:     Conjunctiva/sclera: Conjunctivae normal.  Cardiovascular:     Rate and Rhythm: Normal rate and regular rhythm.     Heart sounds: No murmur heard. Pulmonary:     Effort: Pulmonary effort is normal.  No tachypnea or respiratory distress.     Breath sounds: Normal breath sounds. No decreased breath sounds, wheezing, rhonchi or rales.  Chest:     Chest wall: Tenderness present.  Abdominal:     Palpations: Abdomen is soft.     Tenderness: There is abdominal tenderness.  Musculoskeletal:        General: No swelling. Normal range of motion.     Cervical back: Normal range of motion and neck supple.     Right lower leg: No tenderness. No edema.     Left lower leg: No tenderness. No edema.  Skin:    General: Skin is warm and dry.     Capillary Refill: Capillary refill takes less than 2 seconds.     Coloration: Skin is not cyanotic or pale.  Neurological:     General: No focal deficit present.     Mental Status: She is alert and oriented to person, place, and time.  Psychiatric:        Mood and Affect: Mood normal.        Behavior: Behavior normal.     (all labs ordered are listed, but only abnormal results are displayed) Labs Reviewed  BASIC METABOLIC PANEL WITH GFR - Abnormal; Notable for the following components:      Result Value   Glucose, Bld 108 (*)    All other components within normal limits  HEPATIC FUNCTION PANEL - Abnormal; Notable for the following components:   Alkaline Phosphatase 192 (*)    All other components  within normal limits  CBC  HCG, SERUM, QUALITATIVE  LIPASE, BLOOD  MAGNESIUM   TROPONIN I (HIGH SENSITIVITY)  TROPONIN I (HIGH SENSITIVITY)    EKG: EKG Interpretation Date/Time:  Tuesday December 18 2023 13:51:30 EDT Ventricular Rate:  87 PR Interval:  154 QRS Duration:  70 QT Interval:  392 QTC Calculation: 471 R Axis:   48  Text Interpretation: Sinus rhythm with occasional Premature ventricular complexes Low voltage QRS Cannot rule out Anteroseptal infarct , age undetermined Abnormal ECG When compared with ECG of 30-Aug-2023 19:31, PREVIOUS ECG IS PRESENT Confirmed by Levander Houston [phone]) on 12/18/2023 2:41:57 PM  Radiology: CT Angio Chest PE W and/or Wo Contrast Result Date: 12/18/2023 CLINICAL DATA:  Chest and abdominal pain. Concern for pulmonary embolism. Breast cancer. EXAM: CT ANGIOGRAPHY CHEST CT ABDOMEN AND PELVIS WITH CONTRAST TECHNIQUE: Multidetector CT imaging of the chest was performed using the standard protocol during bolus administration of intravenous contrast. Multiplanar CT image reconstructions and MIPs were obtained to evaluate the vascular anatomy. Multidetector CT imaging of the abdomen and pelvis was performed using the standard protocol during bolus administration of intravenous contrast. RADIATION DOSE REDUCTION: This exam was performed according to the departmental dose-optimization program which includes automated exposure control, adjustment of the mA and/or kV according to patient size and/or use of iterative reconstruction technique. CONTRAST:  75mL OMNIPAQUE  IOHEXOL  350 MG/ML SOLN COMPARISON:  Chest CT dated 10/18/2023 and CT abdomen pelvis dated 07/30/2023. FINDINGS: CTA CHEST FINDINGS Cardiovascular: There is no cardiomegaly or pericardial effusion. The thoracic aorta is unremarkable. No pulmonary artery embolus identified. Mediastinum/Nodes: No hilar or mediastinal adenopathy. The esophagus and the thyroid  gland are grossly unremarkable. No mediastinal fluid  collection. Interspersed nodular density in the anterior mediastinum similar to prior CT, likely thymic hyperplasia or rebound. Lungs/Pleura: No focal consolidation, pleural effusion, pneumothorax. Stable pulmonary nodules similar to prior CT and measure up to 5 mm along the minor fissure. No new nodule. The central airways are  patent. Musculoskeletal: No acute osseous pathology. Right breast surgical clips. Review of the MIP images confirms the above findings. CT ABDOMEN and PELVIS FINDINGS No intra-abdominal free air or free fluid. Hepatobiliary: No focal liver abnormality is seen. No gallstones, gallbladder wall thickening, or biliary dilatation. Pancreas: Unremarkable. No pancreatic ductal dilatation or surrounding inflammatory changes. Spleen: Normal in size without focal abnormality. Adrenals/Urinary Tract: The adrenal glands unremarkable. The kidneys, visualized ureters, and urinary bladder appear unremarkable. Stomach/Bowel: There is no bowel obstruction or active inflammation. The appendix is normal. Vascular/Lymphatic: The abdominal aorta and IVC unremarkable. Retroaortic left renal vein anatomy. No portal venous gas. There is no adenopathy. Reproductive: The uterus is anteverted. A small uterine fibroid noted. No suspicious adnexal masses. Other: None Musculoskeletal: No acute or significant osseous findings. Review of the MIP images confirms the above findings. IMPRESSION: 1. No acute intrathoracic, abdominal, or pelvic pathology. No CT evidence of pulmonary embolism. 2. Stable small lung nodules.  No new nodule. 3. No bowel obstruction. Normal appendix. Electronically Signed   By: Vanetta Chou M.D.   On: 12/18/2023 17:23   CT ABDOMEN PELVIS W CONTRAST Result Date: 12/18/2023 CLINICAL DATA:  Chest and abdominal pain. Concern for pulmonary embolism. Breast cancer. EXAM: CT ANGIOGRAPHY CHEST CT ABDOMEN AND PELVIS WITH CONTRAST TECHNIQUE: Multidetector CT imaging of the chest was performed using the  standard protocol during bolus administration of intravenous contrast. Multiplanar CT image reconstructions and MIPs were obtained to evaluate the vascular anatomy. Multidetector CT imaging of the abdomen and pelvis was performed using the standard protocol during bolus administration of intravenous contrast. RADIATION DOSE REDUCTION: This exam was performed according to the departmental dose-optimization program which includes automated exposure control, adjustment of the mA and/or kV according to patient size and/or use of iterative reconstruction technique. CONTRAST:  75mL OMNIPAQUE  IOHEXOL  350 MG/ML SOLN COMPARISON:  Chest CT dated 10/18/2023 and CT abdomen pelvis dated 07/30/2023. FINDINGS: CTA CHEST FINDINGS Cardiovascular: There is no cardiomegaly or pericardial effusion. The thoracic aorta is unremarkable. No pulmonary artery embolus identified. Mediastinum/Nodes: No hilar or mediastinal adenopathy. The esophagus and the thyroid  gland are grossly unremarkable. No mediastinal fluid collection. Interspersed nodular density in the anterior mediastinum similar to prior CT, likely thymic hyperplasia or rebound. Lungs/Pleura: No focal consolidation, pleural effusion, pneumothorax. Stable pulmonary nodules similar to prior CT and measure up to 5 mm along the minor fissure. No new nodule. The central airways are patent. Musculoskeletal: No acute osseous pathology. Right breast surgical clips. Review of the MIP images confirms the above findings. CT ABDOMEN and PELVIS FINDINGS No intra-abdominal free air or free fluid. Hepatobiliary: No focal liver abnormality is seen. No gallstones, gallbladder wall thickening, or biliary dilatation. Pancreas: Unremarkable. No pancreatic ductal dilatation or surrounding inflammatory changes. Spleen: Normal in size without focal abnormality. Adrenals/Urinary Tract: The adrenal glands unremarkable. The kidneys, visualized ureters, and urinary bladder appear unremarkable.  Stomach/Bowel: There is no bowel obstruction or active inflammation. The appendix is normal. Vascular/Lymphatic: The abdominal aorta and IVC unremarkable. Retroaortic left renal vein anatomy. No portal venous gas. There is no adenopathy. Reproductive: The uterus is anteverted. A small uterine fibroid noted. No suspicious adnexal masses. Other: None Musculoskeletal: No acute or significant osseous findings. Review of the MIP images confirms the above findings. IMPRESSION: 1. No acute intrathoracic, abdominal, or pelvic pathology. No CT evidence of pulmonary embolism. 2. Stable small lung nodules.  No new nodule. 3. No bowel obstruction. Normal appendix. Electronically Signed   By: Vanetta Chou HERO.D.  On: 12/18/2023 17:23   DG Chest 2 View Result Date: 12/18/2023 CLINICAL DATA:  chest pain EXAM: CHEST - 2 VIEW COMPARISON:  None available. FINDINGS: No focal airspace consolidation, pleural effusion, or pneumothorax. No cardiomegaly.No acute fracture or destructive lesion. Surgical clips in the right breast. IMPRESSION: No acute cardiopulmonary abnormality. Electronically Signed   By: Rogelia Myers M.D.   On: 12/18/2023 15:29     Procedures   Medications Ordered in the ED  fentaNYL  (SUBLIMAZE ) injection 50 mcg (50 mcg Intravenous Given 12/18/23 1620)  metoCLOPramide  (REGLAN ) injection 10 mg (10 mg Intravenous Given 12/18/23 1619)  alum & mag hydroxide-simeth (MAALOX/MYLANTA) 200-200-20 MG/5ML suspension 30 mL (30 mLs Oral Given 12/18/23 1622)    And  lidocaine  (XYLOCAINE ) 2 % viscous mouth solution 15 mL (15 mLs Oral Given 12/18/23 1622)  famotidine  (PEPCID ) IVPB 20 mg premix (0 mg Intravenous Stopped 12/18/23 1738)  potassium chloride  SA (KLOR-CON  M) CR tablet 40 mEq (40 mEq Oral Given 12/18/23 1739)  iohexol  (OMNIPAQUE ) 350 MG/ML injection 75 mL (75 mLs Intravenous Contrast Given 12/18/23 1704)                                    Medical Decision Making Amount and/or Complexity of Data Reviewed Labs:  ordered. Radiology: ordered.  Risk OTC drugs. Prescription drug management.   This patient presents to the ED for concern of chest pain, this involves an extensive number of treatment options, and is a complaint that carries with it a high risk of complications and morbidity.  The differential diagnosis includes ACS, PE, dissection, GERD, anxiety, pericarditis, pneumonia, pancreatitis, PUD   Co morbidities / Chronic conditions that complicate the patient evaluation  fibroids, GERD, depression, anxiety, breast cancer, arthritis   Additional history obtained:  Additional history obtained from EMR External records from outside source obtained and reviewed including N/A   Lab Tests:  I Ordered, and personally interpreted labs.  The pertinent results include: Normal hemoglobin, no leukocytosis, normal kidney function, normal electrolytes, normal hepatobiliary enzymes, normal lipase, normal troponins x 2   Imaging Studies ordered:  I ordered imaging studies including chest x-ray, CTA chest, CT of abdomen and pelvis I independently visualized and interpreted imaging which showed no acute findings I agree with the radiologist interpretation   Cardiac Monitoring: / EKG:  The patient was maintained on a cardiac monitor.  I personally viewed and interpreted the cardiac monitored which showed an underlying rhythm of: Sinus rhythm with occasional PVCs   Problem List / ED Course / Critical interventions / Medication management  Patient presenting for acute onset of chest pain, nausea, vomiting, diarrhea, abdominal pain, palpitations.  On exam, patient is well-appearing.  She states that most of her symptoms have improved.  She does continue to have 7/10 severity chest pain in addition to some upper abdominal pain.  She has tenderness present to these areas.  Current breathing is unlabored.  Vital signs are normal.  She did receive ASA, NTG, and Zofran  prior to arrival.  Workup was  initiated.  Serum lab work was reassuring.  No acute findings were identified on imaging.  Patient did have improved symptoms after GI cocktail.  I suspect source of her symptoms was GERD/PUD.  She is followed by gastroenterologist.  She does have a cardiology appointment upcoming next month.  She does take daily Nexium .  She was advised to take over-the-counter Mylanta as needed.  Prescription for Reglan   was provided.  Patient was discharged in stable condition. I ordered medication including fentanyl  and GI cocktail for analgesia; Reglan  for nausea; potassium chloride  for electrolyte optimization Reevaluation of the patient after these medicines showed that the patient improved I have reviewed the patients home medicines and have made adjustments as needed  Social Determinants of Health:  Lives independently     Final diagnoses:  Chest pain, unspecified type    ED Discharge Orders          Ordered    metoCLOPramide  (REGLAN ) 10 MG tablet  Every 6 hours        12/18/23 1741               Melvenia Motto, MD 12/18/23 [phone]

## 2023-12-18 NOTE — Patient Outreach (Signed)
 Complex Care Management   Visit Note  12/13/2023  Name:  Laurie Bell MRN: [id number] DOB: 1980-09-27  Situation: Referral received for Complex Care Management related to Mental/Behavioral Health diagnosis Depression/Anxiety I obtained verbal consent from Patient.  Visit completed with pt  on the phone  Background:   Past Medical History:  Diagnosis Date   Allergy    Anxiety    Arthritis    Breast cancer (HCC)    Chlamydia contact, treated    GERD (gastroesophageal reflux disease)    Hyperthyroidism     Assessment: Patient Reported Symptoms:  Cognitive        Neurological      HEENT        Cardiovascular      Respiratory      Endocrine Endocrine Symptoms Reported: Not assessed    Gastrointestinal Gastrointestinal Symptoms Reported: Not assessed      Genitourinary Genitourinary Symptoms Reported: Not assessed    Integumentary Integumentary Symptoms Reported: Not assessed    Musculoskeletal Musculoskelatal Symptoms Reviewed: Not assessed        Psychosocial Psychosocial Symptoms Reported: Anxiety - if selected complete GAD Additional Psychological Details: Increase in anxiety endorsed by pt due to upcoming oncology appt. Pt has identified healthy coping skills to assist with symptom management/grounding Behavioral Management Strategies: Adequate rest, Support system, Coping strategies Major Change/Loss/Stressor/Fears (CP): Medical condition, self Techniques to Cope with Loss/Stress/Change: Diversional activities Quality of Family Relationships: involved      10/17/2023    3:43 PM  Depression screen PHQ 2/9  Decreased Interest 1  Down, Depressed, Hopeless 0  PHQ - 2 Score 1  Altered sleeping 2  Tired, decreased energy 2  Change in appetite 0  Feeling bad or failure about yourself  0  Trouble concentrating 1  Moving slowly or fidgety/restless 0  Suicidal thoughts 0  PHQ-9 Score 6  Difficult doing work/chores Somewhat difficult    There were no  vitals filed for this visit.  Medications Reviewed Today     Reviewed by Ezzard Rolin BIRCH, LCSW (Social Worker) on 12/13/23 at 1333  Med List Status: <None>   Medication Order Taking? Sig Documenting Provider Last Dose Status Informant  0.9 %  sodium chloride  infusion [id number]   Dorsey, Ying C, MD  Active   0.9 %  sodium chloride  infusion [id number]   Federico Rosario BROCKS, MD  Active   acetaminophen  (TYLENOL ) 500 MG tablet [id number] Yes Take 1,000 mg by mouth every 6 (six) hours as needed for moderate pain. [provider]  Active Self, Pharmacy Records  albuterol  (VENTOLIN  HFA) 108 (90 Base) MCG/ACT inhaler [id number]  TAKE 2 PUFFS BY MOUTH EVERY 6 HOURS AS NEEDED FOR WHEEZE OR SHORTNESS OF BREATH  Patient taking differently: Inhale 2 puffs into the lungs every 6 (six) hours as needed for wheezing or shortness of breath.   Newlin, Enobong, MD  Active Self, Pharmacy Records  AMBULATORY J[facility] MEDICATION [id number]  Medication Name: GI Cocktail: 270 mL viscous Xylocaine  2%, 270 mL Dicyclomine 10 mg/5 mL, 810 mL Mylanta/Maylox. Take 8-10 mL every 4-6 hours as needed for severe pain Dispense 900 mL Federico Rosario BROCKS, MD  Active   clindamycin  (CLEOCIN ) 300 MG capsule [id number] Yes Take 1 capsule (300 mg total) by mouth 2 (two) times daily. Newlin, Enobong, MD  Active   cyclobenzaprine  (FLEXERIL ) 10 MG tablet [id number] Yes Take 1 tablet (10 mg total) by mouth 2 (two) times daily as needed for muscle spasms. TAKE 1 TABLET BY MOUTH  AT BEDTIME AS NEEDED FOR MUSCLE SPASMS Newlin, Enobong, MD  Active   esomeprazole  (NEXIUM ) 40 MG capsule [id number] Yes Take 1 capsule (40 mg total) by mouth 2 (two) times daily before a meal. Federico Rosario BROCKS, MD  Active   famotidine  (PEPCID ) 40 MG tablet [id number] Yes Take 1 tablet (40 mg total) by mouth at bedtime. Mollie Pfeiffer M, PA-C  Active   fluticasone  (FLONASE ) 50 MCG/ACT nasal spray [id number] Yes Place 2 sprays into both nostrils daily. Danton Slough M, PA-C   Active   gabapentin  (NEURONTIN ) 400 MG capsule [id number] Yes TAKE 1 CAPSULE BY MOUTH IN AM AND NOON, AND 2 CAPSULE BY MOUTH AT BEDTIME Gudena, Vinay, MD  Active   hydrOXYzine  (ATARAX ) 25 MG tablet [id number] Yes TAKE 1 TABLET(25 MG) BY MOUTH THREE TIMES DAILY Newlin, Enobong, MD  Active   ipratropium (ATROVENT ) 0.03 % nasal spray [id number] Yes Place into the nose. [provider]  Active   linaclotide  (LINZESS ) 145 MCG CAPS capsule [id number]  Take 1 capsule (145 mcg total) by mouth daily before breakfast.  Patient taking differently: Take 145 mcg by mouth as needed.   Federico Rosario BROCKS, MD  Active Self, Pharmacy Records   Patient not taking:   Discontinued 02/02/20 1436 methimazole  (TAPAZOLE ) 5 MG tablet [id number] Yes Take 1 tablet (5 mg total) by mouth as directed. 1 tablet Monday through Thursday, skip rest of the week Shamleffer, Ibtehal Jaralla, MD  Active   metoprolol  succinate (TOPROL -XL) 25 MG 24 hr tablet [id number] Yes Take 0.5 tablets (12.5 mg total) by mouth daily. For increased heart rate Delbert Clam, MD  Active   naloxone (NARCAN) nasal spray 4 mg/0.1 mL [id number] Yes 1 (ONE) SPRAY AS NEEDED [provider]  Active   naproxen  (NAPROSYN ) 500 MG tablet [id number] Yes TAKE 1 TABLET BY MOUTH TWICE A DAY AS NEEDED FOR PAIN Delbert Clam, MD  Active Self, Pharmacy Records  oxyCODONE -acetaminophen  (PERCOCET/ROXICET) 5-325 MG tablet [id number] Yes Take 1 tablet by mouth 4 (four) times daily. [provider]  Active Self, Pharmacy Records  potassium chloride  (KLOR-CON ) 10 MEQ tablet [id number] Yes Take by mouth. [provider]  Active   rizatriptan  (MAXALT ) 10 MG tablet [id number] Yes Take 1 tablet (10 mg total) by mouth as needed for migraine. May repeat in 2 hours if needed Newlin, Enobong, MD  Active   sucralfate  (CARAFATE ) 1 GM/10ML suspension [id number] Yes Take 10 mLs (1 g total) by mouth 4 (four) times daily. Take 2 teaspoons with each meal and 2 teaspoons  at bedtime. Do not take within 2 hours of any other medications Kennedy-Smith, Colleen M, NP  Active   sucralfate  (CARAFATE ) 1 GM/10ML suspension [id number]  Take 10 mLs (1 g total) by mouth 4 (four) times daily. Federico Rosario BROCKS, MD  Expired 10/17/23 2359   topiramate  (TOPAMAX ) 50 MG tablet [id number] Yes Take 1 tablet (50 mg total) by mouth 2 (two) times daily. Newlin, Enobong, MD  Active   venlafaxine  XR (EFFEXOR  XR) 75 MG 24 hr capsule [id number] Yes Take 1 capsule (75 mg total) by mouth daily with breakfast. Delbert Clam, MD  Active   Vitamin D , Ergocalciferol , (DRISDOL) 1.25 MG (50000 UNIT) CAPS capsule [id number] Yes Take 50,000 Units by mouth once a week. [provider]  Active Self, Pharmacy Records  zolpidem  (AMBIEN ) 5 MG tablet [id number] Yes Take 1 tablet (5 mg total) by mouth at bedtime as needed for sleep. Gudena, Vinay, MD  Active Self, Pharmacy Records  Recommendation:   Continue Current Plan of Care  Follow Up Plan:   Telephone follow-up 2-4 weeks  Rolin Kerns, LCSW [place]  [facility], [facility] Clinical Social Worker Direct Dial: [phone]  Fax: [phone] Website: [email or web address] 11:13 AM

## 2023-12-18 NOTE — ED Notes (Signed)
 Patient transported to CT

## 2023-12-18 NOTE — Discharge Instructions (Signed)
 Your test results today were reassuring.  Continue your daily esomeprazole .  Take over-the-counter Maalox or Mylanta as needed for symptoms of reflux.  A prescription for medication called metoclopramide  was sent to your pharmacy.  Take this as needed for nausea.  Keep your follow-up cardiology appointment.  Return to the emergency department for any new or worsening symptoms of concern.

## 2023-12-18 NOTE — ED Triage Notes (Signed)
 Pt is here with chest pain that started today while she was working and has radiation to left arm. Pt has irregular heart beat.  Pt reports lower abdominal pain and reports diarrhea. Pt received 1 ntg, 324mg  asa, 18 g LAC and received  100NS per ems.  Initally had bigeminy PVCs and then cleared up to be around a rate of 80.  Pt rates pain 10/10

## 2023-12-18 NOTE — ED Provider Triage Note (Signed)
 Emergency Medicine Provider Triage Evaluation Note  Laurie Bell , a 43 y.o. female  was evaluated in triage.  Pt complains of chest pain, nausea, vomiting, diarrhea.  Patient states she was at work this morning around 930 when she began having some sharp anterior chest pain.  It began more to the left and then radiated over to the right of her chest.  She then had some abdominal pain and cramping.  She had associated nausea, vomiting episode of diarrhea.  She states that she had breast cancer and has completed treatment in February of this year..  Review of Systems  Positive: Chest pain, abdominal pain Negative: Fever, chills  Physical Exam  BP 119/89 (BP Location: Left Arm)   Pulse 87   Temp 98.6 F (37 C) (Oral)   Resp 18   SpO2 100%  Gen:   Awake, no distress   Resp:  Normal effort  MSK:   Moves extremities without difficulty  Other:  Regular rate and rhythm blood pressure is 119/89  Medical Decision Making  Medically screening exam initiated at 2:42 PM.  Appropriate orders placed.  [name] was informed that the remainder of the evaluation will be completed by another provider, this initial triage assessment does not replace that evaluation, and the importance of remaining in the ED until their evaluation is complete.  She reports ongoing pain at 8 out of 10 right side of her chest EKG reviewed without any evidence of acute ischemia Will add LFTs and lipase   Levander Houston, MD 12/18/23 1446

## 2023-12-20 ENCOUNTER — Other Ambulatory Visit: Payer: Self-pay | Admitting: Hematology and Oncology

## 2023-12-22 ENCOUNTER — Other Ambulatory Visit: Payer: Self-pay | Admitting: Hematology and Oncology

## 2023-12-24 ENCOUNTER — Ambulatory Visit
Admission: RE | Admit: 2023-12-24 | Discharge: 2023-12-24 | Disposition: A | Discharge status: Home / Self Care | Source: Ambulatory Visit | Attending: Adult Health | Admitting: Adult Health

## 2023-12-24 DIAGNOSIS — Z171 Estrogen receptor negative status [ER-]: Secondary | ICD-10-CM

## 2023-12-24 DIAGNOSIS — Z9189 Other specified personal risk factors, not elsewhere classified: Secondary | ICD-10-CM

## 2023-12-24 HISTORY — DX: Personal history of irradiation: Z92.3

## 2023-12-24 HISTORY — DX: Personal history of antineoplastic chemotherapy: Z92.21

## 2023-12-25 ENCOUNTER — Other Ambulatory Visit: Payer: Self-pay | Admitting: Hematology and Oncology

## 2023-12-25 ENCOUNTER — Encounter: Payer: Self-pay | Admitting: Hematology and Oncology

## 2023-12-27 ENCOUNTER — Telehealth: Payer: Self-pay | Admitting: Hematology and Oncology

## 2023-12-27 NOTE — Telephone Encounter (Signed)
 Couldn't  leave voice mail Reschedule appointment per provider pal.  Called left VM with changes made to the upcoming appointment.

## 2024-01-07 ENCOUNTER — Telehealth: Payer: Self-pay | Admitting: *Deleted

## 2024-01-07 ENCOUNTER — Ambulatory Visit: Admitting: Internal Medicine

## 2024-01-07 ENCOUNTER — Telehealth: Payer: Self-pay | Admitting: Internal Medicine

## 2024-01-07 NOTE — Telephone Encounter (Signed)
 Nat has been rescheduled for 8/11. I LVM with appointment details.

## 2024-01-07 NOTE — Telephone Encounter (Signed)
 PC to patient regarding her missed appointment today, she states she forgot.  Informed patient our scheduling department will contact her to reschedule, she verbalizes understanding.  Scheduling message sent.

## 2024-01-18 ENCOUNTER — Other Ambulatory Visit: Payer: Self-pay | Admitting: Gastroenterology

## 2024-01-18 ENCOUNTER — Telehealth: Payer: Self-pay | Admitting: Family Medicine

## 2024-01-18 NOTE — Telephone Encounter (Signed)
 Pt confirmed appt 8/8

## 2024-01-21 ENCOUNTER — Inpatient Hospital Stay: Admitting: Internal Medicine

## 2024-01-21 ENCOUNTER — Ambulatory Visit: Admitting: Family Medicine

## 2024-01-21 ENCOUNTER — Other Ambulatory Visit (HOSPITAL_COMMUNITY): Payer: Self-pay

## 2024-01-21 ENCOUNTER — Telehealth: Payer: Self-pay | Admitting: *Deleted

## 2024-01-21 NOTE — Telephone Encounter (Signed)
 PC to patient regarding missed appointment today, she thought it was tomorrow.  Patient transferred to scheduling to reschedule.

## 2024-01-22 ENCOUNTER — Ambulatory Visit: Admitting: Internal Medicine

## 2024-01-22 ENCOUNTER — Other Ambulatory Visit (HOSPITAL_COMMUNITY): Payer: Self-pay

## 2024-01-22 NOTE — Progress Notes (Signed)
 Subjective: Patient ID:  Laurie Bell is a 43 y.o. female  Chief Complaint  Patient presents with  . Cough    Pt c/o cough, chills, runny nose. Exposed to covid. Symptoms started yesterday. Pt has been taking Tylenol  sinus    The following information was reviewed by members of the visit team:  Tobacco  Allergies  Meds  Problems  Med Hx  Surg Hx  OB Status   Fam Hx    43 year old female presents for evaluation of symptoms that started yesterday.  She reports her grandson was diagnosed with covid 1 week ago.  She complains of fever, chills, sweating, cough that is productive with yellow sputum, body aches and runny nose.  She denies any chest pain or sob.  She has been taking tylenol  sinus, and states the symptoms started suddenly. She has a hx of breast cancer diagnosed about 1 year ago. She does not appear to be in distress during exam.    Cough Associated symptoms include chills, a fever and rhinorrhea. Pertinent negatives include no chest pain, headaches, sore throat or shortness of breath.     Review of Systems  Constitutional:  Positive for chills, diaphoresis and fever.  HENT:  Positive for rhinorrhea. Negative for sore throat.   Respiratory:  Positive for cough. Negative for shortness of breath.   Cardiovascular:  Negative for chest pain.  Gastrointestinal:  Negative for abdominal pain, diarrhea, nausea and vomiting.  Genitourinary:  Negative for dysuria.  Musculoskeletal:  Positive for arthralgias.  Neurological:  Negative for dizziness and headaches.      Objective  Vitals:   01/22/24 1747  BP: (!) 117/90  Pulse: 81  Resp: 18  Temp: 97.5 F (36.4 C)  TempSrc: Tympanic  SpO2: 100%  Weight: 89.4 kg (197 lb)    No LMP recorded. (Menstrual status: Chemotherapy/radiation).   Behavioral Health Screening  Patient Health Questionnaire-2 Score: 0 (01/22/2024  5:45 PM)      Patient's Depression screening is Negative   Depression Plan:  Normal/Negative Screening  Physical Exam Vitals and nursing note reviewed.  Constitutional:      Appearance: Normal appearance. She is normal weight.  HENT:     Nose: Nose normal.     Mouth/Throat:     Mouth: Mucous membranes are moist.   Cardiovascular:     Rate and Rhythm: Normal rate. Rhythm irregular.     Heart sounds: Normal heart sounds.  Pulmonary:     Effort: Pulmonary effort is normal.     Breath sounds: Decreased breath sounds present. No wheezing, rhonchi or rales.   Musculoskeletal:     Cervical back: Normal range of motion and neck supple.  Lymphadenopathy:     Cervical: No cervical adenopathy.   Skin:    General: Skin is warm and dry.     Capillary Refill: Capillary refill takes 2 to 3 seconds.   Neurological:     General: No focal deficit present.     Mental Status: She is alert and oriented to person, place, and time.   Psychiatric:        Mood and Affect: Mood normal.        Behavior: Behavior normal.      Recent Results (from the past 24 hours)  POC SARS-COV-2 SYMPTOMATIC (IDNOW)   Collection Time: 01/22/24  6:08 PM  Result Value Ref Range   SARS-COV-2 IDNOW Negative Negative   SARS IDNOW Information      A rapid, molecular diagnostic test on  the IDNOW.  Negative results should be treated as presumptive and, if inconsistent with clinical symptoms should be confirmed with an alternative molecular assay.     Radiologist interpretation:   No orders to display     Assessment/Plan   Laurie Bell was seen today for cough.  Diagnoses and all orders for this visit:  Fever, unspecified fever cause -     POC SARS-COV-2 SYMPTOMATIC (IDNOW)  Cough, unspecified type -     POC SARS-COV-2 SYMPTOMATIC (IDNOW) -     dexAMETHasone  (DECADRON ) 6 mg tablet; Take 1 tablet (6 mg total) by mouth daily with breakfast for 5 days. -     albuterol  HFA (PROVENTIL  HFA;VENTOLIN  HFA;PROAIR  HFA) 90 mcg/actuation inhaler; Inhale 2 puffs every 4 (four) hours as needed for  wheezing (frequent cough).  Viral syndrome -     promethazine -dextromethorphan (PHENERGAN  DM) 6.25-15 mg/5 mL syrp syrup; Take 5 mL by mouth every 4 (four) hours.    Patient has been instructed on RX/ OTC medications, dosages, side effects, and possible interactions as associated with each diagnosis in my impression and plan above.  2.   Patient education (verbal/handout) given on diagnosis, pathophysiology, treatment of diagnosis, side effects of medication use for treatment, restrictions while taking medication.  Supportive       Care measures as directed on AVS.  Red Flags associated with diagnosis/es were reviewed and patient instructed on action plan if red flags develop.  3.   Urgent Care Disposition:  Follow up with PCP       They have been instructed that if symptoms worse that should go to Urgent Care, go to the nearest ED, or activate EMS.  4.   Patient agreed with plan and voiced understanding.  NO barriers to adherence perceived by myself.  Electronically signed: Rosaline Jama Collet FNP  Tue 01/22/2024 6:30 PM

## 2024-01-23 ENCOUNTER — Encounter: Payer: Self-pay | Admitting: Family Medicine

## 2024-01-24 ENCOUNTER — Other Ambulatory Visit: Payer: Self-pay | Admitting: Family Medicine

## 2024-01-24 MED ORDER — POTASSIUM CHLORIDE ER 10 MEQ PO TBCR: 10.0000 meq | EXTENDED_RELEASE_TABLET | Freq: Every day | ORAL | 1 refills | Status: DC

## 2024-01-24 NOTE — Telephone Encounter (Signed)
 Patient has 6 month follow up scheduled for December. Potassium medication has not been filled by you. She is requesting refill.

## 2024-01-28 ENCOUNTER — Telehealth: Payer: Self-pay | Admitting: Internal Medicine

## 2024-01-28 ENCOUNTER — Inpatient Hospital Stay: Admitting: Internal Medicine

## 2024-01-28 NOTE — Telephone Encounter (Signed)
 Rescheduled appointment per 8/18 staff message. Talked with the patient and she is aware of the made appointment. Informed the patient to call our office if she needs to reschedule.

## 2024-01-30 ENCOUNTER — Emergency Department (HOSPITAL_BASED_OUTPATIENT_CLINIC_OR_DEPARTMENT_OTHER): Admitting: Radiology

## 2024-01-30 ENCOUNTER — Emergency Department (HOSPITAL_BASED_OUTPATIENT_CLINIC_OR_DEPARTMENT_OTHER)
Admission: EM | Admit: 2024-01-30 | Discharge: 2024-01-30 | Disposition: A | Discharge status: Home / Self Care | Attending: Emergency Medicine | Admitting: Emergency Medicine

## 2024-01-30 ENCOUNTER — Encounter (HOSPITAL_BASED_OUTPATIENT_CLINIC_OR_DEPARTMENT_OTHER): Payer: Self-pay

## 2024-01-30 DIAGNOSIS — Z91040 Latex allergy status: Secondary | ICD-10-CM | POA: Diagnosis not present

## 2024-01-30 DIAGNOSIS — K273 Acute peptic ulcer, site unspecified, without hemorrhage or perforation: Secondary | ICD-10-CM | POA: Diagnosis not present

## 2024-01-30 DIAGNOSIS — R0789 Other chest pain: Secondary | ICD-10-CM | POA: Diagnosis present

## 2024-01-30 DIAGNOSIS — K279 Peptic ulcer, site unspecified, unspecified as acute or chronic, without hemorrhage or perforation: Secondary | ICD-10-CM

## 2024-01-30 DIAGNOSIS — R079 Chest pain, unspecified: Secondary | ICD-10-CM

## 2024-01-30 LAB — CBC
HCT: 37.2 % (ref 36.0–46.0)
Hemoglobin: 12.5 g/dL (ref 12.0–15.0)
MCH: 27 pg (ref 26.0–34.0)
MCHC: 33.6 g/dL (ref 30.0–36.0)
MCV: 80.3 fL (ref 80.0–100.0)
Platelets: 253 K/uL (ref 150–400)
RBC: 4.63 MIL/uL (ref 3.87–5.11)
RDW: 14.6 % (ref 11.5–15.5)
WBC: 8.3 K/uL (ref 4.0–10.5)
nRBC: 0 % (ref 0.0–0.2)

## 2024-01-30 LAB — BASIC METABOLIC PANEL WITH GFR
Anion gap: 11 (ref 5–15)
BUN: 10 mg/dL (ref 6–20)
CO2: 21 mmol/L — ABNORMAL LOW (ref 22–32)
Calcium: 9.2 mg/dL (ref 8.9–10.3)
Chloride: 106 mmol/L (ref 98–111)
Creatinine, Ser: 0.88 mg/dL (ref 0.44–1.00)
GFR, Estimated: >60 mL/min (ref >60)
Glucose, Bld: 93 mg/dL (ref 70–99)
Potassium: 3.3 mmol/L — ABNORMAL LOW (ref 3.5–5.1)
Sodium: 138 mmol/L (ref 135–145)

## 2024-01-30 LAB — PREGNANCY, URINE: Preg Test, Ur: NEGATIVE

## 2024-01-30 LAB — TROPONIN T, HIGH SENSITIVITY: Troponin T High Sensitivity: <15 ng/L (ref 0–19)

## 2024-01-30 LAB — D-DIMER, QUANTITATIVE: D-Dimer, Quant: <0.27 ug{FEU}/mL (ref 0.00–0.50)

## 2024-01-30 MED ORDER — ALUM & MAG HYDROXIDE-SIMETH 200-200-20 MG/5ML PO SUSP
30.0000 mL | Freq: Once | ORAL | Status: AC
  Administered 2024-01-30: 30 mL via ORAL
  Filled 2024-01-30: qty 30

## 2024-01-30 MED ORDER — LIDOCAINE VISCOUS HCL 2 % MT SOLN
15.0000 mL | Freq: Once | OROMUCOSAL | Status: AC
  Administered 2024-01-30: 15 mL via ORAL
  Filled 2024-01-30: qty 15

## 2024-01-30 MED ORDER — POTASSIUM CHLORIDE CRYS ER 20 MEQ PO TBCR
40.0000 meq | EXTENDED_RELEASE_TABLET | Freq: Once | ORAL | Status: AC
  Administered 2024-01-30: 40 meq via ORAL
  Filled 2024-01-30: qty 2

## 2024-01-30 NOTE — ED Triage Notes (Signed)
 Patient reports chest pain and shortness of breath as well as some abdominal starting a couple days. Has hx of cancer, patient also reports having acid reflux. She is worried about a blood clot but has no history of such.

## 2024-01-30 NOTE — Discharge Instructions (Signed)
 You were seen for your chest pain in the emergency department.  There are no signs of a heart attack or a blood clot at this time.  At home, please take Tylenol  for your pain as well as your antacids.  Stay away from ibuprofen  because it can make your heartburn worse.    Follow-up with your primary doctor in 2-3 days regarding your visit.    Return immediately to the emergency department if you experience any of the following: Worsening pain, difficulty breathing, unexplained vomiting or sweating, or any other concerning symptoms.    Thank you for visiting our Emergency Department. It was a pleasure taking care of you today.

## 2024-01-30 NOTE — ED Provider Notes (Signed)
 [place] EMERGENCY DEPARTMENT AT [facility] Provider Note   CSN: [id number] Arrival date & time: 01/30/24  8084     Patient presents with: Chest Pain, Shortness of Breath, and Abdominal Pain   Laurie Bell is a 43 y.o. female.   43 year old female with a history of peptic ulcer disease and breast cancer currently undergoing chemotherapy presents emergency department chest discomfort.  Patient reports that 2 days ago she started feeling chest pain.  Substernal.  It is sharp, burning, and pressure-like.  Exertional.  Nonpleuritic.  No diaphoresis or vomiting.  Has had a cough and shortness of breath reports she is getting over a cold that she had recently.  No leg swelling.  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  Was trying her home medications for her gastritis and peptic ulcer disease but they were not working so she decided to come into the emergency department for evaluation       Prior to Admission medications   Medication Sig Start Date End Date Taking? Authorizing Provider  acetaminophen  (TYLENOL ) 500 MG tablet Take 1,000 mg by mouth every 6 (six) hours as needed for moderate pain.    [provider]  albuterol  (VENTOLIN  HFA) 108 (90 Base) MCG/ACT inhaler TAKE 2 PUFFS BY MOUTH EVERY 6 HOURS AS NEEDED FOR WHEEZE OR SHORTNESS OF BREATH Patient taking differently: Inhale 2 puffs into the lungs every 6 (six) hours as needed for wheezing or shortness of breath. 12/22/22   Newlin, Enobong, MD  AMBULATORY NON FORMULARY MEDICATION Medication Name: GI Cocktail: 270 mL viscous Xylocaine  2%, 270 mL Dicyclomine 10 mg/5 mL, 810 mL Mylanta/Maylox. Take 8-10 mL every 4-6 hours as needed for severe pain Dispense 900 mL 11/19/23   Federico Rosario BROCKS, MD  clindamycin  (CLEOCIN ) 300 MG capsule Take 1 capsule (300 mg total) by mouth 2 (two) times daily. 11/20/23   Newlin, Enobong, MD  cyclobenzaprine  (FLEXERIL ) 10 MG tablet Take 1 tablet (10 mg total) by  mouth 2 (two) times daily as needed for muscle spasms. TAKE 1 TABLET BY MOUTH AT BEDTIME AS NEEDED FOR MUSCLE SPASMS 11/20/23   Newlin, Enobong, MD  esomeprazole  (NEXIUM ) 40 MG capsule Take 1 capsule (40 mg total) by mouth 2 (two) times daily before a meal. 12/03/23   Federico Rosario BROCKS, MD  famotidine  (PEPCID ) 40 MG tablet Take 1 tablet (40 mg total) by mouth at bedtime. 08/31/23   McMichael, Nestor HERO, PA-C  fluticasone  (FLONASE ) 50 MCG/ACT nasal spray Place 2 sprays into both nostrils daily. 10/17/23   Danton Jon HERO, PA-C  gabapentin  (NEURONTIN ) 400 MG capsule TAKE 1 CAPSULE BY MOUTH IN AM AND NOON, AND 2 CAPSULE BY MOUTH AT BEDTIME 12/21/23   Odean Potts, MD  hydrOXYzine  (ATARAX ) 25 MG tablet TAKE 1 TABLET(25 MG) BY MOUTH THREE TIMES DAILY 11/20/23   Newlin, Enobong, MD  ipratropium (ATROVENT ) 0.03 % nasal spray Place into the nose. 12/20/22   [provider]  linaclotide  (LINZESS ) 145 MCG CAPS capsule Take 1 capsule (145 mcg total) by mouth daily before breakfast. Patient taking differently: Take 145 mcg by mouth as needed. 05/19/22   Federico Rosario BROCKS, MD  methimazole  (TAPAZOLE ) 5 MG tablet Take 1 tablet (5 mg total) by mouth as directed. 1 tablet Monday through Thursday, skip rest of the week 10/02/23   Shamleffer, Ibtehal Jaralla, MD  metoCLOPramide  (REGLAN ) 10 MG tablet Take 1 tablet (10 mg total) by mouth every 6 (six) hours. 12/18/23  Melvenia Motto, MD  metoprolol  succinate (TOPROL -XL) 25 MG 24 hr tablet Take 0.5 tablets (12.5 mg total) by mouth daily. For increased heart rate 11/20/23   Newlin, Enobong, MD  naloxone [facility]) nasal spray 4 mg/0.1 mL 1 (ONE) SPRAY AS NEEDED 07/13/23   [provider]  naproxen  (NAPROSYN ) 500 MG tablet TAKE 1 TABLET BY MOUTH TWICE A DAY AS NEEDED FOR PAIN 03/19/23   Delbert Clam, MD  oxyCODONE -acetaminophen  (PERCOCET/ROXICET) 5-325 MG tablet Take 1 tablet by mouth 4 (four) times daily. 07/13/23   [provider]  potassium chloride  (KLOR-CON ) 10  MEQ tablet Take 1 tablet (10 mEq total) by mouth daily. 01/24/24   Newlin, Enobong, MD  rizatriptan  (MAXALT ) 10 MG tablet Take 1 tablet (10 mg total) by mouth as needed for migraine. May repeat in 2 hours if needed 11/20/23   Newlin, Enobong, MD  sucralfate  (CARAFATE ) 1 GM/10ML suspension Take 10 mLs (1 g total) by mouth 4 (four) times daily. Take 2 teaspoons with each meal and 2 teaspoons at bedtime. Do not take within 2 hours of any other medications 08/09/23   Cara Elida HERO, NP  sucralfate  (CARAFATE ) 1 GM/10ML suspension Take 10 mLs (1 g total) by mouth 4 (four) times daily. 09/17/23 10/17/23  Federico Rosario BROCKS, MD  topiramate  (TOPAMAX ) 50 MG tablet Take 1 tablet (50 mg total) by mouth 2 (two) times daily. 11/20/23   Newlin, Enobong, MD  venlafaxine  XR (EFFEXOR  XR) 75 MG 24 hr capsule Take 1 capsule (75 mg total) by mouth daily with breakfast. 11/20/23   Delbert Clam, MD  Vitamin D , Ergocalciferol , (DRISDOL) 1.25 MG (50000 UNIT) CAPS capsule Take 50,000 Units by mouth once a week. 07/19/23   [provider]  zolpidem  (AMBIEN ) 5 MG tablet TAKE 1 TABLET BY MOUTH AT BEDTIME AS NEEDED FOR SLEEP. 12/25/23   Odean Potts, MD  medroxyPROGESTERone  (PROVERA ) 10 MG tablet Take 2 tablets by mouth daily Patient not taking: Reported on 06/05/2019 02/06/18 02/02/20  Nicholaus Burnard HERO, MD    Allergies: Latex and Tramadol     Review of Systems  Updated Vital Signs BP 128/74   Pulse 84   Temp 97.9 F (36.6 C)   Resp 18   SpO2 100%   Physical Exam Vitals and nursing note reviewed.  Constitutional:      General: She is not in acute distress.    Appearance: She is well-developed.  HENT:     Head: Normocephalic and atraumatic.     Right Ear: External ear normal.     Left Ear: External ear normal.     Nose: Nose normal.  Eyes:     Extraocular Movements: Extraocular movements intact.     Conjunctiva/sclera: Conjunctivae normal.     Pupils: Pupils are equal, round, and reactive to light.   Cardiovascular:     Rate and Rhythm: Normal rate and regular rhythm.     Heart sounds: No murmur heard.    Comments: Radial pulses 2+ bilaterally.  Chest pain not reproducible.  No overlying rash Pulmonary:     Effort: Pulmonary effort is normal. No respiratory distress.     Breath sounds: Normal breath sounds.  Musculoskeletal:     Cervical back: Normal range of motion and neck supple.     Right lower leg: No edema.     Left lower leg: No edema.  Skin:    General: Skin is warm and dry.  Neurological:     Mental Status: She is alert and oriented to person,  place, and time. Mental status is at baseline.  Psychiatric:        Mood and Affect: Mood normal.     (all labs ordered are listed, but only abnormal results are displayed) Labs Reviewed  BASIC METABOLIC PANEL WITH GFR - Abnormal; Notable for the following components:      Result Value   Potassium 3.3 (*)    CO2 21 (*)    All other components within normal limits  CBC  PREGNANCY, URINE  D-DIMER, QUANTITATIVE  TROPONIN T, HIGH SENSITIVITY    EKG: EKG Interpretation Date/Time:  Wednesday January 30 2024 19:25:06 EDT Ventricular Rate:  96 PR Interval:  160 QRS Duration:  72 QT Interval:  376 QTC Calculation: 475 R Axis:   41  Text Interpretation: Normal sinus rhythm Low voltage QRS Cannot rule out Anteroseptal infarct (cited on or before 18-Dec-2023) Abnormal ECG When compared with ECG of 18-Dec-2023 13:51, Premature ventricular complexes are no longer Present Confirmed by Yolande Charleston [phone]) on 01/30/2024 8:32:16 PM  Radiology: DG Chest 2 View Result Date: 01/30/2024 CLINICAL DATA:  Chest pain EXAM: CHEST - 2 VIEW COMPARISON:  12/18/2023, chest CT 12/18/2023 FINDINGS: The heart size and mediastinal contours are within normal limits. Both lungs are clear. The visualized skeletal structures are unremarkable. Clips in the right breast. IMPRESSION: No active cardiopulmonary disease. Electronically Signed   By: Luke Bun M.D.   On: 01/30/2024 20:02     Procedures   Medications Ordered in the ED  potassium chloride  SA (KLOR-CON  M) CR tablet 40 mEq (40 mEq Oral Given 01/30/24 2048)  alum & mag hydroxide-simeth (MAALOX/MYLANTA) 200-200-20 MG/5ML suspension 30 mL (30 mLs Oral Given 01/30/24 2048)    And  lidocaine  (XYLOCAINE ) 2 % viscous mouth solution 15 mL (15 mLs Oral Given 01/30/24 2048)                                    Medical Decision Making Amount and/or Complexity of Data Reviewed Labs: ordered. Radiology: ordered.  Risk OTC drugs. Prescription drug management.   43 year old female with a history of breast cancer and peptic ulcer disease who presents emergency department chest discomfort  Initial Ddx:  PUD, gastritis, MI, PE, pericarditis, malignant effusion  MDM/Course:  Patient presents emergency department chest pain.  Also is having some shortness of breath.  Has had multiple presentations for this.  Last month came in and had a CTA which did not show evidence of pericardial effusion or PE.  On exam she is overall well-appearing though somewhat anxious.  Vital signs are reassuring.  No appreciable abnormality on her cardiopulmonary exam and no signs of a DVT.  EKG showed low voltage QRS with old anteroseptal infarct but no acute changes that would suggest ischemia.  Initial troponin was undetectably low.  Given the fact that her symptoms have been present for 2 days do not feel that repeat is indicated.  Chest x-ray without acute findings.  D-dimer also undetectably low so feel that PE is highly unlikely.  Suspect that her symptoms are likely from GERD.  Will have her continue her antacids and follow-up with her primary doctor.  This patient presents to the ED for concern of complaints listed in HPI, this involves an extensive number of treatment options, and is a complaint that carries with it a high risk of complications and morbidity. Disposition including potential need for  admission considered.   Dispo:  DC Home. Return precautions discussed including, but not limited to, those listed in the AVS. Allowed pt time to ask questions which were answered fully prior to dc.  Records reviewed ED Visit Notes The following labs were independently interpreted: Chemistry and show no acute abnormality I independently reviewed the following imaging with scope of interpretation limited to determining acute life threatening conditions related to emergency care: Chest x-ray and agree with the radiologist interpretation with the following exceptions: none I personally reviewed and interpreted cardiac monitoring: normal sinus rhythm  I personally reviewed and interpreted the pt's EKG: see above for interpretation  I have reviewed the patients home medications and made adjustments as needed  Portions of this note were generated with Dragon dictation software. Dictation errors may occur despite best attempts at proofreading.     Final diagnoses:  PUD (peptic ulcer disease)  Chest pain, unspecified type    ED Discharge Orders     None          Yolande Lamar BROCKS, MD 01/30/24 2215

## 2024-01-31 ENCOUNTER — Ambulatory Visit: Admitting: Hematology and Oncology

## 2024-02-03 NOTE — Progress Notes (Deleted)
 Cardiology Office Note:   Date:  02/03/2024  ID:  Laurie Bell, DOB [birth year], MRN [id number] PCP: Delbert Clam, MD  [facility] Providers Cardiologist:  Candyce Reek, MD {  History of Present Illness:   Laurie Bell is a 43 y.o. female who was seen by Dr. Reek for evaluation of palpitations.  He saw her in 2022.  She was in the hospital in 2024 with possible sepsis.  She had an echo that was unremarkable.  She has had multiple ED visits and I reviewed some of these for this visit.  She has had palpitations and chest pain.  There has been no clear cardiac abnormalities.  ***    ***  Records show: Upon exam in office today patient's heart rhythm is irregular.  EKG showed multiple PVCs.  Patient does have a history of hypokalemia.  We will check labs today.  We will place referral to cardiology.  Patient does have a history of a fall 2 years ago and has been having intermittent numbness and pain to the left side of her body since the fall.  We will place referral to neurology as well. ECho in 08/2020 showed: Left ventricular ejection fraction, by estimation, is 65 to 70%. The  left ventricle has normal function. The left ventricle has no regional  wall motion abnormalities. Left ventricular diastolic parameters were  normal.   2. Right ventricular systolic function is normal. The right ventricular  size is normal.   3. The mitral valve is normal in structure. Trivial mitral valve  regurgitation.   4. The aortic valve is normal in structure. Aortic valve regurgitation is  not visualized.    Patient reports occasional irregular heart beats.  Did not feel sx in the office the day of the ECG.   Denies : Dizziness. Leg edema. Nitroglycerin use. Orthopnea.  Paroxysmal nocturnal dyspnea. Shortness of breath. Syncope.     Has chest pain not related to exertion.  It is on the left side of her chest.  Esophageal manometry is planned.  Negative cardiac work-up on  several occasions in the emergency room.  ROS: ***  Studies Reviewed:    EKG:       ***  Risk Assessment/Calculations:   {Does this patient have ATRIAL FIBRILLATION?:[phone]} No BP recorded.  {Refresh Note OR Click here to enter BP  :1}***        Physical Exam:   VS:  There were no vitals taken for this visit.   Wt Readings from Last 3 Encounters:  11/20/23 198 lb 3.2 oz (89.9 kg)  11/19/23 198 lb (89.8 kg)  10/24/23 198 lb (89.8 kg)     GEN: Well nourished, well developed in no acute distress NECK: No JVD; No carotid bruits CARDIAC: ***RR, *** murmurs, rubs, gallops RESPIRATORY:  Clear to auscultation without rales, wheezing or rhonchi  ABDOMEN: Soft, non-tender, non-distended EXTREMITIES:  No edema; No deformity   ASSESSMENT AND PLAN:    PVCs: ***  Noted on prior ECG.  Multiple people in the family with thyroid  problems.  Will check TSH today. I explained to her that these are benign.  Anxiety/panic attack: ***  Can feel racing heart when she has a panic attack.  On meds per PMD.   Chest pain: ***  atypical.  Related to certain foods.  Not related to exertion.  Multiple negative troponins.  Normal echo.  If symptoms persist, we could consider further testing with a coronary CTA but at this time, I think  this is unlikely to be related to  heart.  GI w/u in progress.    Follow up ***  Signed, Lynwood Schilling, MD

## 2024-02-04 ENCOUNTER — Telehealth: Payer: Self-pay | Admitting: Internal Medicine

## 2024-02-04 ENCOUNTER — Inpatient Hospital Stay: Attending: Internal Medicine | Admitting: Internal Medicine

## 2024-02-04 VITALS — BP 116/89 | HR 90 | Temp 97.5°F | Resp 20 | Wt 200.5 lb

## 2024-02-04 DIAGNOSIS — R519 Headache, unspecified: Secondary | ICD-10-CM | POA: Diagnosis present

## 2024-02-04 DIAGNOSIS — Z923 Personal history of irradiation: Secondary | ICD-10-CM | POA: Insufficient documentation

## 2024-02-04 DIAGNOSIS — Z803 Family history of malignant neoplasm of breast: Secondary | ICD-10-CM | POA: Diagnosis not present

## 2024-02-04 DIAGNOSIS — Z9221 Personal history of antineoplastic chemotherapy: Secondary | ICD-10-CM | POA: Diagnosis not present

## 2024-02-04 DIAGNOSIS — Z800 Family history of malignant neoplasm of digestive organs: Secondary | ICD-10-CM | POA: Diagnosis not present

## 2024-02-04 DIAGNOSIS — Z853 Personal history of malignant neoplasm of breast: Secondary | ICD-10-CM | POA: Insufficient documentation

## 2024-02-04 MED ORDER — AMITRIPTYLINE HCL 50 MG PO TABS: 50.0000 mg | ORAL_TABLET | Freq: Every day | ORAL | 2 refills | Status: DC

## 2024-02-04 NOTE — Progress Notes (Signed)
 [facility] [address]  [place], [place] [address] [phone]   Interval Evaluation  Date of Service: 02/04/24 Patient Name: Laurie Bell Patient MRN: [id number] Patient DOB: [birth year] Provider: Arthea MARLA Manns, MD  Identifying Statement:  Laurie Bell is a 43 y.o. female with Chronic daily headache   Referring Provider: Delbert Clam, MD 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, ER 30% weak, PR 2%   10/11/2022 Cancer Staging   Staging form: Breast, AJCC 8th Edition - Clinical: Stage 0 (cTis (DCIS), cN0, cM0, G3, ER+, PR+, HER2: Not Assessed) - Signed by Odean Potts, MD on 10/11/2022 Stage prefix: Initial diagnosis Histologic grading system: 3 grade system   10/11/2022 Genetic Testing   Declined hereditary cancer genetic testing    11/13/2022 Surgery   Right lumpectomy: Grade 3 IDC, 2 cm in size inferior and posterior margins are positive, DCIS posterior margin positive, lymphovascular invasion not identified, ER 0%, PR 0%, Ki-67 85%, HER2 0 by IHC   11/13/2022 Cancer Staging   Staging form: Breast, AJCC 8th Edition - Pathologic stage from 11/13/2022: Stage IB (pT1c, pN0, cM0, G3, ER-, PR-, HER2-) - Signed by Izell Domino, MD on 05/21/2023 Stage prefix: Initial diagnosis Histologic grading system: 3 grade system   12/01/2022 Surgery   Margin excision: Posterior margin: Scattered foci of high-grade DCIS, 0/2 lymph nodes negative   01/01/2023 - 04/10/2023 Chemotherapy   Patient is on Treatment Plan : BREAST ADJUVANT DOSE DENSE AC q14d / PACLitaxel  q7d     05/29/2023 -  06/28/2023 Radiation Therapy   Plan Name: Breast_R Site: Breast, Right Technique: 3D Mode: Photon Dose Per Fraction: 2.67 Gy Prescribed Dose (Delivered / Prescribed): 40.05 Gy / 40.05 Gy Prescribed Fxs (Delivered / Prescribed): 15 / 15   Plan Name: Breast_R_Bst Site: Breast, Right Technique: 3D Mode: Photon Dose Per Fraction: 2 Gy Prescribed Dose (Delivered / Prescribed): 10 Gy / 10 Gy Prescribed Fxs (Delivered / Prescribed): 5 / 5     Interval History: Laurie Bell presents today for follow up for headache syndrome.  She continues to experience daily headaches, mostly non-migrainous, unchanged from prior.  Continues on Topamax  50mg  twice per day, and Maxalt  PRN for breakthrough.  Sleep remains very poor, frequent awakenings and snoring.  She is tired throughout the day.  Burden of anxiety is very high, this interferes with her ability to fall asleep.  Has been dosing ambien  5mg  each night.  H+P (10/08/23): Patient presents today to review recent headache syndrome.  She describe a 1 year history of near daily continuous headache, started around the time of her cancer diagnosis.  Pain is holocranial or bitemporal, associated with mild photophobia at times but no nausea.  No neurologic deficits.  Has been dosing Tylenol  or oxycodone , but less than daily.  Topamax  has not been dosed regularly, prescribed by her PCP.  Sleep has been very poor, she is up late with the TV on frequently.  Otherwise continues to follow with Dr. Gudena.  Medications: Current Outpatient Medications on File Prior to Visit  Medication Sig Dispense Refill   acetaminophen  (TYLENOL ) 500 MG tablet Take 1,000 mg by mouth every 6 (six)  hours as needed for moderate pain.     albuterol  (VENTOLIN  HFA) 108 (90 Base) MCG/ACT inhaler TAKE 2 PUFFS BY MOUTH EVERY 6 HOURS AS NEEDED FOR WHEEZE OR SHORTNESS OF BREATH (Patient taking differently: Inhale 2 puffs into the lungs every 6 (six) hours as needed for wheezing or shortness  of breath.) 54 each 0   AMBULATORY NON FORMULARY MEDICATION Medication Name: GI Cocktail: 270 mL viscous Xylocaine  2%, 270 mL Dicyclomine 10 mg/5 mL, 810 mL Mylanta/Maylox. Take 8-10 mL every 4-6 hours as needed for severe pain Dispense 900 mL 900 m 1   clindamycin  (CLEOCIN ) 300 MG capsule Take 1 capsule (300 mg total) by mouth 2 (two) times daily. 20 capsule 0   cyclobenzaprine  (FLEXERIL ) 10 MG tablet Take 1 tablet (10 mg total) by mouth 2 (two) times daily as needed for muscle spasms. TAKE 1 TABLET BY MOUTH AT BEDTIME AS NEEDED FOR MUSCLE SPASMS 180 tablet 1   esomeprazole  (NEXIUM ) 40 MG capsule Take 1 capsule (40 mg total) by mouth 2 (two) times daily before a meal. 60 capsule 4   famotidine  (PEPCID ) 40 MG tablet Take 1 tablet (40 mg total) by mouth at bedtime. 30 tablet 2   fluticasone  (FLONASE ) 50 MCG/ACT nasal spray Place 2 sprays into both nostrils daily. 16 g 6   gabapentin  (NEURONTIN ) 400 MG capsule TAKE 1 CAPSULE BY MOUTH IN AM AND NOON, AND 2 CAPSULE BY MOUTH AT BEDTIME 56 capsule 0   hydrOXYzine  (ATARAX ) 25 MG tablet TAKE 1 TABLET(25 MG) BY MOUTH THREE TIMES DAILY 270 tablet 1   ipratropium (ATROVENT ) 0.03 % nasal spray Place into the nose.     linaclotide  (LINZESS ) 145 MCG CAPS capsule Take 1 capsule (145 mcg total) by mouth daily before breakfast. (Patient taking differently: Take 145 mcg by mouth as needed.) 30 capsule 5   methimazole  (TAPAZOLE ) 5 MG tablet Take 1 tablet (5 mg total) by mouth as directed. 1 tablet Monday through Thursday, skip rest of the week 52 tablet 3   metoCLOPramide  (REGLAN ) 10 MG tablet Take 1 tablet (10 mg total) by mouth every 6 (six) hours. 30 tablet 0   metoprolol  succinate (TOPROL -XL) 25 MG 24 hr tablet Take 0.5 tablets (12.5 mg total) by mouth daily. For increased heart rate 45 tablet 1   naloxone (NARCAN) nasal spray 4 mg/0.1 mL 1 (ONE) SPRAY AS NEEDED     naproxen  (NAPROSYN ) 500 MG tablet TAKE 1 TABLET BY MOUTH TWICE A DAY AS NEEDED FOR PAIN 60 tablet 1    oxyCODONE -acetaminophen  (PERCOCET/ROXICET) 5-325 MG tablet Take 1 tablet by mouth 4 (four) times daily.     potassium chloride  (KLOR-CON ) 10 MEQ tablet Take 1 tablet (10 mEq total) by mouth daily. 90 tablet 1   rizatriptan  (MAXALT ) 10 MG tablet Take 1 tablet (10 mg total) by mouth as needed for migraine. May repeat in 2 hours if needed 10 tablet 2   sucralfate  (CARAFATE ) 1 GM/10ML suspension Take 10 mLs (1 g total) by mouth 4 (four) times daily. Take 2 teaspoons with each meal and 2 teaspoons at bedtime. Do not take within 2 hours of any other medications 400 mL 0   sucralfate  (CARAFATE ) 1 GM/10ML suspension Take 10 mLs (1 g total) by mouth 4 (four) times daily. 1200 mL 0   topiramate  (TOPAMAX ) 50 MG tablet Take 1 tablet (50 mg total) by mouth 2 (two) times daily. 180 tablet 1   venlafaxine  XR (EFFEXOR  XR) 75 MG 24 hr capsule Take 1 capsule (  75 mg total) by mouth daily with breakfast. 90 capsule 1   Vitamin D , Ergocalciferol , (DRISDOL) 1.25 MG (50000 UNIT) CAPS capsule Take 50,000 Units by mouth once a week.     zolpidem  (AMBIEN ) 5 MG tablet TAKE 1 TABLET BY MOUTH AT BEDTIME AS NEEDED FOR SLEEP. 30 tablet 3   [DISCONTINUED] medroxyPROGESTERone  (PROVERA ) 10 MG tablet Take 2 tablets by mouth daily (Patient not taking: Reported on 06/05/2019) 60 tablet 0   Current Facility-Administered Medications on File Prior to Visit  Medication Dose Route Frequency Provider Last Rate Last Admin   0.9 %  sodium chloride  infusion  500 mL Intravenous Once Dorsey, Ying C, MD       0.9 %  sodium chloride  infusion  500 mL Intravenous Once Dorsey, Ying C, MD        Allergies:  Allergies  Allergen Reactions   Latex Other (See Comments)    Burn in vaginal area with latex condoms   Tramadol  Other (See Comments)    States it messes me up no further info   Past Medical History:  Past Medical History:  Diagnosis Date   Allergy    Anxiety    Arthritis    Breast cancer (HCC)    Chlamydia contact, treated     GERD (gastroesophageal reflux disease)    Hyperthyroidism    Personal history of chemotherapy    Personal history of radiation therapy    Past Surgical History:  Past Surgical History:  Procedure Laterality Date   AXILLARY SENTINEL NODE BIOPSY Right 12/01/2022   Procedure: RIGHT AXILLARY SENTINEL NODE BIOPSY;  Surgeon: Vernetta Berg, MD;  Location: [facility];  Service: General;  Laterality: Right;   BREAST BIOPSY Right 10/02/2022   MM RT BREAST BX W LOC DEV 1ST LESION IMAGE BX SPEC STEREO GUIDE 10/02/2022 [facility]   BREAST BIOPSY  11/10/2022   MM RT RADIOACTIVE SEED LOC MAMMO GUIDE 11/10/2022 [facility]   BREAST LUMPECTOMY WITH RADIOACTIVE SEED LOCALIZATION Right 11/13/2022   Procedure: RIGHT BREAST LUMPECTOMY WITH RADIOACTIVE SEED LOCALIZATION;  Surgeon: Vernetta Berg, MD;  Location: [facility];  Service: General;  Laterality: Right;   ENDOMETRIAL ABLATION N/A 04/25/2017   Procedure: ENDOMETRIAL ABLATION With NOVASURE;  Surgeon: Eveline Lynwood MATSU, MD;  Location: [facility];  Service: Gynecology;  Laterality: N/A;   ESOPHAGEAL MANOMETRY N/A 07/27/2021   Procedure: ESOPHAGEAL MANOMETRY (EM);  Surgeon: Federico Rosario BROCKS, MD;  Location: [facility];  Service: Gastroenterology;  Laterality: N/A;   PORTACATH PLACEMENT N/A 12/01/2022   Procedure: INSERTION PORT-A-CATH WITH ULTRASOUND GUIDANCE;  Surgeon: Vernetta Berg, MD;  Location: [facility];  Service: General;  Laterality: N/A;   RE-EXCISION OF BREAST CANCER,SUPERIOR MARGINS Right 12/01/2022   Procedure: RE-EXCISION OF RIGHT BREAST CANCER;  Surgeon: Vernetta Berg, MD;  Location: [facility];  Service: General;  Laterality: Right;   TUBAL LIGATION  2003   Social History:  Social History   Socioeconomic History   Marital status: Single    Spouse name: Not on file   Number of children: 2   Years of education: Not on file   Highest education level: Not on file  Occupational History   Occupation:  Event organiser: [employer]  Tobacco Use   Smoking status: Never   Smokeless tobacco: Never  Vaping Use   Vaping status: Never Used  Substance and Sexual Activity   Alcohol use: No   Drug use: Not Currently    Types: Marijuana  Comment: quit 2018   Sexual activity: Not Currently    Birth control/protection: Surgical    Comment: B.T. L  Other Topics Concern   Not on file  Social History Narrative   ** Merged History Encounter **       Right Handed  Lives in a one story home    Social Drivers of Health   Financial Resource Strain: Medium Risk (11/03/2021)   Overall Financial Resource Strain (CARDIA)    Difficulty of Paying Living Expenses: Somewhat hard  Food Insecurity: No Food Insecurity (09/27/2023)   Hunger Vital Sign    Worried About Running Out of Food in the Last Year: Never true    Ran Out of Food in the Last Year: Never true  Transportation Needs: No Transportation Needs (09/27/2023)   PRAPARE - Administrator, Civil Service (Medical): No    Lack of Transportation (Non-Medical): No  Physical Activity: Sufficiently Active (11/03/2021)   Exercise Vital Sign    Days of Exercise per Week: 3 days    Minutes of Exercise per Session: 50 min  Stress: Stress Concern Present (11/03/2021)   [employer] of Occupational Health - Occupational Stress Questionnaire    Feeling of Stress : Very much  Social Connections: Moderately Isolated (11/03/2021)   Social Connection and Isolation Panel    Frequency of Communication with Friends and Family: Once a week    Frequency of Social Gatherings with Friends and Family: Once a week    Attends Religious Services: More than 4 times per year    Active Member of [employer] or Organizations: No    Attends Banker Meetings: Never    Marital Status: Living with partner  Intimate Partner Violence: Not At Risk (09/27/2023)   Humiliation, Afraid, Rape, and Kick questionnaire    Fear of Current or Ex-Partner: No     Emotionally Abused: No    Physically Abused: No    Sexually Abused: No   Family History:  Family History  Problem Relation Age of Onset   Hypertension Mother    Hypertension Maternal Aunt    Breast cancer Maternal Grandmother        dx > 50   Stomach cancer Maternal Grandfather        dx > 50   Breast cancer Paternal Grandmother        dx > 50   Esophageal cancer Neg Hx    Rectal cancer Neg Hx    Colon cancer Neg Hx     Review of Systems: Constitutional: Doesn't report fevers, chills or abnormal weight loss Eyes: Doesn't report blurriness of vision Ears, nose, mouth, throat, and face: Doesn't report sore throat Respiratory: Doesn't report cough, dyspnea or wheezes Cardiovascular: Doesn't report palpitation, chest discomfort  Gastrointestinal:  Doesn't report nausea, constipation, diarrhea GU: Doesn't report incontinence Skin: Doesn't report skin rashes Neurological: Per HPI Musculoskeletal: Doesn't report joint pain Behavioral/Psych: ++anxiety  Physical Exam: Vitals:   02/04/24 1440  BP: 116/89  Pulse: 90  Resp: 20  Temp: (!) 97.5 F (36.4 C)  SpO2: 99%   KPS: 90. General: Alert, cooperative, pleasant, in no acute distress Head: Normal EENT: No conjunctival injection or scleral icterus.  Lungs: Resp effort normal Cardiac: Regular rate Abdomen: Non-distended abdomen Skin: No rashes cyanosis or petechiae. Extremities: No clubbing or edema  Neurologic Exam: Mental Status: Awake, alert, attentive to examiner. Oriented to self and environment. Language is fluent with intact comprehension.  Cranial Nerves: Visual acuity is grossly normal. Visual  fields are full. Extra-ocular movements intact. No ptosis. Face is symmetric Motor: Tone and bulk are normal. Power is full in both arms and legs. Reflexes are symmetric, no pathologic reflexes present.  Sensory: Intact to light touch Gait: Normal.   Labs: I have reviewed the data as listed    Component Value  Date/Time   NA 138 01/30/2024 1922   NA 142 11/20/2023 0945   K 3.3 (L) 01/30/2024 1922   CL 106 01/30/2024 1922   CO2 21 (L) 01/30/2024 1922   GLUCOSE 93 01/30/2024 1922   BUN 10 01/30/2024 1922   BUN 8 11/20/2023 0945   CREATININE 0.88 01/30/2024 1922   CREATININE 0.74 05/08/2023 1121   CALCIUM  9.2 01/30/2024 1922   PROT 7.3 12/18/2023 1538   PROT 6.9 04/15/2021 0942   ALBUMIN 3.6 12/18/2023 1538   ALBUMIN 4.3 04/15/2021 0942   AST 19 12/18/2023 1538   AST 39 05/08/2023 1121   ALT 18 12/18/2023 1538   ALT 62 (H) 05/08/2023 1121   ALKPHOS 192 (H) 12/18/2023 1538   BILITOT 0.4 12/18/2023 1538   BILITOT 0.5 05/08/2023 1121   GFRNONAA >60 01/30/2024 1922   GFRNONAA >60 05/08/2023 1121   GFRAA >60 02/01/2020 2233   Lab Results  Component Value Date   WBC 8.3 01/30/2024   NEUTROABS 4.6 10/23/2023   HGB 12.5 01/30/2024   HCT 37.2 01/30/2024   MCV 80.3 01/30/2024   PLT 253 01/30/2024    Imaging: CLINICAL DATA:  Provided history: Malignant neoplasm of upper-outer quadrant of right female breast in female, estrogen receptor negative. Brain metastases suspected.   EXAM: MRI HEAD WITHOUT AND WITH CONTRAST   TECHNIQUE: Multiplanar, multiecho pulse sequences of the brain and surrounding structures were obtained without and with intravenous contrast.   CONTRAST:  10mL GADAVIST  GADOBUTROL  1 MMOL/ML IV SOLN   COMPARISON:  Head CT 07/05/2023.  Brain MRI 06/25/2021.   FINDINGS: Brain:   No age-advanced or lobar predominant parenchymal atrophy.   No cortical encephalomalacia is identified. No significant cerebral white matter disease.   There is no acute infarct.   No evidence of an intracranial mass.   No chronic intracranial blood products.   No extra-axial fluid collection.   No midline shift.   No pathologic intracranial enhancement identified.   Vascular: Maintained flow voids within the proximal large arterial vessels. Small developmental venous anomaly  within the right cerebellar hemisphere.   Skull and upper cervical spine: No focal worrisome marrow lesion.   Sinuses/Orbits: No mass or acute finding within the imaged orbits. No significant paranasal sinus disease.   IMPRESSION: No evidence of intracranial metastatic disease.     Electronically Signed   By: Rockey Childs D.O.   On: 07/18/2023 10:52  Assessment/Plan Chronic daily headache  Pilar N Buswell presents with clinical syndrome c/w chronic daily headache.  Etiology is likely sleep issues, especially since pain medications were reduced considerably in recent months.  We recommended referral for sleep study given risk factors and symptoms of sleep apnea.  She actually saw a sleep provider today who is arranging this.  Ok with continued PRN use of pain medications and daily Topamax  for now; the goal will be resolution of headaches and discontinuing of these medications.  In the interim she is also agreeable with trial of Elavil  50mg  HS for sleep and headache issues, she is no longer dosing venlafaxine .  We spent twenty additional minutes teaching regarding the natural history, biology, and historical experience in the treatment of  neurologic complications of cancer.   We appreciate the opportunity to participate in the care of Laurie Bell.   We ask that Laurie Bell return to clinic in 1-2 months for headache follow up, or sooner as needed.  All questions were answered. The patient knows to call the clinic with any problems, questions or concerns. No barriers to learning were detected.  The total time spent in the encounter was 30 minutes and more than 50% was on counseling and review of test results   Arthea MARLA Manns, MD Medical Director of Neuro-Oncology [facility] 02/04/24 2:36 PM

## 2024-02-04 NOTE — Telephone Encounter (Signed)
 Scheduled appointment per 8/25 los. Talked with the patient and she is aware of the made appointment.

## 2024-02-05 ENCOUNTER — Telehealth: Payer: Self-pay

## 2024-02-05 ENCOUNTER — Ambulatory Visit: Admitting: Cardiology

## 2024-02-05 DIAGNOSIS — I493 Ventricular premature depolarization: Secondary | ICD-10-CM

## 2024-02-05 DIAGNOSIS — R072 Precordial pain: Secondary | ICD-10-CM

## 2024-02-05 NOTE — Telephone Encounter (Signed)
 Patient was called to be made aware of an appointment that she missed with Dr. Lavona. She has been rescheduled for 02/19/24 with Lum Louis, NP.

## 2024-02-06 ENCOUNTER — Inpatient Hospital Stay (HOSPITAL_BASED_OUTPATIENT_CLINIC_OR_DEPARTMENT_OTHER): Admitting: Hematology and Oncology

## 2024-02-06 VITALS — BP 126/88 | HR 91 | Temp 97.1°F | Resp 18 | Ht 66.0 in | Wt 200.5 lb

## 2024-02-06 DIAGNOSIS — C50411 Malignant neoplasm of upper-outer quadrant of right female breast: Secondary | ICD-10-CM

## 2024-02-06 DIAGNOSIS — Z171 Estrogen receptor negative status [ER-]: Secondary | ICD-10-CM

## 2024-02-06 DIAGNOSIS — R519 Headache, unspecified: Secondary | ICD-10-CM | POA: Diagnosis not present

## 2024-02-06 NOTE — Progress Notes (Signed)
 Patient Care Team: Delbert Clam, MD as PCP - General (Family Medicine) Dann Candyce RAMAN, MD as PCP - Cardiology (Cardiology) Tobie Tonita POUR, DO as Consulting Physician (Neurology) Vernetta Berg, MD as Consulting Physician (General Surgery) Odean Potts, MD as Consulting Physician (Hematology and Oncology) Izell Domino, MD as Attending Physician (Radiation Oncology) Ezzard Rolin BIRCH, LCSW as Social Worker (Licensed Clinical Social Worker) Ezzard Rolin BIRCH, LCSW as VBCI Care Management (Licensed Clinical Social Worker)  DIAGNOSIS:  Encounter Diagnosis  Name Primary?   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, ER 30% weak, PR 2%   10/11/2022 Cancer Staging   Staging form: Breast, AJCC 8th Edition - Clinical: Stage 0 (cTis (DCIS), cN0, cM0, G3, ER+, PR+, HER2: Not Assessed) - Signed by Odean Potts, MD on 10/11/2022 Stage prefix: Initial diagnosis Histologic grading system: 3 grade system   10/11/2022 Genetic Testing   Declined hereditary cancer genetic testing    11/13/2022 Surgery   Right lumpectomy: Grade 3 IDC, 2 cm in size inferior and posterior margins are positive, DCIS posterior margin positive, lymphovascular invasion not identified, ER 0%, PR 0%, Ki-67 85%, HER2 0 by IHC   11/13/2022 Cancer Staging   Staging form: Breast, AJCC 8th Edition - Pathologic stage from 11/13/2022: Stage IB (pT1c, pN0, cM0, G3, ER-, PR-, HER2-) - Signed by Izell Domino, MD on 05/21/2023 Stage prefix: Initial diagnosis Histologic grading system: 3 grade system   12/01/2022 Surgery   Margin  excision: Posterior margin: Scattered foci of high-grade DCIS, 0/2 lymph nodes negative   01/01/2023 - 04/10/2023 Chemotherapy   Patient is on Treatment Plan : BREAST ADJUVANT DOSE DENSE AC q14d / PACLitaxel  q7d     05/29/2023 - 06/28/2023 Radiation Therapy   Plan Name: Breast_R Site: Breast, Right Technique: 3D Mode: Photon Dose Per Fraction: 2.67 Gy Prescribed Dose (Delivered / Prescribed): 40.05 Gy / 40.05 Gy Prescribed Fxs (Delivered / Prescribed): 15 / 15   Plan Name: Breast_R_Bst Site: Breast, Right Technique: 3D Mode: Photon Dose Per Fraction: 2 Gy Prescribed Dose (Delivered / Prescribed): 10 Gy / 10 Gy Prescribed Fxs (Delivered / Prescribed): 5 / 5     CHIEF COMPLIANT: Surveillance of breast cancer, diffuse body aches and pains especially left shoulder and neck pain  HISTORY OF PRESENT ILLNESS:   History of Present Illness Laurie Bell is a 43 year old female with breast cancer who presents for a follow-up to discuss a treatment plan.  She experiences significant shoulder pain, described as fibromyalgia-type, localized to the muscles around her shoulder. She has tried dry needling with some relief.  She has numbness in her feet and hands, which is intermittent. Neuropathy was previously considered as a possible cause, potentially related to chemotherapy.  She completed a mammogram in July and has an MRI scheduled for January. A blood test for breast cancer was negative.     ALLERGIES:  is allergic to latex and tramadol .  MEDICATIONS:  Current Outpatient Medications  Medication Sig Dispense Refill   acetaminophen  (TYLENOL ) 500 MG tablet Take 1,000 mg by mouth every 6 (six) hours as needed for moderate pain.     albuterol  (VENTOLIN  HFA) 108 (90  Base) MCG/ACT inhaler TAKE 2 PUFFS BY MOUTH EVERY 6 HOURS AS NEEDED FOR WHEEZE OR SHORTNESS OF BREATH 54 each 0   AMBULATORY NON FORMULARY MEDICATION Medication Name: GI Cocktail: 270 mL viscous Xylocaine  2%, 270  mL Dicyclomine 10 mg/5 mL, 810 mL Mylanta/Maylox. Take 8-10 mL every 4-6 hours as needed for severe pain Dispense 900 mL 900 m 1   amitriptyline  (ELAVIL ) 50 MG tablet Take 1 tablet (50 mg total) by mouth at bedtime. 30 tablet 2   clindamycin  (CLEOCIN ) 300 MG capsule Take 1 capsule (300 mg total) by mouth 2 (two) times daily. 20 capsule 0   cyclobenzaprine  (FLEXERIL ) 10 MG tablet Take 1 tablet (10 mg total) by mouth 2 (two) times daily as needed for muscle spasms. TAKE 1 TABLET BY MOUTH AT BEDTIME AS NEEDED FOR MUSCLE SPASMS 180 tablet 1   esomeprazole  (NEXIUM ) 40 MG capsule Take 1 capsule (40 mg total) by mouth 2 (two) times daily before a meal. 60 capsule 4   famotidine  (PEPCID ) 40 MG tablet Take 1 tablet (40 mg total) by mouth at bedtime. 30 tablet 2   fluticasone  (FLONASE ) 50 MCG/ACT nasal spray Place 2 sprays into both nostrils daily. 16 g 6   gabapentin  (NEURONTIN ) 400 MG capsule TAKE 1 CAPSULE BY MOUTH IN AM AND NOON, AND 2 CAPSULE BY MOUTH AT BEDTIME 56 capsule 0   hydrOXYzine  (ATARAX ) 25 MG tablet TAKE 1 TABLET(25 MG) BY MOUTH THREE TIMES DAILY 270 tablet 1   methimazole  (TAPAZOLE ) 5 MG tablet Take 1 tablet (5 mg total) by mouth as directed. 1 tablet Monday through Thursday, skip rest of the week 52 tablet 3   metoCLOPramide  (REGLAN ) 10 MG tablet Take 1 tablet (10 mg total) by mouth every 6 (six) hours. 30 tablet 0   metoprolol  succinate (TOPROL -XL) 25 MG 24 hr tablet Take 0.5 tablets (12.5 mg total) by mouth daily. For increased heart rate 45 tablet 1   naloxone (NARCAN) nasal spray 4 mg/0.1 mL 1 (ONE) SPRAY AS NEEDED     oxyCODONE -acetaminophen  (PERCOCET/ROXICET) 5-325 MG tablet Take 1 tablet by mouth 4 (four) times daily.     potassium chloride  (KLOR-CON ) 10 MEQ tablet Take 1 tablet (10 mEq total) by mouth daily. 90 tablet 1   rizatriptan  (MAXALT ) 10 MG tablet Take 1 tablet (10 mg total) by mouth as needed for migraine. May repeat in 2 hours if needed 10 tablet 2   sucralfate  (CARAFATE ) 1  GM/10ML suspension Take 10 mLs (1 g total) by mouth 4 (four) times daily. Take 2 teaspoons with each meal and 2 teaspoons at bedtime. Do not take within 2 hours of any other medications 400 mL 0   sucralfate  (CARAFATE ) 1 GM/10ML suspension Take 10 mLs (1 g total) by mouth 4 (four) times daily. 1200 mL 0   topiramate  (TOPAMAX ) 50 MG tablet Take 1 tablet (50 mg total) by mouth 2 (two) times daily. 180 tablet 1   UNABLE TO FIND Med Name: Lido 2%/Dicyclo/Antacid 113 susp     Vitamin D , Ergocalciferol , (DRISDOL) 1.25 MG (50000 UNIT) CAPS capsule Take 50,000 Units by mouth once a week.     zolpidem  (AMBIEN ) 5 MG tablet TAKE 1 TABLET BY MOUTH AT BEDTIME AS NEEDED FOR SLEEP. 30 tablet 3   ipratropium (ATROVENT ) 0.03 % nasal spray Place into the nose. (Patient not taking: Reported on 02/06/2024)     linaclotide  (LINZESS ) 145 MCG CAPS capsule Take 1 capsule (145 mcg total) by mouth daily before breakfast. (Patient taking differently:  Take 145 mcg by mouth as needed.) 30 capsule 5   Current Facility-Administered Medications  Medication Dose Route Frequency Provider Last Rate Last Admin   0.9 %  sodium chloride  infusion  500 mL Intravenous Once Federico Rosario BROCKS, MD       0.9 %  sodium chloride  infusion  500 mL Intravenous Once Federico Rosario BROCKS, MD        PHYSICAL EXAMINATION: ECOG PERFORMANCE STATUS: 1 - Symptomatic but completely ambulatory  Vitals:   02/06/24 0900  BP: 126/88  Pulse: 91  Resp: 18  Temp: (!) 97.1 F (36.2 C)  SpO2: 97%   Filed Weights   02/06/24 0900  Weight: 200 lb 8 oz (90.9 kg)    Physical Exam MUSCULOSKELETAL: Shoulder normal. Breast: No palpable lumps or nodules in bilateral breasts or axilla Tenderness in the left shoulder and neck  (exam performed in the presence of a chaperone)  LABORATORY DATA:  I have reviewed the data as listed    Latest Ref Rng & Units 01/30/2024    7:22 PM 12/18/2023    3:38 PM 12/18/2023    1:38 PM  CMP  Glucose 70 - 99 mg/dL 93   891   BUN 6  - 20 mg/dL 10   9   Creatinine 9.55 - 1.00 mg/dL 9.11   9.25   Sodium 864 - 145 mmol/L 138   138   Potassium 3.5 - 5.1 mmol/L 3.3   3.5   Chloride 98 - 111 mmol/L 106   105   CO2 22 - 32 mmol/L 21   23   Calcium  8.9 - 10.3 mg/dL 9.2   8.9   Total Protein 6.5 - 8.1 g/dL  7.3    Total Bilirubin 0.0 - 1.2 mg/dL  0.4    Alkaline Phos 38 - 126 U/L  192    AST 15 - 41 U/L  19    ALT 0 - 44 U/L  18      Lab Results  Component Value Date   WBC 8.3 01/30/2024   HGB 12.5 01/30/2024   HCT 37.2 01/30/2024   MCV 80.3 01/30/2024   PLT 253 01/30/2024   NEUTROABS 4.6 10/23/2023    ASSESSMENT & PLAN:   Assessment & Plan History of malignant neoplasm of upper-outer quadrant of right breast, status post chemotherapy Normal mammogram in July. Negative blood test for breast cancer monitoring. - Continue with annual follow-up visits. - Schedule MRI for January. - Continue blood test for breast cancer monitoring every six months.  Chemotherapy-induced neuropathy Intermittent numbness in hands and feet, likely residual effect from chemotherapy.  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 - Refer to physical therapy for dry needling.  Follow-up with Morna in 1 year    Orders Placed This Encounter  Procedures   Ambulatory referral to Physical Therapy    Referral Priority:   Routine    Referral Type:   Physical Medicine    Referral Reason:   Specialty Services Required    Requested Specialty:   Physical Therapy    Number of Visits Requested:   1   The patient has a good understanding of the overall plan. she agrees with it. she will call with any problems that may develop before the next visit here. Total time spent: 30 mins including face to face time and time spent for planning, charting and co-ordination of care   Laurie MARLA Chad, MD 02/06/24

## 2024-02-15 ENCOUNTER — Emergency Department (HOSPITAL_BASED_OUTPATIENT_CLINIC_OR_DEPARTMENT_OTHER): Admitting: Radiology

## 2024-02-15 ENCOUNTER — Emergency Department (HOSPITAL_BASED_OUTPATIENT_CLINIC_OR_DEPARTMENT_OTHER)
Admission: EM | Admit: 2024-02-15 | Discharge: 2024-02-15 | Disposition: A | Discharge status: Home / Self Care | Attending: Emergency Medicine | Admitting: Emergency Medicine

## 2024-02-15 ENCOUNTER — Other Ambulatory Visit: Payer: Self-pay

## 2024-02-15 ENCOUNTER — Emergency Department (HOSPITAL_BASED_OUTPATIENT_CLINIC_OR_DEPARTMENT_OTHER)

## 2024-02-15 DIAGNOSIS — M25561 Pain in right knee: Secondary | ICD-10-CM | POA: Insufficient documentation

## 2024-02-15 DIAGNOSIS — Z91040 Latex allergy status: Secondary | ICD-10-CM | POA: Insufficient documentation

## 2024-02-15 DIAGNOSIS — Z8616 Personal history of COVID-19: Secondary | ICD-10-CM | POA: Insufficient documentation

## 2024-02-15 DIAGNOSIS — Z853 Personal history of malignant neoplasm of breast: Secondary | ICD-10-CM | POA: Diagnosis not present

## 2024-02-15 LAB — BASIC METABOLIC PANEL WITH GFR
Anion gap: 12 (ref 5–15)
BUN: 11 mg/dL (ref 6–20)
CO2: 22 mmol/L (ref 22–32)
Calcium: 9.6 mg/dL (ref 8.9–10.3)
Chloride: 104 mmol/L (ref 98–111)
Creatinine, Ser: 0.96 mg/dL (ref 0.44–1.00)
GFR, Estimated: >60 mL/min (ref >60)
Glucose, Bld: 117 mg/dL — ABNORMAL HIGH (ref 70–99)
Potassium: 3.7 mmol/L (ref 3.5–5.1)
Sodium: 138 mmol/L (ref 135–145)

## 2024-02-15 LAB — CBC WITH DIFFERENTIAL/PLATELET
Abs Immature Granulocytes: 0.02 K/uL (ref 0.00–0.07)
Basophils Absolute: 0 K/uL (ref 0.0–0.1)
Basophils Relative: 0 %
Eosinophils Absolute: 0.2 K/uL (ref 0.0–0.5)
Eosinophils Relative: 3 %
HCT: 37 % (ref 36.0–46.0)
Hemoglobin: 12.4 g/dL (ref 12.0–15.0)
Immature Granulocytes: 0 %
Lymphocytes Relative: 36 %
Lymphs Abs: 2.8 K/uL (ref 0.7–4.0)
MCH: 27.1 pg (ref 26.0–34.0)
MCHC: 33.5 g/dL (ref 30.0–36.0)
MCV: 80.8 fL (ref 80.0–100.0)
Monocytes Absolute: 0.6 K/uL (ref 0.1–1.0)
Monocytes Relative: 8 %
Neutro Abs: 4.1 K/uL (ref 1.7–7.7)
Neutrophils Relative %: 53 %
Platelets: 242 K/uL (ref 150–400)
RBC: 4.58 MIL/uL (ref 3.87–5.11)
RDW: 14.3 % (ref 11.5–15.5)
WBC: 7.7 K/uL (ref 4.0–10.5)
nRBC: 0 % (ref 0.0–0.2)

## 2024-02-15 LAB — TROPONIN T, HIGH SENSITIVITY: Troponin T High Sensitivity: <15 ng/L (ref 0–19)

## 2024-02-15 MED ORDER — ACETAMINOPHEN 500 MG PO TABS
1000.0000 mg | ORAL_TABLET | Freq: Once | ORAL | Status: AC
  Administered 2024-02-15: 1000 mg via ORAL
  Filled 2024-02-15: qty 2

## 2024-02-15 NOTE — ED Triage Notes (Signed)
 Pt c/o R knee pain that started 3 days ago and started shooting down to her ankle and up into her hip since yesterday.  Also, c/o intermittent L chest pain that started today. Takes meds for reflux.  Breast CA hx, chemo/rad 2024

## 2024-02-15 NOTE — ED Notes (Signed)
 Pt given discharge instructions. Opportunities given for questions. Pt verbalizes understanding. Bethena Powell SAUNDERS, RN

## 2024-02-15 NOTE — Discharge Instructions (Signed)
 While you were in the emergency room, you had an ultrasound of your leg that did not show blood clot.  You had an x-ray of your knee that was negative.  I recommend taking Tylenol , applying ice to the area and resting your knee over the next few days.  Symptoms should improve.  Follow-up with your primary care doctor.  Return to the emergency department develop worsening pain, redness or swelling of your knee or fever.

## 2024-02-15 NOTE — ED Provider Notes (Signed)
 [place] [facility]ERGENCY DE[facility]MENT AT [facility] Provider Note  CSN: [id number] Arrival date & time: 02/15/24 0805  Chief Complaint(s) Knee Pain  HPI Laurie Bell is a 43 y.o. female with a history of breast cancer, currently in the surveillance stage.  She is here today with right knee pain that has been ongoing for the last couple of days.  She denies any trauma to the area.  Describes pain in the back of her right knee, some radiation up her leg.  No fever, no chills.  No history of gout.   Past Medical History Past Medical History:  Diagnosis Date   Allergy    Anxiety    Arthritis    Breast cancer (HCC)    Chlamydia contact, treated    GERD (gastroesophageal reflux disease)    Hyperthyroidism    Personal history of chemotherapy    Personal history of radiation therapy    Patient Active Problem List   Diagnosis Date Noted   PVC's (premature ventricular contractions) 11/28/2023   Palpitations 11/22/2023   Precordial chest pain 11/22/2023   Chronic daily headache 10/08/2023   Trichomonas vaginitis 03/28/2023   Friable cervix 03/27/2023   PCB (post coital bleeding) 03/27/2023   Screening for cervical cancer 03/27/2023   Sepsis (HCC) 02/17/2023   COVID-19 virus infection 02/17/2023   Lower extremity pain, anterior, right 01/22/2023   SIRS (systemic inflammatory response syndrome) (HCC) 01/21/2023   Malignant neoplasm of upper-outer quadrant of right breast in female, estrogen receptor negative (HCC) 10/09/2022   Multinodular goiter 03/22/2022   Hyperthyroidism 03/22/2022   MDD (major depressive disorder), recurrent episode, moderate (HCC) 11/03/2021   GAD (generalized anxiety disorder) 11/03/2021   Neck pain 10/28/2021   Cervical high risk HPV (human papillomavirus) test positive 10/17/2021   Irregular heart rhythm 04/15/2021   Numbness on left side 04/15/2021   Dysphagia 03/17/2021   Hypomagnesemia 02/23/2021   Gastroesophageal reflux disease     Hypokalemia 08/24/2020   Atypical chest pain 08/24/2020   Nausea and vomiting 08/24/2020   Hypocalcemia 08/24/2020   DUB (dysfunctional uterine bleeding) 01/12/2017   Fibroid, uterine 01/12/2017   Home Medication(s) Prior to Admission medications   Medication Sig Start Date End Date Taking? Authorizing Provider  metroNIDAZOLE  (FLAGYL ) 500 MG tablet Take 500 mg by mouth 2 (two) times daily. 02/08/24  Yes [provider]  oxyCODONE -acetaminophen  (PERCOCET) 10-325 MG tablet Take 1 tablet by mouth 4 (four) times daily as needed. 02/08/24  Yes [provider]  acetaminophen  (TYLENOL ) 500 MG tablet Take 1,000 mg by mouth every 6 (six) hours as needed for moderate pain.    [provider]  albuterol  (VENTOLIN  HFA) 108 (90 Base) MCG/ACT inhaler TAKE 2 PUFFS BY MOUTH EVERY 6 HOURS AS NEEDED FOR WHEEZE OR SHORTNESS OF BREATH 12/22/22   Newlin, Enobong, MD  AMBULATORY NON FORMULARY MEDICATION Medication Name: GI Cocktail: 270 mL viscous Xylocaine  2%, 270 mL Dicyclomine 10 mg/5 mL, 810 mL Mylanta/Maylox. Take 8-10 mL every 4-6 hours as needed for severe pain Dispense 900 mL 11/19/23   Federico Rosario BROCKS, MD  amitriptyline  (ELAVIL ) 50 MG tablet Take 1 tablet (50 mg total) by mouth at bedtime. 02/04/24   Vaslow, Zachary K, MD  clindamycin  (CLEOCIN ) 300 MG capsule Take 1 capsule (300 mg total) by mouth 2 (two) times daily. 11/20/23   Newlin, Enobong, MD  cyclobenzaprine  (FLEXERIL ) 10 MG tablet Take 1 tablet (10 mg total) by mouth 2 (two) times daily as needed for muscle spasms. TAKE 1 TABLET  BY MOUTH AT BEDTIME AS NEEDED FOR MUSCLE SPASMS 11/20/23   Newlin, Enobong, MD  esomeprazole  (NEXIUM ) 40 MG capsule Take 1 capsule (40 mg total) by mouth 2 (two) times daily before a meal. 12/03/23   Federico Rosario BROCKS, MD  famotidine  (PEPCID ) 40 MG tablet Take 1 tablet (40 mg total) by mouth at bedtime. 08/31/23   McMichael, Nestor HERO, PA-C  fluticasone  (FLONASE ) 50 MCG/ACT nasal spray Place 2 sprays into both  nostrils daily. 10/17/23   Danton Jon HERO, PA-C  gabapentin  (NEURONTIN ) 400 MG capsule TAKE 1 CAPSULE BY MOUTH IN AM AND NOON, AND 2 CAPSULE BY MOUTH AT BEDTIME 12/21/23   Odean Potts, MD  hydrOXYzine  (ATARAX ) 25 MG tablet TAKE 1 TABLET(25 MG) BY MOUTH THREE TIMES DAILY 11/20/23   Newlin, Enobong, MD  ipratropium (ATROVENT ) 0.03 % nasal spray Place into the nose. Patient not taking: Reported on 02/06/2024 12/20/22   [provider]  linaclotide  (LINZESS ) 145 MCG CAPS capsule Take 1 capsule (145 mcg total) by mouth daily before breakfast. Patient taking differently: Take 145 mcg by mouth as needed. 05/19/22   Federico Rosario BROCKS, MD  methimazole  (TAPAZOLE ) 5 MG tablet Take 1 tablet (5 mg total) by mouth as directed. 1 tablet Monday through Thursday, skip rest of the week 10/02/23   Shamleffer, Ibtehal Jaralla, MD  metoCLOPramide  (REGLAN ) 10 MG tablet Take 1 tablet (10 mg total) by mouth every 6 (six) hours. 12/18/23   Melvenia Motto, MD  metoprolol  succinate (TOPROL -XL) 25 MG 24 hr tablet Take 0.5 tablets (12.5 mg total) by mouth daily. For increased heart rate 11/20/23   Newlin, Enobong, MD  naloxone [facility]) nasal spray 4 mg/0.1 mL 1 (ONE) SPRAY AS NEEDED 07/13/23   [provider]  oxyCODONE -acetaminophen  (PERCOCET/ROXICET) 5-325 MG tablet Take 1 tablet by mouth 4 (four) times daily. 07/13/23   [provider]  potassium chloride  (KLOR-CON ) 10 MEQ tablet Take 1 tablet (10 mEq total) by mouth daily. 01/24/24   Newlin, Enobong, MD  rizatriptan  (MAXALT ) 10 MG tablet Take 1 tablet (10 mg total) by mouth as needed for migraine. May repeat in 2 hours if needed 11/20/23   Newlin, Enobong, MD  sucralfate  (CARAFATE ) 1 GM/10ML suspension Take 10 mLs (1 g total) by mouth 4 (four) times daily. Take 2 teaspoons with each meal and 2 teaspoons at bedtime. Do not take within 2 hours of any other medications 08/09/23   Cara Elida HERO, NP  sucralfate  (CARAFATE ) 1 GM/10ML suspension Take 10 mLs (1 g  total) by mouth 4 (four) times daily. 09/17/23 02/06/24  Federico Rosario BROCKS, MD  topiramate  (TOPAMAX ) 50 MG tablet Take 1 tablet (50 mg total) by mouth 2 (two) times daily. 11/20/23   Newlin, Enobong, MD  UNABLE TO FIND Med Name: Lido 2%/Dicyclo/Antacid 113 susp    [provider]  Vitamin D , Ergocalciferol , (DRISDOL) 1.25 MG (50000 UNIT) CAPS capsule Take 50,000 Units by mouth once a week. 07/19/23   [provider]  zolpidem  (AMBIEN ) 5 MG tablet TAKE 1 TABLET BY MOUTH AT BEDTIME AS NEEDED FOR SLEEP. 12/25/23   Odean Potts, MD  Past Surgical History Past Surgical History:  Procedure Laterality Date   AXILLARY SENTINEL NODE BIOPSY Right 12/01/2022   Procedure: RIGHT AXILLARY SENTINEL NODE BIOPSY;  Surgeon: Vernetta Berg, MD;  Location: [facility];  Service: General;  Laterality: Right;   BREAST BIOPSY Right 10/02/2022   MM RT BREAST BX W LOC DEV 1ST LESION IMAGE BX SPEC STEREO GUIDE 10/02/2022 [facility]   BREAST BIOPSY  11/10/2022   MM RT RADIOACTIVE SEED LOC MAMMO GUIDE 11/10/2022 [facility]   BREAST LUMPECTOMY WITH RADIOACTIVE SEED LOCALIZATION Right 11/13/2022   Procedure: RIGHT BREAST LUMPECTOMY WITH RADIOACTIVE SEED LOCALIZATION;  Surgeon: Vernetta Berg, MD;  Location: [facility];  Service: General;  Laterality: Right;   ENDOMETRIAL ABLATION N/A 04/25/2017   Procedure: ENDOMETRIAL ABLATION With NOVASURE;  Surgeon: Eveline Lynwood MATSU, MD;  Location: [facility];  Service: Gynecology;  Laterality: N/A;   ESOPHAGEAL MANOMETRY N/A 07/27/2021   Procedure: ESOPHAGEAL MANOMETRY (EM);  Surgeon: Federico Rosario BROCKS, MD;  Location: [facility];  Service: Gastroenterology;  Laterality: N/A;   PORTACATH PLACEMENT N/A 12/01/2022   Procedure: INSERTION PORT-A-CATH WITH ULTRASOUND GUIDANCE;  Surgeon: Vernetta Berg, MD;   Location: [facility];  Service: General;  Laterality: N/A;   RE-EXCISION OF BREAST CANCER,SUPERIOR MARGINS Right 12/01/2022   Procedure: RE-EXCISION OF RIGHT BREAST CANCER;  Surgeon: Vernetta Berg, MD;  Location: [facility];  Service: General;  Laterality: Right;   TUBAL LIGATION  2003   Family History Family History  Problem Relation Age of Onset   Hypertension Mother    Hypertension Maternal Aunt    Breast cancer Maternal Grandmother        dx > 50   Stomach cancer Maternal Grandfather        dx > 50   Breast cancer Paternal Grandmother        dx > 50   Esophageal cancer Neg Hx    Rectal cancer Neg Hx    Colon cancer Neg Hx     Social History Social History   Tobacco Use   Smoking status: Never   Smokeless tobacco: Never  Vaping Use   Vaping status: Never Used  Substance Use Topics   Alcohol use: No   Drug use: Not Currently    Types: Marijuana    Comment: quit 2018   Allergies Latex and Tramadol   Review of Systems Review of Systems  Physical Exam Vital Signs  I have reviewed the triage vital signs BP (!) 127/90 (BP Location: Right Arm)   Pulse 90   Temp 98 F (36.7 C) (Oral)   Resp 18   SpO2 100%   Physical Exam Vitals and nursing note reviewed.  Eyes:     Pupils: Pupils are equal, round, and reactive to light.  Cardiovascular:     Rate and Rhythm: Normal rate.  Pulmonary:     Effort: Pulmonary effort is normal.  Abdominal:     General: Abdomen is flat. There is no distension.     Palpations: Abdomen is soft.     Tenderness: There is no abdominal tenderness.  Musculoskeletal:        General: Normal range of motion.     Cervical back: Normal range of motion.     Comments: Right knee-no significant swelling, no erythema.  Normal range of motion.  No overlying rash, no crepitus.  No point tenderness.  Skin:    General: Skin is warm.  Neurological:     General: No focal deficit present.  Mental Status: She is alert.     ED Results and  Treatments Labs (all labs ordered are listed, but only abnormal results are displayed) Labs Reviewed  BASIC METABOLIC PANEL WITH GFR - Abnormal; Notable for the following components:      Result Value   Glucose, Bld 117 (*)    All other components within normal limits  CBC WITH DIFFERENTIAL/PLATELET  TROPONIN T, HIGH SENSITIVITY  TROPONIN T, HIGH SENSITIVITY                                                                                                                          Radiology US  Venous Img Lower Right (DVT Study) Result Date: 02/15/2024 EXAM: ULTRASOUND DUPLEX OF THE RIGHT LOWER EXTREMITY VEINS TECHNIQUE: Duplex ultrasound using B-mode/gray scaled imaging and Doppler spectral analysis and color flow was obtained of the deep venous structures of the right lower extremity. COMPARISON: None. CLINICAL HISTORY: Pain. FINDINGS: The visualized veins of the lower extremity are patent and free of echogenic thrombus. The veins demonstrate good compressibility with normal color flow study and spectral analysis. IMPRESSION: 1. No evidence of DVT. Electronically signed by: Evalene Coho MD 02/15/2024 09:40 AM EDT RP Workstation: [id number]   DG Knee 2 Views Right Result Date: 02/15/2024 EXAM: 1 or 2 VIEW(S) XRAY OF THE RIGHT KNEE 02/15/2024 09:27:57 AM COMPARISON: Right knee series dated 01/21/2023. CLINICAL HISTORY: Pain. Knee pain; no reported trauma to right knee. FINDINGS: BONES AND JOINTS: No acute fracture. No focal osseous lesion. No joint dislocation. No significant joint effusion. No significant degenerative changes. SOFT TISSUES: The soft tissues are unremarkable. IMPRESSION: 1. No acute findings. Electronically signed by: Evalene Coho MD 02/15/2024 09:39 AM EDT RP Workstation: [id number]    Pertinent labs & imaging results that were available during my care of the patient were reviewed by me and considered in my medical decision making (see MDM for details).  Medications Ordered  in ED Medications  acetaminophen  (TYLENOL ) tablet 1,000 mg (1,000 mg Oral Given 02/15/24 1002)                                                                                                                                     Procedures Procedures  (including critical care time)  Medical Decision Making / ED Course   This patient presents to the ED for concern of knee pain, this involves an extensive number of treatment  options, and is a complaint that carries with it a high risk of complications and morbidity.  The differential diagnosis includes DVT, arthritis, neuropathy, musculoskeletal pain, overuse injury, less likely ligamentous injury, less likely meniscal injury, less likely septic arthritis, considered gout.  MDM: Knee overall normal in appearance.  With her history of breast cancer, will obtain DVT ultrasound.  Will obtain plain films.  Basic labs ordered.  Lower suspicion for acute infectious cause.  Lower suspicion for trauma.  Patient does have a history of myalgias since her chemotherapy.  May be related.  Will provide her with some Tylenol  here.  Patient also mentions that she has been having some intermittent chest pain over the last several weeks.  I see this documented in her chart as well.  Less likely be ACS but out of abundance of caution we will obtain a troponin and an EKG.  Reassessment 10 AM-negative plain films of the knee, negative DVT ultrasound.  Blood work reviewed, normal renal function, no leukocytosis.  Troponin negative.  Will discharge.  Additional history obtained:  -External records from outside source obtained and reviewed including: Chart review including previous notes, labs, imaging, consultation notes   Lab Tests: -I ordered, reviewed, and interpreted labs.   The pertinent results include:   Labs Reviewed  BASIC METABOLIC PANEL WITH GFR - Abnormal; Notable for the following components:      Result Value   Glucose, Bld 117 (*)    All other  components within normal limits  CBC WITH DIFFERENTIAL/PLATELET  TROPONIN T, HIGH SENSITIVITY  TROPONIN T, HIGH SENSITIVITY      EKG my independent review of the patient's EKG shows no ST segment depressions or elevations, no T wave inversions, no evidence of acute ischemia.  EKG Interpretation Date/Time:  Friday February 15 2024 08:20:07 EDT Ventricular Rate:  83 PR Interval:  197 QRS Duration:  95 QT Interval:  402 QTC Calculation: 473 R Axis:   50  Text Interpretation: Sinus arrhythmia Multiform ventricular premature complexes Anterior infarct, old Confirmed by Mannie Pac [phone]) on 02/15/2024 9:16:40 AM         Imaging Studies ordered: I ordered imaging studies including knee x-ray, DVT ultrasound I independently visualized and interpreted imaging. I agree with the radiologist interpretation   Medicines ordered and prescription drug management: Meds ordered this encounter  Medications   acetaminophen  (TYLENOL ) tablet 1,000 mg    -I have reviewed the patients home medicines and have made adjustments as needed   Cardiac Monitoring: The patient was maintained on a cardiac monitor.  I personally viewed and interpreted the cardiac monitored which showed an underlying rhythm of: Normal sinus rhythm  Social Determinants of Health:  Factors impacting patients care include: Multiple medical comorbidities including history of breast cancer   Reevaluation: After the interventions noted above, I reevaluated the patient and found that they have :improved  Co morbidities that complicate the patient evaluation  Past Medical History:  Diagnosis Date   Allergy    Anxiety    Arthritis    Breast cancer (HCC)    Chlamydia contact, treated    GERD (gastroesophageal reflux disease)    Hyperthyroidism    Personal history of chemotherapy    Personal history of radiation therapy       Dispostion: I considered admission for this patient, however she is appropriate for  discharge     Final Clinical Impression(s) / ED Diagnoses Final diagnoses:  Acute pain of right knee     @PCDICTATION @  Mannie Pac T, DO 02/15/24 1003

## 2024-02-18 ENCOUNTER — Ambulatory Visit (INDEPENDENT_AMBULATORY_CARE_PROVIDER_SITE_OTHER): Admitting: Gastroenterology

## 2024-02-18 ENCOUNTER — Encounter: Payer: Self-pay | Admitting: Gastroenterology

## 2024-02-18 ENCOUNTER — Encounter: Payer: Self-pay | Admitting: *Deleted

## 2024-02-18 VITALS — BP 112/70 | HR 100 | Ht 66.0 in | Wt 201.0 lb

## 2024-02-18 DIAGNOSIS — K5909 Other constipation: Secondary | ICD-10-CM | POA: Diagnosis not present

## 2024-02-18 DIAGNOSIS — K259 Gastric ulcer, unspecified as acute or chronic, without hemorrhage or perforation: Secondary | ICD-10-CM | POA: Diagnosis not present

## 2024-02-18 DIAGNOSIS — K219 Gastro-esophageal reflux disease without esophagitis: Secondary | ICD-10-CM | POA: Diagnosis not present

## 2024-02-18 MED ORDER — LANSOPRAZOLE 30 MG PO CPDR: 30.0000 mg | DELAYED_RELEASE_CAPSULE | Freq: Two times a day (BID) | ORAL | 2 refills | Status: DC

## 2024-02-18 NOTE — Patient Instructions (Signed)
 GERD Recommend GERD diet switch esomeprazole  to lansoprazole  30 mg twice daily Can use famotidine  prn Can use GI cocktail prn No NSAID's  You have been scheduled for an abdominal ultrasound at [facility] (1st floor of hospital) on 02/21/24 at 8:30am. Please arrive 30 minutes prior to your appointment for registration. Make certain not to have anything to eat or drink 6 hours prior to your appointment. Should you need to reschedule your appointment, please contact radiology at [phone]. This test typically takes about 30 minutes to perform.  _______________________________________________________  If your blood pressure at your visit was 140/90 or greater, please contact your primary care physician to follow up on this.  _______________________________________________________  If you are age 62 or older, your body mass index should be between 23-30. Your Body mass index is 32.44 kg/m. If this is out of the aforementioned range listed, please consider follow up with your Primary Care Provider.  If you are age 62 or younger, your body mass index should be between 19-25. Your Body mass index is 32.44 kg/m. If this is out of the aformentioned range listed, please consider follow up with your Primary Care Provider.   ________________________________________________________  The [facility] providers would like to encourage you to use MYCHART to communicate with providers for non-urgent requests or questions.  Due to long hold times on the telephone, sending your provider a message by [facility] may be a faster and more efficient way to get a response.  Please allow 48 business hours for a response.  Please remember that this is for non-urgent requests.  _______________________________________________________  [facility] is using a team-based approach to care.  Your team is made up of your doctor and two to three APPS. Our APPS (Nurse Practitioners and Physician Assistants)  work with your physician to ensure care continuity for you. They are fully qualified to address your health concerns and develop a treatment plan. They communicate directly with your gastroenterologist to care for you. Seeing the Advanced Practice Practitioners on your physician's team can help you by facilitating care more promptly, often allowing for earlier appointments, access to diagnostic testing, procedures, and other specialty referrals.   Thank you for trusting me with your gastrointestinal care. Deanna May, FNP-C

## 2024-02-18 NOTE — Progress Notes (Signed)
 Chief Complaint: follow-up GERD, epigastric pain Primary GI Doctor: Dr. Federico  HPI: Laurie SAILOR. Bell is a 43 year old female with a past medical history of arthritis, anxiety, hyperthyroidism, GERD. Breast cancer initially diagnosed 09/2022 s/p right lumpectomy 11/2022 with chemo 7/22 - 04/10/2023 and radiation 12/18 - 06/27/2023.    Patient last seen in GI office by Elida, NP on 08/09/23 for acid reflux.  01/30/24 patient presented to ED for Chest Pain, Shortness of Breath, and Abdominal Pain.Chemistry and show no acute abnormality.Chest x-ray without acute findings. D-dimer also undetectably low so feel that PE is highly unlikely. Suspect that her symptoms are likely from GERD. Will have her continue her antacids and follow-up with her primary doctor.   Interval History    Patient presents for follow-up for GERD and epigastric pain . She is currently taking esomeprazole  40 mg po twice daily and GI cocktail prn. She states the GI cocktail helps more than PPI therapy. She was on sucralfate  which didn't help so discontinued. She follows strict GERD diet.  Patient tells me she has tried omeprazole  and pantoprazole  in the past with no improvement. Patient not taking NSAIDs.  Patient reports she is following strict GERD diet.  Patient does note she has had increased anxiety and takes hydroxyzine  as needed which she states is short acting.  She notes her symptoms are different than her panic attacks and can distinguish between the 2.  Wt Readings from Last 3 Encounters:  02/18/24 201 lb (91.2 kg)  02/06/24 200 lb 8 oz (90.9 kg)  02/04/24 200 lb 8 oz (90.9 kg)    Past Medical History:  Diagnosis Date   Allergy    Anxiety    Arthritis    Breast cancer (HCC)    Chlamydia contact, treated    GERD (gastroesophageal reflux disease)    Hyperthyroidism    Personal history of chemotherapy    Personal history of radiation therapy     Past Surgical History:  Procedure Laterality Date    AXILLARY SENTINEL NODE BIOPSY Right 12/01/2022   Procedure: RIGHT AXILLARY SENTINEL NODE BIOPSY;  Surgeon: Vernetta Berg, MD;  Location: [facility];  Service: General;  Laterality: Right;   BREAST BIOPSY Right 10/02/2022   MM RT BREAST BX W LOC DEV 1ST LESION IMAGE BX SPEC STEREO GUIDE 10/02/2022 [facility]   BREAST BIOPSY  11/10/2022   MM RT RADIOACTIVE SEED LOC MAMMO GUIDE 11/10/2022 [facility]   BREAST LUMPECTOMY WITH RADIOACTIVE SEED LOCALIZATION Right 11/13/2022   Procedure: RIGHT BREAST LUMPECTOMY WITH RADIOACTIVE SEED LOCALIZATION;  Surgeon: Vernetta Berg, MD;  Location: [facility];  Service: General;  Laterality: Right;   ENDOMETRIAL ABLATION N/A 04/25/2017   Procedure: ENDOMETRIAL ABLATION With NOVASURE;  Surgeon: Eveline Lynwood MATSU, MD;  Location: [facility];  Service: Gynecology;  Laterality: N/A;   ESOPHAGEAL MANOMETRY N/A 07/27/2021   Procedure: ESOPHAGEAL MANOMETRY (EM);  Surgeon: Federico Rosario BROCKS, MD;  Location: [facility];  Service: Gastroenterology;  Laterality: N/A;   PORTACATH PLACEMENT N/A 12/01/2022   Procedure: INSERTION PORT-A-CATH WITH ULTRASOUND GUIDANCE;  Surgeon: Vernetta Berg, MD;  Location: [facility];  Service: General;  Laterality: N/A;   RE-EXCISION OF BREAST CANCER,SUPERIOR MARGINS Right 12/01/2022   Procedure: RE-EXCISION OF RIGHT BREAST CANCER;  Surgeon: Vernetta Berg, MD;  Location: [facility];  Service: General;  Laterality: Right;   TUBAL LIGATION  2003    Current Outpatient Medications  Medication Sig Dispense Refill   acetaminophen  (TYLENOL ) 500 MG tablet Take  1,000 mg by mouth every 6 (six) hours as needed for moderate pain.     albuterol  (VENTOLIN  HFA) 108 (90 Base) MCG/ACT inhaler TAKE 2 PUFFS BY MOUTH EVERY 6 HOURS AS NEEDED FOR WHEEZE OR SHORTNESS OF BREATH 54 each 0   AMBULATORY NON FORMULARY MEDICATION Medication Name: GI Cocktail: 270 mL viscous Xylocaine  2%, 270 mL Dicyclomine 10 mg/5 mL, 810 mL  Mylanta/Maylox. Take 8-10 mL every 4-6 hours as needed for severe pain Dispense 900 mL 900 m 1   amitriptyline  (ELAVIL ) 50 MG tablet Take 1 tablet (50 mg total) by mouth at bedtime. 30 tablet 2   clindamycin  (CLEOCIN ) 300 MG capsule Take 1 capsule (300 mg total) by mouth 2 (two) times daily. 20 capsule 0   cyclobenzaprine  (FLEXERIL ) 10 MG tablet Take 1 tablet (10 mg total) by mouth 2 (two) times daily as needed for muscle spasms. TAKE 1 TABLET BY MOUTH AT BEDTIME AS NEEDED FOR MUSCLE SPASMS 180 tablet 1   esomeprazole  (NEXIUM ) 40 MG capsule Take 1 capsule (40 mg total) by mouth 2 (two) times daily before a meal. 60 capsule 4   famotidine  (PEPCID ) 40 MG tablet Take 1 tablet (40 mg total) by mouth at bedtime. 30 tablet 2   fluticasone  (FLONASE ) 50 MCG/ACT nasal spray Place 2 sprays into both nostrils daily. 16 g 6   gabapentin  (NEURONTIN ) 400 MG capsule TAKE 1 CAPSULE BY MOUTH IN AM AND NOON, AND 2 CAPSULE BY MOUTH AT BEDTIME 56 capsule 0   hydrOXYzine  (ATARAX ) 25 MG tablet TAKE 1 TABLET(25 MG) BY MOUTH THREE TIMES DAILY 270 tablet 1   ipratropium (ATROVENT ) 0.03 % nasal spray Place into the nose.     linaclotide  (LINZESS ) 145 MCG CAPS capsule Take 1 capsule (145 mcg total) by mouth daily before breakfast. (Patient taking differently: Take 145 mcg by mouth as needed.) 30 capsule 5   methimazole  (TAPAZOLE ) 5 MG tablet Take 1 tablet (5 mg total) by mouth as directed. 1 tablet Monday through Thursday, skip rest of the week 52 tablet 3   metoCLOPramide  (REGLAN ) 10 MG tablet Take 1 tablet (10 mg total) by mouth every 6 (six) hours. 30 tablet 0   metoprolol  succinate (TOPROL -XL) 25 MG 24 hr tablet Take 0.5 tablets (12.5 mg total) by mouth daily. For increased heart rate 45 tablet 1   metroNIDAZOLE  (FLAGYL ) 500 MG tablet Take 500 mg by mouth 2 (two) times daily.     naloxone (NARCAN) nasal spray 4 mg/0.1 mL 1 (ONE) SPRAY AS NEEDED     oxyCODONE -acetaminophen  (PERCOCET) 10-325 MG tablet Take 1 tablet by mouth  4 (four) times daily as needed.     potassium chloride  (KLOR-CON ) 10 MEQ tablet Take 1 tablet (10 mEq total) by mouth daily. 90 tablet 1   rizatriptan  (MAXALT ) 10 MG tablet Take 1 tablet (10 mg total) by mouth as needed for migraine. Markies Mowatt repeat in 2 hours if needed 10 tablet 2   topiramate  (TOPAMAX ) 50 MG tablet Take 1 tablet (50 mg total) by mouth 2 (two) times daily. 180 tablet 1   UNABLE TO FIND Med Name: Lido 2%/Dicyclo/Antacid 113 susp     Vitamin D , Ergocalciferol , (DRISDOL) 1.25 MG (50000 UNIT) CAPS capsule Take 50,000 Units by mouth once a week.     zolpidem  (AMBIEN ) 5 MG tablet TAKE 1 TABLET BY MOUTH AT BEDTIME AS NEEDED FOR SLEEP. 30 tablet 3   oxyCODONE -acetaminophen  (PERCOCET/ROXICET) 5-325 MG tablet Take 1 tablet by mouth 4 (four) times daily. (Patient not taking:  Reported on 02/18/2024)     sucralfate  (CARAFATE ) 1 GM/10ML suspension Take 10 mLs (1 g total) by mouth 4 (four) times daily. Take 2 teaspoons with each meal and 2 teaspoons at bedtime. Do not take within 2 hours of any other medications (Patient not taking: Reported on 02/18/2024) 400 mL 0   sucralfate  (CARAFATE ) 1 GM/10ML suspension Take 10 mLs (1 g total) by mouth 4 (four) times daily. (Patient not taking: Reported on 02/18/2024) 1200 mL 0   Current Facility-Administered Medications  Medication Dose Route Frequency Provider Last Rate Last Admin   0.9 %  sodium chloride  infusion  500 mL Intravenous Once Federico Rosario BROCKS, MD       0.9 %  sodium chloride  infusion  500 mL Intravenous Once Federico Rosario BROCKS, MD        Allergies as of 02/18/2024 - Review Complete 02/18/2024  Allergen Reaction Noted   Latex Other (See Comments) 04/18/2017   Tramadol  Other (See Comments) 01/22/2023    Family History  Problem Relation Age of Onset   Hypertension Mother    Hypertension Maternal Aunt    Breast cancer Maternal Grandmother        dx > 50   Stomach cancer Maternal Grandfather        dx > 50   Breast cancer Paternal Grandmother         dx > 50   Esophageal cancer Neg Hx    Rectal cancer Neg Hx    Colon cancer Neg Hx     Review of Systems:    Constitutional: No weight loss, fever, chills, weakness or fatigue HEENT: Eyes: No change in vision               Ears, Nose, Throat:  No change in hearing or congestion Skin: No rash or itching Cardiovascular: No chest pain, chest pressure or palpitations   Respiratory: No SOB or cough Gastrointestinal: See HPI and otherwise negative Genitourinary: No dysuria or change in urinary frequency Neurological: No headache, dizziness or syncope Musculoskeletal: No new muscle or joint pain Hematologic: No bleeding or bruising Psychiatric: No history of depression or anxiety    Physical Exam:  Vital signs: BP 112/70   Pulse 100   Ht 5' 6 (1.676 m)   Wt 201 lb (91.2 kg)   BMI 32.44 kg/m   Constitutional:   Pleasant female appears to be in NAD, Well developed, Well nourished, alert and cooperative Throat: Oral cavity and pharynx without inflammation, swelling or lesion.  Respiratory: Respirations even and unlabored. Lungs clear to auscultation bilaterally.   No wheezes, crackles, or rhonchi.  Cardiovascular: Normal S1, S2. Regular rate and rhythm. No peripheral edema, cyanosis or pallor.  Gastrointestinal:  Soft, nondistended, nontender. No rebound or guarding. Normal bowel sounds. No appreciable masses or hepatomegaly. Rectal:  Not performed.  Msk:  Symmetrical without gross deformities. Without edema, no deformity or joint abnormality.  Neurologic:  Alert and  oriented x4;  grossly normal neurologically.  Skin:   Dry and intact without significant lesions or rashes.  RELEVANT LABS AND IMAGING: CBC    Latest Ref Rng & Units 02/15/2024    8:45 AM 01/30/2024    7:22 PM 12/18/2023    1:38 PM  CBC  WBC 4.0 - 10.5 K/uL 7.7  8.3  5.9   Hemoglobin 12.0 - 15.0 g/dL 87.5  87.4  87.6   Hematocrit 36.0 - 46.0 % 37.0  37.2  36.7   Platelets 150 - 400 K/uL 242  253  240      CMP      Latest Ref Rng & Units 02/15/2024    8:45 AM 01/30/2024    7:22 PM 12/18/2023    3:38 PM  CMP  Glucose 70 - 99 mg/dL 882  93    BUN 6 - 20 mg/dL 11  10    Creatinine 9.55 - 1.00 mg/dL 9.03  9.11    Sodium 864 - 145 mmol/L 138  138    Potassium 3.5 - 5.1 mmol/L 3.7  3.3    Chloride 98 - 111 mmol/L 104  106    CO2 22 - 32 mmol/L 22  21    Calcium  8.9 - 10.3 mg/dL 9.6  9.2    Total Protein 6.5 - 8.1 g/dL   7.3   Total Bilirubin 0.0 - 1.2 mg/dL   0.4   Alkaline Phos 38 - 126 U/L   192   AST 15 - 41 U/L   19   ALT 0 - 44 U/L   18      Lab Results  Component Value Date   TSH 0.69 10/01/2023    12/18/2023 CTAP IMPRESSION: 1. No acute intrathoracic, abdominal, or pelvic pathology. No CT evidence of pulmonary embolism. 2. Stable small lung nodules.  No new nodule. 3. No bowel obstruction. Normal appendix.   CT renal stone study 12/20/16: IMPRESSION: 1. No acute intra-abdominal or pelvic pathology. Specifically there is no hydronephrosis or nephrolithiasis. 2. Moderate colonic stool burden. No bowel obstruction or active inflammation. Normal appendix. 3. Prominent uterus with poorly visualized fibroid.   Barium swallow 08/26/20: IMPRESSION: Moderate gastroesophageal reflux Negative for peptic ulcer disease. 3 mm calcification to the left of L2-3 disc space is new since yesterday. Question symptoms of left renal colic   TTE 08/26/20: IMPRESSIONS   1. Left ventricular ejection fraction, by estimation, is 65 to 70%. The  left ventricle has normal function. The left ventricle has no regional  wall motion abnormalities. Left ventricular diastolic parameters were  normal.   2. Right ventricular systolic function is normal. The right ventricular  size is normal.   3. The mitral valve is normal in structure. Trivial mitral valve  regurgitation.   4. The aortic valve is normal in structure. Aortic valve regurgitation is  not visualized.   CT A/P w/contrast 09/25/21: IMPRESSION: 1.  Known uterine fibroid that appears grossly stable in size better evaluated on pelvic ultrasound 10/08/2016. 2. Hepatomegaly. 3. Constipation. 4. Otherwise no acute intra-abdominal or intrapelvic abnormality.   Speech therapy evaluation 05/27/21: Pt presents with a functional dysphagia that has a significant negative impact on her life - she avoids eating in restaurants; she limits her diet to a narrow selection of foods; she has lost weight.  During today's study, Ms. Shirer self-limited bolus sizes (solid foods ~1/2 tspn; liquids ~ 5 ml) and required encouragement to consume >10 ml sips of liquid or tablespoon sized solid boluses.  She verbalized her concerns about increasing bolus size and became tearful and apologetic and discussed her fears related to swallowing.  Offered encouragement.  Imaging revealed normal biomechanics of the swallow with thorough mastication, timely swallow response, normal pharyngeal squeeze, reliable laryngeal vestibule closure, no penetration/aspiration, no obvious stasis in esophagus upon brief screen.  Ms. Dargis identifed globus sensation during the study. We watched the study together in real time; we discussed the anatomical separation of the airway from the esophagus and discussed that the feeling of pressure when eating is related to  her esophagus, but that her breathing is not compromised.  She was encouraged to record a few frames of the video as affirmation of the normal mechanics of her swallowing.  We discussed recommendations, and Ms. Housman agreed she might benefit from OP SLP to address the functional nature of her dysphagia. Recommend referral to [facility] neuro OP clinic to address the aforementioned condition.   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 - White nummular lesions in esophageal mucosa. Biopsied. - Non- bleeding gastric ulcers with a clean ulcer base ( Forrest Class III) . Biopsied. - Gastritis. Biopsied. - Normal  examined duodenum. Path: 1. Surgical [P], gastric antrum :       -  ANTRAL MUCOSA WITH FEATURES OF BOTH MILD CHRONIC INACTIVE GASTRITIS AND       CHEMICAL/REACTIVE CHANGE.       -  OXYNTIC MUCOSA WITH NO SIGNIFICANT PATHOLOGY.       -  NO HELICOBACTER PYLORI ORGANISMS IDENTIFIED ON H&E STAINED SLIDE.        2. Surgical [P], esophageal :       -  SQUAMOUS MUCOSA WITH NO SIGNIFICANT PATHOLOGY.  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 - Erythematous mucosa in the antrum. - Normal examined duodenum. - No specimens collected.   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 - Non-bleeding gastric ulcers with a clean ulcer base (Forrest Class III). Biopsied. - Normal examined duodenum. - Biopsies were taken with a cold forceps for histology in the proximal esophagus and in the distal esophagus. Path: 1. Surgical [P], gastric antrum and gastric body - GASTRIC ANTRAL MUCOSA WITH MILD NONSPECIFIC REACTIVE GASTROPATHY - GASTRIC OXYNTIC MUCOSA WITH NO SPECIFIC HISTOPATHOLOGIC CHANGES - WARTHIN STARRY STAIN IS NEGATIVE FOR HELICOBACTER PYLORI 2. Surgical [P], distal esophagus - ESOPHAGEAL SQUAMOUS AND CARDIAC MUCOSA WITH REFLUX-ASSOCIATED CHANGES - NEGATIVE FOR INTESTINAL METAPLASIA OR DYSPLASIA 3. 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 - Normal stomach. - Normal examined duodenum. - No specimens collected.   Esophageal manometry 07/27/21: Normal.   Assessment/Plan:  GERD, epigastric pain 09/2023 EGD showed gastric ulcers 11/2023 follow-up EGD showed gastric ulcers had healed on PPI therapy 12/2023 CT scan negative Patient has trialed and failed multiple PPI therapies, will switch to lansoprazole  twice daily --Continue GI cocktail  prn --Continue GERD diet, no late meals --No NSAIDs --Abdominal U/S RUQ r/o gallbladder disease -If no improvement in symptoms Obert Espindola consider 24 hour impedence and/or manometry --recommend following up with behavioral health to discuss uncontrolled anxiety  Breast cancer initially diagnosed 09/2022, s/p right lumpectomy 11/2022 treated with chemo 7/22 - 04/10/2023 and radiation 12/18 - 06/27/2023.    Colon cancer screening  -Screening colonoscopy at the age of 42   Chronic constipation -Linzess  145 mcg 1 tab p.o. daily to be taken 30 minutes before breakfast as needed   Thank you for the courtesy of this consult. Please call me with any questions or concerns.   Donnalyn Juran, FNP-C [facility] 02/18/2024, 2:18 PM  Cc: Newlin, Enobong, MD

## 2024-02-19 ENCOUNTER — Ambulatory Visit

## 2024-02-19 ENCOUNTER — Ambulatory Visit: Attending: Emergency Medicine | Admitting: Emergency Medicine

## 2024-02-19 ENCOUNTER — Encounter: Payer: Self-pay | Admitting: Emergency Medicine

## 2024-02-19 VITALS — BP 144/72 | HR 93 | Ht 66.0 in | Wt 202.0 lb

## 2024-02-19 DIAGNOSIS — Z171 Estrogen receptor negative status [ER-]: Secondary | ICD-10-CM | POA: Diagnosis present

## 2024-02-19 DIAGNOSIS — R079 Chest pain, unspecified: Secondary | ICD-10-CM | POA: Diagnosis present

## 2024-02-19 DIAGNOSIS — I493 Ventricular premature depolarization: Secondary | ICD-10-CM | POA: Diagnosis present

## 2024-02-19 DIAGNOSIS — R002 Palpitations: Secondary | ICD-10-CM | POA: Diagnosis not present

## 2024-02-19 DIAGNOSIS — C50411 Malignant neoplasm of upper-outer quadrant of right female breast: Secondary | ICD-10-CM | POA: Diagnosis not present

## 2024-02-19 MED ORDER — METOPROLOL TARTRATE 100 MG PO TABS: 100.0000 mg | ORAL_TABLET | Freq: Once | ORAL | 0 refills | Status: DC

## 2024-02-19 NOTE — Progress Notes (Signed)
 Cardiology Office Note:    Date:  02/19/2024  ID:  Laurie Bell, DOB 1980-11-08, MRN [id number] PCP: Delbert Clam, MD  [facility] Providers Cardiologist:  None Cardiology APP:  Rana Lum CROME, NP       Patient Profile:       Chief Complaint: Acute visit for palpitations and chest pain History of Present Illness:  Laurie Bell is a 43 y.o. female with visit-pertinent history of palpitations, PVCs, cervical radiculopathy, anxiety, hypothyroidism, right breast cancer s/p right breast lumpectomy with radioactive seed location, chemotherapy and radiation  Patient established with cardiology service on 05/17/2021 for evaluation of palpitations.  She had PVCs noted on prior ECG.  ZIO monitor was ordered showing normal sinus rhythm with frequent PVCs.  PVCs were frequent at 14.4%.  It was considered to start low-dose beta-blocker however patient was lost to follow-up  She had not been seen by cardiology since initial visit.   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 Her most recent mammogram in 12/2023 was normal.   Echocardiogram 12/2022 showed LVEF 60 to 65%, no RWMA, normal diastolic parameters, RV function and size normal, no valvular abnormalities.  Echocardiogram 01/2023 showed LVEF 60 to 65%, no RWMA, normal diastolic parameters, RV function and size normal, no valvular abnormalities.  Since the beginning of 2025 she has had 8 visits to the emergency department for various indications including GERD, chest pain, and palpitations.  During her visit to the ED on 3/20 she presented with palpitations.  Her EKG did show some premature atrial contractions and also some PVCs.  She was to follow-up with cardiology.   Discussed the use of AI scribe software for clinical note transcription with the patient, who gave verbal consent  to proceed.  History of Present Illness Laurie Bell is a 43 year old female with breast cancer and frequent PVCs who presents with chest pain and palpitations.  Since February 2025, she experiences increased frequency of chest pain and palpitations following chemotherapy completion in October 2024 and radiation in January 2025. Chest pain presents as tightness lasting 30 minutes to an hour, sometimes radiating to her back, occasionally with a tightness sensation. Episodes occur daily, sometimes with panic attacks, anxiety, and acid reflux. Physical activity, such as dance class, can trigger the pain.  She continues to experience frequent palpitations.  She has seen an increase in her palpitations over the past several months.  She was prescribed metoprolol  by her PCP earlier this year however has been taking it as needed instead of daily.  She denies any dyspnea, orthopnea, PND, leg swelling, lightheadedness, dizziness, syncope, presyncope, hematochezia, melena.  Review of systems:  Please see the history of present illness. All other systems are reviewed and otherwise negative.      Studies Reviewed:    EKG Interpretation Date/Time:  Tuesday February 19 2024 14:49:35 EDT Ventricular Rate:  93 PR Interval:  178 QRS Duration:  74 QT Interval:  370 QTC Calculation: 460 R Axis:   11  Text Interpretation: Sinus rhythm with frequent Premature ventricular complexes Low voltage QRS Cannot rule out Anteroseptal infarct , age undetermined When compared with ECG of 15-Feb-2024 08:20, PREVIOUS ECG IS PRESENT Confirmed by Rana Lum [id number]) on 02/19/2024 4:25:21 PM    Echocardiogram 01/22/2023 1. Left ventricular ejection fraction, by estimation, is 60 to 65%. The  left ventricle has normal function. The left ventricle  has no regional  wall motion abnormalities. Left ventricular diastolic parameters were  normal.   2. Right ventricular systolic function is normal. The right  ventricular  size is normal.   3. The mitral valve is normal in structure. No evidence of mitral valve  regurgitation. No evidence of mitral stenosis.   4. The aortic valve is normal in structure. Aortic valve regurgitation is  not visualized. No aortic stenosis is present.   5. The inferior vena cava is normal in size with greater than 50%  respiratory variability, suggesting right atrial pressure of 3 mmHg.   Echocardiogram 12/29/2022 1. Left ventricular ejection fraction, by estimation, is 60 to 65%. The  left ventricle has normal function. The left ventricle has no regional  wall motion abnormalities. Left ventricular diastolic parameters were  normal. The average left ventricular  global longitudinal strain is -22.6 %. The global longitudinal strain is  normal.   2. Right ventricular systolic function is normal. The right ventricular  size is normal.   3. The mitral valve is normal in structure. Trivial mitral valve  regurgitation. No evidence of mitral stenosis.   4. The aortic valve was not well visualized. Aortic valve regurgitation  is not visualized. No aortic stenosis is present.   5. The inferior vena cava is normal in size with greater than 50%  respiratory variability, suggesting right atrial pressure of 3 mmHg.   ZIO 12/28/2021   Normal sinus rhythm with frequent PVCs.   Patient symptoms reliably correlated to PVCs.   No sustained arrhythmias.     Patch Wear Time:  8 days and 8 hours (2023-07-23T11:25:21-0400 to 2023-07-31T20:03:58-0400)   Patient had a min HR of 57 bpm, max HR of 160 bpm, and avg HR of 93 bpm. Predominant underlying rhythm was Sinus Rhythm. No Isolated SVEs, SVE Couplets, or SVE Triplets were present. Isolated VEs were frequent (14.4%, [id number]), VE Couplets were rare  (<1.0%, 678), and no VE Triplets were present. Ventricular Bigeminy and Trigeminy were present.   Echocardiogram 08/26/2020  1. Left ventricular ejection fraction, by estimation, is 65  to 70%. The  left ventricle has normal function. The left ventricle has no regional  wall motion abnormalities. Left ventricular diastolic parameters were  normal.   2. Right ventricular systolic function is normal. The right ventricular  size is normal.   3. The mitral valve is normal in structure. Trivial mitral valve  regurgitation.   4. The aortic valve is normal in structure. Aortic valve regurgitation is  not visualized.  Risk Assessment/Calculations:     HYPERTENSION CONTROL Vitals:   02/19/24 1451  BP: (!) 144/72    The patient's blood pressure is elevated above target today.  In order to address the patient's elevated BP: Blood pressure will be monitored at home to determine if medication changes need to be made.           Physical Exam:   VS:  BP (!) 144/72   Pulse 93   Ht 5' 6 (1.676 m)   Wt 202 lb (91.6 kg)   SpO2 97%   BMI 32.60 kg/m    Wt Readings from Last 3 Encounters:  02/19/24 202 lb (91.6 kg)  02/18/24 201 lb (91.2 kg)  02/06/24 200 lb 8 oz (90.9 kg)    GEN: Well nourished, well developed in no acute distress NECK: No JVD; No carotid bruits CARDIAC: RRR, no murmurs, rubs, gallops RESPIRATORY:  Clear to auscultation without rales, wheezing or rhonchi  ABDOMEN: Soft, non-tender,  non-distended EXTREMITIES:  No edema; No acute deformity      Assessment and Plan:  Palpitations PVCs ZIO 12/2021 showing normal sinus rhythm with frequent PVCs at 14.4% burden Echocardiogram 01/2023 with normal LVEF and no valvular abnormalities - EKG showing NSR with frequent PVCs - Has several year history of palpitations which seem to have increased in frequency and duration over the past several months.  Her symptoms described today are similar to that of PVCs correlating with her history - She was started on metoprolol  by her PCP earlier this year but has only been taking this as needed - Plan to start metoprolol  XL 12.5 mg daily instead of as needed - Plan for ZIO  to assess her PVC burden and consider increasing metoprolol  based on results  Chest pain She has had intermittent chest pains now for several years that have increased in frequency starting on 07/2023 after she completed chemotherapy for her breast cancer.  She has both typical and atypical symptoms.  Chest pains occurring both on exertion and at rest.  Described as tightness with radiation to back which lasts 30 minutes to 1 hour Most recent echocardiogram on 01/2023 showed normal LVEF and no RWMA - Plan for coronary CTA for further ischemic evaluation - Metoprolol  per protocol  History of malignant neoplasm of upper-outer quadrant of right breast S/p lumpectomy, adjuvant chemotherapy, and adjuvant radiation therapy - Had normal mammogram on 12/2023 - She is scheduled for MRI on 06/2024 - Management per oncology  Hyperlipidemia LDL 119 on 11/2023 Not currently on statin lowering therapy - Will reassess pending CTA results      Dispo:  Return in about 9 weeks (around 04/22/2024) for [place].  Signed, Lum LITTIE Louis, NP

## 2024-02-19 NOTE — Progress Notes (Signed)
 I agree with the assessment and plan as outlined by Ms. May.

## 2024-02-19 NOTE — Patient Instructions (Signed)
 Medication Instructions:  Your physician recommends that you continue on your current medications as directed. Please refer to the Current Medication list given to you today.  *If you need a refill on your cardiac medications before your next appointment, please call your pharmacy*  Testing/Procedures: ZIO XT- Long Term Monitor Instructions  Your physician has requested you wear a ZIO patch monitor for 7 days.  This is a single patch monitor. [employer] supplies one patch monitor per enrollment. Additional stickers are not available. Please do not apply patch if you will be having a Nuclear Stress Test,  Echocardiogram, Cardiac CT, MRI, or Chest Xray during the period you would be wearing the  monitor. The patch cannot be worn during these tests. You cannot remove and re-apply the  ZIO XT patch monitor.  Billing and Patient Assistance Program Information  We have supplied [employer] with any of your insurance information on file for billing purposes. [employer] offers a sliding scale Patient Assistance Program for patients that do not have  insurance, or whose insurance does not completely cover the cost of the ZIO monitor.  You must apply for the Patient Assistance Program to qualify for this discounted rate.  To apply, please call [employer] at [phone], select option 4, select option 2, ask to apply for  Patient Assistance Program. [place] will ask your household income, and how many people  are in your household. They will quote your out-of-pocket cost based on that information.  [employer] will also be able to set up a 65-month, interest-free payment plan if needed.  Applying the monitor   When you are ready to remove the patch, follow instructions on the last 2 pages of Patient  Logbook. Stick patch monitor onto the last page of Patient Logbook.  Place Patient Logbook in the blue and white box. Use locking tab on box and tape box closed  securely. The blue and white box has prepaid postage on  it. Please place it in the mailbox as  soon as possible. Your physician should have your test results approximately 7 days after the  monitor has been mailed back to [facility].  Call [facility] Customer Care at [phone] if you have questions regarding  your ZIO XT patch monitor. Call them immediately if you see an orange light blinking on your  monitor.  If your monitor falls off in less than 4 days, contact our Monitor department at [phone].  If your monitor becomes loose or falls off after 4 days call [employer] at [phone] for  suggestions on securing your monitor    Your cardiac CT will be scheduled at one of the below locations:    [facility] [address]  [place], [place] [address] [phone]   If scheduled at the Heart and Vascular Tower at [facility] street, please enter the parking lot using the [address] entrance and use the FREE valet service at the patient drop-off area. Enter the building and check-in with registration on the main floor.  Please follow these instructions carefully (unless otherwise directed):  An IV will be required for this test and Nitroglycerin will be given.  Hold all erectile dysfunction medications at least 3 days (72 hrs) prior to test. (Ie viagra, cialis, sildenafil, tadalafil, etc)   On the Night Before the Test: Be sure to Drink plenty of water . Do not consume any caffeinated/decaffeinated beverages or chocolate 12 hours prior to your test. Do not take any antihistamines 12 hours prior to your test.  On the Day of the Test: Drink plenty of water  until 1 hour prior to the test. Do not eat any food 1 hour prior to test. You may take your regular medications prior to the test.  Take metoprolol  (Lopressor ) two hours prior to test. If you take Furosemide/Hydrochlorothiazide/Spironolactone/Chlorthalidone, please HOLD on the morning of the test. Patients who wear a continuous glucose  monitor MUST remove the device prior to scanning. FEMALES- please wear underwire-free bra if available, avoid dresses & tight clothing      After the Test: Drink plenty of water . After receiving IV contrast, you may experience a mild flushed feeling. This is normal. On occasion, you may experience a mild rash up to 24 hours after the test. This is not dangerous. If this occurs, you can take Benadryl  25 mg, Zyrtec, Claritin, or Allegra and increase your fluid intake. (Patients taking Tikosyn should avoid Benadryl , and may take Zyrtec, Claritin, or Allegra) If you experience trouble breathing, this can be serious. If it is severe call 911 IMMEDIATELY. If it is mild, please call our office.  We will call to schedule your test 2-4 weeks out understanding that some insurance companies will need an authorization prior to the service being performed.   For more information and frequently asked questions, please visit our website : [email or web address]  For non-scheduling related questions, please contact the cardiac imaging nurse navigator should you have any questions/concerns: Cardiac Imaging Nurse Navigators Direct Office Dial: [phone]   For scheduling needs, including cancellations and rescheduling, please call [place], [phone].  Follow-Up: At [facility], you and your health needs are our priority.  As part of our continuing mission to provide you with exceptional heart care, our providers are all part of one team.  This team includes your primary Cardiologist (physician) and Advanced Practice Providers or APPs (Physician Assistants and Nurse Practitioners) who all work together to provide you with the care you need, when you need it.  Your next appointment:   8-10 week(s)  Provider:   Lum Louis, NP

## 2024-02-21 ENCOUNTER — Ambulatory Visit (HOSPITAL_COMMUNITY)

## 2024-02-25 ENCOUNTER — Other Ambulatory Visit: Payer: Self-pay

## 2024-02-25 ENCOUNTER — Ambulatory Visit: Attending: Hematology and Oncology | Admitting: Rehabilitation

## 2024-02-25 ENCOUNTER — Encounter: Payer: Self-pay | Admitting: Rehabilitation

## 2024-02-25 DIAGNOSIS — Z171 Estrogen receptor negative status [ER-]: Secondary | ICD-10-CM | POA: Diagnosis present

## 2024-02-25 DIAGNOSIS — M79601 Pain in right arm: Secondary | ICD-10-CM | POA: Insufficient documentation

## 2024-02-25 DIAGNOSIS — C50911 Malignant neoplasm of unspecified site of right female breast: Secondary | ICD-10-CM | POA: Diagnosis present

## 2024-02-25 DIAGNOSIS — Z9189 Other specified personal risk factors, not elsewhere classified: Secondary | ICD-10-CM | POA: Insufficient documentation

## 2024-02-25 DIAGNOSIS — M542 Cervicalgia: Secondary | ICD-10-CM | POA: Insufficient documentation

## 2024-02-25 DIAGNOSIS — Z483 Aftercare following surgery for neoplasm: Secondary | ICD-10-CM | POA: Insufficient documentation

## 2024-02-25 DIAGNOSIS — C50411 Malignant neoplasm of upper-outer quadrant of right female breast: Secondary | ICD-10-CM | POA: Diagnosis not present

## 2024-02-25 DIAGNOSIS — R293 Abnormal posture: Secondary | ICD-10-CM | POA: Insufficient documentation

## 2024-02-25 DIAGNOSIS — M79602 Pain in left arm: Secondary | ICD-10-CM | POA: Diagnosis present

## 2024-02-25 NOTE — Therapy (Signed)
 OUTPATIENT PHYSICAL THERAPY  UPPER EXTREMITY ONCOLOGY EVALUATION  Patient Name: Laurie Bell MRN: [id number] DOB:[birth year], 43 y.o., female Today's Date: 02/25/2024  END OF SESSION:  PT End of Session - 02/25/24 1651     Visit Number 1    Number of Visits 9    Date for PT Re-Evaluation 03/24/24    Authorization Type needed at visit 27    Authorization - Visit Number 5    Authorization - Number of Visits 27    PT Start Time 1600    PT Stop Time 1650    PT Time Calculation (min) 50 min    Activity Tolerance Patient tolerated treatment well    Behavior During Therapy WFL for tasks assessed/performed           Past Medical History:  Diagnosis Date   Allergy    Anxiety    Arthritis    Breast cancer (HCC)    Chlamydia contact, treated    GERD (gastroesophageal reflux disease)    Hyperthyroidism    Personal history of chemotherapy    Personal history of radiation therapy    Past Surgical History:  Procedure Laterality Date   AXILLARY SENTINEL NODE BIOPSY Right 12/01/2022   Procedure: RIGHT AXILLARY SENTINEL NODE BIOPSY;  Surgeon: Vernetta Berg, MD;  Location: [facility];  Service: General;  Laterality: Right;   BREAST BIOPSY Right 10/02/2022   MM RT BREAST BX W LOC DEV 1ST LESION IMAGE BX SPEC STEREO GUIDE 10/02/2022 [facility]   BREAST BIOPSY  11/10/2022   MM RT RADIOACTIVE SEED LOC MAMMO GUIDE 11/10/2022 [facility]   BREAST LUMPECTOMY WITH RADIOACTIVE SEED LOCALIZATION Right 11/13/2022   Procedure: RIGHT BREAST LUMPECTOMY WITH RADIOACTIVE SEED LOCALIZATION;  Surgeon: Vernetta Berg, MD;  Location: [facility];  Service: General;  Laterality: Right;   ENDOMETRIAL ABLATION N/A 04/25/2017   Procedure: ENDOMETRIAL ABLATION With NOVASURE;  Surgeon: Eveline Lynwood MATSU, MD;  Location:  S[facility]ERY C[facility];  Service: Gynecology;  Laterality: N/A;   ESOPHAGEAL MANOMETRY N/A 07/27/2021   Procedure: ESOPHAGEAL MANOMETRY (EM);  Surgeon:  Federico Rosario BROCKS, MD;  Location: [facility];  Service: Gastroenterology;  Laterality: N/A;   PORTACATH PLACEMENT N/A 12/01/2022   Procedure: INSERTION PORT-A-CATH WITH ULTRASOUND GUIDANCE;  Surgeon: Vernetta Berg, MD;  Location: [facility];  Service: General;  Laterality: N/A;   RE-EXCISION OF BREAST CANCER,SUPERIOR MARGINS Right 12/01/2022   Procedure: RE-EXCISION OF RIGHT BREAST CANCER;  Surgeon: Vernetta Berg, MD;  Location: [facility];  Service: General;  Laterality: Right;   TUBAL LIGATION  2003   Patient Active Problem List   Diagnosis Date Noted   PVC's (premature ventricular contractions) 11/28/2023   Palpitations 11/22/2023   Precordial chest pain 11/22/2023   Chronic daily headache 10/08/2023   Trichomonas vaginitis 03/28/2023   Friable cervix 03/27/2023   PCB (post coital bleeding) 03/27/2023   Screening for cervical cancer 03/27/2023   Sepsis (HCC) 02/17/2023   COVID-19 virus infection 02/17/2023   Lower extremity pain, anterior, right 01/22/2023   SIRS (systemic inflammatory response syndrome) (HCC) 01/21/2023   Malignant neoplasm of upper-outer quadrant of right breast in female, estrogen receptor negative (HCC) 10/09/2022   Multinodular goiter 03/22/2022   Hyperthyroidism 03/22/2022   MDD (major depressive disorder), recurrent episode, moderate (HCC) 11/03/2021   GAD (generalized anxiety disorder) 11/03/2021   Neck pain 10/28/2021   Cervical high risk HPV (human papillomavirus) test positive 10/17/2021   Irregular heart rhythm 04/15/2021   Numbness on left side 04/15/2021   Dysphagia  03/17/2021   Hypomagnesemia 02/23/2021   Gastroesophageal reflux disease    Hypokalemia 08/24/2020   Atypical chest pain 08/24/2020   Nausea and vomiting 08/24/2020   Hypocalcemia 08/24/2020   DUB (dysfunctional uterine bleeding) 01/12/2017   Fibroid, uterine 01/12/2017    PCP: Morna Kendall NP  REFERRING PROVIDER: Lauraine Golden, MD  REFERRING DIAG:  Diagnosis  C50.411,Z17.1  (ICD-10-CM) - Malignant neoplasm of upper-outer quadrant of right breast in female, estrogen receptor negative (HCC)    THERAPY DIAG:  Aftercare following surgery for neoplasm  Malignant neoplasm of right breast in female, estrogen receptor negative, unspecified site of breast (HCC)  At risk for lymphedema  Abnormal posture  Neck pain  Pain in left arm  Pain in right arm  ONSET DATE: 10/04/22  Rationale for Evaluation and Treatment: Rehabilitation  SUBJECTIVE:                                                                                                                                                                                           SUBJECTIVE STATEMENT: I am having pain in the neck and shoulders.  They are equal.  I have shooting and numb pains in both of my arms.  I have been having massages 3x in the past month and they didn't really help.  It is worst in the morning and is a bad after work.    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 It measures 1 cm and is located in the upper-inner quadrant. It is ER 30%+, PR>1% +, HER 2 unknown and Ki 67 unknown. She had a right lumpectomy performed on 11/13/2022. 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 She also had several positive margins and is now s/p a Right Re-excision with SLNB on 12/01/2022 with 0/2 LN's.  Completed radiation.   -Pt was treated in 2022 for thoracic outlet syndrome and neck pain with success with DN.    PAIN:  Are you having pain? Yes NPRS scale: 8/10 Pain location: the neck and bil UT  Pain orientation: Rt and Lt  PAIN TYPE: aching and dull and tight  Pain description: constant  Aggravating factors: It has just been staying bad Relieving factors: Tylenol   PRECAUTIONS: None  RED FLAGS: None   WEIGHT BEARING RESTRICTIONS: No  FALLS:  Has patient fallen in last 6 months? No  LIVING ENVIRONMENT: Lives with:  lives with their family and lives with their spouse and 2 kids  OCCUPATION: CNA and Set designer.  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  LEISURE: Nothing   HAND DOMINANCE: right   PRIOR LEVEL OF FUNCTION: Independent  PATIENT GOALS: decrease the neck pain.     OBJECTIVE: Note: Objective measures were completed at Evaluation unless otherwise noted.  COGNITION: Overall cognitive status: Within functional limits for tasks assessed   PALPATION: Pt reports it just feels irritated all dermatomes with testing but able to feel all locations.  +1-2 ttp supraspinatus, UT, anterior shoulder on the Rt. Muscles noted to be more guarded vs very tight with STM.   OBSERVATIONS / OTHER ASSESSMENTS: holding Rt UE with left for comfort  POSTURE: guarded  UPPER EXTREMITY AROM/PROM:  A/PROM RIGHT   baseline  06/21/23 02/25/24  Shoulder extension 55 40 - pain   Shoulder flexion 150 148 - painful 150  Shoulder abduction 173 130- maybe more of an ulnar nn increase in tingling  170  Shoulder internal rotation 67  Behind the back WNL  Shoulder external rotation 110 80 painful Behind the head full    (Blank rows = not tested)  A/PROM LEFT   baseline 02/25/24  Shoulder extension 45   Shoulder flexion 160 150 - pn and shoulder hiking with protracted scapul  Shoulder abduction 180 170 - pn and shoulder hiking   Shoulder internal rotation 67 Behind the back WNL - pn  Shoulder external rotation 105 Behind the head WNL - pn     (Blank rows = not tested)  CERVICAL AROM: Flex: 30 with pain back of neck Ext:  55 with pull anterior chest    Rot R:  60 with pain in Lt UT Rot Lt:  65 with pn in Rt UT  SB R:  22 with pn  SB L:  32 with pain   UPPER EXTREMITY STRENGTH: Grip: Rt: 45# Lt: 19# from 38# on last visits   L-DEX LYMPHEDEMA SCREENING:  NDI:  NECK DISABILITY INDEX  Date: 02/25/24 Score  Pain intensity 4 = The pain is very severe at the moment  2. Personal care (washing, dressing,  etc.) 1 =  I can look after myself normally but it causes extra pain  3. Lifting 5 = I cannot lift or carry anything   4. Reading 2 =  I can read as much as I want with moderate pain in my neck  5. Headaches 3 = I have moderate headaches, which come frequently  6. Concentration 2 = I have a fair degree of difficulty in concentrating when I want to  7. Work 1 =  I can only do my usual work, but no more  8. Driving 3 = I can't drive my car as long as I want because of moderate pain in my neck  9. Sleeping 5 =  My sleep is completely disturbed (5-7 hrs sleepless)   10. Recreation 4 =  I can hardly do any recreation activities because of pain in my neck  Total 29/50   Minimum Detectable Change (90% confidence): 5 points or 10% points  TREATMENT DATE:  02/25/24 Eval performed Education on relaxation, posture, POC, DN and financial obligations - pt okay with this Performance of initial HEP per below with pt performing each x 1 with cueing as needed.    PATIENT EDUCATION:  Education details: per today's note Person educated: Patient Education method: Medical illustrator Education comprehension: verbalized understanding  HOME EXERCISE PROGRAM: Access Code: [email or web address] URL: [email or web address] Date: 02/25/2024 Prepared by: Saddie Raw  Exercises - Seated Scapular Retraction  - 1 x daily - 7 x weekly - 1-3 sets - 10 reps - 5 sec hold - Seated Cervical Retraction  - 1 x daily - 7 x weekly - 1-3 sets - 10 reps - 5 sec hold - Seated Cervical Sidebending Stretch  - 1 x daily - 7 x weekly - 1-3 sets - 10 reps - 20-30 seconds hold - Seated Levator Scapulae Stretch  - 1 x daily - 7 x weekly - 1 sets - 2-3 reps - 20-30 seconds hold - Doorway Pec Stretch at 90 Degrees Abduction  - 1 x daily - 7 x weekly - 1 sets - 2-3 reps - 20-30 seconds  hold  ASSESSMENT:  CLINICAL IMPRESSION: Patient is a 43 y.o. female who was seen today for physical therapy evaluation and treatment for her ongoing neck and left arm pain now having completed cancer treatment.  Pt has a hx of UT/neck pain requiring PT and DN as well as thoracic outlet type symptoms.  These seem to be aggravated now from her cancer treatments. She has pain in the left UT and neck with all Active movements and palpation to all cervical levels in prone.  Her grip strength has decreased by around 10# since last time here.  In addition to neck and UT pain and stiffness she has scapular dyskinesia on the left side with elevation of the scapula with all shoulder movements.  She will benefit from PT including dry  needling to decrease pain and improve posture.     OBJECTIVE IMPAIRMENTS: decreased activity tolerance, decreased knowledge of condition, decreased knowledge of use of DME, decreased ROM, decreased strength, impaired UE functional use, and pain.   ACTIVITY LIMITATIONS: carrying, lifting, sleeping, and reach over head  PARTICIPATION LIMITATIONS: meal prep, cleaning, laundry, and occupation  PERSONAL FACTORS: 1-2 comorbidities: SLNB, radiation are also affecting patient's functional outcome.   REHAB POTENTIAL: Excellent  CLINICAL DECISION MAKING: Evolving/moderate complexity  EVALUATION COMPLEXITY: Moderate  GOALS: Goals reviewed with patient? Yes  SHORT TERM GOALS=LTGs: Target date: 03/24/24  Pt will decrease resting pain in the right neck to 4/10 or less  Baseline: Goal status: INITIAL  2.  Pt will improve her NDI to 21 or less to demonstrate a significant change in neck status.  Baseline:  Goal status: INITIAL  3.  Pt will be ind with final HEP for posture. Baseline:  Goal status: INITIAL   PLAN:  PT FREQUENCY: 2x/week  PT DURATION: 4 weeks   PLANNED INTERVENTIONS: [id number]- PT Re-evaluation, 97110-Therapeutic exercises, 97530- Therapeutic activity,  97112- Neuromuscular re-education, 97535- Self Care, [id number]- Manual therapy, Patient/Family education, Taping, Dry Needling, Therapeutic exercises, Therapeutic activity, and Neuromuscular re-education  PLAN FOR NEXT SESSION: STM bil UT, neck as needed, PROM, left shoulder postural strengthening.  DN on POC if needed after radiation  Raw Saddie SAUNDERS, PT 02/25/2024, 4:52 PM

## 2024-02-29 ENCOUNTER — Ambulatory Visit (HOSPITAL_COMMUNITY)

## 2024-02-29 ENCOUNTER — Ambulatory Visit (HOSPITAL_COMMUNITY): Admission: RE | Admit: 2024-02-29 | Source: Ambulatory Visit

## 2024-03-10 ENCOUNTER — Inpatient Hospital Stay: Attending: Internal Medicine | Admitting: Internal Medicine

## 2024-03-27 ENCOUNTER — Encounter (HOSPITAL_COMMUNITY): Payer: Self-pay

## 2024-03-28 ENCOUNTER — Other Ambulatory Visit (HOSPITAL_COMMUNITY): Payer: Self-pay

## 2024-03-28 ENCOUNTER — Ambulatory Visit (HOSPITAL_COMMUNITY)

## 2024-03-31 ENCOUNTER — Ambulatory Visit: Admitting: Internal Medicine

## 2024-03-31 NOTE — Progress Notes (Deleted)
 Name: Laurie Bell  MRN/ DOB: [id number], 07/20/1980    Age/ Sex: 43 y.o., female    PCP: Delbert Clam, MD   Reason for Endocrinology Evaluation: Low TSH     Date of Initial Endocrinology Evaluation: 12/09/2021    HPI: [name] is a 43 y.o. female with a past medical history of. Breast Ca (Dx4/2024) The patient presented for initial endocrinology clinic visit on 12/09/2021 for consultative assistance with her low TSH.   Patient has been noted with low TSH since September 2012 with a TSH at 0.129u IU/mL, with normal free T4.  TRAb was undetectable Thyroid  ultrasound on 12/23/2021 revealed multinodular goiter with a right superior 1.5 cm nodule meeting FNA criteria  We opted to start methimazole  due to multiple symptoms 11/2021   Mother and maternal aunt with thyroid  disease   SUBJECTIVE:    Today (03/31/24):  Laurie Bell is here for a follow up on MNG and subclinical hyperthyroidism  She continues to follow-up with oncology for right breast cancer, s/p right lumpectomy, received adjuvant chemotherapy  Presented to the ED 08/2023 with dizziness, EKG showed some premature atrial contractions and PVCs, on metoprolol .  Was attributed to anxiety, workup reassuring  She continues with anxiety She denies local neck swelling  No constipation or diarrhea  Continues with  occasional palpitations  Has chronic stable tremors  No Biotin intake    Methimazole  5 mg , 1 tab 4 days a week   HISTORY:  Past Medical History:  Past Medical History:  Diagnosis Date   Allergy    Anxiety    Arthritis    Breast cancer (HCC)    Chlamydia contact, treated    GERD (gastroesophageal reflux disease)    Hyperthyroidism    Personal history of chemotherapy    Personal history of radiation therapy    Past Surgical History:  Past Surgical History:  Procedure Laterality Date   AXILLARY SENTINEL NODE BIOPSY Right 12/01/2022   Procedure: RIGHT AXILLARY SENTINEL NODE BIOPSY;   Surgeon: Vernetta Berg, MD;  Location: [facility];  Service: General;  Laterality: Right;   BREAST BIOPSY Right 10/02/2022   MM RT BREAST BX W LOC DEV 1ST LESION IMAGE BX SPEC STEREO GUIDE 10/02/2022 [facility]   BREAST BIOPSY  11/10/2022   MM RT RADIOACTIVE SEED LOC MAMMO GUIDE 11/10/2022 [facility]   BREAST LUMPECTOMY WITH RADIOACTIVE SEED LOCALIZATION Right 11/13/2022   Procedure: RIGHT BREAST LUMPECTOMY WITH RADIOACTIVE SEED LOCALIZATION;  Surgeon: Vernetta Berg, MD;  Location: [facility];  Service: General;  Laterality: Right;   ENDOMETRIAL ABLATION N/A 04/25/2017   Procedure: ENDOMETRIAL ABLATION With NOVASURE;  Surgeon: Eveline Lynwood MATSU, MD;  Location: [facility];  Service: Gynecology;  Laterality: N/A;   ESOPHAGEAL MANOMETRY N/A 07/27/2021   Procedure: ESOPHAGEAL MANOMETRY (EM);  Surgeon: Federico Rosario BROCKS, MD;  Location: [facility];  Service: Gastroenterology;  Laterality: N/A;   PORTACATH PLACEMENT N/A 12/01/2022   Procedure: INSERTION PORT-A-CATH WITH ULTRASOUND GUIDANCE;  Surgeon: Vernetta Berg, MD;  Location: [facility];  Service: General;  Laterality: N/A;   RE-EXCISION OF BREAST CANCER,SUPERIOR MARGINS Right 12/01/2022   Procedure: RE-EXCISION OF RIGHT BREAST CANCER;  Surgeon: Vernetta Berg, MD;  Location: [facility] OR;  Service: General;  Laterality: Right;   TUBAL LIGATION  2003    Social History:  reports that she has never smoked. She has never used smokeless tobacco. She reports that she does not currently use drugs after having used the following  drugs: Marijuana. She reports that she does not drink alcohol. Family History: family history includes Breast cancer in her maternal grandmother and paternal grandmother; Hypertension in her maternal aunt and mother; Stomach cancer in her maternal grandfather.   HOME MEDICATIONS: Allergies as of 03/31/2024       Reactions   Latex Other (See Comments)   Burn in vaginal area with latex  condoms   Tramadol  Other (See Comments)   States it messes me up no further info        Medication List        Accurate as of March 31, 2024  7:32 AM. If you have any questions, ask your nurse or doctor.          acetaminophen  500 MG tablet Commonly known as: TYLENOL  Take 1,000 mg by mouth every 6 (six) hours as needed for moderate pain.   albuterol  108 (90 Base) MCG/ACT inhaler Commonly known as: VENTOLIN  HFA TAKE 2 PUFFS BY MOUTH EVERY 6 HOURS AS NEEDED FOR WHEEZE OR SHORTNESS OF BREATH   AMBULATORY NON FORMULARY MEDICATION Medication Name: GI Cocktail: 270 mL viscous Xylocaine  2%, 270 mL Dicyclomine 10 mg/5 mL, 810 mL Mylanta/Maylox. Take 8-10 mL every 4-6 hours as needed for severe pain Dispense 900 mL   amitriptyline  50 MG tablet Commonly known as: ELAVIL  Take 1 tablet (50 mg total) by mouth at bedtime.   clindamycin  300 MG capsule Commonly known as: Cleocin  Take 1 capsule (300 mg total) by mouth 2 (two) times daily.   cyclobenzaprine  10 MG tablet Commonly known as: FLEXERIL  Take 1 tablet (10 mg total) by mouth 2 (two) times daily as needed for muscle spasms. TAKE 1 TABLET BY MOUTH AT BEDTIME AS NEEDED FOR MUSCLE SPASMS   famotidine  40 MG tablet Commonly known as: PEPCID  Take 1 tablet (40 mg total) by mouth at bedtime.   fluticasone  50 MCG/ACT nasal spray Commonly known as: FLONASE  Place 2 sprays into both nostrils daily.   gabapentin  400 MG capsule Commonly known as: NEURONTIN  TAKE 1 CAPSULE BY MOUTH IN AM AND NOON, AND 2 CAPSULE BY MOUTH AT BEDTIME   hydrOXYzine  25 MG tablet Commonly known as: ATARAX  TAKE 1 TABLET(25 MG) BY MOUTH THREE TIMES DAILY   ipratropium 0.03 % nasal spray Commonly known as: ATROVENT  Place into the nose.   lansoprazole  30 MG capsule Commonly known as: PREVACID  Take 1 capsule (30 mg total) by mouth 2 (two) times daily before a meal.   linaclotide  145 MCG Caps capsule Commonly known as: Linzess  Take 1 capsule (145 mcg  total) by mouth daily before breakfast. What changed:  when to take this reasons to take this   methimazole  5 MG tablet Commonly known as: TAPAZOLE  Take 1 tablet (5 mg total) by mouth as directed. 1 tablet Monday through Thursday, skip rest of the week   metoCLOPramide  10 MG tablet Commonly known as: REGLAN  Take 1 tablet (10 mg total) by mouth every 6 (six) hours.   metoprolol  succinate 25 MG 24 hr tablet Commonly known as: TOPROL -XL Take 0.5 tablets (12.5 mg total) by mouth daily. For increased heart rate   metoprolol  tartrate 100 MG tablet Commonly known as: LOPRESSOR  Take 1 tablet (100 mg total) by mouth once for 1 dose.   metroNIDAZOLE  500 MG tablet Commonly known as: FLAGYL  Take 500 mg by mouth 2 (two) times daily.   naloxone 4 MG/0.1ML Liqd nasal spray kit Commonly known as: NARCAN 1 (ONE) SPRAY AS NEEDED   oxyCODONE -acetaminophen  5-325 MG tablet Commonly  known as: PERCOCET/ROXICET Take 1 tablet by mouth 4 (four) times daily.   oxyCODONE -acetaminophen  10-325 MG tablet Commonly known as: PERCOCET Take 1 tablet by mouth 4 (four) times daily as needed.   potassium chloride  10 MEQ tablet Commonly known as: KLOR-CON  Take 1 tablet (10 mEq total) by mouth daily.   rizatriptan  10 MG tablet Commonly known as: Maxalt  Take 1 tablet (10 mg total) by mouth as needed for migraine. May repeat in 2 hours if needed   sucralfate  1 GM/10ML suspension Commonly known as: CARAFATE  Take 10 mLs (1 g total) by mouth 4 (four) times daily. Take 2 teaspoons with each meal and 2 teaspoons at bedtime. Do not take within 2 hours of any other medications   sucralfate  1 GM/10ML suspension Commonly known as: CARAFATE  Take 10 mLs (1 g total) by mouth 4 (four) times daily.   topiramate  50 MG tablet Commonly known as: Topamax  Take 1 tablet (50 mg total) by mouth 2 (two) times daily.   UNABLE TO FIND Med Name: Lido 2%/Dicyclo/Antacid 113 susp   Vitamin D  (Ergocalciferol ) 1.25 MG (50000  UNIT) Caps capsule Commonly known as: DRISDOL Take 50,000 Units by mouth once a week.   zolpidem  5 MG tablet Commonly known as: AMBIEN  TAKE 1 TABLET BY MOUTH AT BEDTIME AS NEEDED FOR SLEEP.          REVIEW OF SYSTEMS: A comprehensive ROS was conducted with the patient and is negative except as per HPI    OBJECTIVE:  VS: There were no vitals taken for this visit.   Wt Readings from Last 3 Encounters:  02/19/24 202 lb (91.6 kg)  02/18/24 201 lb (91.2 kg)  02/06/24 200 lb 8 oz (90.9 kg)     EXAM: General: Pt appears well and is in NAD  Neck: General: Supple without adenopathy. Thyroid : Right thyroid  asymmetry noted but unable to appreciate nodules per se  Lungs: Clear with good BS bilat   Heart: Auscultation: RRR.  Extremities:  BL LE: No pretibial edema normal ROM and strength.  Mental Status: Judgment, insight: Intact Orientation: Oriented to time, place, and person Mood and affect: No depression, anxiety, or agitation     DATA REVIEWED:  Latest Reference Range & Units 10/01/23 11:33  TSH mIU/L 0.69  T4,Free(Direct) 0.8 - 1.8 ng/dL 1.0     Latest Reference Range & Units 03/27/23 12:37  Sodium 135 - 145 mmol/L 140  Potassium 3.5 - 5.1 mmol/L 3.5  Chloride 98 - 111 mmol/L 106  CO2 22 - 32 mmol/L 26  Glucose 70 - 99 mg/dL 894 (H)  BUN 6 - 20 mg/dL 12  Creatinine 9.55 - 8.99 mg/dL 9.22  Calcium  8.9 - 10.3 mg/dL 9.6  Anion gap 5 - 15  8  Alkaline Phosphatase 38 - 126 U/L 114  Albumin 3.5 - 5.0 g/dL 4.2  AST 15 - 41 U/L 37  ALT 0 - 44 U/L 71 (H)  Total Protein 6.5 - 8.1 g/dL 7.3  Total Bilirubin 0.3 - 1.2 mg/dL 0.6  GFR, Est Non African American >60 mL/min >60     Thyroid  ultrasound 10/23/2022 Estimated total number of nodules >/= 1 cm: 2   Number of spongiform nodules >/=  2 cm not described below (TR1): 0   Number of mixed cystic and solid nodules >/= 1.5 cm not described below (TR2): 0   _________________________________________________________    Nodule labeled 1 is a solid hypoechoic nodule with lobulated/irregular margins and focus of macrocalcification (TR 5) in the mid  right thyroid  lobe measuring 2.0 x 1.5 x 1.2 cm, previously measuring up to 1.5 cm. It appears slightly enlarged. **Given size (>/= 1.0 cm) and appearance, fine needle aspiration of this highly suspicious nodule should be considered based on TI-RADS criteria.   Nodule labeled 2 appears to be best described as a solid hypoechoic nodule with punctate echogenic foci (TR 5) in the inferior right thyroid  lobe that measures 0.7 x 0.6 x 0.5 cm. It demonstrates more discrete features on today's exam, and has been upgraded to TR 5. *Given size (>/= 0.5 - 0.9 cm) and appearance, a follow-up ultrasound in 1 year should be considered based on TI-RADS criteria.   Nodule labeled 3 appears to be best described as a solid hypoechoic nodule with irregular/lobulated margins (TR 4) in the inferior right thyroid  lobe measuring 1.1 x 0.8 x 0.6 cm. It again demonstrates more discrete features on today's exam, and has been upgraded to TR 4. *Given size (>/= 1 - 1.4 cm) and appearance, a follow-up ultrasound in 1 year should be considered based on TI-RADS criteria.   IMPRESSION: 1. Multinodular thyroid  gland. Several nodules demonstrate more discrete features on today's exam, and have been upgraded with corresponding recommendations as detailed. 2. Nodule labeled 1 in the mid right thyroid  lobe (2.0 cm TR 5) appears to demonstrate interval enlargement, and continues to meet criteria for biopsy as previously recommended. 3. Nodules labeled 2 and 3, both in the inferior right thyroid  lobe, demonstrate more conspicuous features on today's exam, now warranting follow-up ultrasound in 1 year. A total follow-up interval of 5 years is recommended.    FNA right mid nodule 05/28/2023  Clinical History: Nodule labeled 1 is a solid hypoechoic nodule with  lobulated/irregular margins  and focus of macrocalcifications (TR 5) in  the mid right thyroid  measuring 2.0 x 1.5 x 1.2 cm.  Specimen Submitted:  A. THYROID , RT MID, FINE NEEDLE ASPIRATION    FINAL MICROSCOPIC DIAGNOSIS:  - Benign follicular nodule (Bethesda category II)     ASSESSMENT/PLAN/RECOMMENDATIONS:   Subclinical Hyperthyroidism:  -She was started on methimazole  in June 2023 due to multiple symptoms - Patient is clinically euthyroid - TFTs remain normal, no change  Medications : Continue methimazole  5 mg , 4 days a week ( skips Friday, Saturday and Sundays)     2. Multinodular Goiter:  - No local neck symptoms  -Thyroid  ultrasound on 12/23/2021 revealed multinodular goiter with a right superior 1.5 cm nodule meeting FNA criteria - She is S/P benign FNA of the right mid 2.0 cm nodule 05/2023 - Will order repeat ultrasound by next visit   Follow-up in 6 months  Signed electronically by: Stefano Redgie Butts, MD  [facility] Endocrinology  [facility] [address] [place], [place] [address] Phone: [phone] FAX: [phone]   CC: Delbert Clam, MD [address] [place] [address] [place] [address] Phone: [phone] Fax: [phone]   Return to Endocrinology clinic as below: Future Appointments  Date Time Provider Department Center  03/31/2024  1:20 PM Raybon Conard, Donell Redgie, MD [facility] None  04/21/2024 11:20 AM Rana Lum CROME, NP [facility]  05/21/2024  3:30 PM Delbert Clam, MD [facility] [address]  06/16/2024  8:50 AM [facility] 2 [facility] [address]  07/18/2024  8:40 AM Crawford Morna Pickle, NP [facility] None  02/05/2025 10:00 AM [name], Morna Pickle, NP [facility] None

## 2024-04-04 ENCOUNTER — Other Ambulatory Visit: Payer: Self-pay | Admitting: Gastroenterology

## 2024-04-10 ENCOUNTER — Encounter (HOSPITAL_COMMUNITY): Payer: Self-pay

## 2024-04-14 ENCOUNTER — Ambulatory Visit (HOSPITAL_COMMUNITY): Admission: RE | Admit: 2024-04-14 | Source: Ambulatory Visit

## 2024-04-17 ENCOUNTER — Telehealth (HOSPITAL_COMMUNITY): Payer: Self-pay | Admitting: *Deleted

## 2024-04-17 NOTE — Telephone Encounter (Signed)
Attempted to call patient regarding upcoming cardiac CT appointment. Unable to leave a message.  Jelani Trueba RN Navigator Cardiac Imaging [facility] [phone] Office [phone] Cell  

## 2024-04-18 ENCOUNTER — Ambulatory Visit (HOSPITAL_COMMUNITY)
Admission: RE | Admit: 2024-04-18 | Discharge: 2024-04-18 | Disposition: A | Discharge status: Home / Self Care | Source: Ambulatory Visit | Attending: Emergency Medicine | Admitting: Emergency Medicine

## 2024-04-18 ENCOUNTER — Encounter: Payer: Self-pay | Admitting: *Deleted

## 2024-04-18 DIAGNOSIS — R079 Chest pain, unspecified: Secondary | ICD-10-CM | POA: Insufficient documentation

## 2024-04-18 MED ORDER — NITROGLYCERIN 0.4 MG SL SUBL
0.8000 mg | SUBLINGUAL_TABLET | Freq: Once | SUBLINGUAL | Status: AC
  Administered 2024-04-18: 0.8 mg via SUBLINGUAL

## 2024-04-18 MED ORDER — IOHEXOL 350 MG/ML SOLN
100.0000 mL | Freq: Once | INTRAVENOUS | Status: AC | PRN
  Administered 2024-04-18: 100 mL via INTRAVENOUS

## 2024-04-21 ENCOUNTER — Ambulatory Visit: Payer: Self-pay | Admitting: Emergency Medicine

## 2024-04-21 ENCOUNTER — Ambulatory Visit: Admitting: Emergency Medicine

## 2024-04-21 ENCOUNTER — Other Ambulatory Visit (HOSPITAL_COMMUNITY): Payer: Self-pay

## 2024-04-21 NOTE — Progress Notes (Deleted)
 Cardiology Office Note:    Date:  04/21/2024  ID:  Laurie Bell, DOB 11-10-1980, MRN [id number] PCP: Delbert Clam, MD  [facility] Providers Cardiologist:  None Cardiology APP:  Rana Lum CROME, NP { Click to update primary MD,subspecialty MD or APP then REFRESH:1}    {Click to Open Review  :1}   Patient Profile:       Chief Complaint: *** History of Present Illness:  Laurie Bell is a 43 y.o. female with visit-pertinent history of palpitations, PVCs, cervical radiculopathy, anxiety, hypothyroidism, right breast cancer s/p right breast lumpectomy with radioactive seed location, chemotherapy and radiation   Patient established with cardiology service on 05/17/2021 for evaluation of palpitations.  She had PVCs noted on prior ECG.  ZIO monitor was ordered showing normal sinus rhythm with frequent PVCs.  PVCs were frequent at 14.4%.  It was considered to start low-dose beta-blocker however patient was lost to follow-up   She had not been seen by cardiology since initial visit.    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 Her most recent mammogram in 12/2023 was normal.    Echocardiogram 12/2022 showed LVEF 60 to 65%, no RWMA, normal diastolic parameters, RV function and size normal, no valvular abnormalities.   Echocardiogram 01/2023 showed LVEF 60 to 65%, no RWMA, normal diastolic parameters, RV function and size normal, no valvular abnormalities.   Since the beginning of 2025 she has had 8 visits to the emergency department for various indications including GERD, chest pain, and palpitations.  During her visit to the ED on 3/20 she presented with palpitations.  Her EKG did show some premature atrial contractions and also some PVCs.  She was to follow-up with cardiology.  She was last seen in clinic on 02/19/2024.  Reported  increased frequency and duration of palpitations over the past several months.  She had been started on metoprolol  by her PCP earlier this year but had only been taking it as needed.  She was instructed to start metoprolol  12.5 mg daily.  She also reported intermittent chest pains now for several years that have increased in frequency after she completed chemotherapy for her history of breast cancer.  She had both typical and atypical symptoms.  Discussed the use of AI scribe software for clinical note transcription with the patient, who gave verbal consent to proceed.  History of Present Illness     Review of systems:  Please see the history of present illness. All other systems are reviewed and otherwise negative. ***      Studies Reviewed:        ***  Risk Assessment/Calculations:   {Does this patient have ATRIAL FIBRILLATION?[phone]} No BP recorded.  {Refresh Note OR Click here to enter BP  :1}***        Physical Exam:   VS:  There were no vitals taken for this visit.   Wt Readings from Last 3 Encounters:  02/19/24 202 lb (91.6 kg)  02/18/24 201 lb (91.2 kg)  02/06/24 200 lb 8 oz (90.9 kg)    GEN: Well nourished, well developed in no acute distress NECK: No JVD; No carotid bruits CARDIAC: ***RRR, no murmurs, rubs, gallops RESPIRATORY:  Clear to auscultation without rales, wheezing or rhonchi  ABDOMEN: Soft, non-tender, non-distended EXTREMITIES:  No edema; No acute deformity ***      Assessment and Plan:   Assessment & Plan      {  Are you ordering a CV Procedure (e.g. stress test, cath, DCCV, TEE, etc)?   Press F2        :[id number]}  Dispo:  No follow-ups on file.  Signed, Lum LITTIE Louis, NP

## 2024-04-24 ENCOUNTER — Other Ambulatory Visit: Payer: Self-pay

## 2024-04-24 ENCOUNTER — Other Ambulatory Visit: Payer: Self-pay | Admitting: Gastroenterology

## 2024-04-24 DIAGNOSIS — K219 Gastro-esophageal reflux disease without esophagitis: Secondary | ICD-10-CM

## 2024-04-24 NOTE — Progress Notes (Signed)
 Guardant Reveal testing was offered via mobile phlebotomy draw. Patient has been unresponsive to mobile phlebotomy outreach attempts.  Plan:  Continue to monitor for patient response.  Consider alternative arrangements (in-clinic draw, rescheduling, or additional outreach methods) if testing remains indicated.  Communicate with ordering provider regarding next steps.

## 2024-05-21 ENCOUNTER — Ambulatory Visit: Admitting: Family Medicine

## 2024-05-21 ENCOUNTER — Telehealth: Payer: Self-pay | Admitting: Family Medicine

## 2024-05-21 NOTE — Telephone Encounter (Signed)
 Patient was contacted regarding missed appointment on 05/21/2024. Unable to leave voicemail.

## 2024-06-08 IMAGING — DX DG CHEST 1V
1 series · 1 of 1 positions shown · non-contrast
Comparison: June 09, 2021

CLINICAL DATA: [id number] chest pain and shortness of breath since this
a.m.

EXAM:
CHEST  1 VIEW

[chest pa]
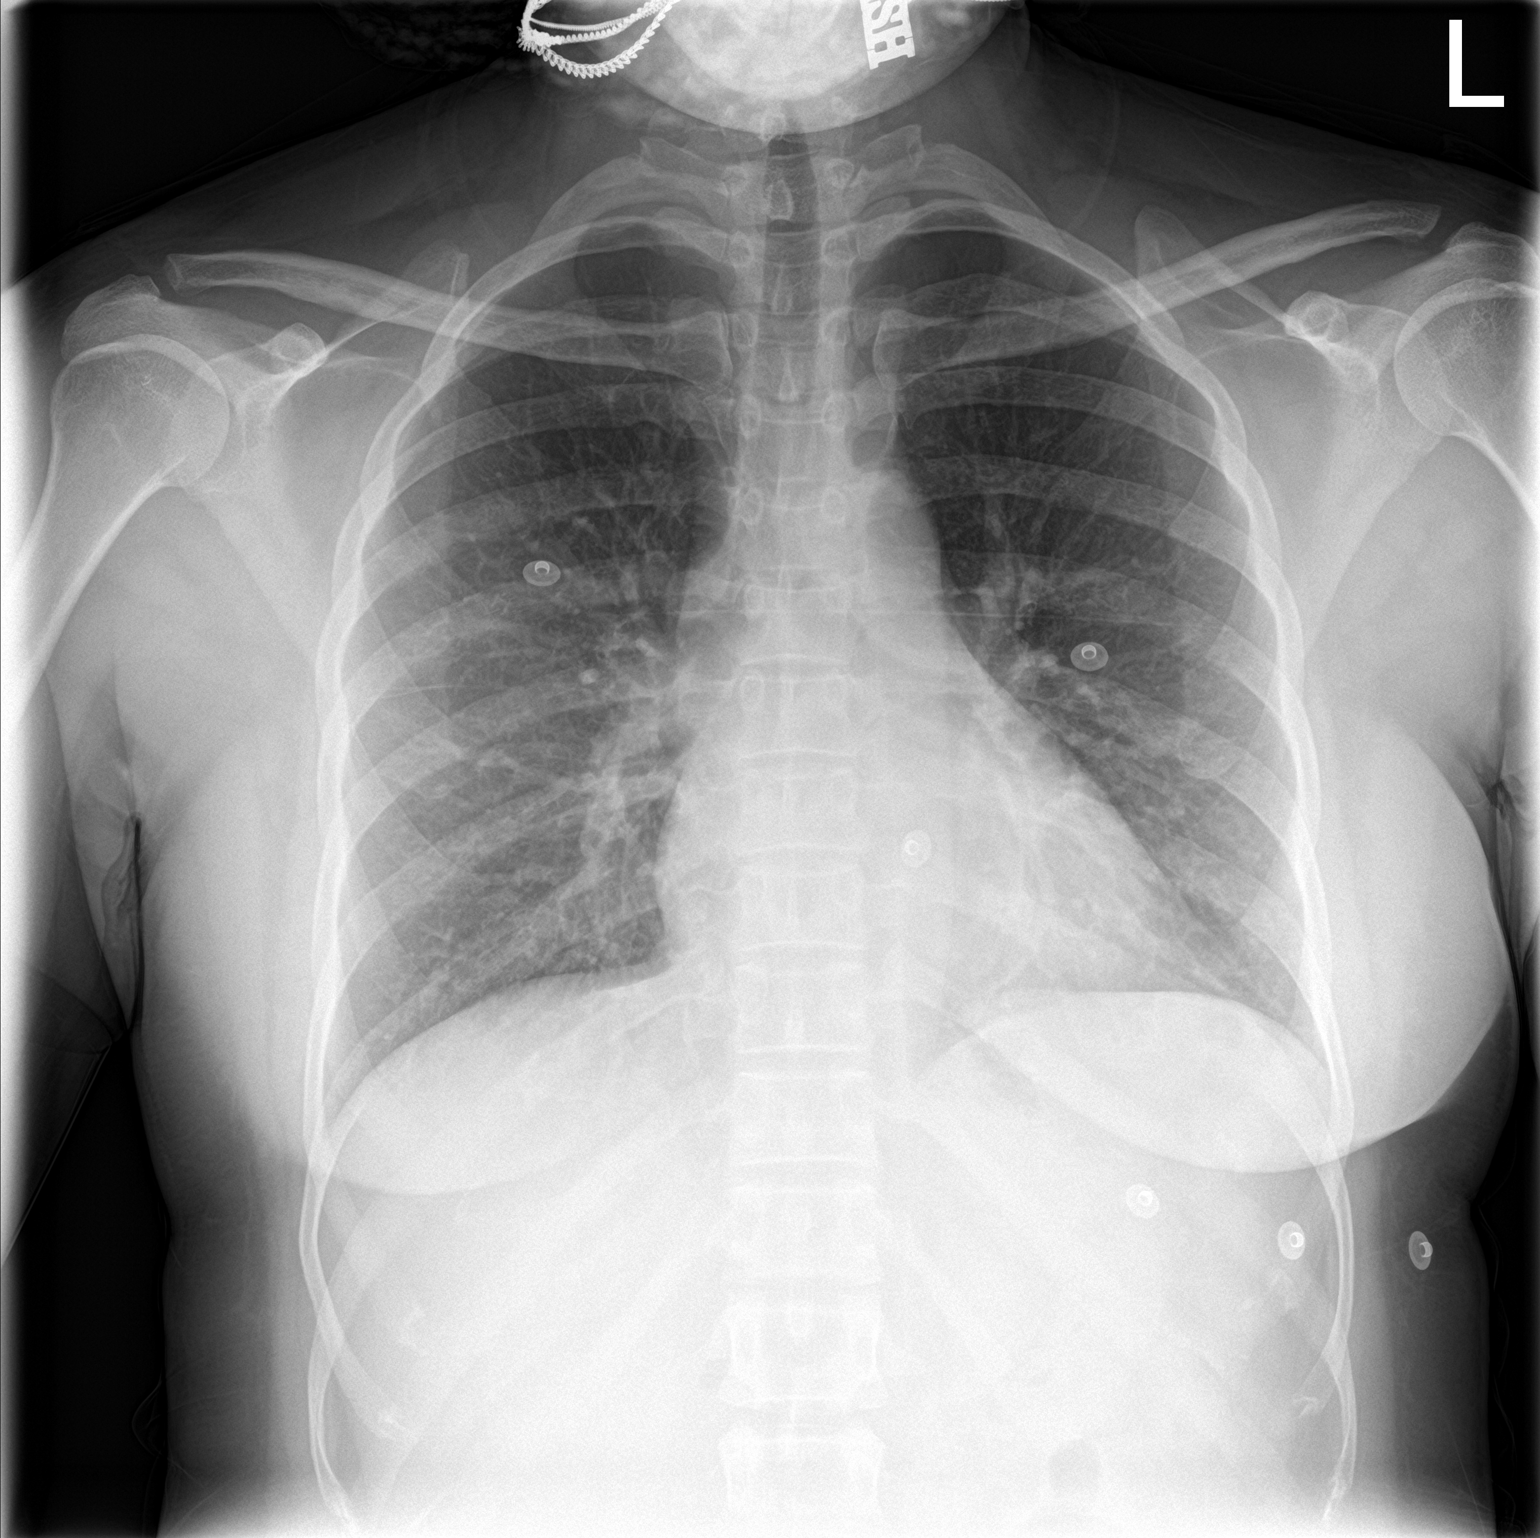

[1 of 1 positions shown; findings below may reference images not displayed]

FINDINGS: The heart size and mediastinal contours are within normal limits.
Both lungs are clear. The visualized skeletal structures are
unremarkable.
IMPRESSION: No active disease.

## 2024-06-11 ENCOUNTER — Other Ambulatory Visit: Payer: Self-pay | Admitting: Hematology and Oncology

## 2024-06-11 ENCOUNTER — Other Ambulatory Visit: Payer: Self-pay

## 2024-06-11 ENCOUNTER — Other Ambulatory Visit: Payer: Self-pay | Admitting: Internal Medicine

## 2024-06-13 ENCOUNTER — Encounter: Payer: Self-pay | Admitting: Internal Medicine

## 2024-06-13 ENCOUNTER — Telehealth: Payer: Self-pay

## 2024-06-13 ENCOUNTER — Encounter: Payer: Self-pay | Admitting: Hematology and Oncology

## 2024-06-13 ENCOUNTER — Other Ambulatory Visit (HOSPITAL_COMMUNITY): Payer: Self-pay

## 2024-06-13 ENCOUNTER — Other Ambulatory Visit: Payer: Self-pay

## 2024-06-13 ENCOUNTER — Other Ambulatory Visit: Payer: Self-pay | Admitting: Gastroenterology

## 2024-06-13 DIAGNOSIS — K219 Gastro-esophageal reflux disease without esophagitis: Secondary | ICD-10-CM

## 2024-06-13 MED ORDER — ESOMEPRAZOLE MAGNESIUM 40 MG PO CPDR
40.0000 mg | DELAYED_RELEASE_CAPSULE | Freq: Two times a day (BID) | ORAL | 4 refills | Status: AC
  Filled 2024-06-13: qty 60, 30d supply, fill #0
  Filled 2024-07-15: qty 60, 30d supply, fill #1

## 2024-06-13 MED ORDER — LANSOPRAZOLE 30 MG PO CPDR: 30.0000 mg | DELAYED_RELEASE_CAPSULE | Freq: Two times a day (BID) | ORAL | 1 refills | Status: DC

## 2024-06-13 MED ORDER — ZOLPIDEM TARTRATE 5 MG PO TABS: 5.0000 mg | ORAL_TABLET | Freq: Every evening | ORAL | 3 refills | Status: AC | PRN

## 2024-06-13 NOTE — Telephone Encounter (Signed)
 Called patient to clarify which prescription was denied by insurance. The denial was for lansoprazole , with the note: Alternative requested: insurance will not cover lansoprazole  without step therapy. This was not related to her Ambien  request. Explained this to the patient, and she acknowledged she had the wrong information and will contact her GI team.

## 2024-06-13 NOTE — Telephone Encounter (Signed)
 Refill request submitted through MyChart for Zolpidem  (Ambien ) 5 mg tablets.

## 2024-06-16 ENCOUNTER — Telehealth: Payer: Self-pay

## 2024-06-16 ENCOUNTER — Other Ambulatory Visit

## 2024-06-16 ENCOUNTER — Other Ambulatory Visit (HOSPITAL_COMMUNITY): Payer: Self-pay

## 2024-06-16 NOTE — Telephone Encounter (Signed)
 Pharmacy Patient Advocate Encounter   Received notification from RX Request Messages that prior authorization for Lansoprazole  30MG  dr capsules is required/requested.   Insurance verification completed.   The patient is insured through [employer].   Per test claim: PA required; PA submitted to above mentioned insurance via Latent Key/confirmation #/EOC [id number] Status is pending

## 2024-06-16 NOTE — Telephone Encounter (Signed)
 PA request has been Submitted. New Encounter has been or will be created for follow up. For additional info see Pharmacy Prior Auth telephone encounter from 06-16-2024.

## 2024-06-17 ENCOUNTER — Encounter: Payer: Self-pay | Admitting: Hematology and Oncology

## 2024-06-19 ENCOUNTER — Other Ambulatory Visit (HOSPITAL_COMMUNITY): Payer: Self-pay

## 2024-06-19 NOTE — Telephone Encounter (Signed)
 See mychart note 06/14/23.

## 2024-06-19 NOTE — Telephone Encounter (Signed)
 Pharmacy Patient Advocate Encounter  Received notification from [employer] that Prior Authorization for Lansoprazole  30MG  dr capsules has been APPROVED from 06-17-2024 to 06-17-2025. Ran test claim, Copay is $4.00 for 30 day supply. This test claim was processed through [facility]- copay amounts may vary at other pharmacies due to pharmacy/plan contracts, or as the patient moves through the different stages of their insurance plan.   PA #/Case ID/Reference #: [id number]

## 2024-06-20 ENCOUNTER — Telehealth: Payer: Self-pay | Admitting: Adult Health

## 2024-06-20 NOTE — Telephone Encounter (Signed)
 I spoke with patient and she is aware of rescheduled SCP appointment from 07/18/2024 to 07/15/2024.

## 2024-06-23 ENCOUNTER — Emergency Department (HOSPITAL_COMMUNITY)
Admission: EM | Admit: 2024-06-23 | Discharge: 2024-06-24 | Disposition: A | Attending: Emergency Medicine | Admitting: Emergency Medicine

## 2024-06-23 ENCOUNTER — Other Ambulatory Visit: Payer: Self-pay

## 2024-06-23 ENCOUNTER — Encounter (HOSPITAL_COMMUNITY): Payer: Self-pay | Admitting: Emergency Medicine

## 2024-06-23 ENCOUNTER — Emergency Department (HOSPITAL_COMMUNITY)

## 2024-06-23 DIAGNOSIS — R0789 Other chest pain: Secondary | ICD-10-CM | POA: Diagnosis present

## 2024-06-23 DIAGNOSIS — Z91040 Latex allergy status: Secondary | ICD-10-CM | POA: Insufficient documentation

## 2024-06-23 DIAGNOSIS — E876 Hypokalemia: Secondary | ICD-10-CM | POA: Diagnosis not present

## 2024-06-23 DIAGNOSIS — Z853 Personal history of malignant neoplasm of breast: Secondary | ICD-10-CM | POA: Insufficient documentation

## 2024-06-23 DIAGNOSIS — R079 Chest pain, unspecified: Secondary | ICD-10-CM

## 2024-06-23 LAB — BASIC METABOLIC PANEL WITH GFR
Anion gap: 11 (ref 5–15)
BUN: 12 mg/dL (ref 6–20)
CO2: 22 mmol/L (ref 22–32)
Calcium: 9.8 mg/dL (ref 8.9–10.3)
Chloride: 106 mmol/L (ref 98–111)
Creatinine, Ser: 0.95 mg/dL (ref 0.44–1.00)
GFR, Estimated: >60 mL/min
Glucose, Bld: 95 mg/dL (ref 70–99)
Potassium: 3.3 mmol/L — ABNORMAL LOW (ref 3.5–5.1)
Sodium: 138 mmol/L (ref 135–145)

## 2024-06-23 LAB — CBC
HCT: 41.7 % (ref 36.0–46.0)
Hemoglobin: 13.9 g/dL (ref 12.0–15.0)
MCH: 28 pg (ref 26.0–34.0)
MCHC: 33.3 g/dL (ref 30.0–36.0)
MCV: 83.9 fL (ref 80.0–100.0)
Platelets: 280 K/uL (ref 150–400)
RBC: 4.97 MIL/uL (ref 3.87–5.11)
RDW: 13.8 % (ref 11.5–15.5)
WBC: 8.8 K/uL (ref 4.0–10.5)
nRBC: 0 % (ref 0.0–0.2)

## 2024-06-23 LAB — TROPONIN T, HIGH SENSITIVITY: Troponin T High Sensitivity: <15 ng/L (ref 0–19)

## 2024-06-23 NOTE — ED Provider Triage Note (Signed)
 Emergency Medicine Provider Triage Evaluation Note  Laurie Bell Bud , a 44 y.o. female  was evaluated in triage.  Pt complains of cp.  Has been going on intermittently for 4 days.  It is left-sided at times radiates to the left arm.  Denies OCP use.  Review of Systems  Positive: See above Negative: See above  Physical Exam  BP 128/71   Pulse 80   Temp 98.8 F (37.1 C)   Resp 16   SpO2 100%  Gen:   Awake, no distress   Resp:  Normal effort  MSK:   Moves extremities without difficulty  Other:    Medical Decision Making  Medically screening exam initiated at 4:01 PM.  Appropriate orders placed.  [name] was informed that the remainder of the evaluation will be completed by another provider, this initial triage assessment does not replace that evaluation, and the importance of remaining in the ED until their evaluation is complete.  Work up started   Lang Norleen POUR, PA-C 06/23/24 1601

## 2024-06-23 NOTE — ED Triage Notes (Addendum)
 Patient c/o intermittent chest pain x 4 days. Patient report pain radiates to left arm and tingling sensation on her left leg. Patient denies N/V. Patient denies SOB.

## 2024-06-24 LAB — TROPONIN T, HIGH SENSITIVITY: Troponin T High Sensitivity: <15 ng/L (ref 0–19)

## 2024-06-24 LAB — HCG, SERUM, QUALITATIVE: Preg, Serum: NEGATIVE

## 2024-06-24 LAB — D-DIMER, QUANTITATIVE: D-Dimer, Quant: <0.27 ug{FEU}/mL (ref 0.00–0.50)

## 2024-06-24 MED ORDER — SUCRALFATE 1 GM/10ML PO SUSP: 1.0000 g | Freq: Four times a day (QID) | ORAL | 0 refills | Status: AC

## 2024-06-24 MED ORDER — ALUM & MAG HYDROXIDE-SIMETH 200-200-20 MG/5ML PO SUSP
30.0000 mL | Freq: Once | ORAL | Status: AC
  Administered 2024-06-24: 30 mL via ORAL
  Filled 2024-06-24: qty 30

## 2024-06-24 MED ORDER — POTASSIUM CHLORIDE CRYS ER 20 MEQ PO TBCR
40.0000 meq | EXTENDED_RELEASE_TABLET | Freq: Once | ORAL | Status: AC
  Administered 2024-06-24: 40 meq via ORAL
  Filled 2024-06-24: qty 2

## 2024-06-24 MED ORDER — LIDOCAINE VISCOUS HCL 2 % MT SOLN
15.0000 mL | Freq: Once | OROMUCOSAL | Status: AC
  Administered 2024-06-24: 15 mL via ORAL
  Filled 2024-06-24: qty 15

## 2024-06-24 NOTE — ED Provider Notes (Signed)
 " [place] EMERGENCY DEPARTMENT AT [facility] Provider Note   CSN: [id number] Arrival date & time: 06/23/24  1546     Patient presents with: Chest Pain   Laurie Bell is a 44 y.o. female.   44 year old female presents with complaint of left side chest pain, intermittent for the past 4 days, sharp in nature, occasionally radiates to left shoulder. Similar to prior GERD but has run out of her liquid medicine. Denies SHOB, nausea, diaphoresis. Nothing makes pain worse, no exertional symptoms. No lower extremity edema. Recently completed treatment for breast cancer. No pain at this time.         Prior to Admission medications  Medication Sig Start Date End Date Taking? Authorizing Provider  acetaminophen  (TYLENOL ) 500 MG tablet Take 1,000 mg by mouth every 6 (six) hours as needed for moderate pain.    [provider]  albuterol  (VENTOLIN  HFA) 108 (90 Base) MCG/ACT inhaler TAKE 2 PUFFS BY MOUTH EVERY 6 HOURS AS NEEDED FOR WHEEZE OR SHORTNESS OF BREATH 12/22/22   Newlin, Enobong, MD  AMBULATORY NON FORMULARY MEDICATION Medication Name: GI Cocktail: 270 mL viscous Xylocaine  2%, 270 mL Dicyclomine 10 mg/5 mL, 810 mL Mylanta/Maylox. Take 8-10 mL every 4-6 hours as needed for severe pain Dispense 900 mL 11/19/23   Federico Rosario BROCKS, MD  amitriptyline  (ELAVIL ) 50 MG tablet Take 1 tablet (50 mg total) by mouth at bedtime. 02/04/24   Vaslow, Zachary K, MD  clindamycin  (CLEOCIN ) 300 MG capsule Take 1 capsule (300 mg total) by mouth 2 (two) times daily. 11/20/23   Newlin, Enobong, MD  cyclobenzaprine  (FLEXERIL ) 10 MG tablet Take 1 tablet (10 mg total) by mouth 2 (two) times daily as needed for muscle spasms. TAKE 1 TABLET BY MOUTH AT BEDTIME AS NEEDED FOR MUSCLE SPASMS 11/20/23   Newlin, Enobong, MD  esomeprazole  (NEXIUM ) 40 MG capsule Take 1 capsule (40 mg total) by mouth 2 (two) times daily before a meal. 06/13/24   May, Deanna J, NP  famotidine  (PEPCID ) 40 MG tablet TAKE 1 TABLET BY  MOUTH EVERYDAY AT BEDTIME 04/07/24   McMichael, Bayley M, PA-C  fluticasone  (FLONASE ) 50 MCG/ACT nasal spray Place 2 sprays into both nostrils daily. 10/17/23   Danton Jon HERO, PA-C  gabapentin  (NEURONTIN ) 400 MG capsule TAKE 1 CAPSULE BY MOUTH IN AM AND NOON, AND 2 CAPSULE BY MOUTH AT BEDTIME 12/21/23   Odean Potts, MD  hydrOXYzine  (ATARAX ) 25 MG tablet TAKE 1 TABLET(25 MG) BY MOUTH THREE TIMES DAILY 11/20/23   Newlin, Enobong, MD  ipratropium (ATROVENT ) 0.03 % nasal spray Place into the nose. 12/20/22   [provider]  lansoprazole  (PREVACID ) 30 MG capsule TAKE 1 CAPSULE (30 MG TOTAL) BY MOUTH 2 (TWO) TIMES DAILY BEFORE A MEAL. 06/16/24   May, Deanna J, NP  linaclotide  (LINZESS ) 145 MCG CAPS capsule Take 1 capsule (145 mcg total) by mouth daily before breakfast. Patient taking differently: Take 145 mcg by mouth as needed. 05/19/22   Federico Rosario BROCKS, MD  methimazole  (TAPAZOLE ) 5 MG tablet Take 1 tablet (5 mg total) by mouth as directed. 1 tablet Monday through Thursday, skip rest of the week 10/02/23   Shamleffer, Ibtehal Jaralla, MD  metoCLOPramide  (REGLAN ) 10 MG tablet Take 1 tablet (10 mg total) by mouth every 6 (six) hours. 12/18/23   Melvenia Motto, MD  metoprolol  succinate (TOPROL -XL) 25 MG 24 hr tablet Take 0.5 tablets (12.5 mg total) by mouth daily. For increased heart rate 11/20/23  Newlin, Enobong, MD  metroNIDAZOLE  (FLAGYL ) 500 MG tablet Take 500 mg by mouth 2 (two) times daily. 02/08/24   [provider]  naloxone [facility]) nasal spray 4 mg/0.1 mL 1 (ONE) SPRAY AS NEEDED 07/13/23   [provider]  oxyCODONE -acetaminophen  (PERCOCET) 10-325 MG tablet Take 1 tablet by mouth 4 (four) times daily as needed. 02/08/24   [provider]  potassium chloride  (KLOR-CON ) 10 MEQ tablet Take 1 tablet (10 mEq total) by mouth daily. 01/24/24   Newlin, Enobong, MD  rizatriptan  (MAXALT ) 10 MG tablet Take 1 tablet (10 mg total) by mouth as needed for migraine. May repeat in 2 hours if  needed 11/20/23   Newlin, Enobong, MD  sucralfate  (CARAFATE ) 1 GM/10ML suspension Take 10 mLs (1 g total) by mouth 4 (four) times daily. Take 2 teaspoons with each meal and 2 teaspoons at bedtime. Do not take within 2 hours of any other medications 08/09/23   Cara Elida HERO, NP  topiramate  (TOPAMAX ) 50 MG tablet Take 1 tablet (50 mg total) by mouth 2 (two) times daily. 11/20/23   Newlin, Enobong, MD  UNABLE TO FIND Med Name: Lido 2%/Dicyclo/Antacid 113 susp    [provider]  Vitamin D , Ergocalciferol , (DRISDOL) 1.25 MG (50000 UNIT) CAPS capsule Take 50,000 Units by mouth once a week. 07/19/23   [provider]  zolpidem  (AMBIEN ) 5 MG tablet Take 1 tablet (5 mg total) by mouth at bedtime as needed. for sleep 06/13/24   Crawford Morna Pickle, NP    Allergies: Latex and Tramadol     Review of Systems Negative except as per HPI Updated Vital Signs BP (!) 131/96 (BP Location: Left Arm)   Pulse 80   Temp 98 F (36.7 C) (Oral)   Resp 18   SpO2 100%   Physical Exam Vitals and nursing note reviewed.  Constitutional:      General: She is not in acute distress.    Appearance: She is well-developed. She is not diaphoretic.  HENT:     Head: Normocephalic and atraumatic.  Cardiovascular:     Rate and Rhythm: Normal rate and regular rhythm.     Heart sounds: Normal heart sounds.  Pulmonary:     Effort: Pulmonary effort is normal.     Breath sounds: Normal breath sounds.  Chest:     Chest wall: No tenderness.  Abdominal:     Palpations: Abdomen is soft.     Tenderness: There is no abdominal tenderness.  Musculoskeletal:     Cervical back: Neck supple.     Right lower leg: No tenderness. No edema.     Left lower leg: No tenderness. No edema.  Skin:    General: Skin is warm and dry.     Findings: No erythema or rash.  Neurological:     Mental Status: She is alert and oriented to person, place, and time.  Psychiatric:        Behavior: Behavior normal.      (all labs ordered are listed, but only abnormal results are displayed) Labs Reviewed  BASIC METABOLIC PANEL WITH GFR - Abnormal; Notable for the following components:      Result Value   Potassium 3.3 (*)    All other components within normal limits  CBC  HCG, SERUM, QUALITATIVE  D-DIMER, QUANTITATIVE  TROPONIN T, HIGH SENSITIVITY  TROPONIN T, HIGH SENSITIVITY    EKG: None  Radiology: [facility] Chest Port 1 View Result Date: 06/23/2024 CLINICAL DATA:  Intermittent chest pain for 4 days,  pain radiating to left arm EXAM: PORTABLE CHEST 1 VIEW COMPARISON:  01/30/2024 FINDINGS: The heart size and mediastinal contours are within normal limits. Both lungs are clear. The visualized skeletal structures are unremarkable. IMPRESSION: No active disease. Electronically Signed   By: Ozell Daring M.D.   On: 06/23/2024 17:18     Procedures   Medications Ordered in the ED  potassium chloride  SA (KLOR-CON  M) CR tablet 40 mEq (has no administration in time range)  alum & mag hydroxide-simeth (MAALOX/MYLANTA) 200-200-20 MG/5ML suspension 30 mL (has no administration in time range)    And  lidocaine  (XYLOCAINE ) 2 % viscous mouth solution 15 mL (has no administration in time range)                                    Medical Decision Making Amount and/or Complexity of Data Reviewed Labs: ordered.  Risk OTC drugs. Prescription drug management.   This patient presents to the ED for concern of chest pain, this involves an extensive number of treatment options, and is a complaint that carries with it a high risk of complications and morbidity.  The differential diagnosis includes but not limited to GERD, ACS, PE   Co morbidities / Chronic conditions that complicate the patient evaluation  GERD, breast cancer, hyperthyroid   Additional history obtained:  Additional history obtained from EMR External records from outside source obtained and reviewed including prior labs and imaging     Lab Tests:  I Ordered, and personally interpreted labs.  The pertinent results include:  hcg negative. CBC WNL. BMP with mild hypokalemia, troponin negative x 2. Dimer negative.    Imaging Studies ordered:  I ordered imaging studies including CXR  I independently visualized and interpreted imaging which showed no acute process  I agree with the radiologist interpretation   Cardiac Monitoring: / EKG:  The patient was maintained on a cardiac monitor.  I personally viewed and interpreted the cardiac monitored which showed an underlying rhythm of: sinus rhythm, rate 87   Problem List / ED Course / Critical interventions / Medication management  44 year old female with complaint of left side CP, intermittent for 4 days, no pain currently. Work up reassuring including trop negative x 2, dimer negative. CXR normal, EKG non ischemic. Provided with k dur for mild hypok and GI cocktail. Recently started on abx for sinusitis, question if this is aggravating her GERD. Recommend starting probiotic.  I ordered medication including k dur, GI cocktail    Reevaluation of the patient after these medicines showed that the patient remained stable  I have reviewed the patients home medicines and have made adjustments as needed   Social Determinants of Health:  Has PCP    Test / Admission - Considered:  Stable for dc      Final diagnoses:  None    ED Discharge Orders     None          Beverley Leita LABOR, PA-C 06/24/24 0428    Bari Charmaine FALCON, MD 06/24/24 [phone]  "

## 2024-06-24 NOTE — Discharge Instructions (Signed)
 Follow up with your care team. Return to the ER for worsening or concerning symptoms.   Recommend a probiotic such as Culturelle. Sometimes antibiotics can make reflux symptoms worse and a probiotic can help with this.

## 2024-06-27 ENCOUNTER — Telehealth: Payer: Self-pay | Admitting: Internal Medicine

## 2024-06-27 NOTE — Telephone Encounter (Signed)
 Incoming call from pt regarding rx refill for AMBULATORY NON FORMULARY MEDICATION 11/2023. Pt stated her pharmacy denied refill. Please advise. Thank you.

## 2024-06-27 NOTE — Telephone Encounter (Signed)
 Verbal given for 90ml 2% lodicaine 90ml bentyl and 270ml Maalox 400mg  take 30ml every 6 hours as needed with mike at [facility]

## 2024-06-27 NOTE — Telephone Encounter (Signed)
 Dr Federico please advise about this medication. Looks like the Gi cocktail

## 2024-06-30 ENCOUNTER — Ambulatory Visit: Admitting: Gastroenterology

## 2024-06-30 NOTE — Progress Notes (Deleted)
 Laurie Bell

## 2024-07-03 ENCOUNTER — Encounter: Payer: Self-pay | Admitting: Adult Health

## 2024-07-15 ENCOUNTER — Telehealth: Payer: Self-pay | Admitting: Family Medicine

## 2024-07-15 ENCOUNTER — Inpatient Hospital Stay: Admitting: Adult Health

## 2024-07-15 ENCOUNTER — Other Ambulatory Visit: Payer: Self-pay

## 2024-07-15 ENCOUNTER — Other Ambulatory Visit (HOSPITAL_COMMUNITY): Payer: Self-pay

## 2024-07-15 DIAGNOSIS — Z171 Estrogen receptor negative status [ER-]: Secondary | ICD-10-CM

## 2024-07-15 DIAGNOSIS — Z9189 Other specified personal risk factors, not elsewhere classified: Secondary | ICD-10-CM

## 2024-07-15 NOTE — Telephone Encounter (Signed)
 Pt confirmed appt

## 2024-07-16 ENCOUNTER — Ambulatory Visit: Payer: Self-pay

## 2024-07-16 ENCOUNTER — Encounter: Payer: Self-pay | Admitting: Family Medicine

## 2024-07-16 ENCOUNTER — Ambulatory Visit: Admitting: Family Medicine

## 2024-07-16 VITALS — BP 112/76 | HR 88 | Temp 97.9°F | Ht 66.0 in | Wt 180.0 lb

## 2024-07-16 DIAGNOSIS — M940 Chondrocostal junction syndrome [Tietze]: Secondary | ICD-10-CM

## 2024-07-16 DIAGNOSIS — F419 Anxiety disorder, unspecified: Secondary | ICD-10-CM

## 2024-07-16 DIAGNOSIS — Z853 Personal history of malignant neoplasm of breast: Secondary | ICD-10-CM

## 2024-07-16 DIAGNOSIS — R319 Hematuria, unspecified: Secondary | ICD-10-CM

## 2024-07-16 DIAGNOSIS — G44209 Tension-type headache, unspecified, not intractable: Secondary | ICD-10-CM

## 2024-07-16 DIAGNOSIS — R10A2 Flank pain, left side: Secondary | ICD-10-CM | POA: Diagnosis not present

## 2024-07-16 DIAGNOSIS — F32A Depression, unspecified: Secondary | ICD-10-CM

## 2024-07-16 DIAGNOSIS — E876 Hypokalemia: Secondary | ICD-10-CM

## 2024-07-16 DIAGNOSIS — G4709 Other insomnia: Secondary | ICD-10-CM | POA: Diagnosis not present

## 2024-07-16 DIAGNOSIS — M5412 Radiculopathy, cervical region: Secondary | ICD-10-CM | POA: Diagnosis not present

## 2024-07-16 DIAGNOSIS — R002 Palpitations: Secondary | ICD-10-CM

## 2024-07-16 LAB — POCT URINALYSIS DIP (CLINITEK)
Bilirubin, UA: NEGATIVE
Glucose, UA: NEGATIVE mg/dL
Ketones, POC UA: NEGATIVE mg/dL
Leukocytes, UA: NEGATIVE
Nitrite, UA: NEGATIVE
POC PROTEIN,UA: 30 — AB
Spec Grav, UA: 1.025
Urobilinogen, UA: 0.2 U/dL
pH, UA: 6.5

## 2024-07-16 MED ORDER — METOPROLOL SUCCINATE ER 25 MG PO TB24: 12.5000 mg | ORAL_TABLET | Freq: Every day | ORAL | 1 refills | Status: AC

## 2024-07-16 MED ORDER — DICLOFENAC SODIUM 1 % EX GEL: 4.0000 g | Freq: Four times a day (QID) | CUTANEOUS | 1 refills | Status: AC

## 2024-07-16 MED ORDER — FLUOXETINE HCL 20 MG PO CAPS: 20.0000 mg | ORAL_CAPSULE | Freq: Every day | ORAL | 1 refills | Status: AC

## 2024-07-16 MED ORDER — RIZATRIPTAN BENZOATE 10 MG PO TABS: 10.0000 mg | ORAL_TABLET | ORAL | 2 refills | Status: AC | PRN

## 2024-07-16 MED ORDER — HYDROXYZINE HCL 25 MG PO TABS: ORAL_TABLET | ORAL | 1 refills | Status: AC

## 2024-07-16 MED ORDER — TOPIRAMATE 50 MG PO TABS: 50.0000 mg | ORAL_TABLET | Freq: Two times a day (BID) | ORAL | 1 refills | Status: AC

## 2024-07-16 MED ORDER — CYCLOBENZAPRINE HCL 10 MG PO TABS: 10.0000 mg | ORAL_TABLET | Freq: Two times a day (BID) | ORAL | 1 refills | Status: AC | PRN

## 2024-07-16 MED ORDER — FLUCONAZOLE 150 MG PO TABS: 150.0000 mg | ORAL_TABLET | Freq: Once | ORAL | 0 refills | Status: AC

## 2024-07-16 NOTE — Telephone Encounter (Signed)
 Addressed.

## 2024-07-16 NOTE — Telephone Encounter (Signed)
 FYI Only or Action Required?: Action required by provider: clinical question for provider and update on patient condition.  Patient was last seen in primary care on 07/16/2024 by Newlin, Enobong, MD.  Called Nurse Triage reporting Medication Problem.   Triage Disposition: Call PCP Now  Patient/caregiver understands and will follow disposition?: Yes     Copied from CRM [id number]. Topic: Clinical - Prescription Issue >> Jul 16, 2024  2:20 PM Sophia H wrote: Reason for CRM: Patient calling in regarding medication for an upper respiratory infection she went to [facility] for. Was given antibiotics but nothing for yeast infection (patient normally gets yeast infections when she is on antibiotics). When she came into clinic and spoke with nurse the nurse advised they would let Dr. Delbert know to send in a medication for her and she was at the pharmacy during call for pick up but that medication was never received. Spoke with CAL and nurse is busy in clinic, per front desk the patient did not have UTI/Yeast infection (clarified- I asked about yeast infection not UTI) - CAL stated only kidney stones which she is being referred out for.   Patient is needing medication to treat a yeast infection that she has after taking antibiotics.   [id number] - [place], [place] - [address] AT [address]   Reason for Disposition  [1] Prescription not at pharmacy AND [2] was prescribed by doctor (or NP/PA) recently  (Exception: [facility] has access to EMR and prescription is recorded there. Go to Home Care and confirm prescription for pharmacy.)  Answer Assessment - Initial Assessment Questions 1. DRUG NAME: What medicine do you need to have refilled?     Pt is calling to f/u on rx that was discussed during visit today with PCP. Pt has attempted to contact clinic and messaged provider via my chart. Staff aware, will send as duplicate request.  Protocols used: Medication Refill and Renewal Call-A-AH

## 2024-07-16 NOTE — Telephone Encounter (Signed)
 Patient requesting Diflucan  pill

## 2024-07-16 NOTE — Telephone Encounter (Signed)
 Copied from CRM [id number]. Topic: Clinical - Medication Question >> Jul 16, 2024  2:14 PM   Joesph B wrote:  Reason for CRM: patient requested yeast infection medicine and was told at her appt , it will be sent in. She is at the pharmacy and they don't have It. Please fu.

## 2024-07-16 NOTE — Patient Instructions (Signed)
 Inflamed Tissue in the Chest Causing Pain (Costochondritis): What to Know  Costochondritis is irritation and swelling (inflammation) of the tissue that connects the ribs to the breastbone (sternum). This tissue is called cartilage. This condition causes pain in the front of the chest. The pain often starts slowly. It may be in more than one rib. What are the causes? The cause of this condition is not always known. It can come from stress on the sternum. The cause of this stress could be: Chest injury. Exercise or activity. This may include lifting. Very bad coughing. What increases the risk? Being female. Being 23-96 years old. Starting a new exercise or work activity. Having low levels of vitamin D . Having a condition that makes you cough a lot. What are the signs or symptoms? Chest pain that: Starts slowly. It can be sharp or dull. Gets worse with deep breathing, coughing, or exercise. Gets better with rest. May be worse when you press on your ribs and breastbone. How is this treated? In most cases, this condition goes away on its own over time. You may need to take an NSAID, such as ibuprofen . This can help reduce pain. You may also need to: Rest and stay away from activities that make pain worse. Put heat or ice on the area that hurts. Do exercises to stretch your chest muscles. If these treatments do not help, your doctor may inject a medicine to numb the area. This can help relieve the pain. Follow these instructions at home: Managing pain, stiffness, and swelling     If told, put ice on the painful area. To do this: Put ice in a plastic bag. Place a towel between your skin and the bag. Leave the ice on for 20 minutes, 2-3 times a day. If told, put heat on the affected area. Do this as often as told by your doctor. Use the heat source that your doctor recommends, such as a moist heat pack or a heating pad. Place a towel between your skin and the heat source. Leave the heat  on for 20-30 minutes. If your skin turns bright red, take off the ice or heat right away to prevent skin damage. The risk of skin damage is higher if you cannot feel pain, heat, or cold. Activity Rest as told by your doctor. Do not do things that make your pain worse. This includes activities that use your chest, belly (abdomen), and side muscles. You may have to avoid lifting. Ask your doctor how much you can safely lift. Return to your normal activities when your doctor says that it is safe. General instructions Take over-the-counter and prescription medicines only as told by your doctor. Contact a doctor if: You have chills or a fever. Your pain does not go away or gets worse. You have a cough that does not go away. Get help right away if: You have a hard time breathing. You have very bad chest pain that does not get better with medicines, heat, or ice. These symptoms may be an emergency. Get help right away. Call 911. Do not wait to see if the symptoms will go away. Do not drive yourself to the hospital. This information is not intended to replace advice given to you by your health care provider. Make sure you discuss any questions you have with your health care provider. Document Revised: 04/04/2024 Document Reviewed: 12/15/2021 Elsevier Patient Education  2025 [employer].

## 2024-07-16 NOTE — Telephone Encounter (Signed)
"  My Chart message has been sent to patient  "

## 2024-07-16 NOTE — Progress Notes (Signed)
 "  Subjective:  Patient ID: Laurie Bell, female    DOB: [birth year]  Age: 44 y.o. MRN: [id number]  CC: Medical Management of Chronic Issues (Back and abdominal pain for 2 days/Discuss anxiety medication)     Discussed the use of AI scribe software for clinical note transcription with the patient, who gave verbal consent to proceed.  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  She has had left-sided back pain radiating anteriorly to the left upper abdomen for the past two days, it has been sore and tender in the upper abdomen. It was more diffuse initially but has localized. Pain is 8/10, intermittent, and worse with touch. She has increased gas and recent constipation and tried Miralax  without relief. She denies nausea, vomiting, and urinary symptoms.  She has anxiety managed with hydroxyzine . Effexor  previously caused excessive drowsiness that interfered with work, and she did not start amitriptyline  prescribed for headaches by her oncologist. She takes hydroxyzine  nightly, with two doses after work and one before bed, for anxiety and sleep. She also uses Ambien  for sleep from her oncologist but has recently ran out.  She informs me her oncologist had told her to obtain the refill from her PCP.  She has breast cancer treated with chemotherapy, complicated by neuropathy. She has intermittent pain and numbness in her legs and feet. She was prescribed gabapentin  but reports excessive sweating and fatigue and now uses a lower dose infrequently.  She takes Nexium  for reflux and metoprolol  at night for  palpitations. She uses Maxalt  and Topamax  for migraines, which control symptoms. She had low potassium in the past and is on potassium supplements, though she is unsure if the dose is adequate.  Last potassium level was 3.3 two weeks ago.    Past Medical History:  Diagnosis Date   Allergy    Anxiety    Arthritis    Breast cancer (HCC)    Chlamydia contact, treated    GERD (gastroesophageal reflux disease)    Hyperthyroidism    Personal history of chemotherapy    Personal history of radiation therapy     Past Surgical History:  Procedure Laterality Date   AXILLARY SENTINEL NODE BIOPSY Right 12/01/2022   Procedure: RIGHT AXILLARY SENTINEL NODE BIOPSY;  Surgeon: Vernetta Berg, MD;  Location: [facility];  Service: General;  Laterality: Right;   BREAST BIOPSY Right 10/02/2022   MM RT BREAST BX W LOC DEV 1ST LESION IMAGE BX SPEC STEREO GUIDE 10/02/2022 [facility]   BREAST BIOPSY  11/10/2022   MM RT RADIOACTIVE SEED LOC MAMMO GUIDE 11/10/2022 [facility]   BREAST LUMPECTOMY WITH RADIOACTIVE SEED LOCALIZATION Right 11/13/2022   Procedure: RIGHT BREAST LUMPECTOMY WITH RADIOACTIVE SEED LOCALIZATION;  Surgeon: Vernetta Berg, MD;  Location: [facility];  Service: General;  Laterality: Right;   ENDOMETRIAL ABLATION N/A 04/25/2017   Procedure: ENDOMETRIAL ABLATION With NOVASURE;  Surgeon: Eveline Lynwood MATSU, MD;  Location:  S[facility]ERY C[facility];  Service: Gynecology;  Laterality: N/A;   ESOPHAGEAL MANOMETRY N/A 07/27/2021   Procedure: ESOPHAGEAL MANOMETRY (EM);  Surgeon: Federico Rosario BROCKS, MD;  Location: [facility];  Service: Gastroenterology;  Laterality: N/A;   PORTACATH PLACEMENT  N/A 12/01/2022   Procedure: INSERTION PORT-A-CATH WITH ULTRASOUND GUIDANCE;  Surgeon: Vernetta Berg, MD;  Location: [facility];  Service: General;  Laterality: N/A;   RE-EXCISION OF BREAST CANCER,SUPERIOR MARGINS Right 12/01/2022   Procedure: RE-EXCISION OF RIGHT BREAST CANCER;  Surgeon:  Vernetta Berg, MD;  Location: [facility];  Service: General;  Laterality: Right;   TUBAL LIGATION  2003    Family History  Problem Relation Age of Onset   Hypertension Mother    Hypertension Maternal Aunt    Breast cancer Maternal Grandmother        dx > 50   Stomach cancer Maternal Grandfather        dx > 50   Breast cancer Paternal Grandmother        dx > 50   Esophageal cancer Neg Hx    Rectal cancer Neg Hx    Colon cancer Neg Hx     Social History   Socioeconomic History   Marital status: Significant Other    Spouse name: Not on file   Number of children: 2   Years of education: Not on file   Highest education level: Not on file  Occupational History   Occupation: Event Organiser: [employer]  Tobacco Use   Smoking status: Never   Smokeless tobacco: Never  Vaping Use   Vaping status: Never Used  Substance and Sexual Activity   Alcohol use: No   Drug use: Not Currently    Types: Marijuana    Comment: quit 2018   Sexual activity: Not Currently    Birth control/protection: Surgical    Comment: B.T. L  Other Topics Concern   Not on file  Social History Narrative   ** Merged History Encounter **       Right Handed  Lives in a one story home    Social Drivers of Health   Tobacco Use: Low Risk (07/16/2024)   Patient History    Smoking Tobacco Use: Never    Smokeless Tobacco Use: Never    Passive Exposure: Not on file  Recent Concern: Tobacco Use - Medium Risk (06/21/2024)   Received from [facility]   Patient History    Smoking Tobacco Use: Never    Smokeless Tobacco Use: Never    Passive Exposure: Current  Financial Resource Strain: Medium Risk (11/03/2021)   Overall Financial Resource Strain (CARDIA)    Difficulty of Paying Living Expenses: Somewhat hard  Food Insecurity: No Food Insecurity (09/27/2023)   Hunger Vital Sign    Worried About Running Out of Food in the Last Year: Never true    Ran Out of Food in the Last Year: Never true   Transportation Needs: No Transportation Needs (09/27/2023)   PRAPARE - Administrator, Civil Service (Medical): No    Lack of Transportation (Non-Medical): No  Physical Activity: Sufficiently Active (11/03/2021)   Exercise Vital Sign    Days of Exercise per Week: 3 days    Minutes of Exercise per Session: 50 min  Stress: Stress Concern Present (11/03/2021)   [employer] of Occupational Health - Occupational Stress Questionnaire    Feeling of Stress : Very much  Social Connections: Moderately Isolated (11/03/2021)   Social Connection and Isolation Panel    Frequency of Communication with Friends and Family: Once a week    Frequency of Social Gatherings with Friends and Family: Once a week    Attends Religious Services: More than 4 times per year    Active  Member of Clubs or Organizations: No    Attends Banker Meetings: Never    Marital Status: Living with partner  Depression (PHQ2-9): High Risk (07/16/2024)   Depression (PHQ2-9)    PHQ-2 Score: 11  Alcohol Screen: Low Risk (11/03/2021)   Alcohol Screen    Last Alcohol Screening Score (AUDIT): 0  Housing: Low Risk (09/27/2023)   Housing Stability Vital Sign    Unable to Pay for Housing in the Last Year: No    Number of Times Moved in the Last Year: 0    Homeless in the Last Year: No  Utilities: Not At Risk (09/27/2023)   [facility] Utilities    Threatened with loss of utilities: No  Health Literacy: Not on file    Allergies[1]  Outpatient Medications Prior to Visit  Medication Sig Dispense Refill   acetaminophen  (TYLENOL ) 500 MG tablet Take 1,000 mg by mouth every 6 (six) hours as needed for moderate pain.     albuterol  (VENTOLIN  HFA) 108 (90 Base) MCG/ACT inhaler TAKE 2 PUFFS BY MOUTH EVERY 6 HOURS AS NEEDED FOR WHEEZE OR SHORTNESS OF BREATH 54 each 0   AMBULATORY NON FORMULARY MEDICATION Medication Name: GI Cocktail: 270 mL viscous Xylocaine  2%, 270 mL Dicyclomine 10 mg/5 mL, 810 mL Mylanta/Maylox. Take 8-10  mL every 4-6 hours as needed for severe pain Dispense 900 mL 900 m 1   clindamycin  (CLEOCIN ) 300 MG capsule Take 1 capsule (300 mg total) by mouth 2 (two) times daily. 20 capsule 0   esomeprazole  (NEXIUM ) 40 MG capsule Take 1 capsule (40 mg total) by mouth 2 (two) times daily before a meal. 60 capsule 4   famotidine  (PEPCID ) 40 MG tablet TAKE 1 TABLET BY MOUTH EVERYDAY AT BEDTIME 30 tablet 3   fluticasone  (FLONASE ) 50 MCG/ACT nasal spray Place 2 sprays into both nostrils daily. 16 g 6   gabapentin  (NEURONTIN ) 400 MG capsule TAKE 1 CAPSULE BY MOUTH IN AM AND NOON, AND 2 CAPSULE BY MOUTH AT BEDTIME 56 capsule 0   ipratropium (ATROVENT ) 0.03 % nasal spray Place into the nose.     lansoprazole  (PREVACID ) 30 MG capsule TAKE 1 CAPSULE (30 MG TOTAL) BY MOUTH 2 (TWO) TIMES DAILY BEFORE A MEAL. 60 capsule 1   linaclotide  (LINZESS ) 145 MCG CAPS capsule Take 1 capsule (145 mcg total) by mouth daily before breakfast. (Patient taking differently: Take 145 mcg by mouth as needed.) 30 capsule 5   methimazole  (TAPAZOLE ) 5 MG tablet Take 1 tablet (5 mg total) by mouth as directed. 1 tablet Monday through Thursday, skip rest of the week 52 tablet 3   metoCLOPramide  (REGLAN ) 10 MG tablet Take 1 tablet (10 mg total) by mouth every 6 (six) hours. 30 tablet 0   metroNIDAZOLE  (FLAGYL ) 500 MG tablet Take 500 mg by mouth 2 (two) times daily.     naloxone (NARCAN) nasal spray 4 mg/0.1 mL 1 (ONE) SPRAY AS NEEDED     oxyCODONE -acetaminophen  (PERCOCET) 10-325 MG tablet Take 1 tablet by mouth 4 (four) times daily as needed.     potassium chloride  (KLOR-CON ) 10 MEQ tablet Take 1 tablet (10 mEq total) by mouth daily. 90 tablet 1   UNABLE TO FIND Med Name: Lido 2%/Dicyclo/Antacid 113 susp     Vitamin D , Ergocalciferol , (DRISDOL) 1.25 MG (50000 UNIT) CAPS capsule Take 50,000 Units by mouth once a week.     zolpidem  (AMBIEN ) 5 MG tablet Take 1 tablet (5 mg total) by mouth at bedtime as needed. for sleep 30  tablet 3   amitriptyline   (ELAVIL ) 50 MG tablet Take 1 tablet (50 mg total) by mouth at bedtime. 30 tablet 2   cyclobenzaprine  (FLEXERIL ) 10 MG tablet Take 1 tablet (10 mg total) by mouth 2 (two) times daily as needed for muscle spasms. TAKE 1 TABLET BY MOUTH AT BEDTIME AS NEEDED FOR MUSCLE SPASMS 180 tablet 1   hydrOXYzine  (ATARAX ) 25 MG tablet TAKE 1 TABLET(25 MG) BY MOUTH THREE TIMES DAILY 270 tablet 1   metoprolol  succinate (TOPROL -XL) 25 MG 24 hr tablet Take 0.5 tablets (12.5 mg total) by mouth daily. For increased heart rate 45 tablet 1   rizatriptan  (MAXALT ) 10 MG tablet Take 1 tablet (10 mg total) by mouth as needed for migraine. May repeat in 2 hours if needed 10 tablet 2   topiramate  (TOPAMAX ) 50 MG tablet Take 1 tablet (50 mg total) by mouth 2 (two) times daily. 180 tablet 1   sucralfate  (CARAFATE ) 1 GM/10ML suspension Take 10 mLs (1 g total) by mouth 4 (four) times daily. Take 2 teaspoons with each meal and 2 teaspoons at bedtime. Do not take within 2 hours of any other medications (Patient not taking: Reported on 07/16/2024) 400 mL 0   No facility-administered medications prior to visit.     ROS Review of Systems  Constitutional:  Negative for activity change and appetite change.  HENT:  Negative for sinus pressure and sore throat.   Respiratory:  Negative for chest tightness, shortness of breath and wheezing.   Cardiovascular:  Negative for chest pain and palpitations.  Gastrointestinal:  Positive for abdominal pain and constipation. Negative for abdominal distention.  Genitourinary: Negative.   Musculoskeletal:  Positive for back pain.  Psychiatric/Behavioral:  Positive for sleep disturbance. Negative for behavioral problems and dysphoric mood.        Positive for anxiety    Objective:  BP 112/76   Pulse 88   Temp 97.9 F (36.6 C) (Oral)   Ht 5' 6 (1.676 m)   Wt 180 lb (81.6 kg)   SpO2 100%   BMI 29.05 kg/m      07/16/2024    8:33 AM 06/24/2024    4:12 AM 06/23/2024   11:34 PM  BP/Weight   Systolic BP 112 137 131  Diastolic BP 76 98 96  Wt. (Lbs) 180    BMI 29.05 kg/m2        Physical Exam Constitutional:      Appearance: She is well-developed.  Cardiovascular:     Rate and Rhythm: Normal rate.     Heart sounds: Normal heart sounds. No murmur heard. Pulmonary:     Effort: Pulmonary effort is normal.     Breath sounds: Normal breath sounds. No wheezing or rales.  Chest:     Chest wall: No tenderness.  Abdominal:     General: Bowel sounds are normal. There is no distension.     Palpations: Abdomen is soft. There is no mass.     Tenderness: There is no abdominal tenderness. There is no right CVA tenderness or left CVA tenderness.  Musculoskeletal:        General: Normal range of motion.     Right lower leg: No edema.     Left lower leg: No edema.     Comments: TTP of left subcostal region extending from posterior lowest rib towards anterior border  Neurological:     Mental Status: She is alert and oriented to person, place, and time.  Psychiatric:  Mood and Affect: Mood normal.        Latest Ref Rng & Units 06/23/2024   10:03 PM 02/15/2024    8:45 AM 01/30/2024    7:22 PM  CMP  Glucose 70 - 99 mg/dL 95  882  93   BUN 6 - 20 mg/dL 12  11  10    Creatinine 0.44 - 1.00 mg/dL 9.04  9.03  9.11   Sodium 135 - 145 mmol/L 138  138  138   Potassium 3.5 - 5.1 mmol/L 3.3  3.7  3.3   Chloride 98 - 111 mmol/L 106  104  106   CO2 22 - 32 mmol/L 22  22  21    Calcium  8.9 - 10.3 mg/dL 9.8  9.6  9.2     Lipid Panel     Component Value Date/Time   CHOL 176 11/20/2023 0945   TRIG 163 (H) 11/20/2023 0945   HDL 28 (L) 11/20/2023 0945   LDLCALC 119 (H) 11/20/2023 0945    CBC    Component Value Date/Time   WBC 8.8 06/23/2024 2203   RBC 4.97 06/23/2024 2203   HGB 13.9 06/23/2024 2203   HGB 12.6 05/08/2023 1121   HCT 41.7 06/23/2024 2203   PLT 280 06/23/2024 2203   PLT 238 05/08/2023 1121   MCV 83.9 06/23/2024 2203   MCH 28.0 06/23/2024 2203   MCHC 33.3  06/23/2024 2203   RDW 13.8 06/23/2024 2203   LYMPHSABS 2.8 02/15/2024 0845   MONOABS 0.6 02/15/2024 0845   EOSABS 0.2 02/15/2024 0845   BASOSABS 0.0 02/15/2024 0845    Lab Results  Component Value Date   HGBA1C 5.6 11/20/2023       Assessment & Plan Left flank pain/hematuria Urinalysis shows blood, ruled out infection. - Ordered CT scan to check for kidney stones.   Costochondritis Pain likely due to costochondritis, supported by examination findings. . - Prescribed Voltaren  gel for costochondritis. - Provided information on costochondritis.  Anxiety and depression Hydroxyzine  effective for anxiety but short-acting. Effexor  caused drowsiness. Prozac  chosen for long-term management. - Prescribed Prozac  20 mg daily. - Continue hydroxyzine  until Prozac  takes effect.  Insomnia Hydroxyzine  used for insomnia. Ambien  prescription issue noted. -Med list reveals prescription was written by oncology last month but it apparently printed and was not sent electronically - Advised contacting prescribing physician to ensure Ambien  prescription is received by pharmacy.  Chemotherapy-induced peripheral neuropathy Neuropathy worsened by cold. Gabapentin  use intermittently due to side effects. Cymbalta  not tolerated.   Tension-type headache Headaches effectively managed with Maxalt  and Topamax . - Continue Maxalt  and Topamax  as needed.  Palpitations Metoprolol  effective when taken regularly. - Refilled metoprolol  prescription.  Hypokalemia Previous potassium level low at 3.3 - Checked potassium level. - Will adjust potassium supplementation based on results.  History of breast cancer Recent follow-up shows no recurrence. Regular monitoring ongoing. - Continue regular follow-up and blood tests every six months. -CT DNA not detected in 06/2024   Cervical radiculopathy - Stable Remains on muscle relaxant  Meds ordered this encounter  Medications   FLUoxetine  (PROZAC ) 20 MG  capsule    Sig: Take 1 capsule (20 mg total) by mouth daily.    Dispense:  90 capsule    Refill:  1   cyclobenzaprine  (FLEXERIL ) 10 MG tablet    Sig: Take 1 tablet (10 mg total) by mouth 2 (two) times daily as needed for muscle spasms. TAKE 1 TABLET BY MOUTH AT BEDTIME AS NEEDED FOR MUSCLE SPASMS  Dispense:  180 tablet    Refill:  1   hydrOXYzine  (ATARAX ) 25 MG tablet    Sig: TAKE 1 TABLET(25 MG) BY MOUTH THREE TIMES DAILY    Dispense:  270 tablet    Refill:  1   metoprolol  succinate (TOPROL -XL) 25 MG 24 hr tablet    Sig: Take 0.5 tablets (12.5 mg total) by mouth daily. For increased heart rate    Dispense:  45 tablet    Refill:  1   rizatriptan  (MAXALT ) 10 MG tablet    Sig: Take 1 tablet (10 mg total) by mouth as needed for migraine. May repeat in 2 hours if needed    Dispense:  10 tablet    Refill:  2   topiramate  (TOPAMAX ) 50 MG tablet    Sig: Take 1 tablet (50 mg total) by mouth 2 (two) times daily.    Dispense:  180 tablet    Refill:  1   diclofenac  Sodium (VOLTAREN ) 1 % GEL    Sig: Apply 4 g topically 4 (four) times daily.    Dispense:  100 g    Refill:  1   fluconazole  (DIFLUCAN ) 150 MG tablet    Sig: Take 1 tablet (150 mg total) by mouth once for 1 dose.    Dispense:  1 tablet    Refill:  0    Follow-up: Return in about 6 months (around 01/13/2025) for Chronic medical conditions.       Corrina Sabin, MD, FAAFP. [facility] [place], [place] [phone]   07/16/2024, 5:18 PM     [1]  Allergies Allergen Reactions   Latex Other (See Comments)    Burn in vaginal area with latex condoms   Tramadol  Other (See Comments)    States it messes me up no further info   "

## 2024-07-17 ENCOUNTER — Ambulatory Visit: Payer: Self-pay | Admitting: Family Medicine

## 2024-07-17 LAB — POTASSIUM: Potassium: 3.8 mmol/L (ref 3.5–5.2)

## 2024-07-17 MED ORDER — POTASSIUM CHLORIDE ER 10 MEQ PO TBCR: 10.0000 meq | EXTENDED_RELEASE_TABLET | Freq: Every day | ORAL | 1 refills | Status: AC

## 2024-07-18 ENCOUNTER — Encounter: Admitting: Adult Health

## 2024-08-05 ENCOUNTER — Ambulatory Visit: Admitting: Family Medicine

## 2025-01-13 ENCOUNTER — Ambulatory Visit: Payer: Self-pay | Admitting: Family Medicine

## 2025-02-05 ENCOUNTER — Ambulatory Visit: Admitting: Adult Health
